# Patient Record
Sex: Male | Born: 1960
Health system: Southern US, Community
[De-identification: ages and names within clinical notes are randomized; demographics above are authoritative.]

## PROBLEM LIST (undated history)

## (undated) ENCOUNTER — Ambulatory Visit (HOSPITAL_COMMUNITY): Admission: EM | Source: Home / Self Care

## (undated) DIAGNOSIS — E119 Type 2 diabetes mellitus without complications: Secondary | ICD-10-CM

## (undated) DIAGNOSIS — E785 Hyperlipidemia, unspecified: Secondary | ICD-10-CM

## (undated) DIAGNOSIS — F17200 Nicotine dependence, unspecified, uncomplicated: Secondary | ICD-10-CM

## (undated) DIAGNOSIS — M549 Dorsalgia, unspecified: Secondary | ICD-10-CM

## (undated) DIAGNOSIS — R413 Other amnesia: Secondary | ICD-10-CM

## (undated) DIAGNOSIS — I10 Essential (primary) hypertension: Secondary | ICD-10-CM

## (undated) DIAGNOSIS — I779 Disorder of arteries and arterioles, unspecified: Secondary | ICD-10-CM

## (undated) HISTORY — DX: Nicotine dependence, unspecified, uncomplicated: F17.200

## (undated) HISTORY — DX: Dorsalgia, unspecified: M54.9

## (undated) HISTORY — DX: Disorder of arteries and arterioles, unspecified: I77.9

## (undated) HISTORY — DX: Other amnesia: R41.3

## (undated) HISTORY — DX: Hyperlipidemia, unspecified: E78.5

---

## 2001-02-19 ENCOUNTER — Emergency Department (HOSPITAL_COMMUNITY): Admission: EM | Admit: 2001-02-19 | Discharge: 2001-02-19 | Payer: Self-pay | Admitting: Emergency Medicine

## 2013-06-21 DIAGNOSIS — I779 Disorder of arteries and arterioles, unspecified: Secondary | ICD-10-CM

## 2013-06-21 HISTORY — DX: Disorder of arteries and arterioles, unspecified: I77.9

## 2015-09-07 ENCOUNTER — Encounter (HOSPITAL_COMMUNITY): Payer: Self-pay | Admitting: Emergency Medicine

## 2015-09-07 ENCOUNTER — Ambulatory Visit (HOSPITAL_COMMUNITY)
Admission: EM | Admit: 2015-09-07 | Discharge: 2015-09-07 | Disposition: A | Discharge status: Home / Self Care | Payer: PRIVATE HEALTH INSURANCE | Attending: Family Medicine | Admitting: Family Medicine

## 2015-09-07 DIAGNOSIS — S39012A Strain of muscle, fascia and tendon of lower back, initial encounter: Secondary | ICD-10-CM | POA: Diagnosis not present

## 2015-09-07 MED ORDER — METAXALONE 800 MG PO TABS: 800.0000 mg | ORAL_TABLET | Freq: Three times a day (TID) | ORAL | Status: DC

## 2015-09-07 NOTE — ED Notes (Signed)
Patient complaining of left mid to lower back pain.  No known injury.  Patient describes a lot of lifting at work.  Denies urinary symptoms.  No pcp

## 2015-09-07 NOTE — ED Provider Notes (Signed)
CSN: [id number]     Arrival date & time 09/07/15  1133 History   First MD Initiated Contact with Patient 09/07/15 1302     Chief Complaint  Patient presents with  . Back Pain   (Consider location/radiation/quality/duration/timing/severity/associated sxs/prior Treatment) Patient is a 55 y.o. male presenting with back pain. The history is provided by the patient and the spouse.  Back Pain Location:  Lumbar spine Quality:  Shooting Radiates to:  Does not radiate Pain severity:  Moderate Onset quality:  Gradual Duration:  1 month Progression:  Waxing and waning Chronicity:  Recurrent Context: lifting heavy objects   Relieved by:  None tried Worsened by:  Nothing tried Ineffective treatments:  None tried Associated symptoms: no abdominal pain, no dysuria, no fever, no numbness, no paresthesias, no pelvic pain, no perianal numbness and no tingling     History reviewed. No pertinent past medical history. History reviewed. No pertinent past surgical history. No family history on file. Social History  Substance Use Topics  . Smoking status: Current Every Day Smoker  . Smokeless tobacco: None  . Alcohol Use: No    Review of Systems  Constitutional: Negative for fever.  Gastrointestinal: Negative.  Negative for abdominal pain.  Genitourinary: Negative.  Negative for dysuria and pelvic pain.  Musculoskeletal: Positive for myalgias and back pain. Negative for joint swelling and gait problem.  Skin: Negative.   Neurological: Negative for tingling, numbness and paresthesias.  All other systems reviewed and are negative.   Allergies  Aspirin  Home Medications   Prior to Admission medications   Medication Sig Start Date End Date Taking? Authorizing Provider  OVER THE COUNTER MEDICATION Some beverage named "stackers, for energy"   Yes Historical Provider, MD  metaxalone (SKELAXIN) 800 MG tablet Take 1 tablet (800 mg total) by mouth 3 (three) times daily. For back pain 09/07/15    Linna HoffJames D Annalaura Sauseda, MD   Meds Ordered and Administered this Visit  Medications - No data to display  BP 147/82 mmHg  Pulse 67  Temp(Src) 98.6 F (37 C) (Oral)  Resp 16  SpO2 99% No data found.   Physical Exam  Constitutional: He is oriented to person, place, and time. He appears well-developed and well-nourished. No distress.  Abdominal: Soft. Bowel sounds are normal. He exhibits no mass. There is no tenderness.  Musculoskeletal: He exhibits tenderness.       Lumbar back: He exhibits tenderness, pain and spasm. He exhibits no bony tenderness, no swelling, no deformity and normal pulse.  Neurological: He is alert and oriented to person, place, and time.  Skin: Skin is warm and dry.  Nursing note and vitals reviewed.   ED Course  Procedures (including critical care time)  Labs Review Labs Reviewed - No data to display  Imaging Review No results found.   Visual Acuity Review  Right Eye Distance:   Left Eye Distance:   Bilateral Distance:    Right Eye Near:   Left Eye Near:    Bilateral Near:         MDM   1. Low back strain, initial encounter        Linna HoffJames D Jaslene Marsteller, MD 09/07/15 1319

## 2015-09-07 NOTE — Discharge Instructions (Signed)
Heat to back and medicine as needed, see orthopedist if problem continues.

## 2016-06-21 HISTORY — PX: COLONOSCOPY: SHX174

## 2016-06-21 HISTORY — PX: NO PAST SURGERIES: SHX2092

## 2016-06-24 ENCOUNTER — Ambulatory Visit (INDEPENDENT_AMBULATORY_CARE_PROVIDER_SITE_OTHER): Payer: PRIVATE HEALTH INSURANCE | Admitting: Medical

## 2016-06-24 ENCOUNTER — Ambulatory Visit: Payer: Self-pay | Admitting: Medical

## 2016-06-24 ENCOUNTER — Encounter: Payer: Self-pay | Admitting: Medical

## 2016-06-24 VITALS — BP 130/86 | HR 64 | Ht 69.0 in | Wt 155.8 lb

## 2016-06-24 DIAGNOSIS — R634 Abnormal weight loss: Secondary | ICD-10-CM | POA: Diagnosis not present

## 2016-06-24 DIAGNOSIS — M79672 Pain in left foot: Secondary | ICD-10-CM | POA: Diagnosis not present

## 2016-06-24 DIAGNOSIS — Z125 Encounter for screening for malignant neoplasm of prostate: Secondary | ICD-10-CM | POA: Insufficient documentation

## 2016-06-24 DIAGNOSIS — I8390 Asymptomatic varicose veins of unspecified lower extremity: Secondary | ICD-10-CM | POA: Diagnosis not present

## 2016-06-24 DIAGNOSIS — M79605 Pain in left leg: Secondary | ICD-10-CM | POA: Diagnosis not present

## 2016-06-24 DIAGNOSIS — R0989 Other specified symptoms and signs involving the circulatory and respiratory systems: Secondary | ICD-10-CM

## 2016-06-24 DIAGNOSIS — F17200 Nicotine dependence, unspecified, uncomplicated: Secondary | ICD-10-CM

## 2016-06-24 LAB — CBC WITH DIFFERENTIAL/PLATELET
Basophils Absolute: 0 cells/uL (ref 0–200)
Basophils Relative: 0 %
EOS ABS: 80 {cells}/uL (ref 15–500)
Eosinophils Relative: 1 %
HEMATOCRIT: 40.6 % (ref 38.5–50.0)
Hemoglobin: 14.3 g/dL (ref 13.2–17.1)
LYMPHS PCT: 17 %
Lymphs Abs: 1360 cells/uL (ref 850–3900)
MCH: 29.5 pg (ref 27.0–33.0)
MCHC: 35.2 g/dL (ref 32.0–36.0)
MCV: 83.7 fL (ref 80.0–100.0)
MONO ABS: 480 {cells}/uL (ref 200–950)
MPV: 9.6 fL (ref 7.5–12.5)
Monocytes Relative: 6 %
NEUTROS PCT: 76 %
Neutro Abs: 6080 cells/uL (ref 1500–7800)
Platelets: 217 10*3/uL (ref 140–400)
RBC: 4.85 MIL/uL (ref 4.20–5.80)
RDW: 14.1 % (ref 11.0–15.0)
WBC: 8 10*3/uL (ref 4.0–10.5)

## 2016-06-24 NOTE — Patient Instructions (Signed)
Encounter Diagnoses  Name Primary?  Marland Kitchen. Foot pain, left Yes  . Left leg pain   . Abnormal weight loss   . Heavy smoker   . Varicose vein of leg   . Decreased pedal pulses   . Screening for prostate cancer     Foot and leg pain  I suspect your blood flow to the left leg is reduced causing pain  However the pain could be nerve damage related or related to varicose veins/phlebitis  I do recommend you continue routine walking for exercise  I STRONGLY RECOMMEND YOU QUIT SMOKING!!!  Unintentional weight loss  When someone is losing weight for no good reason, we get a little worried  Thus I want to send you for a chest xray and do some labs to help screen for cause of weight loss  We will call with lab results, and we will schedule the blood pressure study in the legs

## 2016-06-24 NOTE — Progress Notes (Signed)
Subjective:     Patient ID: Benjamin [name], male   DOB: 1960-06-04, 56 y.o.   MRN: [id number]  HPI  Chief Complaint  Patient presents with  . leg pain    leg pain at night  , x3 months   Here as a new patient.  Hasn't been to a doctor in about 10 years.    Here mainly for pains in left leg.   When he lies down gets strain sensation in leg.  When he lies down at night, has to jump up out of bed.    Right leg doesn't give him problems, but he does have injury where he dropped something on the right foot years ago.  When walking does ok, but when still like at bedtime, gets pains throughout the night.  Sometimes burning/hot sensation.  Mostly ache/pain in calf and foot.   Sometimes the veins can be red.  He smokes 2ppd for at least the last 5 years.   No injury or trauma to left leg.  Uses OTC Doan? Pain medication for the pain.  Sometimes left foot feels numb.  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  Works in [employer].  Denies alcohol use and no hx/o alcohol abuse.   Lives with his sister who has MS.   She does not smoke.    He also notes unexplained 15 lb weight loss in last 4 months.  No blood in stool or urine, no dyspnea, no hemoptysis,no fever.  Eats 2-3 meals daily.   Past Medical History:  Diagnosis Date  . Back pain   . Heavy smoker     Past Surgical History:  Procedure Laterality Date  . COLONOSCOPY  06/2016   never  . NO PAST SURGERIES  06/2016    Social History   Social History  . Marital status: Single    Spouse name: N/A  . Number of children: N/A  . Years of education: N/A   Occupational History  . Not on file.   Social History Main Topics  . Smoking status: Current Every Day Smoker    Packs/day: 2.00  . Smokeless tobacco: Never Used  . Alcohol use No  . Drug use: No  . Sexual activity: Not on file   Other Topics Concern  . Not on file   Social History Narrative  . No narrative on file    Family History  Problem Relation Age of Onset  .  Arthritis Mother   . COPD Father   . Stroke Father   . Multiple sclerosis Sister      Current Outpatient Prescriptions:  .  metaxalone (SKELAXIN) 800 MG tablet, Take 1 tablet (800 mg total) by mouth 3 (three) times daily. For back pain (Patient not taking: Reported on 06/24/2016), Disp: 30 tablet, Rfl: 0  Allergies  Allergen Reactions  . Aspirin Nausea And Vomiting    Review of Systems     Objective:   Physical Exam BP 130/86   Pulse 64   Ht 5\' 9"  (1.753 m)   Wt 155 lb 12.8 oz (70.7 kg)   SpO2 97%   BMI 23.01 kg/m     General appearance: alert, no distress, WD/WN, lean AA male HEENT: normocephalic, sclerae anicteric, PERRLA, EOMi, nares patent, no discharge or erythema, pharynx normal Oral cavity: MMM Neck: supple, no lymphadenopathy, no thyromegaly, no masses, no bruits Heart: RRR, normal S1, S2, no murmurs Lungs: decreased breath sounds, no wheezes, rhonchi, or rales Abdomen: +bs, soft, non  tender, non distended, no masses, no hepatomegaly, no splenomegaly, no bruits Back: non tender Musculoskeletal: mild tenders of left calve and left dorsal foot, tender left toes throughout, no swelling, no specific deformity, otherwise legs and arms non tender, no swelling, no obvious deformity Extremities: no edema, no cyanosis, no clubbing, but decreased left cap refill and unable to get pulse ox readings of left toes, moderate varicose veins bilat LE Pulses: 2+ symmetric, upper extremities, barely 1+ pedal pulses, good femoral pulses Neurological: alert, oriented x 3, CN2-12 intact, strength normal upper extremities and lower extremities, sensation normal throughout, DTRs 2+ throughout, no cerebellar signs, gait normal Psychiatric: normal affect, behavior normal, pleasant      Assessment:     Encounter Diagnoses  Name Primary?  Marland Kitchen Foot pain, left Yes  . Left leg pain   . Abnormal weight loss   . Heavy smoker   . Varicose vein of leg   . Decreased pedal pulses   . Screening  for prostate cancer        Plan:     Discussed his symptoms and concerns.   I am worried about the unintentional weight loss in a heavy smoker and with decrease pulses left lower leg.    Foot and leg pain  I suspect your blood flow to the left leg is reduced causing pain  However the pain could be nerve damage related or related to varicose veins/phlebitis  I do recommend you continue routine walking for exercise  Order: Labs and ABIs  I STRONGLY RECOMMEND YOU QUIT SMOKING!!!  Unintentional weight loss  When someone is losing weight for no good reason, we get a little worried  Thus I want to send you for a chest xray and do some labs to help screen for cause of weight loss  We will pursue labs today  PSA screen today  We will call with lab results, and we will schedule the blood pressure study in the legs   Benjamin [name] was seen today for leg pain.  Diagnoses and all orders for this visit:  Foot pain, left -     Comprehensive metabolic panel -     CBC with Differential/Platelet -     Lipid panel -     VAS [name] ABI WITH/WO TBI; Future -     Pulse oximetry (single); Future  Left leg pain -     Comprehensive metabolic panel -     CBC with Differential/Platelet -     Lipid panel -     VAS [name] ABI WITH/WO TBI; Future -     Pulse oximetry (single); Future  Abnormal weight loss -     DG Chest 2 View; Future -     Comprehensive metabolic panel -     CBC with Differential/Platelet -     PSA  Heavy smoker -     DG Chest 2 View; Future -     Comprehensive metabolic panel -     CBC with Differential/Platelet -     Lipid panel -     VAS [name] ABI WITH/WO TBI; Future -     Pulse oximetry (single); Future  Varicose vein of leg -     Comprehensive metabolic panel -     CBC with Differential/Platelet -     VAS [name] ABI WITH/WO TBI; Future -     VAS [name] LOWER EXTREMITY ARTERIAL DUPLEX; Future  Decreased pedal pulses -     Comprehensive metabolic panel -  CBC with  Differential/Platelet -     Lipid panel -     VAS US ABI WITH/WO TBI; Future  Screening for prostate cancer -     Comprehensive metabolic panel -     CBC with Differential/Platelet -     PSA  Spent > 45 minutes face to face with patient in discussion of symptoms, evaluation, plan and recommendations.

## 2016-06-25 LAB — PSA: PSA: 0.3 ng/mL (ref <=4.0)

## 2016-06-25 LAB — COMPREHENSIVE METABOLIC PANEL
ALT: 12 U/L (ref 9–46)
AST: 22 U/L (ref 10–35)
Albumin: 4.2 g/dL (ref 3.6–5.1)
Alkaline Phosphatase: 89 U/L (ref 40–115)
BUN: 15 mg/dL (ref 7–25)
CHLORIDE: 106 mmol/L (ref 98–110)
CO2: 25 mmol/L (ref 20–31)
CREATININE: 1.1 mg/dL (ref 0.70–1.33)
Calcium: 9 mg/dL (ref 8.6–10.3)
Glucose, Bld: 81 mg/dL (ref 65–99)
Potassium: 3.9 mmol/L (ref 3.5–5.3)
SODIUM: 142 mmol/L (ref 135–146)
TOTAL PROTEIN: 6.6 g/dL (ref 6.1–8.1)
Total Bilirubin: 0.4 mg/dL (ref 0.2–1.2)

## 2016-06-25 LAB — LIPID PANEL
Cholesterol: 192 mg/dL (ref <200)
HDL: 59 mg/dL (ref >40)
LDL Cholesterol: 107 mg/dL — ABNORMAL HIGH (ref <100)
Total CHOL/HDL Ratio: 3.3 Ratio (ref <5.0)
Triglycerides: 128 mg/dL (ref <150)
VLDL: 26 mg/dL (ref <30)

## 2016-06-27 ENCOUNTER — Other Ambulatory Visit: Payer: Self-pay

## 2016-06-27 ENCOUNTER — Ambulatory Visit
Admission: RE | Admit: 2016-06-27 | Discharge: 2016-06-27 | Disposition: A | Discharge status: Home / Self Care | Payer: PRIVATE HEALTH INSURANCE | Source: Ambulatory Visit | Attending: Medical | Admitting: Medical

## 2016-06-27 DIAGNOSIS — R634 Abnormal weight loss: Secondary | ICD-10-CM

## 2016-06-27 DIAGNOSIS — F17200 Nicotine dependence, unspecified, uncomplicated: Secondary | ICD-10-CM

## 2016-06-28 ENCOUNTER — Ambulatory Visit (HOSPITAL_COMMUNITY)
Admission: RE | Admit: 2016-06-28 | Discharge: 2016-06-28 | Disposition: A | Discharge status: Home / Self Care | Payer: PRIVATE HEALTH INSURANCE | Source: Ambulatory Visit | Attending: Medical | Admitting: Medical

## 2016-06-28 DIAGNOSIS — I771 Stricture of artery: Secondary | ICD-10-CM | POA: Insufficient documentation

## 2016-06-28 DIAGNOSIS — I8390 Asymptomatic varicose veins of unspecified lower extremity: Secondary | ICD-10-CM

## 2016-06-28 DIAGNOSIS — R0989 Other specified symptoms and signs involving the circulatory and respiratory systems: Secondary | ICD-10-CM

## 2016-06-28 DIAGNOSIS — F17200 Nicotine dependence, unspecified, uncomplicated: Secondary | ICD-10-CM | POA: Diagnosis not present

## 2016-06-28 DIAGNOSIS — M79605 Pain in left leg: Secondary | ICD-10-CM | POA: Diagnosis not present

## 2016-06-28 DIAGNOSIS — M79672 Pain in left foot: Secondary | ICD-10-CM | POA: Diagnosis present

## 2016-06-28 NOTE — Progress Notes (Signed)
VASCULAR LAB PRELIMINARY  ARTERIAL  ABI completed:    RIGHT    LEFT    PRESSURE WAVEFORM  PRESSURE WAVEFORM  BRACHIAL 181 Tri BRACHIAL 155 Tri  DP 84 Mono DP  Not audible  PT 121 Mono PT  Not audible  GREAT TOE 135 NA GREAT TOE  NA    RIGHT LEFT  ABI .67    The right extremity ABI indicates moderate peripheral arterial disease with monophasic waveforms.  The left ABI could not be obtained.  Benjamin FischerCharlotte C Brindley [name], RVT 06/28/2016, 3:45 PM

## 2016-06-28 NOTE — Progress Notes (Addendum)
*  PRELIMINARY RESULTS* Vascular Ultrasound Lower Extremity Arterial Duplex has been completed.  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 Collateral flow noted and monophasic waveforms seen in PTA, Pero and ATA at ankle.  The left common and superficial femoral, popliteal arteries appear chronically occluded. Acute thrombosis vs soft plaque seen in the left external iliac artery.  Unable to obtain arterial signal in the left distal calf arteries and foot. Incidental finding: right sided bakers cyst.  Preliminary report called to Jalin Tysinger, PA. Spoke withCrosby Oyster Dr. Leanna BattlesMalone @ 15:45. Patient here waiting for further instructions.  Chauncey FischerCharlotte C Saahir Prude 06/28/2016, 3:50 PM

## 2016-06-29 ENCOUNTER — Telehealth: Payer: Self-pay | Admitting: Medical

## 2016-06-29 ENCOUNTER — Encounter: Payer: Self-pay | Admitting: Surgery

## 2016-06-29 ENCOUNTER — Telehealth: Payer: Self-pay | Admitting: Surgery

## 2016-06-29 NOTE — Telephone Encounter (Signed)
-----   Message from Sharee PimpleMarilyn K McChesney, RN sent at 06/29/2016 10:01 AM EDT ----- Regarding: RE: Monday Appt with Brabham It's per him so I would put it on his schedule, with his initials all over it. Thanks  ----- Message ----- From: Salvadore OxfordGraves, Barbara E Sent: 06/29/2016   9:56 AM To: Sharee PimpleMarilyn K McChesney, RN Subject: RE: Monday Appt with Brabham                   There is no one to move from [facility] on Monday, PA or NP to see or overbook [facility]?    ----- Message ----- From: Sharee PimpleMcChesney, Marilyn K, RN Sent: 06/28/2016  10:51 PM To: [facility] Subject: Monday Appt with Myra GianottiBrabham                         ----- Message ----- From: Nada LibmanBrabham, Vance W, MD Sent: 06/28/2016   4:34 PM To: [facility] Charge Pool  Please schedule this patient to see me on this Monday for a subacute ischemic leg.  No studies prior.  We will need to contact the patient for details of visit.  Referring MD is Dr. Susann GivensLaLonde.  Thanks

## 2016-06-29 NOTE — Telephone Encounter (Signed)
Spoke to pt on home #, he may have to work Monday afternoon and may call back to res, gave appt address and info for 07/04/16

## 2016-06-29 NOTE — Telephone Encounter (Signed)
Made pt an appt to come to see you on Friday

## 2016-06-29 NOTE — Telephone Encounter (Signed)
Dr. Susann GivensLalonde got call report on his US yesterday with significant vascular disease.  Vascular surgery is contacting him for appt urgently.  In the meantime, get him in for appt with me this week if not already scheduled ( I didn't see telephone message about this).   We need to discuss his weight loss, other concerns, and recheck the leg to make sure not much worse in the short term

## 2016-06-30 LAB — VAS US ABI WITH/WO TBI
LANTTIBPRDYS: 10 cm/s
LEFT PERO DIST DIA: 17 cm/s
LEFT PERO DIST SYS: 25 cm/s
LEFT PERO MID DIA: 15 cm/s
LPEROMIDSYS: 21 cm/s
Left ant tibial sys PSV: 16 cm/s
Left peroneal sys PSV: 19 cm/s
Left peroneal sys min: 15 m/s
RATIBMIDDIA: 4 cm/s
RIGHT ANT DIST TIBAL DIA PSV: 5 cm/s
RIGHT ANT DIST TIBAL SYS PSV: 20 cm/s
RIGHT ANT MID TIBIAL SYS PSV: 16 cm/s
RIGHT ANT TIBIAL EDV: -5 cm/s
RIGHT PERO MID DIA: 9 cm/s
RIGHT PERO MID SYS: 19 cm/s
RPERPSV: 14 cm/s
RPTIBPSV: 37 cm/s
RTIBMIDDIA: 7 cm/s
RTIBMIDSYS: 21 cm/s
RTPOSTTIBDIA: 9 cm/s
Right ant tibeal sys PSV: -18 cm/s
Right peroneal sys min: 6 m/s

## 2016-07-01 ENCOUNTER — Institutional Professional Consult (permissible substitution): Payer: PRIVATE HEALTH INSURANCE | Admitting: Medical

## 2016-07-01 ENCOUNTER — Telehealth: Payer: Self-pay

## 2016-07-01 NOTE — Telephone Encounter (Signed)
Call, as we really needed to have him come in due to weight loss and POOR circulation in legs to discuss next steps

## 2016-07-01 NOTE — Telephone Encounter (Signed)
Called pt. His voice mail is full.

## 2016-07-01 NOTE — Telephone Encounter (Signed)

## 2016-07-04 ENCOUNTER — Encounter: Payer: Self-pay | Admitting: Medical

## 2016-07-04 ENCOUNTER — Other Ambulatory Visit: Payer: Self-pay | Admitting: *Deleted

## 2016-07-04 ENCOUNTER — Ambulatory Visit (INDEPENDENT_AMBULATORY_CARE_PROVIDER_SITE_OTHER): Payer: PRIVATE HEALTH INSURANCE | Admitting: Surgery

## 2016-07-04 ENCOUNTER — Encounter: Payer: Self-pay | Admitting: *Deleted

## 2016-07-04 ENCOUNTER — Encounter: Payer: Self-pay | Admitting: Surgery

## 2016-07-04 ENCOUNTER — Telehealth: Payer: Self-pay

## 2016-07-04 ENCOUNTER — Ambulatory Visit (INDEPENDENT_AMBULATORY_CARE_PROVIDER_SITE_OTHER): Payer: PRIVATE HEALTH INSURANCE | Admitting: Medical

## 2016-07-04 VITALS — BP 140/92 | HR 64 | Temp 98.4°F | Resp 16 | Ht 69.0 in | Wt 156.0 lb

## 2016-07-04 VITALS — BP 124/78 | HR 65 | Wt 156.2 lb

## 2016-07-04 DIAGNOSIS — F17200 Nicotine dependence, unspecified, uncomplicated: Secondary | ICD-10-CM

## 2016-07-04 DIAGNOSIS — R0989 Other specified symptoms and signs involving the circulatory and respiratory systems: Secondary | ICD-10-CM

## 2016-07-04 DIAGNOSIS — I8390 Asymptomatic varicose veins of unspecified lower extremity: Secondary | ICD-10-CM

## 2016-07-04 DIAGNOSIS — R938 Abnormal findings on diagnostic imaging of other specified body structures: Secondary | ICD-10-CM | POA: Diagnosis not present

## 2016-07-04 DIAGNOSIS — R9389 Abnormal findings on diagnostic imaging of other specified body structures: Secondary | ICD-10-CM

## 2016-07-04 DIAGNOSIS — R634 Abnormal weight loss: Secondary | ICD-10-CM

## 2016-07-04 DIAGNOSIS — M79672 Pain in left foot: Secondary | ICD-10-CM | POA: Diagnosis not present

## 2016-07-04 DIAGNOSIS — I70213 Atherosclerosis of native arteries of extremities with intermittent claudication, bilateral legs: Secondary | ICD-10-CM | POA: Diagnosis not present

## 2016-07-04 DIAGNOSIS — I739 Peripheral vascular disease, unspecified: Secondary | ICD-10-CM | POA: Diagnosis not present

## 2016-07-04 MED ORDER — PRAVASTATIN SODIUM 20 MG PO TABS: 20.0000 mg | ORAL_TABLET | Freq: Every day | ORAL | 1 refills | Status: DC

## 2016-07-04 MED ORDER — BUPROPION HCL ER (SR) 150 MG PO TB12: 150.0000 mg | ORAL_TABLET | Freq: Every day | ORAL | 1 refills | Status: DC

## 2016-07-04 MED ORDER — NICOTINE 21 MG/24HR TD PT24: 21.0000 mg | MEDICATED_PATCH | Freq: Every day | TRANSDERMAL | 0 refills | Status: DC

## 2016-07-04 NOTE — Telephone Encounter (Signed)
Pt had an appt. With [facility].

## 2016-07-04 NOTE — Progress Notes (Signed)
[facility]  Patient name: Benjamin [name] MRN: [id number] DOB: 1960-10-20 Sex: male   REFERRING PROVIDER:    Dr. Susann Givens   REASON FOR CONSULT:    claudication  HISTORY OF PRESENT ILLNESS:   CLEARANCE CHENAULT is a 56 y.o. male, who is Referred today for leg pain.  He underwent a ultrasound last week that showed severe lower history vascular disease.  The patient states he has been having trouble with his leg pain for approximately one month.  He states that he can walk approximately one block before he gets pain and cramping in his legs.  He also states that he wakes up at night with pain in his left leg.  This is improved by hanging the leg over the bed and by getting up and walking.  He has to take a sleeping pill at night to help him fall asleep because of the pain.  He does not have any open wounds.  The patient has a long-term history of smoking.  He takes a statin for hypercholesterolemia.  PAST MEDICAL HISTORY    Past Medical History:  Diagnosis Date  . Back pain   . Heavy smoker      FAMILY HISTORY   Family History  Problem Relation Age of Onset  . Arthritis Mother   . COPD Father   . Stroke Father   . Multiple sclerosis Sister     SOCIAL HISTORY:   Social History   Social History  . Marital status: Single    Spouse name: N/A  . Number of children: N/A  . Years of education: N/A   Occupational History  . Not on file.   Social History Main Topics  . Smoking status: Current Every Day Smoker    Packs/day: 1.50  . Smokeless tobacco: Never Used  . Alcohol use No  . Drug use: No  . Sexual activity: Not on file   Other Topics Concern  . Not on file   Social History Narrative  . No narrative on file    ALLERGIES:    Allergies  Allergen Reactions  . Aspirin Nausea And Vomiting    CURRENT MEDICATIONS:    Current Outpatient Prescriptions  Medication Sig Dispense Refill  . buPROPion  (WELLBUTRIN SR) 150 MG 12 hr tablet Take 1 tablet (150 mg total) by mouth daily. 30 tablet 1  . nicotine (NICODERM CQ - DOSED IN MG/24 HOURS) 21 mg/24hr patch Place 1 patch (21 mg total) onto the skin daily. 28 patch 0  . pravastatin (PRAVACHOL) 20 MG tablet Take 1 tablet (20 mg total) by mouth daily. 90 tablet 1   No current facility-administered medications for this visit.     REVIEW OF SYSTEMS:   [X]  denotes positive finding, [ ]  denotes negative finding Cardiac  Comments:  Chest pain or chest pressure:    Shortness of breath upon exertion:    Short of breath when lying flat:    Irregular heart rhythm:        Vascular    Pain in calf, thigh, or hip brought on by ambulation: x   Pain in feet at night that wakes you up from your sleep:  x   Blood clot in your veins: x   Leg swelling:         Pulmonary    Oxygen at home:    Productive cough:     Wheezing:         Neurologic  Sudden weakness in arms or legs:     Sudden numbness in arms or legs:     Sudden onset of difficulty speaking or slurred speech:    Temporary loss of vision in one eye:     Problems with dizziness:         Gastrointestinal    Blood in stool:      Vomited blood:         Genitourinary    Burning when urinating:     Blood in urine:        Psychiatric    Major depression:         Hematologic    Bleeding problems:    Problems with blood clotting too easily:        Skin    Rashes or ulcers:        Constitutional    Fever or chills:     PHYSICAL EXAM:   Vitals:   07/04/16 1527  BP: (!) 140/92  Pulse: 64  Resp: 16  Temp: 98.4 F (36.9 C)  TempSrc: Oral  SpO2: 99%  Weight: 156 lb (70.8 kg)  Height: 5\' 9"  (1.753 m)    GENERAL: The patient is a well-nourished male, in no acute distress. The vital signs are documented above. CARDIAC: There is a regular rate and rhythm.  VASCULAR: Nonpalpable pedal pulses PULMONARY: Nonlabored respirations MUSCULOSKELETAL: There are no major  deformities or cyanosis. NEUROLOGIC: No focal weakness or paresthesias are detected. SKIN: There are no ulcers or rashes noted. PSYCHIATRIC: The patient has a normal affect.  STUDIES:   I have reviewed his Doppler studies with the following findings: 1. 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 Collateral flow noted and monophasic    waveforms seen in PTA, Pero and ATA at ankle. The left common    and superficial femoral, popliteal arteries appear chronically    occluded. Acute thrombosis vs soft plaque seen in the left    external iliac artery. Unable to obtain arterial signal in the    left distal calf arteries and foot.  ASSESSMENT and PLAN   The patient has borderline rest pain in his left leg.  I have discussed proceeding with angiography to better define his anatomy and see what his treatment options are.  I will plan on cannulation of the right groin and intervention on the left leg if feasible.  This is been scheduled for Tuesday, May 22.  I stressed the importance of smoking cessation.  We discussed the risks and benefits of the procedure.  I answered all of his questions today.   Durene CalWells Sigismund Cross, MD [facility] Tel [phone] Pager [phone]

## 2016-07-04 NOTE — Progress Notes (Signed)
Subjective: Chief Complaint  Patient presents with  . Follow-up    follow up results    Here for f/u.  I saw him recently as a new patient for pain in foot and leg (left) as well as recent unexplained weight loss.   He is here for f/u on Chest xray, labs and ABIs that were abnormal.  He has smoked up to 2ppd prior, long hx/o tobacco use.   He only recently had some weight loss in the last few months.   He otherwise hasn't had routine healthy care in years.  No hx/o colonoscopy or other cancer screens.  He has no other c/o or other new concerns today. No other aggravating or relieving factors. No other complaint.  Past Medical History:  Diagnosis Date  . Back pain   . Heavy smoker    No current outpatient prescriptions on file prior to visit.   No current facility-administered medications on file prior to visit.    ROS as in subjective   Objective: BP 124/78   Pulse 65   Wt 156 lb 3.2 oz (70.9 kg)   SpO2 95%   BMI 23.07 kg/m   Wt Readings from Last 3 Encounters:  07/04/16 156 lb (70.8 kg)  07/04/16 156 lb 3.2 oz (70.9 kg)  06/24/16 155 lb 12.8 oz (70.7 kg)    General appearance: alert, no distress, WD/WN,  Neck: supple, no lymphadenopathy, no thyromegaly, no masses Heart: RRR, normal S1, S2, no murmurs Lungs: CTA bilaterally, no wheezes, rhonchi, or rales Abdomen: +bs, soft, non tender, non distended, no masses, no hepatomegaly, no splenomegaly Pulses: 2+ symmetric, upper and lower extremities, normal cap refill Back: non tender Musculoskeletal: mild tenders of left calve and left dorsal foot, tender left toes throughout, no swelling, no specific deformity, otherwise legs and arms non tender, no swelling, no obvious deformity Extremities: no edema, no cyanosis, no clubbing, but decreased left cap refill and unable to get pulse ox readings of left toes, moderate varicose veins bilat LE Pulses: 2+ symmetric, upper extremities, barely 1+ pedal pulses, good femoral  pulses Neurological: alert, oriented x 3, CN2-12 intact, strength normal upper extremities and lower extremities, sensation normal throughout, DTRs 2+ throughout, no cerebellar signs, gait normal    Assessment: Encounter Diagnoses  Name Primary?  . Peripheral vascular disease (HCC) Yes  . Foot pain, left   . Decreased pedal pulses   . Abnormal weight loss   . Varicose vein of leg   . Heavy smoker   . Abnormal chest x-ray      Plan: Discussed his concerns, symptoms, recent tests.   Discussed diagnosis of PVD as this is likely the cause of his foot and leg pain.  His recent ABIs were certainly abnormal .  He was suppose to see vascular surgeon this morning but got his appt confused.  We were able to get him worked in this afternoon for that appt.  He will need other intervention.  Dr. Susann Givens, supervising physician actually spoke to Dr. Myra Gianotti last week about his case when the results were called due to the findings.   Strongly advised smoking cessation, beginning statin and healthier diet, walking for exercise.   discussed the abnormal chest xray suggestive of COPD.  Possible complications discussed.  We will defer PFT for now since he is asymptomatic and the leg is the priority at this time.  Atherosclerosis - discussed diagnosis, findings on xray, and his significant tobacco use.  Strongly advised he stop tobacco and begin  statin.  He is agreeable to beginning medications for smoking cessation. Advised smoking cessation counseling.   Weight loss - reviewed recent labs including CBC, PSA, CXR.    Advised that we will focus on the leg for the moment with vascular surgery. However, if he loses any more weight in the next 2 weeks, then we would move forward with CT Chest/Abdomen/Pelvis to rule out tumor or other causes.  He is also in need of colonoscopy, but again, defer til after vascular consult unless further weight loss.   Benjamin [name] was seen today for follow-up.  Diagnoses and all  orders for this visit:  Peripheral vascular disease (HCC)  Foot pain, left  Decreased pedal pulses  Abnormal weight loss  Varicose vein of leg  Heavy smoker  Abnormal chest x-ray  Other orders -     Discontinue: pravastatin (PRAVACHOL) 20 MG tablet; Take 1 tablet (20 mg total) by mouth daily. -     pravastatin (PRAVACHOL) 20 MG tablet; Take 1 tablet (20 mg total) by mouth daily. -     nicotine (NICODERM CQ - DOSED IN MG/24 HOURS) 21 mg/24hr patch; Place 1 patch (21 mg total) onto the skin daily. -     buPROPion (WELLBUTRIN SR) 150 MG 12 hr tablet; Take 1 tablet (150 mg total) by mouth daily.

## 2016-07-04 NOTE — Telephone Encounter (Signed)
Pt called  with confusion about an appt today @ 1pm. I do not see any appointments schedules. Please call pt back at [phone]. Lynford Humphrey/Thanks, RLB

## 2016-07-04 NOTE — Patient Instructions (Addendum)
Encounter Diagnoses  Name Primary?  . Peripheral vascular disease (HCC) Yes  . Foot pain, left   . Decreased pedal pulses   . Abnormal weight loss   . Varicose vein of leg   . Heavy smoker   . Abnormal chest x-ray    Recommendations  Get rescheduled ASAP with the vein doctor /vascular surgery to come up with a plan to improve your blood flow in your legs  STOP SMOKING!!!   Begin medication to help stop smoking  I strongly recommmend you use counseling to help quit smoking.   There are quit smokign classes at [facility] or call 1-800-QUIT-NOW hotline to help quit smoking  Eat a healthy low fat diet  BEGIN a medication at bedtime called Pravachol to lower cholesterol     Atherosclerosis Atherosclerosis is narrowing and hardening of the blood vessels (arteries). Arteries are tubes that carry blood that contains oxygen from the heart to all parts of the body. Arteries can become narrow or clogged with a buildup of fat, cholesterol, calcium, or other substances (plaque). Plaque decreases the amount of blood that can flow through the artery. Atherosclerosis can affect any artery in the body, including:  Heart arteries (coronary artery disease), which may cause heart attack.  Brain arteries, which may cause stroke.  Leg, arm, and pelvis arteries (peripheral artery disease), which may cause pain and numbness.  Kidney arteries, which may cause kidney (renal) failure. Treatment may slow the disease and prevent further damage to the heart, brain, peripheral arteries, and kidneys. What are the causes? Atherosclerosis develops when plaque forms in an artery. This damages the inside wall of the artery. Over time, the plaque grows and hardens. It may break through the artery wall. This causes a blood clot to form over the break, which narrows the artery more. The clot may also break loose and travel to other arteries, causing more damage. What increases the risk? This condition is more  likely to develop in people who:  Are middle age or older.  Have a family history of atherosclerosis.  Have high cholesterol.  Have high blood fats (triglycerides).  Have diabetes.  Are overweight.  Smoke tobacco.  Do not exercise enough.  Have a substance in the blood that indicates increased levels of inflammation in the body (C-reactive protein, or CRP).  Have sleep apnea.  Are stressed.  Drink too much alcohol. What are the signs or symptoms? This condition may not cause any symptoms. If you do have symptoms, they are caused by damage to an area of your body that is not getting enough blood. The following symptoms are possible:  Coronary artery disease may cause chest pain and shortness of breath.  Decreased blood supply to your brain may cause a stroke. Signs and symptoms of stroke may include sudden:  Weakness on one side of the body.  Confusion.  Changes in vision.  Inability to speak or understand speech.  Loss of balance, coordination, or ability to walk.  Severe headache.  Loss of consciousness.  Peripheral artery disease may cause pain and numbness, often in the legs and hips.  Renal failure may cause fatigue, nausea, swelling, and itchy skin. How is this diagnosed? This condition is diagnosed based on your medical history and a physical exam. During the exam, your health care provider will check your pulses and listen for a "whooshing" sound over your arteries (bruit). You may have tests, such as:  Blood tests to check your levels of cholesterol, triglycerides, and CRP.  Electrocardiogram (ECG) to check for heart damage.  Chest X-ray to see if your heart is enlarged, which is a sign of heart failure.  Stress test to see how your heart reacts to exercise.  Echocardiogram to get images of the inside of your heart.  Ankle-Brachial index to compare blood pressure in your arms to blood pressure in your ankles.  Ultrasound of your peripheral  arteries to check blood flow.  CT scan to check for damage to your heart or brain.  X-rays of blood vessels after dye has been injected (angiogram) to check blood flow. How is this treated? Treatment starts with lifestyle changes, which may include:  Changing your diet.  Losing weight.  Reducing stress.  Exercising and being more physically active.  Not smoking. You also may need medicine to:  Lower triglycerides and cholesterol.  Lower and control blood pressure.  Prevent blood clots.  Lower inflammation in your body.  Lower and control your blood sugar. Sometimes, surgery is needed to remove plaque, widen your artery, or create a new path for your blood (bypass). Surgical treatment may include:  Removing plaque from an artery (endarterectomy).  Opening a narrowed heart artery (angioplasty).  Heart or peripheral artery bypass graft surgery. Follow these instructions at home: Lifestyle    Eat a heart-healthy diet. Talk to your health care provider or a diet specialist (dietitian) if you need help. A heart-healthy diet includes:  Limiting unhealthy fats and increasing healthy fats. Some examples of healthy fats are olive oil and canola oil.  Eating plant-based foods, such as fruits, vegetables, nuts, legumes, and whole grains.  Follow an exercise program as told by your health care provider.  Maintain a healthy weight. Lose weight if directed by your health care provider.  Rest when you are tired.  Learn to manage your stress.  Do not use any tobacco products, such as cigarettes, chewing tobacco, and e-cigarettes. If you need help quitting, ask your health care provider.  Limit alcohol intake to no more than 1 drink a day for nonpregnant women and 2 drinks a day for men. One drink equals 12 oz of beer, 5 oz of wine, or 1 oz of hard liquor.  Do not abuse drugs. General instructions   Take over-the-counter and prescription medicines only as told by your  health care provider.  Manage other health conditions as told by your health care provider.  Keep all follow-up visits as told by your health care provider. This is important. Contact a health care provider if:  You have chest pain or discomfort. This includes squeezing chest pain that may feel like indigestion (angina).  You have shortness of breath.  You have an irregular heartbeat.  You have unexplained fatigue.  You have unexplained pain or numbness in an arm, leg, or hip.  You have nausea, swelling of your hands or feet, and itchy skin. Get help right away if:  You have symptoms of a heart attack, such as:  Chest pain.  Shortness of breath.  Pain in your neck, jaw, arms, back, or stomach.  Cold sweat.  Nausea.  Light-headedness.  You have symptoms of a stroke, such as sudden:  Weakness on one side of your body.  Confusion.  Changes in vision.  Inability to speak or understand speech.  Loss of balance, coordination, or ability to walk.  Severe headache.  Loss of consciousness. These symptoms may represent a serious problem that is an emergency. Do not wait to see if the symptoms will go  away. Get medical help right away. Call your local emergency services (911 in the [place] Do not drive yourself to the hospital. This information is not intended to replace advice given to you by your health care provider. Make sure you discuss any questions you have with your health care provider. Document Released: 04/30/2003 Document Revised: 07/16/2015 Document Reviewed: 12/29/2014 Elsevier Interactive Patient Education  2017 [employer]     Intermittent Claudication Intermittent claudication is pain in your leg that occurs when you walk or exercise and goes away when you rest. The pain can occur in one or both legs. What are the causes? Intermittent claudication is caused by the buildup of plaque within the major arteries in the body (atherosclerosis). The plaque,  which makes arteries stiff and narrow, prevents enough blood from reaching your leg muscles. The pain occurs when you walk or exercise because your muscles need more blood when you are moving and exercising. What increases the risk? Risk factors include:  A family history of atherosclerosis.  A personal history of stroke or heart disease.  Older age.  Being inactive or overweight.  Smoking cigarettes.  Having another health condition such as:  Diabetes.  High blood pressure.  High cholesterol. What are the signs or symptoms? Your hip or leg may:  Ache.  Cramp.  Feel tight.  Feel weak.  Feel heavy. Over time, you may feel pain in your calf, thigh, or hip. How is this diagnosed? Your health care provider may diagnose intermittent claudication based on your symptoms and medical history. Your health care provider may also do tests to learn more about your condition. These may include:  Blood tests.  An ultrasound.  Imaging tests such as angiography, magnetic resonance angiography (MRA), and computed tomography angiography (CTA). How is this treated? You may be treated for problems such as:  High blood pressure.  High cholesterol.  Diabetes. Other treatments may include:  Lifestyle changes such as:  Starting an exercise program.  Losing weight.  Quitting smoking.  Medicines to help restore blood flow through your legs.  Blood vessel surgery (angioplasty) to restore blood flow if your intermittent claudication is caused by severe peripheral artery disease. Follow these instructions at home:  Manage any other health conditions you have.  Eat a diet low in saturated fats and calories to maintain a healthy weight.  Quit smoking, if you smoke.  Take medicines only as directed by your health care provider.  If your health care provider recommended an exercise program for you, follow it as directed. Your exercise program may involve:  Walking three or  more times a week.  Walking until you have certain symptoms of intermittent claudication.  Resting until symptoms go away.  Gradually increasing walking time to about 50 minutes a day. Contact a health care provider if: Your condition is not getting better or is getting worse. Get help right away if:  You have chest pain.  You have difficulty breathing.  You develop arm weakness.  You have trouble speaking.  Your face begins to droop. This information is not intended to replace advice given to you by your health care provider. Make sure you discuss any questions you have with your health care provider. Document Released: 12/11/2003 Document Revised: 07/16/2015 Document Reviewed: 05/16/2013 Elsevier Interactive Patient Education  2017 [employer]vier Inc.     Chronic Obstructive Pulmonary Disease Chronic obstructive pulmonary disease (COPD) is a long-term (chronic) lung problem. When you have COPD, it is hard for air to get in  and out of your lungs. The way your lungs work will never return to normal. Usually the condition gets worse over time. There are things you can do to keep yourself as healthy as possible. Your doctor may treat your condition with:  Medicines.  Quitting smoking, if you smoke.  Rehabilitation. This may involve a team of specialists.  Oxygen.  Exercise and changes to your diet.  Lung surgery.  Comfort measures (palliative care). Follow these instructions at home: Medicines   Take over-the-counter and prescription medicines only as told by your doctor.  Talk to your doctor before taking any cough or allergy medicines. You may need to avoid medicines that cause your lungs to be dry. Lifestyle   If you smoke, stop. Smoking makes the problem worse. If you need help quitting, ask your doctor.  Avoid being around things that make your breathing worse. This may include smoke, chemicals, and fumes.  Stay active, but remember to also rest.  Learn and use  tips on how to relax.  Make sure you get enough sleep. Most adults need at least 7 hours a night.  Eat healthy foods. Eat smaller meals more often. Rest before meals. Controlled breathing   Learn and use tips on how to control your breathing as told by your doctor. Try:  Breathing in (inhaling) through your nose for 1 second. Then, pucker your lips and breath out (exhale) through your lips for 2 seconds.  Putting one hand on your belly (abdomen). Breathe in slowly through your nose for 1 second. Your hand on your belly should move out. Pucker your lips and breathe out slowly through your lips. Your hand on your belly should move in as you breathe out. Controlled coughing   Learn and use controlled coughing to clear mucus from your lungs. The steps are: 1. Lean your head a little forward. 2. Breathe in deeply. 3. Try to hold your breath for 3 seconds. 4. Keep your mouth slightly open while coughing 2 times. 5. Spit any mucus out into a tissue. 6. Rest and do the steps again 1 or 2 times as needed. General instructions   Make sure you get all the shots (vaccines) that your doctor recommends. Ask your doctor about a flu shot and a pneumonia shot.  Use oxygen therapy and therapy to help improve your lungs (pulmonary rehabilitation) if told by your doctor. If you need home oxygen therapy, ask your doctor if you should buy a tool to measure your oxygen level (oximeter).  Make a COPD action plan with your doctor. This helps you know what to do if you feel worse than usual.  Manage any other conditions you have as told by your doctor.  Avoid going outside when it is very hot, cold, or humid.  Avoid people who have a sickness you can catch (contagious).  Keep all follow-up visits as told by your doctor. This is important. Contact a doctor if:  You cough up more mucus than usual.  There is a change in the color or thickness of the mucus.  It is harder to breathe than usual.  Your  breathing is faster than usual.  You have trouble sleeping.  You need to use your medicines more often than usual.  You have trouble doing your normal activities such as getting dressed or walking around the house. Get help right away if:  You have shortness of breath while resting.  You have shortness of breath that stops you from:  Being able to  talk.  Doing normal activities.  Your chest hurts for longer than 5 minutes.  Your skin color is more blue than usual.  Your pulse oximeter shows that you have low oxygen for longer than 5 minutes.  You have a fever.  You feel too tired to breathe normally. Summary  Chronic obstructive pulmonary disease (COPD) is a long-term lung problem.  The way your lungs work will never return to normal. Usually the condition gets worse over time. There are things you can do to keep yourself as healthy as possible.  Take over-the-counter and prescription medicines only as told by your doctor.  If you smoke, stop. Smoking makes the problem worse. This information is not intended to replace advice given to you by your health care provider. Make sure you discuss any questions you have with your health care provider. Document Released: 07/27/2007 Document Revised: 07/16/2015 Document Reviewed: 10/04/2012 Elsevier Interactive Patient Education  2017 [employer].

## 2016-07-05 ENCOUNTER — Telehealth: Payer: Self-pay

## 2016-07-05 ENCOUNTER — Other Ambulatory Visit: Payer: Self-pay | Admitting: Medical

## 2016-07-05 MED ORDER — NICOTINE 21 MG/24HR TD PT24: 21.0000 mg | MEDICATED_PATCH | Freq: Every day | TRANSDERMAL | 0 refills | Status: DC

## 2016-07-05 NOTE — Telephone Encounter (Signed)
Pt called said that he can't afford the nicoderm the sent and wanted to know if he can have something cheaper.

## 2016-07-05 NOTE — Telephone Encounter (Signed)
I called the pt the pt said it was the nicoderm

## 2016-07-05 NOTE — Telephone Encounter (Signed)
Pt called states that the nicotine patches are too expensive and would like an alternative called in. Trixie Rude/RLB

## 2016-07-05 NOTE — Telephone Encounter (Signed)
Was it the Nicoderm or the Wellbutrin that was expensive?  I thought it was the Wellbutrin tablets?    Have him ask pharmacist if generic Nicotine patch (instead of nicorette brand)  or Bupropropin BID (Wellbutrin) tablet is cheaper than what I sent (Well butrin XR)?

## 2016-07-07 ENCOUNTER — Telehealth: Payer: Self-pay

## 2016-07-07 ENCOUNTER — Encounter: Payer: Self-pay | Admitting: Medical

## 2016-07-07 NOTE — Telephone Encounter (Signed)
Called and spoke with his sister to let her know that shane was out of the office the rest of the week, the note was put up able his medicine being to expensive.

## 2016-07-07 NOTE — Telephone Encounter (Signed)
[name] Bellino- pt sister called to discuss pt's medications. Please call her back at [phone]. Trixie Rude/rlb

## 2016-07-12 ENCOUNTER — Ambulatory Visit (HOSPITAL_COMMUNITY)
Admission: RE | Admit: 2016-07-12 | Discharge: 2016-07-12 | Disposition: A | Discharge status: Home / Self Care | Payer: PRIVATE HEALTH INSURANCE | Source: Ambulatory Visit | Attending: Surgery | Admitting: Surgery

## 2016-07-12 ENCOUNTER — Other Ambulatory Visit: Payer: Self-pay | Admitting: *Deleted

## 2016-07-12 ENCOUNTER — Encounter (HOSPITAL_COMMUNITY): Payer: Self-pay | Admitting: Surgery

## 2016-07-12 ENCOUNTER — Encounter (HOSPITAL_COMMUNITY)
Admission: RE | Disposition: A | Discharge status: Home / Self Care | Payer: Self-pay | Source: Ambulatory Visit | Attending: Surgery

## 2016-07-12 DIAGNOSIS — I739 Peripheral vascular disease, unspecified: Secondary | ICD-10-CM

## 2016-07-12 DIAGNOSIS — F17210 Nicotine dependence, cigarettes, uncomplicated: Secondary | ICD-10-CM | POA: Diagnosis not present

## 2016-07-12 DIAGNOSIS — E7800 Pure hypercholesterolemia, unspecified: Secondary | ICD-10-CM | POA: Diagnosis not present

## 2016-07-12 DIAGNOSIS — I70213 Atherosclerosis of native arteries of extremities with intermittent claudication, bilateral legs: Secondary | ICD-10-CM | POA: Insufficient documentation

## 2016-07-12 DIAGNOSIS — Z01810 Encounter for preprocedural cardiovascular examination: Secondary | ICD-10-CM

## 2016-07-12 HISTORY — PX: ABDOMINAL AORTOGRAM W/LOWER EXTREMITY: CATH118223

## 2016-07-12 LAB — POCT I-STAT, CHEM 8
BUN: 15 mg/dL (ref 6–20)
CHLORIDE: 106 mmol/L (ref 101–111)
Calcium, Ion: 1.18 mmol/L (ref 1.15–1.40)
Creatinine, Ser: 0.9 mg/dL (ref 0.61–1.24)
GLUCOSE: 93 mg/dL (ref 65–99)
HCT: 38 % — ABNORMAL LOW (ref 39.0–52.0)
Hemoglobin: 12.9 g/dL — ABNORMAL LOW (ref 13.0–17.0)
POTASSIUM: 3.7 mmol/L (ref 3.5–5.1)
SODIUM: 141 mmol/L (ref 135–145)
TCO2: 26 mmol/L (ref 0–100)

## 2016-07-12 SURGERY — ABDOMINAL AORTOGRAM W/LOWER EXTREMITY
Anesthesia: LOCAL

## 2016-07-12 MED ORDER — SODIUM CHLORIDE 0.9 % IV SOLN: 500.0000 mL | Freq: Once | INTRAVENOUS | Status: DC | PRN

## 2016-07-12 MED ORDER — SODIUM CHLORIDE 0.9 % IV SOLN
INTRAVENOUS | Status: DC
  Administered 2016-07-12: 07:00:00 via INTRAVENOUS

## 2016-07-12 MED ORDER — OXYCODONE HCL 5 MG PO TABS: 5.0000 mg | ORAL_TABLET | ORAL | Status: DC | PRN

## 2016-07-12 MED ORDER — DOCUSATE SODIUM 100 MG PO CAPS: 100.0000 mg | ORAL_CAPSULE | Freq: Every day | ORAL | Status: DC

## 2016-07-12 MED ORDER — HEPARIN (PORCINE) IN NACL 2-0.9 UNIT/ML-% IJ SOLN
INTRAMUSCULAR | Status: AC
  Filled 2016-07-12: qty 1000

## 2016-07-12 MED ORDER — SODIUM CHLORIDE 0.9 % IV SOLN: 1.0000 mL/kg/h | INTRAVENOUS | Status: DC

## 2016-07-12 MED ORDER — PHENOL 1.4 % MT LIQD: 1.0000 | OROMUCOSAL | Status: DC | PRN

## 2016-07-12 MED ORDER — LABETALOL HCL 5 MG/ML IV SOLN: 10.0000 mg | INTRAVENOUS | Status: DC | PRN

## 2016-07-12 MED ORDER — ACETAMINOPHEN 325 MG RE SUPP: 325.0000 mg | RECTAL | Status: DC | PRN

## 2016-07-12 MED ORDER — MORPHINE SULFATE (PF) 10 MG/ML IV SOLN: 2.0000 mg | INTRAVENOUS | Status: DC | PRN

## 2016-07-12 MED ORDER — ONDANSETRON HCL 4 MG/2ML IJ SOLN: 4.0000 mg | Freq: Four times a day (QID) | INTRAMUSCULAR | Status: DC | PRN

## 2016-07-12 MED ORDER — LIDOCAINE HCL 1 % IJ SOLN
INTRAMUSCULAR | Status: AC
  Filled 2016-07-12: qty 20

## 2016-07-12 MED ORDER — FENTANYL CITRATE (PF) 100 MCG/2ML IJ SOLN
INTRAMUSCULAR | Status: AC
  Filled 2016-07-12: qty 2

## 2016-07-12 MED ORDER — MIDAZOLAM HCL 2 MG/2ML IJ SOLN
INTRAMUSCULAR | Status: DC | PRN
  Administered 2016-07-12: 1 mg via INTRAVENOUS

## 2016-07-12 MED ORDER — HYDRALAZINE HCL 20 MG/ML IJ SOLN: 5.0000 mg | INTRAMUSCULAR | Status: DC | PRN

## 2016-07-12 MED ORDER — METOPROLOL TARTRATE 5 MG/5ML IV SOLN: 2.0000 mg | INTRAVENOUS | Status: DC | PRN

## 2016-07-12 MED ORDER — IODIXANOL 320 MG/ML IV SOLN
INTRAVENOUS | Status: DC | PRN
  Administered 2016-07-12: 117 mL via INTRA_ARTERIAL

## 2016-07-12 MED ORDER — GUAIFENESIN-DM 100-10 MG/5ML PO SYRP: 15.0000 mL | ORAL_SOLUTION | ORAL | Status: DC | PRN

## 2016-07-12 MED ORDER — LIDOCAINE HCL (PF) 1 % IJ SOLN
INTRAMUSCULAR | Status: DC | PRN
  Administered 2016-07-12: 18 mL

## 2016-07-12 MED ORDER — ACETAMINOPHEN 325 MG PO TABS: 325.0000 mg | ORAL_TABLET | ORAL | Status: DC | PRN

## 2016-07-12 MED ORDER — ALUM & MAG HYDROXIDE-SIMETH 200-200-20 MG/5ML PO SUSP: 15.0000 mL | ORAL | Status: DC | PRN

## 2016-07-12 MED ORDER — HEPARIN (PORCINE) IN NACL 2-0.9 UNIT/ML-% IJ SOLN
INTRAMUSCULAR | Status: AC | PRN
  Administered 2016-07-12: 1000 mL

## 2016-07-12 MED ORDER — FENTANYL CITRATE (PF) 100 MCG/2ML IJ SOLN
INTRAMUSCULAR | Status: DC | PRN
  Administered 2016-07-12: 25 ug via INTRAVENOUS

## 2016-07-12 MED ORDER — MIDAZOLAM HCL 2 MG/2ML IJ SOLN
INTRAMUSCULAR | Status: AC
  Filled 2016-07-12: qty 2

## 2016-07-12 SURGICAL SUPPLY — 10 items
CATH OMNI FLUSH 5F 65CM (CATHETERS) ×2 IMPLANT
COVER PRB 48X5XTLSCP FOLD TPE (BAG) ×1 IMPLANT
COVER PROBE 5X48 (BAG) ×1
KIT MICROINTRODUCER STIFF 5F (SHEATH) ×2 IMPLANT
KIT PV (KITS) ×2 IMPLANT
SHEATH PINNACLE 5F 10CM (SHEATH) ×2 IMPLANT
SYR MEDRAD MARK V 150ML (SYRINGE) ×2 IMPLANT
TRANSDUCER W/STOPCOCK (MISCELLANEOUS) ×2 IMPLANT
TRAY PV CATH (CUSTOM PROCEDURE TRAY) ×2 IMPLANT
WIRE BENTSON .035X145CM (WIRE) ×2 IMPLANT

## 2016-07-12 NOTE — Op Note (Signed)
    Patient name: Benjamin [name] MRN: [id number] DOB: 06/09/1960 Sex: male  07/12/2016 Pre-operative Diagnosis: Bilateral claudication, left greater than right Post-operative diagnosis:  Same Surgeon:  Durene CalBrabham, [place] Procedure Performed:  1.  Ultrasound-guided access, right femoral artery  2.  Abdominal aortogram  3.  Bilateral lower extremity runoff  4.  Conscious sedation (17minutes)    Indications:  The patient recently developed left leg claudication and had a ultrasound that showed severe vascular disease.  He comes in today for further evaluation.  Procedure:  The patient was identified in the holding area and taken to room 8.  The patient was then placed supine on the table and prepped and draped in the usual sterile fashion.  A time out was called.  Conscious sedation was administered with IV fentanyl and Versed and a continuous physician and nurse monitoring.  Heart rate, blood pressure, and oxygen saturations were continuously monitored.  Ultrasound was used to evaluate the right common femoral artery.  It was patent .  A digital ultrasound image was acquired.  A micropuncture needle was used to access the right common femoral artery under ultrasound guidance.  An 018 wire was advanced without resistance and a micropuncture sheath was placed.  The 018 wire was removed and a benson wire was placed.  The micropuncture sheath was exchanged for a 5 french sheath.  An omniflush catheter was advanced over the wire to the level of L-1.  An abdominal angiogram was obtained.  The catheter was pulled down to the aortic bifurcation and bilateral lower extremity runoff was performed  Findings:   Aortogram:  The renal arteries are patent without significant stenosis.  The infrarenal abdominal aorta is patent without significant stenosis.  The right common and external iliac artery are patent throughout their course.  The left common iliac is patent.  The left external iliac artery is patent down to just  above the femoral head where it occludes.  Right Lower Extremity:  The right common femoral artery is patent.  The profunda femoral artery is widely patent.  The right superficial femoral artery is occluded at its origin.  There is reconstitution of the above-knee popliteal artery.  Tibial vessel evaluation is somewhat limited due to patient movement.  The posterior tibial artery does appear to be opened.  Left Lower Extremity:  The left common femoral artery is occluded.  There is reconstitution of the fundus femoral artery at its origin.  The superficial femoral artery is occluded.  There is reconstitution of the above-knee popliteal artery.  Limited evaluation of the tibial vessels.  Intervention:  None  Impression:  #1  the patient will be brought back for discussions regarding surgical revascularization.  #2  consideration for femoral endarterectomy on the left with or without femoral-popliteal bypass    V. Durene CalWells Kassady Laboy, M.D. [facility] [place] Office: [phone] Pager:  [phone]

## 2016-07-12 NOTE — H&P (View-Only) (Signed)
[facility]  Patient name: Benjamin [name] MRN: [id number] DOB: 1960-10-20 Sex: male   REFERRING PROVIDER:    Dr. Susann Givens   REASON FOR CONSULT:    claudication  HISTORY OF PRESENT ILLNESS:   Benjamin [name] is a 56 y.o. male, who is Referred today for leg pain.  He underwent a ultrasound last week that showed severe lower history vascular disease.  The patient states he has been having trouble with his leg pain for approximately one month.  He states that he can walk approximately one block before he gets pain and cramping in his legs.  He also states that he wakes up at night with pain in his left leg.  This is improved by hanging the leg over the bed and by getting up and walking.  He has to take a sleeping pill at night to help him fall asleep because of the pain.  He does not have any open wounds.  The patient has a long-term history of smoking.  He takes a statin for hypercholesterolemia.  PAST MEDICAL HISTORY    Past Medical History:  Diagnosis Date  . Back pain   . Heavy smoker      FAMILY HISTORY   Family History  Problem Relation Age of Onset  . Arthritis Mother   . COPD Father   . Stroke Father   . Multiple sclerosis Sister     SOCIAL HISTORY:   Social History   Social History  . Marital status: Single    Spouse name: N/A  . Number of children: N/A  . Years of education: N/A   Occupational History  . Not on file.   Social History Main Topics  . Smoking status: Current Every Day Smoker    Packs/day: 1.50  . Smokeless tobacco: Never Used  . Alcohol use No  . Drug use: No  . Sexual activity: Not on file   Other Topics Concern  . Not on file   Social History Narrative  . No narrative on file    ALLERGIES:    Allergies  Allergen Reactions  . Aspirin Nausea And Vomiting    CURRENT MEDICATIONS:    Current Outpatient Prescriptions  Medication Sig Dispense Refill  . buPROPion  (WELLBUTRIN SR) 150 MG 12 hr tablet Take 1 tablet (150 mg total) by mouth daily. 30 tablet 1  . nicotine (NICODERM CQ - DOSED IN MG/24 HOURS) 21 mg/24hr patch Place 1 patch (21 mg total) onto the skin daily. 28 patch 0  . pravastatin (PRAVACHOL) 20 MG tablet Take 1 tablet (20 mg total) by mouth daily. 90 tablet 1   No current facility-administered medications for this visit.     REVIEW OF SYSTEMS:   [X]  denotes positive finding, [ ]  denotes negative finding Cardiac  Comments:  Chest pain or chest pressure:    Shortness of breath upon exertion:    Short of breath when lying flat:    Irregular heart rhythm:        Vascular    Pain in calf, thigh, or hip brought on by ambulation: x   Pain in feet at night that wakes you up from your sleep:  x   Blood clot in your veins: x   Leg swelling:         Pulmonary    Oxygen at home:    Productive cough:     Wheezing:         Neurologic  Sudden weakness in arms or legs:     Sudden numbness in arms or legs:     Sudden onset of difficulty speaking or slurred speech:    Temporary loss of vision in one eye:     Problems with dizziness:         Gastrointestinal    Blood in stool:      Vomited blood:         Genitourinary    Burning when urinating:     Blood in urine:        Psychiatric    Major depression:         Hematologic    Bleeding problems:    Problems with blood clotting too easily:        Skin    Rashes or ulcers:        Constitutional    Fever or chills:     PHYSICAL EXAM:   Vitals:   07/04/16 1527  BP: (!) 140/92  Pulse: 64  Resp: 16  Temp: 98.4 F (36.9 C)  TempSrc: Oral  SpO2: 99%  Weight: 156 lb (70.8 kg)  Height: 5\' 9"  (1.753 m)    GENERAL: The patient is a well-nourished male, in no acute distress. The vital signs are documented above. CARDIAC: There is a regular rate and rhythm.  VASCULAR: Nonpalpable pedal pulses PULMONARY: Nonlabored respirations MUSCULOSKELETAL: There are no major  deformities or cyanosis. NEUROLOGIC: No focal weakness or paresthesias are detected. SKIN: There are no ulcers or rashes noted. PSYCHIATRIC: The patient has a normal affect.  STUDIES:   I have reviewed his Doppler studies with the following findings: 1. 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 Collateral flow noted and monophasic    waveforms seen in PTA, Pero and ATA at ankle. The left common    and superficial femoral, popliteal arteries appear chronically    occluded. Acute thrombosis vs soft plaque seen in the left    external iliac artery. Unable to obtain arterial signal in the    left distal calf arteries and foot.  ASSESSMENT and PLAN   The patient has borderline rest pain in his left leg.  I have discussed proceeding with angiography to better define his anatomy and see what his treatment options are.  I will plan on cannulation of the right groin and intervention on the left leg if feasible.  This is been scheduled for Tuesday, May 22.  I stressed the importance of smoking cessation.  We discussed the risks and benefits of the procedure.  I answered all of his questions today.   Durene CalWells Brabham, MD [facility] Tel [phone] Pager [phone]

## 2016-07-12 NOTE — Discharge Instructions (Signed)
Angiogram, Care After °This sheet gives you information about how to care for yourself after your procedure. Your health care provider may also give you more specific instructions. If you have problems or questions, contact your health care provider. °What can I expect after the procedure? °After the procedure, it is common to have bruising and tenderness at the catheter insertion area. °Follow these instructions at home: °Insertion site care  °· Follow instructions from your health care provider about how to take care of your insertion site. Make sure you: °¨ Wash your hands with soap and water before you change your bandage (dressing). If soap and water are not available, use hand sanitizer. °¨ Change your dressing as told by your health care provider. °¨ Leave stitches (sutures), skin glue, or adhesive strips in place. These skin closures may need to stay in place for 2 weeks or longer. If adhesive strip edges start to loosen and curl up, you may trim the loose edges. Do not remove adhesive strips completely unless your health care provider tells you to do that. °· Do not take baths, swim, or use a hot tub until your health care provider approves. °· You may shower 24-48 hours after the procedure or as told by your health care provider. °¨ Gently wash the site with plain soap and water. °¨ Pat the area dry with a clean towel. °¨ Do not rub the site. This may cause bleeding. °· Do not apply powder or lotion to the site. Keep the site clean and dry. °· Check your insertion site every day for signs of infection. Check for: °¨ Redness, swelling, or pain. °¨ Fluid or blood. °¨ Warmth. °¨ Pus or a bad smell. °Activity  °· Rest as told by your health care provider, usually for 1-2 days. °· Do not lift anything that is heavier than 10 lbs. (4.5 kg) or as told by your health care provider. °· Do not drive for 24 hours if you were given a medicine to help you relax (sedative). °· Do not drive or use heavy machinery while  taking prescription pain medicine. °General instructions  °· Return to your normal activities as told by your health care provider, usually in about a week. Ask your health care provider what activities are safe for you. °· If the catheter site starts bleeding, lie flat and put pressure on the site. If the bleeding does not stop, get help right away. This is a medical emergency. °· Drink enough fluid to keep your urine clear or pale yellow. This helps flush the contrast dye from your body. °· Take over-the-counter and prescription medicines only as told by your health care provider. °· Keep all follow-up visits as told by your health care provider. This is important. °Contact a health care provider if: °· You have a fever or chills. °· You have redness, swelling, or pain around your insertion site. °· You have fluid or blood coming from your insertion site. °· The insertion site feels warm to the touch. °· You have pus or a bad smell coming from your insertion site. °· You have bruising around the insertion site. °· You notice blood collecting in the tissue around the catheter site (hematoma). The hematoma may be painful to the touch. °Get help right away if: °· You have severe pain at the catheter insertion area. °· The catheter insertion area swells very fast. °· The catheter insertion area is bleeding, and the bleeding does not stop when you hold steady pressure on   the area. °· The area near or just beyond the catheter insertion site becomes pale, cool, tingly, or numb. °These symptoms may represent a serious problem that is an emergency. Do not wait to see if the symptoms will go away. Get medical help right away. Call your local emergency services (911 in the [place] Do not drive yourself to the hospital. °Summary °· After the procedure, it is common to have bruising and tenderness at the catheter insertion area. °· After the procedure, it is important to rest and drink plenty of fluids. °· Do not take baths,  swim, or use a hot tub until your health care provider says it is okay to do so. You may shower 24-48 hours after the procedure or as told by your health care provider. °· If the catheter site starts bleeding, lie flat and put pressure on the site. If the bleeding does not stop, get help right away. This is a medical emergency. °This information is not intended to replace advice given to you by your health care provider. Make sure you discuss any questions you have with your health care provider. °Document Released: 08/26/2004 Document Revised: 01/13/2016 Document Reviewed: 01/13/2016 °Elsevier Interactive Patient Education © 2017 [employer] Inc[employer] ° °

## 2016-07-12 NOTE — Progress Notes (Addendum)
Site area: RFA Site Prior to Removal:  Level 0 Pressure Applied For:27 min Manual:  yes  Patient Status During Pull:  stable Post Pull Site:  Level 0 Post Pull Instructions Given: yes  Post Pull Pulses Present: palpable rt PT Dressing Applied:  tegaderm Bedrest begins @ 0850 till 1250 Comments:

## 2016-07-12 NOTE — Interval H&P Note (Signed)
History and Physical Interval Note:  07/12/2016 7:18 AM  Benjamin [name]  has presented today for surgery, with the diagnosis of pvd/claudication  The various methods of treatment have been discussed with the patient and family. After consideration of risks, benefits and other options for treatment, the patient has consented to  Procedure(s): Abdominal Aortogram w/Lower Extremity (N/A) as a surgical intervention .  The patient's history has been reviewed, patient examined, no change in status, stable for surgery.  I have reviewed the patient's chart and labs.  Questions were answered to the patient's satisfaction.     Durene CalBrabham, [place]

## 2016-07-13 ENCOUNTER — Telehealth: Payer: Self-pay | Admitting: Medical

## 2016-07-13 ENCOUNTER — Encounter (HOSPITAL_COMMUNITY): Payer: PRIVATE HEALTH INSURANCE

## 2016-07-13 NOTE — Telephone Encounter (Signed)
The Wellbutrin tablet is to also reduce cravings for tobacco.  This is an oral medication Lets give this some time to work.  He could also use OTC nicotine lozenge or sucker or gum if the nicotine patch is too expensive.   Lets try this for now

## 2016-07-13 NOTE — Telephone Encounter (Signed)
Called l/m on his sister voicemail, because his voicemail is full

## 2016-07-14 ENCOUNTER — Other Ambulatory Visit: Payer: Self-pay | Admitting: Medical

## 2016-07-14 ENCOUNTER — Other Ambulatory Visit: Payer: Self-pay

## 2016-07-14 MED ORDER — BUPROPION HCL ER (SR) 150 MG PO TB12: 150.0000 mg | ORAL_TABLET | Freq: Every day | ORAL | 1 refills | Status: DC

## 2016-07-14 MED ORDER — PRAVASTATIN SODIUM 20 MG PO TABS: 20.0000 mg | ORAL_TABLET | Freq: Every day | ORAL | 1 refills | Status: DC

## 2016-07-14 MED ORDER — LOVASTATIN 20 MG PO TABS: 20.0000 mg | ORAL_TABLET | Freq: Every day | ORAL | 1 refills | Status: DC

## 2016-07-14 MED ORDER — BUPROPION HCL 100 MG PO TABS: 100.0000 mg | ORAL_TABLET | Freq: Two times a day (BID) | ORAL | 2 refills | Status: DC

## 2016-07-14 NOTE — Telephone Encounter (Signed)
Called spoke his sister about this, pt is also bring forms to be fill out  Work.

## 2016-07-14 NOTE — Telephone Encounter (Signed)
I sent lovastatin since its cheap on [facility] list.  And I sent Wellbutrin BID/bupropion that may also be cheaper. Let me know

## 2016-07-14 NOTE — Telephone Encounter (Signed)
Called [facility] the pravastatin is 16.01 for 30 day supply and the Wellbutrin sr  Is 25.83 , the lovastatin  Is the only one on the four dollar list. Did you want to change to lovastatin to help the pt with cost. He is on a fixed income .

## 2016-07-15 ENCOUNTER — Telehealth: Payer: Self-pay | Admitting: Medical

## 2016-07-15 NOTE — Telephone Encounter (Signed)
Pt came in and dropped off FMLA forms. Sending back to be completed. Please call pt at [phone] or [phone] when ready.

## 2016-07-19 ENCOUNTER — Telehealth: Payer: Self-pay | Admitting: Medical

## 2016-07-19 ENCOUNTER — Encounter: Payer: Self-pay | Admitting: Surgery

## 2016-07-19 ENCOUNTER — Telehealth: Payer: Self-pay | Admitting: Surgery

## 2016-07-19 NOTE — Telephone Encounter (Signed)
Sched cardiac clearance 07/25/16 at 8:40 with Dr. Herbie BaltimoreHarding. Sched vein mapping 07/22/16 at 1:00 and MD 08/03/16 at 11:30. Spoke to pt to confirm appts.

## 2016-07-19 NOTE — Telephone Encounter (Signed)
Pt p/u  Forms

## 2016-07-19 NOTE — Telephone Encounter (Signed)
Regarding the FMLA forms I received, what dates has he missed?  What dates is he going to be out due to surgery?  If this FMLA is more directed towards time off for vascular surgery, then the vascular surgery office will need to decide the amount of time he may need to be out.  Let me know.

## 2016-07-19 NOTE — Telephone Encounter (Signed)
-----   Message from Sharee PimpleMarilyn K McChesney, RN sent at 07/12/2016 10:25 AM EDT ----- Regarding: cardio clearance and vein mapping   ----- Message ----- From: Nada LibmanBrabham, Vance W, MD Sent: 07/12/2016   8:24 AM To: [facility] Charge Pool  07/12/2016:  Surgeon:  Durene CalBrabham, [place] Procedure Performed:  1.  Ultrasound-guided access, right femoral artery  2.  Abdominal aortogram  3.  Bilateral lower extremity runoff  4.  Conscious sedation (17minutes)  Schedule patient to see me back in the office within the next 2 weeks with vein mapping.  He also needs to be referred to cardiology for surgical clearance for femoral popliteal bypass graft.

## 2016-07-19 NOTE — Telephone Encounter (Signed)
Patient called re: FMLA forms that he dropped off previously , he states he needs another MD to fill Them out, I will leave up front for pt to pick up Forms have not been completed by this office

## 2016-07-22 ENCOUNTER — Ambulatory Visit (HOSPITAL_COMMUNITY)
Admission: RE | Admit: 2016-07-22 | Discharge: 2016-07-22 | Disposition: A | Discharge status: Home / Self Care | Payer: PRIVATE HEALTH INSURANCE | Source: Ambulatory Visit | Attending: Surgery | Admitting: Surgery

## 2016-07-22 DIAGNOSIS — Z01810 Encounter for preprocedural cardiovascular examination: Secondary | ICD-10-CM | POA: Insufficient documentation

## 2016-07-22 DIAGNOSIS — I739 Peripheral vascular disease, unspecified: Secondary | ICD-10-CM | POA: Diagnosis not present

## 2016-07-25 ENCOUNTER — Ambulatory Visit (INDEPENDENT_AMBULATORY_CARE_PROVIDER_SITE_OTHER): Payer: PRIVATE HEALTH INSURANCE | Admitting: Cardiology

## 2016-07-25 ENCOUNTER — Encounter: Payer: Self-pay | Admitting: Cardiology

## 2016-07-25 VITALS — BP 142/84 | HR 63 | Ht 68.0 in | Wt 156.2 lb

## 2016-07-25 DIAGNOSIS — I739 Peripheral vascular disease, unspecified: Secondary | ICD-10-CM | POA: Diagnosis not present

## 2016-07-25 DIAGNOSIS — E785 Hyperlipidemia, unspecified: Secondary | ICD-10-CM | POA: Diagnosis not present

## 2016-07-25 DIAGNOSIS — F17200 Nicotine dependence, unspecified, uncomplicated: Secondary | ICD-10-CM | POA: Diagnosis not present

## 2016-07-25 DIAGNOSIS — R9431 Abnormal electrocardiogram [ECG] [EKG]: Secondary | ICD-10-CM | POA: Diagnosis not present

## 2016-07-25 DIAGNOSIS — Z01810 Encounter for preprocedural cardiovascular examination: Secondary | ICD-10-CM | POA: Diagnosis not present

## 2016-07-25 DIAGNOSIS — I10 Essential (primary) hypertension: Secondary | ICD-10-CM | POA: Diagnosis not present

## 2016-07-25 DIAGNOSIS — R0602 Shortness of breath: Secondary | ICD-10-CM

## 2016-07-25 NOTE — Patient Instructions (Addendum)
SCHEDULE [address] Your physician has requested that you have an echocardiogram. Echocardiography is a painless test that uses sound waves to create images of your heart. It provides your doctor with information about the size and shape of your heart and how well your heart's chambers and valves are working. This procedure takes approximately one hour. There are no restrictions for this procedure. IF RESULT IS NORMAL, OKAY TO HAVE CARDIAC CLEARANCE FOR SURGERY WITH Dr Myra GianottiBrabham If abnormal follow up prior to surgery.   No medication changes    Your physician recommends that you schedule a follow-up appointment in 6 weeks with Dr Herbie BaltimoreHarding  After surgery with Dr Myra GianottiBrabham.

## 2016-07-25 NOTE — Assessment & Plan Note (Signed)
High blood pressure today on exam. He may very well have hypertension. We will need to assess further to determine if he would require and hypertensive agents. For now will just simply monitor.

## 2016-07-25 NOTE — Assessment & Plan Note (Signed)
He has shortness of breath with significant exertion. With peripheral vascular disease being a risk: For coronary disease, we need to exclude cardiomyopathy. Also given his relatively ~abnormal EKG, and echocardiogram will help determine if there is true wall motion abnormalities.  Pending echocardiogram, would only consider ischemic evaluation if abnormal. In the future we will need to assess the extent of dyspnea once his claudication is been treated.

## 2016-07-25 NOTE — Assessment & Plan Note (Signed)
Currently on lovastatin for on certain duration. LDL currently 107. This will need to follow closely given his PVD.

## 2016-07-25 NOTE — Assessment & Plan Note (Signed)
Significant bilateral femoral artery disease with planned left femoral endarterectomy and possible femoropopliteal bypass. Despite having right leg disease as well, he is not noticing claudication on the right leg.  He is on lovastatin now, but lipids are not quite at goal. Hypertensive on exam today, not on any medications. Needs to quit smoking.

## 2016-07-25 NOTE — Progress Notes (Addendum)
PCP: Jac Canavan, PA-C  Clinic Note: No chief complaint on file.   HPI: Benjamin [name] is a 56 y.o. male who is being seen today for preoperative cardiovascular evaluation of  at the request of Tysinger, Keithan Dileonardo, PA-C & Dr. Myra Gianotti from [facility].[name] was referred at the request of Dr. Myra Gianotti from vascular surgery for preoperative evaluation for possible left femoral endarterectomy and possible femoropopliteal bypass. He has a history of hyperlipidemia and is a heavy smoker. No family history of CAD.  Recent Hospitalizations: femoral angiogram 07/12/2016  Studies Personally Reviewed - (if available, images/films reviewed: From Epic Chart or Care Everywhere)  Abdominal aortogram with lower extremity runoff 07/12/2016: Patent renal arteries. No significant disease in the infrarenal abdominal aorta. Relatively normal iliac arteries. Right SFA is occluded at its origin with reconstruction above the knee in the popliteal artery. PT appears to be open (cannot assess others). Left common femoral artery occluded. Reconstitution of the profundus femoral arteries noted at the origin. Superficial femoral artery is occluded - reconstitutes above the knee popliteal artery.  Interval History: Benjamin [name] presents today really not sure why he is here. He has not had any cardiac symptoms because besides some exertional dyspnea because of his smoking. He walks almost every day to work at least 15 minutes back-and-forth without any major cardiac symptoms besides worsening left leg claudication over the last few months. 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 He denies any significant chest tightness or pressure with rest or exertion. He really only notes exertion when he is trying to walk fast or does strenuous work. He denies any right-sided claudication symptoms. He also denies any heart failure symptoms of PND, orthopnea or edema.   No palpitations, lightheadedness, dizziness,  weakness or syncope/near syncope. No TIA/amaurosis fugax symptoms.  ROS: A comprehensive was performed. Review of Systems  Constitutional: Positive for weight loss (He says he lost weight because he was taking some energy pills. When he was doing that he felt his heart rate go up. Now he is stop the pills, he is not having palpitations anymore, but has not gained weight back.). Negative for malaise/fatigue.  HENT: Negative for nosebleeds.   Respiratory: Positive for cough (Daily) and shortness of breath (With exertion). Negative for wheezing.   Cardiovascular: Positive for claudication (Per history of present illness).  Gastrointestinal: Negative for blood in stool and melena.  Genitourinary: Negative for hematuria.  Musculoskeletal: Negative for joint pain and myalgias.  Skin: Negative.   Neurological: Negative for dizziness, focal weakness and loss of consciousness.  Endo/Heme/Allergies: Negative for environmental allergies.  Psychiatric/Behavioral: Negative for depression and memory loss. The patient is not nervous/anxious and does not have insomnia.   All other systems reviewed and are negative.  I have reviewed and (if needed) personally updated the patient's problem list, medications, allergies, past medical and surgical history, social and family history.   Past Medical History:  Diagnosis Date  . Back pain   . Heavy smoker   . Hyperlipidemia LDL goal <70   . 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   Bilateral femoral artery disease    Past Surgical History:  Procedure Laterality Date  . ABDOMINAL AORTOGRAM W/LOWER EXTREMITY N/A 07/12/2016   Procedure: Abdominal Aortogram w/Lower Extremity;  Surgeon: Nada Libman, MD;  Location: [facility];  Service: Cardiovascular;  Laterality: N/A;  Bilateral extermity: Patent Renal As. No sig Dz in infrarenal Abd Aorta. Normal Bilat Iliac arteries.  R SFA is 100% @ origin - recon in AK-Pop A. R PT A patent. L CFA  occluded. L PFA recon @ origin, L SFA occluded w/ recon in AK Pop A.  . COLONOSCOPY  06/2016   never  . NO PAST SURGERIES  06/2016    Current Meds  Medication Sig  . acetaminophen (TYLENOL) 500 MG tablet Take 500 mg by mouth every 6 (six) hours as needed for mild pain or headache.  Marland Kitchen buPROPion (WELLBUTRIN) 100 MG tablet Take 1 tablet (100 mg total) by mouth 2 (two) times daily.  . diphenhydramine-acetaminophen (TYLENOL PM) 25-500 MG TABS tablet Take 1 tablet by mouth at bedtime as needed (sleep).  . lovastatin (MEVACOR) 20 MG tablet Take 1 tablet (20 mg total) by mouth daily.    Allergies  Allergen Reactions  . Aspirin Nausea And Vomiting  . Garlic Other (See Comments)    sneezing    Social History   Social History  . Marital status: Single    Spouse name: N/A  . Number of children: N/A  . Years of education: 87   Social History Main Topics  . Smoking status: Current Every Day Smoker    Packs/day: 1.50  . Smokeless tobacco: Never Used     Comment: At least 20 years  . Alcohol use No  . Drug use: No  . Sexual activity: Not Currently   Other Topics Concern  . 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   He lives with his sister and her son.   Walks every day to work at least 15+ minutes.    family history includes Arthritis in his mother; COPD in his father; Multiple sclerosis in his sister; Stroke in his father.  Wt Readings from Last 3 Encounters:  07/25/16 156 lb 3.2 oz (70.9 kg)  07/12/16 156 lb (70.8 kg)  07/04/16 156 lb (70.8 kg)    PHYSICAL EXAM BP (!) 142/84   Pulse 63   Ht 5\' 8"  (1.727 m)   Wt 156 lb 3.2 oz (70.9 kg)   BMI 23.75 kg/m  General appearance: alert, cooperative, appears stated age, no distress.  Well-nourished, well-groomed. He smells of cigarettes profoundly. HEENT: [place]/AT, EOMI, MMM, anicteric sclera; very poor dentition Neck: no adenopathy, no carotid bruit and no JVD Lungs: clear to  auscultation bilaterally, normal percussion bilaterally and non-labored  - occasional expiratory wheeze. Heart: regular rate and rhythm, S1 &S2 normal, no murmur, click, rub or gallop; nondisplaced PMI Abdomen: soft, non-tender; bowel sounds normal; no masses,  no organomegaly; no HJR Extremities: extremities normal, atraumatic, no cyanosis, or edema . Mild clubbing of fingers Pulses: 2+ and symmetric radial pulses with diminished pedal pulses left worse than right. Skin: mobility and turgor normal, no evidence of bleeding or bruising, no lesions noted, temperature normal, texture normal and No rashes or lesions or ulcers.  Neurologic: Mental status: Alert & oriented x 3, thought content appropriate; non-focal exam.  Pleasant mood & affect. Cranial nerves: normal (II-XII grossly intact)    Adult ECG Report  Rate: 63 ;  Rhythm: normal sinus rhythm and indeterminate; normal axis, intervals and durations. Cannot exclude septal infarct, age undetermined. Also T-wave inversions cannot exclude inferior ischemia.  Narrative Interpretation: Borderline EKG, likely normal variant. Stable.   Other studies Reviewed: Additional studies/ records that were reviewed today include:  Recent Labs:   Lab Results  Component Value Date   CHOL 192 06/24/2016   HDL  59 06/24/2016   LDLCALC 107 (H) 06/24/2016   TRIG 128 06/24/2016   CHOLHDL 3.3 06/24/2016   Lab Results  Component Value Date   CREATININE 0.90 07/12/2016   BUN 15 07/12/2016   NA 141 07/12/2016   K 3.7 07/12/2016   CL 106 07/12/2016   CO2 25 06/24/2016   ASSESSMENT / PLAN: Problem List Items Addressed This Visit    Abnormal EKG    Not convinced that this is truly indicative of ischemia or prior infarction. He is not having any active anginal symptoms. Has not had any recollection of anginal symptoms. Plan: Check 2-D echocardiogram to assess LV systolic and diastolic function.      Relevant Orders   EKG 12-Lead   ECHOCARDIOGRAM  COMPLETE   Dyslipidemia, goal LDL below 70 (Chronic)    Currently on lovastatin for on certain duration. LDL currently 107. This will need to follow closely given his PVD.      Heavy smoker    Clearly this puts him at cardiac risk. Continue to be echocardiogram dyspnea, however I think is probably more related to smoking then CHF with no other symptoms of CHF.  Smoking cessation instruction/counseling given:  counseled patient on the dangers of tobacco use, advised patient to stop smoking, and reviewed strategies to maximize success      Relevant Orders   EKG 12-Lead   High blood pressure    High blood pressure today on exam. He may very well have hypertension. We will need to assess further to determine if he would require and hypertensive agents. For now will just simply monitor.      Peripheral vascular disease (HCC) (Chronic)    Significant bilateral femoral artery disease with planned left femoral endarterectomy and possible femoropopliteal bypass. Despite having right leg disease as well, he is not noticing claudication on the right leg.  He is on lovastatin now, but lipids are not quite at goal. Hypertensive on exam today, not on any medications. Needs to quit smoking.       Preoperative cardiovascular examination - Primary    PREOPERATIVE CARDIAC RISK ASSESSMENT   Revised Cardiac Risk Index:  High Risk Surgery: no; inguinal vascular  Defined as Intraperitoneal, intrathoracic or suprainguinal vascular  Active CAD: no; no active angina  CHF: Uncertain, but unlikely. No PND, orthopnea or edema. Assessing 2-D echocardiogram to evaluate EF  Cerebrovascular Disease: no; no history of stroke or TIA  Diabetes: no; On Insulin: no  CKD (Cr >~ 2): no; creatinine 0.9  Total: At current 0 Estimated Risk of Adverse Outcome: LOW  Estimated Risk of MI, PE, VF/VT (Cardiac Arrest), Complete Heart Block: <1 %   ACC/AHA Guidelines for "Clearance":  Step 1 - Need for Emergency  Surgery: No: Not acute  If Yes - go straight to OR with perioperative surveillance  Step 2 - Active Cardiac Conditions (Unstable Angina, Decompensated HF, Significant  Arrhytmias - Complete HB, Mobitz II, Symptomatic VT or SVT, Severe Aortic Stenosis - mean gradient > 40 mmHg, Valve area < 1.0 cm2):   No: No active angina  If Yes - Evaluate & Treat per ACC/AHA Guidelines  Step 3 -  Low Risk Surgery: No: Intermediate  If Yes --> proceed to OR  If No --> Step 4  Step 4 - Functional Capacity >= 4 METS without symptoms: Yes  If Yes --> proceed to OR  If No --> Step 5  Step 5 --  Clinical Risk Factors (CRF)  --> based on step 4  with no active cardiac symptoms, I would not proceed with an ischemic evaluation as it would not change my management at this time.  3 or more: No: At most one pending echo  If Yes -- assess Surgical Risk, --   (High Risk Non-cardiac), Intraabdominal or thoracic vascular surgery consider testing if it will change management.  Intermediate Risk: Proceed to OR with HR control, or consider testing if it will change management  1-2 or more CRFs: No: May be one pending echo results  If Yes -- assess Surgical Risk, --   (High Risk Non-cardiac), Intraabdominal or thoracic vascular surgery --> Proceed to OR, or consider testing if it will change management.  Intermediate Risk: Proceed to OR with HR control, or consider testing if it will change management  No CRFs: Yes  If Yes --> Proceed to OR  We will check a 2-D echocardiogram simply to get a better assessment of his EF given his abnormal EKG, as this is normal, I see no reason to proceed with any Of Further Evaluation until His Vascular Surgery Is Complete.  I do think in the future, I would be reasonable to consider ischemic evaluation with potentially a CT angiogram. - This would not however change her management at present and therefore I would not want to do so until his peripheral ask her surgery is  complete.       Relevant Orders   EKG 12-Lead   ECHOCARDIOGRAM COMPLETE   Shortness of breath    He has shortness of breath with significant exertion. With peripheral vascular disease being a risk: For coronary disease, we need to exclude cardiomyopathy. Also given his relatively ~abnormal EKG, and echocardiogram will help determine if there is true wall motion abnormalities.  Pending echocardiogram, would only consider ischemic evaluation if abnormal. In the future we will need to assess the extent of dyspnea once his claudication is been treated.      Relevant Orders   ECHOCARDIOGRAM COMPLETE      Current medicines are reviewed at length with the patient today. (+/- concerns) None The following changes have been made: None  Patient Instructions  [address]LE [address] Your physician has requested that you have an echocardiogram. Echocardiography is a painless test that uses sound waves to create images of your heart. It provides your doctor with information about the size and shape of your heart and how well your heart's chambers and valves are working. This procedure takes approximately one hour. There are no restrictions for this procedure. IF RESULT IS NORMAL, OKAY TO HAVE CARDIAC CLEARANCE FOR SURGERY WITH Dr Myra GianottiBrabham If abnormal follow up prior to surgery.   No medication changes    Your physician recommends that you schedule a follow-up appointment in 6 weeks with Dr Herbie BaltimoreHarding  After surgery with Dr Myra GianottiBrabham.   Studies Ordered:   Orders Placed This Encounter  Procedures  . EKG 12-Lead  . ECHOCARDIOGRAM COMPLETE      Bryan Lemmaavid Harding, M.D., M.S. Interventional Cardiologist   Pager # [phone] Phone # [phone] [place], [place] [id number]

## 2016-07-25 NOTE — Assessment & Plan Note (Signed)
Clearly this puts him at cardiac risk. Continue to be echocardiogram dyspnea, however I think is probably more related to smoking then CHF with no other symptoms of CHF.  Smoking cessation instruction/counseling given:  counseled patient on the dangers of tobacco use, advised patient to stop smoking, and reviewed strategies to maximize success

## 2016-07-25 NOTE — Assessment & Plan Note (Signed)
PREOPERATIVE CARDIAC RISK ASSESSMENT   Revised Cardiac Risk Index:  High Risk Surgery: no; inguinal vascular  Defined as Intraperitoneal, intrathoracic or suprainguinal vascular  Active CAD: no; no active angina  CHF: Uncertain, but unlikely. No PND, orthopnea or edema. Assessing 2-D echocardiogram to evaluate EF  Cerebrovascular Disease: no; no history of stroke or TIA  Diabetes: no; On Insulin: no  CKD (Cr >~ 2): no; creatinine 0.9  Total: At current 0 Estimated Risk of Adverse Outcome: LOW  Estimated Risk of MI, PE, VF/VT (Cardiac Arrest), Complete Heart Block: <1 %   ACC/AHA Guidelines for "Clearance":  Step 1 - Need for Emergency Surgery: No: Not acute  If Yes - go straight to OR with perioperative surveillance  Step 2 - Active Cardiac Conditions (Unstable Angina, Decompensated HF, Significant  Arrhytmias - Complete HB, Mobitz II, Symptomatic VT or SVT, Severe Aortic Stenosis - mean gradient > 40 mmHg, Valve area < 1.0 cm2):   No: No active angina  If Yes - Evaluate & Treat per ACC/AHA Guidelines  Step 3 -  Low Risk Surgery: No: Intermediate  If Yes --> proceed to OR  If No --> Step 4  Step 4 - Functional Capacity >= 4 METS without symptoms: Yes  If Yes --> proceed to OR  If No --> Step 5  Step 5 --  Clinical Risk Factors (CRF)  --> based on step 4 with no active cardiac symptoms, I would not proceed with an ischemic evaluation as it would not change my management at this time.  3 or more: No: At most one pending echo  If Yes -- assess Surgical Risk, --   (High Risk Non-cardiac), Intraabdominal or thoracic vascular surgery consider testing if it will change management.  Intermediate Risk: Proceed to OR with HR control, or consider testing if it will change management  1-2 or more CRFs: No: May be one pending echo results  If Yes -- assess Surgical Risk, --   (High Risk Non-cardiac), Intraabdominal or thoracic vascular surgery --> Proceed to OR, or  consider testing if it will change management.  Intermediate Risk: Proceed to OR with HR control, or consider testing if it will change management  No CRFs: Yes  If Yes --> Proceed to OR  We will check a 2-D echocardiogram simply to get a better assessment of his EF given his abnormal EKG, as this is normal, I see no reason to proceed with any Of Further Evaluation until His Vascular Surgery Is Complete.  I do think in the future, I would be reasonable to consider ischemic evaluation with potentially a CT angiogram. - This would not however change her management at present and therefore I would not want to do so until his peripheral ask her surgery is complete.

## 2016-07-25 NOTE — Assessment & Plan Note (Signed)
Not convinced that this is truly indicative of ischemia or prior infarction. He is not having any active anginal symptoms. Has not had any recollection of anginal symptoms. Plan: Check 2-D echocardiogram to assess LV systolic and diastolic function.

## 2016-07-27 ENCOUNTER — Ambulatory Visit (HOSPITAL_COMMUNITY)
Admission: RE | Admit: 2016-07-27 | Discharge: 2016-07-27 | Disposition: A | Discharge status: Home / Self Care | Payer: PRIVATE HEALTH INSURANCE | Source: Ambulatory Visit | Attending: Cardiology | Admitting: Cardiology

## 2016-07-27 DIAGNOSIS — R9431 Abnormal electrocardiogram [ECG] [EKG]: Secondary | ICD-10-CM | POA: Insufficient documentation

## 2016-07-27 DIAGNOSIS — Z01810 Encounter for preprocedural cardiovascular examination: Secondary | ICD-10-CM | POA: Insufficient documentation

## 2016-07-27 DIAGNOSIS — R0602 Shortness of breath: Secondary | ICD-10-CM | POA: Insufficient documentation

## 2016-07-28 ENCOUNTER — Encounter: Payer: Self-pay | Admitting: Surgery

## 2016-08-03 ENCOUNTER — Ambulatory Visit (INDEPENDENT_AMBULATORY_CARE_PROVIDER_SITE_OTHER): Payer: PRIVATE HEALTH INSURANCE | Admitting: Surgery

## 2016-08-03 ENCOUNTER — Encounter: Payer: Self-pay | Admitting: Surgery

## 2016-08-03 VITALS — BP 166/96 | HR 79 | Temp 98.4°F | Resp 20 | Ht 68.0 in | Wt 162.6 lb

## 2016-08-03 DIAGNOSIS — I70213 Atherosclerosis of native arteries of extremities with intermittent claudication, bilateral legs: Secondary | ICD-10-CM

## 2016-08-03 MED ORDER — CILOSTAZOL 100 MG PO TABS: 100.0000 mg | ORAL_TABLET | Freq: Two times a day (BID) | ORAL | 11 refills | Status: DC

## 2016-08-03 NOTE — Progress Notes (Signed)
Vascular and Vein Specialist of [facility]  Patient name: Benjamin [name] MRN: [id number] DOB: November 17, 1960 Sex: male   REASON FOR VISIT:    Follow up PAD  HISOTRY OF PRESENT ILLNESS:    Benjamin [name] is a 56 y.o. male who I met in May 2018 to evaluate his leg pain.  He has been having trouble with his leg pain for approximately one month.  The left leg is the most severe.  At that time, he can walk approximately one block before he developed cramping.  He was waking up at night with pain in his left leg that was improved by hanging the leg over the bed.  He was taken for angiography.  He was found to have occlusion of the left superficial femoral and popliteal artery with reconstitution of the posterior tibial artery.  There was diffuse disease throughout the right superficial femoral and popliteal artery with occlusion of the distal popliteal artery and reconstitution of the posterior tibial artery.  I sent him for cardiac clearance.  He was deemed to be low risk.    He is back today to discuss options.  He states that his leg pain is actually a little bit better.  He has been trying to walk further.  He continues to smoke.  He does not have any open wounds.  He is taking a statin and baby aspirin every day   PAST MEDICAL HISTORY:   Past Medical History:  Diagnosis Date  . Back pain   . Heavy smoker   . Hyperlipidemia LDL goal <70   . Peripheral arterial occlusive disease ([place]) 06/2013   Bilateral femoral artery disease     FAMILY HISTORY:   Family History  Problem Relation Age of Onset  . Arthritis Mother   . COPD Father   . Stroke Father   . Multiple sclerosis Sister     SOCIAL HISTORY:   Social History  Substance Use Topics  . Smoking status: Current Every Day Smoker    Packs/day: 1.50  . Smokeless tobacco: Never Used     Comment: At least 20 years  . Alcohol use No     ALLERGIES:   Allergies  Allergen Reactions  . Aspirin  Nausea And Vomiting  . Garlic Other (See Comments)    sneezing     CURRENT MEDICATIONS:   Current Outpatient Prescriptions  Medication Sig Dispense Refill  . acetaminophen (TYLENOL) 500 MG tablet Take 500 mg by mouth every 6 (six) hours as needed for mild pain or headache.    Marland Kitchen buPROPion (WELLBUTRIN) 100 MG tablet Take 1 tablet (100 mg total) by mouth 2 (two) times daily. 60 tablet 2  . diphenhydramine-acetaminophen (TYLENOL PM) 25-500 MG TABS tablet Take 1 tablet by mouth at bedtime as needed (sleep).    . lovastatin (MEVACOR) 20 MG tablet Take 1 tablet (20 mg total) by mouth daily. 90 tablet 1   No current facility-administered medications for this visit.     REVIEW OF SYSTEMS:   [X]  denotes positive finding, [ ]  denotes negative finding Cardiac  Comments:  Chest pain or chest pressure:    Shortness of breath upon exertion:    Short of breath when lying flat:    Irregular heart rhythm:        Vascular    Pain in calf, thigh, or hip brought on by ambulation: xx   Pain in feet at night that wakes you up from your sleep:  xx   Blood clot  in your veins:    Leg swelling:         Pulmonary    Oxygen at home:    Productive cough:     Wheezing:         Neurologic    Sudden weakness in arms or legs:     Sudden numbness in arms or legs:     Sudden onset of difficulty speaking or slurred speech:    Temporary loss of vision in one eye:     Problems with dizziness:         Gastrointestinal    Blood in stool:     Vomited blood:         Genitourinary    Burning when urinating:     Blood in urine:        Psychiatric    Major depression:         Hematologic    Bleeding problems:    Problems with blood clotting too easily:        Skin    Rashes or ulcers:        Constitutional    Fever or chills:      PHYSICAL EXAM:   Vitals:   08/03/16 1118  BP: (!) 156/101  Pulse: 79  Resp: 20  Temp: 98.4 F (36.9 C)  TempSrc: Oral  SpO2: 99%  Weight: 162 lb 9.6 oz  (73.8 kg)  Height: 5' 8"  (1.727 m)    GENERAL: The patient is a well-nourished male, in no acute distress. The vital signs are documented above. CARDIAC: There is a regular rate and rhythm.  VASCULAR: Nonpalpable pedal pulses PULMONARY: Non-labored respirations MUSCULOSKELETAL: There are no major deformities or cyanosis. NEUROLOGIC: No focal weakness or paresthesias are detected. SKIN: There are no ulcers or rashes noted. PSYCHIATRIC: The patient has a normal affect.  STUDIES:   I have reviewed his vein mapping.  He has Saphenous vein bilaterally  MEDICAL ISSUES:   Severe peripheral vascular disease, left greater than right:   I discussed with the patient, that he will need a tibial bypass on the left.  Before putting him through a distal bypass, I would like to optimize his medical management.  We detailed an exercise program and how it should be performed.  I would like for him to do this 3-5 times a week.  In addition I stressed the importance of smoking cessation.  I'm giving him a prescription for cilostazol.  We will try to maximize medical therapy prior to proceeding with bypass.  I had the patient scheduled to see back in the office in 3 months.  He will contact me sooner should he develop worsening pain or a nonhealing wound.    Annamarie Major, MD Vascular and Vein Specialists of [facility] [phone] Pager [phone]

## 2016-08-08 ENCOUNTER — Telehealth: Payer: Self-pay | Admitting: Cardiology

## 2016-08-08 ENCOUNTER — Encounter: Payer: Self-pay | Admitting: *Deleted

## 2016-08-08 NOTE — Telephone Encounter (Signed)
New message  Pt all requesting to speak with RN. Pt states he needs a letter to return back to work. Please call back to discuss

## 2016-08-08 NOTE — Telephone Encounter (Signed)
I would not be the one to do his return to work note. It would be to be his PCP or his Vascular surgeon  [facility]

## 2016-08-09 NOTE — Telephone Encounter (Signed)
Nada LibmanW Vance Brabham, MD (Vascular Surgery),

## 2016-08-09 NOTE — Telephone Encounter (Signed)
Pt notified he will call them

## 2016-09-05 ENCOUNTER — Ambulatory Visit (INDEPENDENT_AMBULATORY_CARE_PROVIDER_SITE_OTHER): Payer: PRIVATE HEALTH INSURANCE | Admitting: Cardiology

## 2016-09-05 ENCOUNTER — Encounter: Payer: Self-pay | Admitting: Cardiology

## 2016-09-05 VITALS — BP 134/94 | HR 68 | Ht 68.0 in | Wt 162.6 lb

## 2016-09-05 DIAGNOSIS — R931 Abnormal findings on diagnostic imaging of heart and coronary circulation: Secondary | ICD-10-CM | POA: Insufficient documentation

## 2016-09-05 DIAGNOSIS — R0602 Shortness of breath: Secondary | ICD-10-CM | POA: Diagnosis not present

## 2016-09-05 DIAGNOSIS — I739 Peripheral vascular disease, unspecified: Secondary | ICD-10-CM

## 2016-09-05 DIAGNOSIS — E785 Hyperlipidemia, unspecified: Secondary | ICD-10-CM | POA: Diagnosis not present

## 2016-09-05 DIAGNOSIS — F17200 Nicotine dependence, unspecified, uncomplicated: Secondary | ICD-10-CM

## 2016-09-05 DIAGNOSIS — I10 Essential (primary) hypertension: Secondary | ICD-10-CM | POA: Diagnosis not present

## 2016-09-05 NOTE — Patient Instructions (Addendum)
SCHEDULE AT [address] Your physician has requested that you have en exercise stress myoview. For further information please visit [email or web address]. Please follow instruction sheet, as given. ( PATIENT WANTED TO WAIT UNTIL SEPT 2018 TO HAVE TEST COMPLETED AND THEN SEE THE DOCTOR)  NO CHANGE WITH MEDICATIONS     Your physician recommends that you schedule a follow-up appointment in 1 MONTH WITH DR HARDING.

## 2016-09-05 NOTE — Progress Notes (Signed)
PCP: Jac Canavan, PA-C  Clinic Note: Chief Complaint  Patient presents with  . Follow-up    Shortness of breath. PAD. Abnormal echo.    HPI: Benjamin [name] is a 56 y.o. male with a PMH below who presents today for follow-up evaluation after initially being evaluated for perioperative artery vascular risk for vascular surgery. Benjamin [name] is a 56 y.o. male was initially seen on 07/25/2016 for the preoperative evaluation for possible PD surgery at the request of Marck, Mcclenny. He noted some mild exertional dyspnea, but was more limited by claudication than anything else. He stated he walked about every day for about 15+ minutes echo fourth to work and really was only med made about left leg claudication over the last few minutes. No dyspnea or chest tightness. I felt that he did not necessarily an ischemic evaluation for DVT procedure, but did order an echocardiogram that was performed and showed reduced ejection fraction.  Recent Hospitalizations: None - Follow-up with Dr. Myra Gianotti from vascular surgery. He noted the claudication was improved on medicines. He was started on a statin and baby aspirin. The plan was for a trial of medical management before invasive procedures.  Studies Personally Reviewed - (if available, images/films reviewed: From Epic Chart or Care Everywhere)  2-D echo 07/28/2016: EF 40-50%. Mildly reduced function. Diffuse hypokinesis. Otherwise normal.  Interval History: Benjamin [name] returns today noticing that he has less significant claudication symptoms since being started on Pletal by Dr. Myra Gianotti. He says that he is able to walk about a mile to mile and half before he really starts noticing the claudication now. He says he does a little bit short of breath, but is more limited as far as what makes him stop walking by his leg pain. He's not had any chest tightness or pressure - just notes that he will get short of breath but is able to walk through it more than  the claudication.Marland Kitchen He denies any PND, orthopnea or edema. No rapid irregular heartbeats or palpitations. He says some of his baseline shortness of breath is probably related to his long-term smoking. He is try to cut back is down to about half pack a day but still smoking.  He denies any syncope/near-syncope or TIA/amaurosis fugax.   ROS: A comprehensive was performed. Review of Systems  Constitutional: Negative for malaise/fatigue.  HENT: Negative for congestion and nosebleeds.   Respiratory: Positive for cough (Chronic daily cough. Not very productive). Negative for shortness of breath and wheezing.   Cardiovascular: Positive for claudication (Per history of present illness).  Gastrointestinal: Negative for blood in stool and melena.  Genitourinary: Negative for hematuria.  Musculoskeletal: Negative for falls, joint pain and myalgias.  Skin: Negative.   Neurological: Negative for dizziness and focal weakness.  Psychiatric/Behavioral: Negative for depression and memory loss. The patient is not nervous/anxious and does not have insomnia.   All other systems reviewed and are negative.  I have reviewed and (if needed) personally updated the patient's problem list, medications, allergies, past medical and surgical history, social and family history.   Past Medical History:  Diagnosis Date  . Back pain   . Heavy smoker   . Hyperlipidemia LDL goal <70   . 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   Bilateral femoral artery disease    Past Surgical History:  Procedure Laterality Date  . ABDOMINAL AORTOGRAM W/LOWER EXTREMITY N/A 07/12/2016   Procedure: Abdominal Aortogram w/Lower Extremity;  Surgeon: Nada Libman, MD;  Location:  [facility];  Service: Cardiovascular;  Laterality: N/A;  Bilateral extermity: Patent Renal As. No sig Dz in infrarenal Abd Aorta. Normal Bilat Iliac arteries. R SFA is 100% @ origin - recon in AK-Pop A. R PT A patent. L CFA occluded. L PFA recon  @ origin, L SFA occluded w/ recon in AK Pop A.  . COLONOSCOPY  06/2016   never  . NO PAST SURGERIES  06/2016    Current Meds  Medication Sig  . acetaminophen (TYLENOL) 500 MG tablet Take 500 mg by mouth every 6 (six) hours as needed for mild pain or headache.  Marland Kitchen buPROPion (WELLBUTRIN) 100 MG tablet Take 1 tablet (100 mg total) by mouth 2 (two) times daily.  . cilostazol (PLETAL) 100 MG tablet Take 1 tablet (100 mg total) by mouth 2 (two) times daily before a meal.  . diphenhydramine-acetaminophen (TYLENOL PM) 25-500 MG TABS tablet Take 1 tablet by mouth at bedtime as needed (sleep).  . lovastatin (MEVACOR) 20 MG tablet Take 1 tablet (20 mg total) by mouth daily.    Allergies  Allergen Reactions  . Aspirin Nausea And Vomiting  . Garlic Other (See Comments)    sneezing    Social History   Social History  . Marital status: Single    Spouse name: N/A  . Number of children: N/A  . Years of education: 43   Social History Main Topics  . Smoking status: Current Every Day Smoker    Packs/day: 1.50  . Smokeless tobacco: Never Used     Comment: At least 20 years  . Alcohol use No  . Drug use: No  . Sexual activity: Not Currently   Other Topics Concern  . 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   He lives with his sister and her son.   Walks every day to work at least 15+ minutes.    family history includes Arthritis in his mother; COPD in his father; Multiple sclerosis in his sister; Stroke in his father.  Wt Readings from Last 3 Encounters:  09/05/16 162 lb 9.6 oz (73.8 kg)  08/03/16 162 lb 9.6 oz (73.8 kg)  07/25/16 156 lb 3.2 oz (70.9 kg)    PHYSICAL EXAM BP (!) 134/94   Pulse 68   Ht 5\' 8"  (1.727 m)   Wt 162 lb 9.6 oz (73.8 kg)   SpO2 95%   BMI 24.72 kg/m  General appearance: alert, cooperative, appears stated age, no distress. He does smell of cigarettes. But otherwise appears well-nourished and well-groomed. HEENT:  [place]/AT, EOMI, MMM, anicteric sclera Neck: no adenopathy, no carotid bruit and no JVD Lungs: clear to auscultation bilaterally, normal percussion bilaterally and non-labored with diffuse late expiratory wheezing. Heart: regular rate and rhythm, S1 &S2 normal, no murmur, click, rub or gallop; nondisplaced PMI Abdomen: soft, non-tender; bowel sounds normal; no masses,  no organomegaly; no HJR Extremities: extremities normal, atraumatic, no cyanosis, or edema . Mild clubbing of the nails. Pulses: 2+ and symmetric radial pulses. Diminished bilateral pedal pulses with left most notably worse than right.  Neurologic: Mental status: Alert & oriented x 3, thought content appropriate; non-focal exam.  Pleasant mood & affect.    Adult ECG Report  None   Other studies Reviewed: Additional studies/ records that were reviewed today include:  Recent Labs:   Lab Results  Component Value Date   CHOL 192 06/24/2016   HDL 59 06/24/2016   LDLCALC 107 (  H) 06/24/2016   TRIG 128 06/24/2016   CHOLHDL 3.3 06/24/2016   Lab Results  Component Value Date   CREATININE 0.90 07/12/2016   BUN 15 07/12/2016   NA 141 07/12/2016   K 3.7 07/12/2016   CL 106 07/12/2016   CO2 25 06/24/2016     ASSESSMENT / PLAN: Problem List Items Addressed This Visit    Abnormal echocardiogram (Chronic)    Reduced ejection fraction with no clear-cut etiology in a patient with significant PAD and claudication. He is a smoker with borderline pressures and dyslipidemia. High likelihood that he would have coronary disease. Potential options for evaluation would be a coronary CTA versus nuclear stress test. For now we will proceed with Myoview stress test.      Relevant Orders   Myocardial Perfusion Imaging   Dyslipidemia, goal LDL below 70 (Chronic)    Started on lovastatin by his PCP. He has not had follow-up labs since starting that and the baseline LDL was 107. Low threshold for titrating up therapy. Mevacor is a relatively  low potency statin.      High blood pressure    Borderline pressures again today with somewhat elevated diastolic pressures. Will need to follow-up closely with low threshold to treat.      Needs smoking cessation education    I did speak to him briefly about importance of smoking cessation. He says he is chronically cut back and has reduced from 1 pack per day down to one half pack per day. I discussed potential options for quitting. He says that he will "look into them ". He has interest in quitting, but is not yet ready.      Peripheral vascular disease ([facility]) - Primary (Chronic)    Significant femoral artery disease. Apparently the plan is for medical management at this present time. He was started on lovastatin by his PCP. He is also on aspirin although it is not listed. Symptoms relatively controlled with Pletal at this present time.  He needs to quit smoking.      Relevant Orders   Myocardial Perfusion Imaging   Shortness of breath on exertion (Chronic)    He does have some exertional dyspnea, more limited however by claudication. I am a bit concerned with the reduced EF on echo. Given the extent of PAD and reduced ejection fraction, I think we need to exclude ischemic CAD.  Plan: Myoview stress test      Relevant Orders   Myocardial Perfusion Imaging      Current medicines are reviewed at length with the patient today. (+/- concerns) n/a The following changes have been made: n/a  Patient Instructions  SCHEDULE AT [address] Your physician has requested that you have en exercise stress myoview. For further information please visit [email or web address]. Please follow instruction sheet, as given. ( PATIENT WANTED TO WAIT UNTIL SEPT 2018 TO HAVE TEST COMPLETED AND THEN SEE THE DOCTOR)  NO CHANGE WITH MEDICATIONS     Your physician recommends that you schedule a follow-up appointment in 1 MONTH WITH DR Emari Hreha.    Studies Ordered:   Orders  Placed This Encounter  Procedures  . Myocardial Perfusion Imaging      Bryan Lemmaavid Perl Kerney, M.D., M.S. Interventional Cardiologist   Pager # [phone] Phone # [phone] [place] [id number]

## 2016-09-06 ENCOUNTER — Encounter: Payer: Self-pay | Admitting: Cardiology

## 2016-09-06 DIAGNOSIS — F17200 Nicotine dependence, unspecified, uncomplicated: Secondary | ICD-10-CM | POA: Insufficient documentation

## 2016-09-06 NOTE — Assessment & Plan Note (Signed)
Borderline pressures again today with somewhat elevated diastolic pressures. Will need to follow-up closely with low threshold to treat.

## 2016-09-06 NOTE — Assessment & Plan Note (Signed)
Started on lovastatin by his PCP. He has not had follow-up labs since starting that and the baseline LDL was 107. Low threshold for titrating up therapy. Mevacor is a relatively low potency statin.

## 2016-09-06 NOTE — Assessment & Plan Note (Signed)
I did speak to him briefly about importance of smoking cessation. He says he is chronically cut back and has reduced from 1 pack per day down to one half pack per day. I discussed potential options for quitting. He says that he will "look into them ". He has interest in quitting, but is not yet ready.

## 2016-09-06 NOTE — Assessment & Plan Note (Signed)
Reduced ejection fraction with no clear-cut etiology in a patient with significant PAD and claudication. He is a smoker with borderline pressures and dyslipidemia. High likelihood that he would have coronary disease. Potential options for evaluation would be a coronary CTA versus nuclear stress test. For now we will proceed with Myoview stress test.

## 2016-09-06 NOTE — Assessment & Plan Note (Signed)
Significant femoral artery disease. Apparently the plan is for medical management at this present time. He was started on lovastatin by his PCP. He is also on aspirin although it is not listed. Symptoms relatively controlled with Pletal at this present time.  He needs to quit smoking.

## 2016-09-06 NOTE — Assessment & Plan Note (Signed)
He does have some exertional dyspnea, more limited however by claudication. I am a bit concerned with the reduced EF on echo. Given the extent of PAD and reduced ejection fraction, I think we need to exclude ischemic CAD.  Plan: Myoview stress test

## 2016-09-07 ENCOUNTER — Telehealth: Payer: Self-pay | Admitting: Medical

## 2016-09-07 NOTE — Telephone Encounter (Signed)
Going to go to work and look at his work schedule and call me back to schedule his appt

## 2016-09-07 NOTE — Telephone Encounter (Signed)
Get him in for f/u med check, fasting

## 2016-10-17 ENCOUNTER — Ambulatory Visit: Payer: PRIVATE HEALTH INSURANCE | Admitting: Cardiology

## 2016-10-25 ENCOUNTER — Telehealth (HOSPITAL_COMMUNITY): Payer: Self-pay

## 2016-10-25 NOTE — Telephone Encounter (Signed)
Encounter complete. 

## 2016-10-26 ENCOUNTER — Telehealth (HOSPITAL_COMMUNITY): Payer: Self-pay

## 2016-10-26 NOTE — Telephone Encounter (Signed)
Encounter complete. 

## 2016-10-27 ENCOUNTER — Inpatient Hospital Stay (HOSPITAL_COMMUNITY): Admission: RE | Admit: 2016-10-27 | Payer: PRIVATE HEALTH INSURANCE | Source: Ambulatory Visit

## 2016-11-02 ENCOUNTER — Telehealth (HOSPITAL_COMMUNITY): Payer: Self-pay

## 2016-11-02 NOTE — Telephone Encounter (Signed)
Encounter complete. 

## 2016-11-03 ENCOUNTER — Telehealth (HOSPITAL_COMMUNITY): Payer: Self-pay

## 2016-11-03 NOTE — Telephone Encounter (Signed)
Encounter complete. 

## 2016-11-04 ENCOUNTER — Ambulatory Visit (HOSPITAL_COMMUNITY)
Admission: RE | Admit: 2016-11-04 | Payer: PRIVATE HEALTH INSURANCE | Source: Ambulatory Visit | Attending: Cardiology | Admitting: Cardiology

## 2016-11-07 ENCOUNTER — Ambulatory Visit: Payer: PRIVATE HEALTH INSURANCE | Admitting: Surgery

## 2016-11-09 ENCOUNTER — Ambulatory Visit: Payer: PRIVATE HEALTH INSURANCE | Admitting: Cardiology

## 2016-11-22 ENCOUNTER — Telehealth (HOSPITAL_COMMUNITY): Payer: Self-pay | Admitting: Cardiology

## 2016-11-24 NOTE — Telephone Encounter (Signed)
User: Trina Ao A Date/time: 11/23/16 2:14 PM  Comment: Phone went straight to VM and was unable to lmsg to move his appt time for nuc appt on 10/12.   Context:  Outcome: Not Available  Phone number: [phone] Phone Type: Home Phone  Comm. type: Telephone Call type: Outgoing  Contact: [name] Relation to patient: Self    User: Trina Ao A Date/time: 11/22/16 12:46 PM  Comment: Tried to call patient to move his appt time on 10/12 up to an earlier time and was unable to leave a message due to VM being full   Context:  Outcome: No Answer/Busy  Phone number: [phone] Phone Type: Home Phone  Comm. type: Telephone Call type: Outgoing

## 2016-11-30 ENCOUNTER — Telehealth (HOSPITAL_COMMUNITY): Payer: Self-pay

## 2016-11-30 NOTE — Telephone Encounter (Signed)
Encounter complete. 

## 2016-12-02 ENCOUNTER — Encounter (HOSPITAL_COMMUNITY): Payer: Self-pay

## 2016-12-02 ENCOUNTER — Ambulatory Visit (HOSPITAL_COMMUNITY)
Admission: RE | Admit: 2016-12-02 | Discharge: 2016-12-02 | Disposition: A | Discharge status: Home / Self Care | Payer: PRIVATE HEALTH INSURANCE | Source: Ambulatory Visit | Attending: Cardiology | Admitting: Cardiology

## 2016-12-02 DIAGNOSIS — R931 Abnormal findings on diagnostic imaging of heart and coronary circulation: Secondary | ICD-10-CM

## 2016-12-02 DIAGNOSIS — R0602 Shortness of breath: Secondary | ICD-10-CM

## 2016-12-02 DIAGNOSIS — I739 Peripheral vascular disease, unspecified: Secondary | ICD-10-CM

## 2017-01-11 ENCOUNTER — Telehealth (HOSPITAL_COMMUNITY): Payer: Self-pay

## 2017-01-11 ENCOUNTER — Ambulatory Visit (HOSPITAL_COMMUNITY): Admission: RE | Admit: 2017-01-11 | Payer: PRIVATE HEALTH INSURANCE | Source: Ambulatory Visit

## 2017-01-11 NOTE — Telephone Encounter (Signed)
Close encounter 

## 2017-01-18 ENCOUNTER — Telehealth (HOSPITAL_COMMUNITY): Payer: Self-pay | Admitting: Cardiology

## 2017-01-18 NOTE — Telephone Encounter (Signed)
12/06/16 Called pt and was unable to lmsg due to VM being full..RG  01/18/17 Called pt and was not able to lmsg due to VM being full.[name].RG  Patient cxed appt on 10/12(he had coffee) Patient no-showed 11/21  User: [name], [name] Date/time: 01/18/2017 9:56 AM  Comment: Called pt and was not able to lmsg due to VM being full..RG  Context: [place] Schedule Orders/Appt Requests Outcome: No Answer/Busy  Phone number: [phone] Phone Type: Home Phone  Comm. type: Telephone Call type: Outgoing  Contact: [name], [name] Relation to patient: Self  Letter:      User: [name], [name] Date/time: 12/06/2016 1:57 PM  Comment: Called pt and was unable to lmsg due to VM being full..RG  Context: [place] Schedule Orders/Appt Requests Outcome: No Answer/Busy  Phone number: [phone] Phone Type: Home Phone  Comm. type: Telephone Call type: Outgoing  Contact: [name], [name] Relation to patient: Self  Letter:

## 2017-01-20 ENCOUNTER — Ambulatory Visit (INDEPENDENT_AMBULATORY_CARE_PROVIDER_SITE_OTHER): Payer: PRIVATE HEALTH INSURANCE | Admitting: Medical

## 2017-01-20 ENCOUNTER — Encounter: Payer: Self-pay | Admitting: Medical

## 2017-01-20 VITALS — BP 130/82 | Wt 161.4 lb

## 2017-01-20 DIAGNOSIS — R0989 Other specified symptoms and signs involving the circulatory and respiratory systems: Secondary | ICD-10-CM

## 2017-01-20 DIAGNOSIS — Z23 Encounter for immunization: Secondary | ICD-10-CM | POA: Insufficient documentation

## 2017-01-20 DIAGNOSIS — E785 Hyperlipidemia, unspecified: Secondary | ICD-10-CM | POA: Diagnosis not present

## 2017-01-20 DIAGNOSIS — F17200 Nicotine dependence, unspecified, uncomplicated: Secondary | ICD-10-CM

## 2017-01-20 DIAGNOSIS — I739 Peripheral vascular disease, unspecified: Secondary | ICD-10-CM | POA: Diagnosis not present

## 2017-01-20 DIAGNOSIS — M79672 Pain in left foot: Secondary | ICD-10-CM | POA: Diagnosis not present

## 2017-01-20 DIAGNOSIS — I8390 Asymptomatic varicose veins of unspecified lower extremity: Secondary | ICD-10-CM | POA: Diagnosis not present

## 2017-01-20 LAB — COMPREHENSIVE METABOLIC PANEL
AG RATIO: 1.9 (calc) (ref 1.0–2.5)
ALKALINE PHOSPHATASE (APISO): 89 U/L (ref 40–115)
ALT: 11 U/L (ref 9–46)
AST: 17 U/L (ref 10–35)
Albumin: 4.2 g/dL (ref 3.6–5.1)
BILIRUBIN TOTAL: 0.6 mg/dL (ref 0.2–1.2)
BUN: 12 mg/dL (ref 7–25)
CALCIUM: 9.2 mg/dL (ref 8.6–10.3)
CO2: 28 mmol/L (ref 20–32)
Chloride: 107 mmol/L (ref 98–110)
Creat: 1.02 mg/dL (ref 0.70–1.33)
Globulin: 2.2 g/dL (calc) (ref 1.9–3.7)
Glucose, Bld: 88 mg/dL (ref 65–99)
Potassium: 4 mmol/L (ref 3.5–5.3)
SODIUM: 141 mmol/L (ref 135–146)
TOTAL PROTEIN: 6.4 g/dL (ref 6.1–8.1)

## 2017-01-20 LAB — LIPID PANEL
CHOLESTEROL: 180 mg/dL (ref <200)
HDL: 56 mg/dL (ref >40)
LDL Cholesterol (Calc): 104 mg/dL (calc) — ABNORMAL HIGH
Non-HDL Cholesterol (Calc): 124 mg/dL (calc) (ref <130)
TRIGLYCERIDES: 103 mg/dL (ref <150)
Total CHOL/HDL Ratio: 3.2 (calc) (ref <5.0)

## 2017-01-20 LAB — URIC ACID: URIC ACID, SERUM: 5.5 mg/dL (ref 4.0–8.0)

## 2017-01-20 MED ORDER — ROSUVASTATIN CALCIUM 20 MG PO TABS: 20.0000 mg | ORAL_TABLET | Freq: Every day | ORAL | 3 refills | Status: DC

## 2017-01-20 MED ORDER — GABAPENTIN 100 MG PO CAPS: ORAL_CAPSULE | ORAL | 2 refills | Status: DC

## 2017-01-20 NOTE — Patient Instructions (Addendum)
Recommendations:  I want to change  your cholesterol medication to Crestor as it is stronger than what you are taking.  If this is too expensive let me know  Begin gabapentin medication to help with the foot pain.  Start out 1 capsule at bedtime for 2 weeks then go to 2 capsules at bedtime daily  Make sure you are trying to get exercise regularly to help improve blood flow.  This could be a daily walking program or using a stationary bicycle.  We updated labs today, and we will call back with results  You are due back to see both the cardiologist and vascular doctor at this time  Please make a follow-up appointment with Dr. Estanislado SpireBrabham's office, and call Dr. Elissa HeftyHarding's office to get on the schedule for a stress test  I am concerned that you may have blockages in the heart as well as your leg   continue the Pletal medication   please find a way to stop smoking completely!  In the event you may have heel spurs, you can use over-the-counter heel gel cups that you put in your shoe

## 2017-01-20 NOTE — Progress Notes (Signed)
Subjective: Chief Complaint  Patient presents with  . Leg Pain    leg and foot pain    Here today for leg and foot pain.  He notes bottom of left foot has been hurting, thinks it a corn or callus.  Every time he walks or on feet has some pain.  Has to sit down when the leg and foot feels sore.  No recent injury, fall or trauma.    He has a history significant for tobacco use, hyperlipidemia, peripheral arterial disease.  He saw Dr. Myra GianottiBrabham in June 2018 for severe peripheral vascular disease worse on the left and the right lower extremity.  At that time he was advised that he would need tibial bypass on the left.  He was started on cilostazol.  It was recommended to maximize medical management as well.  He was supposed to return in 3 months.  He had also had a consult visit with Dr. Herbie BaltimoreHarding, cardiology in July 2018, was supposed to have a Myoview stress test but has either no showed or canceled several appointments there.  Still smoking but has cut down a bit.  Still taking Mevacor and Pletal.   He is non fasting today.    Past Medical History:  Diagnosis Date  . Back pain   . Heavy smoker   . Hyperlipidemia LDL goal <70   . 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   Bilateral femoral artery disease   Current Outpatient Medications on File Prior to Visit  Medication Sig Dispense Refill  . acetaminophen (TYLENOL) 500 MG tablet Take 500 mg by mouth every 6 (six) hours as needed for mild pain or headache.    Marland Kitchen. buPROPion (WELLBUTRIN) 100 MG tablet Take 1 tablet (100 mg total) by mouth 2 (two) times daily. 60 tablet 2  . cilostazol (PLETAL) 100 MG tablet Take 1 tablet (100 mg total) by mouth 2 (two) times daily before a meal. 60 tablet 11   No current facility-administered medications on file prior to visit.    ROS as in subjective   Objective: BP 130/82   Wt 161 lb 6.4 oz (73.2 kg)   BMI 24.54 kg/m   Wt Readings from Last 3 Encounters:  01/20/17 161 lb 6.4 oz (73.2 kg)   09/05/16 162 lb 9.6 oz (73.8 kg)  08/03/16 162 lb 9.6 oz (73.8 kg)   BP Readings from Last 3 Encounters:  01/20/17 130/82  09/05/16 (!) 134/94  08/03/16 (!) 166/96   General: Well-developed well-nourished no acute distress Heart: RRR, normal S1, S2, no murmurs Lungs clear Left leg with barely palpable pulses, but no cold extremity no obvious sores or foot lesions Left foot and leg is non tender, there is moderate varicosities of the left lower leg.  No obvious deformity. Right foot and leg non tender, 1+ pulses.  Legs nontneder otherwise, no swelling    Assessment: Encounter Diagnoses  Name Primary?  . Peripheral vascular disease (HCC) Yes  . Need for influenza vaccination   . Foot pain, left   . Varicose veins of lower extremity, unspecified laterality, unspecified whether complicated   . Dyslipidemia, goal LDL below 70   . Decreased pedal pulses   . Heavy smoker   . Intermittent claudication (HCC)     Plan: We discussed his symptoms and concerns.  I reviewed cardiology and vascular surgery notes from the summertime.  He is due back to see both specialist.  He is also due for stress test of the heart.  We  discussed the importance of following up for these evaluations.  We discussed the recommendations below  Patient Instructions  Recommendations:  I want to change  your cholesterol medication to Crestor as it is stronger than what you are taking.  If this is too expensive let me know  Begin gabapentin medication to help with the foot pain.  Start out 1 capsule at bedtime for 2 weeks then go to 2 capsules at bedtime daily  Make sure you are trying to get exercise regularly to help improve blood flow.  This could be a daily walking program or using a stationary bicycle.  We updated labs today, and we will call back with results  You are due back to see both the cardiologist and vascular doctor at this time  Please make a follow-up appointment with Dr. Estanislado SpireBrabham's  office, and call Dr. Elissa HeftyHarding's office to get on the schedule for a stress test  I am concerned that you may have blockages in the heart as well as your leg   continue the Pletal medication   please find a way to stop smoking completely!    Counseled on the influenza virus vaccine.  Vaccine information sheet given.  Influenza vaccine given after consent obtained.  Benjamin [name] was seen today for leg pain.  Diagnoses and all orders for this visit:  Peripheral vascular disease (HCC) -     Lipid panel -     Comprehensive metabolic panel  Need for influenza vaccination -     Flu Vaccine QUAD 6+ mos PF IM (Fluarix Quad PF)  Foot pain, left -     Uric acid  Varicose veins of lower extremity, unspecified laterality, unspecified whether complicated  Dyslipidemia, goal LDL below 70 -     Lipid panel  Decreased pedal pulses  Heavy smoker  Intermittent claudication (HCC)  Other orders -     rosuvastatin (CRESTOR) 20 MG tablet; Take 1 tablet (20 mg total) by mouth daily. -     gabapentin (NEURONTIN) 100 MG capsule; Begin 1 capsule QHS, then go to 2 capsules QHS after 2 weeks

## 2017-01-24 ENCOUNTER — Other Ambulatory Visit: Payer: Self-pay | Admitting: Medical

## 2017-01-24 MED ORDER — BUPROPION HCL 100 MG PO TABS: 100.0000 mg | ORAL_TABLET | Freq: Two times a day (BID) | ORAL | 2 refills | Status: DC

## 2017-04-27 ENCOUNTER — Emergency Department (HOSPITAL_COMMUNITY)
Admission: EM | Admit: 2017-04-27 | Discharge: 2017-04-28 | Disposition: A | Discharge status: Home / Self Care | Payer: PRIVATE HEALTH INSURANCE | Attending: Emergency Medicine | Admitting: Emergency Medicine

## 2017-04-27 ENCOUNTER — Encounter (HOSPITAL_COMMUNITY): Payer: Self-pay | Admitting: Emergency Medicine

## 2017-04-27 ENCOUNTER — Other Ambulatory Visit: Payer: Self-pay

## 2017-04-27 DIAGNOSIS — R079 Chest pain, unspecified: Secondary | ICD-10-CM | POA: Insufficient documentation

## 2017-04-27 DIAGNOSIS — F17201 Nicotine dependence, unspecified, in remission: Secondary | ICD-10-CM | POA: Insufficient documentation

## 2017-04-27 DIAGNOSIS — R1013 Epigastric pain: Secondary | ICD-10-CM | POA: Insufficient documentation

## 2017-04-27 DIAGNOSIS — I10 Essential (primary) hypertension: Secondary | ICD-10-CM | POA: Insufficient documentation

## 2017-04-27 DIAGNOSIS — Z79899 Other long term (current) drug therapy: Secondary | ICD-10-CM | POA: Insufficient documentation

## 2017-04-27 LAB — BASIC METABOLIC PANEL
ANION GAP: 12 (ref 5–15)
BUN: 13 mg/dL (ref 6–20)
CHLORIDE: 105 mmol/L (ref 101–111)
CO2: 22 mmol/L (ref 22–32)
Calcium: 9.5 mg/dL (ref 8.9–10.3)
Creatinine, Ser: 1.05 mg/dL (ref 0.61–1.24)
Glucose, Bld: 127 mg/dL — ABNORMAL HIGH (ref 65–99)
POTASSIUM: 3.2 mmol/L — AB (ref 3.5–5.1)
SODIUM: 139 mmol/L (ref 135–145)

## 2017-04-27 LAB — CBC
HEMATOCRIT: 42 % (ref 39.0–52.0)
HEMOGLOBIN: 14.9 g/dL (ref 13.0–17.0)
MCH: 29.3 pg (ref 26.0–34.0)
MCHC: 35.5 g/dL (ref 30.0–36.0)
MCV: 82.7 fL (ref 78.0–100.0)
Platelets: 246 10*3/uL (ref 150–400)
RBC: 5.08 MIL/uL (ref 4.22–5.81)
RDW: 13.5 % (ref 11.5–15.5)
WBC: 11.8 10*3/uL — AB (ref 4.0–10.5)

## 2017-04-27 LAB — I-STAT TROPONIN, ED: Troponin i, poc: 0.01 ng/mL (ref 0.00–0.08)

## 2017-04-27 NOTE — ED Provider Notes (Signed)
[name] EM[facility]GENCY [facility] Provider Note   CSN: [id number] Arrival date & time: 04/27/17  2135     History   Chief Complaint Chief Complaint  Patient presents with  . Chest Pain    HPI Benjamin [name] is a 57 y.o. male.  Patient is a 57 year old male with past medical history of peripheral vascular disease, hypertension, tobacco use.  He presents today for evaluation of chest discomfort.  This started after he ate a meal along with stacker 2 energy pills.  He stated he developed stomach upset and tightness in his upper abdomen/chest.  This lasted for several minutes, then resolved after he vomited.  He is now symptom-free and feels better.  He denies any diarrhea, fevers, or shortness of breath.  Patient tells me his symptoms began right around 8:00 this evening.   The history is provided by the patient.  Chest Pain   This is a new problem. The problem occurs constantly. The problem has been resolved. The pain is present in the epigastric region. The pain is moderate. The quality of the pain is described as pressure-like. The pain does not radiate. Associated symptoms include nausea and vomiting. Pertinent negatives include no diaphoresis and no shortness of breath.    Past Medical History:  Diagnosis Date  . Back pain   . Heavy smoker   . Hyperlipidemia LDL goal <70   . 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   Bilateral femoral artery disease    Patient Active Problem List   Diagnosis Date Noted  . Intermittent claudication (HCC) 01/20/2017  . Need for influenza vaccination 01/20/2017  . Needs smoking cessation education 09/06/2016  . Abnormal echocardiogram 09/05/2016  . Abnormal EKG 07/25/2016  . Preoperative cardiovascular examination 07/25/2016  . Shortness of breath on exertion 07/25/2016  . Dyslipidemia, goal LDL below 70 07/25/2016  . High blood pressure 07/25/2016  . Abnormal chest x-ray 07/04/2016  . Peripheral vascular  disease (HCC) 07/04/2016  . Decreased pedal pulses 06/24/2016  . Varicose vein of leg 06/24/2016  . Heavy smoker 06/24/2016  . Abnormal weight loss 06/24/2016  . Foot pain, left 06/24/2016  . Screening for prostate cancer 06/24/2016    Past Surgical History:  Procedure Laterality Date  . ABDOMINAL AORTOGRAM W/LOWER EXTREMITY N/A 07/12/2016   Procedure: Abdominal Aortogram w/Lower Extremity;  Surgeon: Nada LibmanBrabham, Vance W, MD;  Location: [facility] INVASIVE CV LAB;  Service: Cardiovascular;  Laterality: N/A;  Bilateral extermity: Patent Renal As. No sig Dz in infrarenal Abd Aorta. Normal Bilat Iliac arteries. R SFA is 100% @ origin - recon in [place]-Pop A. R PT A patent. L CFA occluded. L PFA recon @ origin, L SFA occluded w/ recon in [place] Pop A.  . COLONOSCOPY  06/2016   never  . NO PAST SURGERIES  06/2016       Home Medications    Prior to Admission medications   Medication Sig Start Date End Date Taking? Authorizing Provider  acetaminophen (TYLENOL) 500 MG tablet Take 500 mg by mouth every 6 (six) hours as needed for mild pain or headache.    [provider]  buPROPion (WELLBUTRIN) 100 MG tablet Take 1 tablet (100 mg total) by mouth 2 (two) times daily. 01/24/17 01/24/18  Tysinger, Kermit Baloavid S, PA-C  cilostazol (PLETAL) 100 MG tablet Take 1 tablet (100 mg total) by mouth 2 (two) times daily before a meal. 08/03/16   Nada LibmanBrabham, Vance W, MD  gabapentin (NEURONTIN) 100 MG capsule Begin 1 capsule QHS, then  go to 2 capsules QHS after 2 weeks 01/20/17   Tysinger, Kermit Balo, PA-C  rosuvastatin (CRESTOR) 20 MG tablet Take 1 tablet (20 mg total) by mouth daily. 01/20/17   Tysinger, Kermit Balo, PA-C    Family History Family History  Problem Relation Age of Onset  . Arthritis Mother   . COPD Father   . Stroke Father   . Multiple sclerosis Sister     Social History Social History   Tobacco Use  . Smoking status: Current Every Day Smoker    Packs/day: 1.50  . Smokeless tobacco: Never Used  . Tobacco  comment: At least 20 years  Substance Use Topics  . Alcohol use: No  . Drug use: No     Allergies   Aspirin and Garlic   Review of Systems Review of Systems  Constitutional: Negative for diaphoresis.  Respiratory: Negative for shortness of breath.   Cardiovascular: Positive for chest pain.  Gastrointestinal: Positive for nausea and vomiting.  All other systems reviewed and are negative.    Physical Exam Updated Vital Signs Wt 72.6 kg (160 lb)   BMI 24.33 kg/m   Physical Exam  Constitutional: He is oriented to person, place, and time. He appears well-developed and well-nourished. No distress.  HENT:  Head: Normocephalic and atraumatic.  Mouth/Throat: Oropharynx is clear and moist.  Neck: Normal range of motion. Neck supple.  Cardiovascular: Normal rate and regular rhythm. Exam reveals no friction rub.  No murmur heard. Pulmonary/Chest: Effort normal and breath sounds normal. No respiratory distress. He has no wheezes. He has no rales.  Abdominal: Soft. Bowel sounds are normal. He exhibits no distension. There is no tenderness.  Musculoskeletal: Normal range of motion. He exhibits no edema.  Neurological: He is alert and oriented to person, place, and time. Coordination normal.  Skin: Skin is warm and dry. He is not diaphoretic.  Nursing note and vitals reviewed.    ED Treatments / Results  Labs (all labs ordered are listed, but only abnormal results are displayed) Labs Reviewed  BASIC METABOLIC PANEL - Abnormal; Notable for the following components:      Result Value   Potassium 3.2 (*)    Glucose, Bld 127 (*)    All other components within normal limits  CBC - Abnormal; Notable for the following components:   WBC 11.8 (*)    All other components within normal limits  I-STAT TROPONIN, ED    EKG  EKG Interpretation  Date/Time:  Thursday April 27 2017 22:02:36 EST Ventricular Rate:  77 PR Interval:  126 QRS Duration: 90 QT Interval:  406 QTC  Calculation: 459 R Axis:   9 Text Interpretation:  Normal sinus rhythm with sinus arrhythmia Septal infarct , age undetermined Abnormal ECG No acute changes Confirmed by Derwood Kaplan [phone]) on 04/27/2017 10:08:03 PM       Radiology No results found.  Procedures Procedures (including critical care time)  Medications Ordered in ED Medications - No data to display   Initial Impression / Assessment and Plan / ED Course  I have reviewed the triage vital signs and the nursing notes.  Pertinent labs & imaging results that were available during my care of the patient were reviewed by me and considered in my medical decision making (see chart for details).  Patient presenting with complaints of chest discomfort.  This started after eating and taking energy supplements.  He felt very nauseated, then vomited and instantly felt better.  I strongly believe his symptoms are GI  in nature.  There is nothing to suggest a cardiac etiology.  His EKG is unchanged and troponin x2 is negative.  He will be discharged, to return as needed for any problems.  Final Clinical Impressions(s) / ED Diagnoses   Final diagnoses:  None    ED Discharge Orders    None       Geoffery Lyons, MD 04/28/17 [phone]

## 2017-04-27 NOTE — ED Triage Notes (Addendum)
EMS called for sudden onset chest pain with dizziness. States 15 min later he vomited x 1, states he has felt fine since. Pain / for EMS, 324 ASA, EMS placed IV and pain 0/10 after IV placement. NSR on EKG. 18 LAC, AxO x4.

## 2017-04-27 NOTE — ED Notes (Signed)
Denies chest pain, states he took 2 Stacker pills and ate dinner, had chest pain, (acid reflux) then vomited and felt better. Pain 0/10 at arrival

## 2017-04-27 NOTE — ED Notes (Signed)
ED Provider at bedside. 

## 2017-04-28 ENCOUNTER — Emergency Department (HOSPITAL_COMMUNITY): Payer: PRIVATE HEALTH INSURANCE

## 2017-04-28 LAB — I-STAT TROPONIN, ED: Troponin i, poc: 0 ng/mL (ref 0.00–0.08)

## 2017-04-28 NOTE — Discharge Instructions (Signed)
Return to the emergency department if you experience any new or concerning symptoms. °

## 2017-04-28 NOTE — ED Notes (Signed)
PT states understanding of care given, follow up care, and medication prescribed. PT ambulated from ED to car with a steady gait. 

## 2017-04-28 NOTE — ED Notes (Signed)
Patient transported to X-ray 

## 2017-05-05 ENCOUNTER — Inpatient Hospital Stay: Payer: PRIVATE HEALTH INSURANCE | Admitting: Medical

## 2018-03-08 ENCOUNTER — Telehealth: Payer: Self-pay | Admitting: Medical

## 2018-03-08 NOTE — Telephone Encounter (Signed)
Dismissal letter in guarantor snapshot  °

## 2018-04-21 ENCOUNTER — Encounter (HOSPITAL_COMMUNITY): Payer: Self-pay | Admitting: Emergency Medicine

## 2018-04-21 ENCOUNTER — Ambulatory Visit (HOSPITAL_COMMUNITY)
Admission: EM | Admit: 2018-04-21 | Discharge: 2018-04-21 | Disposition: A | Discharge status: Home / Self Care | Payer: Managed Care, Other (non HMO) | Attending: Family Medicine | Admitting: Family Medicine

## 2018-04-21 DIAGNOSIS — R030 Elevated blood-pressure reading, without diagnosis of hypertension: Secondary | ICD-10-CM | POA: Diagnosis not present

## 2018-04-21 LAB — BASIC METABOLIC PANEL
Anion gap: 7 (ref 5–15)
BUN: 11 mg/dL (ref 6–20)
CHLORIDE: 108 mmol/L (ref 98–111)
CO2: 25 mmol/L (ref 22–32)
CREATININE: 1.01 mg/dL (ref 0.61–1.24)
Calcium: 9.1 mg/dL (ref 8.9–10.3)
GFR calc Af Amer: >60 mL/min (ref >60)
GFR calc non Af Amer: >60 mL/min (ref >60)
Glucose, Bld: 81 mg/dL (ref 70–99)
Potassium: 4 mmol/L (ref 3.5–5.1)
Sodium: 140 mmol/L (ref 135–145)

## 2018-04-21 MED ORDER — AMLODIPINE BESYLATE 5 MG PO TABS: 5.0000 mg | ORAL_TABLET | Freq: Every day | ORAL | 0 refills | Status: DC

## 2018-04-21 NOTE — ED Provider Notes (Signed)
[name]   [id number] 04/21/18 Arrival Time: 1015  CC: High blood pressure  SUBJECTIVE:  Benjamin [name] is a 58 y.o. male hx significant for HLD, tobacco 1 PPD x 20 years, and PAD, who presents with elevated blood pressure. Denies hx of HTN.  Has not been on medication for HTN in the past.  States he checked blood pressure yesterday and it was 200 over something, and again and it was 140/106.  184/94 in office today.  Does not have a PCP.  Reports hx of mild HA, and an episode of dizziness yesterday, that has since resolved. Also mentions intermittent constipation.  Denies vision changes, lightheadedness, chest pain, shortness of breath, numbness or tingling in extremities, abdominal pain, changes in bladder habits.    ROS: As per HPI.  Past Medical History:  Diagnosis Date  . Back pain   . Heavy smoker   . Hyperlipidemia LDL goal <70   . 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   Bilateral femoral artery disease   Past Surgical History:  Procedure Laterality Date  . ABDOMINAL AORTOGRAM W/LOWER EXTREMITY N/A 07/12/2016   Procedure: Abdominal Aortogram w/Lower Extremity;  Surgeon: Nada Libman, MD;  Location: [facility] INVASIVE CV LAB;  Service: Cardiovascular;  Laterality: N/A;  Bilateral extermity: Patent Renal As. No sig Dz in infrarenal Abd Aorta. Normal Bilat Iliac arteries. R SFA is 100% @ origin - recon in [place]-Pop A. R PT A patent. L CFA occluded. L PFA recon @ origin, L SFA occluded w/ recon in [place] Pop A.  . COLONOSCOPY  06/2016   never  . NO PAST SURGERIES  06/2016   Allergies  Allergen Reactions  . Aspirin Nausea And Vomiting  . Garlic Other (See Comments)    sneezing   No current facility-administered medications on file prior to encounter.    Current Outpatient Medications on File Prior to Encounter  Medication Sig Dispense Refill  . acetaminophen (TYLENOL) 500 MG tablet Take 500 mg by mouth every 6 (six) hours as needed for mild pain or headache.     . cilostazol (PLETAL) 100 MG tablet Take 1 tablet (100 mg total) by mouth 2 (two) times daily before a meal. (Patient not taking: Reported on 04/21/2018) 60 tablet 11   Social History   Socioeconomic History  . Marital status: Single    Spouse name: Not on file  . Number of children: Not on file  . Years of education: 45  . Highest education level: Not on file  Occupational History  . Not on file  Social Needs  . Financial resource strain: Not on file  . Food insecurity:    Worry: Not on file    Inability: Not on file  . Transportation needs:    Medical: Not on file    Non-medical: Not on file  Tobacco Use  . Smoking status: Current Every Day Smoker    Packs/day: 1.50  . Smokeless tobacco: Never Used  . Tobacco comment: At least 20 years  Substance and Sexual Activity  . Alcohol use: No  . Drug use: No  . Sexual activity: Not Currently  Lifestyle  . Physical activity:    Days per week: Not on file    Minutes per session: Not on file  . Stress: Not on file  Relationships  . Social connections:    Talks on phone: Not on file    Gets together: Not on file    Attends religious service: Not on file  Active member of club or organization: Not on file    Attends meetings of clubs or organizations: Not on file    Relationship status: Not on file  . Intimate partner violence:    Fear of current or ex partner: Not on file    Emotionally abused: Not on file    Physically abused: Not on file    Forced sexual activity: Not on file  Other Topics Concern  . 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   He lives with his sister and her son.   Walks every day to work at least 15+ minutes.   Family History  Problem Relation Age of Onset  . Arthritis Mother   . COPD Father   . Stroke Father   . Multiple sclerosis Sister     OBJECTIVE:  Vitals:   04/21/18 1045  BP: (!) 184/94  Pulse: 73  Resp: 18  Temp: 98.2 F (36.8 C)    SpO2: 100%    General appearance: alert; no distress Eyes: PERRLA; EOMI HENT: normocephalic; atraumatic; edentulous  Neck: supple with FROM Lungs: distant breath sounds, CTAB Heart: regular rate and rhythm.  Radial pulses 2+ symmetrical bilaterally Extremities: no edema; symmetrical with no gross deformities Skin: warm and dry Neurologic: ambulates without difficulty Psychological: alert and cooperative; normal mood and affect  ASSESSMENT & PLAN:  1. Elevated blood pressure reading     Meds ordered this encounter  Medications  . amLODipine (NORVASC) 5 MG tablet    Sig: Take 1 tablet (5 mg total) by mouth daily.    Dispense:  30 tablet    Refill:  0    Order Specific Question:   Supervising Provider    Answer:   Eustace Moore [[id number]]    Please continue to monitor blood pressure at home and keep a log Eat a well balanced diet of fruits, vegetables and lean meats.  Avoid foods high in fat and salt.   Drink water.  At least half your body weight in ounces Exercise for at least 30 minutes daily Try to reduce the amount of cigarettes you are smoking Blood test ordered.  We will follow up with you regarding abnormal results.   Please return next Wednesday for a nurse's visit to have blood pressure checked.   Norvasc 5 mg prescribed.  Take as directed Follow up with Benjamin [name] to establish care and for further management and evaluation of elevated blood pressure.   Return or go to the ED if you have any new or worsening symptoms such as vision changes, fatigue, dizziness, chest pain, shortness of breath, nausea, swelling in your hands or feet, urinary symptoms, etc...  Reviewed expectations re: course of current medical issues. Questions answered. Outlined signs and symptoms indicating need for more acute intervention. Patient verbalized understanding. After Visit Summary given.   Benjamin Harding, PA-C 04/21/18 1200

## 2018-04-21 NOTE — Discharge Instructions (Signed)
Please continue to monitor blood pressure at home and keep a log Eat a well balanced diet of fruits, vegetables and lean meats.  Avoid foods high in fat and salt.   Drink water.  At least half your body weight in ounces Exercise for at least 30 minutes daily Try to reduce the amount of cigarettes you are smoking Blood test ordered.  We will follow up with you regarding abnormal results.   Please return next Wednesday for a nurse's visit to have blood pressure checked.   Norvasc 5 mg prescribed.  Take as directed Follow up with Benjamin Courts FNP to establish care and for further management and evaluation of elevated blood pressure.   Return or go to the ED if you have any new or worsening symptoms such as vision changes, fatigue, dizziness, chest pain, shortness of breath, nausea, swelling in your hands or feet, urinary symptoms, etc..Marland Kitchen

## 2018-04-21 NOTE — ED Triage Notes (Signed)
Pt states "I felt a little dizzy this mornign so I checked my blood pressure and it was 140/106" denies symptoms at this time.

## 2018-04-23 ENCOUNTER — Telehealth (HOSPITAL_COMMUNITY): Payer: Self-pay | Admitting: Emergency Medicine

## 2018-04-23 NOTE — Telephone Encounter (Signed)
Attempted to reach patient. No answer at this time. All numbers are wrong numbers.

## 2019-02-06 ENCOUNTER — Ambulatory Visit (HOSPITAL_COMMUNITY)
Admission: EM | Admit: 2019-02-06 | Discharge: 2019-02-06 | Disposition: A | Discharge status: Home / Self Care | Payer: Managed Care, Other (non HMO) | Attending: Family Medicine | Admitting: Family Medicine

## 2019-02-06 NOTE — ED Notes (Signed)
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

## 2019-05-15 ENCOUNTER — Other Ambulatory Visit: Payer: Self-pay

## 2019-05-15 ENCOUNTER — Encounter: Payer: Self-pay | Admitting: Nurse Practitioner

## 2019-05-15 ENCOUNTER — Ambulatory Visit: Payer: Managed Care, Other (non HMO) | Admitting: Nurse Practitioner

## 2019-05-15 VITALS — BP 130/86 | HR 79 | Temp 97.7°F | Ht 68.0 in | Wt 152.6 lb

## 2019-05-15 DIAGNOSIS — E785 Hyperlipidemia, unspecified: Secondary | ICD-10-CM

## 2019-05-15 DIAGNOSIS — Z1159 Encounter for screening for other viral diseases: Secondary | ICD-10-CM

## 2019-05-15 DIAGNOSIS — R634 Abnormal weight loss: Secondary | ICD-10-CM

## 2019-05-15 DIAGNOSIS — M79605 Pain in left leg: Secondary | ICD-10-CM | POA: Diagnosis not present

## 2019-05-15 DIAGNOSIS — I739 Peripheral vascular disease, unspecified: Secondary | ICD-10-CM

## 2019-05-15 MED ORDER — GABAPENTIN 100 MG PO CAPS: 100.0000 mg | ORAL_CAPSULE | Freq: Three times a day (TID) | ORAL | 2 refills | Status: DC

## 2019-05-15 NOTE — Progress Notes (Signed)
This visit occurred during the SARS-CoV-2 public health emergency.  Safety protocols were in place, including screening questions prior to the visit, additional usage of staff PPE, and extensive cleaning of exam room while observing appropriate contact time as indicated for disinfecting solutions.  Subjective:     Patient ID: Benjamin [name] , male    DOB: [birth year] , 59 y.o.   MRN: [id number]   Chief Complaint  Patient presents with  . Establish Care    (L) foot pain and leg pain     HPI  Here to establish care - Jomarie Longs his sister. He has not had a PCP in quite some time. He has not seen a vascular provider for at least 2 years.     Wound to left great toe and unintentional weight loss.  Takes care of his sister with MS.  Brother -  Single, no children. Works at [employer].   covid vaccine at [facility] due for second.   2 cigarettes day smoker.  He has seen Dr. Darnell Level - was on Platel.  PMH - hypertension.  Smoker - 1.5 PPD for 40 plus years.  Occasionally will have shortness of breath on exertion.    Wt Readings from Last 3 Encounters: 05/15/19 : 152 lb 9.6 oz (69.2 kg) 04/27/17 : 160 lb (72.6 kg) 01/20/17 : 161 lb 6.4 oz (73.2 kg)  Was weighing 180 lbs this time last year.  He has not been trying to lose weight.  He had been taking stackers since last year.  Also having fatigue.    No drug history.      Past Medical History:  Diagnosis Date  . Back pain   . Heavy smoker   . Hyperlipidemia LDL goal <70   . Peripheral arterial occlusive disease ([place]) 06/2013   Bilateral femoral artery disease     Family History  Problem Relation Age of Onset  . Arthritis Mother   . COPD Father   . Stroke Father   . Multiple sclerosis Sister      Current Outpatient Medications:  .  acetaminophen (TYLENOL) 500 MG tablet, Take 500 mg by mouth every 6 (six) hours as needed for mild pain or headache., Disp: , Rfl:  .  amLODipine (NORVASC) 5 MG tablet, Take 1  tablet (5 mg total) by mouth daily. (Patient not taking: Reported on 05/15/2019), Disp: 30 tablet, Rfl: 0 .  cilostazol (PLETAL) 100 MG tablet, Take 1 tablet (100 mg total) by mouth 2 (two) times daily before a meal. (Patient not taking: Reported on 04/21/2018), Disp: 60 tablet, Rfl: 11   Allergies  Allergen Reactions  . Aspirin Nausea And Vomiting  . Garlic Other (See Comments)    sneezing     Review of Systems  Constitutional: Negative.   Respiratory: Negative.   Cardiovascular: Negative.   Musculoskeletal:       Lower leg pain  Neurological: Negative for dizziness and headaches.  Psychiatric/Behavioral: Negative.      Today's Vitals   05/15/19 0939  BP: 130/86  Pulse: 79  Temp: 97.7 F (36.5 C)  TempSrc: Oral  SpO2: 99%  Weight: 152 lb 9.6 oz (69.2 kg)  Height: _0  (1.727 m)   Body mass index is 23.2 kg/m.   Objective:  Physical Exam Constitutional:      General: He is not in acute distress. Cardiovascular:     Rate and Rhythm: Normal rate and regular rhythm.     Pulses: Normal pulses.  Heart sounds: Normal heart sounds. No murmur.  Musculoskeletal:        General: Tenderness (lower extremities, he has minimal hair and shiny skin to his lower extremities and his dorsalis pedis pulses are diminished) present. No swelling.     Right lower leg: Edema (trace) present.     Left lower leg: Edema (trace) present.  Skin:    General: Skin is warm and dry.     Capillary Refill: Capillary refill takes less than 2 seconds.  Neurological:     General: No focal deficit present.     Mental Status: He is alert and oriented to person, place, and time.  Psychiatric:        Mood and Affect: Mood normal.        Behavior: Behavior normal.        Thought Content: Thought content normal.        Judgment: Judgment normal.         Assessment And Plan:     1. Peripheral vascular disease ([place])  Will restart him on gabapentin for the pain - gabapentin (NEURONTIN) 100 MG  capsule; Take 1 capsule (100 mg total) by mouth 3 (three) times daily.  Dispense: 90 capsule; Refill: 2 - VITAMIN D 25 Hydroxy (Vit-D Deficiency, Fractures)  2. Intermittent claudication ([facility])  Likely related to his PAD  EKG done with HR 63, left axis anterior fascicular block - EKG 12-Lead  3. Encounter for hepatitis C screening test for low risk patient  Will check for Hepatitis C screening due to being born between the years 63-1966 - Hepatitis C antibody  4. Abnormal weight loss  Will refer to GI for further evaluation he has not had a colonoscopy in quite some time and he reports weight 180 lbs this time last year - Ambulatory referral to Gastroenterology - TSH - Hemoglobin A1c - HIV antibody (with reflex) - VITAMIN D 25 Hydroxy (Vit-D Deficiency, Fractures)  5. Dyslipidemia, goal LDL below 70  Chronic, will check lipid panel - Lipid panel - CMP14+EGFR  6. Left leg pain  Likely related to his PAD - CBC   [name], FNP    THE PATIENT IS ENCOURAGED TO PRACTICE SOCIAL DISTANCING DUE TO THE COVID-19 PANDEMIC.

## 2019-05-16 LAB — CMP14+EGFR
ALT: 14 IU/L (ref 0–44)
AST: 21 IU/L (ref 0–40)
Albumin/Globulin Ratio: 1.7 (ref 1.2–2.2)
Albumin: 4.1 g/dL (ref 3.8–4.9)
Alkaline Phosphatase: 114 IU/L (ref 39–117)
BUN/Creatinine Ratio: 9 — ABNORMAL LOW (ref 10–24)
BUN: 10 mg/dL (ref 8–27)
Bilirubin Total: 0.3 mg/dL (ref 0.0–1.2)
CO2: 25 mmol/L (ref 20–29)
Calcium: 9.1 mg/dL (ref 8.6–10.2)
Chloride: 104 mmol/L (ref 96–106)
Creatinine, Ser: 1.08 mg/dL (ref 0.76–1.27)
GFR calc Af Amer: 86 mL/min/{1.73_m2} (ref >59)
GFR calc non Af Amer: 74 mL/min/{1.73_m2} (ref >59)
Globulin, Total: 2.4 g/dL (ref 1.5–4.5)
Glucose: 85 mg/dL (ref 65–99)
Potassium: 4 mmol/L (ref 3.5–5.2)
Sodium: 140 mmol/L (ref 134–144)
Total Protein: 6.5 g/dL (ref 6.0–8.5)

## 2019-05-16 LAB — CBC
Hematocrit: 41.4 % (ref 37.5–51.0)
Hemoglobin: 14.9 g/dL (ref 13.0–17.7)
MCH: 30.8 pg (ref 26.6–33.0)
MCHC: 36 g/dL — ABNORMAL HIGH (ref 31.5–35.7)
MCV: 86 fL (ref 79–97)
Platelets: 262 10*3/uL (ref 150–450)
RBC: 4.84 x10E6/uL (ref 4.14–5.80)
RDW: 13.9 % (ref 11.6–15.4)
WBC: 6.2 10*3/uL (ref 3.4–10.8)

## 2019-05-16 LAB — TSH: TSH: 1.13 u[IU]/mL (ref 0.450–4.500)

## 2019-05-16 LAB — LIPID PANEL
Chol/HDL Ratio: 3.3 ratio (ref 0.0–5.0)
Cholesterol, Total: 203 mg/dL — ABNORMAL HIGH (ref 100–199)
HDL: 61 mg/dL (ref >39)
LDL Chol Calc (NIH): 123 mg/dL — ABNORMAL HIGH (ref 0–99)
Triglycerides: 108 mg/dL (ref 0–149)
VLDL Cholesterol Cal: 19 mg/dL (ref 5–40)

## 2019-05-16 LAB — HIV ANTIBODY (ROUTINE TESTING W REFLEX): HIV Screen 4th Generation wRfx: NONREACTIVE

## 2019-05-16 LAB — HEPATITIS C ANTIBODY: Hep C Virus Ab: <0.1 s/co ratio (ref 0.0–0.9)

## 2019-05-16 LAB — HEMOGLOBIN A1C
Est. average glucose Bld gHb Est-mCnc: 114 mg/dL
Hgb A1c MFr Bld: 5.6 % (ref 4.8–5.6)

## 2019-05-16 LAB — VITAMIN D 25 HYDROXY (VIT D DEFICIENCY, FRACTURES): Vit D, 25-Hydroxy: 10.9 ng/mL — ABNORMAL LOW (ref 30.0–100.0)

## 2019-05-28 MED ORDER — VITAMIN D (ERGOCALCIFEROL) 1.25 MG (50000 UNIT) PO CAPS: 50000.0000 [IU] | ORAL_CAPSULE | ORAL | 1 refills | Status: DC

## 2019-06-26 ENCOUNTER — Ambulatory Visit: Payer: Managed Care, Other (non HMO) | Admitting: Nurse Practitioner

## 2019-07-01 ENCOUNTER — Other Ambulatory Visit: Payer: Self-pay

## 2019-07-01 ENCOUNTER — Ambulatory Visit: Payer: Managed Care, Other (non HMO) | Admitting: Nurse Practitioner

## 2019-07-01 ENCOUNTER — Encounter: Payer: Self-pay | Admitting: Nurse Practitioner

## 2019-07-01 VITALS — BP 122/80 | HR 92 | Temp 98.7°F | Ht 67.0 in | Wt 153.2 lb

## 2019-07-01 DIAGNOSIS — Z720 Tobacco use: Secondary | ICD-10-CM | POA: Diagnosis not present

## 2019-07-01 DIAGNOSIS — L84 Corns and callosities: Secondary | ICD-10-CM | POA: Diagnosis not present

## 2019-07-01 DIAGNOSIS — I739 Peripheral vascular disease, unspecified: Secondary | ICD-10-CM | POA: Diagnosis not present

## 2019-07-01 MED ORDER — GABAPENTIN 100 MG PO CAPS: 100.0000 mg | ORAL_CAPSULE | Freq: Three times a day (TID) | ORAL | 2 refills | Status: DC

## 2019-07-01 MED ORDER — MAGNESIUM 200 MG PO TABS: ORAL_TABLET | ORAL | 2 refills | Status: DC

## 2019-07-01 MED ORDER — CHANTIX STARTING MONTH PAK 0.5 MG X 11 & 1 MG X 42 PO TABS: ORAL_TABLET | ORAL | 0 refills | Status: DC

## 2019-07-01 NOTE — Progress Notes (Signed)
This visit occurred during the SARS-CoV-2 public health emergency.  Safety protocols were in place, including screening questions prior to the visit, additional usage of staff PPE, and extensive cleaning of exam room while observing appropriate contact time as indicated for disinfecting solutions.  Subjective:     Patient ID: Benjamin [name] , male    DOB: Feb 16, 1961 , 59 y.o.   MRN: [id number]   Chief Complaint  Patient presents with  . Foot Swelling    Left Foot    HPI  His foot pain has improved but is worse at night. Especially after working standing for long hours.  He is taking gabapentin which has helped.  Reports having a callous to his left foot. Smokes cigarettes since age of 71 of 1 PPD.      Past Medical History:  Diagnosis Date  . Back pain   . Heavy smoker   . Hyperlipidemia LDL goal <70   . 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   Bilateral femoral artery disease     Family History  Problem Relation Age of Onset  . Arthritis Mother   . COPD Father   . Stroke Father   . Multiple sclerosis Sister      Current Outpatient Medications:  .  acetaminophen (TYLENOL) 500 MG tablet, Take 500 mg by mouth every 6 (six) hours as needed for mild pain or headache., Disp: , Rfl:  .  amLODipine (NORVASC) 5 MG tablet, Take 1 tablet (5 mg total) by mouth daily., Disp: 30 tablet, Rfl: 0 .  cilostazol (PLETAL) 100 MG tablet, Take 1 tablet (100 mg total) by mouth 2 (two) times daily before a meal., Disp: 60 tablet, Rfl: 11 .  gabapentin (NEURONTIN) 100 MG capsule, Take 1 capsule (100 mg total) by mouth 3 (three) times daily., Disp: 90 capsule, Rfl: 2 .  Vitamin D, Ergocalciferol, (DRISDOL) 1.25 MG (50000 UNIT) CAPS capsule, Take 1 capsule (50,000 Units total) by mouth 2 (two) times a week., Disp: 24 capsule, Rfl: 1   Allergies  Allergen Reactions  . Aspirin Nausea And Vomiting  . Garlic Other (See Comments)    sneezing     Review of Systems  Constitutional:  Negative.   Respiratory: Negative.   Cardiovascular: Negative.  Negative for chest pain, palpitations and leg swelling.  Neurological: Negative for dizziness and headaches.  Psychiatric/Behavioral: Negative.      Today's Vitals   07/01/19 0907  BP: 122/80  Pulse: 92  Temp: 98.7 F (37.1 C)  Weight: 153 lb 3.2 oz (69.5 kg)  Height: 5\' 7"  (1.702 m)   Body mass index is 23.99 kg/m.   Objective:  Physical Exam Vitals reviewed.  Constitutional:      General: He is not in acute distress.    Appearance: Normal appearance.  Cardiovascular:     Rate and Rhythm: Normal rate and regular rhythm.     Pulses: Normal pulses.     Heart sounds: Normal heart sounds. No murmur.  Skin:    Capillary Refill: Capillary refill takes less than 2 seconds.     Comments: Left foot with callous formation to the heel  Neurological:     General: No focal deficit present.     Mental Status: He is alert and oriented to person, place, and time.     Cranial Nerves: No cranial nerve deficit.  Psychiatric:        Mood and Affect: Mood normal.        Behavior: Behavior normal.  Thought Content: Thought content normal.        Judgment: Judgment normal.         Assessment And Plan:  1. Peripheral vascular disease ([place])  Has improved since his last visit but still having some pain at night after working.    Will refer him to Dr. Trula Slade who it appears he has seen in the past.  - Ambulatory referral to Vascular Surgery - Magnesium 200 MG TABS; Take 1 tablet by mouth daily with evening meal  Dispense: 30 tablet; Refill: 2 - gabapentin (NEURONTIN) 100 MG capsule; Take 1 capsule (100 mg total) by mouth 3 (three) times daily.  Dispense: 90 capsule; Refill: 2  2. Callus of foot  Left foot with thickened skin to the bottom of foot near toes.   Will refer to podiatrist and he has thickened toenails with yellow discoloration to toenails. - Ambulatory referral to Podiatry  3. Tobacco abuse  40 +  year history of smoking one PPD  Will start chantix as his smoking can be affecting his peripheral vascular disease.  - varenicline (CHANTIX STARTING MONTH PAK) 0.5 MG X 11 & 1 MG X 42 tablet; Take one 0.5 mg tablet by mouth once daily for 3 days, then increase to one 0.5 mg tablet twice daily for 4 days, then increase to one 1 mg tablet twice daily.  Dispense: 53 tablet; Refill: 0     [name], FNP    THE PATIENT IS ENCOURAGED TO PRACTICE SOCIAL DISTANCING DUE TO THE COVID-19 PANDEMIC.

## 2019-07-04 ENCOUNTER — Ambulatory Visit (INDEPENDENT_AMBULATORY_CARE_PROVIDER_SITE_OTHER): Payer: Managed Care, Other (non HMO) | Admitting: Podiatry

## 2019-07-04 ENCOUNTER — Other Ambulatory Visit: Payer: Self-pay

## 2019-07-04 ENCOUNTER — Encounter: Payer: Self-pay | Admitting: Podiatry

## 2019-07-04 VITALS — BP 174/104 | HR 82 | Temp 98.4°F

## 2019-07-04 DIAGNOSIS — M79674 Pain in right toe(s): Secondary | ICD-10-CM | POA: Diagnosis not present

## 2019-07-04 DIAGNOSIS — M722 Plantar fascial fibromatosis: Secondary | ICD-10-CM | POA: Diagnosis not present

## 2019-07-04 DIAGNOSIS — L84 Corns and callosities: Secondary | ICD-10-CM

## 2019-07-04 DIAGNOSIS — M79675 Pain in left toe(s): Secondary | ICD-10-CM

## 2019-07-04 DIAGNOSIS — B351 Tinea unguium: Secondary | ICD-10-CM

## 2019-07-04 DIAGNOSIS — I739 Peripheral vascular disease, unspecified: Secondary | ICD-10-CM

## 2019-07-04 DIAGNOSIS — M2012 Hallux valgus (acquired), left foot: Secondary | ICD-10-CM

## 2019-07-04 DIAGNOSIS — M2011 Hallux valgus (acquired), right foot: Secondary | ICD-10-CM

## 2019-07-04 NOTE — Patient Instructions (Addendum)
[facility] [employer] [facility]  Plantar Fasciitis (Heel Spur Syndrome) with Rehab The plantar fascia is a fibrous, ligament-like, soft-tissue structure that spans the bottom of the foot. Plantar fasciitis is a condition that causes pain in the foot due to inflammation of the tissue. SYMPTOMS   Pain and tenderness on the underneath side of the foot.  Pain that worsens with standing or walking. CAUSES  Plantar fasciitis is caused by irritation and injury to the plantar fascia on the underneath side of the foot. Common mechanisms of injury include:  Direct trauma to bottom of the foot.  Damage to a small nerve that runs under the foot where the main fascia attaches to the heel bone.  Stress placed on the plantar fascia due to bone spurs. RISK INCREASES WITH:   Activities that place stress on the plantar fascia (running, jumping, pivoting, or cutting).  Poor strength and flexibility.  Improperly fitted shoes.  Tight calf muscles.  Flat feet.  Failure to warm-up properly before activity.  Obesity. PREVENTION  Warm up and stretch properly before activity.  Allow for adequate recovery between workouts.  Maintain physical fitness:  Strength, flexibility, and endurance.  Cardiovascular fitness.  Maintain a health body weight.  Avoid stress on the plantar fascia.  Wear properly fitted shoes, including arch supports for individuals who have flat feet.  PROGNOSIS  If treated properly, then the symptoms of plantar fasciitis usually resolve without surgery. However, occasionally surgery is necessary.  RELATED COMPLICATIONS   Recurrent symptoms that may result in a chronic condition.  Problems of the lower back that are caused by compensating for the injury, such as limping.  Pain or weakness of the foot during push-off following surgery.  Chronic inflammation, scarring, and partial or complete fascia tear, occurring more often from repeated  injections.  TREATMENT  Treatment initially involves the use of ice and medication to help reduce pain and inflammation. The use of strengthening and stretching exercises may help reduce pain with activity, especially stretches of the Achilles tendon. These exercises may be performed at home or with a therapist. Your caregiver may recommend that you use heel cups of arch supports to help reduce stress on the plantar fascia. Occasionally, corticosteroid injections are given to reduce inflammation. If symptoms persist for greater than 6 months despite non-surgical (conservative), then surgery may be recommended.   MEDICATION   If pain medication is necessary, then nonsteroidal anti-inflammatory medications, such as aspirin and ibuprofen, or other minor pain relievers, such as acetaminophen, are often recommended.  Do not take pain medication within 7 days before surgery.  Prescription pain relievers may be given if deemed necessary by your caregiver. Use only as directed and only as much as you need.  Corticosteroid injections may be given by your caregiver. These injections should be reserved for the most serious cases, because they may only be given a certain number of times.  HEAT AND COLD  Cold treatment (icing) relieves pain and reduces inflammation. Cold treatment should be applied for 10 to 15 minutes every 2 to 3 hours for inflammation and pain and immediately after any activity that aggravates your symptoms. Use ice packs or massage the area with a piece of ice (ice massage).  Heat treatment may be used prior to performing the stretching and strengthening activities prescribed by your caregiver, physical therapist, or athletic trainer. Use a heat pack or soak the injury in warm water.  SEEK IMMEDIATE MEDICAL CARE IF:  Treatment seems to  offer no benefit, or the condition worsens.  Any medications produce adverse side effects.  EXERCISES- RANGE OF MOTION (ROM) AND STRETCHING  EXERCISES - Plantar Fasciitis (Heel Spur Syndrome) These exercises may help you when beginning to rehabilitate your injury. Your symptoms may resolve with or without further involvement from your physician, physical therapist or athletic trainer. While completing these exercises, remember:   Restoring tissue flexibility helps normal motion to return to the joints. This allows healthier, less painful movement and activity.  An effective stretch should be held for at least 30 seconds.  A stretch should never be painful. You should only feel a gentle lengthening or release in the stretched tissue.  RANGE OF MOTION - Toe Extension, Flexion  Sit with your right / left leg crossed over your opposite knee.  Grasp your toes and gently pull them back toward the top of your foot. You should feel a stretch on the bottom of your toes and/or foot.  Hold this stretch for 10 seconds.  Now, gently pull your toes toward the bottom of your foot. You should feel a stretch on the top of your toes and or foot.  Hold this stretch for 10 seconds. Repeat  times. Complete this stretch 3 times per day.   RANGE OF MOTION - Ankle Dorsiflexion, Active Assisted  Remove shoes and sit on a chair that is preferably not on a carpeted surface.  Place right / left foot under knee. Extend your opposite leg for support.  Keeping your heel down, slide your right / left foot back toward the chair until you feel a stretch at your ankle or calf. If you do not feel a stretch, slide your bottom forward to the edge of the chair, while still keeping your heel down.  Hold this stretch for 10 seconds. Repeat 3 times. Complete this stretch 2 times per day.   STRETCH  Gastroc, Standing  Place hands on wall.  Extend right / left leg, keeping the front knee somewhat bent.  Slightly point your toes inward on your back foot.  Keeping your right / left heel on the floor and your knee straight, shift your weight toward the wall,  not allowing your back to arch.  You should feel a gentle stretch in the right / left calf. Hold this position for 10 seconds. Repeat 3 times. Complete this stretch 2 times per day.  STRETCH  Soleus, Standing  Place hands on wall.  Extend right / left leg, keeping the other knee somewhat bent.  Slightly point your toes inward on your back foot.  Keep your right / left heel on the floor, bend your back knee, and slightly shift your weight over the back leg so that you feel a gentle stretch deep in your back calf.  Hold this position for 10 seconds. Repeat 3 times. Complete this stretch 2 times per day.  STRETCH  Gastrocsoleus, Standing  Note: This exercise can place a lot of stress on your foot and ankle. Please complete this exercise only if specifically instructed by your caregiver.   Place the ball of your right / left foot on a step, keeping your other foot firmly on the same step.  Hold on to the wall or a rail for balance.  Slowly lift your other foot, allowing your body weight to press your heel down over the edge of the step.  You should feel a stretch in your right / left calf.  Hold this position for 10 seconds.  Repeat  this exercise with a slight bend in your right / left knee. Repeat 3 times. Complete this stretch 2 times per day.   STRENGTHENING EXERCISES - Plantar Fasciitis (Heel Spur Syndrome)  These exercises may help you when beginning to rehabilitate your injury. They may resolve your symptoms with or without further involvement from your physician, physical therapist or athletic trainer. While completing these exercises, remember:   Muscles can gain both the endurance and the strength needed for everyday activities through controlled exercises.  Complete these exercises as instructed by your physician, physical therapist or athletic trainer. Progress the resistance and repetitions only as guided.  STRENGTH - Towel Curls  Sit in a chair positioned on a  non-carpeted surface.  Place your foot on a towel, keeping your heel on the floor.  Pull the towel toward your heel by only curling your toes. Keep your heel on the floor. Repeat 3 times. Complete this exercise 2 times per day.  STRENGTH - Ankle Inversion  Secure one end of a rubber exercise band/tubing to a fixed object (table, pole). Loop the other end around your foot just before your toes.  Place your fists between your knees. This will focus your strengthening at your ankle.  Slowly, pull your big toe up and in, making sure the band/tubing is positioned to resist the entire motion.  Hold this position for 10 seconds.  Have your muscles resist the band/tubing as it slowly pulls your foot back to the starting position. Repeat 3 times. Complete this exercises 2 times per day.  Document Released: 02/07/2005 Document Revised: 05/02/2011 Document Reviewed: 05/22/2008 [facility] Patient Information 2014 [place], [place].  Peripheral Vascular Disease Peripheral vascular disease (PVD) is a disease of the blood vessels. A simple term for PVD is poor circulation. In most cases, PVD narrows the blood vessels that carry blood from your heart to the rest of your body. This can result in a decreased supply of blood to your arms, legs, and internal organs, like your stomach or kidneys. However, it most often affects a person's lower legs and feet. There are two types of PVD.  Organic PVD. This is the more common type. It is caused by damage to the structure of blood vessels.  Functional PVD. This is caused by conditions that make blood vessels contract and tighten (spasm). Without treatment, PVD tends to get worse over time. PVD can also lead to acute limb ischemia. This is when an arm or leg suddenly has trouble getting enough blood. This is a medical emergency. What are the causes?  Each type of PVD has many different causes. The most common cause of PVD is buildup of a fatty material (plaque)  inside your arteries (atherosclerosis). Small amounts of plaque can break off from the walls of the blood vessels and become lodged in a smaller artery. This blocks blood flow and can cause acute limb ischemia. Other common causes of PVD include:  Blood clots that form inside of blood vessels.  Injuries to blood vessels.  Diseases that cause inflammation of blood vessels or cause blood vessel spasms.  Health behaviors and health history that increase your risk of developing PVD. What increases the risk? You are more likely to develop this condition if:  You have a family history of PVD.  You have certain medical conditions, including: ? High cholesterol. ? Diabetes. ? High blood pressure (hypertension). ? Coronary heart disease. ? Past problems with blood clots. ? Past injury, such as burns or a broken bone. These  may have damaged blood vessels in your limbs. ? Buerger disease. This is caused by inflamed blood vessels in your hands and feet. ? Some forms of arthritis. ? Rare birth defects that affect the arteries in your legs. ? Kidney disease.  You use tobacco or smoke.  You do not get enough exercise.  You are obese.  You are age 60 or older. What are the signs or symptoms? This condition may cause different symptoms. Your symptoms depend on what part of your body is not getting enough blood. Some common signs and symptoms include:  Cramps in your lower legs. This may be a symptom of poor leg circulation (claudication).  Pain and weakness in your legs. This happens while you are physically active but goes away when you rest (intermittent claudication).  Leg pain when at rest.  Leg numbness, tingling, or weakness.  Coldness in a leg or foot, especially when compared with the other leg.  Skin or hair changes. These can include: ? Hair loss. ? Shiny skin. ? Pale or bluish skin. ? Thick toenails.  Inability to get or maintain an erection (erectile  dysfunction).  Fatigue. People with PVD are more likely to develop ulcers and sores on their toes, feet, or legs. These may take longer than normal to heal. How is this diagnosed? This condition is diagnosed based on:  Your signs and symptoms.  A physical exam and your medical history.  Other tests to find out what is causing your PVD and to determine its severity. Tests may include: ? Blood pressure recordings from your arms and legs and measurements of the strength of your pulses (pulse volume recordings). ? Imaging studies using sound waves to take pictures of the blood flow through your blood vessels (Doppler ultrasound). ? Injecting a dye into your blood vessels before having imaging studies using:  X-rays (angiogram or arteriogram).  Computer-generated X-rays (CT angiogram).  A powerful electromagnetic field and a computer (magnetic resonance angiogram or MRA). How is this treated? Treatment for PVD depends on the cause of your condition and how severe your symptoms are. It also depends on your age. Underlying causes need to be treated and controlled. These include long-term (chronic) conditions, such as diabetes, high cholesterol, and high blood pressure. Treatment includes:  Lifestyle changes, such as: ? Quitting smoking. ? Exercising regularly. ? Following a low-fat, low-cholesterol diet.  Taking medicines, such as: ? Blood thinners to prevent blood clots. ? Medicines to improve blood flow. ? Medicines to improve your blood cholesterol levels.  Surgical procedures, such as: ? A procedure that uses an inflated balloon to open a blocked artery and improve blood flow (angioplasty). ? A procedure to put in a wire mesh tube to keep a blocked artery open (stent implant). ? Surgery to reroute blood flow around a blocked artery (peripheral bypass surgery). ? Surgery to remove dead tissue from an infected wound on the affected limb. ? Amputation. This is surgical removal of the  affected limb. It may be necessary in cases of acute limb ischemia where there has been no improvement through medical or surgical treatments. Follow these instructions at home: Lifestyle  Do not use any products that contain nicotine or tobacco, such as cigarettes and e-cigarettes. If you need help quitting, ask your health care provider.  Lose weight if you are overweight, and maintain a healthy weight as discussed by your health care provider.  Eat a diet that is low in fat and cholesterol. If you need help, ask your  health care provider.  Exercise regularly. Ask your health care provider to suggest some good activities for you. General instructions  Take over-the-counter and prescription medicines only as told by your health care provider.  Take good care of your feet: ? Wear comfortable shoes that fit well. ? Check your feet often for any cuts or sores.  Keep all follow-up visits as told by your health care provider. This is important. Contact a health care provider if:  You have cramps in your legs while walking.  You have leg pain when you are at rest.  You have coldness in a leg or foot.  Your skin changes.  You have erectile dysfunction.  You have cuts or sores on your feet that are not healing. Get help right away if:  Your arm or leg turns cold, numb, and blue.  Your arms or legs become red, warm, swollen, painful, or numb.  You have chest pain or trouble breathing.  You suddenly have weakness in your face, arm, or leg.  You become very confused or lose the ability to speak.  You suddenly have a very bad headache or lose your vision. Summary  Peripheral vascular disease (PVD) is a disease of the blood vessels.  In most cases, PVD narrows the blood vessels that carry blood from your heart to the rest of your body.  PVD may cause different symptoms. Your symptoms depend on what part of your body is not getting enough blood.  Treatment for PVD depends on  the cause of your condition and how severe your symptoms are. This information is not intended to replace advice given to you by your health care provider. Make sure you discuss any questions you have with your health care provider. Document Revised: 01/20/2017 Document Reviewed: 03/17/2016 Elsevier Patient Education  2020 [employer]  Corns and Calluses Corns are small areas of thickened skin that occur on the top, sides, or tip of a toe. They contain a cone-shaped core with a point that can press on a nerve below. This causes pain.  Calluses are areas of thickened skin that can occur anywhere on the body, including the hands, fingers, palms, soles of the feet, and heels. Calluses are usually larger than corns. What are the causes? Corns and calluses are caused by rubbing (friction) or pressure, such as from shoes that are too tight or do not fit properly. What increases the risk? Corns are more likely to develop in people who have misshapen toes (toe deformities), such as hammer toes. Calluses can occur with friction to any area of the skin. They are more likely to develop in people who:  Work with their hands.  Wear shoes that fit poorly, are too tight, or are high-heeled.  Have toe deformities. What are the signs or symptoms? Symptoms of a corn or callus include:  A hard growth on the skin.  Pain or tenderness under the skin.  Redness and swelling.  Increased discomfort while wearing tight-fitting shoes, if your feet are affected. If a corn or callus becomes infected, symptoms may include:  Redness and swelling that gets worse.  Pain.  Fluid, blood, or pus draining from the corn or callus. How is this diagnosed? Corns and calluses may be diagnosed based on your symptoms, your medical history, and a physical exam. How is this treated? Treatment for corns and calluses may include:  Removing the cause of the friction or pressure. This may involve: ? Changing your  shoes. ? Wearing shoe inserts (orthotics)  or other protective layers in your shoes, such as a corn pad. ? Wearing gloves.  Applying medicine to the skin (topical medicine) to help soften skin in the hardened, thickened areas.  Removing layers of dead skin with a file to reduce the size of the corn or callus.  Removing the corn or callus with a scalpel or laser.  Taking antibiotic medicines, if your corn or callus is infected.  Having surgery, if a toe deformity is the cause. Follow these instructions at home:   Take over-the-counter and prescription medicines only as told by your health care provider.  If you were prescribed an antibiotic, take it as told by your health care provider. Do not stop taking it even if your condition starts to improve.  Wear shoes that fit well. Avoid wearing high-heeled shoes and shoes that are too tight or too loose.  Wear any padding, protective layers, gloves, or orthotics as told by your health care provider.  Soak your hands or feet and then use a file or pumice stone to soften your corn or callus. Do this as told by your health care provider.  Check your corn or callus every day for symptoms of infection. Contact a health care provider if you:  Notice that your symptoms do not improve with treatment.  Have redness or swelling that gets worse.  Notice that your corn or callus becomes painful.  Have fluid, blood, or pus coming from your corn or callus.  Have new symptoms. Summary  Corns are small areas of thickened skin that occur on the top, sides, or tip of a toe.  Calluses are areas of thickened skin that can occur anywhere on the body, including the hands, fingers, palms, and soles of the feet. Calluses are usually larger than corns.  Corns and calluses are caused by rubbing (friction) or pressure, such as from shoes that are too tight or do not fit properly.  Treatment may include wearing any padding, protective layers, gloves, or  orthotics as told by your health care provider. This information is not intended to replace advice given to you by your health care provider. Make sure you discuss any questions you have with your health care provider. Document Revised: 05/30/2018 Document Reviewed: 12/21/2016 Elsevier Patient Education  2020 [employer].

## 2019-07-12 NOTE — Progress Notes (Signed)
Subjective: Benjamin [name] presents today referred by Arnette Felts, FNP for complaint of painful callus(es) left foot. Aggravating factors include weightbearing with and without shoe gear. Pain is relieved with periodic professional debridement.   He states he has left foot pain, described as cramping in let calf after working on his feet after a period of 4 hours. Denis any symptoms of rest pain.   He works at [employer] for the past 15 years, mostly assisting with dining services.   Past Medical History:  Diagnosis Date  . Back pain   . Heavy smoker   . Hyperlipidemia LDL goal <70   . 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   Bilateral femoral artery disease     Patient Active Problem List   Diagnosis Date Noted  . Callus of foot 07/01/2019  . Intermittent claudication (HCC) 01/20/2017  . Need for influenza vaccination 01/20/2017  . Needs smoking cessation education 09/06/2016  . Abnormal echocardiogram 09/05/2016  . Abnormal EKG 07/25/2016  . Preoperative cardiovascular examination 07/25/2016  . Shortness of breath on exertion 07/25/2016  . Dyslipidemia, goal LDL below 70 07/25/2016  . High blood pressure 07/25/2016  . Abnormal chest x-ray 07/04/2016  . Peripheral vascular disease (HCC) 07/04/2016  . Decreased pedal pulses 06/24/2016  . Varicose vein of leg 06/24/2016  . Heavy smoker 06/24/2016  . Abnormal weight loss 06/24/2016  . Foot pain, left 06/24/2016  . Screening for prostate cancer 06/24/2016     Past Surgical History:  Procedure Laterality Date  . ABDOMINAL AORTOGRAM W/LOWER EXTREMITY N/A 07/12/2016   Procedure: Abdominal Aortogram w/Lower Extremity;  Surgeon: Nada Libman, MD;  [facility]: [facility];  Service: Cardiovascular;  Laterality: N/A;  Bilateral extermity: Patent Renal As. No sig Dz in infrarenal Abd Aorta. Normal Bilat Iliac arteries. R SFA is 100% @ origin - recon in [place]-Pop A. R PT A patent. L CFA occluded. L PFA  recon @ origin, L SFA occluded w/ recon in [place] Pop A.  . COLONOSCOPY  06/2016   never  . NO PAST SURGERIES  06/2016     Current Outpatient Medications on File Prior to Visit  Medication Sig Dispense Refill  . acetaminophen (TYLENOL) 500 MG tablet Take 500 mg by mouth every 6 (six) hours as needed for mild pain or headache.    Marland Kitchen amLODipine (NORVASC) 5 MG tablet Take 1 tablet (5 mg total) by mouth daily. 30 tablet 0  . cilostazol (PLETAL) 100 MG tablet Take 1 tablet (100 mg total) by mouth 2 (two) times daily before a meal. 60 tablet 11  . gabapentin (NEURONTIN) 100 MG capsule Take 1 capsule (100 mg total) by mouth 3 (three) times daily. 90 capsule 2  . Magnesium 200 MG TABS Take 1 tablet by mouth daily with evening meal 30 tablet 2  . varenicline (CHANTIX STARTING MONTH PAK) 0.5 MG X 11 & 1 MG X 42 tablet Take one 0.5 mg tablet by mouth once daily for 3 days, then increase to one 0.5 mg tablet twice daily for 4 days, then increase to one 1 mg tablet twice daily. 53 tablet 0  . Vitamin D, Ergocalciferol, (DRISDOL) 1.25 MG (50000 UNIT) CAPS capsule Take 1 capsule (50,000 Units total) by mouth 2 (two) times a week. 24 capsule 1   No current facility-administered medications on file prior to visit.     Allergies  Allergen Reactions  . Aspirin Nausea And Vomiting  . Garlic Other (See Comments)  sneezing     Social History   Occupational History  . Not on file  Tobacco Use  . Smoking status: Current Every Day Smoker    Packs/day: 1.50  . Smokeless tobacco: Never Used  . Tobacco comment: At least 20 years  Substance and Sexual Activity  . Alcohol use: No  . Drug use: No  . Sexual activity: Not Currently     Family History  Problem Relation Age of Onset  . Arthritis Mother   . COPD Father   . Stroke Father   . Multiple sclerosis Sister      Immunization History  Administered Date(s) Administered  . Influenza,inj,Quad PF,6+ Mos 01/20/2017     Objective: Vitals:    07/04/19 0825  BP: (!) 174/104  Pulse: 82  Temp: 98.4 F (36.9 C)    Vascular Examination:  Capillary refill time to digits immediate b/l. Palpable PT pulses b/l. DP pulse nonpalpable left foot. DP pulse palpable right foot. Pedal hair sparse b/l. Skin temperature gradient within normal limits b/l.  Dermatological Examination: Pedal skin with normal turgor, texture and tone bilaterally. No open wounds bilaterally. No interdigital macerations bilaterally. Toenails 1-5 b/l elongated, dystrophic, thickened, crumbly with subungual debris and tenderness to dorsal palpation. Hyperkeratotic lesion(s) L hallux, L 5th toe and plantar aspect left heel.  No erythema, no edema, no drainage, no flocculence.  Musculoskeletal: Normal muscle strength 5/5 to all lower extremity muscle groups bilaterally. No pain crepitus or joint limitation noted with ROM b/l. Hallux valgus with bunion deformity noted b/l. Pes planus deformity noted b/l.  Pain noted on palpation medial tubercle left heel.  Neurological: Protective sensation intact 5/5 intact bilaterally with 10g monofilament b/l. Vibratory sensation intact b/l. Proprioception intact bilaterally.  Assessment: 1. Pain due to onychomycosis of toenails of both feet   2. Callus of foot   3. Plantar fasciitis, left   4. Hallux valgus, acquired, bilateral   5. Intermittent claudication ([place])   6. Peripheral vascular disease ([place])    Plan: -Examined patient. -Toenails 1-5 b/l were debrided in length and girth with sterile nail nippers and dremel without iatrogenic bleeding.  -Calluses pared left plantar heel, left hallux and left 5th digit utilizing sterile scalpel blade without incident. -Patient to continue soft, supportive shoe gear daily. -Patient to report any pedal injuries to medical professional immediately. -Discussed plantar fasciitis left foot. Discussed treatment options. He declines injection. Instructions dispensed for stretching exercises.  Discussed need for orthotics for foot pain, Patient would like to try OTC product first. Recommended Spenco Arch Supports he can purchase at [place] or [place]. -Patient/POA to call should there be question/concern in the interim.  Return in about 10 weeks (around 09/12/2019) for nail and callus trim.

## 2019-08-16 ENCOUNTER — Other Ambulatory Visit: Payer: Self-pay | Admitting: Nurse Practitioner

## 2019-08-16 DIAGNOSIS — I739 Peripheral vascular disease, unspecified: Secondary | ICD-10-CM

## 2019-08-19 ENCOUNTER — Telehealth: Payer: Self-pay | Admitting: Podiatry

## 2019-08-19 NOTE — Telephone Encounter (Signed)
Unable to leave a message on the voicemail the box was full.

## 2019-08-19 NOTE — Telephone Encounter (Signed)
Home phone rang for over 1 minutes without answering service, unable to leave a message.

## 2019-08-19 NOTE — Telephone Encounter (Signed)
Pt called stating he is having callus pain and wanted to know if there was anything over the counter he could use to help. Please give patient a call

## 2019-08-20 NOTE — Telephone Encounter (Signed)
Unable to leave a message the voicemail box is full. 

## 2019-08-31 ENCOUNTER — Other Ambulatory Visit: Payer: Self-pay

## 2019-08-31 ENCOUNTER — Ambulatory Visit (INDEPENDENT_AMBULATORY_CARE_PROVIDER_SITE_OTHER): Payer: Managed Care, Other (non HMO)

## 2019-08-31 ENCOUNTER — Ambulatory Visit (HOSPITAL_COMMUNITY)
Admission: EM | Admit: 2019-08-31 | Discharge: 2019-08-31 | Disposition: A | Discharge status: Home / Self Care | Payer: Managed Care, Other (non HMO) | Attending: Family Medicine | Admitting: Family Medicine

## 2019-08-31 DIAGNOSIS — I739 Peripheral vascular disease, unspecified: Secondary | ICD-10-CM | POA: Diagnosis not present

## 2019-08-31 DIAGNOSIS — M79672 Pain in left foot: Secondary | ICD-10-CM | POA: Diagnosis not present

## 2019-08-31 DIAGNOSIS — M7989 Other specified soft tissue disorders: Secondary | ICD-10-CM | POA: Diagnosis not present

## 2019-08-31 MED ORDER — ACETAMINOPHEN-CODEINE #3 300-30 MG PO TABS: 1.0000 | ORAL_TABLET | Freq: Four times a day (QID) | ORAL | 0 refills | Status: DC | PRN

## 2019-08-31 NOTE — ED Provider Notes (Signed)
[facility]   [id number] 08/31/19 Arrival Time: 1110  IE:PPIRJ PAIN  SUBJECTIVE: History from: patient. Benjamin [name] is a 59 y.o. male complains of left foot pain that has been occurring on and off for the last few months.  Reports he is seeing podiatry.  Reports that it is now worse at night and has been keeping him up for the past 2 days.  He has taken Tylenol with little relief.  Also reports that he has a wound on his great toe that is also been there for months that he seen podiatry for.  Has history of hypertension, hyperlipidemia, peripheral vascular disease.  Symptoms are made worse with activity.  Denies similar symptoms in the past.  Denies fever, chills, erythema, ecchymosis, effusion, weakness, saddle paresthesias, loss of bowel or bladder function.      ROS: As per HPI.  All other pertinent ROS negative.     Past Medical History:  Diagnosis Date  . Back pain   . Heavy smoker   . Hyperlipidemia LDL goal <70   . 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   Bilateral femoral artery disease   Past Surgical History:  Procedure Laterality Date  . ABDOMINAL AORTOGRAM W/LOWER EXTREMITY N/A 07/12/2016   Procedure: Abdominal Aortogram w/Lower Extremity;  Surgeon: Nada Libman, MD;  Location: [facility];  Service: Cardiovascular;  Laterality: N/A;  Bilateral extermity: Patent Renal As. No sig Dz in infrarenal Abd Aorta. Normal Bilat Iliac arteries. R SFA is 100% @ origin - recon in [place] A. R PT A patent. L CFA occluded. L PFA recon @ origin, L SFA occluded w/ recon in [place] Pop A.  . COLONOSCOPY  06/2016   never  . NO PAST SURGERIES  06/2016   Allergies  Allergen Reactions  . Aspirin Nausea And Vomiting  . Garlic Other (See Comments)    sneezing   No current facility-administered medications on file prior to encounter.   Current Outpatient Medications on File Prior to Encounter  Medication Sig Dispense Refill  . acetaminophen (TYLENOL) 500 MG  tablet Take 500 mg by mouth every 6 (six) hours as needed for mild pain or headache.    Marland Kitchen amLODipine (NORVASC) 5 MG tablet Take 1 tablet (5 mg total) by mouth daily. 30 tablet 0  . cilostazol (PLETAL) 100 MG tablet Take 1 tablet (100 mg total) by mouth 2 (two) times daily before a meal. 60 tablet 11  . gabapentin (NEURONTIN) 100 MG capsule TAKE 1 CAPSULE(100 MG) BY MOUTH THREE TIMES DAILY 90 capsule 2  . varenicline (CHANTIX STARTING MONTH PAK) 0.5 MG X 11 & 1 MG X 42 tablet Take one 0.5 mg tablet by mouth once daily for 3 days, then increase to one 0.5 mg tablet twice daily for 4 days, then increase to one 1 mg tablet twice daily. 53 tablet 0  . Vitamin D, Ergocalciferol, (DRISDOL) 1.25 MG (50000 UNIT) CAPS capsule Take 1 capsule (50,000 Units total) by mouth 2 (two) times a week. 24 capsule 1  . Magnesium 200 MG TABS Take 1 tablet by mouth daily with evening meal 30 tablet 2   Social History   Socioeconomic History  . Marital status: Single    Spouse name: Not on file  . Number of children: Not on file  . Years of education: 69  . Highest education level: Not on file  Occupational History  . Not on file  Tobacco Use  . Smoking status: Current Every Day Smoker  Packs/day: 1.50  . Smokeless tobacco: Never Used  . Tobacco comment: At least 20 years  Vaping Use  . Vaping Use: Never used  Substance and Sexual Activity  . Alcohol use: No  . Drug use: No  . Sexual activity: Not Currently  Other Topics Concern  . 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   He lives with his sister and her son.   Walks every day to work at least 15+ minutes.   Social Determinants of Health   Financial Resource Strain:   . Difficulty of Paying Living Expenses:   Food Insecurity:   . Worried About Programme researcher, broadcasting/film/video in the Last Year:   . Barista in the Last Year:   Transportation Needs:   . Freight forwarder (Medical):   Marland Kitchen Lack of  Transportation (Non-Medical):   Physical Activity:   . Days of Exercise per Week:   . Minutes of Exercise per Session:   Stress:   . Feeling of Stress :   Social Connections:   . Frequency of Communication with Friends and Family:   . Frequency of Social Gatherings with Friends and Family:   . Attends Religious Services:   . Active Member of Clubs or Organizations:   . Attends Banker Meetings:   Marland Kitchen Marital Status:   Intimate Partner Violence:   . Fear of Current or Ex-Partner:   . Emotionally Abused:   Marland Kitchen Physically Abused:   . Sexually Abused:    Family History  Problem Relation Age of Onset  . Arthritis Mother   . COPD Father   . Stroke Father   . Multiple sclerosis Sister     OBJECTIVE:  Vitals:   08/31/19 1236  BP: (!) 171/98  Pulse: 77  Resp: 18  Temp: 97.8 F (36.6 C)  TempSrc: Oral  SpO2: 100%    General appearance: ALERT; in no acute distress.  Head: NCAT Lungs: Normal respiratory effort CV:  pulses 2+ bilaterally. Cap refill < 2 seconds Musculoskeletal:  Inspection: Skin warm, dry, clear and intact without obvious erythema, effusion, or ecchymosis.  Left foot swollen, pencil eraser size wound to right great toe, appears open but is not draining Palpation: Nontender to palpation ROM: FROM active and passive Skin: warm and dry Neurologic: Ambulates without difficulty; Sensation intact about the upper/ lower extremities Psychological: alert and cooperative; normal mood and affect  DIAGNOSTIC STUDIES:  DG Foot Complete Left  Result Date: 08/31/2019 CLINICAL DATA:  Pain with soft tissue swelling. Reported soft tissue wound EXAM: LEFT FOOT - COMPLETE 3+ VIEW COMPARISON:  None. FINDINGS: Frontal, oblique, and lateral views were obtained. Bones appear osteoporotic. No fracture or dislocation. Joint spaces appear normal. There is a small inferior calcaneal spur. There is no erosive change or bony destruction evident. No soft tissue air or radiopaque  foreign body. IMPRESSION: Bones osteoporotic. No fracture or dislocation. No erosive change or bony destruction. No appreciable arthropathy. There is an inferior calcaneal spur. Electronically Signed   By: Bretta Bang III M.D.   On: 08/31/2019 13:27     ASSESSMENT & PLAN:  1. Foot pain, left      Meds ordered this encounter  Medications  . acetaminophen-codeine (TYLENOL #3) 300-30 MG tablet    Sig: Take 1 tablet by mouth every 6 (six) hours as needed for moderate pain or severe pain.    Dispense:  10 tablet  Refill:  0    Order Specific Question:   Supervising Provider    Answer:   Merrilee Jansky [[id number]]   X-ray negative  continue conservative management of rest, ice, and gentle stretches Take Tylenol 3 as needed for pain relief Follow up with PCP if symptoms persist Follow-up with podiatry as scheduled Return or go to the ER if you have any new or worsening symptoms (fever, chills, chest pain, abdominal pain, changes in bowel or bladder habits, pain radiating into lower legs)   [place] Controlled Substances Registry consulted for this patient. I feel the risk/benefit ratio today is favorable for proceeding with this prescription for a controlled substance. Medication sedation precautions given.  Reviewed expectations re: course of current medical issues. Questions answered. Outlined signs and symptoms indicating need for more acute intervention. Patient verbalized understanding. After Visit Summary given.       Moshe Cipro, NP 08/31/19 1422

## 2019-08-31 NOTE — Discharge Instructions (Addendum)
I have sent in pain medication for you to take every 6 hours as needed for pain  Your bone does not appear to be infected on xray  Follow up with podiatry as scheduled

## 2019-08-31 NOTE — ED Triage Notes (Signed)
Pt c/o left foot pain at base of great toe. Pt states he has had a wound in area for approx 3 months with drainage. Pt states his Rx of gabapentin helped with pain/wound slightly. Approx 2 cm diameter wound noted at base of left great toe with white drainage.  Denies fever, chills, n/v/d or h/o DM.  Last dose tylenol approx 0930

## 2019-09-13 ENCOUNTER — Ambulatory Visit: Payer: Managed Care, Other (non HMO) | Admitting: Podiatry

## 2019-09-17 ENCOUNTER — Encounter: Payer: Self-pay | Admitting: Podiatry

## 2019-09-17 ENCOUNTER — Other Ambulatory Visit: Payer: Self-pay

## 2019-09-17 ENCOUNTER — Ambulatory Visit (INDEPENDENT_AMBULATORY_CARE_PROVIDER_SITE_OTHER): Payer: Managed Care, Other (non HMO)

## 2019-09-17 ENCOUNTER — Ambulatory Visit (INDEPENDENT_AMBULATORY_CARE_PROVIDER_SITE_OTHER): Payer: Managed Care, Other (non HMO) | Admitting: Podiatry

## 2019-09-17 DIAGNOSIS — L97522 Non-pressure chronic ulcer of other part of left foot with fat layer exposed: Secondary | ICD-10-CM

## 2019-09-17 DIAGNOSIS — M79675 Pain in left toe(s): Secondary | ICD-10-CM | POA: Diagnosis not present

## 2019-09-17 DIAGNOSIS — B351 Tinea unguium: Secondary | ICD-10-CM

## 2019-09-17 DIAGNOSIS — M79674 Pain in right toe(s): Secondary | ICD-10-CM | POA: Diagnosis not present

## 2019-09-17 DIAGNOSIS — F17200 Nicotine dependence, unspecified, uncomplicated: Secondary | ICD-10-CM | POA: Diagnosis not present

## 2019-09-17 DIAGNOSIS — I739 Peripheral vascular disease, unspecified: Secondary | ICD-10-CM

## 2019-09-17 MED ORDER — SANTYL 250 UNIT/GM EX OINT: 1.0000 "application " | TOPICAL_OINTMENT | Freq: Every day | CUTANEOUS | 0 refills | Status: DC

## 2019-09-17 NOTE — Patient Instructions (Signed)
DRESSING CHANGES L hallux:   A. IF DISPENSED, WEAR SURGICAL SHOE/BOOT AT ALL TIMES.  B. IF PRESCRIBED ORAL ANTIBIOTICS, TAKE ALL MEDICATION AS PRESCRIBED UNTIL ALL ARE GONE.  C. IF DOCTOR HAS DESIGNATED NONWEIGHTBEARING STATUS, PLEASE ADHERE TO INSTRUCTIONS.   1. KEEP LEFT FOOT DRY AT ALL TIMES!!!!  2. CLEANSE ULCER WITH SALINE.  3. DAB DRY WITH GAUZE SPONGE.  4. APPLY A LIGHT AMOUNT OF SANTYL TO BASE OF ULCER.  5. APPLY OUTER DRESSING AS INSTRUCTED.  6. WEAR SURGICAL SHOE/BOOT DAILY AT ALL TIMES. IF SUPPLIED, WEAR HEEL PROTECTORS AT ALL TIMES WHEN IN BED.  7. DO NOT WALK BAREFOOT!!!  8.  IF YOU EXPERIENCE ANY FEVER, CHILLS, NIGHTSWEATS, NAUSEA OR VOMITING, ELEVATED OR LOW BLOOD SUGARS, REPORT TO EMERGENCY ROOM.  9. IF YOU EXPERIENCE INCREASED REDNESS, PAIN, SWELLING, DISCOLORATION, ODOR, PUS, DRAINAGE OR WARMTH OF YOUR FOOT, REPORT TO EMERGENCY ROOM.

## 2019-09-17 NOTE — Progress Notes (Signed)
°  Subjective:  Patient ID: Benjamin [name], male    DOB: Dec 12, 1960,  MRN: [id number]  Chief Complaint  Patient presents with   Nail Problem    thick painful toenails, 9 week follow up   Callouses    painful callus lesions   Plantar Fasciitis    history of left foot pain    59 y.o. male presents with the above complaint. History confirmed with patient.  He is referred to me by Dr. Eloy End who saw him today in clinic and referred him to me for further care of a new ulceration on the left foot.  He states that he has a number of corns and calluses and started using a Dr. Margart Sickles callus and corn remover pad and cream.  When he took the pad off he noticed an ulceration of the great toe.  This is very painful for him.  He has a history of peripheral vascular disease, he follows with Dr. Coral Else, MD.  He often has pain in the legs while walking as well as at night.  He states that his pain is much worse in his legs and thighs when he raises his legs and elevates them  Objective:  Physical Exam: blanching with elevation noted, dependent rubor present, DP absent left, normal sensory exam, PT reduced left and ulceration at dorsal medial first MTPJ, the left lower extremity has slight mottling in the toes and is cool to touch on the toes, this gradient improved to warm at the midfoot. Left Foot: Full-thickness ulceration with fibrotic base approximately 4 mm x 4 mm x 3 mm       Assessment:   1. Skin ulcer of left great toe with fat layer exposed (HCC)   2. Peripheral vascular disease (HCC)   3. Intermittent claudication (HCC)   4. Heavy smoker      Plan:  Patient was evaluated and treated and all questions answered.  -Recommend current local wound care for the ulceration.  Santyl prescribed, and instructions given on how to apply daily. -He has significant PAD, symptoms of rest pain and intermittent claudication.  Arterial ultrasound and ABIs with TBI's will be ordered -Referral be  placed to his previous vascular doctor, Dr. Coral Else, MD at [facility] -Return in 1 week to recheck wound  Sharl Ma, DPM 09/17/2019

## 2019-09-19 ENCOUNTER — Telehealth: Payer: Self-pay | Admitting: *Deleted

## 2019-09-19 DIAGNOSIS — I739 Peripheral vascular disease, unspecified: Secondary | ICD-10-CM

## 2019-09-19 DIAGNOSIS — L97522 Non-pressure chronic ulcer of other part of left foot with fat layer exposed: Secondary | ICD-10-CM

## 2019-09-19 NOTE — Telephone Encounter (Signed)
-----   Message from Edwin Cap, DPM sent at 09/17/2019  2:13 PM EDT ----- Regarding: Vascular testing and referral Hi Val,  Can you order left lower arterial US and multi-level ABIs with waveforms and toe pressures? Dx is PAD and left foot ulcer. He is known to Dr Coral Else at [facility] if you can send a referral, he has not seen him in some time.  Sharl Ma, DPM 09/17/2019

## 2019-09-19 NOTE — Telephone Encounter (Signed)
Faxed required form, clinical LOV and demographics with orders to [facility]

## 2019-09-23 ENCOUNTER — Other Ambulatory Visit: Payer: Self-pay

## 2019-09-23 DIAGNOSIS — I739 Peripheral vascular disease, unspecified: Secondary | ICD-10-CM

## 2019-09-24 ENCOUNTER — Other Ambulatory Visit: Payer: Self-pay

## 2019-09-24 DIAGNOSIS — I739 Peripheral vascular disease, unspecified: Secondary | ICD-10-CM

## 2019-09-26 ENCOUNTER — Ambulatory Visit: Payer: Managed Care, Other (non HMO) | Admitting: Podiatry

## 2019-09-26 ENCOUNTER — Other Ambulatory Visit: Payer: Self-pay

## 2019-09-26 ENCOUNTER — Encounter (HOSPITAL_COMMUNITY): Payer: Managed Care, Other (non HMO)

## 2019-09-26 ENCOUNTER — Ambulatory Visit (INDEPENDENT_AMBULATORY_CARE_PROVIDER_SITE_OTHER): Payer: Managed Care, Other (non HMO) | Admitting: Physician Assistant

## 2019-09-26 ENCOUNTER — Ambulatory Visit (HOSPITAL_COMMUNITY): Admission: RE | Admit: 2019-09-26 | Payer: Managed Care, Other (non HMO) | Source: Ambulatory Visit

## 2019-09-26 ENCOUNTER — Ambulatory Visit (HOSPITAL_COMMUNITY)
Admission: RE | Admit: 2019-09-26 | Discharge: 2019-09-26 | Disposition: A | Discharge status: Home / Self Care | Payer: Managed Care, Other (non HMO) | Source: Ambulatory Visit | Attending: Surgery | Admitting: Surgery

## 2019-09-26 ENCOUNTER — Ambulatory Visit: Payer: Managed Care, Other (non HMO)

## 2019-09-26 ENCOUNTER — Ambulatory Visit (INDEPENDENT_AMBULATORY_CARE_PROVIDER_SITE_OTHER)
Admission: RE | Admit: 2019-09-26 | Discharge: 2019-09-26 | Disposition: A | Discharge status: Home / Self Care | Payer: Managed Care, Other (non HMO) | Source: Ambulatory Visit | Attending: Surgery | Admitting: Surgery

## 2019-09-26 VITALS — BP 164/105 | HR 68 | Temp 98.1°F | Resp 20 | Ht 67.0 in | Wt 145.6 lb

## 2019-09-26 DIAGNOSIS — I739 Peripheral vascular disease, unspecified: Secondary | ICD-10-CM

## 2019-09-26 DIAGNOSIS — I70229 Atherosclerosis of native arteries of extremities with rest pain, unspecified extremity: Secondary | ICD-10-CM

## 2019-09-26 DIAGNOSIS — I998 Other disorder of circulatory system: Secondary | ICD-10-CM

## 2019-09-26 MED ORDER — CILOSTAZOL 100 MG PO TABS: 100.0000 mg | ORAL_TABLET | Freq: Two times a day (BID) | ORAL | 1 refills | Status: DC

## 2019-09-26 MED ORDER — ATORVASTATIN CALCIUM 20 MG PO TABS: 20.0000 mg | ORAL_TABLET | Freq: Every day | ORAL | 3 refills | Status: DC

## 2019-09-26 NOTE — Progress Notes (Signed)
HISTORY AND PHYSICAL     CC:  follow up. Requesting Provider:  Arnette FeltsMoore, Janece, FNP  HPI: This is a 59 y.o. male who is here today for follow up for PAD.  He has not been seen here since 2018 by Dr. Myra GianottiBrabham.    Pt was last seen June 2018 and at that time, he was having left leg pain.  He had undergone angiography and was found to have occlusion of the left superficial femoral and popliteal artery with reconstitution of the posterior tibial artery.  There was diffuse disease throughout the right superficial femoral and popliteal artery with occlusion of the distal popliteal artery and reconstitution of the posterior tibial artery. He was sent for cardiac clearance and deemed to be low risk.  Dr. Myra GianottiBrabham felt he may need a tibial bypass but wanted him to do a walking program and started him on Cilatazol.  He had continued to smoke.  He was taking a statin/asa.He was to follow up in 3 months.   The pt returns today for worsening pain in his left leg and a non healing wound.  He states the wound has been present for about 6 months.  He states it is not getting any worse or better. He states that he has pain and throbbing in his left foot at night and this improves with walking around.  He states that he cannot wiggle his toes on the left foot.  He states that he can walk a city block, but would have to stop and rest.  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    He states that he is not taking his statin or asa.  He states that he is allergic to aspirin.    Pt states he has continued to lose weight.  He states his normal weight is around 160-170.  He weighs 145lbs today.  He denies any blood in his stool.  He denies any hemoptysis.  He was supposed to go for colonoscopy to be evaluated for this earlier this year, but states he was too busy.    The pt is not on a statin for cholesterol management.    The pt is not on an aspirin.    Other AC:  none The pt is on CCB for hypertension.  The pt  does not have diabetes. Tobacco hx:  current   Past Medical History:  Diagnosis Date  . Back pain   . Heavy smoker   . Hyperlipidemia LDL goal <70   . 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   Bilateral femoral artery disease    Past Surgical History:  Procedure Laterality Date  . ABDOMINAL AORTOGRAM W/LOWER EXTREMITY N/A 07/12/2016   Procedure: Abdominal Aortogram w/Lower Extremity;  Surgeon: Nada LibmanBrabham, Vance W, MD;  Location: [facility];  Service: Cardiovascular;  Laterality: N/A;  Bilateral extermity: Patent Renal As. No sig Dz in infrarenal Abd Aorta. Normal Bilat Iliac arteries. R SFA is 100% @ origin - recon in AK-Pop A. R PT A patent. L CFA occluded. L PFA recon @ origin, L SFA occluded w/ recon in AK Pop A.  . COLONOSCOPY  06/2016   never  . NO PAST SURGERIES  06/2016    Allergies  Allergen Reactions  . Aspirin Nausea And Vomiting  . Garlic Other (See Comments)    sneezing    Current Outpatient Medications  Medication Sig Dispense Refill  . acetaminophen (TYLENOL) 500 MG tablet Take 500 mg by mouth every 6 (  six) hours as needed for mild pain or headache.    Marland Kitchen acetaminophen-codeine (TYLENOL #3) 300-30 MG tablet Take 1 tablet by mouth every 6 (six) hours as needed for moderate pain or severe pain. 10 tablet 0  . amLODipine (NORVASC) 5 MG tablet Take 1 tablet (5 mg total) by mouth daily. 30 tablet 0  . cilostazol (PLETAL) 100 MG tablet Take 1 tablet (100 mg total) by mouth 2 (two) times daily before a meal. 60 tablet 11  . collagenase (SANTYL) ointment Apply 1 application topically daily. Apply ointment the thickness of a nickel to the wound daily with a saline moistened gauze wrap and then dry gauze and gauze wrap 15 g 0  . gabapentin (NEURONTIN) 100 MG capsule TAKE 1 CAPSULE(100 MG) BY MOUTH THREE TIMES DAILY 90 capsule 2  . Magnesium 200 MG TABS Take 1 tablet by mouth daily with evening meal 30 tablet 2  . varenicline (CHANTIX STARTING MONTH PAK) 0.5 MG  X 11 & 1 MG X 42 tablet Take one 0.5 mg tablet by mouth once daily for 3 days, then increase to one 0.5 mg tablet twice daily for 4 days, then increase to one 1 mg tablet twice daily. 53 tablet 0  . Vitamin D, Ergocalciferol, (DRISDOL) 1.25 MG (50000 UNIT) CAPS capsule Take 1 capsule (50,000 Units total) by mouth 2 (two) times a week. 24 capsule 1   No current facility-administered medications for this visit.    Family History  Problem Relation Age of Onset  . Arthritis Mother   . COPD Father   . Stroke Father   . Multiple sclerosis Sister     Social History   Socioeconomic History  . Marital status: Single    Spouse name: Not on file  . Number of children: Not on file  . Years of education: 30  . Highest education level: Not on file  Occupational History  . Not on file  Tobacco Use  . Smoking status: Current Every Day Smoker    Packs/day: 1.50  . Smokeless tobacco: Never Used  . Tobacco comment: At least 20 years  Vaping Use  . Vaping Use: Never used  Substance and Sexual Activity  . Alcohol use: No  . Drug use: No  . Sexual activity: Not Currently  Other Topics Concern  . 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   He lives with his sister and her son.   Walks every day to work at least 15+ minutes.   Social Determinants of Health   Financial Resource Strain:   . Difficulty of Paying Living Expenses:   Food Insecurity:   . Worried About Programme researcher, broadcasting/film/video in the Last Year:   . Barista in the Last Year:   Transportation Needs:   . Freight forwarder (Medical):   Marland Kitchen Lack of Transportation (Non-Medical):   Physical Activity:   . Days of Exercise per Week:   . Minutes of Exercise per Session:   Stress:   . Feeling of Stress :   Social Connections:   . Frequency of Communication with Friends and Family:   . Frequency of Social Gatherings with Friends and Family:   . Attends Religious Services:   .  Active Member of Clubs or Organizations:   . Attends Banker Meetings:   Marland Kitchen Marital Status:   Intimate Partner Violence:   . Fear of Current or Ex-Partner:   .  Emotionally Abused:   . Physically Abused:   . Sexually Abused:      REVIEW OF SYSTEMS:   [X] denotes positive finding, [ ] denotes negative finding Cardiac  Comments:  Chest pain or chest pressure:    Shortness of breath upon exertion:    Short of breath when lying flat:    Irregular heart rhythm:        Vascular    Pain in calf, thigh, or hip brought on by ambulation: x   Pain in feet at night that wakes you up from your sleep:  x   Blood clot in your veins:    Leg swelling:  x       Pulmonary    Oxygen at home:    Productive cough:     Wheezing:         Neurologic    Sudden weakness in arms or legs:     Sudden numbness in arms or legs:     Sudden onset of difficulty speaking or slurred speech:    Temporary loss of vision in one eye:     Problems with dizziness:         Gastrointestinal    Blood in stool:     Vomited blood:         Genitourinary    Burning when urinating:     Blood in urine:        Psychiatric    Major depression:         Hematologic    Bleeding problems:    Problems with blood clotting too easily:        Skin    Rashes or ulcers:        Constitutional    Fever or chills:      PHYSICAL EXAMINATION:  Today's Vitals   09/26/19 0911  BP: (!) 164/105  Pulse: 68  Resp: 20  Temp: 98.1 F (36.7 C)  TempSrc: Temporal  SpO2: 95%  Weight: 145 lb 9.6 oz (66 kg)  Height: 5' 7" (1.702 m)  PainSc: 8   PainLoc: Leg   Body mass index is 22.8 kg/m.   General:  WDWN in NAD; vital signs documented above Gait: Not observed HENT: WNL, normocephalic Pulmonary: normal non-labored breathing , without wheezing Cardiac: regular HR, without  Murmur; without carotid bruits Abdomen: soft, NT, no masses Skin: without rashes Vascular Exam/Pulses:  Right Left  Radial 2+  (normal) 2+ (normal)  Ulnar Unable to palpate Unable to palpate  Femoral 2+ (normal) Unable to palpate  Popliteal Unable to palpate Unable to palpate  DP Unable to palpate Unable to palpate  PT Unable to palpate Unable to palpate   Extremities:     Pt unable to wiggle toes on the left. Significantly decreased sensory on the left.    Motor/sensory in tact on the right.  Musculoskeletal: no muscle wasting or atrophy  Neurologic: A&O X 3;  No focal weakness or paresthesias are detected Psychiatric:  The pt has Normal affect.   Invasive Vascular Imaging:   ABI/TBI 09/26/2019: Right:  0.55/0.41 Left:  0.18/0.00  BLE arterial duplex 09/26/2019: +----------+--------+-----+--------+----------+--------+  RIGHT   PSV cm/sRatioStenosisWaveform Comments  +----------+--------+-----+--------+----------+--------+  CIA Prox 51          monophasic      +----------+--------+-----+--------+----------+--------+  CIA Distal429          monophasic      +----------+--------+-----+--------+----------+--------+  CFA Distal41          monophasic      +----------+--------+-----+--------+----------+--------+    DFA    68          monophasic      +----------+--------+-----+--------+----------+--------+  SFA Prox 0       occludedabsent        +----------+--------+-----+--------+----------+--------+  SFA Mid  0       occluded           +----------+--------+-----+--------+----------+--------+  SFA Distal0       occluded           +----------+--------+-----+--------+----------+--------+  POP Prox 17          monophasic      +----------+--------+-----+--------+----------+--------+  POP Distal20          monophasic      +----------+--------+-----+--------+----------+--------+  ATA Distal0       occluded            +----------+--------+-----+--------+----------+--------+  PTA Distal17          monophasic      +----------+--------+-----+--------+----------+--------+   A focal velocity elevation of 429 cm/s was obtained at Common iliac artery  with a VR of 8.4. Findings are characteristic of 75-99% stenosis. A 2nd  focal velocity elevation was visualized, measuring 0 cm/s at Superficial  femoral artery. Findings are  characteristic of occluded.      +----------+--------+-----+--------+----------+--------+  LEFT   PSV cm/sRatioStenosisWaveform Comments  +----------+--------+-----+--------+----------+--------+  CIA Prox 0       occludedabsent        +----------+--------+-----+--------+----------+--------+  CIA Mid  0       occluded           +----------+--------+-----+--------+----------+--------+  CIA Distal0       occluded           +----------+--------+-----+--------+----------+--------+  CFA Prox 0       occluded           +----------+--------+-----+--------+----------+--------+  CFA Mid  0       occluded           +----------+--------+-----+--------+----------+--------+  CFA Distal0       occluded           +----------+--------+-----+--------+----------+--------+  DFA    21          monophasic      +----------+--------+-----+--------+----------+--------+  SFA Prox 0       occludedabsent        +----------+--------+-----+--------+----------+--------+  SFA Mid         occluded           +----------+--------+-----+--------+----------+--------+  SFA Distal       occluded           +----------+--------+-----+--------+----------+--------+  POP Prox 23          continuous       +----------+--------+-----+--------+----------+--------+  POP Distal10          continuous      +----------+--------+-----+--------+----------+--------+  ATA Distal0       occluded           +----------+--------+-----+--------+----------+--------+  PTA Distal0       occluded           +----------+--------+-----+--------+----------+--------+     Summary:  Right: 75-99% stenosis noted in the iliac segment. Total occlusion noted  in the superficial femoral artery. Total occlusion noted in the dorsal  pedis artery.   Left: Total occlusion noted in the iliac segment. Total occlusion noted in  the common femoral artery. Total occlusion noted in the superficial  femoral artery. Total occlusion noted in the posterior tibial artery.  Total occlusion noted in the dorsal pedis  artery.    Aortogram 07/12/2016 Findings:             Aortogram:  The renal arteries are patent without significant stenosis.  The infrarenal abdominal aorta is patent without significant stenosis.  The right common and external iliac artery are patent throughout their course.  The left common iliac is patent.  The left external iliac artery is patent down to just above the femoral head where it occludes.            Right Lower Extremity:  The right common femoral artery is patent.  The profunda femoral artery is widely patent.  The right superficial femoral artery is occluded at its origin.  There is reconstitution of the above-knee popliteal artery.  Tibial vessel evaluation is somewhat limited due to patient movement.  The posterior tibial artery does appear to be opened.            Left Lower Extremity:  The left common femoral artery is occluded.  There is reconstitution of the fundus femoral artery at its origin.  The superficial femoral artery is occluded.  There is reconstitution of the above-knee popliteal artery.  Limited evaluation of the tibial  vessels.  BLE vein mapping done 07/22/2016 and results in CV results.   ASSESSMENT/PLAN:: 59 y.o. male here for follow up for non healing wound left foot and rest pain left lower extremity.  -Pt ABI today 0.18 on the left with toe pressure of 0.  His left leg is occluded from the CIA distally.  Pt with critical limb ischemia.  Discussed with pt that he will need arteriogram and we will schedule this for next Tuesday with Dr. Myra Gianotti.  Pt has absent left femoral pulse. -Discussed with pt that he is at risk for limb loss.  Discussed with him to protect his feet to prevent further wounds.   -continue current wound care.   -discussed the need for him to quit smoking.  -he has had significant weight loss over the past year.  He denies any melena, hematochezia or hemoptysis.  He did not follow up for colonoscopy that PCP recommended.  Will schedule CXR when he comes in for his arteriogram.  -rx sent to pharmacy for Lipitor 20mg  daily #30 three refills.    , [facility] [phone]  Clinic MD:   [phone]

## 2019-09-26 NOTE — H&P (View-Only) (Signed)
HISTORY AND PHYSICAL     CC:  follow up. Requesting Provider:  Arnette FeltsMoore, Janece, FNP  HPI: This is a 59 y.o. male who is here today for follow up for PAD.  He has not been seen here since 2018 by Dr. Myra GianottiBrabham.    Pt was last seen June 2018 and at that time, he was having left leg pain.  He had undergone angiography and was found to have occlusion of the left superficial femoral and popliteal artery with reconstitution of the posterior tibial artery.  There was diffuse disease throughout the right superficial femoral and popliteal artery with occlusion of the distal popliteal artery and reconstitution of the posterior tibial artery. He was sent for cardiac clearance and deemed to be low risk.  Dr. Myra GianottiBrabham felt he may need a tibial bypass but wanted him to do a walking program and started him on Cilatazol.  He had continued to smoke.  He was taking a statin/asa.He was to follow up in 3 months.   The pt returns today for worsening pain in his left leg and a non healing wound.  He states the wound has been present for about 6 months.  He states it is not getting any worse or better. He states that he has pain and throbbing in his left foot at night and this improves with walking around.  He states that he cannot wiggle his toes on the left foot.  He states that he can walk a city block, but would have to stop and rest.  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    He states that he is not taking his statin or asa.  He states that he is allergic to aspirin.    Pt states he has continued to lose weight.  He states his normal weight is around 160-170.  He weighs 145lbs today.  He denies any blood in his stool.  He denies any hemoptysis.  He was supposed to go for colonoscopy to be evaluated for this earlier this year, but states he was too busy.    The pt is not on a statin for cholesterol management.    The pt is not on an aspirin.    Other AC:  none The pt is on CCB for hypertension.  The pt  does not have diabetes. Tobacco hx:  current   Past Medical History:  Diagnosis Date  . Back pain   . Heavy smoker   . Hyperlipidemia LDL goal <70   . 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   Bilateral femoral artery disease    Past Surgical History:  Procedure Laterality Date  . ABDOMINAL AORTOGRAM W/LOWER EXTREMITY N/A 07/12/2016   Procedure: Abdominal Aortogram w/Lower Extremity;  Surgeon: Nada LibmanBrabham, Vance W, MD;  Location: [facility];  Service: Cardiovascular;  Laterality: N/A;  Bilateral extermity: Patent Renal As. No sig Dz in infrarenal Abd Aorta. Normal Bilat Iliac arteries. R SFA is 100% @ origin - recon in AK-Pop A. R PT A patent. L CFA occluded. L PFA recon @ origin, L SFA occluded w/ recon in AK Pop A.  . COLONOSCOPY  06/2016   never  . NO PAST SURGERIES  06/2016    Allergies  Allergen Reactions  . Aspirin Nausea And Vomiting  . Garlic Other (See Comments)    sneezing    Current Outpatient Medications  Medication Sig Dispense Refill  . acetaminophen (TYLENOL) 500 MG tablet Take 500 mg by mouth every 6 (  six) hours as needed for mild pain or headache.    Marland Kitchen acetaminophen-codeine (TYLENOL #3) 300-30 MG tablet Take 1 tablet by mouth every 6 (six) hours as needed for moderate pain or severe pain. 10 tablet 0  . amLODipine (NORVASC) 5 MG tablet Take 1 tablet (5 mg total) by mouth daily. 30 tablet 0  . cilostazol (PLETAL) 100 MG tablet Take 1 tablet (100 mg total) by mouth 2 (two) times daily before a meal. 60 tablet 11  . collagenase (SANTYL) ointment Apply 1 application topically daily. Apply ointment the thickness of a nickel to the wound daily with a saline moistened gauze wrap and then dry gauze and gauze wrap 15 g 0  . gabapentin (NEURONTIN) 100 MG capsule TAKE 1 CAPSULE(100 MG) BY MOUTH THREE TIMES DAILY 90 capsule 2  . Magnesium 200 MG TABS Take 1 tablet by mouth daily with evening meal 30 tablet 2  . varenicline (CHANTIX STARTING MONTH PAK) 0.5 MG  X 11 & 1 MG X 42 tablet Take one 0.5 mg tablet by mouth once daily for 3 days, then increase to one 0.5 mg tablet twice daily for 4 days, then increase to one 1 mg tablet twice daily. 53 tablet 0  . Vitamin D, Ergocalciferol, (DRISDOL) 1.25 MG (50000 UNIT) CAPS capsule Take 1 capsule (50,000 Units total) by mouth 2 (two) times a week. 24 capsule 1   No current facility-administered medications for this visit.    Family History  Problem Relation Age of Onset  . Arthritis Mother   . COPD Father   . Stroke Father   . Multiple sclerosis Sister     Social History   Socioeconomic History  . Marital status: Single    Spouse name: Not on file  . Number of children: Not on file  . Years of education: 30  . Highest education level: Not on file  Occupational History  . Not on file  Tobacco Use  . Smoking status: Current Every Day Smoker    Packs/day: 1.50  . Smokeless tobacco: Never Used  . Tobacco comment: At least 20 years  Vaping Use  . Vaping Use: Never used  Substance and Sexual Activity  . Alcohol use: No  . Drug use: No  . Sexual activity: Not Currently  Other Topics Concern  . 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   He lives with his sister and her son.   Walks every day to work at least 15+ minutes.   Social Determinants of Health   Financial Resource Strain:   . Difficulty of Paying Living Expenses:   Food Insecurity:   . Worried About Programme researcher, broadcasting/film/video in the Last Year:   . Barista in the Last Year:   Transportation Needs:   . Freight forwarder (Medical):   Marland Kitchen Lack of Transportation (Non-Medical):   Physical Activity:   . Days of Exercise per Week:   . Minutes of Exercise per Session:   Stress:   . Feeling of Stress :   Social Connections:   . Frequency of Communication with Friends and Family:   . Frequency of Social Gatherings with Friends and Family:   . Attends Religious Services:   .  Active Member of Clubs or Organizations:   . Attends Banker Meetings:   Marland Kitchen Marital Status:   Intimate Partner Violence:   . Fear of Current or Ex-Partner:   .  Emotionally Abused:   Marland Kitchen Physically Abused:   . Sexually Abused:      REVIEW OF SYSTEMS:    denotes positive finding,  denotes negative finding Cardiac  Comments:  Chest pain or chest pressure:    Shortness of breath upon exertion:    Short of breath when lying flat:    Irregular heart rhythm:        Vascular    Pain in calf, thigh, or hip brought on by ambulation: x   Pain in feet at night that wakes you up from your sleep:  x   Blood clot in your veins:    Leg swelling:  x       Pulmonary    Oxygen at home:    Productive cough:     Wheezing:         Neurologic    Sudden weakness in arms or legs:     Sudden numbness in arms or legs:     Sudden onset of difficulty speaking or slurred speech:    Temporary loss of vision in one eye:     Problems with dizziness:         Gastrointestinal    Blood in stool:     Vomited blood:         Genitourinary    Burning when urinating:     Blood in urine:        Psychiatric    Major depression:         Hematologic    Bleeding problems:    Problems with blood clotting too easily:        Skin    Rashes or ulcers:        Constitutional    Fever or chills:      PHYSICAL EXAMINATION:  Today's Vitals   09/26/19 0911  BP: (!) 164/105  Pulse: 68  Resp: 20  Temp: 98.1 F (36.7 C)  TempSrc: Temporal  SpO2: 95%  Weight: 145 lb 9.6 oz (66 kg)  Height:  (1.702 m)  PainSc: 8   PainLoc: Leg   Body mass index is 22.8 kg/m.   General:  WDWN in NAD; vital signs documented above Gait: Not observed HENT: WNL, normocephalic Pulmonary: normal non-labored breathing , without wheezing Cardiac: regular HR, without  Murmur; without carotid bruits Abdomen: soft, NT, no masses Skin: without rashes Vascular Exam/Pulses:  Right Left  Radial 2+  (normal) 2+ (normal)  Ulnar Unable to palpate Unable to palpate  Femoral 2+ (normal) Unable to palpate  Popliteal Unable to palpate Unable to palpate  DP Unable to palpate Unable to palpate  PT Unable to palpate Unable to palpate   Extremities:     Pt unable to wiggle toes on the left. Significantly decreased sensory on the left.    Motor/sensory in tact on the right.  Musculoskeletal: no muscle wasting or atrophy  Neurologic: A&O X 3;  No focal weakness or paresthesias are detected Psychiatric:  The pt has Normal affect.   Invasive Vascular Imaging:   ABI/TBI 09/26/2019: Right:  0.55/0.41 Left:  0.18/0.00  BLE arterial duplex 09/26/2019: +----------+--------+-----+--------+----------+--------+  RIGHT   PSV cm/sRatioStenosisWaveform Comments  +----------+--------+-----+--------+----------+--------+  CIA Prox 51          monophasic      +----------+--------+-----+--------+----------+--------+  CIA Distal429          monophasic      +----------+--------+-----+--------+----------+--------+  CFA Distal41          monophasic      +----------+--------+-----+--------+----------+--------+  DFA    68          monophasic      +----------+--------+-----+--------+----------+--------+  SFA Prox 0       occludedabsent        +----------+--------+-----+--------+----------+--------+  SFA Mid  0       occluded           +----------+--------+-----+--------+----------+--------+  SFA Distal0       occluded           +----------+--------+-----+--------+----------+--------+  POP Prox 17          monophasic      +----------+--------+-----+--------+----------+--------+  POP Distal20          monophasic      +----------+--------+-----+--------+----------+--------+  ATA Distal0       occluded            +----------+--------+-----+--------+----------+--------+  PTA Distal17          monophasic      +----------+--------+-----+--------+----------+--------+   A focal velocity elevation of 429 cm/s was obtained at Common iliac artery  with a VR of 8.4. Findings are characteristic of 75-99% stenosis. A 2nd  focal velocity elevation was visualized, measuring 0 cm/s at Superficial  femoral artery. Findings are  characteristic of occluded.      +----------+--------+-----+--------+----------+--------+  LEFT   PSV cm/sRatioStenosisWaveform Comments  +----------+--------+-----+--------+----------+--------+  CIA Prox 0       occludedabsent        +----------+--------+-----+--------+----------+--------+  CIA Mid  0       occluded           +----------+--------+-----+--------+----------+--------+  CIA Distal0       occluded           +----------+--------+-----+--------+----------+--------+  CFA Prox 0       occluded           +----------+--------+-----+--------+----------+--------+  CFA Mid  0       occluded           +----------+--------+-----+--------+----------+--------+  CFA Distal0       occluded           +----------+--------+-----+--------+----------+--------+  DFA    21          monophasic      +----------+--------+-----+--------+----------+--------+  SFA Prox 0       occludedabsent        +----------+--------+-----+--------+----------+--------+  SFA Mid         occluded           +----------+--------+-----+--------+----------+--------+  SFA Distal       occluded           +----------+--------+-----+--------+----------+--------+  POP Prox 23          continuous       +----------+--------+-----+--------+----------+--------+  POP Distal10          continuous      +----------+--------+-----+--------+----------+--------+  ATA Distal0       occluded           +----------+--------+-----+--------+----------+--------+  PTA Distal0       occluded           +----------+--------+-----+--------+----------+--------+     Summary:  Right: 75-99% stenosis noted in the iliac segment. Total occlusion noted  in the superficial femoral artery. Total occlusion noted in the dorsal  pedis artery.   Left: Total occlusion noted in the iliac segment. Total occlusion noted in  the common femoral artery. Total occlusion noted in the superficial  femoral artery. Total occlusion noted in the posterior tibial artery.  Total occlusion noted in the dorsal pedis  artery.    Aortogram 07/12/2016 Findings:             Aortogram:  The renal arteries are patent without significant stenosis.  The infrarenal abdominal aorta is patent without significant stenosis.  The right common and external iliac artery are patent throughout their course.  The left common iliac is patent.  The left external iliac artery is patent down to just above the femoral head where it occludes.            Right Lower Extremity:  The right common femoral artery is patent.  The profunda femoral artery is widely patent.  The right superficial femoral artery is occluded at its origin.  There is reconstitution of the above-knee popliteal artery.  Tibial vessel evaluation is somewhat limited due to patient movement.  The posterior tibial artery does appear to be opened.            Left Lower Extremity:  The left common femoral artery is occluded.  There is reconstitution of the fundus femoral artery at its origin.  The superficial femoral artery is occluded.  There is reconstitution of the above-knee popliteal artery.  Limited evaluation of the tibial  vessels.  BLE vein mapping done 07/22/2016 and results in CV results.   ASSESSMENT/PLAN:: 59 y.o. male here for follow up for non healing wound left foot and rest pain left lower extremity.  -Pt ABI today 0.18 on the left with toe pressure of 0.  His left leg is occluded from the CIA distally.  Pt with critical limb ischemia.  Discussed with pt that he will need arteriogram and we will schedule this for next Tuesday with Dr. Myra Gianotti.  Pt has absent left femoral pulse. -Discussed with pt that he is at risk for limb loss.  Discussed with him to protect his feet to prevent further wounds.   -continue current wound care.   -discussed the need for him to quit smoking.  -he has had significant weight loss over the past year.  He denies any melena, hematochezia or hemoptysis.  He did not follow up for colonoscopy that PCP recommended.  Will schedule CXR when he comes in for his arteriogram.  -rx sent to pharmacy for Lipitor 20mg  daily #30 three refills.    , [facility] [phone]  Clinic MD:   [phone]

## 2019-09-27 ENCOUNTER — Telehealth: Payer: Self-pay

## 2019-09-27 NOTE — Telephone Encounter (Signed)
Pt called to cancel his AGM on 8/10 and wants to r/s for Sept. I have tried to call him back to r/s this but not getting an answer; left VM for him to call us to r/s.

## 2019-09-27 NOTE — Telephone Encounter (Signed)
Attempted to leave VM for pt to return our call to r/s procedure but his VM is full.

## 2019-09-30 ENCOUNTER — Other Ambulatory Visit (HOSPITAL_COMMUNITY): Payer: Managed Care, Other (non HMO)

## 2019-10-01 ENCOUNTER — Encounter (HOSPITAL_COMMUNITY): Admission: RE | Payer: Self-pay | Source: Home / Self Care

## 2019-10-01 ENCOUNTER — Ambulatory Visit (HOSPITAL_COMMUNITY): Admission: RE | Admit: 2019-10-01 | Payer: Managed Care, Other (non HMO) | Source: Home / Self Care | Admitting: Surgery

## 2019-10-01 ENCOUNTER — Other Ambulatory Visit: Payer: Self-pay

## 2019-10-01 ENCOUNTER — Ambulatory Visit: Payer: Managed Care, Other (non HMO) | Admitting: Nurse Practitioner

## 2019-10-01 ENCOUNTER — Encounter: Payer: Self-pay | Admitting: Nurse Practitioner

## 2019-10-01 VITALS — BP 160/98 | HR 83 | Temp 97.4°F | Ht 67.0 in | Wt 146.0 lb

## 2019-10-01 DIAGNOSIS — E559 Vitamin D deficiency, unspecified: Secondary | ICD-10-CM | POA: Diagnosis not present

## 2019-10-01 DIAGNOSIS — E785 Hyperlipidemia, unspecified: Secondary | ICD-10-CM | POA: Diagnosis not present

## 2019-10-01 DIAGNOSIS — I739 Peripheral vascular disease, unspecified: Secondary | ICD-10-CM | POA: Diagnosis not present

## 2019-10-01 DIAGNOSIS — I10 Essential (primary) hypertension: Secondary | ICD-10-CM

## 2019-10-01 DIAGNOSIS — Z720 Tobacco use: Secondary | ICD-10-CM

## 2019-10-01 SURGERY — ABDOMINAL AORTOGRAM W/LOWER EXTREMITY
Anesthesia: LOCAL | Laterality: Bilateral

## 2019-10-01 MED ORDER — VARENICLINE TARTRATE 1 MG PO TABS: 1.0000 mg | ORAL_TABLET | Freq: Two times a day (BID) | ORAL | 2 refills | Status: DC

## 2019-10-01 NOTE — Progress Notes (Signed)
[name] as a scribe for [name], FNP.,have documented all relevant documentation on the behalf of [name], FNP,as directed by  [name], FNP while in the presence of [name], [place].  This visit occurred during the SARS-CoV-2 public health emergency.  Safety protocols were in place, including screening questions prior to the visit, additional usage of staff PPE, and extensive cleaning of exam room while observing appropriate contact time as indicated for disinfecting solutions.  Subjective:     Patient ID: Benjamin [name] , male    DOB: May 04, 1960 , 59 y.o.   MRN: [id number]   Chief Complaint  Patient presents with  . Leg Pain    patient has had no improvement with his leg pain since last visit   . Foot Pain    patient stated he has a cut on his foot and it has been bothering him    HPI  His foot pain has improved but is worse at night. Especially after working standing for long hours.  He is taking gabapentin which has helped.  Reports having a callous to his left foot. Smokes cigarettes since age of 63 of 1 PPD.    He has been to vascular - he is to have a stent in his leg he was scheduled for August 10. He called and rescheduled for September.  He continues to have an open area to the top of his right great toe.   Leg Pain  The incident occurred more than 1 week ago. There was no injury mechanism. Pain location: left dorsal foot at toe. The patient is experiencing no pain. Pertinent negatives include no inability to bear weight. Treatments tried: pain cream.     Past Medical History:  Diagnosis Date  . Back pain   . Heavy smoker   . Hyperlipidemia LDL goal <70   . Peripheral arterial occlusive disease ([place]) 06/2013   Bilateral femoral artery disease     Family History  Problem Relation Age of Onset  . Arthritis Mother   . COPD Father   . Stroke Father   . Multiple sclerosis Sister      Current Outpatient Medications:  .  acetaminophen  (TYLENOL) 500 MG tablet, Take 500 mg by mouth every 6 (six) hours as needed for mild pain or headache., Disp: , Rfl:  .  acetaminophen-codeine (TYLENOL #3) 300-30 MG tablet, Take 1 tablet by mouth every 6 (six) hours as needed for moderate pain or severe pain., Disp: 10 tablet, Rfl: 0 .  amLODipine (NORVASC) 5 MG tablet, Take 1 tablet (5 mg total) by mouth daily., Disp: 30 tablet, Rfl: 0 .  atorvastatin (LIPITOR) 20 MG tablet, Take 1 tablet (20 mg total) by mouth daily., Disp: 30 tablet, Rfl: 3 .  cilostazol (PLETAL) 100 MG tablet, Take 1 tablet (100 mg total) by mouth 2 (two) times daily before a meal., Disp: 60 tablet, Rfl: 1 .  collagenase (SANTYL) ointment, Apply 1 application topically daily. Apply ointment the thickness of a nickel to the wound daily with a saline moistened gauze wrap and then dry gauze and gauze wrap, Disp: 15 g, Rfl: 0 .  gabapentin (NEURONTIN) 100 MG capsule, TAKE 1 CAPSULE(100 MG) BY MOUTH THREE TIMES DAILY (Patient taking differently: Take 100 mg by mouth daily as needed (pain). ), Disp: 90 capsule, Rfl: 2 .  Glucosamine 500 MG CAPS, Take 500 mg by mouth daily., Disp: , Rfl:  .  Magnesium 200 MG TABS, Take 1 tablet by mouth  daily with evening meal, Disp: 30 tablet, Rfl: 2 .  Multiple Vitamins-Minerals (DAILY-VITE WEIGHT CONTROL PO), Take 2 tablets by mouth daily., Disp: , Rfl:  .  Multiple Vitamins-Minerals (MULTIVITAMIN WITH MINERALS) tablet, Take 1 tablet by mouth 2 (two) times a week., Disp: , Rfl:  .  Vitamin D, Ergocalciferol, (DRISDOL) 1.25 MG (50000 UNIT) CAPS capsule, Take 1 capsule (50,000 Units total) by mouth 2 (two) times a week., Disp: 24 capsule, Rfl: 1 .  varenicline (CHANTIX CONTINUING MONTH PAK) 1 MG tablet, Take 1 tablet (1 mg total) by mouth 2 (two) times daily., Disp: 60 tablet, Rfl: 2   Allergies  Allergen Reactions  . Aspirin Nausea And Vomiting  . Garlic Other (See Comments)    sneezing     Review of Systems  Constitutional: Negative.  Negative  for fatigue.  HENT: Negative.   Respiratory: Negative.   Cardiovascular: Negative.  Negative for chest pain, palpitations and leg swelling.  Skin:       Open area to top of right great toe  Psychiatric/Behavioral: Negative.      Today's Vitals   10/01/19 0937  BP: (!) 160/98  Pulse: 83  Temp: (!) 97.4 F (36.3 C)  TempSrc: Oral  Weight: 146 lb (66.2 kg)  Height: 5' 7"  (1.702 m)  PainSc: 8   PainLoc: Foot   Body mass index is 22.87 kg/m.   Objective:  Physical Exam Vitals reviewed.  Constitutional:      Appearance: Normal appearance.  Cardiovascular:     Rate and Rhythm: Normal rate and regular rhythm.     Pulses: Normal pulses.     Heart sounds: Normal heart sounds. No murmur heard.   Pulmonary:     Effort: Pulmonary effort is normal. No respiratory distress.     Breath sounds: Normal breath sounds.  Skin:    Comments: Right great toe open vascular ulcer with hyperpigmented color surrounding.  Neurological:     General: No focal deficit present.     Mental Status: He is alert and oriented to person, place, and time.     Cranial Nerves: No cranial nerve deficit.  Psychiatric:        Mood and Affect: Mood normal.        Behavior: Behavior normal.     Comments: He repeats himself multiple times and I am not sure he truly understands what is going on with his health         Assessment And Plan:     1. Peripheral vascular disease ([place])  He has an open vascular ulcer to dorsal surface of great toe  Applied non stick dressing  I have advised him to contact vascular and get his appt scheduled ASAP, explained the disease process of his PVD and the risk he has to lose his toe/foot. Also attempted to call his sister and I was unable to leave a message - [id number]  2. Tobacco abuse  Encouraged to quit smoking this will help with his PVD - varenicline (CHANTIX CONTINUING MONTH PAK) 1 MG tablet; Take 1 tablet (1 mg total) by mouth 2 (two) times daily.  Dispense: 60  tablet; Refill: 2  3. Dyslipidemia, goal LDL below 70  Chronic, tolerating statin well - Lipid panel - CMP14+EGFR  4. Vitamin D deficiency  Will check vitamin D level and supplement as needed.     Also encouraged to spend 15 minutes in the sun daily.  - VITAMIN D 25 Hydroxy (Vit-D Deficiency, Fractures)  5. Essential hypertension  Elevated today   Advised to make sure he is taking his medication regularly and he is to quit smoking.       Patient was given opportunity to ask questions. Patient verbalized understanding of the plan and was able to repeat key elements of the plan. All questions were answered to their satisfaction.  [name], FNP   I, [name], FNP, have reviewed all documentation for this visit. The documentation on 10/02/19 for the exam, diagnosis, procedures, and orders are all accurate and complete.  THE PATIENT IS ENCOURAGED TO PRACTICE SOCIAL DISTANCING DUE TO THE COVID-19 PANDEMIC.

## 2019-10-01 NOTE — Patient Instructions (Signed)
Call to [facility] to reschedule your procedure ASAP.

## 2019-10-02 LAB — CMP14+EGFR
ALT: 19 IU/L (ref 0–44)
AST: 26 IU/L (ref 0–40)
Albumin/Globulin Ratio: 2.2 (ref 1.2–2.2)
Albumin: 4.3 g/dL (ref 3.8–4.9)
Alkaline Phosphatase: 113 IU/L (ref 48–121)
BUN/Creatinine Ratio: 13 (ref 10–24)
BUN: 11 mg/dL (ref 8–27)
Bilirubin Total: 0.3 mg/dL (ref 0.0–1.2)
CO2: 22 mmol/L (ref 20–29)
Calcium: 9.1 mg/dL (ref 8.6–10.2)
Chloride: 103 mmol/L (ref 96–106)
Creatinine, Ser: 0.82 mg/dL (ref 0.76–1.27)
GFR calc Af Amer: 111 mL/min/1.73
GFR calc non Af Amer: 96 mL/min/1.73
Globulin, Total: 2 g/dL (ref 1.5–4.5)
Glucose: 102 mg/dL — ABNORMAL HIGH (ref 65–99)
Potassium: 4.1 mmol/L (ref 3.5–5.2)
Sodium: 140 mmol/L (ref 134–144)
Total Protein: 6.3 g/dL (ref 6.0–8.5)

## 2019-10-02 LAB — LIPID PANEL
Chol/HDL Ratio: 2.5 ratio (ref 0.0–5.0)
Cholesterol, Total: 131 mg/dL (ref 100–199)
HDL: 52 mg/dL (ref >39)
LDL Chol Calc (NIH): 61 mg/dL (ref 0–99)
Triglycerides: 96 mg/dL (ref 0–149)
VLDL Cholesterol Cal: 18 mg/dL (ref 5–40)

## 2019-10-02 LAB — VITAMIN D 25 HYDROXY (VIT D DEFICIENCY, FRACTURES): Vit D, 25-Hydroxy: 24.8 ng/mL — ABNORMAL LOW (ref 30.0–100.0)

## 2019-10-04 ENCOUNTER — Telehealth: Payer: Self-pay

## 2019-10-04 NOTE — Telephone Encounter (Signed)
Patient called & left VM requesting details about his upcoming Surgery date. Message given to [facility] to contact patient regarding surgery info.

## 2019-10-07 ENCOUNTER — Telehealth: Payer: Self-pay | Admitting: Podiatry

## 2019-10-07 NOTE — Telephone Encounter (Signed)
Called pt back and he stated he already had gotten the information he needed and gotten everything straightened out.

## 2019-10-07 NOTE — Telephone Encounter (Signed)
I was scheduled for an appointment and I wanted to get some details about what it's all about. I wanted details on what I'm supposed to do.

## 2019-10-11 ENCOUNTER — Encounter (HOSPITAL_COMMUNITY): Payer: Managed Care, Other (non HMO)

## 2019-10-14 ENCOUNTER — Ambulatory Visit (HOSPITAL_COMMUNITY)
Admission: EM | Admit: 2019-10-14 | Discharge: 2019-10-14 | Disposition: A | Discharge status: Home / Self Care | Payer: Managed Care, Other (non HMO) | Attending: Family Medicine | Admitting: Family Medicine

## 2019-10-14 ENCOUNTER — Encounter (HOSPITAL_COMMUNITY): Payer: Self-pay

## 2019-10-14 ENCOUNTER — Telehealth: Payer: Self-pay | Admitting: Podiatry

## 2019-10-14 ENCOUNTER — Other Ambulatory Visit (HOSPITAL_COMMUNITY)
Admission: RE | Admit: 2019-10-14 | Discharge: 2019-10-14 | Disposition: A | Discharge status: Home / Self Care | Payer: Managed Care, Other (non HMO) | Source: Ambulatory Visit | Attending: Surgery | Admitting: Surgery

## 2019-10-14 ENCOUNTER — Other Ambulatory Visit: Payer: Self-pay

## 2019-10-14 DIAGNOSIS — I771 Stricture of artery: Secondary | ICD-10-CM

## 2019-10-14 DIAGNOSIS — Z01812 Encounter for preprocedural laboratory examination: Secondary | ICD-10-CM | POA: Insufficient documentation

## 2019-10-14 DIAGNOSIS — M79604 Pain in right leg: Secondary | ICD-10-CM

## 2019-10-14 DIAGNOSIS — Z20822 Contact with and (suspected) exposure to covid-19: Secondary | ICD-10-CM | POA: Insufficient documentation

## 2019-10-14 DIAGNOSIS — L98499 Non-pressure chronic ulcer of skin of other sites with unspecified severity: Secondary | ICD-10-CM

## 2019-10-14 LAB — SARS CORONAVIRUS 2 (TAT 6-24 HRS): SARS Coronavirus 2: NEGATIVE

## 2019-10-14 MED ORDER — HYDROCODONE-ACETAMINOPHEN 5-325 MG PO TABS: 1.0000 | ORAL_TABLET | Freq: Four times a day (QID) | ORAL | 0 refills | Status: DC | PRN

## 2019-10-14 NOTE — ED Notes (Signed)
I applied dry dressing to wound on pt's left foot.

## 2019-10-14 NOTE — Telephone Encounter (Signed)
PT would like a refill of Santyl.

## 2019-10-14 NOTE — ED Triage Notes (Signed)
Pt states he is scheduled for vascular surgery in the morning, states he needs pain medication to take in the morning before surgery.

## 2019-10-14 NOTE — ED Provider Notes (Signed)
[facility]    CSN: [id number] Arrival date & time: 10/14/19  1551      History   Chief Complaint Chief Complaint  Patient presents with  . Foot Pain    HPI Benjamin [name] is a 59 y.o. male.   HPI   Pleasant 56-year-old male who is being worked up for vascular insufficiency given a nonhealing ulcer on his right foot.  Right foot is swollen.  He has had recurring infections, swelling, and terrible pain in his foot.  He is here today requesting pain medication.  He is scheduled to have a stent put in tomorrow in order to alleviate his problem and to help his wound heal, but he states that he did not sleep last night is afraid he will not make it through the night tonight unless he has pain medication.  He has been a heavy smoker.  He has hyperlipidemia, hypertension and known peripheral arterial occlusive disease in the past.  Past Medical History:  Diagnosis Date  . Back pain   . Heavy smoker   . Hyperlipidemia LDL goal <70   . 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   Bilateral femoral artery disease    Patient Active Problem List   Diagnosis Date Noted  . Callus of foot 07/01/2019  . Intermittent claudication (HCC) 01/20/2017  . Need for influenza vaccination 01/20/2017  . Needs smoking cessation education 09/06/2016  . Abnormal echocardiogram 09/05/2016  . Abnormal EKG 07/25/2016  . Preoperative cardiovascular examination 07/25/2016  . Shortness of breath on exertion 07/25/2016  . Dyslipidemia, goal LDL below 70 07/25/2016  . High blood pressure 07/25/2016  . Abnormal chest x-ray 07/04/2016  . Peripheral vascular disease (HCC) 07/04/2016  . Decreased pedal pulses 06/24/2016  . Varicose vein of leg 06/24/2016  . Heavy smoker 06/24/2016  . Abnormal weight loss 06/24/2016  . Foot pain, left 06/24/2016  . Screening for prostate cancer 06/24/2016    Past Surgical History:  Procedure Laterality Date  . ABDOMINAL AORTOGRAM W/LOWER EXTREMITY N/A  07/12/2016   Procedure: Abdominal Aortogram w/Lower Extremity;  Surgeon: Nada Libman, MD;  Location: [facility];  Service: Cardiovascular;  Laterality: N/A;  Bilateral extermity: Patent Renal As. No sig Dz in infrarenal Abd Aorta. Normal Bilat Iliac arteries. R SFA is 100% @ origin - recon in AK-Pop A. R PT A patent. L CFA occluded. L PFA recon @ origin, L SFA occluded w/ recon in AK Pop A.  . COLONOSCOPY  06/2016   never  . NO PAST SURGERIES  06/2016       Home Medications    Prior to Admission medications   Medication Sig Start Date End Date Taking? Authorizing Provider  acetaminophen (TYLENOL) 500 MG tablet Take 500 mg by mouth every 6 (six) hours as needed for mild pain or headache.    [provider]  amLODipine (NORVASC) 5 MG tablet Take 1 tablet (5 mg total) by mouth daily. 04/21/18   Wurst, [place], PA-C  atorvastatin (LIPITOR) 20 MG tablet Take 1 tablet (20 mg total) by mouth daily. 09/26/19   Rhyne, [place], PA-C  cilostazol (PLETAL) 100 MG tablet Take 1 tablet (100 mg total) by mouth 2 (two) times daily before a meal. 09/26/19   Rhyne, Samantha J, PA-C  collagenase (SANTYL) ointment Apply 1 application topically daily. Apply ointment the thickness of a nickel to the wound daily with a saline moistened gauze wrap and then dry gauze and gauze wrap 09/17/19  McDonald, Adam R, DPM  gabapentin (NEURONTIN) 100 MG capsule TAKE 1 CAPSULE(100 MG) BY MOUTH THREE TIMES DAILY Patient taking differently: Take 100 mg by mouth daily as needed (pain).  08/16/19   Arnette Felts, FNP  Glucosamine 500 MG CAPS Take 500 mg by mouth daily.    [provider]  HYDROcodone-acetaminophen (NORCO/VICODIN) 5-325 MG tablet Take 1-2 tablets by mouth every 6 (six) hours as needed. 10/14/19   Eustace Moore, MD  Magnesium 200 MG TABS Take 1 tablet by mouth daily with evening meal 07/01/19   Arnette Felts, FNP  Multiple Vitamins-Minerals (DAILY-VITE WEIGHT CONTROL PO) Take 2 tablets  by mouth daily.    [provider]  Multiple Vitamins-Minerals (MULTIVITAMIN WITH MINERALS) tablet Take 1 tablet by mouth 2 (two) times a week.    [provider]  varenicline (CHANTIX CONTINUING MONTH PAK) 1 MG tablet Take 1 tablet (1 mg total) by mouth 2 (two) times daily. 10/01/19   Arnette Felts, FNP  Vitamin D, Ergocalciferol, (DRISDOL) 1.25 MG (50000 UNIT) CAPS capsule Take 1 capsule (50,000 Units total) by mouth 2 (two) times a week. 05/30/19   Arnette Felts, FNP    Family History Family History  Problem Relation Age of Onset  . Arthritis Mother   . COPD Father   . Stroke Father   . Multiple sclerosis Sister     Social History Social History   Tobacco Use  . Smoking status: Current Every Day Smoker    Packs/day: 1.50  . Smokeless tobacco: Never Used  . Tobacco comment: At least 20 years  Vaping Use  . Vaping Use: Never used  Substance Use Topics  . Alcohol use: No  . Drug use: No     Allergies   Aspirin and Garlic   Review of Systems Review of Systems See HPI  Physical Exam Triage Vital Signs ED Triage Vitals  Enc Vitals Group     BP 10/14/19 1654 (!) 128/101     Pulse Rate 10/14/19 1654 81     Resp 10/14/19 1654 16     Temp 10/14/19 1654 98.2 F (36.8 C)     Temp src --      SpO2 10/14/19 1654 99 %     Weight --      Height --      Head Circumference --      Peak Flow --      Pain Score 10/14/19 1655 9     Pain Loc --      Pain Edu? --      Excl. in GC? --    No data found.  Updated Vital Signs BP (!) 128/101   Pulse 81   Temp 98.2 F (36.8 C)   Resp 16   SpO2 99%      Physical Exam Constitutional:      General: He is not in acute distress.    Appearance: Normal appearance. He is well-developed and normal weight.  HENT:     Head: Normocephalic and atraumatic.  Eyes:     Conjunctiva/sclera: Conjunctivae normal.     Pupils: Pupils are equal, round, and reactive to light.  Cardiovascular:     Rate and Rhythm: Normal  rate.  Pulmonary:     Effort: Pulmonary effort is normal. No respiratory distress.  Abdominal:     General: There is no distension.     Palpations: Abdomen is soft.  Musculoskeletal:        General: Normal range of motion.  Cervical back: Normal range of motion.  Skin:    General: Skin is warm and dry.     Comments: Circular ulcer on the dorsum of the right foot over the first MTP.  Soft tissue swelling from the ankle down to the toes.  Minimal redness.  Tender to touch.  Neurological:     Mental Status: He is alert.     Gait: Gait abnormal.     Comments: Mild antalgic gait      UC Treatments / Results  Labs (all labs ordered are listed, but only abnormal results are displayed) Labs Reviewed - No data to display  EKG   Radiology No results found.  Procedures Procedures (including critical care time)  Medications Ordered in UC Medications - No data to display  Initial Impression / Assessment and Plan / UC Course  I have reviewed the triage vital signs and the nursing notes.  Pertinent labs & imaging results that were available during my care of the patient were reviewed by me and considered in my medical decision making (see chart for details).     A limited number of pain pills were given to the patient.  His [employer] inquiry revealed no prior narcotic use. Final Clinical Impressions(s) / UC Diagnoses   Final diagnoses:  Pain of right upper extremity  Arterial insufficiency with ischemic ulcer [facility])     Discharge Instructions     Leave dressing on until tomorrow Go home and elevate your leg Take pain medicine as needed for severe pain Do not take pain medicine and drive   ED Prescriptions    Medication Sig Dispense Auth. Provider   HYDROcodone-acetaminophen (NORCO/VICODIN) 5-325 MG tablet Take 1-2 tablets by mouth every 6 (six) hours as needed. 10 tablet Eustace Moore, MD     I have reviewed the PDMP during this encounter.    Eustace Moore, MD 10/14/19 [phone]

## 2019-10-14 NOTE — Discharge Instructions (Signed)
Leave dressing on until tomorrow Go home and elevate your leg Take pain medicine as needed for severe pain Do not take pain medicine and drive

## 2019-10-14 NOTE — Telephone Encounter (Signed)
He is seeing Dr. Lilian Kapur.

## 2019-10-15 ENCOUNTER — Ambulatory Visit (HOSPITAL_COMMUNITY)
Admission: RE | Admit: 2019-10-15 | Discharge: 2019-10-15 | Disposition: A | Discharge status: Home / Self Care | Payer: Managed Care, Other (non HMO) | Attending: Surgery | Admitting: Surgery

## 2019-10-15 ENCOUNTER — Other Ambulatory Visit (HOSPITAL_COMMUNITY): Payer: Managed Care, Other (non HMO)

## 2019-10-15 ENCOUNTER — Ambulatory Visit: Payer: Managed Care, Other (non HMO)

## 2019-10-15 ENCOUNTER — Encounter (HOSPITAL_COMMUNITY)
Admission: RE | Disposition: A | Discharge status: Home / Self Care | Payer: Self-pay | Source: Home / Self Care | Attending: Surgery

## 2019-10-15 DIAGNOSIS — E785 Hyperlipidemia, unspecified: Secondary | ICD-10-CM | POA: Diagnosis not present

## 2019-10-15 DIAGNOSIS — L97529 Non-pressure chronic ulcer of other part of left foot with unspecified severity: Secondary | ICD-10-CM | POA: Insufficient documentation

## 2019-10-15 DIAGNOSIS — F17210 Nicotine dependence, cigarettes, uncomplicated: Secondary | ICD-10-CM | POA: Insufficient documentation

## 2019-10-15 DIAGNOSIS — Z79899 Other long term (current) drug therapy: Secondary | ICD-10-CM | POA: Insufficient documentation

## 2019-10-15 DIAGNOSIS — I70245 Atherosclerosis of native arteries of left leg with ulceration of other part of foot: Secondary | ICD-10-CM | POA: Diagnosis present

## 2019-10-15 DIAGNOSIS — Z886 Allergy status to analgesic agent status: Secondary | ICD-10-CM | POA: Insufficient documentation

## 2019-10-15 HISTORY — PX: ABDOMINAL AORTOGRAM W/LOWER EXTREMITY: CATH118223

## 2019-10-15 HISTORY — PX: PERIPHERAL VASCULAR INTERVENTION: CATH118257

## 2019-10-15 LAB — POCT I-STAT, CHEM 8
BUN: 16 mg/dL (ref 6–20)
Calcium, Ion: 1.34 mmol/L (ref 1.15–1.40)
Chloride: 103 mmol/L (ref 98–111)
Creatinine, Ser: 0.9 mg/dL (ref 0.61–1.24)
Glucose, Bld: 96 mg/dL (ref 70–99)
HCT: 41 % (ref 39.0–52.0)
Hemoglobin: 13.9 g/dL (ref 13.0–17.0)
Potassium: 4.2 mmol/L (ref 3.5–5.1)
Sodium: 140 mmol/L (ref 135–145)
TCO2: 25 mmol/L (ref 22–32)

## 2019-10-15 LAB — POCT ACTIVATED CLOTTING TIME
Activated Clotting Time: 219 seconds
Activated Clotting Time: 180 seconds

## 2019-10-15 SURGERY — ABDOMINAL AORTOGRAM W/LOWER EXTREMITY
Anesthesia: LOCAL

## 2019-10-15 MED ORDER — HYDRALAZINE HCL 20 MG/ML IJ SOLN
INTRAMUSCULAR | Status: DC | PRN
  Administered 2019-10-15 (×2): 10 mg via INTRAVENOUS

## 2019-10-15 MED ORDER — SODIUM CHLORIDE 0.9% FLUSH: 3.0000 mL | Freq: Two times a day (BID) | INTRAVENOUS | Status: DC

## 2019-10-15 MED ORDER — HEPARIN SODIUM (PORCINE) 1000 UNIT/ML IJ SOLN
INTRAMUSCULAR | Status: DC | PRN
  Administered 2019-10-15: 7000 [IU] via INTRAVENOUS

## 2019-10-15 MED ORDER — ONDANSETRON HCL 4 MG/2ML IJ SOLN: 4.0000 mg | Freq: Four times a day (QID) | INTRAMUSCULAR | Status: DC | PRN

## 2019-10-15 MED ORDER — LABETALOL HCL 5 MG/ML IV SOLN: 10.0000 mg | INTRAVENOUS | Status: DC | PRN

## 2019-10-15 MED ORDER — ACETAMINOPHEN 325 MG PO TABS: 650.0000 mg | ORAL_TABLET | ORAL | Status: DC | PRN

## 2019-10-15 MED ORDER — SODIUM CHLORIDE 0.9 % IV SOLN: INTRAVENOUS | Status: DC

## 2019-10-15 MED ORDER — HYDRALAZINE HCL 20 MG/ML IJ SOLN
INTRAMUSCULAR | Status: AC
  Filled 2019-10-15: qty 1

## 2019-10-15 MED ORDER — HEPARIN SODIUM (PORCINE) 1000 UNIT/ML IJ SOLN
INTRAMUSCULAR | Status: AC
  Filled 2019-10-15: qty 1

## 2019-10-15 MED ORDER — IODIXANOL 320 MG/ML IV SOLN
INTRAVENOUS | Status: DC | PRN
  Administered 2019-10-15: 180 mL

## 2019-10-15 MED ORDER — LIDOCAINE HCL (PF) 1 % IJ SOLN
INTRAMUSCULAR | Status: AC
  Filled 2019-10-15: qty 30

## 2019-10-15 MED ORDER — FENTANYL CITRATE (PF) 100 MCG/2ML IJ SOLN
INTRAMUSCULAR | Status: AC
  Filled 2019-10-15: qty 2

## 2019-10-15 MED ORDER — HEPARIN (PORCINE) IN NACL 1000-0.9 UT/500ML-% IV SOLN
INTRAVENOUS | Status: DC | PRN
  Administered 2019-10-15 (×2): 500 mL

## 2019-10-15 MED ORDER — MIDAZOLAM HCL 2 MG/2ML IJ SOLN
INTRAMUSCULAR | Status: AC
  Filled 2019-10-15: qty 2

## 2019-10-15 MED ORDER — MIDAZOLAM HCL 2 MG/2ML IJ SOLN
INTRAMUSCULAR | Status: DC | PRN
  Administered 2019-10-15 (×2): 1 mg via INTRAVENOUS

## 2019-10-15 MED ORDER — LIDOCAINE HCL (PF) 1 % IJ SOLN
INTRAMUSCULAR | Status: DC | PRN
  Administered 2019-10-15: 18 mL via INTRADERMAL

## 2019-10-15 MED ORDER — OXYCODONE HCL 5 MG PO TABS
5.0000 mg | ORAL_TABLET | ORAL | Status: DC | PRN
  Administered 2019-10-15: 5 mg via ORAL
  Filled 2019-10-15: qty 1

## 2019-10-15 MED ORDER — SODIUM CHLORIDE 0.9 % WEIGHT BASED INFUSION: 1.0000 mL/kg/h | INTRAVENOUS | Status: DC

## 2019-10-15 MED ORDER — HYDRALAZINE HCL 20 MG/ML IJ SOLN: 5.0000 mg | INTRAMUSCULAR | Status: DC | PRN

## 2019-10-15 MED ORDER — HEPARIN (PORCINE) IN NACL 1000-0.9 UT/500ML-% IV SOLN
INTRAVENOUS | Status: AC
  Filled 2019-10-15: qty 1000

## 2019-10-15 MED ORDER — MORPHINE SULFATE (PF) 2 MG/ML IV SOLN: 2.0000 mg | INTRAVENOUS | Status: DC | PRN

## 2019-10-15 MED ORDER — SODIUM CHLORIDE 0.9 % IV SOLN: 250.0000 mL | INTRAVENOUS | Status: DC | PRN

## 2019-10-15 MED ORDER — SODIUM CHLORIDE 0.9% FLUSH: 3.0000 mL | INTRAVENOUS | Status: DC | PRN

## 2019-10-15 MED ORDER — FENTANYL CITRATE (PF) 100 MCG/2ML IJ SOLN
INTRAMUSCULAR | Status: DC | PRN
Start: 2019-10-15 — End: 2019-10-15
  Administered 2019-10-15: 25 ug via INTRAVENOUS
  Administered 2019-10-15: 50 ug via INTRAVENOUS

## 2019-10-15 SURGICAL SUPPLY — 20 items
BALLN MUSTANG 9X80X75 (BALLOONS) ×3
BALLOON MUSTANG 9X80X75 (BALLOONS) ×2 IMPLANT
CATH ANGIO 5F BER2 65CM (CATHETERS) ×3 IMPLANT
CATH OMNI FLUSH 5F 65CM (CATHETERS) ×3 IMPLANT
DEVICE CONTINUOUS FLUSH (MISCELLANEOUS) ×3 IMPLANT
KIT ENCORE 26 ADVANTAGE (KITS) ×3 IMPLANT
KIT MICROPUNCTURE NIT STIFF (SHEATH) ×3 IMPLANT
KIT PV (KITS) ×3 IMPLANT
SHEATH BRITE TIP 6FR 35CM (SHEATH) ×3 IMPLANT
SHEATH PINNACLE 5F 10CM (SHEATH) ×3 IMPLANT
SHEATH PINNACLE 6F 10CM (SHEATH) ×3 IMPLANT
SHEATH PROBE COVER 6X72 (BAG) ×3 IMPLANT
STENT EPIC 10X100X75 (Permanent Stent) ×3 IMPLANT
SYR CONTROL 10ML ANGIOGRAPHIC (SYRINGE) ×3 IMPLANT
SYR MEDRAD MARK V 150ML (SYRINGE) ×3 IMPLANT
TRANSDUCER W/STOPCOCK (MISCELLANEOUS) ×3 IMPLANT
TRAY PV CATH (CUSTOM PROCEDURE TRAY) ×3 IMPLANT
WIRE BENTSON .035X145CM (WIRE) ×6 IMPLANT
WIRE HITORQ VERSACORE ST 145CM (WIRE) ×3 IMPLANT
WIRE VERSACORE LOC 115CM (WIRE) ×3 IMPLANT

## 2019-10-15 NOTE — Discharge Instructions (Signed)
 DRINK PLENTY OF FLUIDS OVER THE NEXT 2-3 DAYS.Femoral Site Care This sheet gives you information about how to care for yourself after your procedure. Your health care provider may also give you more specific instructions. If you have problems or questions, contact your health care provider. What can I expect after the procedure? After the procedure, it is common to have:  Bruising that usually fades within 1-2 weeks.  Tenderness at the site. Follow these instructions at home: Wound care  Follow instructions from your health care provider about how to take care of your insertion site. Make sure you: ? Wash your hands with soap and water before you change your bandage (dressing). If soap and water are not available, use hand sanitizer. ? Change your dressing as told by your health care provider. ? Leave stitches (sutures), skin glue, or adhesive strips in place. These skin closures may need to stay in place for 2 weeks or longer. If adhesive strip edges start to loosen and curl up, you may trim the loose edges. Do not remove adhesive strips completely unless your health care provider tells you to do that.  Do not take baths, swim, or use a hot tub until your health care provider approves.  You may shower 24-48 hours after the procedure or as told by your health care provider. ? Gently wash the site with plain soap and water. ? Pat the area dry with a clean towel. ? Do not rub the site. This may cause bleeding.  Do not apply powder or lotion to the site. Keep the site clean and dry.  Check your femoral site every day for signs of infection. Check for: ? Redness, swelling, or pain. ? Fluid or blood. ? Warmth. ? Pus or a bad smell. Activity  For the first 2-3 days after your procedure, or as long as directed: ? Avoid climbing stairs as much as possible. ? Do not squat.  Do not lift anything that is heavier than 10 lb (4.5 kg), or the limit that you are told, until your health care  provider says that it is safe.  Rest as directed. ? Avoid sitting for a long time without moving. Get up to take short walks every 1-2 hours.  Do not drive for 24 hours if you were given a medicine to help you relax (sedative). General instructions  Take over-the-counter and prescription medicines only as told by your health care provider.  Keep all follow-up visits as told by your health care provider. This is important. Contact a health care provider if you have:  A fever or chills.  You have redness, swelling, or pain around your insertion site. Get help right away if:  The catheter insertion area swells very fast.  You pass out.  You suddenly start to sweat or your skin gets clammy.  The catheter insertion area is bleeding, and the bleeding does not stop when you hold steady pressure on the area.  The area near or just beyond the catheter insertion site becomes pale, cool, tingly, or numb. These symptoms may represent a serious problem that is an emergency. Do not wait to see if the symptoms will go away. Get medical help right away. Call your local emergency services (911 in the [place] Do not drive yourself to the hospital. Summary  After the procedure, it is common to have bruising that usually fades within 1-2 weeks.  Check your femoral site every day for signs of infection.  Do not lift anything that   is heavier than 10 lb (4.5 kg), or the limit that you are told, until your health care provider says that it is safe. This information is not intended to replace advice given to you by your health care provider. Make sure you discuss any questions you have with your health care provider. Document Revised: 02/20/2017 Document Reviewed: 02/20/2017 Elsevier Patient Education  2020 Elsevier Inc.  

## 2019-10-15 NOTE — Op Note (Signed)
Patient name: Benjamin [name] MRN: [id number] DOB: 1960-09-04 Sex: male  10/15/2019 Pre-operative Diagnosis: Left toe ulcer Post-operative diagnosis:  Same Surgeon:  [name] Procedure Performed:  1. Ultrasound-guided access, right femoral artery  2. Abdominal aortogram  3. Bilateral lower extremity runoff  4. Stent, right common iliac artery  5. Stent, left common iliac artery  6. Conscious sedation, 76 minutes    Indications: The patient has a nonhealing wound to his left great toe. He comes in today for angiographic evaluation  Procedure:  The patient was identified in the holding area and taken to room 8.  The patient was then placed supine on the table and prepped and draped in the usual sterile fashion.  A time out was called.  Conscious sedation was administered with the use of IV fentanyl and Versed under continuous physician and nurse monitoring.  Heart rate, blood pressure, and oxygen saturation were continuously monitored.  Total sedation time was .  Ultrasound was used to evaluate the right common femoral artery.  It was patent .  A digital ultrasound image was acquired.  A micropuncture needle was used to access the right common femoral artery under ultrasound guidance.  An 018 wire was advanced without resistance and a micropuncture sheath was placed.  The 018 wire was removed and a benson wire was placed.  The micropuncture sheath was exchanged for a 5 french sheath.  An omniflush catheter was advanced over the wire to the level of L-1.  An abdominal angiogram was obtained. Next the cath was pulled out of the aortic bifurcation and bilateral off was performed. Findings:   Aortogram: No significant renal artery stenosis is identified. The left common and external iliac artery are occluded. The right common iliac artery is heavily calcified but patent. At its distal extent at the level of the hypogastric artery there is a greater than 80% stenosis. Calcification and  narrowing persist into the external iliac artery which does become relatively of normal caliber at the femoral head:  Right Lower Extremity: The right common femoral and profundofemoral artery are widely patent. The superficial femoral artery is occluded. There is reconstitution of the above-knee popliteal artery at the level of the patella. The below-knee popliteal artery is patent throughout its course. There is single-vessel runoff via the peroneal artery. There is reconstitution of the posterior tibial artery that does appear to cross the ankle.  Left Lower Extremity: The left distal common femoral artery reconstitutes from proximal collaterals. The superficial femoral artery is occluded at its origin. There is reconstitution of the below-knee popliteal artery with single-vessel runoff via the peroneal artery.  Intervention: After the above images were acquired the decision made to proceed with intervention. A 6 French Brite tip sheath was inserted into the aorta under fluoroscopic visualization and the patient was fully heparinized. I selected a 10 x 100 epic self-expanding stent and deployed this in the proximal common iliac artery extending into the external iliac artery. This was postdilated with a 9 mm balloon. Follow-up imaging showed resolution of the stenosis.  Impression:  #1 greater than 80% right iliac stenosis treated using a 10 x 100 epic stent within the common and external iliac artery with no residual stenosis  #2 occlusion of the left common external iliac artery with reconstitution of the distal common femoral artery. The superficial femoral artery is occluded and reconstitutes at the joint space with single-vessel runoff via the peroneal artery  #3 will be evaluated for retrograde left iliac stenting  versus a right to left femoral-femoral bypass graft with a left femoral to below-knee popliteal artery bypass graft    V. [name], M.D., [facility]  [place] Office: [phone] Pager:  [phone]

## 2019-10-15 NOTE — Interval H&P Note (Signed)
History and Physical Interval Note:  10/15/2019 10:27 AM  Benjamin [name]  has presented today for surgery, with the diagnosis of ischemia.  The various methods of treatment have been discussed with the patient and family. After consideration of risks, benefits and other options for treatment, the patient has consented to  Procedure(s): ABDOMINAL AORTOGRAM W/LOWER EXTREMITY (N/A) as a surgical intervention.  The patient's history has been reviewed, patient examined, no change in status, stable for surgery.  I have reviewed the patient's chart and labs.  Questions were answered to the patient's satisfaction.     [name]

## 2019-10-15 NOTE — Progress Notes (Signed)
Order for sheath removal verified per post procedural orders. Procedure explained to patient and right femoral artery access site assessed: level 0, palpable dorsalis pedis and posterior tibial pulses. 6 French Sheath removed and manual pressure applied for 20 minutes. Pre, peri, & post procedural vitals: HR 70, RR 12 O2 Sat 98%, BP 164/88, Pain level 0. Distal pulses remained intact after sheath removal. Access site level 0 and dressed with 4X4 gauze and tegaderm. RN confirmed condition of site. Post procedural instructions discussed with return demonstration from patient.

## 2019-10-16 ENCOUNTER — Encounter (HOSPITAL_COMMUNITY): Payer: Self-pay | Admitting: Surgery

## 2019-10-17 ENCOUNTER — Encounter (HOSPITAL_COMMUNITY): Payer: Self-pay | Admitting: Surgery

## 2019-10-21 ENCOUNTER — Ambulatory Visit (INDEPENDENT_AMBULATORY_CARE_PROVIDER_SITE_OTHER): Payer: Managed Care, Other (non HMO) | Admitting: Physician Assistant

## 2019-10-21 ENCOUNTER — Encounter: Payer: Self-pay | Admitting: Surgery

## 2019-10-21 ENCOUNTER — Encounter: Payer: Self-pay | Admitting: Physician Assistant

## 2019-10-21 ENCOUNTER — Other Ambulatory Visit: Payer: Self-pay

## 2019-10-21 VITALS — BP 155/100 | HR 91 | Temp 98.6°F | Resp 20 | Ht 67.0 in | Wt 145.3 lb

## 2019-10-21 DIAGNOSIS — I739 Peripheral vascular disease, unspecified: Secondary | ICD-10-CM

## 2019-10-21 DIAGNOSIS — I998 Other disorder of circulatory system: Secondary | ICD-10-CM

## 2019-10-21 DIAGNOSIS — I70229 Atherosclerosis of native arteries of extremities with rest pain, unspecified extremity: Secondary | ICD-10-CM

## 2019-10-21 NOTE — H&P (View-Only) (Signed)
CC:  F/u to plan for next surgical intervention  HPI:  This is a 59 y.o. male left LE pain started in June of 2018.  He was given  a walking program and started him on Cilatazol, a Statin and ASA daily  He stopped the medication and now has an allergy to ASA.  He continues to smoke daily.    He returned with increased left LE pain with symptoms of claudication less than a city block with base of GT non healing ulcer.              He is s/p angiogram with B common iliac stent placements on 10/15/19 by Dr. Myra Gianotti.  He is planning right to left fem-fem bypass and left fem-tibial bypass in the near future.         Pt returns today with complaints of left LE swelling and rest pain.  He has to seat up at night to relieve the pain in a dependent position.      Allergies  Allergen Reactions  . Aspirin Nausea And Vomiting  . Garlic Other (See Comments)    sneezing    Current Outpatient Medications  Medication Sig Dispense Refill  . acetaminophen (TYLENOL) 500 MG tablet Take 500 mg by mouth every 6 (six) hours as needed for mild pain or headache.    Marland Kitchen amLODipine (NORVASC) 5 MG tablet Take 1 tablet (5 mg total) by mouth daily. 30 tablet 0  . atorvastatin (LIPITOR) 20 MG tablet Take 1 tablet (20 mg total) by mouth daily. 30 tablet 3  . cilostazol (PLETAL) 100 MG tablet Take 1 tablet (100 mg total) by mouth 2 (two) times daily before a meal. 60 tablet 1  . collagenase (SANTYL) ointment Apply 1 application topically daily. Apply ointment the thickness of a nickel to the wound daily with a saline moistened gauze wrap and then dry gauze and gauze wrap 15 g 0  . gabapentin (NEURONTIN) 100 MG capsule TAKE 1 CAPSULE(100 MG) BY MOUTH THREE TIMES DAILY (Patient taking differently: Take 100 mg by mouth daily as needed (pain). ) 90 capsule 2  . Glucosamine 500 MG CAPS Take 500 mg by mouth daily.    Marland Kitchen HYDROcodone-acetaminophen (NORCO/VICODIN) 5-325 MG tablet Take 1-2 tablets by mouth every 6 (six) hours  as needed. 10 tablet 0  . Magnesium 200 MG TABS Take 1 tablet by mouth daily with evening meal 30 tablet 2  . Multiple Vitamins-Minerals (DAILY-VITE WEIGHT CONTROL PO) Take 2 tablets by mouth daily.    . Multiple Vitamins-Minerals (MULTIVITAMIN WITH MINERALS) tablet Take 1 tablet by mouth 2 (two) times a week.    . varenicline (CHANTIX CONTINUING MONTH PAK) 1 MG tablet Take 1 tablet (1 mg total) by mouth 2 (two) times daily. 60 tablet 2  . Vitamin D, Ergocalciferol, (DRISDOL) 1.25 MG (50000 UNIT) CAPS capsule Take 1 capsule (50,000 Units total) by mouth 2 (two) times a week. 24 capsule 1   No current facility-administered medications for this visit.     ROS:  See HPI  Physical Exam:    Incision:  Right groin soft Extremities:   Peroneal doppler signal left LE, moderate edema in the lower leg and foot.  Yellow eschar over base of GT wound.   Non healing left GT wound Lungs: non labored breathing Abdomen:  Soft NTTP Heart RRR  Assessment/Plan:   Symptomatic PAD with occluded left iliac and left SFA  This is a 59 y.o. male who is  s/p: right common iliac stent to improve arterial flow.  He will be scheduled for right to left fem-fem bypass and left fem -to popliteal bypass with GSV harvest verse PTFE by Dr. Brabham.       Witney Huie Maureen Avaree Gilberti PA-C [facility] [phone]  Clinic MD:  Brabham 

## 2019-10-21 NOTE — Progress Notes (Signed)
CC:  F/u to plan for next surgical intervention  HPI:  This is a 59 y.o. male left LE pain started in June of 2018.  He was given  a walking program and started him on Cilatazol, a Statin and ASA daily  He stopped the medication and now has an allergy to ASA.  He continues to smoke daily.    He returned with increased left LE pain with symptoms of claudication less than a city block with base of GT non healing ulcer.              He is s/p angiogram with B common iliac stent placements on 10/15/19 by Dr. Myra Gianotti.  He is planning right to left fem-fem bypass and left fem-tibial bypass in the near future.         Pt returns today with complaints of left LE swelling and rest pain.  He has to seat up at night to relieve the pain in a dependent position.      Allergies  Allergen Reactions  . Aspirin Nausea And Vomiting  . Garlic Other (See Comments)    sneezing    Current Outpatient Medications  Medication Sig Dispense Refill  . acetaminophen (TYLENOL) 500 MG tablet Take 500 mg by mouth every 6 (six) hours as needed for mild pain or headache.    Marland Kitchen amLODipine (NORVASC) 5 MG tablet Take 1 tablet (5 mg total) by mouth daily. 30 tablet 0  . atorvastatin (LIPITOR) 20 MG tablet Take 1 tablet (20 mg total) by mouth daily. 30 tablet 3  . cilostazol (PLETAL) 100 MG tablet Take 1 tablet (100 mg total) by mouth 2 (two) times daily before a meal. 60 tablet 1  . collagenase (SANTYL) ointment Apply 1 application topically daily. Apply ointment the thickness of a nickel to the wound daily with a saline moistened gauze wrap and then dry gauze and gauze wrap 15 g 0  . gabapentin (NEURONTIN) 100 MG capsule TAKE 1 CAPSULE(100 MG) BY MOUTH THREE TIMES DAILY (Patient taking differently: Take 100 mg by mouth daily as needed (pain). ) 90 capsule 2  . Glucosamine 500 MG CAPS Take 500 mg by mouth daily.    Marland Kitchen HYDROcodone-acetaminophen (NORCO/VICODIN) 5-325 MG tablet Take 1-2 tablets by mouth every 6 (six) hours  as needed. 10 tablet 0  . Magnesium 200 MG TABS Take 1 tablet by mouth daily with evening meal 30 tablet 2  . Multiple Vitamins-Minerals (DAILY-VITE WEIGHT CONTROL PO) Take 2 tablets by mouth daily.    . Multiple Vitamins-Minerals (MULTIVITAMIN WITH MINERALS) tablet Take 1 tablet by mouth 2 (two) times a week.    . varenicline (CHANTIX CONTINUING MONTH PAK) 1 MG tablet Take 1 tablet (1 mg total) by mouth 2 (two) times daily. 60 tablet 2  . Vitamin D, Ergocalciferol, (DRISDOL) 1.25 MG (50000 UNIT) CAPS capsule Take 1 capsule (50,000 Units total) by mouth 2 (two) times a week. 24 capsule 1   No current facility-administered medications for this visit.     ROS:  See HPI  Physical Exam:    Incision:  Right groin soft Extremities:   Peroneal doppler signal left LE, moderate edema in the lower leg and foot.  Yellow eschar over base of GT wound.   Non healing left GT wound Lungs: non labored breathing Abdomen:  Soft NTTP Heart RRR  Assessment/Plan:   Symptomatic PAD with occluded left iliac and left SFA  This is a 59 y.o. male who is  s/p: right common iliac stent to improve arterial flow.  He will be scheduled for right to left fem-fem bypass and left fem -to popliteal bypass with GSV harvest verse PTFE by Dr. Myra Gianotti.       Mosetta Pigeon PA-C [facility] [phone]  Clinic MD:  Myra Gianotti

## 2019-10-22 NOTE — Progress Notes (Signed)
[id number] Ginette Otto,  - [address] AT [facility] OF [address] [place] [address] Phone: [phone] Fax: [phone]      Your procedure is scheduled on Friday 10/25/2019.  Report to [facility] Main Entrance "A" at 05:30 A.M., and check in at the Admitting office.  Call this number if you have problems the morning of surgery:  [phone]  Call [phone] if you have any questions prior to your surgery date Monday-Friday 8am-4pm    Remember:  Do not eat after midnight the night before your surgery     Take these medicines the morning of surgery with A SIP OF WATER: Acetaminophen (Tylenol) - if needed Amlodipine (Norvasc) Atorvastatin (Lipitor) Gabapentin (Neurontin) - if needed Hydrocodone-acetaminophen (Norco/vicodin) - if needed varenicline (Chantix)  As of today, STOP taking any Aspirin (unless otherwise instructed by your surgeon) Aleve, Naproxen, Ibuprofen, Motrin, Advil, Goody's, BC's, all herbal medications, fish oil, and all vitamins.  Follow your surgeon's instructions on when to stop Cilostazol (Pletal).  If no instructions were given by your surgeon then you will need to call the office to get those instructions.                         Do not wear jewelry            Do not wear lotions, powders, colognes, or deodorant.            Men may shave face and neck.            Do not bring valuables to the hospital.            [facility] is not responsible for any belongings or valuables.  Do NOT Smoke (Tobacco/Vaping) or drink Alcohol 24 hours prior to your procedure  If you use a CPAP at night, you may bring all equipment for your overnight stay.   Contacts, glasses, dentures or bridgework may not be worn into surgery.      For patients admitted to the hospital, discharge time will be determined by your treatment team.   Patients discharged the day of surgery will not be allowed to drive  home, and someone needs to stay with them for 24 hours.    Special instructions:   [facility]- Preparing For Surgery  Before surgery, you can play an important role. Because skin is not sterile, your skin needs to be as free of germs as possible. You can reduce the number of germs on your skin by washing with CHG (chlorahexidine gluconate) Soap before surgery.  CHG is an antiseptic cleaner which kills germs and bonds with the skin to continue killing germs even after washing.    Oral Hygiene is also important to reduce your risk of infection.  Remember - BRUSH YOUR TEETH THE MORNING OF SURGERY WITH YOUR REGULAR TOOTHPASTE  Please do not use if you have an allergy to CHG or antibacterial soaps. If your skin becomes reddened/irritated stop using the CHG.  Do not shave (including legs and underarms) for at least 48 hours prior to first CHG shower. It is OK to shave your face.  Please follow these instructions carefully.   1. Shower the NIGHT BEFORE SURGERY and the MORNING OF SURGERY with CHG Soap.   2. If you chose to wash your hair, wash your hair first as usual with your normal shampoo.  3. After you shampoo, rinse your hair  and body thoroughly to remove the shampoo.  4. Use CHG as you would any other liquid soap. You can apply CHG directly to the skin and wash gently with a scrungie or a clean washcloth.   5. Apply the CHG Soap to your body ONLY FROM THE NECK DOWN.  Do not use on open wounds or open sores. Avoid contact with your eyes, ears, mouth and genitals (private parts). Wash Face and genitals (private parts)  with your normal soap.   6. Wash thoroughly, paying special attention to the area where your surgery will be performed.  7. Thoroughly rinse your body with warm water from the neck down.  8. DO NOT shower/wash with your normal soap after using and rinsing off the CHG Soap.  9. Pat yourself dry with a CLEAN TOWEL.  10. Wear CLEAN PAJAMAS to bed the night before  surgery  11. Place CLEAN SHEETS on your bed the night of your first shower and DO NOT SLEEP WITH PETS.   Day of Surgery: Shower with CHG soap as directed Wear Clean/Comfortable clothing the morning of surgery Do not apply any deodorants/lotions.   Remember to brush your teeth WITH YOUR REGULAR TOOTHPASTE.   Please read over the following fact sheets that you were given.

## 2019-10-23 ENCOUNTER — Encounter (HOSPITAL_COMMUNITY): Payer: Self-pay | Admitting: Vascular Surgery

## 2019-10-23 ENCOUNTER — Other Ambulatory Visit: Payer: Self-pay

## 2019-10-23 ENCOUNTER — Encounter (HOSPITAL_COMMUNITY)
Admission: RE | Admit: 2019-10-23 | Discharge: 2019-10-23 | Disposition: A | Discharge status: Home / Self Care | Payer: Managed Care, Other (non HMO) | Source: Ambulatory Visit | Attending: Surgery | Admitting: Surgery

## 2019-10-23 ENCOUNTER — Encounter: Payer: Self-pay | Admitting: Nurse Practitioner

## 2019-10-23 ENCOUNTER — Encounter (HOSPITAL_COMMUNITY): Payer: Self-pay

## 2019-10-23 ENCOUNTER — Ambulatory Visit (INDEPENDENT_AMBULATORY_CARE_PROVIDER_SITE_OTHER): Payer: Managed Care, Other (non HMO) | Admitting: Nurse Practitioner

## 2019-10-23 VITALS — BP 142/98 | HR 94 | Temp 98.2°F | Ht 67.0 in | Wt 146.0 lb

## 2019-10-23 DIAGNOSIS — I10 Essential (primary) hypertension: Secondary | ICD-10-CM

## 2019-10-23 DIAGNOSIS — Z01818 Encounter for other preprocedural examination: Secondary | ICD-10-CM | POA: Diagnosis not present

## 2019-10-23 HISTORY — DX: Essential (primary) hypertension: I10

## 2019-10-23 LAB — URINALYSIS, ROUTINE W REFLEX MICROSCOPIC
Bilirubin Urine: NEGATIVE
Glucose, UA: NEGATIVE mg/dL
Hgb urine dipstick: NEGATIVE
Ketones, ur: NEGATIVE mg/dL
Leukocytes,Ua: NEGATIVE
Nitrite: NEGATIVE
Protein, ur: NEGATIVE mg/dL
Specific Gravity, Urine: 1.019 (ref 1.005–1.030)
pH: 5 (ref 5.0–8.0)

## 2019-10-23 LAB — COMPREHENSIVE METABOLIC PANEL WITH GFR
ALT: 15 U/L (ref 0–44)
AST: 19 U/L (ref 15–41)
Albumin: 3.5 g/dL (ref 3.5–5.0)
Alkaline Phosphatase: 91 U/L (ref 38–126)
Anion gap: 8 (ref 5–15)
BUN: 13 mg/dL (ref 6–20)
CO2: 24 mmol/L (ref 22–32)
Calcium: 9.1 mg/dL (ref 8.9–10.3)
Chloride: 102 mmol/L (ref 98–111)
Creatinine, Ser: 0.8 mg/dL (ref 0.61–1.24)
GFR calc Af Amer: >60 mL/min
GFR calc non Af Amer: >60 mL/min
Glucose, Bld: 101 mg/dL — ABNORMAL HIGH (ref 70–99)
Potassium: 3.9 mmol/L (ref 3.5–5.1)
Sodium: 134 mmol/L — ABNORMAL LOW (ref 135–145)
Total Bilirubin: 0.4 mg/dL (ref 0.3–1.2)
Total Protein: 6.6 g/dL (ref 6.5–8.1)

## 2019-10-23 LAB — TYPE AND SCREEN
ABO/RH(D): O POS
Antibody Screen: NEGATIVE

## 2019-10-23 LAB — CBC
HCT: 40.2 % (ref 39.0–52.0)
Hemoglobin: 13.6 g/dL (ref 13.0–17.0)
MCH: 29.2 pg (ref 26.0–34.0)
MCHC: 33.8 g/dL (ref 30.0–36.0)
MCV: 86.3 fL (ref 80.0–100.0)
Platelets: 371 10*3/uL (ref 150–400)
RBC: 4.66 MIL/uL (ref 4.22–5.81)
RDW: 14.6 % (ref 11.5–15.5)
WBC: 7.8 10*3/uL (ref 4.0–10.5)
nRBC: 0 % (ref 0.0–0.2)

## 2019-10-23 LAB — SURGICAL PCR SCREEN
MRSA, PCR: NEGATIVE
Staphylococcus aureus: NEGATIVE

## 2019-10-23 LAB — PROTIME-INR
INR: 1 (ref 0.8–1.2)
Prothrombin Time: 12.8 seconds (ref 11.4–15.2)

## 2019-10-23 LAB — APTT: aPTT: 32 seconds (ref 24–36)

## 2019-10-23 LAB — GLUCOSE, CAPILLARY: Glucose-Capillary: 102 mg/dL — ABNORMAL HIGH (ref 70–99)

## 2019-10-23 MED ORDER — AMLODIPINE BESYLATE 5 MG PO TABS: 5.0000 mg | ORAL_TABLET | Freq: Every day | ORAL | 0 refills | Status: DC

## 2019-10-23 NOTE — Progress Notes (Signed)
I,Yamilka Roman [employer] as a Neurosurgeon for [employer], FNP.,have documented all relevant documentation on the behalf of Arnette Felts, FNP,as directed by  Arnette Felts, FNP while in the presence of Arnette Felts, FNP.  This visit occurred during the SARS-CoV-2 public health emergency.  Safety protocols were in place, including screening questions prior to the visit, additional usage of staff PPE, and extensive cleaning of exam room while observing appropriate contact time as indicated for disinfecting solutions.  Subjective:     Patient ID: Benjamin [name] , male    DOB: 1960-09-26 , 59 y.o.   MRN: [id number]   Chief Complaint  Patient presents with  . Hypertension    HPI  His blood pressure is elevated today and his procedure for his PAD is on hold.  He had not been taking his amlodipine  Hypertension This is a chronic problem. The current episode started more than 1 year ago. The problem is uncontrolled. Pertinent negatives include no chest pain or palpitations. Risk factors for coronary artery disease include sedentary lifestyle and smoking/tobacco exposure. Past treatments include nothing. Compliance problems: not taking his medication.  There is no history of angina. There is no history of chronic renal disease.     Past Medical History:  Diagnosis Date  . Back pain   . Heavy smoker   . Hyperlipidemia LDL goal <70   . Hypertension   . 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   Bilateral femoral artery disease     Family History  Problem Relation Age of Onset  . Arthritis Mother   . COPD Father   . Stroke Father   . Multiple sclerosis Sister      Current Outpatient Medications:  .  acetaminophen (TYLENOL) 500 MG tablet, Take 500 mg by mouth every 6 (six) hours as needed for mild pain or headache., Disp: , Rfl:  .  Cholecalciferol (VITAMIN D) 50 MCG (2000 UT) tablet, Take 2,000 Units by mouth daily., Disp: , Rfl:  .  cilostazol (PLETAL) 100 MG tablet, Take 1  tablet (100 mg total) by mouth 2 (two) times daily before a meal., Disp: 60 tablet, Rfl: 1 .  collagenase (SANTYL) ointment, Apply 1 application topically daily. Apply ointment the thickness of a nickel to the wound daily with a saline moistened gauze wrap and then dry gauze and gauze wrap, Disp: 15 g, Rfl: 0 .  gabapentin (NEURONTIN) 100 MG capsule, TAKE 1 CAPSULE(100 MG) BY MOUTH THREE TIMES DAILY (Patient taking differently: Take 100 mg by mouth in the morning, at noon, and at bedtime. ), Disp: 90 capsule, Rfl: 2 .  Glucosamine 500 MG CAPS, Take 500 mg by mouth daily., Disp: , Rfl:  .  Magnesium 200 MG TABS, Take 1 tablet by mouth daily with evening meal (Patient taking differently: Take 200 mg by mouth every evening. Take 1 tablet by mouth daily with evening meal), Disp: 30 tablet, Rfl: 2 .  varenicline (CHANTIX CONTINUING MONTH PAK) 1 MG tablet, Take 1 tablet (1 mg total) by mouth 2 (two) times daily., Disp: 60 tablet, Rfl: 2 .  amLODipine (NORVASC) 5 MG tablet, Take 1 tablet (5 mg total) by mouth daily., Disp: 30 tablet, Rfl: 0 .  APPLE CIDER VINEGAR PO, Take 1 tablet by mouth 2 (two) times a week., Disp: , Rfl:  .  atorvastatin (LIPITOR) 80 MG tablet, Take 1 tablet (80 mg total) by mouth daily., Disp: 30 tablet, Rfl: 2 .  clopidogrel (PLAVIX) 75 MG tablet, Take 1 tablet (  75 mg total) by mouth daily., Disp: 30 tablet, Rfl: 11 .  HYDROcodone-acetaminophen (NORCO/VICODIN) 5-325 MG tablet, Take 1 tablet by mouth every 4 (four) hours as needed for moderate pain., Disp: 20 tablet, Rfl: 0 .  Multiple Vitamin (MULTIVITAMIN WITH MINERALS) TABS tablet, Take 1 tablet by mouth daily., Disp: , Rfl:  .  OVER THE COUNTER MEDICATION, Take 2 capsules by mouth daily as needed (pain). Stacker 2 Fat Burner, Disp: , Rfl:    Allergies  Allergen Reactions  . Aspirin Nausea And Vomiting  . Garlic Other (See Comments)    sneezing     Review of Systems  Constitutional: Negative.  Negative for fatigue.   Respiratory: Negative.   Cardiovascular: Negative.  Negative for chest pain, palpitations and leg swelling.  Psychiatric/Behavioral: Negative.      Today's Vitals   10/23/19 1548  BP: (!) 142/98  Pulse: 94  Temp: 98.2 F (36.8 C)  TempSrc: Oral  Weight: 146 lb (66.2 kg)  Height: 5\' 7"  (1.702 m)  PainSc: 0-No pain   Body mass index is 22.87 kg/m.   Objective:  Physical Exam Constitutional:      General: He is not in acute distress.    Appearance: Normal appearance.  Cardiovascular:     Rate and Rhythm: Normal rate and regular rhythm.     Pulses: Normal pulses.     Heart sounds: Normal heart sounds. No murmur heard.   Pulmonary:     Effort: Pulmonary effort is normal. No respiratory distress.     Breath sounds: Normal breath sounds.  Skin:    Capillary Refill: Capillary refill takes less than 2 seconds.  Neurological:     General: No focal deficit present.     Mental Status: He is alert.     Cranial Nerves: No cranial nerve deficit.  Psychiatric:        Mood and Affect: Mood normal.        Behavior: Behavior normal.        Thought Content: Thought content normal.        Judgment: Judgment normal.         Assessment And Plan:     1. Uncontrolled hypertension  He has not been taking his amlodipine, I have refilled his medication and he will return in 1 week for a nurse visit.   He is encouraged to quit smoking.   - amLODipine (NORVASC) 5 MG tablet; Take 1 tablet (5 mg total) by mouth daily.  Dispense: 30 tablet; Refill: 0   He says he has had the covid vaccination but does not have the dates.   Patient was given opportunity to ask questions. Patient verbalized understanding of the plan and was able to repeat key elements of the plan. All questions were answered to their satisfaction.    , FNP, have reviewed all documentation for this visit. The documentation on 11/17/19 for the exam, diagnosis, procedures, and orders are all accurate and  complete.  THE PATIENT IS ENCOURAGED TO PRACTICE SOCIAL DISTANCING DUE TO THE COVID-19 PANDEMIC.

## 2019-10-23 NOTE — Patient Instructions (Addendum)

## 2019-10-23 NOTE — Progress Notes (Signed)
Anesthesia PAT Evaluation:  Case: [id number] Date/Time: 10/25/19 0715   Procedures:      RIGHT TO LEFT FEMORAL-FEMORAL ARTERY BYPASS GRAFT (Bilateral )     LEFT FEMORAL-POPLITEAL ARTERY BYPASS GRAFT (Left )   Anesthesia type: General   Pre-op diagnosis: SUPERFICIAL FEMORAL ARTERY OCCLUSION   [facility]: [facility] OR ROOM 11 / [facility]   Surgeons: Nada Libman, MD       DISCUSSION: Patient is a 59 year old male scheduled for the above procedure.  History includes smoking (1 1/2 PPD), HLD, PAD with left foot ulcer (s/p right CIA stent, left CIA stent 10/15/19), HTN.  Prescribed amlodipine 5 mg 04/21/18 during ED visit with SBP > 180. Patient never had a refill). He was advised to contact PCP for follow-up.  BP Readings from Last 3 Encounters:  10/23/19 (!) 155/100  10/21/19 (!) 155/100  10/15/19 (!) 162/92    He saw cardiologist Dr. Herbie Baltimore in 2018. Echo then showed LVEF 45-50% with diffuse hypokinesis. Known significant PAD and smoking, so Myoview stress test ordered 09/06/16, but never done, at least partially due to patient having transportation issues. Patient denied SOB and chest pain, palpitations, syncope. He does have LLE edema since he has had ulcerations. He does say he gets occasional dizziness--story somewhat difficult to put together, but he describes times where it is related to not eating much, after taking "sleeping pill", or sometimes when his BP is elevated. He had an episode while at PAT that was brief and was not associated with other symptoms such as syncope, chest pain, SOB, visual changes, or headaches. CBG was 102. BP 159/106. He was given water to drink. No residual symptoms by the time I evaluated him a few minutes later. He lives with his sister and relies on her, his brother, or public transportation. He has LLE claudication at < 1 block and has to rest to get to the bus stop 2 blocks away from his home. Up until a few weeks ago, he was still working which involved doing things like  taking out the trash, washing dishes, cleaning up without chest pain or SOB (just walking limited by claudication). He was able to weed eat his sister's yard which took about 15 minutes.    Posting says to hold Pletal starting 10/21/19, but he says he was told to continue, so last dose 10/22/19. Confirmed with Dr. Myra Gianotti that Pletal should be held for surgery.    Mr. Kussman without CV symptoms, but activity limited by severe LLE claudication with foot wound. He had an abnormal echo in 2018 and ordered stress test was never done. Discussed with anesthesiologist Arta Bruce, MD. Preoperative cardiology evaluation recommended. Per Dr. Myra Gianotti, patient is at high risk for limb loss so cardiology evaluation would need to be arranged ASAP. Their office was able to schedule visit for 10/25/19. Patient is also being evaluated by Arnette Felts, FNP for HTN.    VS: BP (!) 155/100   Pulse 75   Temp 36.9 C (Oral)   Resp 18   Ht 5\' 7"  (1.702 m)   Wt 66.2 kg   SpO2 99%   BMI 22.87 kg/m  159/106 on recheck. Heart RRR, no murmur noted. Lungs clear. No carotid bruits noted. + LLE edema with leg dressing.   PROVIDERS: , FNP is PCP  Arnette Felts, MD is cardiologist. Last visit 09/06/16.    LABS: Labs reviewed: Acceptable for surgery. (all labs ordered are listed, but only abnormal results are displayed)  Labs Reviewed  COMPREHENSIVE METABOLIC PANEL - Abnormal; Notable for the following components:      Result Value   Sodium 134 (*)    Glucose, Bld 101 (*)    All other components within normal limits  GLUCOSE, CAPILLARY - Abnormal; Notable for the following components:   Glucose-Capillary 102 (*)    All other components within normal limits  SURGICAL PCR SCREEN  APTT  CBC  PROTIME-INR  URINALYSIS, ROUTINE W REFLEX MICROSCOPIC  TYPE AND SCREEN     EKG: 05/15/19: SR, LAFB, Non-specific T wave abnormality.    CV: Echo 07/27/16: Study Conclusions  - Left ventricle: The cavity size  was mildly dilated. Wall    thickness was normal. Systolic function was mildly reduced. The    estimated ejection fraction was in the range of 45% to 50%.    Diffuse hypokinesis. Left ventricular diastolic function    parameters were normal.  - Atrial septum: No defect or patent foramen ovale was identified.    Past Medical History:  Diagnosis Date   Back pain    Heavy smoker    Hyperlipidemia LDL goal <70    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   Bilateral femoral artery disease    Past Surgical History:  Procedure Laterality Date   ABDOMINAL AORTOGRAM W/LOWER EXTREMITY N/A 07/12/2016   Procedure: Abdominal Aortogram w/Lower Extremity;  Surgeon: Nada Libman, MD;  Location: [facility] INVASIVE [facility] LAB;  Service: Cardiovascular;  Laterality: N/A;  Bilateral extermity: Patent Renal As. No sig Dz in infrarenal Abd Aorta. Normal Bilat Iliac arteries. R SFA is 100% @ origin - recon in AK-Pop A. R PT A patent. L CFA occluded. L PFA recon @ origin, L SFA occluded w/ recon in AK Pop A.   ABDOMINAL AORTOGRAM W/LOWER EXTREMITY N/A 10/15/2019   Procedure: ABDOMINAL AORTOGRAM W/LOWER EXTREMITY;  Surgeon: Nada Libman, MD;  Location: [facility];  Service: Cardiovascular;  Laterality: N/A;   COLONOSCOPY  06/2016   never   NO PAST SURGERIES  06/2016   PERIPHERAL VASCULAR INTERVENTION  10/15/2019   Procedure: PERIPHERAL VASCULAR INTERVENTION;  Surgeon: Nada Libman, MD;  Location: [facility];  Service: Cardiovascular;;  Rt Iliac    MEDICATIONS:  acetaminophen (TYLENOL) 500 MG tablet   amLODipine (NORVASC) 5 MG tablet   atorvastatin (LIPITOR) 20 MG tablet   Cholecalciferol (VITAMIN D) 50 MCG (2000 UT) tablet   cilostazol (PLETAL) 100 MG tablet   collagenase (SANTYL) ointment   gabapentin (NEURONTIN) 100 MG capsule   Glucosamine 500 MG CAPS   HYDROcodone-acetaminophen (NORCO/VICODIN) 5-325 MG tablet   Magnesium 200 MG TABS   Multiple Vitamins-Minerals  (DAILY-VITE WEIGHT CONTROL PO)   Multiple Vitamins-Minerals (MULTIVITAMIN WITH MINERALS) tablet   varenicline (CHANTIX CONTINUING MONTH PAK) 1 MG tablet   Vitamin D, Ergocalciferol, (DRISDOL) 1.25 MG (50000 UNIT) CAPS capsule   No current facility-administered medications for this encounter.     Shonna Chock, PA-C Surgical Short Stay/Anesthesiology [facility] Phone [phone] [facility] Phone [phone] 10/23/2019 5:30 PM

## 2019-10-23 NOTE — Progress Notes (Signed)
PCP - Arnette Felts, FNP Cardiologist - patient denies, saw Dr. Herbie Baltimore in 2018  PPM/ICD - n/a Device Orders -  Rep Notified -   Chest x-ray - n/a EKG - 05/15/2019 Stress Test - patient denies ECHO - 07/27/2016 Cardiac Cath - patient denies  Sleep Study - patient denies CPAP -   Fasting Blood Sugar - n/a Checks Blood Sugar _____ times a day  Blood Thinner Instructions: patient stated he was instructed to continue his pletal, posting states to stop on 10/21/19 Aspirin Instructions: patient no longer takes aspirin, added to allergy list as an intolerance  ERAS Protcol - n/a PRE-SURGERY Ensure or G2-   COVID TEST- scheduled for 10/24/19   Anesthesia review: yes, history of echo and elevated BP at PAT appointment  Patient denies shortness of breath, fever, cough and chest pain at PAT appointment.   All instructions explained to the patient, with a verbal understanding of the material. Patient agrees to go over the instructions while at home for a better understanding. Patient also instructed to self quarantine after being tested for COVID-19. The opportunity to ask questions was provided.

## 2019-10-24 ENCOUNTER — Inpatient Hospital Stay (HOSPITAL_COMMUNITY): Admission: RE | Admit: 2019-10-24 | Payer: Managed Care, Other (non HMO) | Source: Ambulatory Visit

## 2019-10-25 ENCOUNTER — Ambulatory Visit: Payer: Managed Care, Other (non HMO) | Admitting: Cardiology

## 2019-10-25 ENCOUNTER — Inpatient Hospital Stay (HOSPITAL_COMMUNITY): Admission: RE | Admit: 2019-10-25 | Payer: Managed Care, Other (non HMO) | Source: Home / Self Care | Admitting: Surgery

## 2019-10-25 ENCOUNTER — Encounter (HOSPITAL_COMMUNITY): Admission: RE | Payer: Self-pay | Source: Home / Self Care

## 2019-10-25 SURGERY — CREATION, BYPASS, ARTERIAL, FEMORAL TO FEMORAL, USING GRAFT
Anesthesia: General | Laterality: Left

## 2019-10-28 ENCOUNTER — Encounter: Payer: Self-pay | Admitting: Cardiovascular Disease

## 2019-10-28 NOTE — Progress Notes (Signed)
Cardiology Office Note:    Date:  10/29/2019   ID:  [name], DOB 1960-06-26, MRN [id number]  PCP:  Arnette Felts, FNP  [facility] Cardiologist:  [facility] Electrophysiologist:  None   Referring MD: Arnette Felts, FNP   Chief Complaint  Patient presents with  . PAD    Sept. 7, 20212    Benjamin [name] is a 59 y.o. male with a hx of PAD .  We were asked to see him by Dr. Myra Gianotti for pre-op clearance prior to left leg fem - fem bypass   He is an established patient of Dr. Herbie Baltimore  Hx of smoking Dr. Herbie Baltimore saw him in 2018 for pre-op eval Lexiscan myoview was ordered but was never completed.  No hx of CP .  No regular exercise Still smokes   Quite a bit of foot pain  Has a non healing ulcer on the top of his left foot     Past Medical History:  Diagnosis Date  . Back pain   . Heavy smoker   . Hyperlipidemia LDL goal <70   . Hypertension   . 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   Bilateral femoral artery disease    Past Surgical History:  Procedure Laterality Date  . ABDOMINAL AORTOGRAM W/LOWER EXTREMITY N/A 07/12/2016   Procedure: Abdominal Aortogram w/Lower Extremity;  Surgeon: Nada Libman, MD;  [facility];  Service: Cardiovascular;  Laterality: N/A;  Bilateral extermity: Patent Renal As. No sig Dz in infrarenal Abd Aorta. Normal Bilat Iliac arteries. R SFA is 100% @ origin - recon in AK-Pop A. R PT A patent. L CFA occluded. L PFA recon @ origin, L SFA occluded w/ recon in AK Pop A.  . ABDOMINAL AORTOGRAM W/LOWER EXTREMITY N/A 10/15/2019   Procedure: ABDOMINAL AORTOGRAM W/LOWER EXTREMITY;  Surgeon: Nada Libman, MD;  [facility];  Service: Cardiovascular;  Laterality: N/A;  . COLONOSCOPY  06/2016   never  . NO PAST SURGERIES  06/2016  . PERIPHERAL VASCULAR INTERVENTION  10/15/2019   Procedure: PERIPHERAL VASCULAR INTERVENTION;  Surgeon: Nada Libman, MD;  [facility];   Service: Cardiovascular;;  Rt Iliac    Current Medications: Current Meds  Medication Sig  . acetaminophen (TYLENOL) 500 MG tablet Take 500 mg by mouth every 6 (six) hours as needed for mild pain or headache.  Marland Kitchen amLODipine (NORVASC) 5 MG tablet Take 1 tablet (5 mg total) by mouth daily.  Marland Kitchen atorvastatin (LIPITOR) 20 MG tablet Take 1 tablet (20 mg total) by mouth daily.  . Cholecalciferol (VITAMIN D) 50 MCG (2000 UT) tablet Take 2,000 Units by mouth daily.  . cilostazol (PLETAL) 100 MG tablet Take 1 tablet (100 mg total) by mouth 2 (two) times daily before a meal.  . collagenase (SANTYL) ointment Apply 1 application topically daily. Apply ointment the thickness of a nickel to the wound daily with a saline moistened gauze wrap and then dry gauze and gauze wrap  . gabapentin (NEURONTIN) 100 MG capsule TAKE 1 CAPSULE(100 MG) BY MOUTH THREE TIMES DAILY  . Glucosamine 500 MG CAPS Take 500 mg by mouth daily.  Marland Kitchen HYDROcodone-acetaminophen (NORCO/VICODIN) 5-325 MG tablet Take 1-2 tablets by mouth every 6 (six) hours as needed.  . Magnesium 200 MG TABS Take 1 tablet by mouth daily with evening meal  . Multiple Vitamins-Minerals (DAILY-VITE WEIGHT CONTROL PO) Take 1 tablet by mouth daily.   . Multiple Vitamins-Minerals (MULTIVITAMIN WITH  MINERALS) tablet Take 1 tablet by mouth 2 (two) times a week. Apple cider Vinegar  . varenicline (CHANTIX CONTINUING MONTH PAK) 1 MG tablet Take 1 tablet (1 mg total) by mouth 2 (two) times daily.     Allergies:   Aspirin and Garlic   Social History   Socioeconomic History  . Marital status: Single    Spouse name: Not on file  . Number of children: Not on file  . Years of education: 55  . Highest education level: Not on file  Occupational History  . Not on file  Tobacco Use  . Smoking status: Current Every Day Smoker    Packs/day: 1.50  . Smokeless tobacco: Never Used  . Tobacco comment: At least 20 years  Vaping Use  . Vaping Use: Never used  Substance and  Sexual Activity  . Alcohol use: No  . Drug use: No  . Sexual activity: Not Currently  Other Topics Concern  . 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   He lives with his sister and her son.   Walks every day to work at least 15+ minutes.   Social Determinants of Health   Financial Resource Strain:   . Difficulty of Paying Living Expenses: Not on file  Food Insecurity:   . Worried About Programme researcher, broadcasting/film/video in the Last Year: Not on file  . Ran Out of Food in the Last Year: Not on file  Transportation Needs:   . Lack of Transportation (Medical): Not on file  . Lack of Transportation (Non-Medical): Not on file  Physical Activity:   . Days of Exercise per Week: Not on file  . Minutes of Exercise per Session: Not on file  Stress:   . Feeling of Stress : Not on file  Social Connections:   . Frequency of Communication with Friends and Family: Not on file  . Frequency of Social Gatherings with Friends and Family: Not on file  . Attends Religious Services: Not on file  . Active Member of Clubs or Organizations: Not on file  . Attends Banker Meetings: Not on file  . Marital Status: Not on file     Family History: The patient's family history includes Arthritis in his mother; COPD in his father; Multiple sclerosis in his sister; Stroke in his father.  ROS:   Please see the history of present illness.     All other systems reviewed and are negative.  EKGs/Labs/Other Studies Reviewed:    The following studies were reviewed today:   EKG:    Recent Labs: 05/15/2019: TSH 1.130 10/23/2019: ALT 15; BUN 13; Creatinine, Ser 0.80; Hemoglobin 13.6; Platelets 371; Potassium 3.9; Sodium 134  Recent Lipid Panel    Component Value Date/Time   CHOL 131 10/01/2019 1041   TRIG 96 10/01/2019 1041   HDL 52 10/01/2019 1041   CHOLHDL 2.5 10/01/2019 1041   CHOLHDL 3.2 01/20/2017 0908   VLDL 26 06/24/2016 1024   LDLCALC 61 10/01/2019  1041   LDLCALC 104 (H) 01/20/2017 0908    Physical Exam:    VS:  BP 134/86   Pulse (!) 108   Ht 5\' 7"  (1.702 m)   Wt 147 lb 12.8 oz (67 kg)   SpO2 96%   BMI 23.15 kg/m     Wt Readings from Last 3 Encounters:  10/29/19 147 lb 12.8 oz (67 kg)  10/29/19 147 lb (66.7 kg)  10/23/19 146  lb (66.2 kg)     GEN:  Well nourished, well developed in no acute distress HEENT: Normal NECK: No JVD; No carotid bruits LYMPHATICS: No lymphadenopathy CARDIAC:  RR   RESPIRATORY:  Clear to auscultation without rales, wheezing or rhonchi  ABDOMEN: Soft, non-tender, non-distended MUSCULOSKELETAL:   1-2 + pitting edema  SKIN: Warm and dry NEUROLOGIC:  Alert and oriented x 3 PSYCHIATRIC:  Normal affect   ASSESSMENT:    1. Peripheral vascular disease (HCC)    PLAN:    In order of problems listed above:  1. PAD :  Needs leg surgery  Left  Fem - fem bypass.  Needs to stop smoking  We discussed the possibility that he might need an amputation if he does not quit smoking   2.  Leg edema :     keeps his legs down all the time .  Even at night Advised him to keep his his legs elevated.  Gave him information about the lounge doctor  He needs to elevated his legs at night when he sleeps.   He currently keeps his feet down all day and night .     Medication Adjustments/Labs and Tests Ordered: Current medicines are reviewed at length with the patient today.  Concerns regarding medicines are outlined above.  No orders of the defined types were placed in this encounter.  No orders of the defined types were placed in this encounter.   Patient Instructions    For your  leg edema you  should do  the following 1. Leg elevation - I recommend the Lounge Dr. Leg rest.  See below for details  2. Salt restriction  -  Use potassium chloride instead of regular salt as a salt substitute. 3. Walk regularly 4. Compression hose - [facility] 5. Weight loss    Available on [email or web address] Or   Go to [email or web address]         Signed, Kristeen Miss, MD  10/29/2019 2:28 PM    [facility]

## 2019-10-29 ENCOUNTER — Encounter: Payer: Self-pay | Admitting: Cardiovascular Disease

## 2019-10-29 ENCOUNTER — Ambulatory Visit: Payer: Managed Care, Other (non HMO)

## 2019-10-29 ENCOUNTER — Encounter: Payer: Self-pay | Admitting: Nurse Practitioner

## 2019-10-29 ENCOUNTER — Other Ambulatory Visit: Payer: Self-pay

## 2019-10-29 ENCOUNTER — Ambulatory Visit (INDEPENDENT_AMBULATORY_CARE_PROVIDER_SITE_OTHER): Payer: Managed Care, Other (non HMO) | Admitting: Cardiovascular Disease

## 2019-10-29 VITALS — BP 134/86 | HR 108 | Ht 67.0 in | Wt 147.8 lb

## 2019-10-29 VITALS — BP 142/86 | HR 106 | Temp 97.9°F | Ht 67.0 in | Wt 147.0 lb

## 2019-10-29 DIAGNOSIS — I10 Essential (primary) hypertension: Secondary | ICD-10-CM

## 2019-10-29 DIAGNOSIS — Z01810 Encounter for preprocedural cardiovascular examination: Secondary | ICD-10-CM

## 2019-10-29 DIAGNOSIS — E785 Hyperlipidemia, unspecified: Secondary | ICD-10-CM

## 2019-10-29 DIAGNOSIS — I739 Peripheral vascular disease, unspecified: Secondary | ICD-10-CM

## 2019-10-29 DIAGNOSIS — F17200 Nicotine dependence, unspecified, uncomplicated: Secondary | ICD-10-CM

## 2019-10-29 NOTE — Progress Notes (Signed)
Pt presents today for b/p check he is currently taking amlodipine 5mg   Per JM Great, have him to continue with that dose make sure he has a follow up in the next 6 weeks

## 2019-10-29 NOTE — Patient Instructions (Addendum)
Medication Instructions:  Your physician recommends that you continue on your current medications as directed. Please refer to the Current Medication list given to you today.  *If you need a refill on your cardiac medications before your next appointment, please call your pharmacy*   Lab Work: None Ordered If you have labs (blood work) drawn today and your tests are completely normal, you will receive your results only by: Marland Kitchen MyChart Message (if you have MyChart) OR . A paper copy in the mail If you have any lab test that is abnormal or we need to change your treatment, we will call you to review the results.   Testing/Procedures: Your physician has requested that you have a lexiscan myoview. For further information please visit [email or web address] Please follow instruction sheet, as given.  Due to recent COVID-19 restrictions implemented by our local and state authorities and in an effort to keep both patients and staff as safe as possible, our hospital system requires COVID-19 testing prior to certain scheduled hospital procedures.  Please go to [address]. [place], [place] [address] on _______ at _________  .  This is a drive up testing site.  You will not need to exit your vehicle.  You will not be billed at the time of testing but may receive a bill later depending on your insurance. You must agree to self-quarantine from the time of your testing until the procedure date on _____________.  This should included staying home with ONLY the people you live with.  Avoid take-out, grocery store shopping or leaving the house for any non-emergent reason.  Failure to have your COVID-19 test done on the date and time you have been scheduled will result in cancellation of your procedure.  Please call our office at [phone] if you have any questions.    Follow-Up: At [facility], you and your health needs are our priority.  As part of our continuing mission to provide you with exceptional  heart care, we have created designated Provider Care Teams.  These Care Teams include your primary Cardiologist (physician) and Advanced Practice Providers (APPs -  Physician Assistants and Nurse Practitioners) who all work together to provide you with the care you need, when you need it.  We recommend signing up for the patient portal called "MyChart".  Sign up information is provided on this After Visit Summary.  MyChart is used to connect with patients for Virtual Visits (Telemedicine).  Patients are able to view lab/test results, encounter notes, upcoming appointments, etc.  Non-urgent messages can be sent to your provider as well.   To learn more about what you can do with MyChart, go to [email or web address].    Your next appointment:   6 month(s)  The format for your next appointment:   In Person  Provider:   You may see Kristeen Miss, MD or one of the following Advanced Practice Providers on your designated Care Team:    Tereso Newcomer, PA-C  [place], [place]    Other Instructions  For your  leg edema you  should do  the following 1. Leg elevation - I recommend the Lounge Dr. Leg rest.  See below for details  2. Salt restriction  -  Use potassium chloride instead of regular salt as a salt substitute. 3. Walk regularly 4. Compression hose - [facility] 5. Weight loss    Available on [email or web address] Or  Go to [email or web address]

## 2019-10-31 ENCOUNTER — Telehealth (HOSPITAL_COMMUNITY): Payer: Self-pay | Admitting: *Deleted

## 2019-10-31 NOTE — Telephone Encounter (Signed)
Patient given detailed instructions per Myocardial Perfusion Study Information Sheet for the test on 11/06/19. Patient notified to arrive 15 minutes early and that it is imperative to arrive on time for appointment to keep from having the test rescheduled. ° If you need to cancel or reschedule your appointment, please call the office within 24 hours of your appointment. . Patient verbalized understanding. Benjamin [name] Jacqueline ° ° ° °

## 2019-11-06 ENCOUNTER — Other Ambulatory Visit: Payer: Self-pay

## 2019-11-06 ENCOUNTER — Ambulatory Visit (HOSPITAL_COMMUNITY): Payer: Managed Care, Other (non HMO) | Attending: Internal Medicine

## 2019-11-06 DIAGNOSIS — Z01810 Encounter for preprocedural cardiovascular examination: Secondary | ICD-10-CM | POA: Diagnosis not present

## 2019-11-06 DIAGNOSIS — I739 Peripheral vascular disease, unspecified: Secondary | ICD-10-CM | POA: Insufficient documentation

## 2019-11-06 LAB — MYOCARDIAL PERFUSION IMAGING
LV dias vol: 114 mL (ref 62–150)
LV sys vol: 62 mL
Peak HR: 100 {beats}/min
Rest HR: 72 {beats}/min
SDS: 1
SRS: 0
SSS: 1
TID: 1.03

## 2019-11-06 MED ORDER — REGADENOSON 0.4 MG/5ML IV SOLN
0.4000 mg | Freq: Once | INTRAVENOUS | Status: AC
  Administered 2019-11-06: 0.4 mg via INTRAVENOUS

## 2019-11-06 MED ORDER — TECHNETIUM TC 99M TETROFOSMIN IV KIT
30.6000 | PACK | Freq: Once | INTRAVENOUS | Status: AC | PRN
  Administered 2019-11-06: 30.6 via INTRAVENOUS
  Filled 2019-11-06: qty 31

## 2019-11-06 MED ORDER — TECHNETIUM TC 99M TETROFOSMIN IV KIT
10.1000 | PACK | Freq: Once | INTRAVENOUS | Status: AC | PRN
  Administered 2019-11-06: 10.1 via INTRAVENOUS
  Filled 2019-11-06: qty 11

## 2019-11-07 NOTE — Progress Notes (Addendum)
Anesthesia Chart Review:  Case: [id number] Date/Time: 11/13/19 1151   Procedures:      BYPASS GRAFT FEMORAL-FEMORAL ARTERY RIGHT TO LEFT (N/A )     BYPASS GRAFT FEMORAL-POPLITEAL ARTERY LEFT (Left )   Anesthesia type: General   Pre-op diagnosis: pvd   [facility] / [facility]   Surgeons: Nada Libman, MD      DISCUSSION: Patient is a 59 year old male scheduled for the above procedure. Surgery was initially scheduled for 10/25/19, but delayed until he could have a preoperative cardiology evaluation due to abnormal echo in 2018 (EF 45-50%, diffuse hypokinesis). Since then his amlodipine was resumed by his PCP, and he was seen by Laqueta Carina, MD and had a non-ischemic stress test, EF 45-50%.     History includes smoking (1 1/2 PPD), HTN, HLD, PAD with left foot ulcer (s/p right CIA stent, left CIA stent 10/15/19), back pain.  He was to hold Pletal for surgery, however at PAT last dose reported was on 11/10/19. VVS notified and patient instructed to continue to hold until after surgery. Presurgical COVID-19 tet is scheduled for 11/12/19. Anesthesia team to evaluate on the day of surgery.    VS: BP (!) 162/98   Pulse 79   Temp (!) 36.4 C (Oral)   Resp 18   Ht 5\' 8"  (1.727 m)   Wt 67.3 kg   SpO2 100%   BMI 22.56 kg/m   BP Readings from Last 3 Encounters:  10/29/19 134/86  10/29/19 (!) 142/86  10/23/19 (!) 159/106    PROVIDERS: 12/23/19, FNP is PCP  Arnette Felts, MD is cardiologist. Previously saw Kristeen Miss, MD in 2018.    LABS: Preoperative labs reviewed.  (all labs ordered are listed, but only abnormal results are displayed)  Labs Reviewed  CBC - Abnormal; Notable for the following components:      Result Value   RBC 4.08 (*)    Hemoglobin 12.4 (*)    HCT 35.7 (*)    All other components within normal limits  COMPREHENSIVE METABOLIC PANEL - Abnormal; Notable for the following components:   Calcium 8.7 (*)    Total Protein 6.3 (*)    Albumin 3.2 (*)     All other components within normal limits  APTT  URINALYSIS, ROUTINE W REFLEX MICROSCOPIC  TYPE AND SCREEN     EKG: 05/15/19: SR, LAFB, Non-specific T wave abnormality.    CV: Nuclear stress test 11/06/19:  Nuclear stress EF: 45%. Visually appears 50%.  There was no ST segment deviation noted during stress.  This is a low risk study.  Mildly reduced EF which is known from prior echocardiogram.  No ischemia or infarction on perfusion images.   Echo 07/27/16: Study Conclusions  - Left ventricle: The cavity size was mildly dilated. Wall  thickness was normal. Systolic function was mildly reduced. The  estimated ejection fraction was in the range of 45% to 50%.  Diffuse hypokinesis. Left ventricular diastolic function  parameters were normal.  - Atrial septum: No defect or patent foramen ovale was identified.    Past Medical History:  Diagnosis Date  . Back pain   . Heavy smoker   . Hyperlipidemia LDL goal <70   . Hypertension   . 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   Bilateral femoral artery disease    Past Surgical History:  Procedure Laterality Date  . ABDOMINAL AORTOGRAM W/LOWER EXTREMITY N/A 07/12/2016   Procedure: Abdominal Aortogram w/Lower Extremity;  Surgeon:  Nada Libman, MD;  [facility];  Service: Cardiovascular;  Laterality: N/A;  Bilateral extermity: Patent Renal As. No sig Dz in infrarenal Abd Aorta. Normal Bilat Iliac arteries. R SFA is 100% @ origin - recon in AK-Pop A. R PT A patent. L CFA occluded. L PFA recon @ origin, L SFA occluded w/ recon in AK Pop A.  . ABDOMINAL AORTOGRAM W/LOWER EXTREMITY N/A 10/15/2019   Procedure: ABDOMINAL AORTOGRAM W/LOWER EXTREMITY;  Surgeon: Nada Libman, MD;  [facility];  Service: Cardiovascular;  Laterality: N/A;  . COLONOSCOPY  06/2016   never  . NO PAST SURGERIES  06/2016  . PERIPHERAL VASCULAR INTERVENTION  10/15/2019   Procedure: PERIPHERAL VASCULAR  INTERVENTION;  Surgeon: Nada Libman, MD;  [facility];  Service: Cardiovascular;;  Rt Iliac    MEDICATIONS: . acetaminophen (TYLENOL) 500 MG tablet  . amLODipine (NORVASC) 5 MG tablet  . APPLE CIDER VINEGAR PO  . atorvastatin (LIPITOR) 20 MG tablet  . Cholecalciferol (VITAMIN D) 50 MCG (2000 UT) tablet  . cilostazol (PLETAL) 100 MG tablet  . collagenase (SANTYL) ointment  . gabapentin (NEURONTIN) 100 MG capsule  . Glucosamine 500 MG CAPS  . HYDROcodone-acetaminophen (NORCO/VICODIN) 5-325 MG tablet  . Magnesium 200 MG TABS  . Multiple Vitamin (MULTIVITAMIN WITH MINERALS) TABS tablet  . OVER THE COUNTER MEDICATION  . varenicline (CHANTIX CONTINUING MONTH PAK) 1 MG tablet   No current facility-administered medications for this encounter.    Shonna Chock, PA-C Surgical Short Stay/Anesthesiology [facility] Phone [phone] [facility] Phone [phone] 11/12/2019 9:29 AM

## 2019-11-08 ENCOUNTER — Other Ambulatory Visit: Payer: Self-pay | Admitting: *Deleted

## 2019-11-08 NOTE — Pre-Procedure Instructions (Signed)
Benjamin [name]  11/08/2019      [id number] [place],  - [address] AT [facility] OF [address] [place] [address] Phone: [phone] Fax: [phone]    Your procedure is scheduled on Sept. 22  Report to [facility] Entrance A at 10:00 A.M.  Call this number if you have problems the morning of surgery:  [phone]   Remember:  Do not eat or drink after midnight.    Take these medicines the morning of surgery with A SIP OF WATER :              Tylenol if needed              Amlodipine (norvasc)              Atorvastatin (lipitor)              Gabapentin (neurontin)              hydrocodine if needed              chantix               7 days prior to surgery STOP taking any Aspirin (unless otherwise instructed by your surgeon), Aleve, Naproxen, Ibuprofen, Motrin, Advil, Goody's, BC's, all herbal medications, fish oil, and all vitamins.               Follow your surgeon's instructions on when to stop pletal.  If no instructions were given by your surgeon then you will need to call the office to get those instructions.      Do not wear jewelry  Do not wear lotions, powders, or perfumes, or deodorant.  Do not shave 48 hours prior to surgery.  Men may shave face and neck.  Do not bring valuables to the hospital.  [facility] is not responsible for any belongings or valuables.  Contacts, dentures or bridgework may not be worn into surgery.  Leave your suitcase in the car.  After surgery it may be brought to your room.  For patients admitted to the hospital, discharge time will be determined by your treatment team.  Patients discharged the day of surgery will not be allowed to drive home.    Special instructions:  [place]- Preparing For Surgery  Before surgery, you can play an important role. Because skin is not sterile, your skin needs to be as free of germs as possible. You can reduce the number of  germs on your skin by washing with CHG (chlorahexidine gluconate) Soap before surgery.  CHG is an antiseptic cleaner which kills germs and bonds with the skin to continue killing germs even after washing.    Oral Hygiene is also important to reduce your risk of infection.  Remember - BRUSH YOUR TEETH THE MORNING OF SURGERY WITH YOUR REGULAR TOOTHPASTE  Please do not use if you have an allergy to CHG or antibacterial soaps. If your skin becomes reddened/irritated stop using the CHG.  Do not shave (including legs and underarms) for at least 48 hours prior to first CHG shower. It is OK to shave your face.  Please follow these instructions carefully.   1. Shower the NIGHT BEFORE SURGERY and the MORNING OF SURGERY with CHG.   2. If you chose to wash your hair, wash your hair first as usual with your normal shampoo.  3. After you shampoo, rinse your hair and body  thoroughly to remove the shampoo.  4. Use CHG as you would any other liquid soap. You can apply CHG directly to the skin and wash gently with a scrungie or a clean washcloth.   5. Apply the CHG Soap to your body ONLY FROM THE NECK DOWN.  Do not use on open wounds or open sores. Avoid contact with your eyes, ears, mouth and genitals (private parts). Wash Face and genitals (private parts)  with your normal soap.  6. Wash thoroughly, paying special attention to the area where your surgery will be performed.  7. Thoroughly rinse your body with warm water from the neck down.  8. DO NOT shower/wash with your normal soap after using and rinsing off the CHG Soap.  9. Pat yourself dry with a CLEAN TOWEL.  10. Wear CLEAN PAJAMAS to bed the night before surgery, wear comfortable clothes the morning of surgery  11. Place CLEAN SHEETS on your bed the night of your first shower and DO NOT SLEEP WITH PETS.    Day of Surgery:  Do not apply any deodorants/lotions.  Please wear clean clothes to the hospital/surgery center.   Remember to brush  your teeth WITH YOUR REGULAR TOOTHPASTE.    Please read over the following fact sheets that you were given.

## 2019-11-11 ENCOUNTER — Other Ambulatory Visit: Payer: Self-pay

## 2019-11-11 ENCOUNTER — Telehealth: Payer: Self-pay

## 2019-11-11 ENCOUNTER — Encounter (HOSPITAL_COMMUNITY): Payer: Self-pay

## 2019-11-11 ENCOUNTER — Other Ambulatory Visit: Payer: Self-pay | Admitting: *Deleted

## 2019-11-11 ENCOUNTER — Encounter (HOSPITAL_COMMUNITY)
Admission: RE | Admit: 2019-11-11 | Discharge: 2019-11-11 | Disposition: A | Discharge status: Home / Self Care | Payer: Managed Care, Other (non HMO) | Source: Ambulatory Visit | Attending: Surgery | Admitting: Surgery

## 2019-11-11 DIAGNOSIS — F17210 Nicotine dependence, cigarettes, uncomplicated: Secondary | ICD-10-CM | POA: Insufficient documentation

## 2019-11-11 DIAGNOSIS — Z01812 Encounter for preprocedural laboratory examination: Secondary | ICD-10-CM | POA: Insufficient documentation

## 2019-11-11 DIAGNOSIS — I739 Peripheral vascular disease, unspecified: Secondary | ICD-10-CM | POA: Insufficient documentation

## 2019-11-11 DIAGNOSIS — E785 Hyperlipidemia, unspecified: Secondary | ICD-10-CM | POA: Insufficient documentation

## 2019-11-11 DIAGNOSIS — Z7901 Long term (current) use of anticoagulants: Secondary | ICD-10-CM | POA: Insufficient documentation

## 2019-11-11 DIAGNOSIS — M549 Dorsalgia, unspecified: Secondary | ICD-10-CM | POA: Insufficient documentation

## 2019-11-11 DIAGNOSIS — I10 Essential (primary) hypertension: Secondary | ICD-10-CM | POA: Insufficient documentation

## 2019-11-11 DIAGNOSIS — Z79899 Other long term (current) drug therapy: Secondary | ICD-10-CM | POA: Insufficient documentation

## 2019-11-11 LAB — COMPREHENSIVE METABOLIC PANEL
ALT: 18 U/L (ref 0–44)
AST: 21 U/L (ref 15–41)
Albumin: 3.2 g/dL — ABNORMAL LOW (ref 3.5–5.0)
Alkaline Phosphatase: 96 U/L (ref 38–126)
Anion gap: 11 (ref 5–15)
BUN: 11 mg/dL (ref 6–20)
CO2: 24 mmol/L (ref 22–32)
Calcium: 8.7 mg/dL — ABNORMAL LOW (ref 8.9–10.3)
Chloride: 104 mmol/L (ref 98–111)
Creatinine, Ser: 0.71 mg/dL (ref 0.61–1.24)
GFR calc Af Amer: >60 mL/min (ref >60)
GFR calc non Af Amer: >60 mL/min (ref >60)
Glucose, Bld: 97 mg/dL (ref 70–99)
Potassium: 3.6 mmol/L (ref 3.5–5.1)
Sodium: 139 mmol/L (ref 135–145)
Total Bilirubin: 0.5 mg/dL (ref 0.3–1.2)
Total Protein: 6.3 g/dL — ABNORMAL LOW (ref 6.5–8.1)

## 2019-11-11 LAB — URINALYSIS, ROUTINE W REFLEX MICROSCOPIC
Bilirubin Urine: NEGATIVE
Glucose, UA: NEGATIVE mg/dL
Hgb urine dipstick: NEGATIVE
Ketones, ur: NEGATIVE mg/dL
Leukocytes,Ua: NEGATIVE
Nitrite: NEGATIVE
Protein, ur: NEGATIVE mg/dL
Specific Gravity, Urine: 1.018 (ref 1.005–1.030)
pH: 5 (ref 5.0–8.0)

## 2019-11-11 LAB — CBC
HCT: 35.7 % — ABNORMAL LOW (ref 39.0–52.0)
Hemoglobin: 12.4 g/dL — ABNORMAL LOW (ref 13.0–17.0)
MCH: 30.4 pg (ref 26.0–34.0)
MCHC: 34.7 g/dL (ref 30.0–36.0)
MCV: 87.5 fL (ref 80.0–100.0)
Platelets: 364 10*3/uL (ref 150–400)
RBC: 4.08 MIL/uL — ABNORMAL LOW (ref 4.22–5.81)
RDW: 14.4 % (ref 11.5–15.5)
WBC: 6.5 10*3/uL (ref 4.0–10.5)
nRBC: 0 % (ref 0.0–0.2)

## 2019-11-11 LAB — APTT: aPTT: 35 seconds (ref 24–36)

## 2019-11-11 NOTE — Telephone Encounter (Signed)
Dr. Myra Gianotti made aware pt did not stop his Pletal 5 days prior to upcoming procedure on 11/13/19. Per Lynden Ang in preadmission, pt's last dose was on 9/19. Pt was instructed not to take anymore Pletal prior to surgery. Pt verbalized understanding in the background.

## 2019-11-11 NOTE — Progress Notes (Addendum)
PCP - Jamey Reas FNP Cardiologist - Nasher  PPM/ICD - na   Chest x-ray - na EKG - 3/34/21 Stress Test - 9/21 ECHO - 6/18 Cardiac Cath - na  Sleep Study - na CPAP -   Fasting Blood Sugar - na   Blood Thinner Instructions: pt. Stated he was not instructed on when to stop pletal.last dose 11/10/19, called Dr. Estanislado Spire office, notified Kia. Stated pt. Should not take any more. Pt. Verbalized  Understanding. Aspirin Instructions:na  Pt. Instructed to arrive at 0930 DOS. Due to time change  COVID TEST- 11/12/19   Anesthesia review: ekg  Patient denies shortness of breath, fever, cough and chest pain at PAT appointment   All instructions explained to the patient, with a verbal understanding of the material. Patient agrees to go over the instructions while at home for a better understanding. Patient also instructed to self quarantine after being tested for COVID-19. The opportunity to ask questions was provided.

## 2019-11-12 ENCOUNTER — Other Ambulatory Visit (HOSPITAL_COMMUNITY)
Admission: RE | Admit: 2019-11-12 | Discharge: 2019-11-12 | Disposition: A | Discharge status: Home / Self Care | Payer: Managed Care, Other (non HMO) | Source: Ambulatory Visit | Attending: Surgery | Admitting: Surgery

## 2019-11-12 DIAGNOSIS — Z20822 Contact with and (suspected) exposure to covid-19: Secondary | ICD-10-CM | POA: Insufficient documentation

## 2019-11-12 DIAGNOSIS — Z01812 Encounter for preprocedural laboratory examination: Secondary | ICD-10-CM | POA: Insufficient documentation

## 2019-11-12 LAB — SARS CORONAVIRUS 2 (TAT 6-24 HRS): SARS Coronavirus 2: NEGATIVE

## 2019-11-12 NOTE — Anesthesia Preprocedure Evaluation (Addendum)
Anesthesia Evaluation  Patient identified by MRN, date of birth, ID band Patient awake    Reviewed: Allergy & Precautions, H&P , NPO status , Patient's Chart, lab work & pertinent test results  Airway Mallampati: II  TM Distance: >3 FB Neck ROM: Full    Dental no notable dental hx. (+) Edentulous Upper, Edentulous Lower, Dental Advisory Given   Pulmonary Current SmokerPatient did not abstain from smoking.,    Pulmonary exam normal breath sounds clear to auscultation       Cardiovascular hypertension, Pt. on medications + Peripheral Vascular Disease   Rhythm:Regular Rate:Normal     Neuro/Psych negative neurological ROS  negative psych ROS   GI/Hepatic negative GI ROS, Neg liver ROS,   Endo/Other  negative endocrine ROS  Renal/GU negative Renal ROS  negative genitourinary   Musculoskeletal   Abdominal   Peds  Hematology negative hematology ROS (+)   Anesthesia Other Findings   Reproductive/Obstetrics negative OB ROS                            Anesthesia Physical Anesthesia Plan  ASA: III  Anesthesia Plan: General   Post-op Pain Management:    Induction: Intravenous  PONV Risk Score and Plan: 2 and Ondansetron, Dexamethasone and Midazolam  Airway Management Planned: Oral ETT  Additional Equipment:   Intra-op Plan:   Post-operative Plan: Extubation in OR  Informed Consent: I have reviewed the patients History and Physical, chart, labs and discussed the procedure including the risks, benefits and alternatives for the proposed anesthesia with the patient or authorized representative who has indicated his/her understanding and acceptance.     Dental advisory given  Plan Discussed with: CRNA  Anesthesia Plan Comments: (PAT note written 11/12/2019 by Shonna Chock, PA-C. )       Anesthesia Quick Evaluation

## 2019-11-13 ENCOUNTER — Inpatient Hospital Stay (HOSPITAL_COMMUNITY): Payer: Managed Care, Other (non HMO) | Admitting: Vascular Surgery

## 2019-11-13 ENCOUNTER — Other Ambulatory Visit: Payer: Self-pay

## 2019-11-13 ENCOUNTER — Encounter (HOSPITAL_COMMUNITY)
Admission: RE | Disposition: A | Discharge status: Home / Self Care | Payer: Self-pay | Source: Home / Self Care | Attending: Surgery

## 2019-11-13 ENCOUNTER — Inpatient Hospital Stay (HOSPITAL_COMMUNITY)
Admission: RE | Admit: 2019-11-13 | Discharge: 2019-11-17 | DRG: 253 | Disposition: A | Discharge status: Home / Self Care | Payer: Managed Care, Other (non HMO) | Attending: Surgery | Admitting: Surgery

## 2019-11-13 ENCOUNTER — Encounter: Payer: Self-pay | Admitting: Nurse Practitioner

## 2019-11-13 ENCOUNTER — Inpatient Hospital Stay (HOSPITAL_COMMUNITY): Payer: Managed Care, Other (non HMO) | Admitting: Certified Registered"

## 2019-11-13 ENCOUNTER — Encounter (HOSPITAL_COMMUNITY): Payer: Self-pay | Admitting: Surgery

## 2019-11-13 DIAGNOSIS — T82858A Stenosis of vascular prosthetic devices, implants and grafts, initial encounter: Secondary | ICD-10-CM | POA: Diagnosis not present

## 2019-11-13 DIAGNOSIS — L0390 Cellulitis, unspecified: Secondary | ICD-10-CM | POA: Diagnosis not present

## 2019-11-13 DIAGNOSIS — Z7902 Long term (current) use of antithrombotics/antiplatelets: Secondary | ICD-10-CM | POA: Diagnosis not present

## 2019-11-13 DIAGNOSIS — Z91018 Allergy to other foods: Secondary | ICD-10-CM | POA: Diagnosis not present

## 2019-11-13 DIAGNOSIS — I739 Peripheral vascular disease, unspecified: Secondary | ICD-10-CM | POA: Diagnosis present

## 2019-11-13 DIAGNOSIS — Y838 Other surgical procedures as the cause of abnormal reaction of the patient, or of later complication, without mention of misadventure at the time of the procedure: Secondary | ICD-10-CM | POA: Diagnosis not present

## 2019-11-13 DIAGNOSIS — L97429 Non-pressure chronic ulcer of left heel and midfoot with unspecified severity: Secondary | ICD-10-CM | POA: Diagnosis present

## 2019-11-13 DIAGNOSIS — Z20822 Contact with and (suspected) exposure to covid-19: Secondary | ICD-10-CM | POA: Diagnosis present

## 2019-11-13 DIAGNOSIS — I9789 Other postprocedural complications and disorders of the circulatory system, not elsewhere classified: Secondary | ICD-10-CM | POA: Diagnosis not present

## 2019-11-13 DIAGNOSIS — I70212 Atherosclerosis of native arteries of extremities with intermittent claudication, left leg: Secondary | ICD-10-CM | POA: Diagnosis present

## 2019-11-13 DIAGNOSIS — Z886 Allergy status to analgesic agent status: Secondary | ICD-10-CM | POA: Diagnosis not present

## 2019-11-13 DIAGNOSIS — I70244 Atherosclerosis of native arteries of left leg with ulceration of heel and midfoot: Principal | ICD-10-CM | POA: Diagnosis present

## 2019-11-13 DIAGNOSIS — Z79899 Other long term (current) drug therapy: Secondary | ICD-10-CM | POA: Diagnosis not present

## 2019-11-13 DIAGNOSIS — I70245 Atherosclerosis of native arteries of left leg with ulceration of other part of foot: Secondary | ICD-10-CM

## 2019-11-13 DIAGNOSIS — T82898A Other specified complication of vascular prosthetic devices, implants and grafts, initial encounter: Secondary | ICD-10-CM | POA: Diagnosis present

## 2019-11-13 HISTORY — PX: FEMORAL-POPLITEAL BYPASS GRAFT: SHX937

## 2019-11-13 HISTORY — PX: FEMORAL-FEMORAL BYPASS GRAFT: SHX936

## 2019-11-13 LAB — PROTIME-INR
INR: 1.1 (ref 0.8–1.2)
Prothrombin Time: 13.3 seconds (ref 11.4–15.2)

## 2019-11-13 LAB — PREPARE RBC (CROSSMATCH)

## 2019-11-13 SURGERY — CREATION, BYPASS, ARTERIAL, FEMORAL TO FEMORAL, USING GRAFT
Anesthesia: General | Site: Leg Upper

## 2019-11-13 MED ORDER — CEFAZOLIN SODIUM-DEXTROSE 2-4 GM/100ML-% IV SOLN
2.0000 g | Freq: Three times a day (TID) | INTRAVENOUS | Status: AC
  Administered 2019-11-14 (×2): 2 g via INTRAVENOUS
  Filled 2019-11-13 (×2): qty 100

## 2019-11-13 MED ORDER — HEPARIN SODIUM (PORCINE) 1000 UNIT/ML IJ SOLN
INTRAMUSCULAR | Status: DC | PRN
  Administered 2019-11-13: 6500 [IU] via INTRAVENOUS
  Administered 2019-11-13: 3000 [IU] via INTRAVENOUS
  Administered 2019-11-13: 2000 [IU] via INTRAVENOUS

## 2019-11-13 MED ORDER — ATORVASTATIN CALCIUM 10 MG PO TABS
20.0000 mg | ORAL_TABLET | Freq: Every day | ORAL | Status: DC
  Administered 2019-11-14 – 2019-11-15 (×2): 20 mg via ORAL
  Filled 2019-11-13 (×2): qty 2

## 2019-11-13 MED ORDER — PHENYLEPHRINE HCL (PRESSORS) 10 MG/ML IV SOLN
INTRAVENOUS | Status: DC | PRN
  Administered 2019-11-13 (×2): 120 ug via INTRAVENOUS
  Administered 2019-11-13: 100 ug via INTRAVENOUS
  Administered 2019-11-13: 150 ug via INTRAVENOUS
  Administered 2019-11-13: 80 ug via INTRAVENOUS
  Administered 2019-11-13 (×2): 120 ug via INTRAVENOUS
  Administered 2019-11-13: 40 ug via INTRAVENOUS
  Administered 2019-11-13: 80 ug via INTRAVENOUS
  Administered 2019-11-13: 120 ug via INTRAVENOUS

## 2019-11-13 MED ORDER — HYDRALAZINE HCL 20 MG/ML IJ SOLN: 5.0000 mg | INTRAMUSCULAR | Status: DC | PRN

## 2019-11-13 MED ORDER — SODIUM CHLORIDE 0.9 % IV SOLN
INTRAVENOUS | Status: DC | PRN
  Administered 2019-11-13: 15:00:00 500 mL

## 2019-11-13 MED ORDER — LACTATED RINGERS IV SOLN: INTRAVENOUS | Status: DC | PRN

## 2019-11-13 MED ORDER — POTASSIUM CHLORIDE CRYS ER 20 MEQ PO TBCR: 20.0000 meq | EXTENDED_RELEASE_TABLET | Freq: Every day | ORAL | Status: DC | PRN

## 2019-11-13 MED ORDER — DOCUSATE SODIUM 100 MG PO CAPS
100.0000 mg | ORAL_CAPSULE | Freq: Every day | ORAL | Status: DC
  Administered 2019-11-15 – 2019-11-17 (×3): 100 mg via ORAL
  Filled 2019-11-13 (×3): qty 1

## 2019-11-13 MED ORDER — ONDANSETRON HCL 4 MG/2ML IJ SOLN: 4.0000 mg | Freq: Four times a day (QID) | INTRAMUSCULAR | Status: DC | PRN

## 2019-11-13 MED ORDER — LABETALOL HCL 5 MG/ML IV SOLN: 10.0000 mg | INTRAVENOUS | Status: DC | PRN

## 2019-11-13 MED ORDER — HYDROCODONE-ACETAMINOPHEN 5-325 MG PO TABS
1.0000 | ORAL_TABLET | Freq: Four times a day (QID) | ORAL | Status: DC | PRN
  Administered 2019-11-14 – 2019-11-17 (×5): 2 via ORAL
  Filled 2019-11-13 (×5): qty 2

## 2019-11-13 MED ORDER — FENTANYL CITRATE (PF) 250 MCG/5ML IJ SOLN
INTRAMUSCULAR | Status: AC
  Filled 2019-11-13: qty 5

## 2019-11-13 MED ORDER — METOPROLOL TARTRATE 5 MG/5ML IV SOLN: 2.0000 mg | INTRAVENOUS | Status: DC | PRN

## 2019-11-13 MED ORDER — SODIUM CHLORIDE 0.9 % IV SOLN: INTRAVENOUS | Status: DC

## 2019-11-13 MED ORDER — ORAL CARE MOUTH RINSE: 15.0000 mL | Freq: Once | OROMUCOSAL | Status: AC

## 2019-11-13 MED ORDER — ADULT MULTIVITAMIN W/MINERALS CH
1.0000 | ORAL_TABLET | Freq: Every day | ORAL | Status: DC
  Administered 2019-11-15 – 2019-11-17 (×3): 1 via ORAL
  Filled 2019-11-13 (×3): qty 1

## 2019-11-13 MED ORDER — MAGNESIUM SULFATE 2 GM/50ML IV SOLN: 2.0000 g | Freq: Every day | INTRAVENOUS | Status: DC | PRN

## 2019-11-13 MED ORDER — FENTANYL CITRATE (PF) 250 MCG/5ML IJ SOLN
INTRAMUSCULAR | Status: DC | PRN
Start: 2019-11-13 — End: 2019-11-13
  Administered 2019-11-13 (×3): 50 ug via INTRAVENOUS
  Administered 2019-11-13: 100 ug via INTRAVENOUS
  Administered 2019-11-13 (×3): 50 ug via INTRAVENOUS
  Administered 2019-11-13: 100 ug via INTRAVENOUS
  Administered 2019-11-13: 50 ug via INTRAVENOUS

## 2019-11-13 MED ORDER — MIDAZOLAM HCL 5 MG/5ML IJ SOLN
INTRAMUSCULAR | Status: DC | PRN
  Administered 2019-11-13: 2 mg via INTRAVENOUS

## 2019-11-13 MED ORDER — VARENICLINE TARTRATE 1 MG PO TABS
1.0000 mg | ORAL_TABLET | Freq: Two times a day (BID) | ORAL | Status: DC
  Administered 2019-11-14 – 2019-11-17 (×6): 1 mg via ORAL
  Filled 2019-11-13 (×9): qty 1

## 2019-11-13 MED ORDER — SODIUM CHLORIDE 0.9 % IV SOLN: INTRAVENOUS | Status: DC | PRN

## 2019-11-13 MED ORDER — POLYETHYLENE GLYCOL 3350 17 G PO PACK: 17.0000 g | PACK | Freq: Every day | ORAL | Status: DC | PRN

## 2019-11-13 MED ORDER — MIDAZOLAM HCL 2 MG/2ML IJ SOLN
INTRAMUSCULAR | Status: AC
  Filled 2019-11-13: qty 2

## 2019-11-13 MED ORDER — CHLORHEXIDINE GLUCONATE 0.12 % MT SOLN: 15.0000 mL | Freq: Once | OROMUCOSAL | Status: AC

## 2019-11-13 MED ORDER — COLLAGENASE 250 UNIT/GM EX OINT
1.0000 "application " | TOPICAL_OINTMENT | Freq: Every day | CUTANEOUS | Status: DC
  Administered 2019-11-15 – 2019-11-17 (×3): 1 via TOPICAL
  Filled 2019-11-13: qty 30

## 2019-11-13 MED ORDER — ONDANSETRON HCL 4 MG/2ML IJ SOLN
INTRAMUSCULAR | Status: DC | PRN
  Administered 2019-11-13: 4 mg via INTRAVENOUS

## 2019-11-13 MED ORDER — GLUCOSAMINE 500 MG PO CAPS: 500.0000 mg | ORAL_CAPSULE | Freq: Every day | ORAL | Status: DC

## 2019-11-13 MED ORDER — ACETAMINOPHEN 500 MG PO TABS: 1000.0000 mg | ORAL_TABLET | Freq: Once | ORAL | Status: DC

## 2019-11-13 MED ORDER — MORPHINE SULFATE (PF) 2 MG/ML IV SOLN: 2.0000 mg | INTRAVENOUS | Status: DC | PRN

## 2019-11-13 MED ORDER — AMLODIPINE BESYLATE 5 MG PO TABS
5.0000 mg | ORAL_TABLET | Freq: Every day | ORAL | Status: DC
  Administered 2019-11-14 – 2019-11-17 (×4): 5 mg via ORAL
  Filled 2019-11-13 (×4): qty 1

## 2019-11-13 MED ORDER — PHENYLEPHRINE HCL-NACL 10-0.9 MG/250ML-% IV SOLN
INTRAVENOUS | Status: DC | PRN
  Administered 2019-11-13: 30 ug/min via INTRAVENOUS

## 2019-11-13 MED ORDER — SODIUM CHLORIDE 0.9% IV SOLUTION: Freq: Once | INTRAVENOUS | Status: DC

## 2019-11-13 MED ORDER — CHLORHEXIDINE GLUCONATE 0.12 % MT SOLN
OROMUCOSAL | Status: AC
  Administered 2019-11-13: 15 mL via OROMUCOSAL
  Filled 2019-11-13: qty 15

## 2019-11-13 MED ORDER — VASOPRESSIN 20 UNIT/ML IV SOLN
INTRAVENOUS | Status: DC | PRN
  Administered 2019-11-13 (×6): 1 [IU] via INTRAVENOUS

## 2019-11-13 MED ORDER — HYDROMORPHONE HCL 1 MG/ML IJ SOLN
INTRAMUSCULAR | Status: AC
  Administered 2019-11-13: 0.5 mg via INTRAVENOUS
  Filled 2019-11-13: qty 1

## 2019-11-13 MED ORDER — PROPOFOL 10 MG/ML IV BOLUS
INTRAVENOUS | Status: AC
  Filled 2019-11-13: qty 20

## 2019-11-13 MED ORDER — ACETAMINOPHEN 325 MG PO TABS
325.0000 mg | ORAL_TABLET | ORAL | Status: DC | PRN
  Administered 2019-11-14 – 2019-11-16 (×9): 650 mg via ORAL
  Filled 2019-11-13 (×9): qty 2

## 2019-11-13 MED ORDER — SUGAMMADEX SODIUM 200 MG/2ML IV SOLN
INTRAVENOUS | Status: DC | PRN
  Administered 2019-11-13: 200 mg via INTRAVENOUS

## 2019-11-13 MED ORDER — ALBUMIN HUMAN 5 % IV SOLN: INTRAVENOUS | Status: DC | PRN

## 2019-11-13 MED ORDER — LIDOCAINE 2% (20 MG/ML) 5 ML SYRINGE
INTRAMUSCULAR | Status: DC | PRN
  Administered 2019-11-13: 60 mg via INTRAVENOUS

## 2019-11-13 MED ORDER — PHENOL 1.4 % MT LIQD: 1.0000 | OROMUCOSAL | Status: DC | PRN

## 2019-11-13 MED ORDER — 0.9 % SODIUM CHLORIDE (POUR BTL) OPTIME
TOPICAL | Status: DC | PRN
  Administered 2019-11-13: 2000 mL

## 2019-11-13 MED ORDER — ACETAMINOPHEN 650 MG RE SUPP: 325.0000 mg | RECTAL | Status: DC | PRN

## 2019-11-13 MED ORDER — CEFAZOLIN SODIUM-DEXTROSE 2-4 GM/100ML-% IV SOLN
2.0000 g | INTRAVENOUS | Status: AC
  Administered 2019-11-13 (×2): 2 g via INTRAVENOUS
  Filled 2019-11-13: qty 100

## 2019-11-13 MED ORDER — HYDROMORPHONE HCL 1 MG/ML IJ SOLN
0.2500 mg | INTRAMUSCULAR | Status: DC | PRN
  Administered 2019-11-13: 0.5 mg via INTRAVENOUS

## 2019-11-13 MED ORDER — VASOPRESSIN 20 UNIT/ML IV SOLN
INTRAVENOUS | Status: AC
  Filled 2019-11-13: qty 1

## 2019-11-13 MED ORDER — PAPAVERINE HCL 30 MG/ML IJ SOLN
INTRAMUSCULAR | Status: AC
  Filled 2019-11-13: qty 2

## 2019-11-13 MED ORDER — PROTAMINE SULFATE 10 MG/ML IV SOLN
INTRAVENOUS | Status: DC | PRN
  Administered 2019-11-13 (×4): 10 mg via INTRAVENOUS

## 2019-11-13 MED ORDER — PANTOPRAZOLE SODIUM 40 MG PO TBEC
40.0000 mg | DELAYED_RELEASE_TABLET | Freq: Every day | ORAL | Status: DC
  Administered 2019-11-15 – 2019-11-17 (×3): 40 mg via ORAL
  Filled 2019-11-13 (×3): qty 1

## 2019-11-13 MED ORDER — EPINEPHRINE 1 MG/10ML IJ SOSY
PREFILLED_SYRINGE | INTRAMUSCULAR | Status: DC | PRN
  Administered 2019-11-13: .1 mg via INTRAVENOUS

## 2019-11-13 MED ORDER — LACTATED RINGERS IV SOLN: INTRAVENOUS | Status: DC

## 2019-11-13 MED ORDER — GABAPENTIN 100 MG PO CAPS
100.0000 mg | ORAL_CAPSULE | Freq: Three times a day (TID) | ORAL | Status: DC
  Administered 2019-11-14 – 2019-11-17 (×11): 100 mg via ORAL
  Filled 2019-11-13 (×11): qty 1

## 2019-11-13 MED ORDER — CHLORHEXIDINE GLUCONATE CLOTH 2 % EX PADS: 6.0000 | MEDICATED_PAD | Freq: Once | CUTANEOUS | Status: DC

## 2019-11-13 MED ORDER — FENTANYL CITRATE (PF) 250 MCG/5ML IJ SOLN
INTRAMUSCULAR | Status: AC
Start: 2019-11-13 — End: ?
  Filled 2019-11-13: qty 5

## 2019-11-13 MED ORDER — ALUM & MAG HYDROXIDE-SIMETH 200-200-20 MG/5ML PO SUSP: 15.0000 mL | ORAL | Status: DC | PRN

## 2019-11-13 MED ORDER — ROCURONIUM BROMIDE 10 MG/ML (PF) SYRINGE
PREFILLED_SYRINGE | INTRAVENOUS | Status: DC | PRN
  Administered 2019-11-13: 20 mg via INTRAVENOUS
  Administered 2019-11-13 (×2): 80 mg via INTRAVENOUS
  Administered 2019-11-13: 20 mg via INTRAVENOUS
  Administered 2019-11-13: 30 mg via INTRAVENOUS

## 2019-11-13 MED ORDER — BISACODYL 10 MG RE SUPP: 10.0000 mg | Freq: Every day | RECTAL | Status: DC | PRN

## 2019-11-13 MED ORDER — HEPARIN SODIUM (PORCINE) 5000 UNIT/ML IJ SOLN
5000.0000 [IU] | Freq: Three times a day (TID) | INTRAMUSCULAR | Status: DC
  Administered 2019-11-14 – 2019-11-17 (×8): 5000 [IU] via SUBCUTANEOUS
  Filled 2019-11-13 (×9): qty 1

## 2019-11-13 MED ORDER — PROPOFOL 10 MG/ML IV BOLUS
INTRAVENOUS | Status: DC | PRN
  Administered 2019-11-13: 110 mg via INTRAVENOUS
  Administered 2019-11-13: 20 mg via INTRAVENOUS
  Administered 2019-11-13: 30 mg via INTRAVENOUS

## 2019-11-13 MED ORDER — SODIUM CHLORIDE 0.9 % IV SOLN
INTRAVENOUS | Status: AC
  Filled 2019-11-13: qty 1.2

## 2019-11-13 MED ORDER — SODIUM CHLORIDE 0.9 % IV SOLN: 500.0000 mL | Freq: Once | INTRAVENOUS | Status: DC | PRN

## 2019-11-13 MED ORDER — GUAIFENESIN-DM 100-10 MG/5ML PO SYRP: 15.0000 mL | ORAL_SOLUTION | ORAL | Status: DC | PRN

## 2019-11-13 SURGICAL SUPPLY — 65 items
BANDAGE ESMARK 6X9 LF (GAUZE/BANDAGES/DRESSINGS) ×2 IMPLANT
BNDG ESMARK 6X9 LF (GAUZE/BANDAGES/DRESSINGS) ×4
BNDG GAUZE ELAST 4 BULKY (GAUZE/BANDAGES/DRESSINGS) ×4 IMPLANT
CANISTER SUCT 3000ML PPV (MISCELLANEOUS) ×4 IMPLANT
CANNULA VESSEL 3MM 2 BLNT TIP (CANNULA) ×4 IMPLANT
CLIP VESOCCLUDE MED 24/CT (CLIP) ×4 IMPLANT
CLIP VESOCCLUDE SM WIDE 24/CT (CLIP) ×4 IMPLANT
COVER WAND RF STERILE (DRAPES) IMPLANT
CUFF TOURN SGL QUICK 24 (TOURNIQUET CUFF)
CUFF TOURN SGL QUICK 34 (TOURNIQUET CUFF) ×4
CUFF TOURN SGL QUICK 42 (TOURNIQUET CUFF) IMPLANT
CUFF TRNQT CYL 24X4X16.5-23 (TOURNIQUET CUFF) IMPLANT
CUFF TRNQT CYL 34X4.125X (TOURNIQUET CUFF) ×2 IMPLANT
DERMABOND ADVANCED (GAUZE/BANDAGES/DRESSINGS) ×12
DERMABOND ADVANCED .7 DNX12 (GAUZE/BANDAGES/DRESSINGS) ×12 IMPLANT
DRAIN CHANNEL 15F RND FF W/TCR (WOUND CARE) IMPLANT
DRAPE HALF SHEET 40X57 (DRAPES) IMPLANT
DRAPE X-RAY CASS 24X20 (DRAPES) IMPLANT
DRSG EMULSION OIL 3X3 NADH (GAUZE/BANDAGES/DRESSINGS) ×4 IMPLANT
ELECT REM PT RETURN 9FT ADLT (ELECTROSURGICAL) ×4
ELECTRODE REM PT RTRN 9FT ADLT (ELECTROSURGICAL) ×2 IMPLANT
EVACUATOR SILICONE 100CC (DRAIN) IMPLANT
GAUZE SPONGE 4X4 12PLY STRL (GAUZE/BANDAGES/DRESSINGS) ×4 IMPLANT
GLOVE BIOGEL PI IND STRL 7.5 (GLOVE) ×2 IMPLANT
GLOVE BIOGEL PI INDICATOR 7.5 (GLOVE) ×2
GLOVE SURG SS PI 7.5 STRL IVOR (GLOVE) ×4 IMPLANT
GOWN STRL REUS W/ TWL LRG LVL3 (GOWN DISPOSABLE) ×8 IMPLANT
GOWN STRL REUS W/ TWL XL LVL3 (GOWN DISPOSABLE) ×2 IMPLANT
GOWN STRL REUS W/TWL LRG LVL3 (GOWN DISPOSABLE) ×16
GOWN STRL REUS W/TWL XL LVL3 (GOWN DISPOSABLE) ×4
GRAFT HEMASHIELD 8MM (Vascular Products) ×4 IMPLANT
GRAFT VASC STRG 30X8KNIT (Vascular Products) ×2 IMPLANT
HEMOSTAT SNOW SURGICEL 2X4 (HEMOSTASIS) IMPLANT
INSERT FOGARTY SM (MISCELLANEOUS) IMPLANT
KIT BASIN OR (CUSTOM PROCEDURE TRAY) ×4 IMPLANT
KIT TURNOVER KIT B (KITS) ×4 IMPLANT
MARKER GRAFT CORONARY BYPASS (MISCELLANEOUS) IMPLANT
NS IRRIG 1000ML POUR BTL (IV SOLUTION) ×8 IMPLANT
PACK PERIPHERAL VASCULAR (CUSTOM PROCEDURE TRAY) ×4 IMPLANT
PAD ARMBOARD 7.5X6 YLW CONV (MISCELLANEOUS) ×8 IMPLANT
PENCIL BUTTON HOLSTER BLD 10FT (ELECTRODE) ×4 IMPLANT
SET COLLECT BLD 21X3/4 12 (NEEDLE) IMPLANT
SPONGE LAP 18X18 RF (DISPOSABLE) ×4 IMPLANT
SPONGE LAP 18X18 X RAY DECT (DISPOSABLE) ×12 IMPLANT
STOPCOCK 4 WAY LG BORE MALE ST (IV SETS) IMPLANT
SUT ETHILON 3 0 PS 1 (SUTURE) IMPLANT
SUT GORETEX 6.0 TT13 (SUTURE) IMPLANT
SUT GORETEX 6.0 TT9 (SUTURE) IMPLANT
SUT PROLENE 5 0 C 1 24 (SUTURE) ×20 IMPLANT
SUT PROLENE 6 0 BV (SUTURE) ×24 IMPLANT
SUT PROLENE 7 0 BV 1 (SUTURE) IMPLANT
SUT SILK 2 0 SH (SUTURE) ×8 IMPLANT
SUT SILK 3 0 (SUTURE) ×12
SUT SILK 3-0 18XBRD TIE 12 (SUTURE) ×6 IMPLANT
SUT VIC AB 2-0 CT1 27 (SUTURE) ×12
SUT VIC AB 2-0 CT1 TAPERPNT 27 (SUTURE) ×6 IMPLANT
SUT VIC AB 3-0 SH 27 (SUTURE) ×20
SUT VIC AB 3-0 SH 27X BRD (SUTURE) ×10 IMPLANT
SUT VICRYL 4-0 PS2 18IN ABS (SUTURE) ×8 IMPLANT
TAPE UMBILICAL COTTON 1/8X30 (MISCELLANEOUS) ×4 IMPLANT
TOWEL GREEN STERILE (TOWEL DISPOSABLE) ×4 IMPLANT
TRAY FOLEY MTR SLVR 16FR STAT (SET/KITS/TRAYS/PACK) ×4 IMPLANT
TUBING EXTENTION W/L.L. (IV SETS) IMPLANT
UNDERPAD 30X36 HEAVY ABSORB (UNDERPADS AND DIAPERS) ×4 IMPLANT
WATER STERILE IRR 1000ML POUR (IV SOLUTION) ×4 IMPLANT

## 2019-11-13 NOTE — Op Note (Signed)
Patient name: GIANI BETZOLD MRN: [id number] DOB: 1960-08-20 Sex: male  11/13/2019 Pre-operative Diagnosis: Left leg ulcer Post-operative diagnosis:  Same Surgeon:  [name]  Co-surgeon: Tawanna Cooler Early Assistants: Lelon Mast Ryne Procedure:   #1: Right to left femoral-femoral bypass graft with 8 mm dacryon   #2: Left femoral to below-knee popliteal artery bypass graft with ipsilateral nonreversed translocated saphenous vein Anesthesia: General Blood Loss: 1 L Specimens: None  Findings: Dr. Arbie Cookey performed the femoral-femoral bypass graft.  The anastomosis in the right groin was to the common femoral artery.  In the left groin, the superficial femoral artery was transected and the graft was sewn to the distal common femoral artery down onto the profundofemoral artery.  The patient had a uniform caliber vein measuring 4 mm.  The proximal anastomosis was to the femoral-femoral graft just proximal to the anastomosis to the profundofemoral artery.  The distal anastomosis was to the below-knee popliteal artery which was relatively disease-free.  He had a peroneal and posterior tibial Doppler signal that were graft dependent at the end of the procedure.  The patient experienced blood loss at the end of the procedure when we encountered bleeding within the thigh.  I initially thought that this was from a tunneling issue however it was found that a tie had fallen off of the bypass graft.  I did have to connect to the vein harvest incisions for adequate exposure and repair.  Indications: The patient has a nonhealing left toe wound.  He recently underwent angiography which showed an occluded left iliac system as well as the left superficial femoral and common femoral artery.  He had a stenosis within the right iliac system which was stented.  He comes in today for surgical repair.  Procedure:  The patient was identified in the holding area and taken to [facility] OR ROOM 12  The patient was then placed supine on  the table. general anesthesia was administered.  The patient was prepped and draped in the usual sterile fashion.  A time out was called and antibiotics were administered.  A PA was necessary to expedite the procedure and facilitate the technical aspects of the procedure.  Longitudinal incisions were made in both groins.  The common femoral profundofemoral and superficial femoral arteries were individually isolated bilaterally.  The right groin had an excellent pulse and soft plaque.  The profundofemoral artery on the left was a healthy disease-free artery.  I then proceeded with harvesting of the left saphenous vein through skip incisions down the left leg.  This continued through a below-knee incision.  Through this below-knee incision, I also exposed the below-knee popliteal artery.  I did not divide the anterior tibial vein.  Once adequate exposure was obtained the saphenous vein was removed, ligating it proximally and distally with silk ties.  The vein was prepared on the back table.  It distended nicely and was uniform in caliber measuring about 4 mm.  I then created a tunnel between the below-knee incision and the left groin.  I also created a tunnel between the 2 groin incisions, anterior to the rectus sheath.  The patient was then fully heparinized.  Dr. Arbie Cookey performed the femoral-femoral portion of the procedure.  The right groin anastomosis was performed first.  This was to the distal common femoral artery using 6-0 Prolene suture.  In the left groin, the distal common femoral artery was occluded up to the origin of the profunda.  The superficial femoral artery was also occluded and so  we elected to transect the superficial femoral artery and so the femoral-femoral graft down onto the distal common femoral artery and profundofemoral artery.  There was an excellent pulse within the graft once the clamps were released.  Next, the femoral-femoral graft was occluded as well as the profundofemoral artery.   A graftotomy was made in the hood of the femoral-femoral anastomosis in the left groin.  Tenotomy scissors were used to remove portion of the graft to make sure that there was a good lumen.  The vein was then beveled to fit the size of the graftotomy in a running anastomosis was created with 6-0 Prolene.  Clamps were then released.  A valvulotome was used to lyse the valves within the vein graft.  2 passes were made.  There was excellent flow within the graft at this point.  The vein graft was then brought through the previously created tunnel making sure to maintain proper orientation.  A Weber was placed in the upper thigh followed by tourniquet.  The leg was exsanguinated with an Esmarch.  The tourniquet was taken to 300 mm of pressure.  A 11 blade was then used to make an arteriotomy in the below-knee popliteal artery which was extended longitudinally with Potts scissors.  The leg was then straightened and the vein graft was cut the appropriate length and spatulated to fit the size the arteriotomy.  A running anastomosis was created with 6-0 Prolene.  Prior to completion, the tourniquet was let down.  The appropriate flushing maneuvers were performed and the anastomosis was completed.  The patient had a triphasic signal distal to the graft and graft dependent signals in the peroneal and posterior tibial artery.  The patient's heparin was then reversed with 50 mg of protamine.    At this point we had significant bleeding in the groin.  I occluded the vein graft, and there was still significant bleeding.  Therefore I felt that this was a tunneling issue with the vein.  I explored the tunnel by opening further one of the mid thigh vein harvest incisions.  Once I visualized the tunnel, there was still significant bleeding that I could not visualized and so I elected to connect and open to the vein harvest incisions so that I could better see the tunnel.  Once this was done I realized that a tie had fallen off of  the vein graft.  This was repaired with a 6-0 Prolene.  Once this was done bleeding had resolved.  The patient did receive 3 units of blood.  He temporarily dropped his blood pressure into the 60s however this quickly rebounded.  I inspected all the wounds and they were hemostatic.  There was no further bleeding.  The wounds were then irrigated.  Both groin incisions were closed by reapproximating the fascia over top of the bypass graft.  The subcutaneous tissue was then closed with 3-0 Vicryl and the skin was closed with 3-0 Vicryl.  The vein harvest incisions were closed by reapproximating the fascia with 2-0 Vicryl and the skin with 3-0 Vicryl.  Similarly the below-knee incision was closed by reapproximating the fascia with 2-0 Vicryl the subcutaneous tissue with 3-0 Vicryl, and the skin with 4-0 Vicryl.  We reevaluated the signals in the foot and they were unchanged.  Dermabond was applied to the incisions.  The patient was then successfully extubated and taken the recovery room in stable condition.   Disposition: To PACU stable.   Juleen China, M.D., FACS [facility] Office: [phone] Pager:  [phone]

## 2019-11-13 NOTE — Interval H&P Note (Signed)
History and Physical Interval Note:  11/13/2019 12:28 PM  Benjamin [name]  has presented today for surgery, with the diagnosis of pvd.  The various methods of treatment have been discussed with the patient and family. After consideration of risks, benefits and other options for treatment, the patient has consented to  Procedure(s): BYPASS GRAFT FEMORAL-FEMORAL ARTERY RIGHT TO LEFT (N/A) BYPASS GRAFT FEMORAL-POPLITEAL ARTERY LEFT (Left) as a surgical intervention.  The patient's history has been reviewed, patient examined, no change in status, stable for surgery.  I have reviewed the patient's chart and labs.  Questions were answered to the patient's satisfaction.     [name]  Patient has cardiac clearance.  Plan for fem fem BPG and left fem pop for limb slavage

## 2019-11-13 NOTE — Transfer of Care (Signed)
Immediate Anesthesia Transfer of Care Note  Patient: Benjamin [name]  Procedure(s) Performed: BYPASS GRAFT FEMORAL-FEMORAL ARTERY RIGHT TO LEFT USING HEMASHIELD GOLD GRAFT 9mm x 30cm (N/A Groin) LEFT FEMORAL BELOW KNEE-POPLITEAL ARTERY USING NON-REVERSED GREATER SAPHENOUS VEIN (Left Leg Upper)  Patient Location: PACU  Anesthesia Type:General  Level of Consciousness: awake, alert  and oriented  Airway & Oxygen Therapy: Patient Spontanous Breathing and Patient connected to face mask oxygen  Post-op Assessment: Report given to RN and Post -op Vital signs reviewed and stable  Post vital signs: Reviewed and stable  Last Vitals:  Vitals Value Taken Time  BP 134/89 11/13/19 1819  Temp    Pulse 92 11/13/19 1833  Resp 12 11/13/19 1833  SpO2 95 % 11/13/19 1833  Vitals shown include unvalidated device data.  Last Pain:  Vitals:   11/13/19 0918  TempSrc:   PainSc: 6       Patients Stated Pain Goal: 6 (11/13/19 7116)  Complications: No complications documented.

## 2019-11-13 NOTE — Anesthesia Procedure Notes (Signed)
Arterial Line Insertion Start/End9/22/2021 11:45 AM, 11/13/2019 12:00 PM Performed by: Lanell Matar, CRNA, CRNA  Preanesthetic checklist: patient identified, IV checked, site marked, risks and benefits discussed, surgical consent, monitors and equipment checked, pre-op evaluation, timeout performed and anesthesia consent Lidocaine 1% used for infiltration Left, radial was placed Catheter size: 20 G Hand hygiene performed  and maximum sterile barriers used   Attempts: 1 Procedure performed without using ultrasound guided technique. Following insertion, dressing applied and Biopatch. Post procedure assessment: normal  Patient tolerated the procedure well with no immediate complications.

## 2019-11-13 NOTE — Anesthesia Procedure Notes (Signed)
Procedure Name: Intubation Date/Time: 11/13/2019 12:57 PM Performed by: Glynda Jaeger, CRNA Pre-anesthesia Checklist: Patient identified, Patient being monitored, Timeout performed, Emergency Drugs available and Suction available Patient Re-evaluated:Patient Re-evaluated prior to induction Oxygen Delivery Method: Circle System Utilized Preoxygenation: Pre-oxygenation with 100% oxygen Induction Type: IV induction Ventilation: Mask ventilation without difficulty Laryngoscope Size: Mac and 4 Grade View: Grade I Tube type: Oral Tube size: 7.5 mm Number of attempts: 1 Airway Equipment and Method: Stylet Placement Confirmation: ETT inserted through vocal cords under direct vision,  positive ETCO2 and breath sounds checked- equal and bilateral Secured at: 21 cm Tube secured with: Tape Dental Injury: Teeth and Oropharynx as per pre-operative assessment

## 2019-11-14 ENCOUNTER — Encounter (HOSPITAL_COMMUNITY): Payer: Self-pay | Admitting: Surgery

## 2019-11-14 ENCOUNTER — Inpatient Hospital Stay (HOSPITAL_COMMUNITY): Payer: Managed Care, Other (non HMO)

## 2019-11-14 ENCOUNTER — Inpatient Hospital Stay (HOSPITAL_COMMUNITY): Payer: Managed Care, Other (non HMO) | Admitting: Anesthesiology

## 2019-11-14 ENCOUNTER — Encounter (HOSPITAL_COMMUNITY)
Admission: RE | Disposition: A | Discharge status: Home / Self Care | Payer: Self-pay | Source: Home / Self Care | Attending: Surgery

## 2019-11-14 DIAGNOSIS — I9789 Other postprocedural complications and disorders of the circulatory system, not elsewhere classified: Secondary | ICD-10-CM

## 2019-11-14 DIAGNOSIS — L0390 Cellulitis, unspecified: Secondary | ICD-10-CM

## 2019-11-14 DIAGNOSIS — I739 Peripheral vascular disease, unspecified: Secondary | ICD-10-CM

## 2019-11-14 HISTORY — PX: LOWER EXTREMITY ANGIOGRAM: SHX5508

## 2019-11-14 HISTORY — PX: ENDARTERECTOMY FEMORAL: SHX5804

## 2019-11-14 LAB — POCT I-STAT 7, (LYTES, BLD GAS, ICA,H+H)
Acid-base deficit: 3 mmol/L — ABNORMAL HIGH (ref 0.0–2.0)
Acid-base deficit: 3 mmol/L — ABNORMAL HIGH (ref 0.0–2.0)
Bicarbonate: 25 mmol/L (ref 20.0–28.0)
Bicarbonate: 25 mmol/L (ref 20.0–28.0)
Calcium, Ion: 1.1 mmol/L — ABNORMAL LOW (ref 1.15–1.40)
Calcium, Ion: 1.1 mmol/L — ABNORMAL LOW (ref 1.15–1.40)
HCT: 25 % — ABNORMAL LOW (ref 39.0–52.0)
HCT: 26 % — ABNORMAL LOW (ref 39.0–52.0)
Hemoglobin: 8.5 g/dL — ABNORMAL LOW (ref 13.0–17.0)
Hemoglobin: 8.8 g/dL — ABNORMAL LOW (ref 13.0–17.0)
O2 Saturation: 100 %
O2 Saturation: 100 %
Potassium: 3.4 mmol/L — ABNORMAL LOW (ref 3.5–5.1)
Potassium: 3.6 mmol/L (ref 3.5–5.1)
Sodium: 140 mmol/L (ref 135–145)
Sodium: 140 mmol/L (ref 135–145)
TCO2: 27 mmol/L (ref 22–32)
TCO2: 27 mmol/L (ref 22–32)
pCO2 arterial: 62.8 mmHg — ABNORMAL HIGH (ref 32.0–48.0)
pCO2 arterial: 58.7 mmHg — ABNORMAL HIGH (ref 32.0–48.0)
pH, Arterial: 7.208 — ABNORMAL LOW (ref 7.350–7.450)
pH, Arterial: 7.237 — ABNORMAL LOW (ref 7.350–7.450)
pO2, Arterial: 413 mmHg — ABNORMAL HIGH (ref 83.0–108.0)
pO2, Arterial: 443 mmHg — ABNORMAL HIGH (ref 83.0–108.0)

## 2019-11-14 LAB — CBC
HCT: 26.4 % — ABNORMAL LOW (ref 39.0–52.0)
Hemoglobin: 9.1 g/dL — ABNORMAL LOW (ref 13.0–17.0)
MCH: 29.4 pg (ref 26.0–34.0)
MCHC: 34.5 g/dL (ref 30.0–36.0)
MCV: 85.4 fL (ref 80.0–100.0)
Platelets: 190 10*3/uL (ref 150–400)
RBC: 3.09 MIL/uL — ABNORMAL LOW (ref 4.22–5.81)
RDW: 14.6 % (ref 11.5–15.5)
WBC: 9.9 10*3/uL (ref 4.0–10.5)
nRBC: 0 % (ref 0.0–0.2)

## 2019-11-14 LAB — BASIC METABOLIC PANEL
Anion gap: 10 (ref 5–15)
BUN: 9 mg/dL (ref 6–20)
CO2: 22 mmol/L (ref 22–32)
Calcium: 7.6 mg/dL — ABNORMAL LOW (ref 8.9–10.3)
Chloride: 106 mmol/L (ref 98–111)
Creatinine, Ser: 0.72 mg/dL (ref 0.61–1.24)
GFR calc Af Amer: >60 mL/min (ref >60)
GFR calc non Af Amer: >60 mL/min (ref >60)
Glucose, Bld: 131 mg/dL — ABNORMAL HIGH (ref 70–99)
Potassium: 4.1 mmol/L (ref 3.5–5.1)
Sodium: 138 mmol/L (ref 135–145)

## 2019-11-14 LAB — PREPARE FRESH FROZEN PLASMA: Unit division: 0

## 2019-11-14 LAB — BPAM FFP
Blood Product Expiration Date: 202109252359
Blood Product Expiration Date: 202109252359
ISSUE DATE / TIME: 202109221717
ISSUE DATE / TIME: 202109221717
Unit Type and Rh: 5100
Unit Type and Rh: 5100

## 2019-11-14 LAB — LIPID PANEL
Cholesterol: 97 mg/dL (ref 0–200)
HDL: 35 mg/dL — ABNORMAL LOW (ref >40)
LDL Cholesterol: 47 mg/dL (ref 0–99)
Total CHOL/HDL Ratio: 2.8 RATIO
Triglycerides: 76 mg/dL (ref <150)
VLDL: 15 mg/dL (ref 0–40)

## 2019-11-14 LAB — POCT ACTIVATED CLOTTING TIME
Activated Clotting Time: 230 seconds
Activated Clotting Time: 191 seconds
Activated Clotting Time: 197 seconds

## 2019-11-14 SURGERY — ANGIOGRAM, LOWER EXTREMITY
Anesthesia: General | Site: Leg Lower | Laterality: Left

## 2019-11-14 MED ORDER — LABETALOL HCL 5 MG/ML IV SOLN
INTRAVENOUS | Status: AC
  Filled 2019-11-14: qty 4

## 2019-11-14 MED ORDER — SODIUM CHLORIDE 0.9 % IV SOLN
INTRAVENOUS | Status: DC | PRN
  Administered 2019-11-14: 12:00:00 500 mL

## 2019-11-14 MED ORDER — FENTANYL CITRATE (PF) 250 MCG/5ML IJ SOLN
INTRAMUSCULAR | Status: AC
  Filled 2019-11-14: qty 5

## 2019-11-14 MED ORDER — EPHEDRINE 5 MG/ML INJ
INTRAVENOUS | Status: AC
  Filled 2019-11-14: qty 10

## 2019-11-14 MED ORDER — PROTAMINE SULFATE 10 MG/ML IV SOLN
INTRAVENOUS | Status: AC
  Filled 2019-11-14: qty 25

## 2019-11-14 MED ORDER — ALBUMIN HUMAN 5 % IV SOLN: INTRAVENOUS | Status: DC | PRN

## 2019-11-14 MED ORDER — HEPARIN SODIUM (PORCINE) 1000 UNIT/ML IJ SOLN
INTRAMUSCULAR | Status: DC | PRN
  Administered 2019-11-14: 7000 [IU] via INTRAVENOUS

## 2019-11-14 MED ORDER — ONDANSETRON HCL 4 MG/2ML IJ SOLN
INTRAMUSCULAR | Status: AC
  Filled 2019-11-14: qty 4

## 2019-11-14 MED ORDER — PHENYLEPHRINE 40 MCG/ML (10ML) SYRINGE FOR IV PUSH (FOR BLOOD PRESSURE SUPPORT)
PREFILLED_SYRINGE | INTRAVENOUS | Status: AC
  Filled 2019-11-14: qty 10

## 2019-11-14 MED ORDER — 0.9 % SODIUM CHLORIDE (POUR BTL) OPTIME
TOPICAL | Status: DC | PRN
  Administered 2019-11-14: 2000 mL

## 2019-11-14 MED ORDER — LIDOCAINE 2% (20 MG/ML) 5 ML SYRINGE
INTRAMUSCULAR | Status: DC | PRN
  Administered 2019-11-14: 100 mg via INTRAVENOUS

## 2019-11-14 MED ORDER — CHLORHEXIDINE GLUCONATE 0.12 % MT SOLN
OROMUCOSAL | Status: AC
  Administered 2019-11-14: 15 mL via OROMUCOSAL
  Filled 2019-11-14: qty 15

## 2019-11-14 MED ORDER — CEFAZOLIN SODIUM-DEXTROSE 2-3 GM-%(50ML) IV SOLR
INTRAVENOUS | Status: DC | PRN
  Administered 2019-11-14: 2 g via INTRAVENOUS

## 2019-11-14 MED ORDER — SUGAMMADEX SODIUM 200 MG/2ML IV SOLN
INTRAVENOUS | Status: DC | PRN
  Administered 2019-11-14: 100 mg via INTRAVENOUS

## 2019-11-14 MED ORDER — ROCURONIUM BROMIDE 10 MG/ML (PF) SYRINGE
PREFILLED_SYRINGE | INTRAVENOUS | Status: AC
  Filled 2019-11-14: qty 10

## 2019-11-14 MED ORDER — ROCURONIUM BROMIDE 10 MG/ML (PF) SYRINGE
PREFILLED_SYRINGE | INTRAVENOUS | Status: DC | PRN
  Administered 2019-11-14: 80 mg via INTRAVENOUS

## 2019-11-14 MED ORDER — MIDAZOLAM HCL 2 MG/2ML IJ SOLN
INTRAMUSCULAR | Status: AC
  Filled 2019-11-14: qty 2

## 2019-11-14 MED ORDER — HEMOSTATIC AGENTS (NO CHARGE) OPTIME
TOPICAL | Status: DC | PRN
  Administered 2019-11-14: 1 via TOPICAL

## 2019-11-14 MED ORDER — PROPOFOL 1000 MG/100ML IV EMUL
INTRAVENOUS | Status: AC
  Filled 2019-11-14: qty 100

## 2019-11-14 MED ORDER — ONDANSETRON HCL 4 MG/2ML IJ SOLN
INTRAMUSCULAR | Status: DC | PRN
  Administered 2019-11-14: 4 mg via INTRAVENOUS

## 2019-11-14 MED ORDER — PHENYLEPHRINE 40 MCG/ML (10ML) SYRINGE FOR IV PUSH (FOR BLOOD PRESSURE SUPPORT)
PREFILLED_SYRINGE | INTRAVENOUS | Status: DC | PRN
  Administered 2019-11-14 (×2): 120 ug via INTRAVENOUS

## 2019-11-14 MED ORDER — SODIUM CHLORIDE 0.9 % IV SOLN
INTRAVENOUS | Status: AC
  Filled 2019-11-14: qty 1.2

## 2019-11-14 MED ORDER — LABETALOL HCL 5 MG/ML IV SOLN
INTRAVENOUS | Status: DC | PRN
  Administered 2019-11-14: 5 mg via INTRAVENOUS

## 2019-11-14 MED ORDER — PROMETHAZINE HCL 25 MG/ML IJ SOLN: 6.2500 mg | INTRAMUSCULAR | Status: DC | PRN

## 2019-11-14 MED ORDER — MEPERIDINE HCL 25 MG/ML IJ SOLN: 6.2500 mg | INTRAMUSCULAR | Status: DC | PRN

## 2019-11-14 MED ORDER — CHLORHEXIDINE GLUCONATE 0.12 % MT SOLN: 15.0000 mL | Freq: Once | OROMUCOSAL | Status: DC

## 2019-11-14 MED ORDER — FENTANYL CITRATE (PF) 250 MCG/5ML IJ SOLN
INTRAMUSCULAR | Status: DC | PRN
  Administered 2019-11-14: 25 ug via INTRAVENOUS
  Administered 2019-11-14 (×2): 50 ug via INTRAVENOUS
  Administered 2019-11-14: 125 ug via INTRAVENOUS

## 2019-11-14 MED ORDER — IODIXANOL 320 MG/ML IV SOLN
INTRAVENOUS | Status: DC | PRN
  Administered 2019-11-14: 10 mL

## 2019-11-14 MED ORDER — LIDOCAINE 2% (20 MG/ML) 5 ML SYRINGE
INTRAMUSCULAR | Status: AC
  Filled 2019-11-14: qty 5

## 2019-11-14 MED ORDER — MIDAZOLAM HCL 2 MG/2ML IJ SOLN
INTRAMUSCULAR | Status: DC | PRN
  Administered 2019-11-14 (×2): 1 mg via INTRAVENOUS

## 2019-11-14 MED ORDER — CEFAZOLIN SODIUM 1 G IJ SOLR
INTRAMUSCULAR | Status: AC
  Filled 2019-11-14: qty 20

## 2019-11-14 MED ORDER — ORAL CARE MOUTH RINSE: 15.0000 mL | Freq: Once | OROMUCOSAL | Status: DC

## 2019-11-14 MED ORDER — DEXAMETHASONE SODIUM PHOSPHATE 10 MG/ML IJ SOLN
INTRAMUSCULAR | Status: AC
  Filled 2019-11-14: qty 1

## 2019-11-14 MED ORDER — DEXAMETHASONE SODIUM PHOSPHATE 10 MG/ML IJ SOLN
INTRAMUSCULAR | Status: DC | PRN
  Administered 2019-11-14: 10 mg via INTRAVENOUS

## 2019-11-14 MED ORDER — HYDROMORPHONE HCL 1 MG/ML IJ SOLN: 0.2500 mg | INTRAMUSCULAR | Status: DC | PRN

## 2019-11-14 MED ORDER — HEPARIN SODIUM (PORCINE) 1000 UNIT/ML IJ SOLN
INTRAMUSCULAR | Status: AC
  Filled 2019-11-14: qty 1

## 2019-11-14 MED ORDER — SUCCINYLCHOLINE CHLORIDE 200 MG/10ML IV SOSY
PREFILLED_SYRINGE | INTRAVENOUS | Status: AC
  Filled 2019-11-14: qty 10

## 2019-11-14 MED ORDER — PROTAMINE SULFATE 10 MG/ML IV SOLN
INTRAVENOUS | Status: DC | PRN
  Administered 2019-11-14: 50 mg via INTRAVENOUS

## 2019-11-14 MED ORDER — LACTATED RINGERS IV SOLN: INTRAVENOUS | Status: DC

## 2019-11-14 MED ORDER — PHENYLEPHRINE HCL-NACL 10-0.9 MG/250ML-% IV SOLN
INTRAVENOUS | Status: DC | PRN
  Administered 2019-11-14: 30 ug/min via INTRAVENOUS

## 2019-11-14 MED ORDER — PROPOFOL 10 MG/ML IV BOLUS
INTRAVENOUS | Status: DC | PRN
  Administered 2019-11-14: 100 mg via INTRAVENOUS

## 2019-11-14 SURGICAL SUPPLY — 84 items
BAG BANDED W/RUBBER/TAPE 36X54 (MISCELLANEOUS) ×8 IMPLANT
BAG SNAP BAND KOVER 36X36 (MISCELLANEOUS) ×4 IMPLANT
BALLN STERLING RX 4X40X80 (BALLOONS) ×4
BALLN STERLING RX 5X40X80 (BALLOONS) ×4
BALLOON STERLING RX 4X40X80 (BALLOONS) ×2 IMPLANT
BALLOON STERLING RX 5X40X80 (BALLOONS) ×2 IMPLANT
BLADE SURG 11 STRL SS (BLADE) ×4 IMPLANT
CANISTER SUCT 3000ML PPV (MISCELLANEOUS) ×4 IMPLANT
CATH ANGIO 5F BER2 65CM (CATHETERS) IMPLANT
CATH OMNI FLUSH .035X70CM (CATHETERS) IMPLANT
CHLORAPREP W/TINT 26 (MISCELLANEOUS) IMPLANT
CLIP VESOCCLUDE MED 24/CT (CLIP) ×8 IMPLANT
CLIP VESOCCLUDE SM WIDE 24/CT (CLIP) ×8 IMPLANT
COVER DOME SNAP 22 D (MISCELLANEOUS) ×4 IMPLANT
COVER PROBE W GEL 5X96 (DRAPES) ×4 IMPLANT
COVER SURGICAL LIGHT HANDLE (MISCELLANEOUS) ×4 IMPLANT
COVER WAND RF STERILE (DRAPES) ×4 IMPLANT
DERMABOND ADVANCED (GAUZE/BANDAGES/DRESSINGS) ×2
DERMABOND ADVANCED .7 DNX12 (GAUZE/BANDAGES/DRESSINGS) ×2 IMPLANT
DEVICE INFLATION ENCORE 26 (MISCELLANEOUS) ×4 IMPLANT
DEVICE TORQUE H2O (MISCELLANEOUS) IMPLANT
DRAIN CHANNEL 15F RND FF W/TCR (WOUND CARE) IMPLANT
DRAPE FEMORAL ANGIO 80X135IN (DRAPES) ×4 IMPLANT
DRAPE HALF SHEET 40X57 (DRAPES) ×4 IMPLANT
DRAPE X-RAY CASS 24X20 (DRAPES) IMPLANT
DRSG TEGADERM 4X4.75 (GAUZE/BANDAGES/DRESSINGS) ×4 IMPLANT
ELECT REM PT RETURN 9FT ADLT (ELECTROSURGICAL) ×4
ELECTRODE REM PT RTRN 9FT ADLT (ELECTROSURGICAL) ×2 IMPLANT
EVACUATOR SILICONE 100CC (DRAIN) IMPLANT
GAUZE 4X4 16PLY RFD (DISPOSABLE) ×4 IMPLANT
GLOVE BIOGEL PI IND STRL 6.5 (GLOVE) ×2 IMPLANT
GLOVE BIOGEL PI IND STRL 7.5 (GLOVE) ×2 IMPLANT
GLOVE BIOGEL PI IND STRL 9 (GLOVE) ×2 IMPLANT
GLOVE BIOGEL PI INDICATOR 6.5 (GLOVE) ×2
GLOVE BIOGEL PI INDICATOR 7.5 (GLOVE) ×2
GLOVE BIOGEL PI INDICATOR 9 (GLOVE) ×2
GLOVE SURG SS PI 7.5 STRL IVOR (GLOVE) ×4 IMPLANT
GOWN STRL REUS W/ TWL LRG LVL3 (GOWN DISPOSABLE) ×6 IMPLANT
GOWN STRL REUS W/ TWL XL LVL3 (GOWN DISPOSABLE) ×2 IMPLANT
GOWN STRL REUS W/TWL LRG LVL3 (GOWN DISPOSABLE) ×12
GOWN STRL REUS W/TWL XL LVL3 (GOWN DISPOSABLE) ×4
GUIDEWIRE ANGLED .035X150CM (WIRE) IMPLANT
HEMOSTAT SNOW SURGICEL 2X4 (HEMOSTASIS) IMPLANT
KIT BASIN OR (CUSTOM PROCEDURE TRAY) ×4 IMPLANT
KIT TURNOVER KIT B (KITS) ×4 IMPLANT
NEEDLE PERC 18GX7CM (NEEDLE) ×8 IMPLANT
NS IRRIG 1000ML POUR BTL (IV SOLUTION) ×8 IMPLANT
PACK PERIPHERAL VASCULAR (CUSTOM PROCEDURE TRAY) ×4 IMPLANT
PACK SURGICAL SETUP 50X90 (CUSTOM PROCEDURE TRAY) ×4 IMPLANT
PAD ARMBOARD 7.5X6 YLW CONV (MISCELLANEOUS) ×8 IMPLANT
PROTECTION STATION PRESSURIZED (MISCELLANEOUS) ×4
SET COLLECT BLD 21X3/4 12 (NEEDLE) IMPLANT
SET COLLECT BLD 21X3/4 12 PB (MISCELLANEOUS) ×4 IMPLANT
SET MICROPUNCTURE 5F STIFF (MISCELLANEOUS) ×4 IMPLANT
SET WALTER ACTIVATION W/DRAPE (SET/KITS/TRAYS/PACK) IMPLANT
SHEATH AVANTI 11CM 5FR (SHEATH) IMPLANT
SHEATH PINNACLE 5F 10CM (SHEATH) ×4 IMPLANT
SPONGE INTESTINAL PEANUT (DISPOSABLE) ×4 IMPLANT
STATION PROTECTION PRESSURIZED (MISCELLANEOUS) ×2 IMPLANT
STOPCOCK 4 WAY LG BORE MALE ST (IV SETS) ×4 IMPLANT
STOPCOCK MORSE 400PSI 3WAY (MISCELLANEOUS) ×4 IMPLANT
SURGIFLO W/THROMBIN 8M KIT (HEMOSTASIS) ×4 IMPLANT
SUT ETHILON 3 0 PS 1 (SUTURE) IMPLANT
SUT PROLENE 5 0 C 1 24 (SUTURE) ×4 IMPLANT
SUT PROLENE 6 0 BV (SUTURE) ×12 IMPLANT
SUT SILK 3 0 (SUTURE) ×4
SUT SILK 3-0 18XBRD TIE 12 (SUTURE) ×2 IMPLANT
SUT VIC AB 2-0 CT1 27 (SUTURE) ×4
SUT VIC AB 2-0 CT1 TAPERPNT 27 (SUTURE) ×2 IMPLANT
SUT VIC AB 3-0 SH 27 (SUTURE) ×4
SUT VIC AB 3-0 SH 27X BRD (SUTURE) ×2 IMPLANT
SUT VIC AB 3-0 X1 27 (SUTURE) ×4 IMPLANT
SYR 10ML LL (SYRINGE) ×16 IMPLANT
SYR 20ML LL LF (SYRINGE) ×8 IMPLANT
SYR 30ML LL (SYRINGE) ×4 IMPLANT
SYR MEDRAD MARK V 150ML (SYRINGE) IMPLANT
TAPE STRIPS DRAPE STRL (GAUZE/BANDAGES/DRESSINGS) ×4 IMPLANT
TOWEL GREEN STERILE (TOWEL DISPOSABLE) ×8 IMPLANT
TUBING EXTENTION W/L.L. (IV SETS) ×4 IMPLANT
TUBING HIGH PRESSURE 120CM (CONNECTOR) ×4 IMPLANT
UNDERPAD 30X36 HEAVY ABSORB (UNDERPADS AND DIAPERS) ×4 IMPLANT
WATER STERILE IRR 1000ML POUR (IV SOLUTION) ×4 IMPLANT
WIRE BENTSON .035X145CM (WIRE) ×4 IMPLANT
WIRE G V18X300CM (WIRE) ×4 IMPLANT

## 2019-11-14 NOTE — Progress Notes (Signed)
VASCULAR LAB    Left lower extremity duplex of arterial bypass graft has been performed.  See CV proc for preliminary results.  Called Dr. Randie Heinz with results.   Benjamin [name], RVT 11/14/2019, 9:12 AM

## 2019-11-14 NOTE — Anesthesia Postprocedure Evaluation (Signed)
Anesthesia Post Note  Patient: [name]  Procedure(s) Performed: LEFT LOWER EXTREMITY ANGIOGRAM, BYPASS GRAFT ANGIOPLASTY (Left Groin) Left groin exploration, Redo left femoral artery exposure (Left Leg Lower)     Patient location during evaluation: PACU Anesthesia Type: General Level of consciousness: awake and alert Pain management: pain level controlled Vital Signs Assessment: post-procedure vital signs reviewed and stable Respiratory status: spontaneous breathing, nonlabored ventilation, respiratory function stable and patient connected to nasal cannula oxygen Cardiovascular status: blood pressure returned to baseline and stable Postop Assessment: no apparent nausea or vomiting Anesthetic complications: no   No complications documented.  Last Vitals:  Vitals:   11/14/19 1557 11/14/19 1605  BP: 140/78   Pulse:    Resp: 13   Temp:    SpO2: 94% 96%    Last Pain:  Vitals:   11/14/19 1532  TempSrc: Oral  PainSc:                  Benjamin [name]

## 2019-11-14 NOTE — Discharge Instructions (Signed)
 [facility]  Discharge instructions  Lower Extremity Bypass Surgery  Please refer to the following instruction for your post-procedure care. Your surgeon or physician assistant will discuss any changes with you.  Activity  You are encouraged to walk as much as you can. You can slowly return to normal activities during the month after your surgery. Avoid strenuous activity and heavy lifting until your doctor tells you it's OK. Avoid activities such as vacuuming or swinging a golf club. Do not drive until your doctor give the OK and you are no longer taking prescription pain medications. It is also normal to have difficulty with sleep habits, eating and bowel movement after surgery. These will go away with time.  Bathing/Showering  Shower daily after you go home. Do not soak in a bathtub, hot tub, or swim until the incision heals completely.  Incision Care  Clean your incision with mild soap and water. Shower every day. Pat the area dry with a clean towel. You do not need a bandage unless otherwise instructed. Do not apply any ointments or creams to your incision. If you have open wounds you will be instructed how to care for them or a visiting nurse may be arranged for you. If you have staples or sutures along your incision they will be removed at your post-op appointment. You may have skin glue on your incision. Do not peel it off. It will come off on its own in about one week.  Wash the groin wound with soap and water daily and pat dry. (No tub bath-only shower)  Then put a dry gauze or washcloth in the groin to keep this area dry to help prevent wound infection.  Do this daily and as needed.  Do not use Vaseline or neosporin on your incisions.  Only use soap and water on your incisions and then protect and keep dry.  Diet  Resume your normal diet. There are no special food restrictions following this procedure. A low fat/ low cholesterol diet is  recommended for all patients with vascular disease. In order to heal from your surgery, it is CRITICAL to get adequate nutrition. Your body requires vitamins, minerals, and protein. Vegetables are the best source of vitamins and minerals. Vegetables also provide the perfect balance of protein. Processed food has little nutritional value, so try to avoid this.  Medications  Resume taking all your medications unless your doctor or physician assistant tells you not to. If your incision is causing pain, you may take over-the-counter pain relievers such as acetaminophen (Tylenol). If you were prescribed a stronger pain medication, please aware these medication can cause nausea and constipation. Prevent nausea by taking the medication with a snack or meal. Avoid constipation by drinking plenty of fluids and eating foods with high amount of fiber, such as fruits, vegetables, and grains. Take Colace 100 mg (an over-the-counter stool softener) twice a day as needed for constipation.  Do not take Tylenol if you are taking prescription pain medications.  Follow Up  Our office will schedule a follow up appointment 2-3 weeks following discharge.  Please call us immediately for any of the following conditions  Severe or worsening pain in your legs or feet while at rest or while walking Increase pain, redness, warmth, or drainage (pus) from your incision site(s) Fever of 101 degree or higher The swelling in your leg with the bypass suddenly worsens and becomes more painful than when you were in the hospital If you have   been instructed to feel your graft pulse then you should do so every day. If you can no longer feel this pulse, call the office immediately. Not all patients are given this instruction.  Leg swelling is common after leg bypass surgery.  The swelling should improve over a few months following surgery. To improve the swelling, you may elevate your legs above the level of your heart while you are  sitting or resting. Your surgeon or physician assistant may ask you to apply an ACE wrap or wear compression (TED) stockings to help to reduce swelling.  Reduce your risk of vascular disease  Stop smoking. If you would like help call [facility] at 1-800-QUIT-NOW (1-800-784-8669) or [facility] at 336-586-4000.  Manage your cholesterol Maintain a desired weight Control your diabetes weight Control your diabetes Keep your blood pressure down  If you have any questions, please call the office at [phone]  

## 2019-11-14 NOTE — Progress Notes (Addendum)
  Progress Note    11/14/2019 7:48 AM 1 Day Post-Op  Subjective:  L foot throbbing with change in motor function in the past 30 minutes   Vitals:   11/14/19 0647 11/14/19 0739  BP: (!) 164/91 (!) 159/103  Pulse: 86 91  Resp: 16 17  Temp: 97.6 F (36.4 C) 98.5 F (36.9 C)  SpO2: 100% 100%   Physical Exam: Lungs:  Non labored Incisions:  Groin incisions c/d/i; proximal most vein harvest incision with sanguinous ooz Extremities:  2 + femoral pulses; palpable fem fem bypass pulse; brisk R PT doppler signal; venous like peroneal doppler signal L foot Neurologic: A&O  CBC    Component Value Date/Time   WBC 9.9 11/14/2019 0508   RBC 3.09 (L) 11/14/2019 0508   HGB 9.1 (L) 11/14/2019 0508   HGB 14.9 05/15/2019 1117   HCT 26.4 (L) 11/14/2019 0508   HCT 41.4 05/15/2019 1117   PLT 190 11/14/2019 0508   PLT 262 05/15/2019 1117   MCV 85.4 11/14/2019 0508   MCV 86 05/15/2019 1117   MCH 29.4 11/14/2019 0508   MCHC 34.5 11/14/2019 0508   RDW 14.6 11/14/2019 0508   RDW 13.9 05/15/2019 1117   LYMPHSABS 1,360 06/24/2016 1024   MONOABS 480 06/24/2016 1024   EOSABS 80 06/24/2016 1024   BASOSABS 0 06/24/2016 1024    BMET    Component Value Date/Time   NA 138 11/14/2019 0508   NA 140 10/01/2019 1041   K 4.1 11/14/2019 0508   CL 106 11/14/2019 0508   CO2 22 11/14/2019 0508   GLUCOSE 131 (H) 11/14/2019 0508   BUN 9 11/14/2019 0508   BUN 11 10/01/2019 1041   CREATININE 0.72 11/14/2019 0508   CREATININE 1.02 01/20/2017 0908   CALCIUM 7.6 (L) 11/14/2019 0508   GFRNONAA >60 11/14/2019 0508   GFRAA >60 11/14/2019 0508    INR    Component Value Date/Time   INR 1.1 11/13/2019 0848     Intake/Output Summary (Last 24 hours) at 11/14/2019 0748 Last data filed at 11/14/2019 0645 Gross per 24 hour  Intake 5977.33 ml  Output 4625 ml  Net 1352.33 ml     Assessment/Plan:  59 y.o. male is s/p R to L fem fem and L fem pop bypass with vein 1 Day Post-Op   Change in L foot pain  and motor function this morning; soft venous like PT by doppler Fem fem bypass patent with palpable pulse Check L LE arterial duplex for bypass patency Keep NPO this morning   Emilie Rutter, PA-C [facility] [phone] 11/14/2019 7:48 AM  I agree with the above.  I have seen and evaluated the patient.  He recently had a ultrasound of his bypass graft which shows a 70% stenosis in the mid graft.  I think this needs to be explored in the operating room.  We will plan on doing that this morning.  [name]

## 2019-11-14 NOTE — Anesthesia Preprocedure Evaluation (Signed)
Anesthesia Evaluation    Reviewed: Allergy & Precautions, H&P , Patient's Chart, lab work & pertinent test results, Unable to perform ROS - Chart review only  Airway Mallampati: II  TM Distance: >3 FB Neck ROM: Full    Dental  (+) Edentulous Upper, Edentulous Lower, Dental Advisory Given   Pulmonary Current SmokerPatient did not abstain from smoking.,    Pulmonary exam normal breath sounds clear to auscultation       Cardiovascular hypertension, Pt. on medications + Peripheral Vascular Disease   Rhythm:Regular Rate:Normal     Neuro/Psych negative neurological ROS  negative psych ROS   GI/Hepatic negative GI ROS, Neg liver ROS,   Endo/Other  negative endocrine ROS  Renal/GU negative Renal ROS  negative genitourinary   Musculoskeletal   Abdominal   Peds  Hematology negative hematology ROS (+)   Anesthesia Other Findings   Reproductive/Obstetrics negative OB ROS                             Anesthesia Physical  Anesthesia Plan  ASA: III  Anesthesia Plan: General   Post-op Pain Management:    Induction: Intravenous  PONV Risk Score and Plan: 2 and Ondansetron, Dexamethasone and Midazolam  Airway Management Planned: Oral ETT  Additional Equipment:   Intra-op Plan:   Post-operative Plan: Extubation in OR  Informed Consent:   Plan Discussed with:   Anesthesia Plan Comments: (PAT note written 11/12/2019 by Shonna Chock, PA-C. )        Anesthesia Quick Evaluation

## 2019-11-14 NOTE — Transfer of Care (Signed)
Immediate Anesthesia Transfer of Care Note  Patient: Benjamin [name]  Procedure(s) Performed: LEFT LOWER EXTREMITY ANGIOGRAM, BYPASS GRAFT ANGIOPLASTY (Left Groin) Left groin exploration, Redo left femoral artery exposure (Left Leg Lower)  Patient Location: PACU  Anesthesia Type:General  Level of Consciousness: drowsy and patient cooperative  Airway & Oxygen Therapy: Patient Spontanous Breathing  Post-op Assessment: Report given to RN, Post -op Vital signs reviewed and stable and Patient moving all extremities X 4  Post vital signs: Reviewed and stable  Last Vitals:  Vitals Value Taken Time  BP 138/92 11/14/19 1411  Temp    Pulse 98 11/14/19 1412  Resp 11 11/14/19 1412  SpO2 94 % 11/14/19 1412  Vitals shown include unvalidated device data.  Last Pain:  Vitals:   11/14/19 1123  TempSrc:   PainSc: 7       Patients Stated Pain Goal: 3 (11/14/19 1123)  Complications: No complications documented.

## 2019-11-14 NOTE — Progress Notes (Signed)
A-line removed at this time. Catheter intact. Pt tolerated well. Foley removed at this time. Pt tolerated well. Pt due to void by 1130. Will continue to monitor.  Willa Frater, RN

## 2019-11-14 NOTE — Op Note (Signed)
    Patient name: Benjamin [name] MRN: [id number] DOB: April 28, 1960 Sex: male  11/14/2019 Pre-operative Diagnosis: Bypass graft stenosis Post-operative diagnosis:  Same Surgeon:  [name] Assistants: None Procedure:   #1: Reopening of left femoral incision and redo exposure of left femoral artery   #2: Left lower extremity angiogram   #3: Balloon angioplasty of left femoral-popliteal bypass graft Anesthesia: General Blood Loss: Minimal Specimens: None  Findings: A mid bypass graft stenosis was identified which correlated with the high-grade velocities seen on ultrasound.  Balloon angioplasty was performed using a 5 mm balloon.  The bypass graft was widely patent with single-vessel runoff via the peroneal artery  Indications: This is a 59 year old gentleman who yesterday underwent right to left femoral-femoral bypass graft followed by a left femoral-popliteal bypass graft.  He had diminished Doppler signals this morning and so an ultrasound was performed which showed a focal area of high-grade stenosis.  I felt the best course of action was to evaluate this in the operating room with angiography and possible intervention.  Procedure:  The patient was identified in the holding area and taken to [facility] OR ROOM 16  The patient was then placed supine on the table. general anesthesia was administered.  The patient was prepped and draped in the usual sterile fashion.  A time out was called and antibiotics were administered.  The patient's previous left femoral incision was opened with Metzenbaum scissors.  Sutures were removed and the bypass graft as well as the femoral-femoral crossover graft were exposed.  A butterfly needle was inserted into the hood of the bypass graft and left leg runoff was performed.  This showed that the bypass graft was widely patent.  In the midportion there was an area of luminal irregularity which potentially represented a valve.  The distal anastomosis was widely patent and there  was single-vessel runoff.  Because the area of concern showed high velocities on ultrasound, I felt that intervention was warranted.  I then inserted a 5 French sheath into the hood of the bypass graft.  The patient was fully heparinized.  I used a V 18 to cross the lesion.  I did have some difficulty passing the wire through the lesion but was ultimately successful.  I then performed balloon angioplasty using a 4 mm balloon.  There still appeared to be some residual narrowing so this was upsized to a 5 x 40 Sterling balloon.  Balloon angioplasty was performed up to rated pressure.  Subsequent imaging showed improvement of the narrowing.  The sheath was then removed and the venotomy was closed with a 6-0 Prolene.  The heparin was then reversed with 50 mg of protamine.  The groin was then copiously irrigated.  Hemostasis was achieved.  The fascia was then reapproximated with running 2-0 Vicryl.  The subcutaneous tissue was then closed with 3-0 Vicryl followed by subcuticular closure and Dermabond.  There were no immediate complications.   Disposition: To PACU stable.   Juleen China, M.D., [facility] [place] Office: [phone] Pager:  [phone]

## 2019-11-14 NOTE — Progress Notes (Signed)
PT Cancellation Note  Patient Details Name: Benjamin [name] MRN: [id number] DOB: 02-11-1961   Cancelled Treatment:    Reason Eval/Treat Not Completed: Other (comment) (per RN pt to leave for procedure imminently and asked to hold until after)   Lexine Jaspers B Krishika Bugge 11/14/2019, 8:10 AM  Merryl Hacker, PT Acute Rehabilitation Services Pager: [phone] Office: [phone]

## 2019-11-14 NOTE — Progress Notes (Signed)
Pt arrived to 4E-21 from PACU. CHG bath completed, new gown applied. Tele applied and CCMD notified. Pulses faint with doppler. VS stable. Will continue to monitor.  Willa Frater, RN

## 2019-11-14 NOTE — Anesthesia Procedure Notes (Signed)
Procedure Name: Intubation Date/Time: 11/14/2019 12:45 PM Performed by: Darletta Moll, CRNA Pre-anesthesia Checklist: Patient identified, Emergency Drugs available, Suction available and Patient being monitored Patient Re-evaluated:Patient Re-evaluated prior to induction Oxygen Delivery Method: Circle system utilized Preoxygenation: Pre-oxygenation with 100% oxygen Induction Type: IV induction Ventilation: Mask ventilation without difficulty and Oral airway inserted - appropriate to patient size Laryngoscope Size: Mac and 4 Grade View: Grade I Tube type: Oral Tube size: 7.5 mm Number of attempts: 1 Airway Equipment and Method: Stylet and Oral airway Placement Confirmation: ETT inserted through vocal cords under direct vision,  positive ETCO2 and breath sounds checked- equal and bilateral Secured at: 23 cm Tube secured with: Tape Dental Injury: Teeth and Oropharynx as per pre-operative assessment

## 2019-11-14 NOTE — Anesthesia Postprocedure Evaluation (Signed)
Anesthesia Post Note  Patient: Benjamin [name]  Procedure(s) Performed: BYPASS GRAFT FEMORAL-FEMORAL ARTERY RIGHT TO LEFT USING HEMASHIELD GOLD GRAFT 69mm x 30cm (N/A Groin) LEFT FEMORAL BELOW KNEE-POPLITEAL ARTERY USING NON-REVERSED GREATER SAPHENOUS VEIN (Left Leg Upper)     Patient location during evaluation: Other Anesthesia Type: General Level of consciousness: awake and alert Pain management: pain level controlled Vital Signs Assessment: post-procedure vital signs reviewed and stable Respiratory status: spontaneous breathing, nonlabored ventilation and respiratory function stable Cardiovascular status: blood pressure returned to baseline and stable Postop Assessment: no apparent nausea or vomiting Anesthetic complications: no   No complications documented.  Last Vitals:  Vitals:   11/14/19 0739 11/14/19 1055  BP: (!) 159/103 140/90  Pulse: 91 97  Resp: 17 19  Temp: 36.9 C 37.1 C  SpO2: 100% 96%    Last Pain:  Vitals:   11/14/19 1055  TempSrc: Oral  PainSc:                  Khush Pasion,W. EDMOND

## 2019-11-14 NOTE — Progress Notes (Signed)
Pt arrived from unit from PACU. VSS.Will continue to monitor. Pt. Oriented to unit  Bilateral lower pulses doppler    Everlean Cherry, RN

## 2019-11-14 NOTE — Progress Notes (Signed)
OT Cancellation Note  Patient Details Name: ATANACIO MELNYK MRN: [id number] DOB: 1960-05-01   Cancelled Treatment:    Reason Eval/Treat Not Completed: Patient at procedure or test/ unavailable. per RN pt to leave for procedure imminently and asked to hold until after. OT will continue to follow for evaluation  Emelda Fear 11/14/2019, 8:16 AM   Nyoka Cowden OTR/L Acute Rehabilitation Services Pager: [phone] Office: [phone]

## 2019-11-14 NOTE — Anesthesia Procedure Notes (Signed)
Arterial Line Insertion Start/End9/23/2021 11:30 AM Performed by: Alease Medina, CRNA  Patient location: Pre-op. Preanesthetic checklist: patient identified, IV checked, site marked, risks and benefits discussed, surgical consent, monitors and equipment checked, pre-op evaluation, timeout performed and anesthesia consent Lidocaine 1% used for infiltration Left, radial was placed Catheter size: 20 Fr Hand hygiene performed  and maximum sterile barriers used   Attempts: 1 Procedure performed without using ultrasound guided technique. Following insertion, dressing applied and Biopatch. Post procedure assessment: normal and unchanged  Patient tolerated the procedure well with no immediate complications.

## 2019-11-15 ENCOUNTER — Inpatient Hospital Stay (HOSPITAL_COMMUNITY): Payer: Managed Care, Other (non HMO)

## 2019-11-15 ENCOUNTER — Encounter (HOSPITAL_COMMUNITY): Payer: Managed Care, Other (non HMO)

## 2019-11-15 ENCOUNTER — Encounter (HOSPITAL_COMMUNITY): Payer: Self-pay | Admitting: Surgery

## 2019-11-15 DIAGNOSIS — I739 Peripheral vascular disease, unspecified: Secondary | ICD-10-CM

## 2019-11-15 DIAGNOSIS — L0390 Cellulitis, unspecified: Secondary | ICD-10-CM

## 2019-11-15 LAB — BPAM RBC
Blood Product Expiration Date: 202110212359
Blood Product Expiration Date: 202110242359
Blood Product Expiration Date: 202110252359
Blood Product Expiration Date: 202110252359
Blood Product Expiration Date: 202110252359
Blood Product Expiration Date: 202110252359
ISSUE DATE / TIME: 202109221508
ISSUE DATE / TIME: 202109221654
ISSUE DATE / TIME: 202109221654
ISSUE DATE / TIME: 202109221710
ISSUE DATE / TIME: 202109221710
Unit Type and Rh: 5100
Unit Type and Rh: 5100
Unit Type and Rh: 5100
Unit Type and Rh: 5100
Unit Type and Rh: 5100
Unit Type and Rh: 5100

## 2019-11-15 LAB — TYPE AND SCREEN
ABO/RH(D): O POS
Antibody Screen: NEGATIVE
Unit division: 0
Unit division: 0
Unit division: 0
Unit division: 0
Unit division: 0
Unit division: 0

## 2019-11-15 MED ORDER — ATORVASTATIN CALCIUM 80 MG PO TABS
80.0000 mg | ORAL_TABLET | Freq: Every day | ORAL | Status: DC
  Administered 2019-11-16 – 2019-11-17 (×2): 80 mg via ORAL
  Filled 2019-11-15 (×2): qty 1

## 2019-11-15 MED ORDER — CLOPIDOGREL BISULFATE 75 MG PO TABS
75.0000 mg | ORAL_TABLET | Freq: Every day | ORAL | Status: DC
  Administered 2019-11-15 – 2019-11-17 (×3): 75 mg via ORAL
  Filled 2019-11-15 (×3): qty 1

## 2019-11-15 NOTE — Progress Notes (Addendum)
Progress Note    11/15/2019 7:32 AM 1 Day Post-Op  Subjective: Pain controlled.  Still with complaints of "stiffness" of left lower leg.  No chest pain, shortness of breath or nausea.  Currently with condom catheter in place   Vitals:   11/14/19 2355 11/15/19 0351  BP: 132/84 137/85  Pulse: 80 72  Resp: 16 13  Temp: 98.3 F (36.8 C) 98.5 F (36.9 C)  SpO2: 100% 98%    Physical Exam: Cardiac: Heart rate and rhythm are regular Lungs: Clear to auscultation bilaterally Incisions: Lateral groin and left lower leg incisions are all well approximated without bleeding or hematoma. Extremities: Palpable pulses in femorofemoral graft.  Decreased motor function left foot. Left foot ulcer dry and stable. Sensation intact.  He has brisk peroneal, anterior tibial and posterior tibial Doppler signals. Abdomen: Soft, nondistended  CBC    Component Value Date/Time   WBC 9.9 11/14/2019 0508   RBC 3.09 (L) 11/14/2019 0508   HGB 9.1 (L) 11/14/2019 0508   HGB 14.9 05/15/2019 1117   HCT 26.4 (L) 11/14/2019 0508   HCT 41.4 05/15/2019 1117   PLT 190 11/14/2019 0508   PLT 262 05/15/2019 1117   MCV 85.4 11/14/2019 0508   MCV 86 05/15/2019 1117   MCH 29.4 11/14/2019 0508   MCHC 34.5 11/14/2019 0508   RDW 14.6 11/14/2019 0508   RDW 13.9 05/15/2019 1117   LYMPHSABS 1,360 06/24/2016 1024   MONOABS 480 06/24/2016 1024   EOSABS 80 06/24/2016 1024   BASOSABS 0 06/24/2016 1024    BMET    Component Value Date/Time   NA 138 11/14/2019 0508   NA 140 10/01/2019 1041   K 4.1 11/14/2019 0508   CL 106 11/14/2019 0508   CO2 22 11/14/2019 0508   GLUCOSE 131 (H) 11/14/2019 0508   BUN 9 11/14/2019 0508   BUN 11 10/01/2019 1041   CREATININE 0.72 11/14/2019 0508   CREATININE 1.02 01/20/2017 0908   CALCIUM 7.6 (L) 11/14/2019 0508   GFRNONAA >60 11/14/2019 0508   GFRAA >60 11/14/2019 0508     Intake/Output Summary (Last 24 hours) at 11/15/2019 0732 Last data filed at 11/15/2019 0425 Gross per  24 hour  Intake 1927.72 ml  Output 2520 ml  Net -592.28 ml    HOSPITAL MEDICATIONS Scheduled Meds: . sodium chloride   Intravenous Once  . amLODipine  5 mg Oral Daily  . atorvastatin  20 mg Oral Daily  . collagenase  1 application Topical Daily  . docusate sodium  100 mg Oral Daily  . gabapentin  100 mg Oral TID  . heparin  5,000 Units Subcutaneous Q8H  . multivitamin with minerals  1 tablet Oral Daily  . pantoprazole  40 mg Oral Daily  . varenicline  1 mg Oral BID   Continuous Infusions: . sodium chloride    . sodium chloride 75 mL/hr at 11/14/19 2143  . magnesium sulfate bolus IVPB     PRN Meds:.sodium chloride, acetaminophen **OR** acetaminophen, alum & mag hydroxide-simeth, bisacodyl, guaiFENesin-dextromethorphan, hydrALAZINE, HYDROcodone-acetaminophen, labetalol, magnesium sulfate bolus IVPB, metoprolol tartrate, morphine injection, ondansetron, phenol, polyethylene glycol, potassium chloride  Assessment: POD 2 right to left femorofemoral bypass and left femoral to below-knee popliteal artery bypass with vein.  Return to the OR proxy 24 hours later secondary to change in left foot motor function.  No significant intra-op findings. Hgb is stable.  Plan:  -Out of bed today and work with PT/OT. -DVT prophylaxis: Heparin subcu   SANDRA SETZER, PA-C [facility] [phone] 11/15/2019  7:32 AM   I agree with the above.  I have seen and evaluated patient.  He is postoperative day #2 from a right to left femoral-femoral bypass graft and a left femoral to below-knee popliteal bypass graft with vein.  He is postoperative day #1 from balloon angioplasty of his vein graft.  He has a brisk peroneal and posterior tibial Doppler signal today.  He has an allergy to aspirin.  He is on a statin.  Continue with therapy.  Hopefully home over the weekend.  [name]

## 2019-11-15 NOTE — Progress Notes (Signed)
Occupational Therapy Evaluation  Patient with functional deficits listed below impacting safety and independence with self care. Patient lives at home with brother and sister who are retired, independent at baseline. Currently patient requires mod A for LB dressing, min G assist for toilet transfer and supervision for g/h standing at sink. Patient initially min A with functional ambulation due to unsteadiness, with rolling walker patient improve to min G. Patient has darco shoe from home "they told me to wear it" and required assist to don to L foot. Recommend continued acute OT services for ADL and transfer training, improved balance and activity tolerance in order to facilitate D/C to venue listed below.    11/15/19 1224  OT Visit Information  Last OT Received On 11/15/19  Assistance Needed +1  History of Present Illness 59 yo admitted for LE claudication s/p left fem pop BPG and Right to left fem-fem BPG on 9/22. 9/23 reexploration of left groin. PMhx: PVD, dyslipidemia  Precautions  Precautions Fall  Precaution Comments pt has darco shoe for L LE   Restrictions  Weight Bearing Restrictions No  Home Living  Family/patient expects to be discharged to: Private residence  Living Arrangements Other relatives  Available Help at Discharge Family;Available 24 hours/day  Type of Home House  Home Access Stairs to enter  Entrance Stairs-Number of Steps 8  Entrance Stairs-Rails Right;Left;Can reach both  Home Layout One level  Bathroom Shower/Tub Tub/shower unit  Tour manager None  Prior Function  Level of Independence Independent  Communication  Communication No difficulties  Pain Assessment  Pain Assessment 0-10  Pain Score 4  Pain Location left foot   Pain Descriptors / Indicators Sore  Pain Intervention(s) Monitored during session  Cognition  Arousal/Alertness Awake/alert  Behavior During Therapy WFL for tasks assessed/performed  Overall Cognitive  Status Within Functional Limits for tasks assessed  Upper Extremity Assessment  Upper Extremity Assessment Overall WFL for tasks assessed  Lower Extremity Assessment  Lower Extremity Assessment Defer to PT evaluation  Cervical / Trunk Assessment  Cervical / Trunk Assessment Normal  ADL  Overall ADL's  Needs assistance/impaired  Grooming Wash/dry hands;Supervision/safety;Standing  Upper Body Bathing Set up;Sitting  Lower Body Bathing Minimal assistance;Sit to/from stand  Upper Body Dressing  Set up;Sitting  Lower Body Dressing Moderate assistance;Sitting/lateral leans  Lower Body Dressing Details (indicate cue type and reason) patient able to don shoe to R foot, require assist to don darco shoe and hospital sock to L foot  Toilet Transfer Min guard;Cueing for safety;Ambulation;Regular Toilet;Grab bars  Toilet Transfer Details (indicate cue type and reason) patient ambulate with min A to bathroom without AD, min G assist with rolling walker from bathroom to recliner, min cues for safety  Toileting- Clothing Manipulation and Hygiene Supervision/safety;Sitting/lateral lean;Sit to/from stand  Functional mobility during ADLs Min guard;Rolling walker;Cueing for safety  General ADL Comments patient requiring increased assistance for self care tasks due to pain, decreased activity tolerance, balance, safety awareness  Bed Mobility  Overal bed mobility Needs Assistance  Bed Mobility Supine to Sit  Supine to sit Supervision;HOB elevated  General bed mobility comments for safety  Transfers  Overall transfer level Needs assistance  Equipment used Rolling walker (2 wheeled);None  Transfers Sit to/from Stand  Sit to [employer] guard  General transfer comment min guard for safety to stand from edge of bed without AD, min guard for toilet transfer for safety and cue to utilize grab bar for eccentric control onto toilet  Balance  Overall balance assessment Needs assistance  Sitting-balance support Feet  supported  Sitting balance-Leahy Scale Good  Standing balance support No upper extremity supported  Standing balance-Leahy Scale Fair  Standing balance comment static stand without UE support, increased safety with walker for ambulation  General Comments  General comments (skin integrity, edema, etc.) patient maintain above 90% on room air throughout session, RN made aware  OT - End of Session  Equipment Utilized During Treatment Rolling walker  Activity Tolerance Patient tolerated treatment well  Patient left in chair;with call bell/phone within reach;with chair alarm set  Nurse Communication Mobility status;Other (comment) (O2 on room air)  OT Assessment  OT Recommendation/Assessment Patient needs continued OT Services  OT Visit Diagnosis Unsteadiness on feet (R26.81);Pain  Pain - Right/Left Left  Pain - part of body Leg;Ankle and joints of foot  OT Problem List Decreased activity tolerance;Impaired balance (sitting and/or standing);Decreased safety awareness;Decreased knowledge of use of DME or AE;Pain  OT Plan  OT Frequency (ACUTE ONLY) Min 2X/week  OT Treatment/Interventions (ACUTE ONLY) Self-care/ADL training;DME and/or AE instruction;Therapeutic activities;Patient/family education;Balance training  AM-PAC OT "6 Clicks" Daily Activity Outcome Measure (Version 2)  Help from another person eating meals? 4  Help from another person taking care of personal grooming? 3  Help from another person toileting, which includes using toliet, bedpan, or urinal? 3  Help from another person bathing (including washing, rinsing, drying)? 3  Help from another person to put on and taking off regular upper body clothing? 3  Help from another person to put on and taking off regular lower body clothing? 2  6 Click Score 18  OT Recommendation  Follow Up Recommendations Home health OT;Supervision - Intermittent  OT Equipment Other (comment) (rolling walker)  Individuals Consulted  Consulted and Agree  with Results and Recommendations Patient  Acute Rehab OT Goals  Patient Stated Goal use the bathroom  OT Goal Formulation With patient  Time For Goal Achievement 11/29/19  Potential to Achieve Goals Good  OT Time Calculation  OT Start Time (ACUTE ONLY) 1006  OT Stop Time (ACUTE ONLY) 1045  OT Time Calculation (min) 39 min  OT General Charges  $OT Visit 1 Visit  OT Evaluation  $OT Eval Low Complexity 1 Low  OT Treatments  $Self Care/Home Management  23-37 mins  Written Expression  Dominant Hand Right   Marlyce Huge OT OT pager: [phone]

## 2019-11-15 NOTE — Progress Notes (Signed)
PT Cancellation Note  Patient Details Name: Benjamin [name] MRN: [id number] DOB: 10-Sep-1960   Cancelled Treatment:    Reason Eval/Treat Not Completed: Other (comment) (pt eating breakfast, will return)   Estel Scholze B Broghan Pannone 11/15/2019, 8:44 AM   Merryl Hacker, PT Acute Rehabilitation Services Pager: [phone] Office: [phone]

## 2019-11-15 NOTE — Evaluation (Signed)
Physical Therapy Evaluation Patient Details Name: Benjamin [name] MRN: [id number] DOB: 01-16-1961 Today's Date: 11/15/2019   History of Present Illness  59 yo admitted for LE claudication s/p left fem pop BPG and Right to left fem-fem BPG on 9/22. 9/23 reexploration of left groin. PMhx: PVD, dyslipidemia  Clinical Impression  Pt sitting in chair on arrival and eager to walk. Pt reports limited pain in LLE with decreased dorsiflexion. Pt educated for RW use, transfers, gait and HEP. Pt with decreased activity tolerance and mobility who will benefit from acute therapy to maximize function and independence.      Follow Up Recommendations No PT follow up    Equipment Recommendations  Rolling walker with 5" wheels    Recommendations for Other Services       Precautions / Restrictions Precautions Precautions: Fall Precaution Comments: pt has darco shoe for L LE  Required Braces or Orthoses: Other Brace Other Brace: Darco LLE Restrictions Weight Bearing Restrictions: No      Mobility  Bed Mobility Overal bed mobility: Needs Assistance Bed Mobility: Supine to Sit     Supine to sit: Supervision;HOB elevated     General bed mobility comments: pt in chair on arrival  Transfers Overall transfer level: Needs assistance Equipment used: Rolling walker (2 wheeled);None Transfers: Sit to/from Stand Sit to Stand: Supervision         General transfer comment: cues for hand placement  Ambulation/Gait Ambulation/Gait assistance: Supervision Gait Distance (Feet): 400 Feet Assistive device: Rolling walker (2 wheeled) Gait Pattern/deviations: Step-to pattern;Decreased stride length;Trunk flexed   Gait velocity interpretation: >2.62 ft/sec, indicative of community ambulatory General Gait Details: cues for posture, sequence and safety with pt with limited step through pattern  Stairs Stairs: Yes Stairs assistance: Supervision Stair Management: Two rails;Alternating  pattern;Forwards Number of Stairs: 5 General stair comments: pt able to ascend stairs safely with assist of railings  Wheelchair Mobility    Modified Rankin (Stroke Patients Only)       Balance Overall balance assessment: Mild deficits observed, not formally tested Sitting-balance support: Feet supported Sitting balance-Leahy Scale: Good     Standing balance support: No upper extremity supported Standing balance-Leahy Scale: Fair Standing balance comment: static stand without UE support, increased safety with walker for ambulation                             Pertinent Vitals/Pain Pain Assessment: 0-10 Pain Score: 3  Pain Location: LLE Pain Descriptors / Indicators: Aching;Sore Pain Intervention(s): Limited activity within patient's tolerance;Monitored during session;Patient requesting pain meds-RN notified;Repositioned    Home Living Family/patient expects to be discharged to:: Private residence Living Arrangements: Other relatives Available Help at Discharge: Family;Available 24 hours/day Type of Home: House Home Access: Stairs to enter Entrance Stairs-Rails: Right;Left;Can reach both Entrance Stairs-Number of Steps: 8 Home Layout: One level Home Equipment: None      Prior Function Level of Independence: Independent               Hand Dominance   Dominant Hand: Right    Extremity/Trunk Assessment   Upper Extremity Assessment Upper Extremity Assessment: Overall WFL for tasks assessed    Lower Extremity Assessment Lower Extremity Assessment: LLE deficits/detail LLE Deficits / Details: decreased dorsiflexion 2-/5 with PROM WFL    Cervical / Trunk Assessment Cervical / Trunk Assessment: Normal  Communication   Communication: No difficulties  Cognition Arousal/Alertness: Awake/alert Behavior During Therapy: WFL for tasks assessed/performed Overall Cognitive Status:  Within Functional Limits for tasks assessed                                         General Comments General comments (skin integrity, edema, etc.): patient maintain above 90% on room air throughout session, RN made aware    Exercises General Exercises - Lower Extremity Long Arc Quad: AROM;Both;Seated;15 reps Hip Flexion/Marching: AROM;Both;Seated;15 reps Toe Raises: AROM;Both;Seated;15 reps   Assessment/Plan    PT Assessment Patient needs continued PT services  PT Problem List Decreased range of motion;Decreased strength;Decreased mobility;Decreased activity tolerance;Decreased knowledge of use of DME       PT Treatment Interventions Gait training;Functional mobility training;Therapeutic activities;Patient/family education;Therapeutic exercise;DME instruction    PT Goals (Current goals can be found in the Care Plan section)  Acute Rehab PT Goals Patient Stated Goal: return to yard work PT Goal Formulation: With patient Time For Goal Achievement: 11/22/19 Potential to Achieve Goals: Good    Frequency Min 3X/week   Barriers to discharge        Co-evaluation               AM-PAC PT "6 Clicks" Mobility  Outcome Measure Help needed turning from your back to your side while in a flat bed without using bedrails?: None Help needed moving from lying on your back to sitting on the side of a flat bed without using bedrails?: None Help needed moving to and from a bed to a chair (including a wheelchair)?: None Help needed standing up from a chair using your arms (e.g., wheelchair or bedside chair)?: None Help needed to walk in hospital room?: A Little Help needed climbing 3-5 steps with a railing? : None 6 Click Score: 23    End of Session Equipment Utilized During Treatment: Gait belt Activity Tolerance: Patient tolerated treatment well Patient left: in chair;with call bell/phone within reach;with chair alarm set Nurse Communication: Mobility status PT Visit Diagnosis: Other abnormalities of gait and mobility (R26.89)     Time: 9675-9163 PT Time Calculation (min) (ACUTE ONLY): 24 min   Charges:   PT Evaluation $PT Eval Moderate Complexity: 1 Mod          Parks Czajkowski P, PT Acute Rehabilitation Services Pager: [phone] Office: [phone]   Enedina Finner Ayush Boulet 11/15/2019, 1:53 PM

## 2019-11-15 NOTE — Progress Notes (Signed)
PHARMACIST LIPID MONITORING   AVIGDOR DOLLAR is a 59 y.o. male admitted on 11/13/2019 with PVD.  Pharmacy has been consulted to optimize lipid-lowering therapy with the indication of secondary prevention for clinical ASCVD.  Recent Labs:  Lipid Panel (last 6 months):   Lab Results  Component Value Date   CHOL 97 11/14/2019   TRIG 76 11/14/2019   HDL 35 (L) 11/14/2019   CHOLHDL 2.8 11/14/2019   VLDL 15 11/14/2019   LDLCALC 47 11/14/2019    Hepatic function panel (last 6 months):   Lab Results  Component Value Date   AST 21 11/11/2019   ALT 18 11/11/2019   ALKPHOS 96 11/11/2019   BILITOT 0.5 11/11/2019    SCr (since admission):   Serum creatinine: 0.72 mg/dL 50/56/97 9480 Estimated creatinine clearance: 91.8 mL/min  Current lipid-lowering therapy: atorvastatin 20mg /d Previous lipid-lowering therapies (if applicable): none Documented or reported allergies or intolerances to lipid-lowering therapies (if applicable): none  Assessment:  Patient agrees with changes to lipid-lowering therapy  Recommendation per protocol:  Increase intensity or dose of current statin.  Follow-up with:  Primary care provider - , FNP  Follow-up labs after discharge:    Liver function panel and lipid panel in 8-12 weeks then annually  Plan: Change atorvastatin to 80mg  po daily  10-12, PharmD Clinical Pharmacist **Pharmacist phone directory can now be found on [email or web address] ([email or web address]).  Listed under [facility].

## 2019-11-15 NOTE — Progress Notes (Signed)
ABI w/ TBI study completed.   Please see CV Proc for preliminary results.   Erasmo Vertz, RDMS  

## 2019-11-16 NOTE — TOC Initial Note (Signed)
Transition of Care [facility]) - Initial/Assessment Note    Patient Details  Name: Benjamin [name] MRN: [id number] Date of Birth: 27-Mar-1960  Transition of Care [facility]) CM/SW Contact:    Norina Buzzard, RN Phone Number: 11/16/2019, 10:29 AM  Clinical Narrative: 59 yo with LE claudication s/p left fem pop BPG and right to left fem-fem BPG on 9/22. Reexploration of left groin on 9/23. PT is recommending a RW. Met with pt. He plans to return home with the support of his brother and sister who live with him. He reports that he is able to drive. He has insurance coverage for his medications. Discussed PT recommendations. Contacted [employer] for DME referral. Referral accepted by [place].   Expected Discharge Plan: Home/Self Care Barriers to Discharge: No Barriers Identified   Patient Goals and CMS Choice Patient states their goals for this hospitalization and ongoing recovery are:: to get better      Expected Discharge Plan and Services Expected Discharge Plan: Home/Self Care   Discharge Planning Services: CM Consult Post Acute Care Choice: Durable Medical Equipment Living arrangements for the past 2 months: Single Family Home                 DME Arranged: Walker rolling DME Agency: [employer] Date DME Agency Contacted: 11/16/19 Time DME Agency Contacted: [id number] Representative spoke with at DME Agency: Kristeen Miss            Prior Living Arrangements/Services Living arrangements for the past 2 months: [place] Lives with:: Siblings Patient language and need for interpreter reviewed:: Yes Do you feel safe going back to the place where you live?: Yes               Activities of Daily Living      Permission Sought/Granted Permission sought to share information with : Case Manager                Emotional Assessment Appearance:: Well-Groomed, Developmentally appropriate Attitude/Demeanor/Rapport: Engaged, Gracious Affect (typically observed): Accepting,  Appropriate, Calm, Pleasant Orientation: : Oriented to Self, Oriented to Place, Oriented to  Time, Oriented to Situation      Admission diagnosis:  Femoral-popliteal bypass graft occlusion, left (HCC) [F00.712R] PAD (peripheral artery disease) ([place]) [I73.9] Patient Active Problem List   Diagnosis Date Noted  . Femoral-popliteal bypass graft occlusion, left ([place]) 11/13/2019  . PAD (peripheral artery disease) ([place]) 11/13/2019  . Callus of foot 07/01/2019  . Intermittent claudication ([place]) 01/20/2017  . Need for influenza vaccination 01/20/2017  . Needs smoking cessation education 09/06/2016  . Abnormal echocardiogram 09/05/2016  . Abnormal EKG 07/25/2016  . Preoperative cardiovascular examination 07/25/2016  . Shortness of breath on exertion 07/25/2016  . Dyslipidemia, goal LDL below 70 07/25/2016  . Uncontrolled hypertension 07/25/2016  . Abnormal chest x-ray 07/04/2016  . Peripheral vascular disease ([place]) 07/04/2016  . Decreased pedal pulses 06/24/2016  . Varicose vein of leg 06/24/2016  . Heavy smoker 06/24/2016  . Abnormal weight loss 06/24/2016  . Foot pain, left 06/24/2016  . Screening for prostate cancer 06/24/2016   PCP:  [name], [place] Pharmacy:   [facility] [place], [place] [address] [place] [address] Phone: [phone] Fax: [phone]     Social Determinants of Health (SDOH) Interventions    Readmission Risk Interventions No flowsheet data found.

## 2019-11-16 NOTE — Progress Notes (Signed)
Occupational Therapy Progress Note  Patient supervision level this session with functional transfers and ambulation using rolling walker. Patient tolerate approx 265ft ambulation at supervision level. Educate patient in ankle pumps and elevating LEs due to L LE swelling "my L ankle is sore and swollen I noticed." Patient demo able to reach L foot this session to adjust darco shoe and set up with donning R shoe. OT will continue to follow.    11/16/19 1100  OT Visit Information  Last OT Received On 11/16/19  Assistance Needed +1  History of Present Illness 59 yo admitted for LE claudication s/p left fem pop BPG and Right to left fem-fem BPG on 9/22. 9/23 reexploration of left groin. PMhx: PVD, dyslipidemia  Precautions  Precautions Fall  Required Braces or Orthoses Other Brace  Other Brace Darco LLE  Pain Assessment  Pain Assessment Faces  Faces Pain Scale 2  Pain Location LLE  Pain Descriptors / Indicators Aching;Sore  Pain Intervention(s) Monitored during session  Cognition  Arousal/Alertness Awake/alert  Behavior During Therapy WFL for tasks assessed/performed  Overall Cognitive Status Within Functional Limits for tasks assessed  ADL  Overall ADL's  Needs assistance/impaired  Lower Body Dressing Set up;Sit to/from stand  Lower Body Dressing Details (indicate cue type and reason) patient demo ability to reach L darco shoe today, donned R shoe prior to ambulation  Toilet Transfer Supervision/safety;Ambulation;RW  Toilet Transfer Details (indicate cue type and reason) simulated with functional ambulation and transfer to recliner, supervision for safety  Functional mobility during ADLs Supervision/safety;Rolling walker  Bed Mobility  General bed mobility comments in recliner  Balance  Overall balance assessment Needs assistance  Sitting-balance support Feet supported  Sitting balance-Leahy Scale Good  Standing balance support No upper extremity supported  Standing balance-Leahy  Scale Fair  Standing balance comment static stand without UE support, increased safety with walker for ambulation  Restrictions  Weight Bearing Restrictions No  Transfers  Overall transfer level Needs assistance  Equipment used Rolling walker (2 wheeled)  Transfers Sit to/from Stand  Sit to Stand Supervision  General transfer comment cues for hand placement  General Comments  General comments (skin integrity, edema, etc.) patient on room air upon arrival, reports mild shortness of breath wearing mask during ambulation, O2 reading 98-100% on room air upon return to room  Exercises  Exercises Other exercises  Other Exercises  Other Exercises educate patient on ankle pumps and elevating LEs due to swelling in L LE/ankle  OT - End of Session  Equipment Utilized During Treatment Rolling walker  Activity Tolerance Patient tolerated treatment well  Patient left in chair;with call bell/phone within reach;with chair alarm set  Nurse Communication Mobility status  OT Assessment/Plan  OT Plan Discharge plan needs to be updated  OT Visit Diagnosis Unsteadiness on feet (R26.81);Pain  Pain - Right/Left Left  Pain - part of body Leg;Ankle and joints of foot  OT Frequency (ACUTE ONLY) Min 2X/week  Follow Up Recommendations No OT follow up  OT Equipment Other (comment) (rolling walker)  AM-PAC OT "6 Clicks" Daily Activity Outcome Measure (Version 2)  Help from another person eating meals? 4  Help from another person taking care of personal grooming? 3  Help from another person toileting, which includes using toliet, bedpan, or urinal? 3  Help from another person bathing (including washing, rinsing, drying)? 3  Help from another person to put on and taking off regular upper body clothing? 3  Help from another person to put on and taking off regular  lower body clothing? 3  6 Click Score 19  OT Goal Progression  Progress towards OT goals Progressing toward goals  Acute Rehab OT Goals  Patient  Stated Goal return to yard work  OT Goal Formulation With patient  Time For Goal Achievement 11/29/19  Potential to Achieve Goals Good  ADL Goals  Pt Will Perform Grooming with modified independence;standing  Pt Will Perform Lower Body Bathing with modified independence;sit to/from stand;sitting/lateral leans;with adaptive equipment  Pt Will Perform Lower Body Dressing with modified independence;sit to/from stand;sitting/lateral leans;with adaptive equipment  Pt Will Transfer to Toilet with modified independence;ambulating;regular height toilet  Pt Will Perform Toileting - Clothing Manipulation and hygiene with modified independence;sitting/lateral leans;sit to/from stand  OT Time Calculation  OT Start Time (ACUTE ONLY) 1006  OT Stop Time (ACUTE ONLY) 1029  OT Time Calculation (min) 23 min  OT General Charges  $OT Visit 1 Visit  OT Treatments  $Self Care/Home Management  23-37 mins   Marlyce Huge OT OT pager: [phone]

## 2019-11-16 NOTE — Progress Notes (Addendum)
   VASCULAR SURGERY ASSESSMENT & PLAN:   PAD: POD 3 right to left femorofemoral bypass and left femoral to below-knee popliteal artery bypass with vein secondary to nonhealing left foot ulcer.  Return to the OR approximately 24 hours later secondary to change in left foot motor function. (Duplex suggestive of mid graft stenosis >> Balloon angioplasty of vein graft performed.)  Decreased motor function of left foot; graft is patent with brisk Doppler signals.  Ulceration of the dorsum of the left foot is dry and stable.   The patient had a previously placed right common iliac artery stent..  Continue Plavix, aspirin and statin.  Continue physical therapy, occupational therapy and work on mobility.  We will follow up with physical therapist recommendations of any need for outpatient therapy.  SUBJECTIVE:   Minimal incisional pain. No rest pain. Tolerating diet. No CP or SOB  PHYSICAL EXAM:   Vitals:   11/15/19 2033 11/16/19 0020 11/16/19 0050 11/16/19 0435  BP: 139/90 (!) 129/94  (!) 147/92  Pulse: 100 87  89  Resp: 19 20 15 16   Temp: 98.9 F (37.2 C) 98.6 F (37 C)  98.6 F (37 C)  TempSrc: Oral Oral  Oral  SpO2: 97% 95% 91% 97%  Weight:      Height:       General appearance: The patient is awake this morning, alert and in no apparent distress Cardiac: Heart rate and rhythm are regular Lungs: Clear to auscultation bilaterally Incisions: Groin incisions and left lower extremity incisions are well approximated without bleeding, hematoma or drainage. Lower extremities: Both feet are warm and well-perfused.  Positive Doppler signals of left dorsalis pedis and peroneal.  These are monophasic.  He is able to weakly dorsiflex and plantar flex.  His sensation is intact.  Palpable pulse in femorofemoral bypass graft.  Left dorsal foot ulcer is dry without drainage.  LABS:   No new labs  PROBLEM LIST:    Active Problems:   Femoral-popliteal bypass graft occlusion, left (HCC)   PAD  (peripheral artery disease) (HCC)   CURRENT MEDS:   . sodium chloride   Intravenous Once  . amLODipine  5 mg Oral Daily  . atorvastatin  80 mg Oral Daily  . clopidogrel  75 mg Oral Daily  . collagenase  1 application Topical Daily  . docusate sodium  100 mg Oral Daily  . gabapentin  100 mg Oral TID  . heparin  5,000 Units Subcutaneous Q8H  . multivitamin with minerals  1 tablet Oral Daily  . pantoprazole  40 mg Oral Daily  . varenicline  1 mg Oral BID    , PA-C Office: [phone] 11/16/2019   I have interviewed the patient and examined the patient. I agree with the findings by the PA. Good Doppler signals left foot. We will see how he does with physical therapy today.  11/18/2019, MD [phone]

## 2019-11-17 ENCOUNTER — Encounter: Payer: Self-pay | Admitting: Nurse Practitioner

## 2019-11-17 MED ORDER — ATORVASTATIN CALCIUM 80 MG PO TABS
80.0000 mg | ORAL_TABLET | Freq: Every day | ORAL | 2 refills | Status: DC
Start: 2019-11-17 — End: 2019-12-12

## 2019-11-17 MED ORDER — HYDROCODONE-ACETAMINOPHEN 5-325 MG PO TABS
1.0000 | ORAL_TABLET | ORAL | 0 refills | Status: DC | PRN
Start: 2019-11-17 — End: 2019-12-12

## 2019-11-17 MED ORDER — CLOPIDOGREL BISULFATE 75 MG PO TABS: 75.0000 mg | ORAL_TABLET | Freq: Every day | ORAL | 11 refills | Status: DC

## 2019-11-17 NOTE — Progress Notes (Signed)
Conformed with TOC, Donn Pierini, RN that patient's Plavix would be covered by [employer], since patient expressed financial concerns about medication cost and coverage.  Case manager note from 11/16/19 also confirms coverage of Plavix.  Provided patient with Good Rx card and confirmed that his niece would be able to cover the cost of $18.00 if Plavix is not covered by [employer].  Provided patient education on the importance of not missing a dose of Plavix at discharge while reviewing AVS with patient.

## 2019-11-17 NOTE — Progress Notes (Addendum)
   VASCULAR SURGERY ASSESSMENT & PLAN:   PAD: POD 4 right to left femorofemoral bypass and left femoral to below-knee popliteal artery bypass with vein secondary to nonhealing left foot ulcer. Return to the OR approximately 24 hours later secondary to change in left foot motor function. (Duplex suggestive of mid graft stenosis >>Balloon angioplasty of vein graft performed.)  Decreased motor function of left foot; graft is patent with brisk Doppler signals.  Ulceration of the dorsum of the left foot is dry and stable. VSS.  PT/OT recs: home with RW  The patient had a previously placed right common iliac artery stent..  Continue Plavix, aspirin and statin.  Dispo: Discharge home today. Continue off-loading with Darco shoe, elevating LLE and local wound care.  SUBJECTIVE:   No complaints.  He is out of bed with nurse tech and has Darco shoe on left foot.  PHYSICAL EXAM:   Vitals:   11/16/19 2014 11/16/19 2359 11/17/19 0420 11/17/19 0423  BP: (!) 147/95 128/86 (!) 129/93   Pulse: (!) 107 99  98  Resp: 18 20 16 14   Temp: 98.9 F (37.2 C) 98.7 F (37.1 C) 98.9 F (37.2 C)   TempSrc: Oral Oral Oral   SpO2: 97% 100% 97%   Weight:      Height:       PE: General appearance: The patient is awake this morning, alert and in no apparent distress Cardiac: Heart rate and rhythm are regular Lungs: Clear to auscultation bilaterally Incisions: Groin incisions and left lower extremity incisions are well approximated without bleeding, hematoma or drainage. Lower extremities: Both feet are warm and well-perfused.  Positive Doppler signals of left dorsalis pedis and peroneal.  These are monophasic.  He is able to weakly dorsiflex and plantar flex.  His sensation is intact.  Palpable pulse in femorofemoral bypass graft.  Left dorsal foot ulcer is dry without drainage.     LABS:   No new labs   PROBLEM LIST:    Active Problems:   Femoral-popliteal bypass graft occlusion, left (HCC)   PAD  (peripheral artery disease) (HCC)   CURRENT MEDS:   . sodium chloride   Intravenous Once  . amLODipine  5 mg Oral Daily  . atorvastatin  80 mg Oral Daily  . clopidogrel  75 mg Oral Daily  . collagenase  1 application Topical Daily  . docusate sodium  100 mg Oral Daily  . gabapentin  100 mg Oral TID  . heparin  5,000 Units Subcutaneous Q8H  . multivitamin with minerals  1 tablet Oral Daily  . pantoprazole  40 mg Oral Daily  . varenicline  1 mg Oral BID    , PA-C Office: [phone] 11/17/2019   I have interviewed the patient and examined the patient. I agree with the findings by the PA. Agree with plans for D/C today.   11/19/2019, MD [phone]

## 2019-11-17 NOTE — Plan of Care (Signed)

## 2019-11-17 NOTE — Discharge Summary (Signed)
Bypass Discharge Summary Patient ID: Benjamin [name] [id number] 59 y.o. [birth year]  Admit date: 11/13/2019  Discharge date and time: No discharge date for patient encounter.   Admitting Physician: Nada Libman, MD   Discharge Physician: Cari Caraway, MD  Admission Diagnoses: Femoral-popliteal bypass graft occlusion, left (HCC) [X48.016P] PAD (peripheral artery disease) (HCC) [I73.9]  Discharge Diagnoses: Femoral-popliteal bypass graft occlusion, left (HCC) [V37.482L] PAD (peripheral artery disease) (HCC) [I73.9]  Admission Condition: good  Discharged Condition: good  Indication for Admission: Nonhealing left foot ulcer  Hospital Course: The patient is a 59 year old male with previous history of peripheral artery disease, limiting claudication and rest pain.  Prior to admission, he had undergone bilateral common iliac artery stenting.  Plavix was initiated and this was held prior to surgery.  He underwent preoperative cardiac evaluation.  On the day of admission he was taken to the operating room.  He underwent right to left femoral to femoral bypass graft with 8 mm Dacron graft.  Left femoral to below-knee popliteal artery bypass graft with nonreversed saphenous vein also performed.  He tolerated procedure well.  He received 3 units of packed red blood cells intraoperatively. On postoperative day 1, his vital signs were stable and he was afebrile.  He was complaining of decreased mobility of his left foot and duplex ultrasound was obtained of the left lower extremity.  There appeared to be a stenosis in the vein graft and he was counseled regarding need for return to the operating room for exploration of this area.  His left femoral incision was reopened and a left leg angiogram performed.  Balloon angioplasty was performed of the left femoral to popliteal bypass. Postoperative day 2, he had good Doppler signals in his left foot and his incisions were healing without signs of infection.   He maintained a palpable pulse in his femoral-femoral bypass graft.  He had weak dorsiflexion and plantarflexion mobility of the left foot.  Physical therapy and Occupational Therapy evaluations were carried out.  The patient was able to ambulate with rolling walker.  He had appropriate rise in hemoglobin after transfusion On postoperative day 3, he maintained his Doppler pulses, palpable pulse and femorofemoral graft and mobility increase.  His vital signs are stable and all incisions were healing without signs of infection or drainage.  He was ready for discharge home with instructions to wear his Darco shoe and to elevate his left lower extremity as often as possible.  He will continue Santyl ointment to left foot ulcer.  I encouraged him to call our office for any changes in this ulcerative area including drainage or erythema.  Consults: None  Treatments: surgery: Left to right femoral-femoral bypass graft, left femoral to below-knee popliteal artery bypass with vein, reopening left groin incision and balloon angioplasty of left femoropopliteal vein graft.   Disposition: Discharge disposition: 01-Home or Self Care       - For [facility] Registry use ---  Post-op:  Wound infection: No  Graft infection: No  Transfusion: Yes  If yes, 3 units given New Arrhythmia: No Patency judged by: [x ] Dopper only, [ x] Palpable graft pulse, [ ]  Palpable distal pulse, [x ] ABI inc. > 0.15, [ ]  Duplex Discharge ABI: R 0.69, L 0.70 Discharge TBI: R 0.32, L 0.0 D/C Ambulatory Status: Ambulatory with Assistance  Complications: MI: [ ]  No, [ ]  Troponin only, [ ]  EKG or Clinical CHF: No Resp failure: [ ]  none, [ ]  Pneumonia, [ ]  Ventilator Chg in renal  function: [ ]  none, [ ]  Inc. Cr > 0.5, [ ]  Temp. Dialysis, [ ]  Permanent dialysis Stroke: [ ]  None, [ ]  Minor, [ ]  Major Return to OR: Yes  Reason for return to OR: [ ]  Bleeding, [ ]  Infection, [ ]  Thrombosis, [ ]  Revision + stenosis  Discharge  medications: Statin use:  Yes ASA use:  No  for medical reason allergic Plavix use:  Yes Beta blocker use: No  for medical reason not indicated Coumadin use: No  for medical reason not indicated    Patient Instructions:  Allergies as of 11/17/2019      Reactions   Aspirin Nausea And Vomiting   Garlic Other (See Comments)   sneezing      Medication List    TAKE these medications   acetaminophen 500 MG tablet Commonly known as: TYLENOL Take 500 mg by mouth every 6 (six) hours as needed for mild pain or headache.   amLODipine 5 MG tablet Commonly known as: NORVASC Take 1 tablet (5 mg total) by mouth daily.   APPLE CIDER VINEGAR PO Take 1 tablet by mouth 2 (two) times a week.   atorvastatin 80 MG tablet Commonly known as: LIPITOR Take 1 tablet (80 mg total) by mouth daily. What changed:   medication strength  how much to take   cilostazol 100 MG tablet Commonly known as: PLETAL Take 1 tablet (100 mg total) by mouth 2 (two) times daily before a meal.   clopidogrel 75 MG tablet Commonly known as: Plavix Take 1 tablet (75 mg total) by mouth daily.   gabapentin 100 MG capsule Commonly known as: NEURONTIN TAKE 1 CAPSULE(100 MG) BY MOUTH THREE TIMES DAILY What changed: See the new instructions.   Glucosamine 500 MG Caps Take 500 mg by mouth daily.   HYDROcodone-acetaminophen 5-325 MG tablet Commonly known as: NORCO/VICODIN Take 1 tablet by mouth every 4 (four) hours as needed for moderate pain. What changed:   how much to take  when to take this  reasons to take this   Magnesium 200 MG Tabs Take 1 tablet by mouth daily with evening meal What changed:   how much to take  how to take this  when to take this   multivitamin with minerals Tabs tablet Take 1 tablet by mouth daily.   OVER THE COUNTER MEDICATION Take 2 capsules by mouth daily as needed (pain). Stacker 2 Fat Burner   Santyl ointment Generic drug: collagenase Apply 1 application  topically daily. Apply ointment the thickness of a nickel to the wound daily with a saline moistened gauze wrap and then dry gauze and gauze wrap   varenicline 1 MG tablet Commonly known as: Chantix Continuing Month Pak Take 1 tablet (1 mg total) by mouth 2 (two) times daily.   Vitamin D 50 MCG (2000 UT) tablet Take 2,000 Units by mouth daily.            Durable Medical Equipment  (From admission, onward)         Start     Ordered   11/16/19 1004  For home use only DME Walker rolling  Once       Question Answer Comment  Walker: With 5 Inch Wheels   Patient needs a walker to treat with the following condition S/P aorto-bifemoral bypass surgery      11/16/19 1012         Activity: no lifting, driving, or strenuous exercise for until follow-up Diet: cardiac diet Wound Care: Continue  Santyl applied to wound daily.  Follow-up with Dr. Myra Gianotti in 3 weeks.  Signed: Lynnell Jude Lucia Harm 11/17/2019 8:16 AM

## 2019-11-17 NOTE — Progress Notes (Signed)
Patient discharged. Transported patient by wheelchair to [employer]. PIV's removed, telemetry discontinued, AVS given with education provided.

## 2019-11-19 ENCOUNTER — Other Ambulatory Visit: Payer: Self-pay | Admitting: Nurse Practitioner

## 2019-11-19 ENCOUNTER — Other Ambulatory Visit: Payer: Self-pay

## 2019-11-19 ENCOUNTER — Other Ambulatory Visit: Payer: Self-pay | Admitting: Physician Assistant

## 2019-11-19 DIAGNOSIS — I70229 Atherosclerosis of native arteries of extremities with rest pain, unspecified extremity: Secondary | ICD-10-CM

## 2019-11-19 DIAGNOSIS — I10 Essential (primary) hypertension: Secondary | ICD-10-CM

## 2019-11-19 MED ORDER — CILOSTAZOL 100 MG PO TABS: 100.0000 mg | ORAL_TABLET | Freq: Two times a day (BID) | ORAL | 6 refills | Status: DC

## 2019-11-25 ENCOUNTER — Ambulatory Visit (INDEPENDENT_AMBULATORY_CARE_PROVIDER_SITE_OTHER): Payer: Managed Care, Other (non HMO) | Admitting: Nurse Practitioner

## 2019-11-25 ENCOUNTER — Encounter: Payer: Self-pay | Admitting: Nurse Practitioner

## 2019-11-25 ENCOUNTER — Other Ambulatory Visit: Payer: Self-pay

## 2019-11-25 VITALS — BP 120/76 | HR 74 | Temp 98.4°F | Ht 66.6 in | Wt 143.6 lb

## 2019-11-25 DIAGNOSIS — Z720 Tobacco use: Secondary | ICD-10-CM | POA: Diagnosis not present

## 2019-11-25 DIAGNOSIS — I739 Peripheral vascular disease, unspecified: Secondary | ICD-10-CM | POA: Diagnosis not present

## 2019-11-25 DIAGNOSIS — I10 Essential (primary) hypertension: Secondary | ICD-10-CM

## 2019-11-25 MED ORDER — GABAPENTIN 100 MG PO CAPS: ORAL_CAPSULE | ORAL | 2 refills | Status: DC

## 2019-11-25 MED ORDER — BUPROPION HCL ER (XL) 150 MG PO TB24: 150.0000 mg | ORAL_TABLET | ORAL | 2 refills | Status: DC

## 2019-11-25 NOTE — Patient Instructions (Signed)
Bupropion sustained-release tablets (smoking cessation) What is this medicine? BUPROPION (byoo PROE pee on) is used to help people quit smoking. This medicine may be used for other purposes; ask your health care provider or pharmacist if you have questions. COMMON BRAND NAME(S): Buproban, Zyban What should I tell my health care provider before I take this medicine? They need to know if you have any of these conditions:  an eating disorder, such as anorexia or bulimia  bipolar disorder or psychosis  diabetes or high blood sugar, treated with medication  glaucoma  head injury or brain tumor  heart disease, previous heart attack, or irregular heart beat  high blood pressure  kidney or liver disease  seizures  suicidal thoughts or a previous suicide attempt  Tourette's syndrome  weight loss  an unusual or allergic reaction to bupropion, other medicines, foods, dyes, or preservatives  breast-feeding  pregnant or trying to become pregnant How should I use this medicine? Take this medicine by mouth with a glass of water. Follow the directions on the prescription label. You can take it with or without food. If it upsets your stomach, take it with food. Do not cut, crush or chew this medicine. Take your medicine at regular intervals. If you take this medicine more than once a day, take your second dose at least 8 hours after you take your first dose. To limit difficulty in sleeping, avoid taking this medicine at bedtime. Do not take your medicine more often than directed. Do not stop taking this medicine suddenly except upon the advice of your doctor. Stopping this medicine too quickly may cause serious side effects. A special MedGuide will be given to you by the pharmacist with each prescription and refill. Be sure to read this information carefully each time. Talk to your pediatrician regarding the use of this medicine in children. Special care may be needed. Overdosage: If you  think you have taken too much of this medicine contact a poison control center or emergency room at once. NOTE: This medicine is only for you. Do not share this medicine with others. What if I miss a dose? If you miss a dose, skip the missed dose and take your next tablet at the regular time. There should be at least 8 hours between doses. Do not take double or extra doses. What may interact with this medicine? Do not take this medicine with any of the following medications:  linezolid  MAOIs like Azilect, Carbex, Eldepryl, Marplan, Nardil, and Parnate  methylene blue (injected into a vein)  other medicines that contain bupropion like Wellbutrin This medicine may also interact with the following medications:  alcohol  certain medicines for anxiety or sleep  certain medicines for blood pressure like metoprolol, propranolol  certain medicines for depression or psychotic disturbances  certain medicines for HIV or AIDS like efavirenz, lopinavir, nelfinavir, ritonavir  certain medicines for irregular heart beat like propafenone, flecainide  certain medicines for Parkinson's disease like amantadine, levodopa  certain medicines for seizures like carbamazepine, phenytoin, phenobarbital  cimetidine  clopidogrel  cyclophosphamide  digoxin  furazolidone  isoniazid  nicotine  orphenadrine  procarbazine  steroid medicines like prednisone or cortisone  stimulant medicines for attention disorders, weight loss, or to stay awake  tamoxifen  theophylline  thiotepa  ticlopidine  tramadol  warfarin This list may not describe all possible interactions. Give your health care provider a list of all the medicines, herbs, non-prescription drugs, or dietary supplements you use. Also tell them if you smoke,   drink alcohol, or use illegal drugs. Some items may interact with your medicine. What should I watch for while using this medicine? Visit your doctor or healthcare provider  for regular checks on your progress. This medicine should be used together with a patient support program. It is important to participate in a behavioral program, counseling, or other support program that is recommended by your healthcare provider. This medicine may cause serious skin reactions. They can happen weeks to months after starting the medicine. Contact your healthcare provider right away if you notice fevers or flu-like symptoms with a rash. The rash may be red or purple and then turn into blisters or peeling of the skin. Or, you might notice a red rash with swelling of the face, lips or lymph nodes in your neck or under your arms. Patients and their families should watch out for new or worsening thoughts of suicide or depression. Also watch out for sudden changes in feelings such as feeling anxious, agitated, panicky, irritable, hostile, aggressive, impulsive, severely restless, overly excited and hyperactive, or not being able to sleep. If this happens, especially at the beginning of treatment or after a change in dose, call your healthcare provider. Avoid alcoholic drinks while taking this medicine. Drinking excessive alcoholic beverages, using sleeping or anxiety medicines, or quickly stopping the use of these agents while taking this medicine may increase your risk for a seizure. Do not drive or use heavy machinery until you know how this medicine affects you. This medicine can impair your ability to perform these tasks. Do not take this medicine close to bedtime. It may prevent you from sleeping. Your mouth may get dry. Chewing sugarless gum or sucking hard candy, and drinking plenty of water may help. Contact your doctor if the problem does not go away or is severe. Do not use nicotine patches or chewing gum without the advice of your doctor or healthcare provider while taking this medicine. You may need to have your blood pressure taken regularly if your doctor recommends that you use both  nicotine and this medicine together. What side effects may I notice from receiving this medicine? Side effects that you should report to your doctor or health care professional as soon as possible:  allergic reactions like skin rash, itching or hives, swelling of the face, lips, or tongue  breathing problems  changes in vision  confusion  elevated mood, decreased need for sleep, racing thoughts, impulsive behavior  fast or irregular heartbeat  hallucinations, loss of contact with reality  increased blood pressure  rash, fever, and swollen lymph nodes  redness, blistering, peeling, or loosening of the skin, including inside the mouth  seizures  suicidal thoughts or other mood changes  unusually weak or tired  vomiting Side effects that usually do not require medical attention (report to your doctor or health care professional if they continue or are bothersome):  constipation  headache  loss of appetite  nausea  tremors  weight loss This list may not describe all possible side effects. Call your doctor for medical advice about side effects. You may report side effects to FDA at 1-800-FDA-1088. Where should I keep my medicine? Keep out of the reach of children. Store at room temperature between 20 and 25 degrees C (68 and 77 degrees F). Protect from light. Keep container tightly closed. Throw away any unused medicine after the expiration date. NOTE: This sheet is a summary. It may not cover all possible information. If you have questions about this medicine,   talk to your doctor, pharmacist, or health care provider.  2020 Elsevier/Gold Standard (2018-05-03 13:59:09)  

## 2019-11-25 NOTE — Progress Notes (Signed)
This visit occurred during the SARS-CoV-2 public health emergency.  Safety protocols were in place, including screening questions prior to the visit, additional usage of staff PPE, and extensive cleaning of exam room while observing appropriate contact time as indicated for disinfecting solutions.  Subjective:     Patient ID: Benjamin [name] , male    DOB: 03/31/1960 , 59 y.o.   MRN: [id number]   Chief Complaint  Patient presents with  . Hypertension    HPI  Patient is here for a blood pressure check after starting amlodipine He has also had a stent placed to his left leg, he is scheduled to follow up with Dr. Myra Gianotti on October 11th, he continues to have intermittent pain. Only taking gabapentin once a day.  Hypertension This is a chronic problem. The current episode started more than 1 year ago. The problem is uncontrolled. Pertinent negatives include no chest pain, headaches or palpitations. Risk factors for coronary artery disease include sedentary lifestyle and smoking/tobacco exposure. Past treatments include nothing. Compliance problems: not taking his medication.  There is no history of angina. There is no history of chronic renal disease.     Past Medical History:  Diagnosis Date  . Back pain   . Heavy smoker   . Hyperlipidemia LDL goal <70   . Hypertension   . 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   Bilateral femoral artery disease     Family History  Problem Relation Age of Onset  . Arthritis Mother   . COPD Father   . Stroke Father   . Multiple sclerosis Sister      Current Outpatient Medications:  .  acetaminophen (TYLENOL) 500 MG tablet, Take 500 mg by mouth every 6 (six) hours as needed for mild pain or headache., Disp: , Rfl:  .  amLODipine (NORVASC) 5 MG tablet, TAKE 1 TABLET(5 MG) BY MOUTH DAILY, Disp: 30 tablet, Rfl: 0 .  APPLE CIDER VINEGAR PO, Take 1 tablet by mouth 2 (two) times a week., Disp: , Rfl:  .  atorvastatin (LIPITOR) 80 MG tablet,  Take 1 tablet (80 mg total) by mouth daily., Disp: 30 tablet, Rfl: 2 .  Cholecalciferol (VITAMIN D) 50 MCG (2000 UT) tablet, Take 2,000 Units by mouth daily., Disp: , Rfl:  .  cilostazol (PLETAL) 100 MG tablet, Take 1 tablet (100 mg total) by mouth 2 (two) times daily before a meal., Disp: 60 tablet, Rfl: 6 .  clopidogrel (PLAVIX) 75 MG tablet, Take 1 tablet (75 mg total) by mouth daily., Disp: 30 tablet, Rfl: 11 .  collagenase (SANTYL) ointment, Apply 1 application topically daily. Apply ointment the thickness of a nickel to the wound daily with a saline moistened gauze wrap and then dry gauze and gauze wrap, Disp: 15 g, Rfl: 0 .  gabapentin (NEURONTIN) 100 MG capsule, Take 1 capsule morning, noon and take 2 capsules at bedtime, Disp: 90 capsule, Rfl: 2 .  Glucosamine 500 MG CAPS, Take 500 mg by mouth daily., Disp: , Rfl:  .  HYDROcodone-acetaminophen (NORCO/VICODIN) 5-325 MG tablet, Take 1 tablet by mouth every 4 (four) hours as needed for moderate pain., Disp: 20 tablet, Rfl: 0 .  Magnesium 200 MG TABS, Take 1 tablet by mouth daily with evening meal (Patient taking differently: Take 200 mg by mouth every evening. Take 1 tablet by mouth daily with evening meal), Disp: 30 tablet, Rfl: 2 .  Multiple Vitamin (MULTIVITAMIN WITH MINERALS) TABS tablet, Take 1 tablet by mouth daily., Disp: , Rfl:  .  buPROPion (WELLBUTRIN XL) 150 MG 24 hr tablet, Take 1 tablet (150 mg total) by mouth every morning., Disp: 30 tablet, Rfl: 2   Allergies  Allergen Reactions  . Aspirin Nausea And Vomiting  . Garlic Other (See Comments)    sneezing     Review of Systems  Constitutional: Negative.  Negative for fatigue.  Respiratory: Negative.  Negative for cough.   Cardiovascular: Negative for chest pain, palpitations and leg swelling.  Neurological: Negative for dizziness and headaches.  Psychiatric/Behavioral: Negative.     Today's Vitals   11/25/19 1009  BP: 120/76  Pulse: 74  Temp: 98.4 F (36.9 C)   TempSrc: Oral  Weight: 143 lb 9.6 oz (65.1 kg)  Height: 5' 6.6" (1.692 m)  PainSc: 0-No pain   Body mass index is 22.76 kg/m.   Objective:  Physical Exam Vitals reviewed.  Constitutional:      General: He is not in acute distress.    Appearance: Normal appearance.  Cardiovascular:     Pulses: Normal pulses.     Heart sounds: Normal heart sounds. No murmur heard.   Pulmonary:     Effort: Pulmonary effort is normal. No respiratory distress.     Breath sounds: Normal breath sounds.  Musculoskeletal:     Comments: He has a walking shoe on from his surgery for his stent placement  Skin:    Capillary Refill: Capillary refill takes less than 2 seconds.  Neurological:     General: No focal deficit present.     Mental Status: He is alert and oriented to person, place, and time.     Cranial Nerves: No cranial nerve deficit.  Psychiatric:        Mood and Affect: Mood normal.        Behavior: Behavior normal.        Thought Content: Thought content normal.        Judgment: Judgment normal.         Assessment And Plan:     1. Uncontrolled hypertension  Blood pressure is much better controlled  Continue with amlodipine  - buPROPion (WELLBUTRIN XL) 150 MG 24 hr tablet; Take 1 tablet (150 mg total) by mouth every morning.  Dispense: 30 tablet; Refill: 2  2. Peripheral vascular disease (HCC)   Continues to have pain up his left leg  Continue with follow up with Dr. Myra Gianotti - gabapentin (NEURONTIN) 100 MG capsule; Take 1 capsule morning, noon and take 2 capsules at bedtime  Dispense: 90 capsule; Refill: 2 - buPROPion (WELLBUTRIN XL) 150 MG 24 hr tablet; Take 1 tablet (150 mg total) by mouth every morning.  Dispense: 30 tablet; Refill: 2  3. Tobacco abuse  chantix has been recalled will try him on bupropion and return in 6 weeks.  Advised if has any thoughts of harm or different thoughts to call to office for direction - buPROPion (WELLBUTRIN XL) 150 MG 24 hr tablet; Take 1  tablet (150 mg total) by mouth every morning.  Dispense: 30 tablet; Refill: 2   He reports he has had his covid vaccine and will call to provide the date   Patient was given opportunity to ask questions. Patient verbalized understanding of the plan and was able to repeat key elements of the plan. All questions were answered to their satisfaction.   Jeanell Sparrow, FNP, have reviewed all documentation for this visit. The documentation on 11/25/19 for the exam, diagnosis, procedures, and orders are all accurate and complete.  THE PATIENT IS  ENCOURAGED TO PRACTICE SOCIAL DISTANCING DUE TO THE COVID-19 PANDEMIC.   

## 2019-11-26 ENCOUNTER — Other Ambulatory Visit: Payer: Self-pay | Admitting: Vascular Surgery

## 2019-11-26 MED ORDER — HYDROCODONE-ACETAMINOPHEN 5-325 MG PO TABS: 1.0000 | ORAL_TABLET | Freq: Four times a day (QID) | ORAL | 0 refills | Status: AC | PRN

## 2019-12-02 ENCOUNTER — Encounter: Payer: Self-pay | Admitting: Surgery

## 2019-12-02 ENCOUNTER — Other Ambulatory Visit: Payer: Self-pay

## 2019-12-02 ENCOUNTER — Ambulatory Visit (INDEPENDENT_AMBULATORY_CARE_PROVIDER_SITE_OTHER): Payer: Self-pay | Admitting: Surgery

## 2019-12-02 VITALS — BP 118/82 | HR 101 | Temp 98.1°F | Resp 20 | Ht 66.0 in | Wt 143.0 lb

## 2019-12-02 DIAGNOSIS — I7025 Atherosclerosis of native arteries of other extremities with ulceration: Secondary | ICD-10-CM

## 2019-12-02 NOTE — Progress Notes (Signed)
Patient name: Benjamin [name] MRN: [id number] DOB: Oct 06, 1960 Sex: male  REASON FOR VISIT:     post op  HISTORY OF PRESENT ILLNESS:   Benjamin [name] is a 59 y.o. male with severe PAD and left toe ulcer.  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  He underwent stenting of a right common and external iliac stenosis.  Then on 11/13/2019 he had a right to left femoral-femoral bypass graft followed by left femoral to below-knee popliteal artery bypass graft with saphenous vein.  He was taken back to the operating room on 923 due to a bypass graft stenosis which was treated with balloon angioplasty.  CURRENT MEDICATIONS:    Current Outpatient Medications  Medication Sig Dispense Refill  . acetaminophen (TYLENOL) 500 MG tablet Take 500 mg by mouth every 6 (six) hours as needed for mild pain or headache.    Marland Kitchen amLODipine (NORVASC) 5 MG tablet TAKE 1 TABLET(5 MG) BY MOUTH DAILY 30 tablet 0  . APPLE CIDER VINEGAR PO Take 1 tablet by mouth 2 (two) times a week.    Marland Kitchen atorvastatin (LIPITOR) 80 MG tablet Take 1 tablet (80 mg total) by mouth daily. 30 tablet 2  . buPROPion (WELLBUTRIN XL) 150 MG 24 hr tablet Take 1 tablet (150 mg total) by mouth every morning. 30 tablet 2  . Cholecalciferol (VITAMIN D) 50 MCG (2000 UT) tablet Take 2,000 Units by mouth daily.    . cilostazol (PLETAL) 100 MG tablet Take 1 tablet (100 mg total) by mouth 2 (two) times daily before a meal. 60 tablet 6  . clopidogrel (PLAVIX) 75 MG tablet Take 1 tablet (75 mg total) by mouth daily. 30 tablet 11  . collagenase (SANTYL) ointment Apply 1 application topically daily. Apply ointment the thickness of a nickel to the wound daily with a saline moistened gauze wrap and then dry gauze and gauze wrap 15 g 0  . gabapentin (NEURONTIN) 100 MG capsule Take 1 capsule morning, noon and take 2 capsules at bedtime 90 capsule 2  . Glucosamine 500 MG CAPS Take 500 mg by mouth  daily.    Marland Kitchen HYDROcodone-acetaminophen (NORCO) 5-325 MG tablet Take 1-2 tablets by mouth every 6 (six) hours as needed for up to 7 days for moderate pain. 30 tablet 0  . HYDROcodone-acetaminophen (NORCO/VICODIN) 5-325 MG tablet Take 1 tablet by mouth every 4 (four) hours as needed for moderate pain. 20 tablet 0  . Magnesium 200 MG TABS Take 1 tablet by mouth daily with evening meal (Patient taking differently: Take 200 mg by mouth every evening. Take 1 tablet by mouth daily with evening meal) 30 tablet 2  . Multiple Vitamin (MULTIVITAMIN WITH MINERALS) TABS tablet Take 1 tablet by mouth daily.     No current facility-administered medications for this visit.    REVIEW OF SYSTEMS:   [X]  denotes positive finding, [ ]  denotes negative finding Cardiac  Comments:  Chest pain or chest pressure:    Shortness of breath upon exertion:    Short of breath when lying flat:    Irregular heart rhythm:    Constitutional    Fever or chills:      PHYSICAL EXAM:   Vitals:   12/02/19 0817  BP: 118/82  Pulse: (!) 101  Resp: 20  Temp: 98.1 F (36.7 C)  SpO2: 93%  Weight: 143 lb (64.9 kg)  Height: 5\' 6"  (1.676 m)    GENERAL: The patient is a well-nourished male,  in no acute distress. The vital signs are documented above. CARDIOVASCULAR: There is a regular rate and rhythm. PULMONARY: Non-labored respirations Brisk DP and PT Doppler signals 1-2+ edema to the left leg All incisions are healing nicely  STUDIES:      MEDICAL ISSUES:   Patient doing very well status post revascularization.  He still has a left great toe wound.  I am sending him to the wound center for further treatment.  He will follow-up with me in 3 months with vascular lab studies.  Charlena Cross, MD, FACS [facility] [phone] Pager [phone]

## 2019-12-03 ENCOUNTER — Other Ambulatory Visit: Payer: Self-pay | Admitting: *Deleted

## 2019-12-03 DIAGNOSIS — I7025 Atherosclerosis of native arteries of other extremities with ulceration: Secondary | ICD-10-CM

## 2019-12-03 DIAGNOSIS — I739 Peripheral vascular disease, unspecified: Secondary | ICD-10-CM

## 2019-12-05 ENCOUNTER — Telehealth: Payer: Self-pay

## 2019-12-05 NOTE — Telephone Encounter (Signed)
I have returned pt's call regarding his wound vac referall-he did not answer.

## 2019-12-09 ENCOUNTER — Telehealth: Payer: Self-pay

## 2019-12-09 NOTE — Telephone Encounter (Signed)
Patient left VM to ask for an unspecified RX. Tried to return call, no answer - VM full.

## 2019-12-10 ENCOUNTER — Telehealth: Payer: Self-pay

## 2019-12-10 NOTE — Telephone Encounter (Signed)
Refills on pain med denied per PA until patient has f/u appt in 2 days. Attempted to call. VM box full.

## 2019-12-11 NOTE — Telephone Encounter (Signed)
Opened in error

## 2019-12-12 ENCOUNTER — Ambulatory Visit (INDEPENDENT_AMBULATORY_CARE_PROVIDER_SITE_OTHER): Payer: Self-pay | Admitting: Physician Assistant

## 2019-12-12 ENCOUNTER — Other Ambulatory Visit: Payer: Self-pay

## 2019-12-12 VITALS — BP 129/89 | HR 100 | Temp 98.4°F | Resp 20 | Ht 66.0 in | Wt 146.0 lb

## 2019-12-12 DIAGNOSIS — I739 Peripheral vascular disease, unspecified: Secondary | ICD-10-CM

## 2019-12-12 MED ORDER — TRAMADOL HCL 50 MG PO TABS: 50.0000 mg | ORAL_TABLET | Freq: Four times a day (QID) | ORAL | 0 refills | Status: DC | PRN

## 2019-12-12 NOTE — Progress Notes (Signed)
POST OPERATIVE OFFICE NOTE    CC:  F/u for surgery  HPI:  Benjamin [name] is a 59 y.o. male with severe PAD and left toe ulcer.  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  He underwent stenting of a right common and external iliac stenosis.  Then on 11/13/2019 he had a right to left femoral-femoral bypass graft followed by left femoral to below-knee popliteal artery bypass graft with saphenous vein.  He was taken back to the operating room on 923 due to a bypass graft stenosis which was treated with balloon angioplasty.  Pt returns today for follow up secondary to pain issues.  He has an appointment with wound care in Nov. For a GT ulcer.  He wants to ask for more hydrocodone.  He states he has pain, but not sure when and where the pain comes from.. At time it's worse in the am or the pm.  He states he uses elevation for the edema, but that causes pain at times as well.    Allergies  Allergen Reactions  . Aspirin Nausea And Vomiting  . Garlic Other (See Comments)    sneezing    Current Outpatient Medications  Medication Sig Dispense Refill  . acetaminophen (TYLENOL) 500 MG tablet Take 500 mg by mouth every 6 (six) hours as needed for mild pain or headache.    Marland Kitchen amLODipine (NORVASC) 5 MG tablet TAKE 1 TABLET(5 MG) BY MOUTH DAILY 30 tablet 0  . APPLE CIDER VINEGAR PO Take 1 tablet by mouth 2 (two) times a week.    Marland Kitchen atorvastatin (LIPITOR) 20 MG tablet Take 20 mg by mouth daily.    Marland Kitchen buPROPion (WELLBUTRIN XL) 150 MG 24 hr tablet Take 1 tablet (150 mg total) by mouth every morning. 30 tablet 2  . Cholecalciferol (VITAMIN D) 50 MCG (2000 UT) tablet Take 2,000 Units by mouth daily.    . cilostazol (PLETAL) 100 MG tablet Take 1 tablet (100 mg total) by mouth 2 (two) times daily before a meal. 60 tablet 6  . clopidogrel (PLAVIX) 75 MG tablet Take 1 tablet (75 mg total) by mouth daily. 30 tablet 11  . collagenase (SANTYL) ointment Apply 1 application  topically daily. Apply ointment the thickness of a nickel to the wound daily with a saline moistened gauze wrap and then dry gauze and gauze wrap 15 g 0  . gabapentin (NEURONTIN) 100 MG capsule Take 1 capsule morning, noon and take 2 capsules at bedtime 90 capsule 2  . Glucosamine 500 MG CAPS Take 500 mg by mouth daily.    . Magnesium 200 MG TABS Take 1 tablet by mouth daily with evening meal (Patient taking differently: Take 200 mg by mouth every evening. Take 1 tablet by mouth daily with evening meal) 30 tablet 2  . Multiple Vitamin (MULTIVITAMIN WITH MINERALS) TABS tablet Take 1 tablet by mouth daily.     No current facility-administered medications for this visit.     ROS:  See HPI  Physical Exam:    Incision:  well healing  Extremities:  Doppler signal DP>Peroneal intact.  Left GT dorsal wound without erythema.  Clear fluid drainage.   Moderate edema in the left LE.     Assessment/Plan:  This is a 59 y.o. male who is s/p: Procedure:   #1: Reopening of left femoral incision and redo exposure of left femoral artery                         #  2: Left lower extremity angiogram                         #3: Balloon angioplasty of left femoral-popliteal bypass graft  His bypass is patent with doppler signals intact.  I encouraged him to stretch out and elevate his left LE when at rest.  Dry dressing to the GT when up and mobile.  He wears a post op shoe for protection.    He has a f/u with DR. Brabham  On 03/17/2019.  I did give him 10 tramadol and asked him to wean down to Tylenol.  If he continues to have pain or other issues he will call.  Mosetta Pigeon PA-C [facility] [phone]  Clinic MD:  Darrick Penna

## 2019-12-17 ENCOUNTER — Telehealth: Payer: Self-pay

## 2019-12-17 NOTE — Telephone Encounter (Signed)
Patient left VM about a note to go back to work. Tried to call back, no answer and VM full.

## 2019-12-19 ENCOUNTER — Telehealth: Payer: Self-pay | Admitting: Licensed Clinical Social Worker

## 2019-12-19 NOTE — Telephone Encounter (Signed)
Attempted to reach Mr. Cowen at the request of Vascular team regarding paperwork to be completed. Called preferred number at [phone]; no answer and voicemail full at this time.   CSW will reattempt as able.   [name], MSW, LCSW [facility] Clinical Social Work

## 2019-12-19 NOTE — Telephone Encounter (Signed)
CSW received a call back from pt [phone]). Pt states he is employed full time at [employer]. He has been cleared to return to work, had inquired about if he was supposed to be compensated while in the hospital by his employer and had questions about short term disability. At this time pt does not have any specific paperwork to be completed.   CSW encouraged pt to pass these questions to the HR department of [facility] and then get back in touch with CSW team if there was any additional questions or paperwork we could help him complete.  [name], MSW, LCSW [facility] Clinical Social Work

## 2019-12-22 ENCOUNTER — Other Ambulatory Visit: Payer: Self-pay | Admitting: Nurse Practitioner

## 2019-12-22 DIAGNOSIS — I10 Essential (primary) hypertension: Secondary | ICD-10-CM

## 2019-12-23 ENCOUNTER — Encounter: Payer: Self-pay | Admitting: *Deleted

## 2019-12-24 ENCOUNTER — Ambulatory Visit: Payer: Managed Care, Other (non HMO) | Admitting: Podiatry

## 2020-01-03 ENCOUNTER — Encounter (HOSPITAL_BASED_OUTPATIENT_CLINIC_OR_DEPARTMENT_OTHER): Payer: Managed Care, Other (non HMO) | Attending: Internal Medicine | Admitting: Internal Medicine

## 2020-01-03 ENCOUNTER — Other Ambulatory Visit: Payer: Self-pay

## 2020-01-03 DIAGNOSIS — F17210 Nicotine dependence, cigarettes, uncomplicated: Secondary | ICD-10-CM | POA: Insufficient documentation

## 2020-01-03 DIAGNOSIS — Z886 Allergy status to analgesic agent status: Secondary | ICD-10-CM | POA: Diagnosis not present

## 2020-01-03 DIAGNOSIS — I70245 Atherosclerosis of native arteries of left leg with ulceration of other part of foot: Secondary | ICD-10-CM | POA: Diagnosis not present

## 2020-01-03 DIAGNOSIS — L97522 Non-pressure chronic ulcer of other part of left foot with fat layer exposed: Secondary | ICD-10-CM | POA: Diagnosis present

## 2020-01-03 NOTE — Progress Notes (Signed)
ZAHIR, EISENHOUR ([id number]) Visit Report for 01/03/2020 Abuse/Suicide Risk Screen Details Patient Name: Date of Service: [place], [place] V ID L. 01/03/2020 9:00 A M Medical Record Number: [id number] Patient Account Number: [id number] Date of Birth/Sex: Treating RN: [birth year] (59 y.o. Judie Petit) Yevonne Pax Primary Care Nicklous Aburto: Arnette Felts Other Clinician: Referring Avi Kerschner: Treating Annalyce Lanpher/Extender: Pauline Good in Treatment: 0 Abuse/Suicide Risk Screen Items Answer ABUSE RISK SCREEN: Has anyone close to you tried to hurt or harm you recentlyo No Do you feel uncomfortable with anyone in your familyo No Has anyone forced you do things that you didnt want to doo No Electronic Signature(s) Signed: 01/03/2020 5:42:44 PM By: Yevonne Pax RN Entered By: Yevonne Pax on 01/03/2020 09:41:29 -------------------------------------------------------------------------------- Activities of Daily Living Details Patient Name: Date of Service: CO [place], [place] V ID L. 01/03/2020 9:00 A M Medical Record Number: [id number] Patient Account Number: [id number] Date of Birth/Sex: Treating RN: 23-Jul-1960 (59 y.o. Judie Petit) Yevonne Pax Primary Care Ayse Mccartin: Arnette Felts Other Clinician: Referring Edy Mcbane: Treating Kierrah Kilbride/Extender: Pauline Good in Treatment: 0 Activities of Daily Living Items Answer Activities of Daily Living (Please select one for each item) Drive Automobile Completely Able T Medications ake Completely Able Use T elephone Completely Able Care for Appearance Completely Able Use T oilet Completely Able Bath / Shower Completely Able Dress Self Completely Able Feed Self Completely Able Walk Completely Able Get In / Out Bed Completely Able Housework Completely Able Prepare Meals Completely Able Handle Money Completely Able Shop for Self Completely Able Electronic Signature(s) Signed: 01/03/2020 5:42:44 PM By: Yevonne Pax RN Entered By: Yevonne Pax on 01/03/2020 09:41:52 -------------------------------------------------------------------------------- Education Screening Details Patient Name: Date of Service: [place], [place] V [place] L. 01/03/2020 9:00 A M Medical Record Number: [id number] Patient Account Number: [id number] Date of Birth/Sex: Treating RN: 05/19/1960 (59 y.o. Melonie [place] Primary Care Jahson Emanuele: Arnette Felts Other Clinician: Referring Megan Presti: Treating Sidi Dzikowski/Extender: Pauline Good in Treatment: 0 Primary Learner Assessed: Patient Learning Preferences/Education Level/Primary Language Learning Preference: Explanation Highest Education Level: High School Preferred Language: English Cognitive Barrier Language Barrier: No Translator Needed: No Memory Deficit: No Emotional Barrier: No Cultural/Religious Beliefs Affecting Medical Care: No Physical Barrier Impaired Vision: No Impaired Hearing: No Decreased Hand dexterity: No Knowledge/Comprehension Knowledge Level: Medium Comprehension Level: Medium Ability to understand written instructions: Medium Ability to understand verbal instructions: Medium Motivation Anxiety Level: Anxious Cooperation: Cooperative Education Importance: Acknowledges Need Interest in Health Problems: Asks Questions Perception: Coherent Willingness to Engage in Self-Management High Activities: Readiness to Engage in Self-Management High Activities: Electronic Signature(s) Signed: 01/03/2020 5:42:44 PM By: Yevonne Pax RN Entered By: Yevonne Pax on 01/03/2020 09:42:40 -------------------------------------------------------------------------------- Fall Risk Assessment Details Patient Name: Date of Service: [place], [place] V [place] L. 01/03/2020 9:00 A M Medical Record Number: [id number] Patient Account Number: [id number] Date of Birth/Sex: Treating RN: 1960/10/07 (59 y.o. Judie Petit) Yevonne Pax Primary Care Phiona Ramnauth: Arnette Felts Other Clinician: Referring  Keasha Malkiewicz: Treating Chrstopher Malenfant/Extender: Pauline Good in Treatment: 0 Fall Risk Assessment Items Have you had 2 or more falls in the last 12 monthso 0 No Have you had any fall that resulted in injury in the last 12 monthso 0 No FALLS RISK SCREEN History of falling - immediate or within 3 months 0 No Secondary diagnosis (Do you have 2 or more medical diagnoseso) 0 No Ambulatory aid None/bed rest/wheelchair/nurse 0 No Crutches/cane/walker 0 No Furniture 0 No Intravenous therapy Access/Saline/Heparin Lock 0 No Gait/Transferring  Normal/ bed rest/ wheelchair 0 No Weak (short steps with or without shuffle, stooped but able to lift head while walking, may seek 0 No support from furniture) Impaired (short steps with shuffle, may have difficulty arising from chair, head down, impaired 0 No balance) Mental Status Oriented to own ability 0 No Electronic Signature(s) Signed: 01/03/2020 5:42:44 PM By: Yevonne Pax RN Entered By: Yevonne Pax on 01/03/2020 09:42:47 -------------------------------------------------------------------------------- Foot Assessment Details Patient Name: Date of Service: [place], [place] V ID L. 01/03/2020 9:00 A M Medical Record Number: [id number] Patient Account Number: [id number] Date of Birth/Sex: Treating RN: [birth year] (59 y.o. Judie Petit) Yevonne Pax Primary Care Donye Dauenhauer: Arnette Felts Other Clinician: Referring Ura Yingling: Treating Likisha Alles/Extender: Anise Salvo Weeks in Treatment: 0 Foot Assessment Items Site Locations + = Sensation present, - = Sensation absent, C = Callus, U = Ulcer R = Redness, W = Warmth, M = Maceration, PU = Pre-ulcerative lesion F = Fissure, S = Swelling, D = Dryness Assessment Right: Left: Other Deformity: No No Prior Foot Ulcer: No No Prior Amputation: No No Charcot Joint: No No Ambulatory Status: Ambulatory Without Help Gait: Steady Electronic Signature(s) Signed: 01/03/2020 5:42:44 PM By:  Yevonne Pax RN Entered By: Yevonne Pax on 01/03/2020 09:49:06 -------------------------------------------------------------------------------- Nutrition Risk Screening Details Patient Name: Date of Service: [place], [place] V ID L. 01/03/2020 9:00 A M Medical Record Number: [id number] Patient Account Number: [id number] Date of Birth/Sex: Treating RN: 1960-09-28 (59 y.o. Judie Petit) Yevonne Pax Primary Care Colin Norment: Arnette Felts Other Clinician: Referring Alys Dulak: Treating Earl Zellmer/Extender: Anise Salvo Weeks in Treatment: 0 Height (in): 66 Weight (lbs): 151 Body Mass Index (BMI): 24.4 Nutrition Risk Screening Items Score Screening NUTRITION RISK SCREEN: I have an illness or condition that made me change the kind and/or amount of food I eat 0 No I eat fewer than two meals per day 0 No I eat few fruits and vegetables, or milk products 0 No I have three or more drinks of beer, liquor or wine almost every day 0 No I have tooth or mouth problems that make it hard for me to eat 0 No I don't always have enough money to buy the food I need 0 No I eat alone most of the time 0 No I take three or more different prescribed or over-the-counter drugs a day 1 Yes Without wanting to, I have lost or gained 10 pounds in the last six months 0 No I am not always physically able to shop, cook and/or feed myself 0 No Nutrition Protocols Good Risk Protocol 0 No interventions needed Moderate Risk Protocol High Risk Proctocol Risk Level: Good Risk Score: 1 Electronic Signature(s) Signed: 01/03/2020 5:42:44 PM By: Yevonne Pax RN Entered By: Yevonne Pax on 01/03/2020 09:43:06

## 2020-01-06 ENCOUNTER — Encounter: Payer: Managed Care, Other (non HMO) | Admitting: Nurse Practitioner

## 2020-01-06 NOTE — Progress Notes (Signed)
MARKIES, MOWATT ([id number]) Visit Report for 01/03/2020 Allergy List Details Patient Name: Date of Service: CO [place], [place] V ID L. 01/03/2020 9:00 A M Medical Record Number: [id number] Patient Account Number: [id number] Date of Birth/Sex: Treating RN: September 06, 1960 (59 y.o. Judie Petit) Yevonne Pax Primary Care Verdis Bassette: Arnette Felts Other Clinician: Referring Lunetta Marina: Treating Shemeka Wardle/Extender: Neill Loft, Lolita Cram Weeks in Treatment: 0 Allergies Active Allergies aspirin garlic Allergy Notes Electronic Signature(s) Signed: 01/03/2020 5:42:44 PM By: Yevonne Pax RN Entered By: Yevonne Pax on 01/03/2020 09:37:09 -------------------------------------------------------------------------------- Arrival Information Details Patient Name: Date of Service: [place], [place] V ID L. 01/03/2020 9:00 A M Medical Record Number: [id number] Patient Account Number: [id number] Date of Birth/Sex: Treating RN: 10/25/1960 (59 y.o. Melonie [place] Primary Care Chelisa Hennen: Arnette Felts Other Clinician: Referring Keaden Gunnoe: Treating Jerick Khachatryan/Extender: Pauline Good in Treatment: 0 Visit Information Patient Arrived: Ambulatory Arrival Time: 09:35 Accompanied By: self Transfer Assistance: None Patient Identification Verified: Yes Secondary Verification Process Completed: Yes Patient Requires Transmission-Based Precautions: No Patient Has Alerts: Yes Patient Alerts: Patient on Blood Thinner Electronic Signature(s) Signed: 01/03/2020 5:42:44 PM By: Yevonne Pax RN Entered By: Yevonne Pax on 01/03/2020 09:36:14 -------------------------------------------------------------------------------- Clinic Level of Care Assessment Details Patient Name: Date of Service: [place], [place] V ID L. 01/03/2020 9:00 A M Medical Record Number: [id number] Patient Account Number: [id number] Date of Birth/Sex: Treating RN: Sep 30, 1960 (59 y.o. Damaris Schooner Primary Care Marlita Keil: Arnette Felts Other  Clinician: Referring Lyan Holck: Treating Jule Schlabach/Extender: Pauline Good in Treatment: 0 Clinic Level of Care Assessment Items TOOL 2 Quantity Score []  - 0 Use when only an EandM is performed on the INITIAL visit ASSESSMENTS - Nursing Assessment / Reassessment X- 1 20 General Physical Exam (combine w/ comprehensive assessment (listed just below) when performed on new pt. evals) X- 1 25 Comprehensive Assessment (HX, ROS, Risk Assessments, Wounds Hx, etc.) ASSESSMENTS - Wound and Skin A ssessment / Reassessment X - Simple Wound Assessment / Reassessment - one wound 1 5 []  - 0 Complex Wound Assessment / Reassessment - multiple wounds []  - 0 Dermatologic / Skin Assessment (not related to wound area) ASSESSMENTS - Ostomy and/or Continence Assessment and Care []  - 0 Incontinence Assessment and Management []  - 0 Ostomy Care Assessment and Management (repouching, etc.) PROCESS - Coordination of Care X - Simple Patient / Family Education for ongoing care 1 15 []  - 0 Complex (extensive) Patient / Family Education for ongoing care X- 1 10 Staff obtains , Records, T Results / Process Orders est []  - 0 Staff telephones HHA, Nursing Homes / Clarify orders / etc []  - 0 Routine Transfer to another Facility (non-emergent condition) []  - 0 Routine Hospital Admission (non-emergent condition) X- 1 15 New Admissions / / Ordering NPWT Apligraf, etc. , []  - 0 Emergency Hospital Admission (emergent condition) X- 1 10 Simple Discharge Coordination []  - 0 Complex (extensive) Discharge Coordination PROCESS - Special Needs []  - 0 Pediatric / Minor Patient Management []  - 0 Isolation Patient Management []  - 0 Hearing / Language / Visual special needs []  - 0 Assessment of Community assistance (transportation, D/C planning, etc.) []  - 0 Additional assistance / Altered mentation []  - 0 Support Surface(s) Assessment (bed, cushion, seat,  etc.) INTERVENTIONS - Wound Cleansing / Measurement X- 1 5 Wound Imaging (photographs - any number of wounds) []  - 0 Wound Tracing (instead of photographs) X- 1 5 Simple Wound Measurement - one wound []  - 0 Complex Wound Measurement -  multiple wounds X- 1 5 Simple Wound Cleansing - one wound []  - 0 Complex Wound Cleansing - multiple wounds INTERVENTIONS - Wound Dressings X - Small Wound Dressing one or multiple wounds 1 10 []  - 0 Medium Wound Dressing one or multiple wounds []  - 0 Large Wound Dressing one or multiple wounds []  - 0 Application of Medications - injection INTERVENTIONS - Miscellaneous []  - 0 External ear exam []  - 0 Specimen Collection (cultures, biopsies, blood, body fluids, etc.) []  - 0 Specimen(s) / Culture(s) sent or taken to Lab for analysis []  - 0 Patient Transfer (multiple staff / Nurse, adultHoyer Lift / Similar devices) []  - 0 Simple Staple / Suture removal (25 or less) []  - 0 Complex Staple / Suture removal (26 or more) []  - 0 Hypo / Hyperglycemic Management (close monitor of Blood Glucose) []  - 0 Ankle / Brachial Index (ABI) - do not check if billed separately Has the patient been seen at the hospital within the last three years: Yes Total Score: 125 Level Of Care: New/Established - Level 4 Electronic Signature(s) Signed: 01/03/2020 5:58:22 PM By: Zenaida DeedBoehlein, Linda RN, BSN Entered By: Zenaida DeedBoehlein, Linda on 01/03/2020 10:24:00 -------------------------------------------------------------------------------- Encounter Discharge Information Details Patient Name: Date of Service: [place]. 01/03/2020 9:00 A M Medical Record Number: [id number] Patient Account Number: [id number] Date of Birth/Sex: Treating RN: 08/06/1960 (59 y.o. Tammy SoursM) Deaton, Bobbi Primary Care Davaun Quintela: Arnette FeltsMoore, Janece Other Clinician: Referring Quynn Vilchis: Treating Donae Kueker/Extender: Pauline Goodobson, Michael Moore, Janece Weeks in Treatment: 0 Encounter Discharge Information Items Discharge  Condition: Stable Ambulatory Status: Ambulatory Discharge Destination: Home Transportation: Private Auto Accompanied By: self Schedule Follow-up Appointment: Yes Clinical Summary of Care: Electronic Signature(s) Signed: 01/03/2020 5:09:42 PM By: Shawn Stalleaton, Bobbi Entered By: Shawn Stalleaton, Bobbi on 01/03/2020 12:47:50 -------------------------------------------------------------------------------- Lower Extremity Assessment Details Patient Name: Date of Service: [place]. 01/03/2020 9:00 A M Medical Record Number: [id number] Patient Account Number: [id number] Date of Birth/Sex: Treating RN: 09/27/1960 (59 y.o. Melonie FloridaM) Epps, Carrie Primary Care Takeira Yanes: Arnette FeltsMoore, Janece Other Clinician: Referring Madicyn Mesina: Treating Foster Sonnier/Extender: Anise Salvoobson, Michael Moore, Janece Weeks in Treatment: 0 Edema Assessment Assessed: Kyra Searles[Left: No] [Right: No] E[Left: dema] [Right: :] Calf Left: Right: Point of Measurement: 42 cm From Medial Instep 38.5 cm Ankle Left: Right: Point of Measurement: 8 cm From Medial Instep 24 cm Vascular Assessment Pulses: Dorsalis Pedis Palpable: [Left:Yes] Electronic Signature(s) Signed: 01/03/2020 5:42:44 PM By: Yevonne PaxEpps, Carrie RN Entered By: Yevonne PaxEpps, Carrie on 01/03/2020 09:49:43 -------------------------------------------------------------------------------- Multi Wound Chart Details Patient Name: Date of Service: [place], [place] V [place] L. 01/03/2020 9:00 A M Medical Record Number: [id number] Patient Account Number: [id number] Date of Birth/Sex: Treating RN: 02/16/1961 (59 y.o. Damaris SchoonerM) Boehlein, Linda Primary Care Jefferey Lippmann: Arnette FeltsMoore, Janece Other Clinician: Referring Swathi Dauphin: Treating Porfiria Heinrich/Extender: Pauline Goodobson, Michael Moore, Janece Weeks in Treatment: 0 Vital Signs Height(in): 66 Pulse(bpm): 92 Weight(lbs): 151 Blood Pressure(mmHg): 137/83 Body Mass Index(BMI): 24 Temperature(F): 99.2 Respiratory Rate(breaths/min): 18 Photos: [1:No Photos Left Foot] [N/A:N/A N/A] Wound  Location: [1:Gradually Appeared] [N/A:N/A] Wounding Event: [1:Arterial Insufficiency Ulcer] [N/A:N/A] Primary Etiology: [1:Hypertension, Peripheral Arterial] [N/A:N/A] Comorbid History: [1:Disease 10/23/2019] [N/A:N/A] Date Acquired: [1:0] [N/A:N/A] Weeks of Treatment: [1:Open] [N/A:N/A] Wound Status: [1:0.7x0.7x0.1] [N/A:N/A] Measurements L x W x D (cm) [1:0.385] [N/A:N/A] A (cm) : rea [1:0.038] [N/A:N/A] Volume (cm) : [1:Full Thickness With Exposed Support] [N/A:N/A] Classification: [1:Structures Medium] [N/A:N/A] Exudate Amount: [1:Serosanguineous] [N/A:N/A] Exudate Type: [1:red, brown] [N/A:N/A] Exudate Color: [1:Large (67-100%)] [N/A:N/A] Granulation Amount: [1:Red] [N/A:N/A] Granulation Quality: [1:None Present (0%)] [N/A:N/A] Necrotic Amount: [1:Fascia:  No] [N/A:N/A] Exposed Structures: [1:Fat Layer (Subcutaneous Tissue): No Tendon: No Muscle: No Joint: No Bone: No None] [N/A:N/A] Treatment Notes Electronic Signature(s) Signed: 01/03/2020 5:58:22 PM By: Zenaida Deed RN, BSN Signed: 01/06/2020 9:07:52 AM By: Baltazar Najjar MD Entered By: Baltazar Najjar on 01/03/2020 10:38:01 -------------------------------------------------------------------------------- Multi-Disciplinary Care Plan Details Patient Name: Date of Service: [place] [address] [place], [place] V ID L. 01/03/2020 9:00 A M Medical Record Number: [id number] Patient Account Number: [id number] Date of Birth/Sex: Treating RN: 09-11-1960 (59 y.o. Damaris Schooner Primary Care Rylie Knierim: Arnette Felts Other Clinician: Referring Lerline Valdivia: Treating Chibueze Beasley/Extender: Pauline Good in Treatment: 0 Active Inactive Tissue Oxygenation Nursing Diagnoses: Actual ineffective tissue perfusion; peripheral (select once diagnosis is confirmed) Knowledge deficit related to disease process and management Goals: Patient/caregiver will verbalize understanding of disease process and disease management Date Initiated:  01/03/2020 Target Resolution Date: 01/31/2020 Goal Status: Active Interventions: Assess patient understanding of disease process and management upon diagnosis and as needed Assess peripheral arterial status upon admission and as needed Provide education on tissue oxygenation and ischemia Treatment Activities: Non-invasive vascular studies : 01/03/2020 Notes: Wound/Skin Impairment Nursing Diagnoses: Impaired tissue integrity Knowledge deficit related to ulceration/compromised skin integrity Goals: Patient/caregiver will verbalize understanding of skin care regimen Date Initiated: 01/03/2020 Target Resolution Date: 01/31/2020 Goal Status: Active Ulcer/skin breakdown will have a volume reduction of 30% by week 4 Date Initiated: 01/03/2020 Target Resolution Date: 01/31/2020 Goal Status: Active Interventions: Assess patient/caregiver ability to obtain necessary supplies Assess patient/caregiver ability to perform ulcer/skin care regimen upon admission and as needed Assess ulceration(s) every visit Treatment Activities: Skin care regimen initiated : 01/03/2020 Topical wound management initiated : 01/03/2020 Notes: Electronic Signature(s) Signed: 01/03/2020 5:58:22 PM By: Zenaida Deed RN, BSN Entered By: Zenaida Deed on 01/03/2020 10:22:31 -------------------------------------------------------------------------------- Pain Assessment Details Patient Name: Date of Service: [place], [place] V [place] L. 01/03/2020 9:00 A M Medical Record Number: [id number] Patient Account Number: [id number] Date of Birth/Sex: Treating RN: [birth year] (59 y.o. Melonie [place] Primary Care Satya Buttram: Arnette Felts Other Clinician: Referring Casidee Jann: Treating Pierra Skora/Extender: Anise Salvo Weeks in Treatment: 0 Active Problems Location of Pain Severity and Description of Pain Patient Has Paino No Site Locations Pain Management and Medication Current Pain Management: Electronic  Signature(s) Signed: 01/03/2020 5:42:44 PM By: Yevonne Pax RN Entered By: Yevonne Pax on 01/03/2020 09:51:14 -------------------------------------------------------------------------------- Patient/Caregiver Education Details Patient Name: Date of Service: [place], [place] V ID L. 11/12/2021andnbsp9:00 A M Medical Record Number: [id number] Patient Account Number: [id number] Date of Birth/Gender: Treating RN: 03-May-1960 (59 y.o. Damaris Schooner Primary Care Physician: Arnette Felts Other Clinician: Referring Physician: Treating Physician/Extender: Pauline Good in Treatment: 0 Education Assessment Education Provided To: Patient Education Topics Provided Tissue Oxygenation: Methods: Explain/Verbal Responses: Reinforcements needed, State content correctly [facility]: o Handouts: [facility] o Methods: Explain/Verbal, Printed Responses: Reinforcements needed, State content correctly Wound/Skin Impairment: Handouts: Caring for Your Ulcer, Skin Care Do's and Dont's, Smoking and Wound Healing Methods: Explain/Verbal, Printed Responses: Reinforcements needed, State content correctly Electronic Signature(s) Signed: 01/03/2020 5:58:22 PM By: Zenaida Deed RN, BSN Entered By: Zenaida Deed on 01/03/2020 10:23:12 -------------------------------------------------------------------------------- Wound Assessment Details Patient Name: Date of Service: [place], [place] V ID L. 01/03/2020 9:00 A M Medical Record Number: [id number] Patient Account Number: [id number] Date of Birth/Sex: Treating RN: 1960/03/14 (59 y.o. Melonie [place] Primary Care Jahzaria Vary: Arnette Felts Other Clinician: Referring Takira Sherrin: Treating Okey Zelek/Extender: Neill Loft, Freida Busman  in Treatment: 0 Wound Status Wound Number: 1 Primary Etiology: Arterial Insufficiency Ulcer Wound Location: Left Foot Wound Status: Open Wounding Event:  Gradually Appeared Comorbid History: Hypertension, Peripheral Arterial Disease Date Acquired: 10/23/2019 Weeks Of Treatment: 0 Clustered Wound: No Wound Measurements Length: (cm) 0.7 Width: (cm) 0.7 Depth: (cm) 0.1 Area: (cm) 0.385 Volume: (cm) 0.038 % Reduction in Area: % Reduction in Volume: Epithelialization: None Tunneling: No Undermining: No Wound Description Classification: Full Thickness With Exposed Support Structures Exudate Amount: Medium Exudate Type: Serosanguineous Exudate Color: red, brown Foul Odor After Cleansing: No Slough/Fibrino No Wound Bed Granulation Amount: Large (67-100%) Exposed Structure Granulation Quality: Red Fascia Exposed: No Necrotic Amount: None Present (0%) Fat Layer (Subcutaneous Tissue) Exposed: No Tendon Exposed: No Muscle Exposed: No Joint Exposed: No Bone Exposed: No Treatment Notes Wound #1 (Left Foot) 1. Cleanse With Wound Cleanser 2. Periwound Care Skin Prep 3. Primary Dressing Applied Collegen AG Hydrogel or K-Y Jelly 4. Secondary Dressing Foam Border Dressing 5. Secured With Advice worker) Signed: 01/03/2020 5:42:44 PM By: Yevonne Pax RN Entered By: Yevonne Pax on 01/03/2020 09:51:05 -------------------------------------------------------------------------------- Vitals Details Patient Name: Date of Service: [place], [place] V ID L. 01/03/2020 9:00 A M Medical Record Number: [id number] Patient Account Number: [id number] Date of Birth/Sex: Treating RN: 09-Mar-1960 (59 y.o. Judie Petit) Yevonne Pax Primary Care Vearl Allbaugh: Arnette Felts Other Clinician: Referring Jazariah Teall: Treating Karna Abed/Extender: Pauline Good in Treatment: 0 Vital Signs Time Taken: 09:36 Temperature (F): 99.2 Height (in): 66 Pulse (bpm): 92 Source: Stated Respiratory Rate (breaths/min): 18 Weight (lbs): 151 Blood Pressure (mmHg): 137/83 Source: Stated Reference Range: 80 - 120 mg / dl Body  Mass Index (BMI): 24.4 Electronic Signature(s) Signed: 01/03/2020 5:42:44 PM By: Yevonne Pax RN Entered By: Yevonne Pax on 01/03/2020 09:36:45

## 2020-01-06 NOTE — Progress Notes (Signed)
Benjamin [name], Benjamin [name] ([id number]) Visit Report for 01/03/2020 Chief Complaint Document Details Patient Name: Date of Service: Benjamin Haynes, Benjamin V ID L. 01/03/2020 9:00 A M Medical Record Number: [id number] Patient Account Number: [id number] Date of Birth/Sex: Treating RN: 1960-04-16 (59 y.o. Damaris Schooner Primary Care Provider: Arnette Felts Other Clinician: Referring Provider: Treating Provider/Extender: Pauline Good in Treatment: 0 Information Obtained from: Patient Chief Complaint 01/03/2020; patient is here for review of a wound on the left dorsal foot Electronic Signature(s) Signed: 01/06/2020 9:07:52 AM By: Baltazar Najjar MD Entered By: Baltazar Najjar on 01/03/2020 10:39:14 -------------------------------------------------------------------------------- Debridement Details Patient Name: Date of Service: Benjamin [name], Benjamin V ID L. 01/03/2020 9:00 A M Medical Record Number: [id number] Patient Account Number: [id number] Date of Birth/Sex: Treating RN: [birth year] (59 y.o. Damaris Schooner Primary Care Provider: Arnette Felts Other Clinician: Referring Provider: Treating Provider/Extender: Pauline Good in Treatment: 0 Debridement Performed for Assessment: Wound #1 Left Foot Performed By: Clinician Zenaida Deed, RN Debridement Type: Chemical/Enzymatic/Mechanical Agent Used: anasept and gauze Severity of Tissue Pre Debridement: Fat layer exposed Level of Consciousness (Pre-procedure): Awake and Alert Pre-procedure Verification/Time Out Yes - 09:15 Taken: Bleeding: None Response to Treatment: Procedure was tolerated well Level of Consciousness (Post- Awake and Alert procedure): Post Debridement Measurements of Total Wound Length: (cm) 0.7 Width: (cm) 0.7 Depth: (cm) 0.1 Volume: (cm) 0.038 Character of Wound/Ulcer Post Debridement: Improved Severity of Tissue Post Debridement: Fat layer exposed Post Procedure Diagnosis Same as  Pre-procedure Electronic Signature(s) Signed: 01/03/2020 5:58:22 PM By: Zenaida Deed RN, BSN Signed: 01/06/2020 9:07:52 AM By: Baltazar Najjar MD Entered By: Zenaida Deed on 01/03/2020 13:59:04 -------------------------------------------------------------------------------- HPI Details Patient Name: Date of Service: Benjamin [name], Benjamin V ID L. 01/03/2020 9:00 A M Medical Record Number: [id number] Patient Account Number: [id number] Date of Birth/Sex: Treating RN: 25-Apr-1960 (59 y.o. Damaris Schooner Primary Care Provider: Arnette Felts Other Clinician: Referring Provider: Treating Provider/Extender: Pauline Good in Treatment: 0 History of Present Illness HPI Description: 01/03/2020 This is a 59 year old man with a past medical history of peripheral artery disease. He had a past history of undergoing bilateral common iliac artery stenting. He is not a diabetic but he is a half a pack per day smoker. He developed severe claudication and in September on 11/13/2019 he underwent a left to right femoral artery bypass graft, a left femoral to below-knee popliteal bypass with vein graft by Dr. Myra Gianotti. He required a rePete procedure on 9/27 with a left groin angioplasty of the left femoropopliteal vein graft. He has been followed in vein and vascular since then. He saw Dr. Myra Gianotti on 12/02/2019 noted a left great toe wound using Santyl. He arrives in clinic today with a small wound over the dorsal left foot just proximal to the left first metatarsal head. This had a black eschar on it which was removed by her intake nurse when washing his foot he has a nice clean wound underneath. The patient had postoperative arterial tests on 9/24 this showed an ABI of 0.69 on the right with a TBI in the right of 0.32 and monophasic waveforms. On the left his ABI was 0.7 monophasic but with a great toe pressure of 0. The patient weeks works at [employer]. He does have insurance. He states to  our nurses that he is not really taking the majority of his medications including Plavix because of cost issues. Electronic Signature(s) Signed: 01/06/2020 9:07:52 AM By: Baltazar Najjar MD  Entered By: Baltazar Najjar on 01/03/2020 10:43:39 -------------------------------------------------------------------------------- Physical Exam Details Patient Name: Date of Service: Benjamin [name], Benjamin V ID L. 01/03/2020 9:00 A M Medical Record Number: [id number] Patient Account Number: [id number] Date of Birth/Sex: Treating RN: [birth year] (59 y.o. Damaris Schooner Primary Care Provider: Arnette Felts Other Clinician: Referring Provider: Treating Provider/Extender: Pauline Good in Treatment: 0 Constitutional Sitting or standing Blood Pressure is within target range for patient.. Pulse regular and within target range for patient.Marland Kitchen Respirations regular, non-labored and within target range.. Temperature is normal and within the target range for the patient.Marland Kitchen Appears in no distress. Respiratory work of breathing is normal. Cardiovascular Pulses are reduced in the left foot but palpable. His foot is warm. Notes Wound exam; the patient has a very clean looking wound on the dorsal foot just proximal to the first metatarsal head. This already has rims of epithelialization granulation looks healthy no evidence of infection is seen Electronic Signature(s) Signed: 01/06/2020 9:07:52 AM By: Baltazar Najjar MD Entered By: Baltazar Najjar on 01/03/2020 10:44:52 -------------------------------------------------------------------------------- Physician Orders Details Patient Name: Date of Service: Benjamin [name], Benjamin V ID L. 01/03/2020 9:00 A M Medical Record Number: [id number] Patient Account Number: [id number] Date of Birth/Sex: Treating RN: 22-Feb-1961 (59 y.o. Damaris Schooner Primary Care Provider: Arnette Felts Other Clinician: Referring Provider: Treating Provider/Extender: Pauline Good in Treatment: 0 Verbal / Phone Orders: No Diagnosis Coding Follow-up Appointments Return appointment in 3 weeks. Dressing Change Frequency Wound #1 Left Foot Change Dressing every other day. Wound Cleansing May shower and wash wound with soap and water. Primary Wound Dressing Wound #1 Left Foot Silver Collagen - moisten with hydrogel or KY gel Secondary Dressing Wound #1 Left Foot Foam Border Electronic Signature(s) Signed: 01/03/2020 5:58:22 PM By: Zenaida Deed RN, BSN Signed: 01/06/2020 9:07:52 AM By: Baltazar Najjar MD Entered By: Zenaida Deed on 01/03/2020 10:25:16 -------------------------------------------------------------------------------- Problem List Details Patient Name: Date of Service: Benjamin [name], Benjamin V ID L. 01/03/2020 9:00 A M Medical Record Number: [id number] Patient Account Number: [id number] Date of Birth/Sex: Treating RN: 08-02-1960 (59 y.o. Damaris Schooner Primary Care Provider: Arnette Felts Other Clinician: Referring Provider: Treating Provider/Extender: Pauline Good in Treatment: 0 Active Problems ICD-10 Encounter Code Description Active Date MDM Diagnosis I70.245 Atherosclerosis of native arteries of left leg with ulceration of other part of 01/03/2020 No Yes foot L97.522 Non-pressure chronic ulcer of other part of left foot with fat layer exposed 01/03/2020 No Yes Inactive Problems Resolved Problems Electronic Signature(s) Signed: 01/06/2020 9:07:52 AM By: Baltazar Najjar MD Entered By: Baltazar Najjar on 01/03/2020 10:37:47 -------------------------------------------------------------------------------- Progress Note Details Patient Name: Date of Service: Benjamin [name], Benjamin V ID L. 01/03/2020 9:00 A M Medical Record Number: [id number] Patient Account Number: [id number] Date of Birth/Sex: Treating RN: March 04, 1960 (59 y.o. Damaris Schooner Primary Care Provider: Arnette Felts Other  Clinician: Referring Provider: Treating Provider/Extender: Pauline Good in Treatment: 0 Subjective Chief Complaint Information obtained from Patient 01/03/2020; patient is here for review of a wound on the left dorsal foot History of Present Illness (HPI) 01/03/2020 This is a 59 year old man with a past medical history of peripheral artery disease. He had a past history of undergoing bilateral common iliac artery stenting. He is not a diabetic but he is a half a pack per day smoker. He developed severe claudication and in September on 11/13/2019 he underwent a left to right femoral artery bypass graft, a left femoral  to below-knee popliteal bypass with vein graft by Dr. Myra Gianotti. He required a rePete procedure on 9/27 with a left groin angioplasty of the left femoropopliteal vein graft. He has been followed in vein and vascular since then. He saw Dr. Myra Gianotti on 12/02/2019 noted a left great toe wound using Santyl. He arrives in clinic today with a small wound over the dorsal left foot just proximal to the left first metatarsal head. This had a black eschar on it which was removed by her intake nurse when washing his foot he has a nice clean wound underneath. The patient had postoperative arterial tests on 9/24 this showed an ABI of 0.69 on the right with a TBI in the right of 0.32 and monophasic waveforms. On the left his ABI was 0.7 monophasic but with a great toe pressure of 0. The patient weeks works at [employer]. He does have insurance. He states to our nurses that he is not really taking the majority of his medications including Plavix because of cost issues. Patient History Information obtained from Patient. Allergies aspirin, garlic Family History No family history of Cancer, Diabetes, Heart Disease, Hereditary Spherocytosis, Hypertension, Kidney Disease, Lung Disease, Seizures, Stroke, Thyroid Problems, Tuberculosis. Social History Current every day smoker,  Marital Status - Single, Alcohol Use - Never, Drug Use - No History, Caffeine Use - Daily. Medical History Eyes Denies history of Cataracts, Glaucoma, Optic Neuritis Ear/Nose/Mouth/Throat Denies history of Chronic sinus problems/congestion, Middle ear problems Hematologic/Lymphatic Denies history of Anemia, Hemophilia, Human Immunodeficiency Virus, Lymphedema, Sickle Cell Disease Respiratory Denies history of Aspiration, Asthma, Chronic Obstructive Pulmonary Disease (COPD), Pneumothorax, Sleep Apnea, Tuberculosis Cardiovascular Patient has history of Hypertension, Peripheral Arterial Disease Denies history of Angina, Arrhythmia, Congestive Heart Failure, Coronary Artery Disease, Deep Vein Thrombosis, Hypotension, Myocardial Infarction, Peripheral Venous Disease, Phlebitis, Vasculitis Gastrointestinal Denies history of Cirrhosis , Colitis, Crohnoos, Hepatitis A, Hepatitis B, Hepatitis C Endocrine Denies history of Type I Diabetes, Type II Diabetes Genitourinary Denies history of End Stage Renal Disease Immunological Denies history of Lupus Erythematosus, Raynaudoos, Scleroderma Integumentary (Skin) Denies history of History of Burn Musculoskeletal Denies history of Gout, Rheumatoid Arthritis, Osteoarthritis, Osteomyelitis Neurologic Denies history of Dementia, Neuropathy, Quadriplegia, Paraplegia, Seizure Disorder Oncologic Denies history of Received Chemotherapy, Received Radiation Psychiatric Denies history of Anorexia/bulimia, Confinement Anxiety Review of Systems (ROS) Constitutional Symptoms (General Health) Denies complaints or symptoms of Fatigue, Fever, Chills, Marked Weight Change. Eyes Denies complaints or symptoms of Dry Eyes, Vision Changes, Glasses / Contacts. Ear/Nose/Mouth/Throat Denies complaints or symptoms of Chronic sinus problems or rhinitis. Respiratory Denies complaints or symptoms of Chronic or frequent coughs, Shortness of  Breath. Cardiovascular Denies complaints or symptoms of Chest pain. Gastrointestinal Denies complaints or symptoms of Frequent diarrhea, Nausea, Vomiting. Endocrine Denies complaints or symptoms of Heat/cold intolerance. Genitourinary Denies complaints or symptoms of Frequent urination. Integumentary (Skin) Complains or has symptoms of Wounds. Musculoskeletal Denies complaints or symptoms of Muscle Pain, Muscle Weakness. Neurologic Denies complaints or symptoms of Numbness/parasthesias. Psychiatric Denies complaints or symptoms of Claustrophobia, Suicidal. Objective Constitutional Sitting or standing Blood Pressure is within target range for patient.. Pulse regular and within target range for patient.Marland Kitchen Respirations regular, non-labored and within target range.. Temperature is normal and within the target range for the patient.Marland Kitchen Appears in no distress. Vitals Time Taken: 9:36 AM, Height: 66 in, Source: Stated, Weight: 151 lbs, Source: Stated, BMI: 24.4, Temperature: 99.2 F, Pulse: 92 bpm, Respiratory Rate: 18 breaths/min, Blood Pressure: 137/83 mmHg. Respiratory work of breathing is normal. Cardiovascular Pulses are reduced  in the left foot but palpable. His foot is warm. General Notes: Wound exam; the patient has a very clean looking wound on the dorsal foot just proximal to the first metatarsal head. This already has rims of epithelialization granulation looks healthy no evidence of infection is seen Integumentary (Hair, Skin) Wound #1 status is Open. Original cause of wound was Gradually Appeared. The wound is located on the Left Foot. The wound measures 0.7cm length x 0.7cm width x 0.1cm depth; 0.385cm^2 area and 0.038cm^3 volume. There is no tunneling or undermining noted. There is a medium amount of serosanguineous drainage noted. There is large (67-100%) red granulation within the wound bed. There is no necrotic tissue within the wound bed. 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 Dressing Change Frequency: Wound #1 Left Foot: Change Dressing every other day. Wound Cleansing: May shower and wash wound with soap and water. Primary Wound Dressing: Wound #1 Left Foot: Silver Collagen - moisten with hydrogel or KY gel Secondary Dressing: Wound #1 Left Foot: Foam Border 1. I change his primary dressing to silver collagen there did not seem to be any good reason for Santyl at this moment. We went over this with him. The moistening agent can be K-Y jelly which is inexpensive. 2. I talked to him about the affordability of his medications he is both on both Plavix and Pletal. I told him I thought these were important especially the Plavix. 3. I did talk to him about cigarette smoking he expressed understanding. I spent 30 minutes on this patient's past medical history, face-to-face evaluation, review of his angiograms, noninvasive arterial studies face-to-face evaluation and preparation of this record Electronic Signature(s) Signed: 01/06/2020 9:07:52 AM By: Baltazar Najjar MD Entered By: Baltazar Najjar on 01/03/2020 10:46:44 -------------------------------------------------------------------------------- HxROS Details Patient Name: Date of Service: Benjamin [name], Benjamin V ID L. 01/03/2020 9:00 A M Medical Record Number: [id number] Patient Account Number: [id number] Date of Birth/Sex: Treating RN: February 28, 1960 (59 y.o. Benjamin [name] Primary Care Provider: Arnette Felts Other Clinician: Referring Provider: Treating Provider/Extender: Pauline Good in Treatment: 0 Information Obtained From Patient Constitutional Symptoms (General Health) Complaints and Symptoms: Negative for: Fatigue; Fever; Chills; Marked Weight  Change Eyes Complaints and Symptoms: Negative for: Dry Eyes; Vision Changes; Glasses / Contacts Medical History: Negative for: Cataracts; Glaucoma; Optic Neuritis Ear/Nose/Mouth/Throat Complaints and Symptoms: Negative for: Chronic sinus problems or rhinitis Medical History: Negative for: Chronic sinus problems/congestion; Middle ear problems Respiratory Complaints and Symptoms: Negative for: Chronic or frequent coughs; Shortness of Breath Medical History: Negative for: Aspiration; Asthma; Chronic Obstructive Pulmonary Disease (COPD); Pneumothorax; Sleep Apnea; Tuberculosis Cardiovascular Complaints and Symptoms: Negative for: Chest pain Medical History: Positive for: Hypertension; Peripheral Arterial Disease Negative for: Angina; Arrhythmia; Congestive Heart Failure; Coronary Artery Disease; Deep Vein Thrombosis; Hypotension; Myocardial Infarction; Peripheral Venous Disease; Phlebitis; Vasculitis Gastrointestinal Complaints and Symptoms: Negative for: Frequent diarrhea; Nausea; Vomiting Medical History: Negative for: Cirrhosis ; Colitis; Crohns; Hepatitis A; Hepatitis B; Hepatitis C Endocrine Complaints and Symptoms: Negative for: Heat/cold intolerance Medical History: Negative for: Type I Diabetes; Type II Diabetes Genitourinary Complaints and Symptoms: Negative for: Frequent urination Medical History: Negative for: End Stage Renal Disease Integumentary (Skin) Complaints and Symptoms: Positive for: Wounds Medical History: Negative for: History of Burn Musculoskeletal Complaints and Symptoms: Negative for: Muscle Pain; Muscle Weakness  Medical History: Negative for: Gout; Rheumatoid Arthritis; Osteoarthritis; Osteomyelitis Neurologic Complaints and Symptoms: Negative for: Numbness/parasthesias Medical History: Negative for: Dementia; Neuropathy; Quadriplegia; Paraplegia; Seizure Disorder Psychiatric Complaints and Symptoms: Negative for: Claustrophobia;  Suicidal Medical History: Negative for: Anorexia/bulimia; Confinement Anxiety Hematologic/Lymphatic Medical History: Negative for: Anemia; Hemophilia; Human Immunodeficiency Virus; Lymphedema; Sickle Cell Disease Immunological Medical History: Negative for: Lupus Erythematosus; Raynauds; Scleroderma Oncologic Medical History: Negative for: Received Chemotherapy; Received Radiation Immunizations Pneumococcal Vaccine: Received Pneumococcal Vaccination: No Implantable Devices None Family and Social History Cancer: No; Diabetes: No; Heart Disease: No; Hereditary Spherocytosis: No; Hypertension: No; Kidney Disease: No; Lung Disease: No; Seizures: No; Stroke: No; Thyroid Problems: No; Tuberculosis: No; Current every day smoker; Marital Status - Single; Alcohol Use: Never; Drug Use: No History; Caffeine Use: Daily; Financial Concerns: No; Food, Clothing or Shelter Needs: No; Support System Lacking: No; Transportation Concerns: No Electronic Signature(s) Signed: 01/03/2020 5:42:44 PM By: Yevonne PaxEpps, Carrie RN Signed: 01/06/2020 9:07:52 AM By: Baltazar Najjarobson, Evelyn Moch MD Entered By: Yevonne PaxEpps, Carrie on 01/03/2020 09:41:18 -------------------------------------------------------------------------------- SuperBill Details Patient Name: Date of Service: Benjamin Benjamin [place], [place] V ID L. 01/03/2020 Medical Record Number: [id number] Patient Account Number: [id number] Date of Birth/Sex: Treating RN: 12/08/1960 (59 y.o. 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 Facility Procedures CPT4 Code: [id number] Description: 99214 - WOUND CARE VISIT-LEV 4 EST PT Modifier: Quantity:  1 Physician Procedures : CPT4 Code Description Modifier [id number] WC PHYS LEVEL 3 NEW PT ICD-10 Diagnosis Description I70.245 Atherosclerosis of native arteries of left leg with ulceration of other part of foot L97.522 Non-pressure chronic ulcer of other part of left foot with  fat layer exposed Quantity: 1 Electronic Signature(s) Signed: 01/03/2020 5:58:22 PM By: Zenaida DeedBoehlein, Linda RN, BSN Signed: 01/06/2020 9:07:52 AM By: Baltazar Najjarobson, Kayce Chismar MD Entered By: Zenaida DeedBoehlein, Linda on 01/03/2020 11:51:41

## 2020-01-23 ENCOUNTER — Encounter: Payer: Managed Care, Other (non HMO) | Admitting: Nurse Practitioner

## 2020-01-24 ENCOUNTER — Encounter (HOSPITAL_BASED_OUTPATIENT_CLINIC_OR_DEPARTMENT_OTHER): Payer: Managed Care, Other (non HMO) | Attending: Internal Medicine | Admitting: Internal Medicine

## 2020-01-27 ENCOUNTER — Telehealth: Payer: Self-pay

## 2020-01-27 NOTE — Telephone Encounter (Signed)
Telephone call received from pt regarding disability and left leg problems. Attempted to reach pt back to discuss, but no answer.

## 2020-01-31 ENCOUNTER — Other Ambulatory Visit: Payer: Self-pay

## 2020-01-31 ENCOUNTER — Encounter (HOSPITAL_BASED_OUTPATIENT_CLINIC_OR_DEPARTMENT_OTHER): Payer: Managed Care, Other (non HMO) | Admitting: Internal Medicine

## 2020-01-31 ENCOUNTER — Encounter (HOSPITAL_BASED_OUTPATIENT_CLINIC_OR_DEPARTMENT_OTHER): Payer: Managed Care, Other (non HMO) | Attending: Internal Medicine | Admitting: Internal Medicine

## 2020-01-31 DIAGNOSIS — F17210 Nicotine dependence, cigarettes, uncomplicated: Secondary | ICD-10-CM | POA: Insufficient documentation

## 2020-01-31 DIAGNOSIS — I70245 Atherosclerosis of native arteries of left leg with ulceration of other part of foot: Secondary | ICD-10-CM | POA: Insufficient documentation

## 2020-01-31 DIAGNOSIS — L97522 Non-pressure chronic ulcer of other part of left foot with fat layer exposed: Secondary | ICD-10-CM | POA: Diagnosis present

## 2020-02-03 ENCOUNTER — Other Ambulatory Visit: Payer: Self-pay

## 2020-02-03 DIAGNOSIS — Z720 Tobacco use: Secondary | ICD-10-CM

## 2020-02-03 DIAGNOSIS — I739 Peripheral vascular disease, unspecified: Secondary | ICD-10-CM

## 2020-02-03 DIAGNOSIS — I10 Essential (primary) hypertension: Secondary | ICD-10-CM

## 2020-02-03 MED ORDER — GABAPENTIN 100 MG PO CAPS: ORAL_CAPSULE | ORAL | 2 refills | Status: DC

## 2020-02-03 MED ORDER — BUPROPION HCL ER (XL) 150 MG PO TB24: 150.0000 mg | ORAL_TABLET | ORAL | 2 refills | Status: DC

## 2020-02-03 MED ORDER — AMLODIPINE BESYLATE 5 MG PO TABS: ORAL_TABLET | ORAL | 0 refills | Status: DC

## 2020-02-06 NOTE — Progress Notes (Signed)
Benjamin [name], Benjamin [name] ([id number]) Visit Report for 01/31/2020 Arrival Information Details Patient Name: Date of Service: CO [place], [place] V ID L. 01/31/2020 10:30 A M Medical Record Number: [id number] Patient Account Number: [id number] Date of Birth/Sex: Treating [name]: [birth year] (59 y.o. Benjamin [name]) Yevonne Pax Primary Care Benjamin [name]: Benjamin [name] Other Clinician: Referring Monserat Prestigiacomo: Treating Ayzia Day/Extender: Pauline Good in Treatment: 4 Visit Information History Since Last Visit All ordered tests and consults were completed: No Patient Arrived: Ambulatory Added or deleted any medications: No Arrival Time: 10:46 Any new allergies or adverse reactions: No Accompanied By: self Had a fall or experienced change in No Transfer Assistance: None activities of daily living that may affect Patient Identification Verified: Yes risk of falls: Secondary Verification Process Completed: Yes Signs or symptoms of abuse/neglect since last visito No Patient Requires Transmission-Based Precautions: No Hospitalized since last visit: No Patient Has Alerts: Yes Implantable device outside of the clinic excluding No Patient Alerts: Patient on Blood Thinner cellular tissue based products placed in the center since last visit: Has Dressing in Place as Prescribed: Yes Pain Present Now: No Electronic Signature(s) Signed: 01/31/2020 2:11:24 PM By: Yevonne Pax [name] Entered By: Yevonne Pax on 01/31/2020 10:47:32 -------------------------------------------------------------------------------- Clinic Level of Care Assessment Details Patient Name: Date of Service: CO [place], [place] V ID L. 01/31/2020 10:30 A M Medical Record Number: [id number] Patient Account Number: [id number] Date of Birth/Sex: Treating [name]: [birth year] (59 y.o. Benjamin [name] Primary Care Brynden Thune: Benjamin [name] Other Clinician: Referring Averyana Pillars: Treating Palak Tercero/Extender: Pauline Good in Treatment: 4 Clinic  Level of Care Assessment Items TOOL 4 Quantity Score X- 1 0 Use when only an EandM is performed on FOLLOW-UP visit ASSESSMENTS - Nursing Assessment / Reassessment X- 1 10 Reassessment of Co-morbidities (includes updates in patient status) X- 1 5 Reassessment of Adherence to Treatment Plan ASSESSMENTS - Wound and Skin A ssessment / Reassessment X - Simple Wound Assessment / Reassessment - one wound 1 5 []  - 0 Complex Wound Assessment / Reassessment - multiple wounds []  - 0 Dermatologic / Skin Assessment (not related to wound area) ASSESSMENTS - Focused Assessment []  - 0 Circumferential Edema Measurements - multi extremities []  - 0 Nutritional Assessment / Counseling / Intervention X- 1 5 Lower Extremity Assessment (monofilament, tuning fork, pulses) []  - 0 Peripheral Arterial Disease Assessment (using hand held doppler) ASSESSMENTS - Ostomy and/or Continence Assessment and Care []  - 0 Incontinence Assessment and Management []  - 0 Ostomy Care Assessment and Management (repouching, etc.) PROCESS - Coordination of Care X - Simple Patient / Family Education for ongoing care 1 15 []  - 0 Complex (extensive) Patient / Family Education for ongoing care X- 1 10 Staff obtains , Records, T Results / Process Orders est []  - 0 Staff telephones HHA, Nursing Homes / Clarify orders / etc []  - 0 Routine Transfer to another Facility (non-emergent condition) []  - 0 Routine Hospital Admission (non-emergent condition) []  - 0 New Admissions / / Ordering NPWT Apligraf, etc. , []  - 0 Emergency Hospital Admission (emergent condition) X- 1 10 Simple Discharge Coordination []  - 0 Complex (extensive) Discharge Coordination PROCESS - Special Needs []  - 0 Pediatric / Minor Patient Management []  - 0 Isolation Patient Management []  - 0 Hearing / Language / Visual special needs []  - 0 Assessment of Community assistance (transportation, D/C planning, etc.) []   - 0 Additional assistance / Altered mentation []  - 0 Support Surface(s) Assessment (bed, cushion, seat, etc.) INTERVENTIONS - Wound Cleansing /  Measurement X - Simple Wound Cleansing - one wound 1 5 []  - 0 Complex Wound Cleansing - multiple wounds X- 1 5 Wound Imaging (photographs - any number of wounds) []  - 0 Wound Tracing (instead of photographs) X- 1 5 Simple Wound Measurement - one wound []  - 0 Complex Wound Measurement - multiple wounds INTERVENTIONS - Wound Dressings []  - 0 Small Wound Dressing one or multiple wounds []  - 0 Medium Wound Dressing one or multiple wounds []  - 0 Large Wound Dressing one or multiple wounds []  - 0 Application of Medications - topical []  - 0 Application of Medications - injection INTERVENTIONS - Miscellaneous []  - 0 External ear exam []  - 0 Specimen Collection (cultures, biopsies, blood, body fluids, etc.) []  - 0 Specimen(s) / Culture(s) sent or taken to Lab for analysis []  - 0 Patient Transfer (multiple staff / Nurse, adultHoyer Lift / Similar devices) []  - 0 Simple Staple / Suture removal (25 or less) []  - 0 Complex Staple / Suture removal (26 or more) []  - 0 Hypo / Hyperglycemic Management (close monitor of Blood Glucose) []  - 0 Ankle / Brachial Index (ABI) - do not check if billed separately X- 1 5 Vital Signs Has the patient been seen at the hospital within the last three years: Yes Total Score: 80 Level Of Care: New/Established - Level 3 Electronic Signature(s) Signed: 02/04/2020 5:24:10 PM By: Benjamin [name], Benjamin [name], Benjamin [name] Entered By: Benjamin [name], Benjamin on 01/31/2020 12:44:58 -------------------------------------------------------------------------------- Encounter Discharge Information Details Patient Name: [place] of [place] 01/31/2020 10:30 A M Medical Record Number: [id number] Patient Account Number: [id number] Date of Birth/Sex: Treating [name]: 05/10/1960 (59 y.o. Benjamin [name]) [name], Benjamin Primary Care Coren Crownover: Benjamin FeltsMoore, Benjamin Other  Clinician: Referring Gavan Nordby: Treating Qiana Landgrebe/Extender: Pauline Goodobson, Benjamin [name], Benjamin [name] in Treatment: 4 Encounter Discharge Information Items Discharge Condition: Stable Ambulatory Status: Ambulatory Discharge Destination: Home Transportation: Private Auto Accompanied By: self Schedule Follow-up Appointment: Yes Clinical Summary of Care: Electronic Signature(s) Signed: 01/31/2020 2:12:11 PM By: Shawn Stalleaton, Benjamin Entered By: Shawn Stalleaton, Benjamin on 01/31/2020 12:22:38 -------------------------------------------------------------------------------- Lower Extremity Assessment Details Patient Name: [place] 01/31/2020 10:30 A M Medical Record Number: [id number] Patient Account Number: [id number] Date of Birth/Sex: Treating [name]: 08/24/1960 (59 y.o. Benjamin [place]) [name], Benjamin Primary Care Brylan Dec: Benjamin FeltsMoore, Benjamin Other Clinician: Referring Zariel Capano: Treating Cashae Weich/Extender: Anise Salvoobson, Benjamin [name], Benjamin [name] in Treatment: 4 Edema Assessment Assessed: Benjamin [name][Left: No] [Right: No] [Left: Edema] [Right: :] Calf Left: Right: Point of Measurement: 42 cm From Medial Instep 38.5 cm Ankle Left: Right: Point of Measurement: 8 cm From Medial Instep 24 cm Electronic Signature(s) Signed: 01/31/2020 2:11:24 PM By: Yevonne PaxEpps, Carrie [name] Entered By: Yevonne PaxEpps, Benjamin on 01/31/2020 10:50:26 -------------------------------------------------------------------------------- Multi Wound Chart Details Patient Name: Date of Service: [place]. 01/31/2020 10:30 A M Medical Record Number: [id number] Patient Account Number: [id number] Date of Birth/Sex: Treating [name]: 04/22/1960 (59 y.o. Benjamin [name]) [name], Benjamin Primary Care Alejo Beamer: Benjamin FeltsMoore, Benjamin Other Clinician: Referring Suvan Stcyr: Treating Sareen Randon/Extender: Pauline Goodobson, Benjamin [name], Benjamin [name] in Treatment: 4 Vital Signs Height(in): 66 Pulse(bpm): 87 Weight(lbs): 151 Blood Pressure(mmHg): 148/88 Body Mass Index(BMI): 24 Temperature(F):  98.4 Respiratory Rate(breaths/min): 18 Photos: [1:No Photos Left Foot] [N/A:N/A N/A] Wound Location: [1:Gradually Appeared] [N/A:N/A] Wounding Event: [1:Arterial Insufficiency Ulcer] [N/A:N/A] Primary Etiology: [1:Hypertension, Peripheral Arterial] [N/A:N/A] Comorbid History: [1:Disease 10/23/2019] [N/A:N/A] Date Acquired: [1:4] [N/A:N/A] [name] of Treatment: [1:Healed - Epithelialized] [N/A:N/A] Wound Status: [1:0x0x0] [N/A:N/A] Measurements L x W x D (cm) [1:0] [N/A:N/A] A (cm) :  rea [1:0] [N/A:N/A] Volume (cm) : [1:100.00%] [N/A:N/A] % Reduction in Area: [1:100.00%] [N/A:N/A] % Reduction in Volume: [1:Full Thickness With Exposed Support] [N/A:N/A] Classification: [1:Structures None Present] [N/A:N/A] Exudate Amount: [1:None Present (0%)] [N/A:N/A] Granulation Amount: [1:None Present (0%)] [N/A:N/A] Necrotic Amount: [1:Fascia: No] [N/A:N/A] Exposed Structures: [1:Fat Layer (Subcutaneous Tissue): No Tendon: No Muscle: No Joint: No Bone: No Large (67-100%)] [N/A:N/A] Treatment Notes Electronic Signature(s) Signed: 01/31/2020 5:15:15 PM By: Zenaida Deed [name], Benjamin [name] Signed: 02/06/2020 8:09:40 AM By: Baltazar Najjar MD Entered By: Baltazar Najjar on 01/31/2020 12:56:43 -------------------------------------------------------------------------------- Multi-Disciplinary Care Plan Details Patient Name: Date of Service: CO [address] [place], [place] V ID L. 01/31/2020 10:30 A M Medical Record Number: [id number] Patient Account Number: [id number] Date of Birth/Sex: Treating [name]: Dec 31, 1960 (59 y.o. Benjamin [name] Primary Care Jeremyah Jelley: Other Clinician: Arnette [name] Referring Yacob Wilkerson: Treating Makaila Windle/Extender: Pauline Good in Treatment: 4 Active Inactive Electronic Signature(s) Signed: 02/04/2020 5:24:10 PM By: Benjamin Abts [name], Benjamin [name] Entered By: Benjamin Abts on 01/31/2020 11:06:17 -------------------------------------------------------------------------------- Pain  Assessment Details Patient Name: Date of Service: [place] Benjamin [name], [place] V ID L. 01/31/2020 10:30 A M Medical Record Number: [id number] Patient Account Number: [id number] Date of Birth/Sex: Treating [name]: [birth year] (59 y.o. Benjamin [name] Primary Care Alesandra Smart: Benjamin [name] Other Clinician: Referring Shalaya Swailes: Treating Janeli Lewison/Extender: Pauline Good in Treatment: 4 Active Problems Location of Pain Severity and Description of Pain Patient Has Paino No Site Locations Pain Management and Medication Current Pain Management: Electronic Signature(s) Signed: 01/31/2020 2:11:24 PM By: Yevonne Pax [name] Entered By: Yevonne Pax on 01/31/2020 10:48:01 -------------------------------------------------------------------------------- Patient/Caregiver Education Details Patient Name: Date of Service: [place] Benjamin [name], [place] V ID L. 12/10/2021andnbsp10:30 A M Medical Record Number: [id number] Patient Account Number: [id number] Date of Birth/Gender: Treating [name]: [birth year] (59 y.o. Benjamin [name] Primary Care Physician: Benjamin [name] Other Clinician: Referring Physician: Treating Physician/Extender: Pauline Good in Treatment: 4 Education Assessment Education Provided To: Patient Education Topics Provided Smoking and Wound Healing: Methods: Explain/Verbal Responses: State content correctly Wound/Skin Impairment: Methods: Explain/Verbal Responses: State content correctly Electronic Signature(s) Signed: 02/04/2020 5:24:10 PM By: Benjamin Abts [name], Benjamin [name] Entered By: Benjamin Abts on 01/31/2020 11:06:38 -------------------------------------------------------------------------------- Wound Assessment Details Patient Name: Date of Service: [place] Benjamin [name], [place] L. 01/31/2020 10:30 A M Medical Record Number: [id number] Patient Account Number: [id number] Date of Birth/Sex: Treating [name]: 09/27/1960 (59 y.o. Benjamin [name]) Yevonne Pax Primary Care Sakari Raisanen: Benjamin [name] Other  Clinician: Referring Damiya Sandefur: Treating Uno Esau/Extender: Anise Salvo [name] in Treatment: 4 Wound Status Wound Number: 1 Primary Etiology: Arterial Insufficiency Ulcer Wound Location: Left Foot Wound Status: Healed - Epithelialized Wounding Event: Gradually Appeared Comorbid History: Hypertension, Peripheral Arterial Disease Date Acquired: 10/23/2019 [name] Of Treatment: 4 Clustered Wound: No Wound Measurements Length: (cm) Width: (cm) Depth: (cm) Area: (cm) Volume: (cm) 0 % Reduction in Area: 100% 0 % Reduction in Volume: 100% 0 Epithelialization: Large (67-100%) 0 Tunneling: No 0 Undermining: No Wound Description Classification: Full Thickness With Exposed Support Structures Exudate Amount: None Present Foul Odor After Cleansing: No Slough/Fibrino No Wound Bed Granulation Amount: None Present (0%) Exposed Structure Necrotic Amount: None Present (0%) Fascia Exposed: No Fat Layer (Subcutaneous Tissue) Exposed: No Tendon Exposed: No Muscle Exposed: No Joint Exposed: No Bone Exposed: No Electronic Signature(s) Signed: 01/31/2020 2:11:24 PM By: Yevonne Pax [name] Signed: 02/04/2020 5:24:10 PM By: Benjamin Abts [name], Benjamin [name] Entered By: Benjamin Abts on 01/31/2020 12:43:28 -------------------------------------------------------------------------------- Vitals Details Patient Name: Date of Service: [place] L.  01/31/2020 10:30 A M Medical Record Number: [id number] Patient Account Number: [id number] Date of Birth/Sex: Treating [name]: [birth year] (59 y.o. Benjamin [name]) Yevonne Pax Primary Care Mellonie Guess: Benjamin [name] Other Clinician: Referring Kwynn Schlotter: Treating Mardi Cannady/Extender: Pauline Good in Treatment: 4 Vital Signs Time Taken: 10:47 Temperature (F): 98.4 Height (in): 66 Pulse (bpm): 87 Weight (lbs): 151 Respiratory Rate (breaths/min): 18 Body Mass Index (BMI): 24.4 Blood Pressure (mmHg): 148/88 Reference Range: 80 - 120 mg  / dl Electronic Signature(s) Signed: 01/31/2020 2:11:24 PM By: Yevonne Pax [name] Entered By: Yevonne Pax on 01/31/2020 10:47:53

## 2020-02-06 NOTE — Progress Notes (Signed)
JACOBY, ZANNI ([id number]) Visit Report for 01/31/2020 HPI Details Patient Name: Date of Service: [place], [place] V ID L. 01/31/2020 10:30 A M Medical Record Number: [id number] Patient Account Number: [id number] Date of Birth/Sex: Treating RN: 23-Apr-1960 (59 y.o. Damaris Schooner Primary Care Provider: Arnette Felts Other Clinician: Referring Provider: Treating Provider/Extender: Pauline Good in Treatment: 4 History of Present Illness HPI Description: 01/03/2020 This is a 59 year old man with a past medical history of peripheral artery disease. He had a past history of undergoing bilateral common iliac artery stenting. He is not a diabetic but he is a half a pack per day smoker. He developed severe claudication and in September on 11/13/2019 he underwent a left to right femoral artery bypass graft, a left femoral to below-knee popliteal bypass with vein graft by Dr. Myra Gianotti. He required a rePete procedure on 9/27 with a left groin angioplasty of the left femoropopliteal vein graft. He has been followed in vein and vascular since then. He saw Dr. Myra Gianotti on 12/02/2019 noted a left great toe wound using Santyl. He arrives in clinic today with a small wound over the dorsal left foot just proximal to the left first metatarsal head. This had a black eschar on it which was removed by her intake nurse when washing his foot he has a nice clean wound underneath. The patient had postoperative arterial tests on 9/24 this showed an ABI of 0.69 on the right with a TBI in the right of 0.32 and monophasic waveforms. On the left his ABI was 0.7 monophasic but with a great toe pressure of 0. The patient weeks works at [employer]. He does have insurance. He states to our nurses that he is not really taking the majority of his medications including Plavix because of cost issues. 12/10; I admitted this patient to the clinic almost a month ago. The area was on the left dorsal foot. This is  totally closed today. He has been dressing this himself. He works at [employer]. He had already had a commando revascularization by Dr. Myra Gianotti him in September of this year Electronic Signature(s) Signed: 02/06/2020 8:09:40 AM By: Baltazar Najjar MD Entered By: Baltazar Najjar on 01/31/2020 12:59:14 -------------------------------------------------------------------------------- Physical Exam Details Patient Name: Date of Service: CO Iz[place], [place] V ID L. 01/31/2020 10:30 A M Medical Record Number: [id number] Patient Account Number: [id number] Date of Birth/Sex: Treating RN: 15-Mar-1960 (59 y.o. Damaris Schooner Primary Care Provider: Arnette Felts Other Clinician: Referring Provider: Treating Provider/Extender: Pauline Good in Treatment: 4 Constitutional Patient is hypertensive.. Pulse regular and within target range for patient.Marland Kitchen Respirations regular, non-labored and within target range.. Temperature is normal and within the target range for the patient.Marland Kitchen Appears in no distress. Cardiovascular Pulses are palpable but reduced. Notes Wound exam; the patient has totally healed the area on the dorsal foot just proximal to the first metatarsal head. Electronic Signature(s) Signed: 02/06/2020 8:09:40 AM By: Baltazar Najjar MD Entered By: Baltazar Najjar on 01/31/2020 12:58:17 -------------------------------------------------------------------------------- Physician Orders Details Patient Name: Date of Service: CO Iz[place], [place] V ID L. 01/31/2020 10:30 A M Medical Record Number: [id number] Patient Account Number: [id number] Date of Birth/Sex: Treating RN: Nov 13, 1960 (59 y.o. 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 01/31/2020 10:30 A M Medical Record Number: [id number] Patient Account Number: [id number] Date of Birth/Sex: Treating RN: June 04, 1960 (59 y.o. Elizebeth Koller Primary Care Provider: Arnette Felts Other Clinician: Referring Provider: Treating Provider/Extender: Pauline Good in Treatment: 4 Active Problems ICD-10 Encounter Code Description Active Date MDM Diagnosis I70.245 Atherosclerosis of native arteries of left leg with ulceration of other part of 01/03/2020 No Yes foot L97.522 Non-pressure chronic ulcer of other part of left foot with fat layer exposed 01/03/2020 No Yes Inactive Problems Resolved Problems Electronic Signature(s) Signed: 02/06/2020 8:09:40 AM By: Baltazar Najjar MD Entered By: Baltazar Najjar on 01/31/2020 12:56:36 -------------------------------------------------------------------------------- Progress Note Details Patient Name: Date of Service: CO [place], [place] V ID L. 01/31/2020 10:30 A M Medical Record Number: [id number] Patient Account Number: [id number] Date of Birth/Sex: Treating RN: 01-Jan-1961 (59 y.o. Damaris Schooner Primary Care Provider: Arnette Felts Other Clinician: Referring Provider: Treating Provider/Extender: Pauline Good in Treatment: 4 Subjective History of Present Illness (HPI) 01/03/2020 This is a  59 year old man with a past medical history of peripheral artery disease. He had a past history of undergoing bilateral common iliac artery stenting. He is not a diabetic but he is a half a pack per day smoker. He developed severe claudication and in September on 11/13/2019 he underwent a left to right femoral artery bypass graft, a left femoral to below-knee popliteal bypass with vein graft by Dr. Myra Gianotti. He required a rePete procedure on 9/27 with a left groin angioplasty of the left femoropopliteal vein graft. He has been followed in vein and vascular since then. He saw Dr. Myra Gianotti on 12/02/2019 noted a left great toe wound using Santyl. He arrives in clinic today with a small wound over the dorsal left foot just proximal to the left first metatarsal head. This had a black eschar on it which was removed by her intake nurse when washing his foot he has a nice clean wound underneath. The patient had postoperative arterial tests on 9/24 this showed an ABI of 0.69 on the right with a TBI in the right of 0.32 and monophasic waveforms. On the left his ABI was 0.7 monophasic but with a great toe pressure of 0. The patient weeks works at [employer]. He does have insurance. He states to our nurses that he is not really taking the majority of his medications including Plavix because of cost issues. 12/10; I admitted this patient to the clinic almost a month ago. The area was on the left dorsal foot. This is totally closed today. He has been dressing this himself. He works at [employer]. He had already had a commando revascularization by Dr. Myra Gianotti him in September of this year Objective Constitutional Patient is hypertensive.. Pulse regular and within target range for patient.Marland Kitchen Respirations regular, non-labored and within target range.. Temperature is normal and within the target range for the patient.Marland Kitchen Appears in no distress. Vitals Time Taken: 10:47 AM, Height: 66 in, Weight: 151 lbs, BMI: 24.4,  Temperature: 98.4 F, Pulse: 87 bpm, Respiratory Rate: 18 breaths/min, Blood Pressure:  148/88 mmHg. Cardiovascular Pulses are palpable but reduced. General Notes: Wound exam; the patient has totally healed the area on the dorsal foot just proximal to the first metatarsal head. Integumentary (Hair, Skin) Wound #1 status is Healed - Epithelialized. Original cause of wound was Gradually Appeared. The wound is located on the Left Foot. The wound measures 0cm length x 0cm width x 0cm depth; 0cm^2 area and 0cm^3 volume. There is no tunneling or undermining noted. There is a none present amount of drainage noted. There is no granulation within the wound bed. There is no necrotic tissue within the wound bed. 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 The patient can be discharged from the wound care center 2. Notable that he has soft but palpable pulses at the dorsalis pedis and posterior tibia truly remarkable 3. The patient is still smoking and again I talked to him about this Electronic Signature(s) Signed: 02/06/2020 8:09:40 AM By: Baltazar Najjar MD Entered By: Baltazar Najjar on 01/31/2020 12:59:51 -------------------------------------------------------------------------------- SuperBill Details Patient Name: Date of Service: [place] Iz[place], [place] V ID L. 01/31/2020 Medical Record Number: [id number] Patient Account Number: [id number] Date of Birth/Sex: Treating RN: 11-Sep-1960 (59 y.o. 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

## 2020-03-02 ENCOUNTER — Ambulatory Visit: Payer: Managed Care, Other (non HMO) | Admitting: Podiatry

## 2020-03-16 ENCOUNTER — Ambulatory Visit: Payer: Managed Care, Other (non HMO) | Admitting: Surgery

## 2020-03-16 ENCOUNTER — Encounter (HOSPITAL_COMMUNITY): Payer: Managed Care, Other (non HMO)

## 2020-04-21 ENCOUNTER — Other Ambulatory Visit: Payer: Self-pay

## 2020-04-21 ENCOUNTER — Ambulatory Visit (INDEPENDENT_AMBULATORY_CARE_PROVIDER_SITE_OTHER): Payer: Self-pay | Admitting: Nurse Practitioner

## 2020-04-21 ENCOUNTER — Encounter: Payer: Self-pay | Admitting: Nurse Practitioner

## 2020-04-21 VITALS — BP 132/80 | HR 91 | Temp 98.2°F | Ht 66.8 in | Wt 147.6 lb

## 2020-04-21 DIAGNOSIS — I10 Essential (primary) hypertension: Secondary | ICD-10-CM

## 2020-04-21 DIAGNOSIS — M79672 Pain in left foot: Secondary | ICD-10-CM

## 2020-04-21 DIAGNOSIS — I739 Peripheral vascular disease, unspecified: Secondary | ICD-10-CM

## 2020-04-21 DIAGNOSIS — Z1211 Encounter for screening for malignant neoplasm of colon: Secondary | ICD-10-CM

## 2020-04-21 DIAGNOSIS — Z720 Tobacco use: Secondary | ICD-10-CM

## 2020-04-21 MED ORDER — BUPROPION HCL ER (XL) 150 MG PO TB24: 150.0000 mg | ORAL_TABLET | ORAL | 1 refills | Status: DC

## 2020-04-21 MED ORDER — PREGABALIN 50 MG PO CAPS: 50.0000 mg | ORAL_CAPSULE | Freq: Two times a day (BID) | ORAL | 2 refills | Status: DC

## 2020-04-21 NOTE — Progress Notes (Signed)
I,Yamilka Roman [employer] as a Neurosurgeon for [employer], FNP.,have documented all relevant documentation on the behalf of Arnette Felts, FNP,as directed by  Arnette Felts, FNP while in the presence of Arnette Felts, FNP. This visit occurred during the SARS-CoV-2 public health emergency.  Safety protocols were in place, including screening questions prior to the visit, additional usage of staff PPE, and extensive cleaning of exam room while observing appropriate contact time as indicated for disinfecting solutions.  Subjective:     Patient ID: Benjamin [name] , male    DOB: 1961/02/15 , 60 y.o.   MRN: [id number]   Chief Complaint  Patient presents with  . Foot Pain    Patient stated he is having some pain in his left foot on Sunday. He stated he went to see his vein doctor back in [place].     HPI  Patient presents today for foot pain.   Foot Pain This is a new problem. The current episode started in the past 7 days (sunday night). The problem occurs intermittently. Pertinent negatives include no congestion. Nothing (when he lays down he feels a throbbing pain to his foot with numbness) aggravates the symptoms.     Past Medical History:  Diagnosis Date  . Back pain   . Heavy smoker   . Hyperlipidemia LDL goal <70   . Hypertension   . 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   Bilateral femoral artery disease     Family History  Problem Relation Age of Onset  . Arthritis Mother   . COPD Father   . Stroke Father   . Multiple sclerosis Sister      Current Outpatient Medications:  .  acetaminophen (TYLENOL) 500 MG tablet, Take 500 mg by mouth every 6 (six) hours as needed for mild pain or headache., Disp: , Rfl:  .  amLODipine (NORVASC) 5 MG tablet, TAKE 1 TABLET(5 MG) BY MOUTH DAILY, Disp: 90 tablet, Rfl: 0 .  APPLE CIDER VINEGAR PO, Take 1 tablet by mouth 2 (two) times a week., Disp: , Rfl:  .  atorvastatin (LIPITOR) 20 MG tablet, Take 20 mg by mouth daily., Disp: , Rfl:   .  cilostazol (PLETAL) 100 MG tablet, Take 1 tablet (100 mg total) by mouth 2 (two) times daily before a meal., Disp: 60 tablet, Rfl: 6 .  collagenase (SANTYL) ointment, Apply 1 application topically daily. Apply ointment the thickness of a nickel to the wound daily with a saline moistened gauze wrap and then dry gauze and gauze wrap, Disp: 15 g, Rfl: 0 .  Glucosamine 500 MG CAPS, Take 500 mg by mouth daily., Disp: , Rfl:  .  Magnesium 200 MG TABS, Take 1 tablet by mouth daily with evening meal (Patient taking differently: Take 200 mg by mouth every evening. Take 1 tablet by mouth daily with evening meal), Disp: 30 tablet, Rfl: 2 .  Multiple Vitamin (MULTIVITAMIN WITH MINERALS) TABS tablet, Take 1 tablet by mouth daily., Disp: , Rfl:  .  pregabalin (LYRICA) 50 MG capsule, Take 1 capsule (50 mg total) by mouth 2 (two) times daily., Disp: 60 capsule, Rfl: 2 .  traMADol (ULTRAM) 50 MG tablet, Take 1 tablet (50 mg total) by mouth every 6 (six) hours as needed., Disp: 10 tablet, Rfl: 0 .  buPROPion (WELLBUTRIN XL) 150 MG 24 hr tablet, Take 1 tablet (150 mg total) by mouth every morning., Disp: 90 tablet, Rfl: 1 .  Cholecalciferol (VITAMIN D) 50 MCG (2000 UT) tablet, Take 2,000 Units  by mouth daily. (Patient not taking: Reported on 04/21/2020), Disp: , Rfl:  .  clopidogrel (PLAVIX) 75 MG tablet, Take 1 tablet (75 mg total) by mouth daily. (Patient not taking: Reported on 04/21/2020), Disp: 30 tablet, Rfl: 11   Allergies  Allergen Reactions  . Aspirin Nausea And Vomiting  . Garlic Other (See Comments)    sneezing     Review of Systems  Constitutional: Negative.   HENT: Negative.  Negative for congestion.   Eyes: Negative.   Respiratory: Negative.   Cardiovascular: Negative.   Gastrointestinal: Negative.   Endocrine: Negative.   Genitourinary: Negative.   Skin: Negative.   Neurological: Negative.   Hematological: Negative.   Psychiatric/Behavioral: Negative.      Today's Vitals   04/21/20  1432  BP: 132/80  Pulse: 91  Temp: 98.2 F (36.8 C)  TempSrc: Oral  Weight: 147 lb 9.6 oz (67 kg)  Height: 5' 6.8" (1.697 m)  PainSc: 3   PainLoc: Foot   Body mass index is 23.26 kg/m.   Objective:  Physical Exam Constitutional:      General: He is in acute distress.     Appearance: Normal appearance. He is normal weight.  Cardiovascular:     Rate and Rhythm: Normal rate and regular rhythm.     Pulses: Normal pulses.     Heart sounds: Normal heart sounds.  Pulmonary:     Effort: Pulmonary effort is normal.  Abdominal:     General: Abdomen is flat. Bowel sounds are normal.     Palpations: Abdomen is soft.  Musculoskeletal:        General: Normal range of motion.     Cervical back: Normal range of motion and neck supple.  Skin:    General: Skin is warm and dry.     Capillary Refill: Capillary refill takes less than 2 seconds.  Neurological:     General: No focal deficit present.     Mental Status: He is alert and oriented to person, place, and time.  Psychiatric:        Mood and Affect: Mood normal.        Behavior: Behavior normal.        Thought Content: Thought content normal.        Judgment: Judgment normal.         Assessment And Plan:     1. Left foot pain  Will try him on lyrica the gabapentin has not been as effective.  He is to stop taking the gabapentin - pregabalin (LYRICA) 50 MG capsule; Take 1 capsule (50 mg total) by mouth 2 (two) times daily.  Dispense: 60 capsule; Refill: 2  2. Peripheral vascular disease (HCC)  Continue with follow up with vein and vascular, he reports he has an appt later this month - buPROPion (WELLBUTRIN XL) 150 MG 24 hr tablet; Take 1 tablet (150 mg total) by mouth every morning.  Dispense: 90 tablet; Refill: 1  3. Primary hypertension . B/P is fairly controlled.  . The importance of regular exercise and dietary modification was stressed to the patient.  - buPROPion (WELLBUTRIN XL) 150 MG 24 hr tablet; Take 1 tablet  (150 mg total) by mouth every morning.  Dispense: 90 tablet; Refill: 1  4. Tobacco abuse  He is to continue with buproprione to help with decreasing smoking  I have explained to him smoking may make his foot pain worse.  - buPROPion (WELLBUTRIN XL) 150 MG 24 hr tablet; Take 1 tablet (150 mg  total) by mouth every morning.  Dispense: 90 tablet; Refill: 1 - CT CHEST LUNG CA SCREEN LOW DOSE W/O CM; Future  5. Encounter for screening colonoscopy  According to USPTF Colorectal cancer Screening guidelines. Colonoscopy is recommended every 10 years, starting at age 62 years pending insurance coverage  Will refer to GI for colon cancer screening. - Ambulatory referral to Gastroenterology     Patient was given opportunity to ask questions. Patient verbalized understanding of the plan and was able to repeat key elements of the plan. All questions were answered to their satisfaction.  Arnette Felts, FNP   I, Arnette Felts, FNP, have reviewed all documentation for this visit. The documentation on 04/22/20 for the exam, diagnosis, procedures, and orders are all accurate and complete.   THE PATIENT IS ENCOURAGED TO PRACTICE SOCIAL DISTANCING DUE TO THE COVID-19 PANDEMIC.

## 2020-04-26 ENCOUNTER — Encounter: Payer: Self-pay | Admitting: Cardiovascular Disease

## 2020-04-26 NOTE — Progress Notes (Signed)
This encounter was created in error - please disregard.

## 2020-04-27 ENCOUNTER — Telehealth: Payer: Self-pay

## 2020-04-27 ENCOUNTER — Encounter: Payer: Self-pay | Admitting: Cardiovascular Disease

## 2020-04-27 NOTE — Telephone Encounter (Signed)
Spoke to pt's sister to give her pt's studies and office visit dates/times. Pt is currently without insurance and has stopped taking Plavix. Sister is going to call pharmacy to let them know he is without insurance and will call us back if she has further issues/concerns.

## 2020-04-27 NOTE — Telephone Encounter (Signed)
Pt called to schedule an appt. He is scheduled for this month. Returned his call; he has a Geophysicist/field seismologist that is full.

## 2020-05-01 ENCOUNTER — Ambulatory Visit: Payer: Managed Care, Other (non HMO) | Admitting: Podiatry

## 2020-05-01 ENCOUNTER — Other Ambulatory Visit: Payer: Self-pay

## 2020-05-01 DIAGNOSIS — I70229 Atherosclerosis of native arteries of extremities with rest pain, unspecified extremity: Secondary | ICD-10-CM

## 2020-05-01 DIAGNOSIS — I739 Peripheral vascular disease, unspecified: Secondary | ICD-10-CM

## 2020-05-04 ENCOUNTER — Other Ambulatory Visit: Payer: Self-pay | Admitting: *Deleted

## 2020-05-04 ENCOUNTER — Ambulatory Visit (INDEPENDENT_AMBULATORY_CARE_PROVIDER_SITE_OTHER): Payer: Self-pay | Admitting: Surgery

## 2020-05-04 ENCOUNTER — Ambulatory Visit (INDEPENDENT_AMBULATORY_CARE_PROVIDER_SITE_OTHER)
Admission: RE | Admit: 2020-05-04 | Discharge: 2020-05-04 | Disposition: A | Discharge status: Home / Self Care | Payer: Self-pay | Source: Ambulatory Visit | Attending: Surgery | Admitting: Surgery

## 2020-05-04 ENCOUNTER — Encounter: Payer: Self-pay | Admitting: Surgery

## 2020-05-04 ENCOUNTER — Ambulatory Visit (HOSPITAL_COMMUNITY)
Admission: RE | Admit: 2020-05-04 | Discharge: 2020-05-04 | Disposition: A | Discharge status: Home / Self Care | Payer: Self-pay | Source: Ambulatory Visit | Attending: Surgery | Admitting: Surgery

## 2020-05-04 ENCOUNTER — Other Ambulatory Visit: Payer: Self-pay

## 2020-05-04 VITALS — BP 160/108 | HR 99 | Temp 98.6°F | Wt 146.4 lb

## 2020-05-04 DIAGNOSIS — I739 Peripheral vascular disease, unspecified: Secondary | ICD-10-CM

## 2020-05-04 DIAGNOSIS — I7025 Atherosclerosis of native arteries of other extremities with ulceration: Secondary | ICD-10-CM

## 2020-05-04 DIAGNOSIS — I70229 Atherosclerosis of native arteries of extremities with rest pain, unspecified extremity: Secondary | ICD-10-CM | POA: Insufficient documentation

## 2020-05-04 NOTE — Progress Notes (Signed)
 [facility] [place]  Patient name: Benjamin [name] MRN: [id number] DOB: 12/20/1960 Sex: male   REASON FOR VISIT:    follow up  HISOTRY OF PRESENT ILLNESS:   Benjamin [name] is a 60 y.o. male with severe PAD, who I met in 2018.  He was having 1 block claudication.  He also was waking up at night with pain in his left leg which was improved by hanging it over the bed. 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  There is diffuse disease throughout the right superficial femoral and popliteal artery with occlusion of the distal popliteal artery and reconstitution of the posterior tibial artery.  He ultimately decided to not pursue surgical revascularization at that time.  They returned in 2021 with a left toe ulcer.  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  He underwent stenting of a right common and external iliac stenosis.  Then on 11/13/2019 he had a right to left femoral-femoral bypass graft followed by left femoral to below-knee popliteal artery bypass graft with saphenous vein.  He was taken back to the operating room on 923 due to a bypass graft stenosis which was treated with balloon angioplasty.  The patient returns today for surveillance ultrasound imaging which showed that the femoral-femoral graft and the femoral-popliteal graft are occluded.  He is having symptoms of severe claudication/rest pain and he has a new wound on the lateral aspect of his foot.  The original ulcer on the top of his great toe has healed.    The patient has a long-term history of smoking.  He takes a statin for hypercholesterolemia.  He is allergic to aspirin.  He states that he has not been taking his Plavix or cilostazol.  PAST MEDICAL HISTORY:   Past Medical History:  Diagnosis Date  . Back pain   .  Heavy smoker   . Hyperlipidemia LDL goal <70   . Hypertension   . 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   Bilateral femoral artery disease     FAMILY HISTORY:   Family History  Problem Relation Age of Onset  . Arthritis Mother   . COPD Father   . Stroke Father   . Multiple sclerosis Sister     SOCIAL HISTORY:   Social History   Tobacco Use  . Smoking status: Current Every Day Smoker    Packs/day: 1.50    Years: 29.00    Pack years: 43.50    Types: Cigarettes  . Smokeless tobacco: Never Used  Substance Use Topics  . Alcohol use: No     ALLERGIES:   Allergies  Allergen Reactions  . Aspirin Nausea And Vomiting  . Garlic Other (See Comments)    sneezing     CURRENT MEDICATIONS:   Current Outpatient Medications  Medication Sig Dispense Refill  . acetaminophen (TYLENOL) 500 MG tablet Take 500 mg by mouth every 6 (six) hours as needed for mild pain or headache.    . amLODipine (NORVASC) 5 MG tablet TAKE 1 TABLET(5 MG) BY MOUTH DAILY 90 tablet 0  . APPLE CIDER VINEGAR PO Take 1 tablet by mouth 2 (two) times a week.    . atorvastatin (LIPITOR) 20 MG tablet Take 20 mg by mouth daily.    . buPROPion (WELLBUTRIN XL) 150 MG 24 hr tablet Take 1 tablet (150 mg total) by mouth every morning.   90 tablet 1  . Cholecalciferol (VITAMIN D) 50 MCG (2000 UT) tablet Take 2,000 Units by mouth daily.    . cilostazol (PLETAL) 100 MG tablet Take 1 tablet (100 mg total) by mouth 2 (two) times daily before a meal. 60 tablet 6  . clopidogrel (PLAVIX) 75 MG tablet Take 1 tablet (75 mg total) by mouth daily. 30 tablet 11  . collagenase (SANTYL) ointment Apply 1 application topically daily. Apply ointment the thickness of a nickel to the wound daily with a saline moistened gauze wrap and then dry gauze and gauze wrap 15 g 0  . Glucosamine 500 MG CAPS Take 500 mg by mouth daily.    . Magnesium 200 MG TABS Take 1 tablet by mouth daily with evening meal (Patient taking differently:  Take 200 mg by mouth every evening. Take 1 tablet by mouth daily with evening meal) 30 tablet 2  . Multiple Vitamin (MULTIVITAMIN WITH MINERALS) TABS tablet Take 1 tablet by mouth daily.    . pregabalin (LYRICA) 50 MG capsule Take 1 capsule (50 mg total) by mouth 2 (two) times daily. 60 capsule 2  . traMADol (ULTRAM) 50 MG tablet Take 1 tablet (50 mg total) by mouth every 6 (six) hours as needed. 10 tablet 0   No current facility-administered medications for this visit.    REVIEW OF SYSTEMS:   [X] denotes positive finding, [ ] denotes negative finding Cardiac  Comments:  Chest pain or chest pressure:    Shortness of breath upon exertion:    Short of breath when lying flat:    Irregular heart rhythm:        Vascular    Pain in calf, thigh, or hip brought on by ambulation: x   Pain in feet at night that wakes you up from your sleep:  x   Blood clot in your veins:    Leg swelling:         Pulmonary    Oxygen at home:    Productive cough:     Wheezing:         Neurologic    Sudden weakness in arms or legs:     Sudden numbness in arms or legs:     Sudden onset of difficulty speaking or slurred speech:    Temporary loss of vision in one eye:     Problems with dizziness:         Gastrointestinal    Blood in stool:     Vomited blood:         Genitourinary    Burning when urinating:     Blood in urine:        Psychiatric    Major depression:         Hematologic    Bleeding problems:    Problems with blood clotting too easily:        Skin    Rashes or ulcers: x       Constitutional    Fever or chills:      PHYSICAL EXAM:   Vitals:   05/04/20 1512  BP: (!) 160/108  Pulse: 99  Temp: 98.6 F (37 C)  TempSrc: Skin  SpO2: 96%  Weight: 146 lb 6.4 oz (66.4 kg)    GENERAL: The patient is a well-nourished male, in no acute distress. The vital signs are documented above. CARDIAC: There is a regular rate and rhythm.  VASCULAR: Femoral pulses not palpable on the  left.  He does not have a pedal   pulse on the left. PULMONARY: Non-labored respirations ABDOMEN: Soft and non-tender MUSCULOSKELETAL: There are no major deformities or cyanosis. NEUROLOGIC: No focal weakness or paresthesias are detected. SKIN: The original ulcer on the dorsum of his left great toe has healed.  He has a new lateral left foot wound measuring about 3 mm. PSYCHIATRIC: The patient has a normal affect.  STUDIES:   I have reviewed his ultrasound from today which shows an occluded femoral-femoral bypass graft and an occluded femoral-popliteal bypass graft. Right ABI 0.79.  Left ABI 0  MEDICAL ISSUES:   Left leg ulcer:  The patient's previous attempt at revascularization has occluded and he has a new ulcer on his left foot.  This is again a limb threatening situation which will require revascularization.  We discussed proceeding with an aortobifemoral bypass graft and attempt at thrombectomy of his femoral-popliteal bypass, or a redo bypass.  Alternatively, he potentially could have a femoral endarterectomy and retrograde recanalization of his iliac artery.  I am going to get a CT scan to better define his anatomy.  He is scheduled for surgery this Friday.  I discussed the details of the procedure as well as the risks and benefits.  All questions were answered.    Wells Ellagrace Yoshida, IV, MD, FACS [facility] [place] Tel [phone] Pager [phone] 

## 2020-05-04 NOTE — H&P (View-Only) (Signed)
Vascular and Vein Specialist of [facility]  Patient name: HELIODORO [name] MRN: [id number] DOB: 04/29/1960 Sex: male   REASON FOR VISIT:    follow up  HISOTRY OF PRESENT ILLNESS:   Benjamin [name] is a 60 y.o. male with severe PAD, who I met in 2018.  He was having 1 block claudication.  He also was waking up at night with pain in his left leg which was improved by hanging it over the bed. 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  There is diffuse disease throughout the right superficial femoral and popliteal artery with occlusion of the distal popliteal artery and reconstitution of the posterior tibial artery.  He ultimately decided to not pursue surgical revascularization at that time.  They returned in 2021 with a left toe ulcer.  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  He underwent stenting of a right common and external iliac stenosis.  Then on 11/13/2019 he had a right to left femoral-femoral bypass graft followed by left femoral to below-knee popliteal artery bypass graft with saphenous vein.  He was taken back to the operating room on 923 due to a bypass graft stenosis which was treated with balloon angioplasty.  The patient returns today for surveillance ultrasound imaging which showed that the femoral-femoral graft and the femoral-popliteal graft are occluded.  He is having symptoms of severe claudication/rest pain and he has a new wound on the lateral aspect of his foot.  The original ulcer on the top of his great toe has healed.    The patient has a long-term history of smoking.  He takes a statin for hypercholesterolemia.  He is allergic to aspirin.  He states that he has not been taking his Plavix or cilostazol.  PAST MEDICAL HISTORY:   Past Medical History:  Diagnosis Date  . Back pain   .  Heavy smoker   . Hyperlipidemia LDL goal <70   . Hypertension   . Peripheral arterial occlusive disease ([place]) 06/2013   Bilateral femoral artery disease     FAMILY HISTORY:   Family History  Problem Relation Age of Onset  . Arthritis Mother   . COPD Father   . Stroke Father   . Multiple sclerosis Sister     SOCIAL HISTORY:   Social History   Tobacco Use  . Smoking status: Current Every Day Smoker    Packs/day: 1.50    Years: 29.00    Pack years: 43.50    Types: Cigarettes  . Smokeless tobacco: Never Used  Substance Use Topics  . Alcohol use: No     ALLERGIES:   Allergies  Allergen Reactions  . Aspirin Nausea And Vomiting  . Garlic Other (See Comments)    sneezing     CURRENT MEDICATIONS:   Current Outpatient Medications  Medication Sig Dispense Refill  . acetaminophen (TYLENOL) 500 MG tablet Take 500 mg by mouth every 6 (six) hours as needed for mild pain or headache.    Marland Kitchen amLODipine (NORVASC) 5 MG tablet TAKE 1 TABLET(5 MG) BY MOUTH DAILY 90 tablet 0  . APPLE CIDER VINEGAR PO Take 1 tablet by mouth 2 (two) times a week.    Marland Kitchen atorvastatin (LIPITOR) 20 MG tablet Take 20 mg by mouth daily.    Marland Kitchen buPROPion (WELLBUTRIN XL) 150 MG 24 hr tablet Take 1 tablet (150 mg total) by mouth every morning.  90 tablet 1  . Cholecalciferol (VITAMIN D) 50 MCG (2000 UT) tablet Take 2,000 Units by mouth daily.    . cilostazol (PLETAL) 100 MG tablet Take 1 tablet (100 mg total) by mouth 2 (two) times daily before a meal. 60 tablet 6  . clopidogrel (PLAVIX) 75 MG tablet Take 1 tablet (75 mg total) by mouth daily. 30 tablet 11  . collagenase (SANTYL) ointment Apply 1 application topically daily. Apply ointment the thickness of a nickel to the wound daily with a saline moistened gauze wrap and then dry gauze and gauze wrap 15 g 0  . Glucosamine 500 MG CAPS Take 500 mg by mouth daily.    . Magnesium 200 MG TABS Take 1 tablet by mouth daily with evening meal (Patient taking differently:  Take 200 mg by mouth every evening. Take 1 tablet by mouth daily with evening meal) 30 tablet 2  . Multiple Vitamin (MULTIVITAMIN WITH MINERALS) TABS tablet Take 1 tablet by mouth daily.    . pregabalin (LYRICA) 50 MG capsule Take 1 capsule (50 mg total) by mouth 2 (two) times daily. 60 capsule 2  . traMADol (ULTRAM) 50 MG tablet Take 1 tablet (50 mg total) by mouth every 6 (six) hours as needed. 10 tablet 0   No current facility-administered medications for this visit.    REVIEW OF SYSTEMS:   [X] denotes positive finding, [ ] denotes negative finding Cardiac  Comments:  Chest pain or chest pressure:    Shortness of breath upon exertion:    Short of breath when lying flat:    Irregular heart rhythm:        Vascular    Pain in calf, thigh, or hip brought on by ambulation: x   Pain in feet at night that wakes you up from your sleep:  x   Blood clot in your veins:    Leg swelling:         Pulmonary    Oxygen at home:    Productive cough:     Wheezing:         Neurologic    Sudden weakness in arms or legs:     Sudden numbness in arms or legs:     Sudden onset of difficulty speaking or slurred speech:    Temporary loss of vision in one eye:     Problems with dizziness:         Gastrointestinal    Blood in stool:     Vomited blood:         Genitourinary    Burning when urinating:     Blood in urine:        Psychiatric    Major depression:         Hematologic    Bleeding problems:    Problems with blood clotting too easily:        Skin    Rashes or ulcers: x       Constitutional    Fever or chills:      PHYSICAL EXAM:   Vitals:   05/04/20 1512  BP: (!) 160/108  Pulse: 99  Temp: 98.6 F (37 C)  TempSrc: Skin  SpO2: 96%  Weight: 146 lb 6.4 oz (66.4 kg)    GENERAL: The patient is a well-nourished male, in no acute distress. The vital signs are documented above. CARDIAC: There is a regular rate and rhythm.  VASCULAR: Femoral pulses not palpable on the  left.  He does not have a pedal  pulse on the left. PULMONARY: Non-labored respirations ABDOMEN: Soft and non-tender MUSCULOSKELETAL: There are no major deformities or cyanosis. NEUROLOGIC: No focal weakness or paresthesias are detected. SKIN: The original ulcer on the dorsum of his left great toe has healed.  He has a new lateral left foot wound measuring about 3 mm. PSYCHIATRIC: The patient has a normal affect.  STUDIES:   I have reviewed his ultrasound from today which shows an occluded femoral-femoral bypass graft and an occluded femoral-popliteal bypass graft. Right ABI 0.79.  Left ABI 0  MEDICAL ISSUES:   Left leg ulcer:  The patient's previous attempt at revascularization has occluded and he has a new ulcer on his left foot.  This is again a limb threatening situation which will require revascularization.  We discussed proceeding with an aortobifemoral bypass graft and attempt at thrombectomy of his femoral-popliteal bypass, or a redo bypass.  Alternatively, he potentially could have a femoral endarterectomy and retrograde recanalization of his iliac artery.  I am going to get a CT scan to better define his anatomy.  He is scheduled for surgery this Friday.  I discussed the details of the procedure as well as the risks and benefits.  All questions were answered.    Leia Alf, MD, FACS [facility] [phone] Pager [phone]

## 2020-05-05 ENCOUNTER — Telehealth: Payer: Self-pay

## 2020-05-05 ENCOUNTER — Other Ambulatory Visit: Payer: Self-pay

## 2020-05-05 DIAGNOSIS — I739 Peripheral vascular disease, unspecified: Secondary | ICD-10-CM

## 2020-05-05 DIAGNOSIS — I7025 Atherosclerosis of native arteries of other extremities with ulceration: Secondary | ICD-10-CM

## 2020-05-05 NOTE — Telephone Encounter (Signed)
Pt called and left a message saying he no longer has insurance with his job and is concerned because he still needs surgery.  I called back and spoke to his sister [name].  I gave her the phone number for [facility] for further direction.  [name] verbalized understanding and will relay the message to the patient.   Ernst Spell., LPN

## 2020-05-06 ENCOUNTER — Ambulatory Visit
Admission: RE | Admit: 2020-05-06 | Discharge: 2020-05-06 | Disposition: A | Discharge status: Home / Self Care | Payer: Self-pay | Source: Ambulatory Visit | Attending: Surgery | Admitting: Surgery

## 2020-05-06 DIAGNOSIS — I739 Peripheral vascular disease, unspecified: Secondary | ICD-10-CM

## 2020-05-06 DIAGNOSIS — I7025 Atherosclerosis of native arteries of other extremities with ulceration: Secondary | ICD-10-CM

## 2020-05-06 MED ORDER — IOPAMIDOL (ISOVUE-370) INJECTION 76%
125.0000 mL | Freq: Once | INTRAVENOUS | Status: AC | PRN
  Administered 2020-05-06: 125 mL via INTRAVENOUS

## 2020-05-07 ENCOUNTER — Other Ambulatory Visit (HOSPITAL_COMMUNITY)
Admission: RE | Admit: 2020-05-07 | Discharge: 2020-05-07 | Disposition: A | Discharge status: Home / Self Care | Payer: Self-pay | Source: Ambulatory Visit | Attending: Surgery | Admitting: Surgery

## 2020-05-07 ENCOUNTER — Encounter (HOSPITAL_COMMUNITY): Payer: Self-pay | Admitting: Surgery

## 2020-05-07 ENCOUNTER — Other Ambulatory Visit (HOSPITAL_COMMUNITY): Payer: Self-pay

## 2020-05-07 DIAGNOSIS — Z20822 Contact with and (suspected) exposure to covid-19: Secondary | ICD-10-CM | POA: Insufficient documentation

## 2020-05-07 DIAGNOSIS — Z01812 Encounter for preprocedural laboratory examination: Secondary | ICD-10-CM | POA: Insufficient documentation

## 2020-05-07 LAB — SARS CORONAVIRUS 2 (TAT 6-24 HRS): SARS Coronavirus 2: NEGATIVE

## 2020-05-07 NOTE — Anesthesia Preprocedure Evaluation (Addendum)
Anesthesia Evaluation  Patient identified by MRN, date of birth, ID band Patient awake    Reviewed: Allergy & Precautions, H&P , NPO status , Patient's Chart, lab work & pertinent test results, Unable to perform ROS - Chart review only  History of Anesthesia Complications Negative for: history of anesthetic complications  Airway Mallampati: I  TM Distance: >3 FB Neck ROM: Full    Dental  (+) Edentulous Upper, Edentulous Lower, Dental Advisory Given   Pulmonary Current Smoker,    Pulmonary exam normal breath sounds clear to auscultation       Cardiovascular hypertension, Pt. on medications + Peripheral Vascular Disease   Rhythm:Regular Rate:Normal  9/21 Study Highlights    Nuclear stress EF: 45%. Visually appears 50%.  There was no ST segment deviation noted during stress.  This is a low risk study.   Mildly reduced EF which is known from prior echocardiogram.  No ischemia or infarction on perfusion images.    Neuro/Psych negative neurological ROS  negative psych ROS   GI/Hepatic negative GI ROS, Neg liver ROS,   Endo/Other  negative endocrine ROS  Renal/GU negative Renal ROS  negative genitourinary   Musculoskeletal   Abdominal   Peds  Hematology negative hematology ROS (+)   Anesthesia Other Findings   Reproductive/Obstetrics negative OB ROS                            Anesthesia Physical  Anesthesia Plan  ASA: III  Anesthesia Plan: General   Post-op Pain Management:    Induction: Intravenous  PONV Risk Score and Plan: 3 and Ondansetron, Dexamethasone and Midazolam  Airway Management Planned: Oral ETT  Additional Equipment: Arterial line and CVP  Intra-op Plan:   Post-operative Plan: Extubation in OR  Informed Consent: I have reviewed the patients History and Physical, chart, labs and discussed the procedure including the risks, benefits and alternatives for the  proposed anesthesia with the patient or authorized representative who has indicated his/her understanding and acceptance.     Dental advisory given  Plan Discussed with: Anesthesiologist and CRNA  Anesthesia Plan Comments: ( )       Anesthesia Quick Evaluation

## 2020-05-07 NOTE — Progress Notes (Signed)
EKG:05/15/19 CXR: 04/28/17 ECHO: 07/27/16 Stress Test: 11/06/19 Cardiac Cath: denies  Fasting Blood Sugar- na Checks Blood Sugar__na_ times a day  OSA/CPAP:  No  ASA/Blood Thinners:  No  Covid test 05/08/19 pending   Anesthesia Review: No  Patient denies shortness of breath, fever, cough, and chest pain at PAT appointment.  Patient verbalized understanding of instructions provided today at the PAT appointment.  Patient asked to review instructions at home and day of surgery.

## 2020-05-08 ENCOUNTER — Inpatient Hospital Stay (HOSPITAL_COMMUNITY)
Admission: RE | Admit: 2020-05-08 | Discharge: 2020-05-16 | DRG: 269 | Disposition: A | Discharge status: Home Health | Payer: Medicaid Other | Attending: Surgery | Admitting: Surgery

## 2020-05-08 ENCOUNTER — Inpatient Hospital Stay (HOSPITAL_COMMUNITY): Payer: Medicaid Other | Admitting: Certified Registered Nurse Anesthetist

## 2020-05-08 ENCOUNTER — Inpatient Hospital Stay (HOSPITAL_COMMUNITY): Payer: Medicaid Other

## 2020-05-08 ENCOUNTER — Encounter (HOSPITAL_COMMUNITY)
Admission: RE | Disposition: A | Discharge status: Home Health | Payer: Self-pay | Source: Home / Self Care | Attending: Surgery

## 2020-05-08 ENCOUNTER — Other Ambulatory Visit: Payer: Self-pay

## 2020-05-08 ENCOUNTER — Encounter (HOSPITAL_COMMUNITY): Payer: Self-pay | Admitting: Surgery

## 2020-05-08 DIAGNOSIS — Z23 Encounter for immunization: Secondary | ICD-10-CM

## 2020-05-08 DIAGNOSIS — I7025 Atherosclerosis of native arteries of other extremities with ulceration: Principal | ICD-10-CM | POA: Diagnosis present

## 2020-05-08 DIAGNOSIS — Z9889 Other specified postprocedural states: Secondary | ICD-10-CM

## 2020-05-08 DIAGNOSIS — I739 Peripheral vascular disease, unspecified: Secondary | ICD-10-CM

## 2020-05-08 DIAGNOSIS — I70245 Atherosclerosis of native arteries of left leg with ulceration of other part of foot: Secondary | ICD-10-CM

## 2020-05-08 DIAGNOSIS — E7800 Pure hypercholesterolemia, unspecified: Secondary | ICD-10-CM | POA: Diagnosis present

## 2020-05-08 DIAGNOSIS — T82858A Stenosis of vascular prosthetic devices, implants and grafts, initial encounter: Secondary | ICD-10-CM | POA: Diagnosis present

## 2020-05-08 DIAGNOSIS — F17210 Nicotine dependence, cigarettes, uncomplicated: Secondary | ICD-10-CM | POA: Diagnosis present

## 2020-05-08 DIAGNOSIS — Z720 Tobacco use: Secondary | ICD-10-CM

## 2020-05-08 DIAGNOSIS — L98499 Non-pressure chronic ulcer of skin of other sites with unspecified severity: Secondary | ICD-10-CM | POA: Diagnosis present

## 2020-05-08 DIAGNOSIS — I10 Essential (primary) hypertension: Secondary | ICD-10-CM | POA: Diagnosis present

## 2020-05-08 DIAGNOSIS — E785 Hyperlipidemia, unspecified: Secondary | ICD-10-CM | POA: Diagnosis present

## 2020-05-08 DIAGNOSIS — Y828 Other medical devices associated with adverse incidents: Secondary | ICD-10-CM | POA: Diagnosis present

## 2020-05-08 DIAGNOSIS — I700 Atherosclerosis of aorta: Secondary | ICD-10-CM

## 2020-05-08 HISTORY — PX: FEMORAL-POPLITEAL BYPASS GRAFT: SHX937

## 2020-05-08 HISTORY — PX: AORTA - BILATERAL FEMORAL ARTERY BYPASS GRAFT: SHX1175

## 2020-05-08 LAB — COMPREHENSIVE METABOLIC PANEL
ALT: 21 U/L (ref 0–44)
AST: 24 U/L (ref 15–41)
Albumin: 3.9 g/dL (ref 3.5–5.0)
Alkaline Phosphatase: 115 U/L (ref 38–126)
Anion gap: 9 (ref 5–15)
BUN: 16 mg/dL (ref 8–23)
CO2: 25 mmol/L (ref 22–32)
Calcium: 9.2 mg/dL (ref 8.9–10.3)
Chloride: 102 mmol/L (ref 98–111)
Creatinine, Ser: 1.02 mg/dL (ref 0.61–1.24)
GFR, Estimated: >60 mL/min (ref >60)
Glucose, Bld: 101 mg/dL — ABNORMAL HIGH (ref 70–99)
Potassium: 3.2 mmol/L — ABNORMAL LOW (ref 3.5–5.1)
Sodium: 136 mmol/L (ref 135–145)
Total Bilirubin: 0.7 mg/dL (ref 0.3–1.2)
Total Protein: 7.1 g/dL (ref 6.5–8.1)

## 2020-05-08 LAB — POCT I-STAT 7, (LYTES, BLD GAS, ICA,H+H)
Acid-base deficit: 1 mmol/L (ref 0.0–2.0)
Acid-base deficit: 4 mmol/L — ABNORMAL HIGH (ref 0.0–2.0)
Bicarbonate: 26.5 mmol/L (ref 20.0–28.0)
Bicarbonate: 22.6 mmol/L (ref 20.0–28.0)
Calcium, Ion: 1.29 mmol/L (ref 1.15–1.40)
Calcium, Ion: 1.22 mmol/L (ref 1.15–1.40)
HCT: 36 % — ABNORMAL LOW (ref 39.0–52.0)
HCT: 32 % — ABNORMAL LOW (ref 39.0–52.0)
Hemoglobin: 12.2 g/dL — ABNORMAL LOW (ref 13.0–17.0)
Hemoglobin: 10.9 g/dL — ABNORMAL LOW (ref 13.0–17.0)
O2 Saturation: 100 %
O2 Saturation: 96 %
Patient temperature: 35.4
Patient temperature: 35.3
Potassium: 3.7 mmol/L (ref 3.5–5.1)
Potassium: 4.7 mmol/L (ref 3.5–5.1)
Sodium: 137 mmol/L (ref 135–145)
Sodium: 138 mmol/L (ref 135–145)
TCO2: 28 mmol/L (ref 22–32)
TCO2: 24 mmol/L (ref 22–32)
pCO2 arterial: 54.3 mmHg — ABNORMAL HIGH (ref 32.0–48.0)
pCO2 arterial: 43.3 mmHg (ref 32.0–48.0)
pH, Arterial: 7.288 — ABNORMAL LOW (ref 7.350–7.450)
pH, Arterial: 7.317 — ABNORMAL LOW (ref 7.350–7.450)
pO2, Arterial: 289 mmHg — ABNORMAL HIGH (ref 83.0–108.0)
pO2, Arterial: 79 mmHg — ABNORMAL LOW (ref 83.0–108.0)

## 2020-05-08 LAB — URINALYSIS, ROUTINE W REFLEX MICROSCOPIC
Bilirubin Urine: NEGATIVE
Glucose, UA: NEGATIVE mg/dL
Hgb urine dipstick: NEGATIVE
Ketones, ur: NEGATIVE mg/dL
Leukocytes,Ua: NEGATIVE
Nitrite: NEGATIVE
Protein, ur: NEGATIVE mg/dL
Specific Gravity, Urine: 1.026 (ref 1.005–1.030)
pH: 5 (ref 5.0–8.0)

## 2020-05-08 LAB — CBC
HCT: 41.4 % (ref 39.0–52.0)
HCT: 34.4 % — ABNORMAL LOW (ref 39.0–52.0)
Hemoglobin: 13.9 g/dL (ref 13.0–17.0)
Hemoglobin: 11.6 g/dL — ABNORMAL LOW (ref 13.0–17.0)
MCH: 26 pg (ref 26.0–34.0)
MCH: 26.3 pg (ref 26.0–34.0)
MCHC: 33.6 g/dL (ref 30.0–36.0)
MCHC: 33.7 g/dL (ref 30.0–36.0)
MCV: 77.4 fL — ABNORMAL LOW (ref 80.0–100.0)
MCV: 78 fL — ABNORMAL LOW (ref 80.0–100.0)
Platelets: 422 10*3/uL — ABNORMAL HIGH (ref 150–400)
Platelets: 344 10*3/uL (ref 150–400)
RBC: 5.35 MIL/uL (ref 4.22–5.81)
RBC: 4.41 MIL/uL (ref 4.22–5.81)
RDW: 18.3 % — ABNORMAL HIGH (ref 11.5–15.5)
RDW: 18.2 % — ABNORMAL HIGH (ref 11.5–15.5)
WBC: 7.5 10*3/uL (ref 4.0–10.5)
WBC: 15.7 10*3/uL — ABNORMAL HIGH (ref 4.0–10.5)
nRBC: 0 % (ref 0.0–0.2)
nRBC: 0 % (ref 0.0–0.2)

## 2020-05-08 LAB — MAGNESIUM: Magnesium: 1.7 mg/dL (ref 1.7–2.4)

## 2020-05-08 LAB — POCT ACTIVATED CLOTTING TIME
Activated Clotting Time: 255 seconds
Activated Clotting Time: 202 seconds
Activated Clotting Time: 208 seconds

## 2020-05-08 LAB — BASIC METABOLIC PANEL
Anion gap: 8 (ref 5–15)
BUN: 17 mg/dL (ref 8–23)
CO2: 21 mmol/L — ABNORMAL LOW (ref 22–32)
Calcium: 8.5 mg/dL — ABNORMAL LOW (ref 8.9–10.3)
Chloride: 107 mmol/L (ref 98–111)
Creatinine, Ser: 0.9 mg/dL (ref 0.61–1.24)
GFR, Estimated: >60 mL/min (ref >60)
Glucose, Bld: 163 mg/dL — ABNORMAL HIGH (ref 70–99)
Potassium: 4.4 mmol/L (ref 3.5–5.1)
Sodium: 136 mmol/L (ref 135–145)

## 2020-05-08 LAB — SURGICAL PCR SCREEN
MRSA, PCR: NEGATIVE
Staphylococcus aureus: NEGATIVE

## 2020-05-08 LAB — PROTIME-INR
INR: 1.1 (ref 0.8–1.2)
INR: 1.2 (ref 0.8–1.2)
Prothrombin Time: 13.3 seconds (ref 11.4–15.2)
Prothrombin Time: 14.9 seconds (ref 11.4–15.2)

## 2020-05-08 LAB — APTT
aPTT: 33 seconds (ref 24–36)
aPTT: 29 seconds (ref 24–36)

## 2020-05-08 LAB — PREPARE RBC (CROSSMATCH)

## 2020-05-08 SURGERY — BYPASS GRAFT FEMORAL-POPLITEAL ARTERY
Anesthesia: General | Site: Leg Lower

## 2020-05-08 MED ORDER — SODIUM CHLORIDE 0.9 % IV SOLN
INTRAVENOUS | Status: DC | PRN
  Administered 2020-05-08: 500 mL

## 2020-05-08 MED ORDER — PANTOPRAZOLE SODIUM 40 MG PO PACK: 40.0000 mg | PACK | Freq: Every day | ORAL | Status: DC

## 2020-05-08 MED ORDER — CLEVIDIPINE BUTYRATE 0.5 MG/ML IV EMUL
1.0000 mg/h | INTRAVENOUS | Status: DC
  Filled 2020-05-08: qty 50

## 2020-05-08 MED ORDER — ROCURONIUM BROMIDE 10 MG/ML (PF) SYRINGE
PREFILLED_SYRINGE | INTRAVENOUS | Status: AC
  Filled 2020-05-08: qty 10

## 2020-05-08 MED ORDER — ALUM & MAG HYDROXIDE-SIMETH 200-200-20 MG/5ML PO SUSP: 15.0000 mL | ORAL | Status: DC | PRN

## 2020-05-08 MED ORDER — ONDANSETRON HCL 4 MG/2ML IJ SOLN
INTRAMUSCULAR | Status: DC | PRN
  Administered 2020-05-08: 4 mg via INTRAVENOUS

## 2020-05-08 MED ORDER — PROPOFOL 10 MG/ML IV BOLUS
INTRAVENOUS | Status: AC
  Filled 2020-05-08: qty 20

## 2020-05-08 MED ORDER — FENTANYL CITRATE (PF) 250 MCG/5ML IJ SOLN
INTRAMUSCULAR | Status: AC
  Filled 2020-05-08: qty 5

## 2020-05-08 MED ORDER — ALBUMIN HUMAN 5 % IV SOLN: INTRAVENOUS | Status: DC | PRN

## 2020-05-08 MED ORDER — ORAL CARE MOUTH RINSE: 15.0000 mL | Freq: Once | OROMUCOSAL | Status: AC

## 2020-05-08 MED ORDER — DOCUSATE SODIUM 100 MG PO CAPS: 100.0000 mg | ORAL_CAPSULE | Freq: Every day | ORAL | Status: DC

## 2020-05-08 MED ORDER — HYDROMORPHONE HCL 1 MG/ML IJ SOLN
INTRAMUSCULAR | Status: AC
  Filled 2020-05-08: qty 1

## 2020-05-08 MED ORDER — PANTOPRAZOLE SODIUM 40 MG PO TBEC: 40.0000 mg | DELAYED_RELEASE_TABLET | Freq: Every day | ORAL | Status: DC

## 2020-05-08 MED ORDER — SODIUM CHLORIDE 0.9 % IV SOLN: INTRAVENOUS | Status: DC

## 2020-05-08 MED ORDER — PANTOPRAZOLE SODIUM 40 MG IV SOLR
40.0000 mg | INTRAVENOUS | Status: DC
  Administered 2020-05-08 – 2020-05-12 (×5): 40 mg via INTRAVENOUS
  Filled 2020-05-08 (×5): qty 40

## 2020-05-08 MED ORDER — METOPROLOL TARTRATE 5 MG/5ML IV SOLN: 2.0000 mg | INTRAVENOUS | Status: DC | PRN

## 2020-05-08 MED ORDER — LACTATED RINGERS IV SOLN: INTRAVENOUS | Status: DC | PRN

## 2020-05-08 MED ORDER — CEFAZOLIN SODIUM-DEXTROSE 2-4 GM/100ML-% IV SOLN
2.0000 g | Freq: Three times a day (TID) | INTRAVENOUS | Status: AC
  Administered 2020-05-08 – 2020-05-09 (×2): 2 g via INTRAVENOUS
  Filled 2020-05-08 (×2): qty 100

## 2020-05-08 MED ORDER — SODIUM CHLORIDE 0.9% IV SOLUTION: Freq: Once | INTRAVENOUS | Status: DC

## 2020-05-08 MED ORDER — HEMOSTATIC AGENTS (NO CHARGE) OPTIME
TOPICAL | Status: DC | PRN
  Administered 2020-05-08 (×2): 1 via TOPICAL

## 2020-05-08 MED ORDER — DEXAMETHASONE SODIUM PHOSPHATE 10 MG/ML IJ SOLN
INTRAMUSCULAR | Status: DC | PRN
  Administered 2020-05-08: 10 mg via INTRAVENOUS

## 2020-05-08 MED ORDER — AMISULPRIDE (ANTIEMETIC) 5 MG/2ML IV SOLN: 10.0000 mg | Freq: Once | INTRAVENOUS | Status: DC | PRN

## 2020-05-08 MED ORDER — ACETAMINOPHEN 325 MG PO TABS
325.0000 mg | ORAL_TABLET | ORAL | Status: DC | PRN
  Administered 2020-05-10 (×2): 650 mg
  Filled 2020-05-08 (×3): qty 2

## 2020-05-08 MED ORDER — PHENYLEPHRINE 40 MCG/ML (10ML) SYRINGE FOR IV PUSH (FOR BLOOD PRESSURE SUPPORT)
PREFILLED_SYRINGE | INTRAVENOUS | Status: DC | PRN
  Administered 2020-05-08: 20 ug via INTRAVENOUS
  Administered 2020-05-08: 80 ug via INTRAVENOUS
  Administered 2020-05-08 (×2): 20 ug via INTRAVENOUS
  Administered 2020-05-08: 40 ug via INTRAVENOUS
  Administered 2020-05-08: 20 ug via INTRAVENOUS
  Administered 2020-05-08 (×3): 80 ug via INTRAVENOUS
  Administered 2020-05-08: 40 ug via INTRAVENOUS
  Administered 2020-05-08: 20 ug via INTRAVENOUS
  Administered 2020-05-08 (×2): 40 ug via INTRAVENOUS

## 2020-05-08 MED ORDER — CHLORHEXIDINE GLUCONATE 0.12 % MT SOLN
OROMUCOSAL | Status: AC
  Administered 2020-05-08: 15 mL via OROMUCOSAL
  Filled 2020-05-08: qty 15

## 2020-05-08 MED ORDER — ONDANSETRON HCL 4 MG/2ML IJ SOLN: 4.0000 mg | Freq: Four times a day (QID) | INTRAMUSCULAR | Status: DC | PRN

## 2020-05-08 MED ORDER — PROTAMINE SULFATE 10 MG/ML IV SOLN
INTRAVENOUS | Status: DC | PRN
  Administered 2020-05-08 (×4): 10 mg via INTRAVENOUS

## 2020-05-08 MED ORDER — OXYCODONE-ACETAMINOPHEN 5-325 MG PO TABS
1.0000 | ORAL_TABLET | ORAL | Status: DC | PRN
  Administered 2020-05-11 – 2020-05-13 (×7): 2
  Administered 2020-05-13: 1
  Filled 2020-05-08: qty 1
  Filled 2020-05-08 (×8): qty 2

## 2020-05-08 MED ORDER — SODIUM CHLORIDE 0.9 % IV SOLN
INTRAVENOUS | Status: AC
  Filled 2020-05-08: qty 1.2

## 2020-05-08 MED ORDER — PHENOL 1.4 % MT LIQD
1.0000 | OROMUCOSAL | Status: DC | PRN
  Administered 2020-05-09 – 2020-05-10 (×4): 1 via OROMUCOSAL
  Filled 2020-05-08: qty 177

## 2020-05-08 MED ORDER — ONDANSETRON HCL 4 MG/2ML IJ SOLN
INTRAMUSCULAR | Status: AC
  Filled 2020-05-08: qty 2

## 2020-05-08 MED ORDER — LIDOCAINE 2% (20 MG/ML) 5 ML SYRINGE
INTRAMUSCULAR | Status: AC
  Filled 2020-05-08: qty 5

## 2020-05-08 MED ORDER — LACTATED RINGERS IV SOLN: INTRAVENOUS | Status: DC

## 2020-05-08 MED ORDER — PHENYLEPHRINE 40 MCG/ML (10ML) SYRINGE FOR IV PUSH (FOR BLOOD PRESSURE SUPPORT)
PREFILLED_SYRINGE | INTRAVENOUS | Status: AC
  Filled 2020-05-08: qty 10

## 2020-05-08 MED ORDER — ACETAMINOPHEN 500 MG PO TABS
1000.0000 mg | ORAL_TABLET | Freq: Once | ORAL | Status: AC
  Administered 2020-05-08: 1000 mg via ORAL
  Filled 2020-05-08: qty 2

## 2020-05-08 MED ORDER — ACETAMINOPHEN 325 MG PO TABS
325.0000 mg | ORAL_TABLET | ORAL | Status: DC | PRN
Start: 2020-05-08 — End: 2020-05-08

## 2020-05-08 MED ORDER — 0.9 % SODIUM CHLORIDE (POUR BTL) OPTIME
TOPICAL | Status: DC | PRN
  Administered 2020-05-08: 3000 mL

## 2020-05-08 MED ORDER — BISACODYL 5 MG PO TBEC
5.0000 mg | DELAYED_RELEASE_TABLET | Freq: Every day | ORAL | Status: DC | PRN
  Administered 2020-05-12 – 2020-05-14 (×2): 5 mg via ORAL
  Filled 2020-05-08 (×2): qty 1

## 2020-05-08 MED ORDER — MAGNESIUM SULFATE 2 GM/50ML IV SOLN: 2.0000 g | Freq: Every day | INTRAVENOUS | Status: DC | PRN

## 2020-05-08 MED ORDER — CHLORHEXIDINE GLUCONATE CLOTH 2 % EX PADS
6.0000 | MEDICATED_PAD | Freq: Every day | CUTANEOUS | Status: DC
  Administered 2020-05-09 – 2020-05-11 (×3): 6 via TOPICAL

## 2020-05-08 MED ORDER — PHENYLEPHRINE HCL-NACL 10-0.9 MG/250ML-% IV SOLN
INTRAVENOUS | Status: DC | PRN
  Administered 2020-05-08: 25 ug/min via INTRAVENOUS

## 2020-05-08 MED ORDER — HEPARIN SODIUM (PORCINE) 1000 UNIT/ML IJ SOLN
INTRAMUSCULAR | Status: DC | PRN
  Administered 2020-05-08 (×2): 3000 [IU] via INTRAVENOUS
  Administered 2020-05-08: 7000 [IU] via INTRAVENOUS

## 2020-05-08 MED ORDER — FENTANYL CITRATE (PF) 250 MCG/5ML IJ SOLN
INTRAMUSCULAR | Status: DC | PRN
  Administered 2020-05-08: 50 ug via INTRAVENOUS
  Administered 2020-05-08: 25 ug via INTRAVENOUS
  Administered 2020-05-08 (×3): 50 ug via INTRAVENOUS
  Administered 2020-05-08: 25 ug via INTRAVENOUS
  Administered 2020-05-08 (×4): 50 ug via INTRAVENOUS
  Administered 2020-05-08: 25 ug via INTRAVENOUS
  Administered 2020-05-08: 50 ug via INTRAVENOUS

## 2020-05-08 MED ORDER — ACETAMINOPHEN 325 MG RE SUPP
325.0000 mg | RECTAL | Status: DC | PRN
Start: 2020-05-08 — End: 2020-05-08

## 2020-05-08 MED ORDER — HEPARIN SODIUM (PORCINE) 1000 UNIT/ML IJ SOLN
INTRAMUSCULAR | Status: AC
  Filled 2020-05-08: qty 1

## 2020-05-08 MED ORDER — HYDROMORPHONE HCL 1 MG/ML IJ SOLN
0.2500 mg | INTRAMUSCULAR | Status: DC | PRN
  Administered 2020-05-08: 0.5 mg via INTRAVENOUS

## 2020-05-08 MED ORDER — ROCURONIUM BROMIDE 10 MG/ML (PF) SYRINGE
PREFILLED_SYRINGE | INTRAVENOUS | Status: DC | PRN
  Administered 2020-05-08: 10 mg via INTRAVENOUS
  Administered 2020-05-08: 100 mg via INTRAVENOUS
  Administered 2020-05-08: 30 mg via INTRAVENOUS
  Administered 2020-05-08: 50 mg via INTRAVENOUS

## 2020-05-08 MED ORDER — HEPARIN SODIUM (PORCINE) 5000 UNIT/ML IJ SOLN
5000.0000 [IU] | Freq: Three times a day (TID) | INTRAMUSCULAR | Status: DC
  Administered 2020-05-08 – 2020-05-16 (×24): 5000 [IU] via SUBCUTANEOUS
  Filled 2020-05-08 (×24): qty 1

## 2020-05-08 MED ORDER — DEXAMETHASONE SODIUM PHOSPHATE 10 MG/ML IJ SOLN
INTRAMUSCULAR | Status: AC
  Filled 2020-05-08: qty 1

## 2020-05-08 MED ORDER — DOCUSATE SODIUM 50 MG/5ML PO LIQD
100.0000 mg | Freq: Every day | ORAL | Status: DC
  Administered 2020-05-09 – 2020-05-13 (×4): 100 mg
  Filled 2020-05-08 (×4): qty 10

## 2020-05-08 MED ORDER — POTASSIUM CHLORIDE CRYS ER 20 MEQ PO TBCR: 20.0000 meq | EXTENDED_RELEASE_TABLET | Freq: Every day | ORAL | Status: DC | PRN

## 2020-05-08 MED ORDER — SUGAMMADEX SODIUM 200 MG/2ML IV SOLN
INTRAVENOUS | Status: DC | PRN
  Administered 2020-05-08: 200 mg via INTRAVENOUS

## 2020-05-08 MED ORDER — CHLORHEXIDINE GLUCONATE CLOTH 2 % EX PADS: 6.0000 | MEDICATED_PAD | Freq: Once | CUTANEOUS | Status: DC

## 2020-05-08 MED ORDER — SENNOSIDES-DOCUSATE SODIUM 8.6-50 MG PO TABS: 1.0000 | ORAL_TABLET | Freq: Every evening | ORAL | Status: DC | PRN

## 2020-05-08 MED ORDER — OXYCODONE-ACETAMINOPHEN 5-325 MG PO TABS: 1.0000 | ORAL_TABLET | ORAL | Status: DC | PRN

## 2020-05-08 MED ORDER — MIDAZOLAM HCL 2 MG/2ML IJ SOLN
INTRAMUSCULAR | Status: AC
  Filled 2020-05-08: qty 2

## 2020-05-08 MED ORDER — KCL IN DEXTROSE-NACL 20-5-0.45 MEQ/L-%-% IV SOLN
INTRAVENOUS | Status: DC
  Filled 2020-05-08 (×12): qty 1000

## 2020-05-08 MED ORDER — HYDRALAZINE HCL 20 MG/ML IJ SOLN: 5.0000 mg | INTRAMUSCULAR | Status: DC | PRN

## 2020-05-08 MED ORDER — HYDROMORPHONE HCL 1 MG/ML IJ SOLN
0.5000 mg | INTRAMUSCULAR | Status: DC | PRN
  Administered 2020-05-08 – 2020-05-09 (×3): 0.5 mg via INTRAVENOUS
  Administered 2020-05-09: 1 mg via INTRAVENOUS
  Administered 2020-05-09: 0.5 mg via INTRAVENOUS
  Administered 2020-05-09 – 2020-05-11 (×10): 1 mg via INTRAVENOUS
  Administered 2020-05-12: 0.5 mg via INTRAVENOUS
  Filled 2020-05-08 (×16): qty 1

## 2020-05-08 MED ORDER — MIDAZOLAM HCL 5 MG/5ML IJ SOLN
INTRAMUSCULAR | Status: DC | PRN
  Administered 2020-05-08 (×2): 1 mg via INTRAVENOUS

## 2020-05-08 MED ORDER — ALUM & MAG HYDROXIDE-SIMETH 200-200-20 MG/5ML PO SUSP
15.0000 mL | ORAL | Status: DC | PRN
  Administered 2020-05-12: 30 mL
  Filled 2020-05-08: qty 30

## 2020-05-08 MED ORDER — LABETALOL HCL 5 MG/ML IV SOLN
10.0000 mg | INTRAVENOUS | Status: DC | PRN
  Filled 2020-05-08: qty 4

## 2020-05-08 MED ORDER — POTASSIUM CHLORIDE 20 MEQ PO PACK: 20.0000 meq | PACK | Freq: Every day | ORAL | Status: DC | PRN

## 2020-05-08 MED ORDER — DEXMEDETOMIDINE (PRECEDEX) IN NS 20 MCG/5ML (4 MCG/ML) IV SYRINGE
PREFILLED_SYRINGE | INTRAVENOUS | Status: DC | PRN
  Administered 2020-05-08: 8 ug via INTRAVENOUS

## 2020-05-08 MED ORDER — PROPOFOL 10 MG/ML IV BOLUS
INTRAVENOUS | Status: DC | PRN
  Administered 2020-05-08 (×2): 20 mg via INTRAVENOUS
  Administered 2020-05-08: 30 mg via INTRAVENOUS
  Administered 2020-05-08: 20 mg via INTRAVENOUS
  Administered 2020-05-08: 140 mg via INTRAVENOUS

## 2020-05-08 MED ORDER — FLEET ENEMA 7-19 GM/118ML RE ENEM
1.0000 | ENEMA | Freq: Once | RECTAL | Status: DC | PRN
  Filled 2020-05-08: qty 1

## 2020-05-08 MED ORDER — CHLORHEXIDINE GLUCONATE 0.12 % MT SOLN: 15.0000 mL | Freq: Once | OROMUCOSAL | Status: AC

## 2020-05-08 MED ORDER — LIDOCAINE 2% (20 MG/ML) 5 ML SYRINGE
INTRAMUSCULAR | Status: DC | PRN
  Administered 2020-05-08: 100 mg via INTRAVENOUS

## 2020-05-08 MED ORDER — CEFAZOLIN SODIUM-DEXTROSE 2-4 GM/100ML-% IV SOLN
2.0000 g | INTRAVENOUS | Status: AC
  Administered 2020-05-08 (×2): 2 g via INTRAVENOUS
  Filled 2020-05-08: qty 100

## 2020-05-08 MED ORDER — SODIUM CHLORIDE 0.9 % IV SOLN: 500.0000 mL | Freq: Once | INTRAVENOUS | Status: DC | PRN

## 2020-05-08 MED ORDER — ACETAMINOPHEN 325 MG RE SUPP
325.0000 mg | RECTAL | Status: DC | PRN
  Administered 2020-05-10 (×2): 650 mg via RECTAL
  Filled 2020-05-08 (×6): qty 2

## 2020-05-08 SURGICAL SUPPLY — 92 items
BANDAGE ESMARK 6X9 LF (GAUZE/BANDAGES/DRESSINGS) IMPLANT
BLADE 11 SAFETY STRL DISP (BLADE) ×3 IMPLANT
BNDG ESMARK 6X9 LF (GAUZE/BANDAGES/DRESSINGS)
CANISTER SUCT 3000ML PPV (MISCELLANEOUS) ×3 IMPLANT
CANNULA VESSEL 3MM 2 BLNT TIP (CANNULA) ×6 IMPLANT
CATH EMB 3FR 40CM (CATHETERS) ×3 IMPLANT
CATH EMB 4FR 80CM (CATHETERS) ×3 IMPLANT
CLIP VESOCCLUDE MED 24/CT (CLIP) ×6 IMPLANT
CLIP VESOCCLUDE SM WIDE 24/CT (CLIP) ×3 IMPLANT
CNTNR URN SCR LID CUP LEK RST (MISCELLANEOUS) ×2 IMPLANT
CONT SPEC 4OZ STRL OR WHT (MISCELLANEOUS) ×3
COVER MAYO STAND STRL (DRAPES) ×3 IMPLANT
COVER WAND RF STERILE (DRAPES) IMPLANT
CUFF TOURN SGL QUICK 24 (TOURNIQUET CUFF) ×3
CUFF TOURN SGL QUICK 34 (TOURNIQUET CUFF)
CUFF TOURN SGL QUICK 42 (TOURNIQUET CUFF) IMPLANT
CUFF TRNQT CYL 24X4X16.5-23 (TOURNIQUET CUFF) ×2 IMPLANT
CUFF TRNQT CYL 34X4.125X (TOURNIQUET CUFF) IMPLANT
DERMABOND ADVANCED (GAUZE/BANDAGES/DRESSINGS) ×4
DERMABOND ADVANCED .7 DNX12 (GAUZE/BANDAGES/DRESSINGS) ×8 IMPLANT
DRAIN CHANNEL 15F RND FF W/TCR (WOUND CARE) IMPLANT
DRAPE HALF SHEET 40X57 (DRAPES) ×3 IMPLANT
DRAPE X-RAY CASS 24X20 (DRAPES) IMPLANT
ELECT BLADE 4.0 EZ CLEAN MEGAD (MISCELLANEOUS) ×3
ELECT BLADE 6.5 EXT (BLADE) ×3 IMPLANT
ELECT CAUTERY BLADE 6.4 (BLADE) IMPLANT
ELECT REM PT RETURN 9FT ADLT (ELECTROSURGICAL) ×3
ELECTRODE BLDE 4.0 EZ CLN MEGD (MISCELLANEOUS) ×2 IMPLANT
ELECTRODE REM PT RTRN 9FT ADLT (ELECTROSURGICAL) ×2 IMPLANT
EVACUATOR SILICONE 100CC (DRAIN) IMPLANT
FELT TEFLON 1X6 (MISCELLANEOUS) ×3 IMPLANT
GLOVE BIOGEL PI IND STRL 7.5 (GLOVE) ×4 IMPLANT
GLOVE BIOGEL PI INDICATOR 7.5 (GLOVE) ×2
GLOVE SURG SS PI 7.5 STRL IVOR (GLOVE) ×6 IMPLANT
GLOVE SURG UNDER POLY LF SZ6.5 (GLOVE) ×3 IMPLANT
GOWN STRL REUS W/ TWL LRG LVL3 (GOWN DISPOSABLE) ×8 IMPLANT
GOWN STRL REUS W/ TWL XL LVL3 (GOWN DISPOSABLE) ×2 IMPLANT
GOWN STRL REUS W/TWL LRG LVL3 (GOWN DISPOSABLE) ×12
GOWN STRL REUS W/TWL XL LVL3 (GOWN DISPOSABLE) ×3
GRAFT HEMASHIELD 16X8MM (Vascular Products) ×3 IMPLANT
GRAFT PROPATEN W/RING 6X80X60 (Vascular Products) ×3 IMPLANT
HEMOSTAT SNOW SURGICEL 2X4 (HEMOSTASIS) IMPLANT
INSERT FOGARTY 61MM (MISCELLANEOUS) ×6 IMPLANT
INSERT FOGARTY SM (MISCELLANEOUS) ×12 IMPLANT
KIT BASIN OR (CUSTOM PROCEDURE TRAY) ×3 IMPLANT
KIT TURNOVER KIT B (KITS) ×3 IMPLANT
LOOP VESSEL MAXI BLUE (MISCELLANEOUS) ×3 IMPLANT
LOOP VESSEL MINI RED (MISCELLANEOUS) ×6 IMPLANT
MARKER GRAFT CORONARY BYPASS (MISCELLANEOUS) IMPLANT
NS IRRIG 1000ML POUR BTL (IV SOLUTION) ×9 IMPLANT
PACK AORTA (CUSTOM PROCEDURE TRAY) ×3 IMPLANT
PACK PERIPHERAL VASCULAR (CUSTOM PROCEDURE TRAY) IMPLANT
PAD ARMBOARD 7.5X6 YLW CONV (MISCELLANEOUS) ×6 IMPLANT
PENCIL BUTTON HOLSTER BLD 10FT (ELECTRODE) ×3 IMPLANT
SET COLLECT BLD 21X3/4 12 (NEEDLE) IMPLANT
SPONGE LAP 18X18 X RAY DECT (DISPOSABLE) ×3 IMPLANT
STOPCOCK 4 WAY LG BORE MALE ST (IV SETS) ×3 IMPLANT
SURGIFLO W/THROMBIN 8M KIT (HEMOSTASIS) ×6 IMPLANT
SUT ETHIBOND 5 LR DA (SUTURE) ×3 IMPLANT
SUT ETHILON 3 0 PS 1 (SUTURE) IMPLANT
SUT GORETEX 6.0 TT13 (SUTURE) IMPLANT
SUT GORETEX 6.0 TT9 (SUTURE) IMPLANT
SUT MNCRL AB 4-0 PS2 18 (SUTURE) ×3 IMPLANT
SUT PDS AB 1 TP1 54 (SUTURE) ×6 IMPLANT
SUT PROLENE 3 0 SH 48 (SUTURE) ×6 IMPLANT
SUT PROLENE 3 0 SH DA (SUTURE) ×3 IMPLANT
SUT PROLENE 5 0 C 1 24 (SUTURE) ×24 IMPLANT
SUT PROLENE 5 0 C 1 36 (SUTURE) IMPLANT
SUT PROLENE 6 0 BV (SUTURE) ×12 IMPLANT
SUT PROLENE 7 0 BV 1 (SUTURE) IMPLANT
SUT SILK 2 0 (SUTURE) ×3
SUT SILK 2 0 SH (SUTURE) ×3 IMPLANT
SUT SILK 2 0 TIES 17X18 (SUTURE) ×3
SUT SILK 2 0SH CR/8 30 (SUTURE) ×3 IMPLANT
SUT SILK 2-0 18XBRD TIE 12 (SUTURE) ×2 IMPLANT
SUT SILK 2-0 18XBRD TIE BLK (SUTURE) ×2 IMPLANT
SUT SILK 3 0 (SUTURE) ×3
SUT SILK 3 0 TIES 17X18 (SUTURE) ×3
SUT SILK 3-0 18XBRD TIE 12 (SUTURE) ×2 IMPLANT
SUT SILK 3-0 18XBRD TIE BLK (SUTURE) ×2 IMPLANT
SUT VIC AB 2-0 CT1 27 (SUTURE) ×18
SUT VIC AB 2-0 CT1 TAPERPNT 27 (SUTURE) ×12 IMPLANT
SUT VIC AB 3-0 SH 27 (SUTURE) ×12
SUT VIC AB 3-0 SH 27X BRD (SUTURE) ×8 IMPLANT
SUT VICRYL 4-0 PS2 18IN ABS (SUTURE) ×9 IMPLANT
SYR 3ML LL SCALE MARK (SYRINGE) ×3 IMPLANT
TOWEL GREEN STERILE (TOWEL DISPOSABLE) ×3 IMPLANT
TOWEL SURG RFD BLUE STRL DISP (DISPOSABLE) ×6 IMPLANT
TRAY FOLEY MTR SLVR 16FR STAT (SET/KITS/TRAYS/PACK) ×3 IMPLANT
TUBING EXTENTION W/L.L. (IV SETS) IMPLANT
UNDERPAD 30X36 HEAVY ABSORB (UNDERPADS AND DIAPERS) ×3 IMPLANT
WATER STERILE IRR 1000ML POUR (IV SOLUTION) ×6 IMPLANT

## 2020-05-08 NOTE — Interval H&P Note (Signed)
History and Physical Interval Note:  05/08/2020 7:26 AM  Benjamin [name]  has presented today for surgery, with the diagnosis of fem-pop occlusion.  The various methods of treatment have been discussed with the patient and family. After consideration of risks, benefits and other options for treatment, the patient has consented to  Procedure(s): POSSIBLE LEFT REDO BYPASS GRAFT FEMORAL-POPLITEAL ARTERY (Left) AORTA BIFEMORAL BYPASS GRAFT (N/A) as a surgical intervention.  The patient's history has been reviewed, patient examined, no change in status, stable for surgery.  I have reviewed the patient's chart and labs.  Questions were answered to the patient's satisfaction.     [name]

## 2020-05-08 NOTE — Anesthesia Procedure Notes (Signed)
Procedure Name: Intubation Date/Time: 05/08/2020 7:50 AM Performed by: Nils Pyle, CRNA Pre-anesthesia Checklist: Patient identified, Emergency Drugs available, Suction available and Patient being monitored Patient Re-evaluated:Patient Re-evaluated prior to induction Oxygen Delivery Method: Circle System Utilized Preoxygenation: Pre-oxygenation with 100% oxygen Induction Type: IV induction Ventilation: Mask ventilation without difficulty and Oral airway inserted - appropriate to patient size Laryngoscope Size: Hyacinth Meeker and 2 Grade View: Grade I Tube type: Oral Tube size: 7.5 mm Number of attempts: 1 Airway Equipment and Method: Stylet and Oral airway Placement Confirmation: ETT inserted through vocal cords under direct vision,  positive ETCO2 and breath sounds checked- equal and bilateral Secured at: 23 cm Tube secured with: Tape Dental Injury: Teeth and Oropharynx as per pre-operative assessment

## 2020-05-08 NOTE — Op Note (Signed)
Patient name: Benjamin [name] MRN: [id number] DOB: Jan 07, 1961 Sex: male  05/08/2020 Pre-operative Diagnosis: Left foot ulcer Post-operative diagnosis:  Same Surgeon:  [name] Assistants:  Gretta Began, Andria Frames Procedure:   #1:  Aorto bifemoral bypass (16x8)   #2:  Re-do left femoral below knee popliteal bypass with 79mm PTFE   #3:  Re-do bilateral femoral artery exposure    #4: Redo below-knee popliteal artery exposure Anesthesia:  General Blood Loss:  400 cc Specimens:  Peri-arotic lymph nodes to pathology  Findings: Proximal aortic anastomosis was end to end with a felt strip.  I did not reimplant the inferior mesenteric artery as there was a brisk signal after the procedure and the artery appeared occluded at its origin.  The right groin anastomosis was to the common femoral artery, well above the profunda origin.  The left groin anastomosis was end to end at the level of the primary profunda branch.  The common femoral and proximal profundofemoral arteries were occluded.  The below-knee popliteal anastomosis was end to side to the distal below-knee popliteal artery.  I did not divide the anterior tibial vein as it was large in caliber and heavily scarred in.  There was a peroneal Doppler signal at the end of the procedure.  The patient had several large periaortic lymph nodes which were sent to pathology.  Indications: This is a 60 year old gentleman who recently was found to have a left foot ulcer.  He had an occluded left iliac and common femoral artery.  I stented his right iliac system and then performed a right to left femoral-femoral bypass graft and left femoral to below-knee popliteal artery bypass with vein.  He showed up in the office a few days ago with rest pain and a new ulcer.  His grafts were occluded.  I discussed with the patient that I feel the best option now is an aortobifemoral bypass graft and redo left femoral-popliteal graft.  The risks and  benefits were discussed with the patient and he wished to proceed.  Procedure:  The patient was identified in the holding area and taken to [facility] OR ROOM 12  The patient was then placed supine on the table. general anesthesia was administered.  The patient was prepped and draped in the usual sterile fashion.  A time out was called and antibiotics were administered.  An assistant was necessary to expedite the procedure and assist with dissection of the femoral vessels and help with the anastomoses and exposure.  The patient's previous longitudinal groin incisions were opened with a 10 blade.  Cautery and sharp dissection were used on the right to dissect out the right limb of the femoral-femoral graft.  The common femoral artery was circumferentially dissected free proximal and distal to the anastomosis.  The profunda was not isolated.  On the left, I dissected out the proximal common femoral artery above the anastomosis of the femoral-femoral graft.  The superficial femoral artery had previously been transected.  I then dissected out the profundofemoral artery and its primary branches.  I also isolated the vein femoral-popliteal graft.  Once adequate exposure was obtained, the groins were packed with Ray-Tec's and attention was turned towards the abdomen.  A midline incision was made from the xiphoid down below the umbilicus.  Cautery was used to divide the subcutaneous tissue down to the fascia which was opened with cautery.  I then entered the peritoneal cavity sharply and then opened the i fascia throughout the length of  the incision.  The patient had a large liver and so the falciform ligament was divided.  The abdomen was then inspected.  There was no gross pathology.  The NG tube was confirmed to be in the stomach.  Next the transverse colon was reflected cephalad and the small bowel was moved to the patient's right.  The ligament of Treitz was then taken down with cautery and sharp dissection.  A Balfour  was then placed followed by a Omni-Tract retractor which was used to help with exposure.  I then proceeded to dissect out the abdominal aorta.  The inferior mesenteric vein was not divided.  There were large lymph nodes over top of the aorta which were sent to pathology.  The inferior mesenteric artery was encircled with a vessel loop.  I then dissected out the proximal infrarenal aorta.  An umbilical tape was passed circumferentially.  Next the aortic bifurcation and the right and left common iliac arteries were exposed.  I then created a tunnel into each groin over top of the iliac arteries, posterior to the ureter.  An umbilical tape was then passed.  The patient was then fully heparinized.  After the heparin circulated, a Zanger clamp was placed on the distal aorta and a Harken clamp was placed on the proximal aorta below the renal arteries.  A #11 blade was used to divide the aorta.  Several lumbar branches were also divided between silk ties.  The distal end of the aorta was oversewn with a 3-0 Prolene in 2 layers.  The Zanger clamp was removed and the stump of the aorta was hemostatic.  Next, I performed an aortic endarterectomy removing all the plaque up to the clamp.  I selected a 16 x 8 dacryon graft.  The tube portion was cut so that the bifurcation of the graft matched the native aorta.  A end to end anastomosis was performed with a running 3-0 Prolene incorporating a felt strip.  Once this was completed, clamps were placed on the limbs of the graft and the Harken clamp was removed.  The proximal anastomosis was hemostatic.  Next, the limbs of the graft were brought through their respective tunnels.  Attention was first turned towards the right groin.  The common femoral artery was occluded with vascular clamps.  I then divided the femoral-femoral graft.  I ultimately ended up resecting all of the graft off of the common femoral artery.  The artery was healthy in appearance.  There was good backbleeding  when I removed the clamp temporarily.  The right limb of the graft was then cut to the appropriate length and a end-to-side anastomosis was performed with 5-0 Prolene.  Prior to completion the appropriate flushing maneuvers were performed and the anastomosis was completed.  There were brisk Doppler signals distal to the anastomosis.  Next attention was turned towards the left groin.  Similarly, I transected the femoral-femoral graft after occluding the femoral vessels.  I also removed the femoral-popliteal vein graft.  The common femoral and profundofemoral artery proximally or occluded.  I then proceeded to dissect out further profunda branches and then opened the profunda until I got into a lumen.  Because of the significant proximal occlusion I elected to transect the profundofemoral artery and perform a end to end anastomosis.  There was good backbleeding from 3 primary profunda branches.  A end-to-end anastomosis was created with a running 5-0 Prolene.  Prior to completion, the appropriate flushing maneuvers were performed and the anastomosis was completed.  There was a brisk Doppler signal in the profunda branches.  Next attention was turned towards the below-knee popliteal artery.  The previous medial below-knee incision was opened with a 10 blade.  Cautery was used to divide subcutaneous tissue down to the fascia which was opened with cautery.  I then reflected the gastrocnemius muscle posteriorly.  Soleal attachments to the tibia were taken down with cautery until I exposed the popliteal vein.  Several rents were made in the popliteal vein which required suture closure.  I then encountered the anterior tibial vein and tried to get this divided however because of the scar tissue and bleeding from this area I was unable to do so.  I then dissected out the below-knee popliteal artery, distal to the previous vein anastomosis.  The popliteal artery at this level was soft and I had adequate exposure for an  anastomosis.  Next, a long Gore tunneler was used to create a subsartorial tunnel up to the groin.  A 6 mm external ring propatent PTFE graft was brought through the tunnel.  A tourniquet was placed on the upper thigh and the leg was exsanguinated with an Esmarch.  The tourniquet was then taken to 250 mm of pressure.  The anastomosis in the left groin was performed to the anterior surface of the left limb of the aortobifemoral graft.  It was approximately 1 cm above the end to end anastomosis to the profundofemoral artery.  The graft after being occluded was opened with an 11 blade.  A end to side anastomosis to the PTFE graft was performed with 6-0 Prolene.  I then opened the below-knee popliteal artery with a #11 blade and extended longitudinally with Potts scissors.  There was adequate lumen.  The intima was somewhat thickened however it appeared adequate for anastomosis.  The leg was straightened and the graft was cut to the appropriate length and spatulated to fit the size of the arteriotomy.  A running anastomosis was then created with 6-0 Prolene.  Prior to completion, the tourniquet was let down and the appropriate flushing maneuvers were performed.  The anastomosis was then completed.  Blood flow was reestablished to the left leg.  At this point, the patient had a brisk peroneal Doppler signal.  The patient's heparin was then reversed with 50 mg of protamine.  All wounds were irrigated.  Once the leg was hemostatic, the below-knee incision was closed by reapproximating the fascia with 2-0 Vicryl.  The subcutaneous tissue was closed with 3-0 Vicryl followed by subcuticular closure.  The groins were then irrigated and found to be hemostatic.  The femoral sheath was reapproximated with 2-0 Vicryl.  The subcutaneous tissue was closed with several layers of 3-0 Vicryl followed by subcuticular closure.  Next attention was turned back towards the abdomen.  I evaluated the inferior mesenteric artery with Doppler.   It appeared to be occluded proximally.  About 4 cm distal to its origin, there was a triphasic Doppler signal.  Therefore I did not feel that it needed to be reimplanted.  Hemostasis was achieved.  I then reapproximated the retroperitoneum over top of the graft with running 2-0 Vicryl.  The retractors were then removed.  The small bowel was run from the ligament of Treitz to the cecum and was without defect.  It was placed back into the anatomic position making sure that the mesentery was without kinks.  The omentum was then placed over top of the bowel.  The fascia was then closed with 2  running #1 PDS suture.  The subcutaneous tissue was closed with 2-0 Vicryl and the skin was closed with 3-0 Vicryl.  Dermabond was applied to all incisions.  The patient was then successfully extubated and taken the recovery room in stable condition.  There were no immediate complications.   Disposition: To PACU stable.   Juleen China, M.D., [facility] [place] Office: [phone] Pager:  [phone]

## 2020-05-08 NOTE — Transfer of Care (Signed)
Immediate Anesthesia Transfer of Care Note  Patient: Benjamin [name]  Procedure(s) Performed: LEFT REDO BYPASS GRAFT COMMON FEMORAL- BELOW KNEE POPLITEAL ARTERY USING PROPATEN GRAFT (Left Leg Lower) AORTA BIFEMORAL BYPASS GRAFT (N/A Abdomen)  Patient Location: PACU  Anesthesia Type:General  Level of Consciousness: drowsy and patient cooperative  Airway & Oxygen Therapy: Patient Spontanous Breathing and Patient connected to face mask oxygen  Post-op Assessment: Report given to RN and Post -op Vital signs reviewed and stable  Post vital signs: Reviewed and stable  Last Vitals:  Vitals Value Taken Time  BP 153/95 05/08/20 1421  Temp    Pulse 83 05/08/20 1423  Resp 15 05/08/20 1429  SpO2 99 % 05/08/20 1423  Vitals shown include unvalidated device data.  Last Pain:  Vitals:   05/08/20 0603  TempSrc: Oral         Complications: No complications documented.

## 2020-05-08 NOTE — Anesthesia Procedure Notes (Signed)
Arterial Line Insertion Start/End3/18/2022 7:15 AM, 05/08/2020 7:23 AM Performed by: Nils Pyle, CRNA, CRNA  Patient location: Pre-op. Preanesthetic checklist: patient identified, IV checked, site marked, risks and benefits discussed, surgical consent, monitors and equipment checked, pre-op evaluation and anesthesia consent Lidocaine 1% used for infiltration and patient sedated Left, radial was placed Catheter size: 20 G Hand hygiene performed  and maximum sterile barriers used  Allen's test indicative of satisfactory collateral circulation Attempts: 1 Procedure performed without using ultrasound guided technique. Ultrasound Notes:anatomy identified, needle tip was noted to be adjacent to the nerve/plexus identified and no ultrasound evidence of intravascular and/or intraneural injection Following insertion, dressing applied and Biopatch. Post procedure assessment: normal  Patient tolerated the procedure well with no immediate complications.

## 2020-05-09 ENCOUNTER — Inpatient Hospital Stay (HOSPITAL_COMMUNITY): Payer: Medicaid Other

## 2020-05-09 LAB — COMPREHENSIVE METABOLIC PANEL
ALT: 40 U/L (ref 0–44)
AST: 47 U/L — ABNORMAL HIGH (ref 15–41)
Albumin: 3.4 g/dL — ABNORMAL LOW (ref 3.5–5.0)
Alkaline Phosphatase: 85 U/L (ref 38–126)
Anion gap: 9 (ref 5–15)
BUN: 15 mg/dL (ref 8–23)
CO2: 23 mmol/L (ref 22–32)
Calcium: 8.8 mg/dL — ABNORMAL LOW (ref 8.9–10.3)
Chloride: 103 mmol/L (ref 98–111)
Creatinine, Ser: 0.94 mg/dL (ref 0.61–1.24)
GFR, Estimated: >60 mL/min (ref >60)
Glucose, Bld: 137 mg/dL — ABNORMAL HIGH (ref 70–99)
Potassium: 4.3 mmol/L (ref 3.5–5.1)
Sodium: 135 mmol/L (ref 135–145)
Total Bilirubin: 0.7 mg/dL (ref 0.3–1.2)
Total Protein: 5.8 g/dL — ABNORMAL LOW (ref 6.5–8.1)

## 2020-05-09 LAB — CBC
HCT: 33.8 % — ABNORMAL LOW (ref 39.0–52.0)
Hemoglobin: 12 g/dL — ABNORMAL LOW (ref 13.0–17.0)
MCH: 26.8 pg (ref 26.0–34.0)
MCHC: 35.5 g/dL (ref 30.0–36.0)
MCV: 75.4 fL — ABNORMAL LOW (ref 80.0–100.0)
Platelets: 360 10*3/uL (ref 150–400)
RBC: 4.48 MIL/uL (ref 4.22–5.81)
RDW: 18.3 % — ABNORMAL HIGH (ref 11.5–15.5)
WBC: 12.2 10*3/uL — ABNORMAL HIGH (ref 4.0–10.5)
nRBC: 0 % (ref 0.0–0.2)

## 2020-05-09 LAB — AMYLASE: Amylase: 60 U/L (ref 28–100)

## 2020-05-09 LAB — MAGNESIUM: Magnesium: 1.8 mg/dL (ref 1.7–2.4)

## 2020-05-09 MED ORDER — ORAL CARE MOUTH RINSE
15.0000 mL | Freq: Two times a day (BID) | OROMUCOSAL | Status: DC
  Administered 2020-05-09 – 2020-05-16 (×13): 15 mL via OROMUCOSAL

## 2020-05-09 NOTE — Progress Notes (Signed)
Occupational Therapy Evaluation Patient Details Name: Benjamin [name] MRN: [id number] DOB: 01-15-1961 Today's Date: 05/09/2020  Clinical Impression: Pt is typically modified independent in ADL and mobility with RW. Today he is min A for transfers with RW, able to manage LLE (more painful and swollen than RLE) and is mod A for LB ADL and set up for UB/grooming. Pt will benefit from skilled OT in the acute setting as well as HHOT post-acute. Next session to bring AE for education for access to LB ADL and energy conservation education.    05/09/20 1400  OT Visit Information  Last OT Received On 05/09/20  Assistance Needed +1  History of Present Illness Pt is a 60 y/o male admitted 3/18 and s/p Aorto bifemoral bypass, Re-do left femoral below knee popliteal bypass, Re-do bilateral femoral and popliteal artery exposure. 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  Precautions  Precautions Fall  Precaution Comments NGT  Home Living  Family/patient expects to be discharged to: Private residence  Living Arrangements Other relatives (sister who has MS, but is doing good, can physically assess.)  Available Help at Discharge Family;Available 24 hours/day  Type of Home House  Home Access Stairs to enter  Entrance Stairs-Number of Steps 3  Entrance Stairs-Rails Left;Right;Can reach both  Home Layout One level  Bathroom Shower/Tub Tub/shower unit  Engineer, water - 2 wheels;Shower seat;Grab bars - tub/shower;Wheelchair - manual  Additional Comments pt does not currently work  Prior Horticulturist, commercial / Transfers Assistance Needed using walker prior to  admit, limited walking distance before his left leg would "give out", was driving PTA  ADL's / Homemaking Assistance Needed shared responsibility with sister and her son (who also lives there but works)  Special educational needs teacher / Engineer, manufacturing Needed no difficulty  Communication  Communication No difficulties  Pain Assessment  Pain Assessment 0-10  Pain Score 5  Pain Location left leg and abdomen  Pain Descriptors / Indicators Grimacing;Guarding  Pain Intervention(s) Limited activity within patient's tolerance;Monitored during session;Repositioned  Cognition  Arousal/Alertness Awake/alert  Behavior During Therapy WFL for tasks assessed/performed  Overall Cognitive Status Within Functional Limits for tasks assessed  Upper Extremity Assessment  Upper Extremity Assessment Overall WFL for tasks assessed  Lower Extremity Assessment  Lower Extremity Assessment Defer to PT evaluation  LLE Deficits / Details post op deficits as anticipated  Cervical / Trunk Assessment  Cervical / Trunk Assessment Normal  ADL  Overall ADL's  Needs assistance/impaired  Eating/Feeding NPO  Grooming Set up;Sitting  Upper Body Bathing Minimal assistance;Sitting  Lower Body Bathing Moderate assistance  Upper Body Dressing  Set up;Sitting  Lower Body Dressing Moderate assistance;Sit to/from stand  Lower Body Dressing Details (indicate cue type and reason) educated to dress more painful leg first  Toilet Transfer Minimal assistance;Ambulation;RW  Toileting- IT trainer Min guard;Sit to/from stand  Toileting - Clothing Manipulation Details (indicate cue type and reason) use of rails by toilet to perform sit<>stand  Functional mobility during ADLs Minimal assistance;Rolling walker  General ADL Comments decreased access to BLE, decreased balance and activity tolerance  Bed Mobility  Overal bed mobility Needs Assistance  Bed Mobility Sit to Supine  Sit to supine Mod assist  General bed  mobility comments mod A for BLE  back into bed Pt manages trunk well  Transfers  Overall transfer level Needs assistance  Equipment used Rolling walker (2 wheeled)  Transfers Sit to/from [employer] Transfers  Sit to Stand Min assist  Stand pivot transfers Min assist  General transfer comment Min assist to power up to standwith vc to push from arm rests, use of RW to return to sit EOB  Balance  Overall balance assessment Needs assistance  Sitting-balance support No upper extremity supported;Feet supported  Sitting balance-Leahy Scale Good  Standing balance support Bilateral upper extremity supported  Standing balance-Leahy Scale Poor  Standing balance comment dependent on BUE in seated position  OT - End of Session  Equipment Utilized During Treatment Gait belt;Rolling walker  Activity Tolerance Patient tolerated treatment well  Patient left in bed;with bed alarm set;with call bell/phone within reach  Nurse Communication Mobility status;Precautions;Weight bearing status  OT Assessment  OT Recommendation/Assessment Patient needs continued OT Services  OT Visit Diagnosis Other abnormalities of gait and mobility (R26.89);Pain;Unsteadiness on feet (R26.81)  Pain - Right/Left Left (Bil)  Pain - part of body Leg  OT Problem List Decreased strength;Decreased range of motion;Decreased activity tolerance;Impaired balance (sitting and/or standing);Decreased knowledge of use of DME or AE;Pain;Increased edema  OT Plan  OT Frequency (ACUTE ONLY) Min 2X/week  OT Treatment/Interventions (ACUTE ONLY) Self-care/ADL training;Energy conservation;DME and/or AE instruction;Therapeutic activities;Patient/family education;Balance training  AM-PAC OT "6 Clicks" Daily Activity Outcome Measure (Version 2)  Help from another person eating meals? 4  Help from another person taking care of personal grooming? 3  Help from another person toileting, which includes using toliet, bedpan, or urinal? 3  Help from  another person bathing (including washing, rinsing, drying)? 2  Help from another person to put on and taking off regular upper body clothing? 3  Help from another person to put on and taking off regular lower body clothing? 2  6 Click Score 17  OT Recommendation  Follow Up Recommendations Home health OT;Supervision/Assistance - 24 hour (initially)  OT Equipment 3 in 1 bedside commode  Individuals Consulted  Consulted and Agree with Results and Recommendations Patient  Acute Rehab OT Goals  Patient Stated Goal to get the NGT out of his nose  OT Goal Formulation With patient  Time For Goal Achievement 05/23/20  Potential to Achieve Goals Good  OT Time Calculation  OT Start Time (ACUTE ONLY) 1439  OT Stop Time (ACUTE ONLY) 1502  OT Time Calculation (min) 23 min  OT General Charges  $OT Visit 1 Visit  OT Evaluation  $OT Eval Moderate Complexity 1 Mod  OT Treatments  $Self Care/Home Management  8-22 mins  Written Expression  Dominant Hand Right   Nyoka Cowden OTR/L Acute Rehabilitation Services Pager: [phone] Office: [phone]

## 2020-05-09 NOTE — Progress Notes (Signed)
[facility] [place]  Subjective  - minmal pain mainly in groin   Objective (!) 141/85 95 98.5 F (36.9 C) (Oral) 17 98%  Intake/Output Summary (Last 24 hours) at 05/09/2020 0734 Last data filed at 05/09/2020 0650 Gross per 24 hour  Intake 6022.14 ml  Output 2835 ml  Net 3187.14 ml   Urine output 30-40 cc/hr  Feet pink warm left > right  Doppler signals Incisions clean NG output bilious  Assessment/Planning: POD #1 ABF left fem pop bypass Patent graft Transfer 4E OOB ambulate Leave NG for now  Fabienne Bruns 05/09/2020 7:34 AM --  Laboratory Lab Results: Recent Labs    05/08/20 1450 05/09/20 0330  WBC 15.7* 12.2*  HGB 11.6* 12.0*  HCT 34.4* 33.8*  PLT 344 360   BMET Recent Labs    05/08/20 1450 05/09/20 0330  NA 136 135  K 4.4 4.3  CL 107 103  CO2 21* 23  GLUCOSE 163* 137*  BUN 17 15  CREATININE 0.90 0.94  CALCIUM 8.5* 8.8*    COAG Lab Results  Component Value Date   INR 1.2 05/08/2020   INR 1.1 05/08/2020   INR 1.1 11/13/2019   No results found for: PTT

## 2020-05-09 NOTE — Evaluation (Signed)
Physical Therapy Evaluation Patient Details Name: Benjamin [name] MRN: [id number] DOB: Dec 30, 1960 Today's Date: 05/09/2020   History of Present Illness  Pt is a 60 y/o male admitted 3/18 and s/p Aorto bifemoral bypass, Re-do left femoral below knee popliteal bypass, Re-do bilateral femoral and popliteal artery exposure. 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  Clinical Impression  Pt was able to stand and pivot OOB to the recliner chair today with min assist.  Limited mostly by pain in abdomen and left leg. He chose to remain essentially NWB with pivotal hops to the recliner chair.  I believe he will progress well enough to d/c home with sister and nephew.  He will need to be proficient on stairs prior to d/c.  PT will continue to follow acutely for safe mobility progression.   PT to follow acutely for deficits listed below.   Next session: more LE exercises for left leg.     Follow Up Recommendations Home health PT    Equipment Recommendations  None recommended by PT    Recommendations for Other Services       Precautions / Restrictions Precautions Precautions: Fall Precaution Comments: NGT      Mobility  Bed Mobility Overal bed mobility: Needs Assistance Bed Mobility: Supine to Sit     Supine to sit: Min assist     General bed mobility comments: Min assist to help progress left leg to EOB.    Transfers Overall transfer level: Needs assistance Equipment used: Rolling walker (2 wheeled) Transfers: Sit to/from [employer] Sit to Stand: Min assist Stand pivot transfers: Min assist       General transfer comment: Min assist to power up to stand and steady pt in standing,  min assist for balance while pt essentially hopped a few times over to the recliner chair.  Ambulation/Gait             General Gait Details: Pt did not feel up to ambulating today  Stairs            Wheelchair Mobility    Modified Rankin (Stroke Patients Only)       Balance                                             Pertinent Vitals/Pain Pain Assessment: 0-10 Pain Score: 7  Pain Location: left leg and abdomen Pain Descriptors / Indicators: Grimacing;Guarding Pain Intervention(s): Limited activity within patient's tolerance;Monitored during session;Premedicated before session;Repositioned    Home Living Family/patient expects to be discharged to:: Private residence Living Arrangements: Other relatives (sister who has MS, but is doing good, can physically assess.) Available Help at Discharge: Family;Available 24 hours/day Type of Home: House Home Access: Stairs to enter Entrance Stairs-Rails: Left;Right;Can reach both Entrance Stairs-Number of Steps: 3 Home Layout: One level Home Equipment: Walker - 2 wheels;Shower seat;Grab bars - tub/shower;Wheelchair - manual Additional Comments: pt does not currently work    Prior Function Level of Independence: Needs assistance   Gait / Transfers Assistance Needed: using walker prior to admit, limited walking distance before his left leg would "give out", was driving PTA  ADL's / Homemaking Assistance Needed: shared responsibility with sister and her  son (who also lives there but works)        Higher education careers adviser   Dominant Hand: Right    Extremity/Trunk Assessment   Upper Extremity Assessment Upper Extremity Assessment: Defer to OT evaluation    Lower Extremity Assessment Lower Extremity Assessment: LLE deficits/detail LLE Deficits / Details: left leg is limited by post operative state, swelling and pain.  Ankle 3-/5, knee 3-/5, hip 3-/5    Cervical / Trunk Assessment Cervical / Trunk  Assessment: Normal  Communication   Communication: No difficulties  Cognition Arousal/Alertness: Awake/alert Behavior During Therapy: WFL for tasks assessed/performed Overall Cognitive Status: Within Functional Limits for tasks assessed                                        General Comments      Exercises General Exercises - Lower Extremity Ankle Circles/Pumps: AROM;Both;20 reps (encouraged him to do these bil hourly)   Assessment/Plan    PT Assessment Patient needs continued PT services  PT Problem List Decreased strength;Decreased range of motion;Decreased activity tolerance;Decreased balance;Decreased mobility;Decreased knowledge of use of DME;Pain       PT Treatment Interventions DME instruction;Gait training;Stair training;Functional mobility training;Therapeutic activities;Therapeutic exercise;Balance training;Patient/family education    PT Goals (Current goals can be found in the Care Plan section)  Acute Rehab PT Goals Patient Stated Goal: to get the NGT out of his nose PT Goal Formulation: With patient Time For Goal Achievement: 05/22/20 Potential to Achieve Goals: Good    Frequency Min 3X/week   Barriers to discharge        Co-evaluation               AM-PAC PT "6 Clicks" Mobility  Outcome Measure Help needed turning from your back to your side while in a flat bed without using bedrails?: A Little Help needed moving from lying on your back to sitting on the side of a flat bed without using bedrails?: A Little Help needed moving to and from a bed to a chair (including a wheelchair)?: A Little Help needed standing up from a chair using your arms (e.g., wheelchair or bedside chair)?: A Little Help needed to walk in hospital room?: A Little Help needed climbing 3-5 steps with a railing? : A Lot 6 Click Score: 17    End of Session   Activity Tolerance: Patient limited by pain Patient left: in chair;with call bell/phone within reach;with  chair alarm set Nurse Communication: Mobility status PT Visit Diagnosis: Muscle weakness (generalized) (M62.81);Difficulty in walking, not elsewhere classified (R26.2);Pain Pain - Right/Left: Left Pain - part of body: Leg    Time: 1249-1320 PT Time Calculation (min) (ACUTE ONLY): 31 min   Charges:   PT Evaluation $PT Eval Moderate Complexity: 1 Mod PT Treatments $Therapeutic Activity: 8-22 mins       Corinna Capra, PT, DPT  Acute Rehabilitation [phone] pager [phone]) [phone] office

## 2020-05-10 NOTE — Progress Notes (Addendum)
[facility]  Subjective  - No new complaints.  No Flatus reported.   Objective (!) 137/91 (!) 101 98.4 F (36.9 C) (Oral) 20 90%  Intake/Output Summary (Last 24 hours) at 05/10/2020 0803 Last data filed at 05/10/2020 0539 Gross per 24 hour  Intake 1198.81 ml  Output 1880 ml  Net -681.19 ml    NG tube in place  Op total 450 cc No BS to auscultation Urine OP 1430 last 24 hours Abdominal incision healing well, no distention noted Right LE doppler PT, left DP/PT intact Edema in left LE, elevated   Assessment/Planning: POD # 2 ABF left fem pop bypass  Will maintain NG, neg BS and no flatus.  NPO Encourage mobility and elevation when in bed of LE HGB stable, leukocytosis improving  Mosetta Pigeon 05/10/2020 8:03 AM --  Agree with above Try to ambulate some Leave foley NG one more day  Fabienne Bruns, MD [facility] Office: [phone]  Laboratory Lab Results: Recent Labs    05/08/20 1450 05/09/20 0330  WBC 15.7* 12.2*  HGB 11.6* 12.0*  HCT 34.4* 33.8*  PLT 344 360   BMET Recent Labs    05/08/20 1450 05/09/20 0330  NA 136 135  K 4.4 4.3  CL 107 103  CO2 21* 23  GLUCOSE 163* 137*  BUN 17 15  CREATININE 0.90 0.94  CALCIUM 8.5* 8.8*    COAG Lab Results  Component Value Date   INR 1.2 05/08/2020   INR 1.1 05/08/2020   INR 1.1 11/13/2019   No results found for: PTT

## 2020-05-10 NOTE — Progress Notes (Signed)
Victorino Dike, charge RN came to assess for a RIGHT pedal pulse with doppler and was also unable to locate one. Dr. Darrick Penna was informed via telephone. No new verbal orders received.

## 2020-05-10 NOTE — Progress Notes (Signed)
During routine shift assessment, this RN was unable to locate the patient's RIGHT pedal pulse with the doppler. + cap refill <3 seconds. + posterior tibial pulse with doppler.  This RN paged the vascular specialist number listed on EPIC and a lady answered the phone, took my message about the pedal pulse and stated she would let Dr. Darrick Penna know and that she would have him call me back.

## 2020-05-10 NOTE — Progress Notes (Signed)
Pt seen for lack of doppler signal right foot.  He has subtotal occlusion of the AT/DP on CTA preop.  His primary runoff vessel is the peroneal.  He is not complaining of pain or numbness in the foot on the right.  He has a PT and peroneal doppler signal. Left foot has brisk doppler flow as well.  Fabienne Bruns, MD [facility] Office: [phone]

## 2020-05-10 NOTE — Anesthesia Postprocedure Evaluation (Signed)
Anesthesia Post Note  Patient: Benjamin [name]  Procedure(s) Performed: LEFT REDO BYPASS GRAFT COMMON FEMORAL- BELOW KNEE POPLITEAL ARTERY USING PROPATEN GRAFT (Left Leg Lower) AORTA BIFEMORAL BYPASS GRAFT (N/A Abdomen)     Patient location during evaluation: PACU Anesthesia Type: General Level of consciousness: sedated Pain management: pain level controlled Vital Signs Assessment: post-procedure vital signs reviewed and stable Respiratory status: spontaneous breathing and respiratory function stable Cardiovascular status: stable Postop Assessment: no apparent nausea or vomiting Anesthetic complications: no   No complications documented.  Last Vitals:  Vitals:   05/10/20 1117 05/10/20 1118  BP: (!) 145/80 130/81  Pulse: (!) 118 (!) 117  Resp: 20 20  Temp: 36.4 C 36.4 C  SpO2: 92% 93%    Last Pain:  Vitals:   05/10/20 1118  TempSrc: Oral  PainSc:    Pain Goal: Patients Stated Pain Goal: 2 (05/08/20 2000)                 Heather Roberts DANIEL

## 2020-05-10 NOTE — Progress Notes (Signed)
Mobility Specialist - Progress Note   05/10/20 1338  Mobility  Activity Ambulated in room  Level of Assistance Minimal assist, patient does 75% or more  Assistive Device Front wheel walker  Distance Ambulated (ft) 50 ft (40 ft x 1, 10 ft x 1)  Mobility Response Tolerated fair  Mobility performed by Mobility specialist  $Mobility charge 1 Mobility   Pre-mobility, RA: 118 HR, 127/78 BP, 93% SpO2 During mobility, 2L O2: 142 HR, 94% SpO2 Post-mobility, RA: 120 HR, 114/84 BP, 94% SpO2  Pt min assist to stand from bed, he requires verbal cues to push off of bed rather than RW. HR in 140s during ambulation. He walked to the door and to his bed to sit, then to his recliner. No SOB w/ 2L O2 during ambulation. Pt expressed understanding to call staff when he wants to get in bed, call bell in reach.   Benjamin [name] Mobility Specialist Mobility Specialist Phone: [phone]

## 2020-05-10 NOTE — Progress Notes (Signed)
Physical Therapy Treatment Patient Details Name: Benjamin [name] MRN: [id number] DOB: [birth year] Today's Date: 05/10/2020    History of Present Illness Pt is a 60 y/o male admitted 3/18 and s/p Aorto bifemoral bypass, Re-do left femoral below knee popliteal bypass, Re-do bilateral femoral and popliteal artery exposure. 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    PT Comments    Patient progressing slowly towards PT goals. Reports feeling slightly better than yesterday but continued pain in LLE/groin. Requires Min guard assist for transfers and short distance gait training with use of RW for support. Gait distance limited due to HR up to 155 bpm max with activity; Sp02 dropping to 85% on RA. 2/4 DOE. May need 02 for mobility at this time, RN aware. Encouraged continued mobility/activity while in the hospital and sitting in chair as tolerated. Instructed pt in stretch for left heel cord and some there ex of LEs. Will follow.   Follow Up Recommendations  Home health PT     Equipment Recommendations  None recommended by PT    Recommendations for Other Services       Precautions / Restrictions Precautions Precautions: Fall;Other (comment) Precaution Comments: NGT to suction, watch HR, Sp02 Restrictions Weight Bearing Restrictions: No    Mobility  Bed Mobility Overal bed mobility: Needs Assistance Bed Mobility: Supine to Sit;Sit to Supine     Supine to sit: Supervision;HOB elevated Sit to supine: Supervision;HOB elevated   General bed mobility comments: Use of rail, no assist needed. Difficulty lifting LLE into bed.    Transfers Overall transfer level: Needs assistance Equipment used: Rolling  walker (2 wheeled) Transfers: Sit to/from Stand Sit to Stand: Min guard         General transfer comment: Min guard for safety, cues for hand placement as pt pulling up on RW to stand.  Ambulation/Gait Ambulation/Gait assistance: Min guard Gait Distance (Feet): 40 Feet Assistive device: Rolling walker (2 wheeled) Gait Pattern/deviations: Step-to pattern;Decreased stance time - left;Trunk flexed;Narrow base of support;Decreased step length - right;Decreased step length - left Gait velocity: decreased   General Gait Details: Slow, mildly unsteady step to vs hop to gait with cues to try to place left heel on ground during stance and place more weight through LLE. Heavy use of UEs. HR up to 155 bpm with activity, Sp02 dropped to 86% on RA, rebounds after rest break 1-2 minutes.   Stairs             Wheelchair Mobility    Modified Rankin (Stroke Patients Only)       Balance Overall balance assessment: Needs assistance Sitting-balance support: Feet supported;No upper extremity supported Sitting balance-Leahy Scale: Good Sitting balance - Comments: Needs assist to donn socks due to discomfort in groin and LLE   Standing balance support: During functional activity Standing balance-Leahy Scale: Poor Standing balance comment: Requires UE support in standing, reluctant to place full weight through LLE.                            Cognition Arousal/Alertness: Awake/alert Behavior During Therapy: WFL for tasks assessed/performed Overall Cognitive Status: Within Functional Limits for tasks assessed  Exercises      General Comments General comments (skin integrity, edema, etc.): HR ranging from 120-155 bpm with activity; Sp02 ranging from 85-92% on RA. 2/4 DOE.      Pertinent Vitals/Pain Pain Assessment: 0-10 Pain Score: 6  Pain Location: left leg Pain Descriptors / Indicators:  Grimacing;Guarding;Sore Pain Intervention(s): Monitored during session;Repositioned;Patient requesting pain meds-RN notified    Home Living                      Prior Function            PT Goals (current goals can now be found in the care plan section) Progress towards PT goals: Progressing toward goals (slowly)    Frequency    Min 3X/week      PT Plan Current plan remains appropriate    Co-evaluation              AM-PAC PT "6 Clicks" Mobility   Outcome Measure  Help needed turning from your back to your side while in a flat bed without using bedrails?: A Little Help needed moving from lying on your back to sitting on the side of a flat bed without using bedrails?: A Little Help needed moving to and from a bed to a chair (including a wheelchair)?: A Little Help needed standing up from a chair using your arms (e.g., wheelchair or bedside chair)?: A Little Help needed to walk in hospital room?: A Little Help needed climbing 3-5 steps with a railing? : A Lot 6 Click Score: 17    End of Session Equipment Utilized During Treatment: Gait belt Activity Tolerance: Patient limited by pain;Treatment limited secondary to medical complications (Comment) (HR/02) Patient left: in bed;with call bell/phone within reach;with bed alarm set Nurse Communication: Mobility status;Patient requests pain meds PT Visit Diagnosis: Muscle weakness (generalized) (M62.81);Difficulty in walking, not elsewhere classified (R26.2);Pain Pain - Right/Left: Left Pain - part of body: Leg     Time: 1000-1028 PT Time Calculation (min) (ACUTE ONLY): 28 min  Charges:  $Gait Training: 8-22 mins $Therapeutic Activity: 8-22 mins                     Benjamin [name], PT, DPT Acute Rehabilitation Services Pager [phone] Office [phone]       Benjamin [name] 05/10/2020, 12:30 PM

## 2020-05-11 ENCOUNTER — Ambulatory Visit: Payer: Self-pay | Admitting: Surgery

## 2020-05-11 ENCOUNTER — Encounter (HOSPITAL_COMMUNITY): Payer: Self-pay | Admitting: Surgery

## 2020-05-11 LAB — BPAM RBC
Blood Product Expiration Date: 202204212359
Blood Product Expiration Date: 202204212359
ISSUE DATE / TIME: 202203180833
ISSUE DATE / TIME: 202203180833
Unit Type and Rh: 5100
Unit Type and Rh: 5100

## 2020-05-11 LAB — CBC
HCT: 30.6 % — ABNORMAL LOW (ref 39.0–52.0)
Hemoglobin: 10.9 g/dL — ABNORMAL LOW (ref 13.0–17.0)
MCH: 26.8 pg (ref 26.0–34.0)
MCHC: 35.6 g/dL (ref 30.0–36.0)
MCV: 75.2 fL — ABNORMAL LOW (ref 80.0–100.0)
Platelets: 274 10*3/uL (ref 150–400)
RBC: 4.07 MIL/uL — ABNORMAL LOW (ref 4.22–5.81)
RDW: 18.6 % — ABNORMAL HIGH (ref 11.5–15.5)
WBC: 10.7 10*3/uL — ABNORMAL HIGH (ref 4.0–10.5)
nRBC: 0 % (ref 0.0–0.2)

## 2020-05-11 LAB — BASIC METABOLIC PANEL
Anion gap: 8 (ref 5–15)
BUN: 14 mg/dL (ref 8–23)
CO2: 24 mmol/L (ref 22–32)
Calcium: 8.6 mg/dL — ABNORMAL LOW (ref 8.9–10.3)
Chloride: 105 mmol/L (ref 98–111)
Creatinine, Ser: 0.97 mg/dL (ref 0.61–1.24)
GFR, Estimated: >60 mL/min (ref >60)
Glucose, Bld: 133 mg/dL — ABNORMAL HIGH (ref 70–99)
Potassium: 4 mmol/L (ref 3.5–5.1)
Sodium: 137 mmol/L (ref 135–145)

## 2020-05-11 LAB — TYPE AND SCREEN
ABO/RH(D): O POS
Antibody Screen: NEGATIVE
Unit division: 0
Unit division: 0

## 2020-05-11 LAB — POCT I-STAT 7, (LYTES, BLD GAS, ICA,H+H)
Acid-base deficit: 2 mmol/L (ref 0.0–2.0)
Bicarbonate: 24.8 mmol/L (ref 20.0–28.0)
Calcium, Ion: 1.23 mmol/L (ref 1.15–1.40)
HCT: 31 % — ABNORMAL LOW (ref 39.0–52.0)
Hemoglobin: 10.5 g/dL — ABNORMAL LOW (ref 13.0–17.0)
O2 Saturation: 100 %
Patient temperature: 35.6
Potassium: 4.6 mmol/L (ref 3.5–5.1)
Sodium: 137 mmol/L (ref 135–145)
TCO2: 26 mmol/L (ref 22–32)
pCO2 arterial: 49.8 mmHg — ABNORMAL HIGH (ref 32.0–48.0)
pH, Arterial: 7.298 — ABNORMAL LOW (ref 7.350–7.450)
pO2, Arterial: 204 mmHg — ABNORMAL HIGH (ref 83.0–108.0)

## 2020-05-11 LAB — SURGICAL PATHOLOGY

## 2020-05-11 NOTE — Progress Notes (Signed)
Occupational Therapy Treatment Patient Details Name: Benjamin [name] MRN: [id number] DOB: January 14, 1961 Today's Date: 05/11/2020    History of present illness Pt is a 60 y/o male admitted 3/18 and s/p Aorto bifemoral bypass, Re-do left femoral below knee popliteal bypass, Re-do bilateral femoral and popliteal artery exposure. NGT to suction 3/19. 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   OT comments  Pt progressing towards goals. Still on suction NGT, continues with decreased access to LB for ADL due to incisional pain. (please bring AE kit next session) Pt with improved tolerance for WB through LLE but still reluctant to put full weight. Recommendations remain in place and OT will continue to follow acutely. Watch O2 saturations with activity/transfers. Inconsistent pleth with earlobe reader and unable to get finger to read with pulse ox. SpO2 varied from 82-98% clinical presentation WFL - no SOB or DOE, but Pt frequently using suction (yonker) when seated (used 4L via [place] for activity).   Follow Up Recommendations  Home health OT;Supervision/Assistance - 24 hour    Equipment Recommendations  3 in 1 bedside commode    Recommendations for Other Services      Precautions / Restrictions Precautions Precautions: Fall;Other (comment) Precaution Comments: NGT to suction (OK to remove for mobility per RN), watch HR, Sp02 Restrictions Weight Bearing Restrictions: No       Mobility Bed Mobility Overal bed mobility: Needs Assistance Bed Mobility: Supine to Sit;Sit to Supine     Supine to sit: Supervision;HOB elevated     General bed mobility comments: OOB in recliner at beginning and end of session     Transfers Overall transfer level: Needs assistance Equipment used: Rolling walker (2 wheeled) Transfers: Sit to/from Stand Sit to Stand: Min guard         General transfer comment: Min guard for safety, cues for hand placement as pt pulling up on RW to stand.    Balance Overall balance assessment: Needs assistance Sitting-balance support: Feet supported;No upper extremity supported Sitting balance-Leahy Scale: Good Sitting balance - Comments: Needs assist to don socks due to discomfort in groin and LLE   Standing balance support: During functional activity Standing balance-Leahy Scale: Poor Standing balance comment: Requires UE support in standing, reluctant to place full weight through LLE.                           ADL either performed or assessed with clinical judgement   ADL Overall ADL's : Needs assistance/impaired Eating/Feeding: Set up;Sitting Eating/Feeding Details (indicate cue type and reason): feeding himself ice chips Grooming: Set up;Sitting;Wash/dry hands;Wash/dry face Grooming Details (indicate cue type and reason): in recliner             Lower Body Dressing: Maximal assistance;Sitting/lateral leans Lower Body Dressing Details (indicate cue type and reason): Pt unable to bend knee due to pain to perform figure 4 - reports that his sister will help him Toilet Transfer: Min guard;BSC;RW           Functional mobility during ADLs: Min guard;Rolling walker;Cueing for safety       Vision       Perception     Praxis      Cognition Arousal/Alertness: Awake/alert Behavior During Therapy: WFL for tasks assessed/performed Overall Cognitive Status: No family/caregiver present  to determine baseline cognitive functioning Area of Impairment: Following commands;Safety/judgement;Problem solving                       Following Commands: Follows one step commands consistently;Follows multi-step commands  inconsistently Safety/Judgement: Decreased awareness of safety   Problem Solving: Difficulty sequencing;Requires verbal cues General Comments: vc for safety with RW, increased ability to WB on LLE this session from previous OT session        Exercises Exercises: General Lower Extremity General Exercises - Lower Extremity Ankle Circles/Pumps: AROM;Both;10 reps;Supine Short Arc Quad: AROM;Both;10 reps;Supine Hip ABduction/ADduction: AROM;Both;10 reps;Supine   Shoulder Instructions       General Comments recently worked with PT    Pertinent Vitals/ Pain       Pain Assessment: Faces Pain Score: 9  Faces Pain Scale: Hurts even more Pain Location: left leg Pain Descriptors / Indicators: Grimacing;Guarding;Sore Pain Intervention(s): Monitored during session;Repositioned;Limited activity within patient's tolerance  Home Living                                          Prior Functioning/Environment              Frequency  Min 2X/week        Progress Toward Goals  OT Goals(current goals can now be found in the care plan section)  Progress towards OT goals: Progressing toward goals  Acute Rehab OT Goals Patient Stated Goal: to get the NGT out of his nose OT Goal Formulation: With patient Time For Goal Achievement: 05/23/20 Potential to Achieve Goals: Good  Plan Discharge plan remains appropriate;Frequency remains appropriate    Co-evaluation                 AM-PAC OT "6 Clicks" Daily Activity     Outcome Measure   Help from another person eating meals?: None Help from another person taking care of personal grooming?: A Little Help from another person toileting, which includes using toliet, bedpan, or urinal?: A Little Help from another person bathing (including washing, rinsing, drying)?: A Lot Help from another person to put on and taking off regular upper body clothing?: A Little Help from another person to put on and taking off  regular lower body clothing?: A Lot 6 Click Score: 17    End of Session Equipment Utilized During Treatment: Gait belt;Rolling walker  OT Visit Diagnosis: Other abnormalities of gait and mobility (R26.89);Pain;Unsteadiness on feet (R26.81) Pain - Right/Left: Left Pain - part of body: Leg   Activity Tolerance Patient tolerated treatment well   Patient Left in bed;with bed alarm set;with call bell/phone within reach   Nurse Communication Mobility status;Precautions;Weight bearing status        Time: 8280-0349 OT Time Calculation (min): 25 min  Charges: OT General Charges $OT Visit: 1 Visit OT Treatments $Self Care/Home Management : 23-37 mins  Jesse Sans OTR/L Acute Rehabilitation Services Pager: [phone] Office: [place] 05/11/2020, 3:40 PM

## 2020-05-11 NOTE — Progress Notes (Signed)
Physical Therapy Treatment Patient Details Name: Benjamin [name] MRN: [id number] DOB: Feb 22, 1961 Today's Date: 05/11/2020    History of Present Illness Pt is a 60 y/o male admitted 3/18 and s/p Aorto bifemoral bypass, Re-do left femoral below knee popliteal bypass, Re-do bilateral femoral and popliteal artery exposure. NGT to suction 3/19. 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    PT Comments    Pt received in supine, agreeable to therapy session and with good participation, continues to c/o severe LLE pain with mobility and unable to put weight through his L heel during household distance gait trials with RW. Of note, pt SpO2 desat on RA and on 2-4L O2 [place] during mobility even with cues for deep breathing but once he has seated break and uses suction on mouth, SpO2 improves to >92% within 1 minute. Pt up in chair with alarm on and BLE elevated/reclined at end of session, NGT intact and VSS on RA at rest. Pt continues to benefit from PT services to progress toward functional mobility goals. Continue to recommend HHPT pending progress, anticipate pt may have supplemental O2 needs for mobility.   Follow Up Recommendations  Home health PT     Equipment Recommendations  None recommended by PT    Recommendations for Other Services       Precautions / Restrictions Precautions Precautions: Fall;Other (comment) Precaution Comments: NGT to suction (OK to remove for mobility per RN), watch HR, Sp02 Restrictions Weight Bearing Restrictions: No    Mobility  Bed Mobility Overal bed mobility: Needs Assistance Bed Mobility: Supine to Sit;Sit to Supine     Supine to sit: Supervision;HOB elevated     General bed mobility  comments: Use of rail and HOB elevated; no physical assist needed.    Transfers Overall transfer level: Needs assistance Equipment used: Rolling walker (2 wheeled) Transfers: Sit to/from Stand Sit to Stand: Min guard         General transfer comment: Min guard for safety, cues for hand placement as pt pulling up on RW to stand.  Ambulation/Gait Ambulation/Gait assistance: Min guard Gait Distance (Feet): 40 Feet (6ft, seated break, 80ft) Assistive device: Rolling walker (2 wheeled) Gait Pattern/deviations: Step-to pattern;Decreased stance time - left;Trunk flexed;Narrow base of support;Decreased step length - right;Decreased step length - left;Decreased dorsiflexion - left Gait velocity: decreased   General Gait Details: Slow, mildly unsteady step to vs hop to gait with cues to try to place left heel on ground during stance and place more weight through LLE. Pt tending to use L forefoot when cued not to hop. Heavy use of UEs. HR up to 141 bpm with activity, Sp02 dropped to 79% on RA, rebounds after rest break 1-2 minutes, then dest at 83% on 4L O2 [place] and improves within 1 min seated rest and use of mouth suction, RN notified   Stairs             Wheelchair Mobility    Modified Rankin (Stroke Patients Only)       Balance Overall balance assessment: Needs assistance Sitting-balance support: Feet supported;No upper extremity supported Sitting balance-Leahy Scale: Good Sitting balance - Comments: Needs assist to don socks due to discomfort in groin and LLE   Standing balance support: During functional activity Standing balance-Leahy Scale: Poor Standing balance comment: Requires UE support  in standing, reluctant to place full weight through LLE.                            Cognition Arousal/Alertness: Awake/alert Behavior During Therapy: WFL for tasks assessed/performed Overall Cognitive Status: Within Functional Limits for tasks assessed                                  General Comments: pt inconsistent with report of pain in LLE, at times reporting at incision, then stating just big toe, then heel. Pt with slow processing at times but good participation and 1-step command following, needs safety cues for transfers.      Exercises General Exercises - Lower Extremity Ankle Circles/Pumps: AROM;Both;10 reps;Supine Short Arc Quad: AROM;Both;10 reps;Supine Hip ABduction/ADduction: AROM;Both;10 reps;Supine    General Comments General comments (skin integrity, edema, etc.): see gait comments for SpO2, desat with mobility; BP 133/63 (85) seated EOB and SpO2 93% seated on RA      Pertinent Vitals/Pain Pain Assessment: 0-10 Pain Score: 9  Pain Location: left leg Pain Descriptors / Indicators: Grimacing;Guarding;Sore Pain Intervention(s): Monitored during session;Repositioned;Patient requesting pain meds-RN notified (reclined with pillow under BLE at end of session)    Home Living                      Prior Function            PT Goals (current goals can now be found in the care plan section) Acute Rehab PT Goals Patient Stated Goal: to get the NGT out of his nose PT Goal Formulation: With patient Time For Goal Achievement: 05/22/20 Potential to Achieve Goals: Good Progress towards PT goals: Progressing toward goals    Frequency    Min 3X/week      PT Plan Current plan remains appropriate    Co-evaluation              AM-PAC PT "6 Clicks" Mobility   Outcome Measure  Help needed turning from your back to your side while in a flat bed without using bedrails?: A Little Help needed moving from lying on your back to sitting on the side of a flat bed without using bedrails?: A Little Help needed moving to and from a bed to a chair (including a wheelchair)?: A Little Help needed standing up from a chair using your arms (e.g., wheelchair or bedside chair)?: A Little Help needed to walk in hospital room?:  A Little Help needed climbing 3-5 steps with a railing? : A Lot 6 Click Score: 17    End of Session Equipment Utilized During Treatment: Gait belt;Oxygen (O2 during gait, OK on RA at rest) Activity Tolerance: Patient tolerated treatment well Patient left: in chair;with call bell/phone within reach;with chair alarm set (NGT to suction) Nurse Communication: Mobility status;Patient requests pain meds PT Visit Diagnosis: Muscle weakness (generalized) (M62.81);Difficulty in walking, not elsewhere classified (R26.2);Pain Pain - Right/Left: Left Pain - part of body: Leg     Time: 1224-1300 PT Time Calculation (min) (ACUTE ONLY): 36 min  Charges:  $Gait Training: 8-22 mins $Therapeutic Exercise: 8-22 mins                     Kazuko Clemence P., PTA Acute Rehabilitation Services Pager: [phone] Office: [phone]   Angus Palms 05/11/2020, 1:19 PM

## 2020-05-11 NOTE — Progress Notes (Addendum)
Progress Note    05/11/2020 7:22 AM 3 Days Post-Op  Subjective: no major complaints. States he is very thirsty and wants some OJ. No flatus or BM   Vitals:   05/11/20 0636 05/11/20 0643  BP: 122/80 117/80  Pulse: 91 (!) 116  Resp: (!) 25 20  Temp: (!) 100.6 F (38.1 C) (!) 100.6 F (38.1 C)  SpO2: 95%    Physical Exam: Cardiac: regular Lungs: non labored Incisions: laparotomy and bilateral groin incisions intact. Right groin dressings clean, dry and intact. Left BK pop incision clean dry and intact.  Extremities: Well perfused and warm. Doppler signals left PT/ DP right PT/ AT. Motor and sensation intact Abdomen: flat, soft. Diminished bowel sounds. Incision with expected tenderness Neurologic: alert and oriented  CBC    Component Value Date/Time   WBC 12.2 (H) 05/09/2020 0330   RBC 4.48 05/09/2020 0330   HGB 12.0 (L) 05/09/2020 0330   HGB 14.9 05/15/2019 1117   HCT 33.8 (L) 05/09/2020 0330   HCT 41.4 05/15/2019 1117   PLT 360 05/09/2020 0330   PLT 262 05/15/2019 1117   MCV 75.4 (L) 05/09/2020 0330   MCV 86 05/15/2019 1117   MCH 26.8 05/09/2020 0330   MCHC 35.5 05/09/2020 0330   RDW 18.3 (H) 05/09/2020 0330   RDW 13.9 05/15/2019 1117   LYMPHSABS 1,360 06/24/2016 1024   MONOABS 480 06/24/2016 1024   EOSABS 80 06/24/2016 1024   BASOSABS 0 06/24/2016 1024    BMET    Component Value Date/Time   NA 135 05/09/2020 0330   NA 140 10/01/2019 1041   K 4.3 05/09/2020 0330   CL 103 05/09/2020 0330   CO2 23 05/09/2020 0330   GLUCOSE 137 (H) 05/09/2020 0330   BUN 15 05/09/2020 0330   BUN 11 10/01/2019 1041   CREATININE 0.94 05/09/2020 0330   CREATININE 1.02 01/20/2017 0908   CALCIUM 8.8 (L) 05/09/2020 0330   GFRNONAA >60 05/09/2020 0330   GFRAA >60 11/14/2019 0508    INR    Component Value Date/Time   INR 1.2 05/08/2020 1450     Intake/Output Summary (Last 24 hours) at 05/11/2020 0865 Last data filed at 05/11/2020 0640 Gross per 24 hour  Intake 3507.07  ml  Output 2825 ml  Net 682.07 ml     Assessment/Plan:  60 y.o. male is s/p ABF left fem pop bypass 3 Days Post-Op.   Bilateral groin incisions intact. Right groin with some drainage over night. Dressings c/d/i. laparotomy incision intact and looks good. Bilateral lower extremities well perfused and warm. Doppler signals left PT/ DP right PT/ AT. Decreased BS, no flatus of BM yet. NPO Ice chips okay NG with 325 cc output overnight. Possibly D/C later today Good UOP. D/C foley Low grade fever Check labs this morning Encourage IS Encourage mobilization Elevate BLE when in bed  DVT prophylaxis:  Sq heparin   Graceann Congress, PA-C [facility] [phone] 05/11/2020 7:22 AM   I agree with the above.  I seen evaluate the patient.  He is postop day 3 from an aortobifemoral bypass graft and redo left femoral below-knee popliteal bypass graft.  He has brisk pedal Doppler signals on the left.  His incisions are healing nicely.  DC Foley today Labs from this morning look good The patient states that he had flatus yesterday.  NG volume is a little on the high side however I am going to remove it today.  He needs to get out of bed  and walk today  [employer]

## 2020-05-12 MED FILL — Sodium Chloride IV Soln 0.9%: INTRAVENOUS | Qty: 1000 | Status: AC

## 2020-05-12 MED FILL — Heparin Sodium (Porcine) Inj 1000 Unit/ML: INTRAMUSCULAR | Qty: 30 | Status: AC

## 2020-05-12 NOTE — Progress Notes (Signed)
  Progress Note    05/12/2020 7:41 AM 4 Days Post-Op  Subjective:  Pain L lateral foot ulcer   Vitals:   05/12/20 0000 05/12/20 0339  BP: 121/75 113/73  Pulse: 86 86  Resp: 19 20  Temp: 98.6 F (37 C) 97.7 F (36.5 C)  SpO2: 98% 98%   Physical Exam: Lungs:  Non labored Incisions:  abd incision c/d/i; groin incisions with some serous drainage; L popliteal incision c/d/i Extremities:  R DP/PT by doppler; L peroneal/PT by doppler Abdomen:  Soft, non-tender, non-distended Neurologic: A&O  CBC    Component Value Date/Time   WBC 10.7 (H) 05/11/2020 0754   RBC 4.07 (L) 05/11/2020 0754   HGB 10.9 (L) 05/11/2020 0754   HGB 14.9 05/15/2019 1117   HCT 30.6 (L) 05/11/2020 0754   HCT 41.4 05/15/2019 1117   PLT 274 05/11/2020 0754   PLT 262 05/15/2019 1117   MCV 75.2 (L) 05/11/2020 0754   MCV 86 05/15/2019 1117   MCH 26.8 05/11/2020 0754   MCHC 35.6 05/11/2020 0754   RDW 18.6 (H) 05/11/2020 0754   RDW 13.9 05/15/2019 1117   LYMPHSABS 1,360 06/24/2016 1024   MONOABS 480 06/24/2016 1024   EOSABS 80 06/24/2016 1024   BASOSABS 0 06/24/2016 1024    BMET    Component Value Date/Time   NA 137 05/11/2020 0754   NA 140 10/01/2019 1041   K 4.0 05/11/2020 0754   CL 105 05/11/2020 0754   CO2 24 05/11/2020 0754   GLUCOSE 133 (H) 05/11/2020 0754   BUN 14 05/11/2020 0754   BUN 11 10/01/2019 1041   CREATININE 0.97 05/11/2020 0754   CREATININE 1.02 01/20/2017 0908   CALCIUM 8.6 (L) 05/11/2020 0754   GFRNONAA >60 05/11/2020 0754   GFRAA >60 11/14/2019 0508    INR    Component Value Date/Time   INR 1.2 05/08/2020 1450     Intake/Output Summary (Last 24 hours) at 05/12/2020 0741 Last data filed at 05/12/2020 0700 Gross per 24 hour  Intake --  Output 1400 ml  Net -1400 ml     Assessment/Plan:  60 y.o. male is s/p ABF and redo L fem pop with PTFE 4 Days Post-Op   BLE well perfused by doppler exam Passing flatus; no BM yesterday; no n/v; continue clear  fluids Encouraged OOB today Home when mobility improved and tolerating a regular diet   Emilie Rutter, PA-C [facility] [phone] 05/12/2020 7:41 AM

## 2020-05-12 NOTE — Progress Notes (Signed)
Physical Therapy Treatment Patient Details Name: Benjamin [name] MRN: [id number] DOB: April 09, 1960 Today's Date: 05/12/2020    History of Present Illness Pt is a 60 y/o male admitted 3/18 and s/p Aorto bifemoral bypass, Re-do left femoral below knee popliteal bypass, Re-do bilateral femoral and popliteal artery exposure. NGT to suction 3/19. 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    PT Comments    Pt seen for PT session, received in chair and motivated to progress functional mobility. He was able to progress gait to household distances using RW and min guard assist, however SpO2 desat on RA with exertion to 84%, needed 2L O2 [place] to remain >92% during gait trial. Pt with improved pain tolerance and 2/4 DOE during mobility. Pt continues to benefit from PT services to progress toward functional mobility goals. Continue to recommend HHPT.   Follow Up Recommendations  Home health PT     Equipment Recommendations  None recommended by PT (he may have supplemental O2 needs)    Recommendations for Other Services       Precautions / Restrictions Precautions Precautions: Fall Precaution Comments: watch SpO2/HR Restrictions Weight Bearing Restrictions: No    Mobility  Bed Mobility  General bed mobility comments: OOB in recliner at beginning and end of session    Transfers Overall transfer level: Needs assistance Equipment used: Rolling walker (2 wheeled) Transfers: Sit to/from Stand Sit to Stand: Supervision  General transfer comment: from chair, fair safety awareness, needs UE to achieve standing  Ambulation/Gait Ambulation/Gait assistance: Min guard;Supervision Gait Distance (Feet): 150 Feet (43ft, 183ft with  seated break) Assistive device: Rolling walker (2 wheeled) Gait Pattern/deviations: Step-through pattern;Decreased stance time - left;Decreased stride length (downward gaze) Gait velocity: decreased   General Gait Details: improved weight bearing through LLE and heel this date, still mildly antalgic and continues to use BUE heavily to offload; SpO2 desat on RA to 84% and on 1L O2 [place] to 87%, needs 2L O2 [place] for SpO2 >92%; needs cues for standing break and pursed lip breathing due to DOE 2/4 at times.     Balance Overall balance assessment: Needs assistance Sitting-balance support: Feet supported;No upper extremity supported Sitting balance-Leahy Scale: Good     Standing balance support: During functional activity Standing balance-Leahy Scale: Poor Standing balance comment: Requires UE support in standing, reluctant to place full weight through LLE.             Cognition Arousal/Alertness: Awake/alert Behavior During Therapy: WFL for tasks assessed/performed Overall Cognitive Status: No family/caregiver present to determine baseline cognitive functioning Area of Impairment: Following commands;Safety/judgement;Problem solving          Following Commands: Follows one step commands consistently;Follows multi-step commands inconsistently Safety/Judgement: Decreased awareness of safety   Problem Solving: Difficulty sequencing;Requires verbal cues General Comments: good command following; pt anxious regarding continued need for O2, possibly to need at home      Exercises      General Comments General comments (skin integrity, edema, etc.): pt requesting pain meds due to incisional pain (pointing to top of abdomen); RN notified his IV tape coming off on dorsum of R hand during session and entering room to remove IV at end of session      Pertinent Vitals/Pain Pain Assessment: 0-10 Faces Pain Scale: Hurts even more Pain Location: left leg Pain  Descriptors / Indicators:  Sore Pain Intervention(s): Limited activity within patient's tolerance;Monitored during session;Repositioned;Patient requesting pain meds-RN notified (RW lowered for improved UE use to offload LLE)    Home Living   Prior Function    PT Goals (current goals can now be found in the care plan section) Acute Rehab PT Goals Patient Stated Goal: to get walking better and not need oxygen PT Goal Formulation: With patient Time For Goal Achievement: 05/22/20 Potential to Achieve Goals: Good Progress towards PT goals: Progressing toward goals    Frequency    Min 3X/week      PT Plan Current plan remains appropriate    Co-evaluation              AM-PAC PT "6 Clicks" Mobility   Outcome Measure  Help needed turning from your back to your side while in a flat bed without using bedrails?: A Little Help needed moving from lying on your back to sitting on the side of a flat bed without using bedrails?: A Little Help needed moving to and from a bed to a chair (including a wheelchair)?: A Little Help needed standing up from a chair using your arms (e.g., wheelchair or bedside chair)?: A Little Help needed to walk in hospital room?: A Little Help needed climbing 3-5 steps with a railing? : A Lot 6 Click Score: 17    End of Session Equipment Utilized During Treatment: Gait belt;Oxygen (O2 during gait, on RA at rest and sats WNL) Activity Tolerance: Patient tolerated treatment well Patient left: in chair;with call bell/phone within reach;with chair alarm set Nurse Communication: Mobility status;Patient requests pain meds (for abdominal incision) PT Visit Diagnosis: Muscle weakness (generalized) (M62.81);Difficulty in walking, not elsewhere classified (R26.2);Pain Pain - Right/Left: Left Pain - part of body: Leg (and abdominal incision)     Time: 3299-2426 PT Time Calculation (min) (ACUTE ONLY): 28 min  Charges:  $Gait Training: 23-37 mins                     Duanne Duchesne P., PTA Acute  Rehabilitation Services Pager: [phone] Office: [phone]   Angus Palms 05/12/2020, 12:14 PM

## 2020-05-12 NOTE — TOC Initial Note (Signed)
Transition of Care (TOC) - Initial/Assessment Note  Donn Pierini RN, BSN Transitions of Care Unit 4E- RN Case Manager See Treatment Team for direct phone #    Patient Details  Name: Benjamin [name] MRN: [id number] Date of Birth: 09/26/1960  Transition of Care [facility]) CM/SW Contact:    Darrold Span, RN Phone Number: 05/12/2020, 1:41 PM  Clinical Narrative:                 Pt s/p aortobifemoral bypass, tolerating clear diet today. Orders for HHPT/OT placed- CM in to speak with pt at bedside. Per conversation pt confirmed address, phone # and PCP in epic. Pt also states that FC have been by to see him during this admit and assisted him with filing for Medicaid.  Pt reports that he has walker and w/c at home, states he would like to have 3n1 and per therapy notes this has been recommended. Also discussed 02 has been dropping when he is working with PT- discussed pt working more on his IS during day and deep breathing. On review of chart notes do not see a qualifying dx for home 02.   Discussed with pt HHPT/OT orders, as pt is un-insured will need to refer to one of the [facility] agencies that work with [facility] under charity programs to see if pt will qualify for [facility] services. Explained this process to pt and patient is agreeable to referral for [facility]. Call made to [facility] agency for this week) regarding HHPT/OT needs - they will review and see if pt qualifies for [facility] needs.   DME order for 3n1 placed- will call [employer] prior to discharge to see if pt will qualify for DME.   Expected Discharge Plan: Home w Home Health Services Barriers to Discharge: Continued Medical Work up   Patient Goals and CMS Choice Patient states their goals for this hospitalization and ongoing recovery are:: be able to walk down the street, would like to return work if able   Choice offered to / list presented to : NA [facility] referral)  Expected Discharge Plan and Services Expected Discharge Plan: Home w Home Health  Services   Discharge Planning Services: CM Consult Post Acute Care Choice: Home Health,Durable Medical Equipment Living arrangements for the past 2 months: Single Family Home                 DME Arranged: 3-N-1 DME Agency: [facility]       HH Arranged: PT,OT HH Agency: [facility] Date [facility] Agency Contacted: 05/12/20 Time HH Agency Contacted: 1340 Representative spoke with at [facility] Agency: [name]  Prior Living Arrangements/Services Living arrangements for the past 2 months: Single Family Home Lives with:: Self Patient language and need for interpreter reviewed:: Yes Do you feel safe going back to the place where you live?: Yes      Need for Family Participation in Patient Care: Yes (Comment) Care giver support system in place?: Yes (comment) Current home services: DME (RW, W/C) Criminal Activity/Legal Involvement Pertinent to Current Situation/Hospitalization: No - Comment as needed  Activities of Daily Living Home Assistive Devices/Equipment: None ADL Screening (condition at time of admission) Patient's cognitive ability adequate to safely complete daily activities?: Yes Is the patient deaf or have difficulty hearing?: No Does the patient have difficulty seeing, even when wearing glasses/contacts?: No Does the patient have difficulty concentrating, remembering, or making decisions?: No Patient able to express need for assistance with ADLs?: Yes Does the patient have difficulty dressing or  bathing?: No Independently performs ADLs?: Yes (appropriate for developmental age) Does the patient have difficulty walking or climbing stairs?: No Weakness of Legs: None Weakness of Arms/Hands: None  Permission Sought/Granted Permission sought to share information with : Oceanographer granted to share information with : Yes, Verbal Permission Granted     Permission granted to share info w AGENCY: HH and DME        Emotional  Assessment Appearance:: Appears stated age Attitude/Demeanor/Rapport: Engaged Affect (typically observed): Appropriate Orientation: : Oriented to Self,Oriented to Place,Oriented to  Time,Oriented to Situation Alcohol / Substance Use: Tobacco Use Psych Involvement: No (comment)  Admission diagnosis:  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 Femoral-popliteal bypass graft occlusion, 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  . PAD (peripheral artery disease) (HCC) 11/13/2019  . Callus of foot 07/01/2019  . Intermittent claudication (HCC) 01/20/2017  . Need for influenza vaccination 01/20/2017  . Needs smoking cessation education 09/06/2016  . Abnormal echocardiogram 09/05/2016  . Abnormal EKG 07/25/2016  . Preoperative cardiovascular examination 07/25/2016  . Shortness of breath on exertion 07/25/2016  . Dyslipidemia, goal LDL below 70 07/25/2016  . Uncontrolled hypertension 07/25/2016  . Abnormal chest x-ray 07/04/2016  . Peripheral vascular disease (HCC) 07/04/2016  . Decreased pedal pulses 06/24/2016  . Varicose vein of leg 06/24/2016  . Heavy smoker 06/24/2016  . Abnormal weight loss 06/24/2016  . Foot pain, left 06/24/2016  . Screening for prostate cancer 06/24/2016   PCP:  Arnette Felts, FNP Pharmacy:   [id number] - [place], [place] - [address] AT [facility] OF [address] & [address] [place] [address] Phone: [phone] Fax: [phone]  [facility] - [place], [place] - [address] [place] [address] Phone: [phone] Fax: [phone]     Social Determinants of Health (SDOH) Interventions    Readmission Risk Interventions No flowsheet data found.

## 2020-05-12 NOTE — Progress Notes (Signed)
Mobility Specialist - Progress Note   05/12/20 1422  Mobility  Activity Ambulated in hall  Level of Assistance Standby assist, set-up cues, supervision of patient - no hands on  Assistive Device Front wheel walker  Distance Ambulated (ft) 130 ft  Mobility Response Tolerated well  Mobility performed by Mobility specialist  $Mobility charge 1 Mobility   Pre-mobility, RA: 96 HR, 98% SpO2 During mobility, 2L O2: 117 HR Post-mobility, RA: 87 HR  Unable to get reliable signal on pulse ox during and after ambulation. He c/o intermittent R LE tightness, otherwise asx. No c/o dyspnea/lightheadedness. Pt back in bed after walk.   Mamie Levers Mobility Specialist Mobility Specialist Phone: [phone]

## 2020-05-13 MED ORDER — ALUM & MAG HYDROXIDE-SIMETH 200-200-20 MG/5ML PO SUSP: 15.0000 mL | ORAL | Status: DC | PRN

## 2020-05-13 MED ORDER — PANTOPRAZOLE SODIUM 40 MG PO TBEC
40.0000 mg | DELAYED_RELEASE_TABLET | Freq: Every day | ORAL | Status: DC
  Administered 2020-05-13 – 2020-05-15 (×3): 40 mg via ORAL
  Filled 2020-05-13 (×3): qty 1

## 2020-05-13 MED ORDER — ACETAMINOPHEN 325 MG PO TABS
325.0000 mg | ORAL_TABLET | ORAL | Status: DC | PRN
  Administered 2020-05-13: 650 mg via ORAL

## 2020-05-13 MED ORDER — POTASSIUM CHLORIDE 20 MEQ PO PACK
20.0000 meq | PACK | Freq: Every day | ORAL | Status: DC | PRN
Start: 2020-05-13 — End: 2020-05-16

## 2020-05-13 MED ORDER — DOCUSATE SODIUM 50 MG/5ML PO LIQD
100.0000 mg | Freq: Every day | ORAL | Status: DC
Start: 2020-05-14 — End: 2020-05-16
  Administered 2020-05-14 – 2020-05-16 (×3): 100 mg via ORAL
  Filled 2020-05-13 (×3): qty 10

## 2020-05-13 MED ORDER — OXYCODONE-ACETAMINOPHEN 5-325 MG PO TABS
1.0000 | ORAL_TABLET | ORAL | Status: DC | PRN
  Administered 2020-05-13 – 2020-05-16 (×8): 2 via ORAL
  Filled 2020-05-13 (×9): qty 2

## 2020-05-13 MED ORDER — SENNOSIDES-DOCUSATE SODIUM 8.6-50 MG PO TABS
1.0000 | ORAL_TABLET | Freq: Every evening | ORAL | Status: DC | PRN
  Administered 2020-05-15: 1 via ORAL
  Filled 2020-05-13: qty 1

## 2020-05-13 MED ORDER — ACETAMINOPHEN 325 MG RE SUPP
325.0000 mg | RECTAL | Status: DC | PRN
  Filled 2020-05-13: qty 2

## 2020-05-13 NOTE — Progress Notes (Signed)
Occupational Therapy Treatment Patient Details Name: Benjamin [name] MRN: [id number] DOB: Jul 08, 1960 Today's Date: 05/13/2020    History of present illness Pt is a 59 y/o male admitted 3/18 and s/p Aorto bifemoral bypass, Re-do left femoral below knee popliteal bypass, Re-do bilateral femoral and popliteal artery exposure. NGT to suction 3/19. 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   OT comments  Pt making steady progress towards OT goals this session. Session focus on LB ADLs and functional mobility as precursor to higher level BADLs. Pt continues to present with increased pain and impaired balance. Education provided on all LB AE for LB ADLs however pt demonstrating improvements with reaching LB this session. Pt completed functional mobility with RW greater than a household distance with min guard assist, pt able to WB more on LLE this session. Pt on RA during session, very difficult to obtain O2 reading during ambulation poor pleth with readings ranging from 89- 100% on RA. Left pt on RA with new O2 sensor applied with O2 reading 96% at end of session. Pt would continue to benefit from skilled occupational therapy while admitted and after d/c to address the below listed limitations in order to improve overall functional mobility and facilitate independence with BADL participation. DC plan remains appropriate, will follow acutely per POC.     Follow Up Recommendations  Home health OT;Supervision/Assistance - 24 hour    Equipment Recommendations  3 in 1 bedside commode    Recommendations for Other Services      Precautions / Restrictions Precautions Precautions: Fall Precaution Comments: watch  SpO2/HR Restrictions Weight Bearing Restrictions: No       Mobility Bed Mobility Overal bed mobility: Needs Assistance Bed Mobility: Supine to Sit;Sit to Supine     Supine to sit: Modified independent (Device/Increase time) Sit to supine: Modified independent (Device/Increase time)   General bed mobility comments: no physical assist needed    Transfers Overall transfer level: Needs assistance Equipment used: Rolling walker (2 wheeled) Transfers: Sit to/from Stand Sit to Stand: Supervision;From elevated surface         General transfer comment: supervision for sit<>stand from heavily elevated EOB to simulated home environment    Balance Overall balance assessment: Needs assistance Sitting-balance support: Feet supported;No upper extremity supported Sitting balance-Leahy Scale: Good Sitting balance - Comments: able to reach out of BOS to don socks from EOB with no LOB   Standing balance support: Bilateral upper extremity supported;During functional activity Standing balance-Leahy Scale: Poor Standing balance comment: reliant on BUE support during mobility                           ADL either performed or assessed with clinical judgement   ADL Overall ADL's : Needs assistance/impaired               Lower Body Bathing Details (indicate cue type and reason): education on AE for LB bathing     Lower Body Dressing: Set up;Supervision/safety;Sitting/lateral leans Lower Body Dressing Details (indicate cue type and reason): pt able to don sock via with supervision- education provided on all LB AE if needed at home Toilet Transfer: Min IT sales professional Details (indicate cue type and reason): simulated via functional mobility with RW and MIN guard assist  Functional mobility during ADLs: Min guard;Rolling walker;Cueing for safety General ADL Comments: pt with improvement in reaching LB for ADLs, education and LB AE provided to pt  with pt verbalizing understanding. pt completing functional mobility greater than a household distance with Rw and cues for safety wth min guarda assist     Vision       Perception     Praxis      Cognition Arousal/Alertness: Awake/alert Behavior During Therapy: WFL for tasks assessed/performed Overall Cognitive Status: No family/caregiver present to determine baseline cognitive functioning Area of Impairment: Following commands;Safety/judgement;Problem solving                       Following Commands: Follows one step commands consistently;Follows multi-step commands with increased time Safety/Judgement: Decreased awareness of safety;Decreased awareness of deficits (extensive education provided on impaired O2 saturations and effect on body)   Problem Solving: Difficulty sequencing;Requires verbal cues;Slow processing;Decreased initiation General Comments: pt following commmands consistently but needs increased time to follow multi step commands, pt overall slow to process needing repetition of education for full understanding. extensive time spent explaining meaning of low O2 saturations and effect of dyspnea on pts body        Exercises     Shoulder Instructions       General Comments pt reports concer with using RW at his sisters house as she has thick carpet, pt on RA during session, very difficult to obtain O2 reading during ambulation poor pleth with reading ranging from 89- 100% on RA. left pt on RA with new O2 sensor applied with O2 reading 96% at end of session    Pertinent Vitals/ Pain       Pain Assessment: Faces Faces Pain Scale: Hurts little more Pain Location: LLE Pain Descriptors / Indicators: Aching;Sore Pain Intervention(s): Monitored during session;Repositioned  Home Living                                          Prior Functioning/Environment              Frequency  Min 2X/week        Progress Toward Goals  OT  Goals(current goals can now be found in the care plan section)  Progress towards OT goals: Progressing toward goals  Acute Rehab OT Goals Patient Stated Goal: to go home OT Goal Formulation: With patient Time For Goal Achievement: 05/23/20 Potential to Achieve Goals: Good  Plan Discharge plan remains appropriate;Frequency remains appropriate    Co-evaluation                 AM-PAC OT "6 Clicks" Daily Activity     Outcome Measure   Help from another person eating meals?: None Help from another person taking care of personal grooming?: A Little Help from another person toileting, which includes using toliet, bedpan, or urinal?: A Little Help from another person bathing (including washing, rinsing, drying)?: A Little Help from another person to put on and taking off regular upper body clothing?: None Help from another person to put on and taking off regular lower body clothing?: A Little 6 Click Score: 20    End of Session Equipment Utilized During Treatment: Gait belt;Rolling walker  OT Visit Diagnosis: Other abnormalities of gait and mobility (R26.89);Pain;Unsteadiness on feet (R26.81) Pain - Right/Left: Left Pain - part of body: Leg   Activity Tolerance Patient  tolerated treatment well   Patient Left in bed;with call bell/phone within reach;with bed alarm set   Nurse Communication Mobility status;Other (comment) (difficult to obtain O2 reading, applied new pulse ox sensory to improve accuracy)        Time: 3151-7616 OT Time Calculation (min): 24 min  Charges: OT General Charges $OT Visit: 1 Visit OT Treatments $Self Care/Home Management : 23-37 mins  Lenor Derrick., COTA/L Acute Rehabilitation Services [phone]    Barron Schmid 05/13/2020, 11:32 AM

## 2020-05-13 NOTE — Progress Notes (Signed)
Mobility Specialist: Progress Note   05/13/20 1549  Mobility  Activity Ambulated in hall  Level of Assistance Contact guard assist, steadying assist  Assistive Device Front wheel walker  Distance Ambulated (ft) 170 ft  Mobility Response Tolerated well  Mobility performed by Mobility specialist  $Mobility charge 1 Mobility   Post-Mobility: 77 HR, 133/84 BP, 97% SpO2  Pt c/o tightness in L foot/ankle during ambulation. Pt otherwise asx. Was unable to get accurate SpO2 reading during walk, RN notified.   [facility] Benjamin [name] Mobility Specialist Mobility Specialist Phone: [phone]

## 2020-05-13 NOTE — Progress Notes (Addendum)
  Progress Note    05/13/2020 7:39 AM 5 Days Post-Op  Subjective:  Sitting up in chair. No major complaints. Hopeful to advance diet. Says he feels like he needs to have BM this morning   Vitals:   05/12/20 2332 05/13/20 0318  BP: 124/77 111/78  Pulse: 93 89  Resp: 20 14  Temp: 97.8 F (36.6 C) 97.7 F (36.5 C)  SpO2: 96% 96%   Physical Exam: Cardiac: regular Lungs: non labored Incisions:  Laparotomy incision clean, dry and intact. Left groin mild serous drainage, intact. Left BK pop incision clean, dry and intact Extremities: well perfused and warm with right Dp/ PT signals, left pero, PT and fait AT signal Abdomen: soft, non tender, non distended Neurologic: alert and oriented  CBC    Component Value Date/Time   WBC 10.7 (H) 05/11/2020 0754   RBC 4.07 (L) 05/11/2020 0754   HGB 10.9 (L) 05/11/2020 0754   HGB 14.9 05/15/2019 1117   HCT 30.6 (L) 05/11/2020 0754   HCT 41.4 05/15/2019 1117   PLT 274 05/11/2020 0754   PLT 262 05/15/2019 1117   MCV 75.2 (L) 05/11/2020 0754   MCV 86 05/15/2019 1117   MCH 26.8 05/11/2020 0754   MCHC 35.6 05/11/2020 0754   RDW 18.6 (H) 05/11/2020 0754   RDW 13.9 05/15/2019 1117   LYMPHSABS 1,360 06/24/2016 1024   MONOABS 480 06/24/2016 1024   EOSABS 80 06/24/2016 1024   BASOSABS 0 06/24/2016 1024    BMET    Component Value Date/Time   NA 137 05/11/2020 0754   NA 140 10/01/2019 1041   K 4.0 05/11/2020 0754   CL 105 05/11/2020 0754   CO2 24 05/11/2020 0754   GLUCOSE 133 (H) 05/11/2020 0754   BUN 14 05/11/2020 0754   BUN 11 10/01/2019 1041   CREATININE 0.97 05/11/2020 0754   CREATININE 1.02 01/20/2017 0908   CALCIUM 8.6 (L) 05/11/2020 0754   GFRNONAA >60 05/11/2020 0754   GFRAA >60 11/14/2019 0508    INR    Component Value Date/Time   INR 1.2 05/08/2020 1450     Intake/Output Summary (Last 24 hours) at 05/13/2020 0739 Last data filed at 05/13/2020 0600 Gross per 24 hour  Intake --  Output 1050 ml  Net -1050 ml      Assessment/Plan:  60 y.o. male is s/p  ABF and redo L fem-pop bypass with PTFR 5 Days Post-Op. Overall doing well post op. Pain better controlled. Some drainage from both left groin and BK pop incisions. Bilateral lower extremities well perfused and warm. Passing flatus. Still  No BM. Continue clear fluids with possible advance to soft diet later today if BM. Encourage mobilization and oob to chair. TOC working on [facility] and DME needs  DVT prophylaxis: sq heparin   Graceann Congress, PA-C [facility] [phone] 05/13/2020 7:39 AM   Agree with the above.  Will advance diet to regular.  Some drainage from left groin.  Continue to monitor.  Incision is intact without erythema  [name]

## 2020-05-14 MED ORDER — PNEUMOCOCCAL VAC POLYVALENT 25 MCG/0.5ML IJ INJ
0.5000 mL | INJECTION | INTRAMUSCULAR | Status: AC
  Administered 2020-05-15: 0.5 mL via INTRAMUSCULAR
  Filled 2020-05-14: qty 0.5

## 2020-05-14 MED ORDER — ATORVASTATIN CALCIUM 10 MG PO TABS
20.0000 mg | ORAL_TABLET | Freq: Every day | ORAL | Status: DC
  Administered 2020-05-15 – 2020-05-16 (×2): 20 mg via ORAL
  Filled 2020-05-14 (×2): qty 2

## 2020-05-14 MED ORDER — FUROSEMIDE 10 MG/ML IJ SOLN
20.0000 mg | Freq: Once | INTRAMUSCULAR | Status: AC
  Administered 2020-05-14: 20 mg via INTRAVENOUS
  Filled 2020-05-14: qty 2

## 2020-05-14 NOTE — Progress Notes (Signed)
Mobility Specialist: Progress Note   05/14/20 1403  Mobility  Activity Ambulated in hall  Level of Assistance Modified independent, requires aide device or extra time  Assistive Device Front wheel walker  Distance Ambulated (ft) 310 ft  Mobility Response Tolerated well  Mobility performed by Mobility specialist  $Mobility charge 1 Mobility   Pre-Mobility: 92 HR Post-Mobility: 75 HR, 120/99 BP, 97% SpO2  Pt c/o a little pain in his RLE after returning to room, no rating given. Pt otherwise asx during ambulation. Pt to bed after walk.   [facility] Jenniefer Salak Mobility Specialist Mobility Specialist Phone: [phone]

## 2020-05-14 NOTE — Progress Notes (Addendum)
Progress Note    05/14/2020 7:31 AM 6 Days Post-Op  Subjective:  No complaints; says he is passing gas.  Has had a bowel movement since surgery a couple of days ago.  Tolerating diet without nausea/vomiting.   Afebrile HR 70's-90's NSR 120's-140's systolic 95% RA  Vitals:   05/13/20 2326 05/14/20 0340  BP: 126/84 (!) 143/88  Pulse: 89 69  Resp: 15 15  Temp: (!) 97.5 F (36.4 C) 97.9 F (36.6 C)  SpO2: 96% 95%    Physical Exam: Cardiac:  regular Lungs:  Non labored Incisions:  Midline incision is healing nicely; right groin healing nicely with minimal drainage.  Left groin healing but has moderate drainage. Below knee incision is healing nicely. Extremities:  Brisk right PT and left peroneal Abdomen:  Soft, mildly tender; +flatus  CBC    Component Value Date/Time   WBC 10.7 (H) 05/11/2020 0754   RBC 4.07 (L) 05/11/2020 0754   HGB 10.9 (L) 05/11/2020 0754   HGB 14.9 05/15/2019 1117   HCT 30.6 (L) 05/11/2020 0754   HCT 41.4 05/15/2019 1117   PLT 274 05/11/2020 0754   PLT 262 05/15/2019 1117   MCV 75.2 (L) 05/11/2020 0754   MCV 86 05/15/2019 1117   MCH 26.8 05/11/2020 0754   MCHC 35.6 05/11/2020 0754   RDW 18.6 (H) 05/11/2020 0754   RDW 13.9 05/15/2019 1117   LYMPHSABS 1,360 06/24/2016 1024   MONOABS 480 06/24/2016 1024   EOSABS 80 06/24/2016 1024   BASOSABS 0 06/24/2016 1024    BMET    Component Value Date/Time   NA 137 05/11/2020 0754   NA 140 10/01/2019 1041   K 4.0 05/11/2020 0754   CL 105 05/11/2020 0754   CO2 24 05/11/2020 0754   GLUCOSE 133 (H) 05/11/2020 0754   BUN 14 05/11/2020 0754   BUN 11 10/01/2019 1041   CREATININE 0.97 05/11/2020 0754   CREATININE 1.02 01/20/2017 0908   CALCIUM 8.6 (L) 05/11/2020 0754   GFRNONAA >60 05/11/2020 0754   GFRAA >60 11/14/2019 0508    INR    Component Value Date/Time   INR 1.2 05/08/2020 1450     Intake/Output Summary (Last 24 hours) at 05/14/2020 0731 Last data filed at 05/14/2020 0650 Gross per  24 hour  Intake 360 ml  Output 2625 ml  Net -2265 ml     Assessment:  61 y.o. male is s/p:  ABF with redo left femoral to popliteal bypass  6 Days Post-Op  Plan: -pt with monophasic right DP/PT and left peroneal.  Bilateral feet are warm and well perfused.   -moderate drainage from left groin - needs to keep dry, otherwise, incisions are healing nicely -he has ambulated and tolerating diet.  Dc IVF -DVT prophylaxis:  Sq heparin -continue statin/plavix   Doreatha Massed, PA-C [facility] [phone] 05/14/2020 7:31 AM  I agree with the above.  I have seen and evaluated the patient.  He is postop day #7 status post aortobifemoral bypass graft and left femoral to below-knee popliteal artery bypass graft with Gore-Tex.  I am monitoring drainage from his left groin incision which remains intact.  He has been started on a regular diet.  He has had some issues with desaturation with ambulation.  I suspect that he is a little volume overloaded and so I am giving him a dose of Lasix today.  He may need another dose tomorrow.  Hopefully he will be able to go home in the near future  Annamarie Major

## 2020-05-14 NOTE — Progress Notes (Signed)
Physical Therapy Treatment Patient Details Name: Benjamin [name] MRN: [id number] DOB: 15-Jun-1960 Today's Date: 05/14/2020    History of Present Illness Pt is a 60 y/o male admitted 3/18 and s/p Aorto bifemoral bypass, Re-do left femoral below knee popliteal bypass, Re-do bilateral femoral and popliteal artery exposure. NGT to suction 3/19. 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    PT Comments    Pt received in supine, agreeable to therapy session with encouragement and with improved activity tolerance during gait/stair trial. Pt requiring min guard at most and at times Supervision level for gait/stair training, no loss of balance and VSS on RA (SpO2 >92% on RA with exertion). Pt continues to benefit from PT services to progress toward functional mobility goals. Continue to recommend HHPT, pt may benefit from rollator upon discharge as he reports high pile carpet in the place he will be staying upon discharge and RW may not be safe to navigate over his carpet. Plan to trial rollator next session to ensure safety.    Follow Up Recommendations  Home health PT     Equipment Recommendations  Other (comment) (rollator)    Recommendations for Other Services       Precautions / Restrictions Precautions Precautions: Fall Precaution Comments: watch SpO2/HR Restrictions Weight Bearing Restrictions: No    Mobility  Bed Mobility Overal bed mobility: Needs Assistance Bed Mobility: Supine to Sit;Sit to Supine     Supine to sit: Modified independent (Device/Increase time) Sit to supine: Modified independent (Device/Increase time)   General bed mobility comments: no physical assist needed    Transfers Overall transfer  level: Needs assistance Equipment used: Rolling walker (2 wheeled) Transfers: Sit to/from Stand Sit to Stand: Supervision         General transfer comment: supervision for sit<>stand from EOB and chair<>RW  Ambulation/Gait Ambulation/Gait assistance: Supervision;Min guard Gait Distance (Feet): 120 Feet Assistive device: Rolling walker (2 wheeled) Gait Pattern/deviations: Step-through pattern;Decreased stance time - left;Decreased stride length Gait velocity: decreased   General Gait Details: SpO2 92-97% on RA with exertion/stair trial; HR 79 bpm resting and WNL during activity; BP 125/87 (100) seated, he denies dizziness and not further assessed   Stairs Stairs: Yes Stairs assistance: Min guard Stair Management: One rail Left;One rail Right;Step to pattern;Forwards Number of Stairs: 6 (3+3 steps) General stair comments: no LOB, pt needs consistent cues for sequencing due to painful LLE; using B rails first 3 then just L rail for next 3   Wheelchair Mobility    Modified Rankin (Stroke Patients Only)       Balance Overall balance assessment: Needs assistance Sitting-balance support: Feet supported;No upper extremity supported Sitting balance-Leahy Scale: Good     Standing balance support: Bilateral upper extremity supported;During functional activity Standing balance-Leahy Scale: Fair Standing balance comment: reliant on BUE support during mobility due to LLE pain but able to stand and don mask no LOB                            Cognition Arousal/Alertness: Awake/alert Behavior During Therapy: WFL for tasks assessed/performed Overall Cognitive Status: No family/caregiver present to determine baseline cognitive functioning Area of Impairment: Safety/judgement;Problem solving  Following Commands: Follows one step commands consistently;Follows multi-step commands with increased time Safety/Judgement: Decreased awareness of  safety;Decreased awareness of deficits   Problem Solving: Slow processing;Requires verbal cues;Requires tactile cues General Comments: pt following commmands consistently but needs increased time to follow multi step commands, pt overall slow to process needing repetition of education for full understanding. needs cues for stair sequencing prior to each step ascending/descending      Exercises General Exercises - Lower Extremity Ankle Circles/Pumps: AROM;Both;10 reps;Supine Other Exercises Other Exercises: Instructed pt in heel cord stretch on LLE with sheet/end of bed with static hold x1 min ea    General Comments General comments (skin integrity, edema, etc.): see VS under gait comments; WNL on RA; LLE elevated at end of session, encouraged heel cord stretch 3x1 minute ea at end of bed for improved dorsiflexion      Pertinent Vitals/Pain Pain Assessment: 0-10 Pain Score: 5  Pain Location: L ankle Pain Descriptors / Indicators: Aching;Sore Pain Intervention(s): Monitored during session;Repositioned;Ice applied    Home Living                      Prior Function            PT Goals (current goals can now be found in the care plan section) Acute Rehab PT Goals Patient Stated Goal: to go home PT Goal Formulation: With patient Time For Goal Achievement: 05/22/20 Potential to Achieve Goals: Good Progress towards PT goals: Progressing toward goals    Frequency    Min 3X/week      PT Plan Current plan remains appropriate    Co-evaluation              AM-PAC PT "6 Clicks" Mobility   Outcome Measure  Help needed turning from your back to your side while in a flat bed without using bedrails?: None Help needed moving from lying on your back to sitting on the side of a flat bed without using bedrails?: None Help needed moving to and from a bed to a chair (including a wheelchair)?: A Little Help needed standing up from a chair using your arms (e.g., wheelchair  or bedside chair)?: A Little Help needed to walk in hospital room?: A Little Help needed climbing 3-5 steps with a railing? : A Little 6 Click Score: 20    End of Session Equipment Utilized During Treatment: Gait belt Activity Tolerance: Patient tolerated treatment well Patient left: in bed;with call bell/phone within reach;with SCD's reapplied (SCD to RLE only; ice to L ankle) Nurse Communication: Mobility status PT Visit Diagnosis: Muscle weakness (generalized) (M62.81);Difficulty in walking, not elsewhere classified (R26.2);Pain Pain - Right/Left: Left Pain - part of body: Ankle and joints of foot     Time: 1545-1615 PT Time Calculation (min) (ACUTE ONLY): 30 min  Charges:  $Gait Training: 23-37 mins                     Pacey Altizer P., PTA Acute Rehabilitation Services Pager: [phone] Office: [phone]   Angus Palms 05/14/2020, 5:27 PM

## 2020-05-15 ENCOUNTER — Other Ambulatory Visit: Payer: Self-pay | Admitting: Physician Assistant

## 2020-05-15 MED ORDER — CLOPIDOGREL BISULFATE 75 MG PO TABS
75.0000 mg | ORAL_TABLET | Freq: Every day | ORAL | Status: DC
  Administered 2020-05-15 – 2020-05-16 (×2): 75 mg via ORAL
  Filled 2020-05-15 (×2): qty 1

## 2020-05-15 MED ORDER — ACETAMINOPHEN 500 MG PO TABS: 1000.0000 mg | ORAL_TABLET | Freq: Four times a day (QID) | ORAL | 0 refills | Status: DC | PRN

## 2020-05-15 MED ORDER — ADULT MULTIVITAMIN W/MINERALS CH
1.0000 | ORAL_TABLET | Freq: Every day | ORAL | Status: DC
  Administered 2020-05-15 – 2020-05-16 (×2): 1 via ORAL
  Filled 2020-05-15 (×2): qty 1

## 2020-05-15 MED ORDER — BUPROPION HCL ER (XL) 150 MG PO TB24: 150.0000 mg | ORAL_TABLET | ORAL | 1 refills | Status: DC

## 2020-05-15 MED ORDER — ATORVASTATIN CALCIUM 20 MG PO TABS: 40.0000 mg | ORAL_TABLET | Freq: Every day | ORAL | 2 refills | Status: DC

## 2020-05-15 MED ORDER — OXYCODONE-ACETAMINOPHEN 5-325 MG PO TABS
1.0000 | ORAL_TABLET | ORAL | 0 refills | Status: DC | PRN
Start: 2020-05-15 — End: 2020-06-02

## 2020-05-15 MED ORDER — CLOPIDOGREL BISULFATE 75 MG PO TABS: 75.0000 mg | ORAL_TABLET | Freq: Every day | ORAL | 2 refills | Status: DC

## 2020-05-15 MED ORDER — GABAPENTIN 100 MG PO CAPS: 100.0000 mg | ORAL_CAPSULE | ORAL | 2 refills | Status: DC

## 2020-05-15 MED FILL — buPROPion HCL ER (XL) 150 M: 150 | 30 days supply | Qty: 30 | Fill #0

## 2020-05-15 MED FILL — CLOPIDOGREL 75 MG TABLET: 75 | 30 days supply | Qty: 30 | Fill #0

## 2020-05-15 MED FILL — ACETAMINOPHEN 500MG XT STRE: 500 | 13 days supply | Qty: 100 | Fill #0

## 2020-05-15 MED FILL — ATORVASTATIN CALCIUM 40 MG: 40 | 30 days supply | Qty: 30 | Fill #0

## 2020-05-15 MED FILL — OXYCODONE-APAP 5-325MG: 5-325 | 5 days supply | Qty: 30 | Fill #0

## 2020-05-15 MED FILL — GABAPENTIN 100 MG CAPSULE: 100 | 30 days supply | Qty: 90 | Fill #0

## 2020-05-15 NOTE — Discharge Instructions (Signed)
 [facility] [place]  Discharge Instructions   Open Aortic Surgery  Please refer to the following instructions for your post-procedure care. Your surgeon or Physician Assistant will discuss any changes with you.  Activity  Avoid lifting more than eight pounds (a gallon of milk) until after your first post-operative visit. You are encouraged to walk as much as you can. You can slowly return to normal activities but must avoid strenuous activity and heavy lifting until your doctor tells you it's okay. Heavy lifting can hurt the incision and cause a hernia. Avoid activities such as vacuuming or swinging a golf club. It is normal to feel tired for several weeks after your surgery. Do not drive until your doctor gives the okay and you are no longer taking prescription pain medications. It is also normal to have difficulty with sleep habits, eating and bowl movements after surgery. These will go away with time.  Bathing/Showering  Shower daily after you go home. Do not soak in a bathtub, hot tub, or swim until the incision heals.  Incision Care  Shower every day. Clean your incision with mild soap and water. Pat the area dry with a clean towel. You do not need a bandage unless otherwise instructed. Do not apply any ointments or creams to your incision. You may have skin glue on your incision. Do not peel it off. It will come off on its own in about one week. If you have staples or sutures along your incision, they will be removed at your post op appointment.  If you have groin incisions, wash the groin wounds with soap and water daily and pat dry. (No tub bath-only shower)  Then put a dry gauze or washcloth in the groin to keep this area dry to help prevent wound infection.  Do this daily and as needed.  Do not use Vaseline or neosporin on your incisions.  Only use soap and water on your incisions and then protect and keep dry.  Diet  Resume your normal diet. There are no  special food restriction following this procedure. A low fat/low cholesterol diet is recommended for all patients with vascular disease. After your aortic surgery, it's normal to feel full faster than usual and to not feel as hungry as you normally would. You will probably lose weight initially following your surgery. It's best to eat small, frequent meals over the course of the day. Call the office if you find that you are unable to eat even small meals.   In order to heal from your surgery, it is CRITICAL to get adequate nutrition. Your body requires vitamins, minerals, and protein. Vegetables are the best source of vitamins and minerals. If you have pain, you may take over-the-counter pain reliever such as acetaminophen (Tylenol). If you were prescribed a stronger pain medication, please be aware these medication can cause nausea and constipation. Prevent nausea by taking the medication with a snack or meal. Avoid constipation by drinking plenty of fluids and eating foods with a high amount of fiber, such as fruits, vegetables and grains. Take 100mg of the over-the-counter stool softener Colace twice a day as needed to help with constipation. A laxative, such as Milk of Magnesia, may be recommended for you at this time. Do not take a laxative unless your surgeon or P.A. tells you it's OK.  Do not take Tylenol if you are taking stronger pain medications (such as Percocet).  Follow Up  Our office will schedule a follow up   appointment 2-3 weeks after discharge.  Please call us immediately for any of the following conditions    .     Severe or worsening pain in your legs or feet or in your abdomen back or chest. Increased pain, redness drainage (pus) from your incision site. Increased abdominal pain, bloating, nausea, vomiting, or persistent diarrhea. Fever of 101 degrees or higher. Swelling in your leg (s).  Reduce your risk of vascular disease  Stop smoking. If you would like help, call  [facility] at 1-800-QUIT-NOW (1-800-784-8669) or [place] at [phone]. Manage your cholesterol Maintain a desired weight Control your diabetes Keep your blood pressure down  If you have any questions please call the office at [phone].   

## 2020-05-15 NOTE — Progress Notes (Signed)
Mobility Specialist: Progress Note   05/15/20 1411  Mobility  Activity Ambulated in hall  Level of Assistance Modified independent, requires aide device or extra time  Assistive Device Four wheel walker  Distance Ambulated (ft) 270 ft  Mobility Response Tolerated well  Mobility performed by Mobility specialist  $Mobility charge 1 Mobility   Post-Mobility: 64 HR, 143/81 BP, 100% SpO2  Pt c/o calf tightness in his LLE during ambulation. Pt otherwise asx. Pt back to bed after walk.   [facility] Day Mobility Specialist Mobility Specialist Phone: [phone]

## 2020-05-15 NOTE — Progress Notes (Addendum)
  Progress Note    05/15/2020 7:55 AM 7 Days Post-Op  Subjective:  No complaints.  BM this morning.   Vitals:   05/14/20 2306 05/15/20 0241  BP: 121/77 117/81  Pulse: 78 76  Resp: 16 16  Temp: 98.1 F (36.7 C) 98 F (36.7 C)  SpO2: 96% 95%   Physical Exam: Lungs:  Non labored Incisions:  abd incision healing well Extremities:  L popliteal incision c/d/i; groin incisions with serous drainage; brisk R PT; brisk L peroneal, soft PT Abdomen:  Soft, NT, ND Neurologic: A&O  CBC    Component Value Date/Time   WBC 10.7 (H) 05/11/2020 0754   RBC 4.07 (L) 05/11/2020 0754   HGB 10.9 (L) 05/11/2020 0754   HGB 14.9 05/15/2019 1117   HCT 30.6 (L) 05/11/2020 0754   HCT 41.4 05/15/2019 1117   PLT 274 05/11/2020 0754   PLT 262 05/15/2019 1117   MCV 75.2 (L) 05/11/2020 0754   MCV 86 05/15/2019 1117   MCH 26.8 05/11/2020 0754   MCHC 35.6 05/11/2020 0754   RDW 18.6 (H) 05/11/2020 0754   RDW 13.9 05/15/2019 1117   LYMPHSABS 1,360 06/24/2016 1024   MONOABS 480 06/24/2016 1024   EOSABS 80 06/24/2016 1024   BASOSABS 0 06/24/2016 1024    BMET    Component Value Date/Time   NA 137 05/11/2020 0754   NA 140 10/01/2019 1041   K 4.0 05/11/2020 0754   CL 105 05/11/2020 0754   CO2 24 05/11/2020 0754   GLUCOSE 133 (H) 05/11/2020 0754   BUN 14 05/11/2020 0754   BUN 11 10/01/2019 1041   CREATININE 0.97 05/11/2020 0754   CREATININE 1.02 01/20/2017 0908   CALCIUM 8.6 (L) 05/11/2020 0754   GFRNONAA >60 05/11/2020 0754   GFRAA >60 11/14/2019 0508    INR    Component Value Date/Time   INR 1.2 05/08/2020 1450     Intake/Output Summary (Last 24 hours) at 05/15/2020 0755 Last data filed at 05/15/2020 0549 Gross per 24 hour  Intake 360 ml  Output 3000 ml  Net -2640 ml     Assessment/Plan:  60 y.o. male is s/p ABF and L fem pop 7 Days Post-Op   BLE well perfused based on doppler exam Serous drainage L more than R groin incision; continue BID dry dressing changes Tolerating  regular diet; BM today TOC working on [facility] PT; patient is un-insured, waiting to hear back about charity care Home possibly tomorrow   Emilie Rutter, PA-C [facility] [phone] 05/15/2020 7:55 AM  VASCULAR STAFF ADDENDUM: I have independently interviewed and examined the patient. I agree with the above.   Rande Brunt. Lenell Antu, MD [facility] Phone Number: [phone] 05/15/2020 6:52 PM

## 2020-05-15 NOTE — Progress Notes (Signed)
Initial Nutrition Assessment  DOCUMENTATION CODES:  Severe malnutrition in context of chronic illness  INTERVENTION:  Add Magic cup TID with meals, each supplement provides 290 kcal and 9 grams of protein.  Add MVI with minerals daily.  NUTRITION DIAGNOSIS:  Severe Malnutrition related to chronic illness (peripheral arterial occulusive disease) as evidenced by moderate muscle depletion,severe fat depletion,moderate fat depletion,severe muscle depletion.  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 Pt in good spirits. He is excited to discharge tomorrow per plan. He reports not eating so well at home and in the hospital since he has gotten the stent placed. He is eating better now though within the last few days and has his appetite back.  He also reports a steady weight loss of 10-15 lbs this past year. Per Epic, pt's weight has increased since 11/2019 by about 4 kg.   Due to discharge plan for tomorrow, discussed long-term plan for how to replete muscle and fat losses at home. Pt reports that he enjoys Boost and will be buying it to drink at home, as he's had it before and prefers it to Ensure. Pt also reports that he will be having more ice cream (mint chocolate chip specifically) to help with caloric intake. Encouraged him to keep on track of this plan and to include sources of protein at meals.   While still inpatient, will provide Magic Cups TID per pt request and for muscle and fat repletion, given severe malnutrition.  Relevant Medications: colace, Protonix Labs: reviewed  NUTRITION - FOCUSED PHYSICAL EXAM: Flowsheet Row  Most Recent Value  Orbital Region Severe depletion  Upper Arm Region Moderate depletion  Thoracic and Lumbar Region Moderate depletion  Buccal Region Severe depletion  Temple Region Severe depletion  Clavicle Bone Region Severe depletion  Clavicle and Acromion Bone Region Moderate depletion  Scapular Bone Region Moderate depletion  Dorsal Hand Moderate depletion  Patellar Region Severe depletion  Anterior Thigh Region Moderate depletion  Posterior Calf Region Moderate depletion  Edema (RD Assessment) None  Hair Reviewed  Eyes Reviewed  [pale conjunctiva]  Mouth Reviewed  Skin Reviewed  [dry]  Nails Reviewed  [pale nail beds]     Diet Order:   Diet Order            Diet regular Room service appropriate? Yes with Assist; Fluid consistency: Thin  Diet effective now                EDUCATION NEEDS:  Education needs have been addressed  Skin:  Skin Assessment: Skin Integrity Issues: Skin Integrity Issues:: Incisions Incisions: L foot, L leg, abdomen, and groin  Last BM:  05/15/20  Height:  Ht Readings from Last 1 Encounters:  05/08/20 5' 6.8" (1.697 m)   Weight:  Wt Readings from Last 1 Encounters:  05/08/20 66.7 kg   Ideal Body Weight:  67.3 kg  BMI:  Body mass index is 23.16 kg/m.  Estimated Nutritional Needs:  Kcal:  1900-2100 Protein:  90-105 grams Fluid:  >2 L  Vertell Limber, RD, LDN Registered Dietitian After Hours/Weekend Pager # in Am[place]

## 2020-05-16 NOTE — Progress Notes (Signed)
Mobility Specialist - Progress Note   05/16/20 1035  Mobility  Activity Ambulated in hall  Level of Assistance Standby assist, set-up cues, supervision of patient - no hands on  Assistive Device None  Distance Ambulated (ft) 200 ft  Mobility Response Tolerated well  Mobility performed by Mobility specialist  $Mobility charge 1 Mobility   Pre-mobility: 82 HR, 146/93 BP Post-mobility: 94 HR, 104/72 BP  Pt ambulated on 2L O2, asx throughout. Pt sitting up on edge of bed after walk.  Mamie Levers Mobility Specialist Mobility Specialist Phone: [phone]

## 2020-05-16 NOTE — Progress Notes (Signed)
Patient discharged by wheelchair to the [facility].  IV moved, telemetry discontinued and returned to tele station, TOC meds given to patient.  AVS given to patient with all questions answered.

## 2020-05-16 NOTE — Progress Notes (Addendum)
  Progress Note    05/16/2020 9:48 AM 8 Days Post-Op  Subjective:  Complaining of some sticking/ burning pains in left leg   Vitals:   05/16/20 0534 05/16/20 0755  BP: 130/89 119/86  Pulse: 86 89  Resp: 18 20  Temp: (!) 97.5 F (36.4 C) 97.8 F (36.6 C)  SpO2: 96% 94%   Physical Exam: Cardiac:  regular Lungs: non labored Incisions: b groin incisions with serous drainage left greater than right. Dressings changed. No erythema present. Incisions still intact. Left popliteal incisions is clean, dry and intact without swelling or hematoma Extremities: well perfused and warm with doppler right PT and left PT/ Pero signals Abdomen:  Laparotomy incision is clean, dry and intact Neurologic: alert and oriented  CBC    Component Value Date/Time   WBC 10.7 (H) 05/11/2020 0754   RBC 4.07 (L) 05/11/2020 0754   HGB 10.9 (L) 05/11/2020 0754   HGB 14.9 05/15/2019 1117   HCT 30.6 (L) 05/11/2020 0754   HCT 41.4 05/15/2019 1117   PLT 274 05/11/2020 0754   PLT 262 05/15/2019 1117   MCV 75.2 (L) 05/11/2020 0754   MCV 86 05/15/2019 1117   MCH 26.8 05/11/2020 0754   MCHC 35.6 05/11/2020 0754   RDW 18.6 (H) 05/11/2020 0754   RDW 13.9 05/15/2019 1117   LYMPHSABS 1,360 06/24/2016 1024   MONOABS 480 06/24/2016 1024   EOSABS 80 06/24/2016 1024   BASOSABS 0 06/24/2016 1024    BMET    Component Value Date/Time   NA 137 05/11/2020 0754   NA 140 10/01/2019 1041   K 4.0 05/11/2020 0754   CL 105 05/11/2020 0754   CO2 24 05/11/2020 0754   GLUCOSE 133 (H) 05/11/2020 0754   BUN 14 05/11/2020 0754   BUN 11 10/01/2019 1041   CREATININE 0.97 05/11/2020 0754   CREATININE 1.02 01/20/2017 0908   CALCIUM 8.6 (L) 05/11/2020 0754   GFRNONAA >60 05/11/2020 0754   GFRAA >60 11/14/2019 0508    INR    Component Value Date/Time   INR 1.2 05/08/2020 1450     Intake/Output Summary (Last 24 hours) at 05/16/2020 0948 Last data filed at 05/16/2020 0756 Gross per 24 hour  Intake 1440 ml  Output  1825 ml  Net -385 ml     Assessment/Plan:  60 y.o. male is s/p ABF and Left fem pop 8 Days Post-Op.   BLE well perfused and warm with doppler R PT and Left PT/ peroneal signals.  Bilateral groin incisions with serous drainage, L> R. Keep dry gauze and continue BID changes.  Last BM two days ago. Tolerating diet.  Encourage mobilization oob to chair TOC still working on [facility] DME needs and PT. D/C when arrangements have been made  DVT prophylaxis:  Sq heparin   Graceann Congress, [place] [facility] [phone] 05/16/2020 9:48 AM   VASCULAR STAFF ADDENDUM: I have independently interviewed and examined the patient. I agree with the above.  OK for discharge from a medical standpoint.  Social / HH issues limiting DC at the moment.  Rande Brunt. Lenell Antu, MD [facility] Phone Number: [phone] 05/16/2020 11:28 AM

## 2020-05-16 NOTE — Discharge Summary (Signed)
Aorta Discharge Summary    Benjamin [name] 07-30-1960 60 y.o. 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 Patient remained stable he was taken to the ICU in stable condition.  POD# 1 He did well overnight with patent bypass graft. He was stable for transfer out of ICU to 4E. Nasogastric tube and NPO status was maintained. Encourage mobilization. PT/ OT evaluated and recommendation was made for Home Health PT  POD#2 again did well overnight. Continued adequate perfusion of bilateral lower extremities. Abdominal incision, bilateral groin and left lower extremity incisions clean, dry and intact. NG with significant output. No flatus or BM. Good urine output. Continued to encourage mobilization. Otherwise vitals remained stable. Hemodynamically stable. Afebrile. Tolerated working with mobility specialist fair with some increased surgical site pain in left lower extremity on ambulation   POD#3 it was noted by nursing staff that the doppler in the right lower extremity was no longer able to be found. He did not complaining of pain or numbness in the foot on the right.  He has a PT and peroneal doppler signal. Left foot had brisk doppler flow as well. Incisions intact.  Otherwise still no return of bowel function. No flatus or bowel movement. NG tube and foley removed. Good urine output. Hemodynamically stable otherwise. Afebrile.   The remainder of the hospital course  consisted of increasing mobilization, pain control, monitoring of incisions, continued adequate voiding and bowel function including increasing intake of solids without difficulty. Transition of care was consulted to assist with home health and DME needs.   POD#8 3/26 He remained stable for discharge home. Continued adequate lower extremity perfusion. Incisions intact with some serous drainage from the left greater than right groin incision but no concern for infection. Patient instructed on adequate wound care. [facility] able to accept patient for home health PT/ OT needs and [facility] was able to supply patient with DME 3 in 1. Patient to go home on Plavix and statin. Unable to take aspirin due to intolerance. PDMP was reviewed and pain medication was sent to patients requested pharmacy. Follow up was arranged on 06/08/20 with Dr. Myra Gianotti  CBC    Component Value Date/Time   WBC 10.7 (H) 05/11/2020 0754   RBC 4.07 (L) 05/11/2020 0754   HGB 10.9 (L) 05/11/2020 0754   HGB 14.9 05/15/2019 1117   HCT 30.6 (L) 05/11/2020 0754   HCT 41.4 05/15/2019 1117   PLT 274 05/11/2020 0754   PLT 262 05/15/2019 1117   MCV 75.2 (L) 05/11/2020 0754   MCV 86 05/15/2019 1117   MCH 26.8 05/11/2020 0754   MCHC 35.6 05/11/2020 0754   RDW 18.6 (H) 05/11/2020 0754   RDW 13.9 05/15/2019 1117   LYMPHSABS 1,360 06/24/2016 1024   MONOABS 480 06/24/2016 1024   EOSABS 80 06/24/2016 1024   BASOSABS 0 06/24/2016 1024    BMET    Component Value Date/Time   NA 137 05/11/2020 0754   NA 140 10/01/2019 1041  K 4.0 05/11/2020 0754   CL 105 05/11/2020 0754   CO2 24 05/11/2020 0754   GLUCOSE 133 (H) 05/11/2020 0754   BUN 14 05/11/2020 0754   BUN 11 10/01/2019 1041   CREATININE 0.97 05/11/2020 0754   CREATININE 1.02 01/20/2017 0908   CALCIUM 8.6 (L) 05/11/2020 0754   GFRNONAA >60 05/11/2020 0754   GFRAA >60 11/14/2019 0508     Discharge Instructions    Call MD for:  redness, tenderness, or signs of infection (pain,  swelling, redness, odor or green/yellow discharge around incision site)   Complete by: As directed    Call MD for:  severe uncontrolled pain   Complete by: As directed    Call MD for:  temperature >100.4   Complete by: As directed    Diet - low sodium heart healthy   Complete by: As directed    Discharge patient   Complete by: As directed    Discharge disposition: 01-Home or Self Care   Discharge patient date: 05/16/2020   Discharge wound care:   Complete by: As directed    May shower daily. Clean incisions with mild soap and water. Pat dry. Do not soak in bath tub. Do not apply any topical ointments like neosporin or lotions to incisions. Keep dry gauze in both groins if continued drainage. 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 Femoral-popliteal bypass graft occlusion, 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  . PAD (peripheral artery disease) (HCC) 11/13/2019  . Callus of foot 07/01/2019  . Intermittent claudication (HCC) 01/20/2017  . Need for influenza vaccination 01/20/2017  . Needs smoking cessation education 09/06/2016  . Abnormal echocardiogram 09/05/2016  . Abnormal EKG 07/25/2016  . Preoperative cardiovascular examination 07/25/2016  . Shortness of breath on exertion 07/25/2016  . Dyslipidemia, goal LDL below 70 07/25/2016  . Uncontrolled hypertension 07/25/2016  . Abnormal chest x-ray 07/04/2016  . Peripheral vascular disease (HCC) 07/04/2016  . Decreased pedal pulses 06/24/2016  . Varicose vein of leg 06/24/2016  . Heavy smoker 06/24/2016  . Abnormal  weight loss 06/24/2016  . Foot pain, left 06/24/2016  . Screening for prostate cancer 06/24/2016   Past Medical History:  Diagnosis Date  . Back pain   . Heavy smoker   . Hyperlipidemia LDL goal <70   . Hypertension   . 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   Bilateral femoral artery disease     Allergies as of 05/16/2020      Reactions   Aspirin Nausea And Vomiting   Garlic Other (See Comments)   sneezing      Medication List    STOP taking these medications   amLODipine 5 MG tablet Commonly known as: NORVASC   cilostazol 100 MG tablet Commonly known as: PLETAL     TAKE these medications   acetaminophen 500 MG tablet Commonly known as: TYLENOL Take 2 tablets (1,000 mg total) by mouth every 6 (six) hours as needed for mild pain or headache. What changed: how much to take Notes to patient:  For mild pain or fever. Do not take more than 3000mg  per day.   APPLE CIDER VINEGAR PO Take 1 tablet by mouth at bedtime. Notes to patient: Supplement   atorvastatin 20 MG tablet Commonly known as: LIPITOR Take 2 tablets (40 mg total) by mouth daily. What changed: how much to take Notes to patient: **DOSE INCREASE** To lower cholesterol and decrease artery inflammation. Watch for unexplained muscle aches/pains and talk to a doctor soon if this occurs   buPROPion 150 MG 24 hr tablet Commonly known as: Wellbutrin XL Take 1 tablet (150 mg total) by mouth every morning. Notes to patient: To improve mood   CHANTIX STARTING MONTH PAK PO Take 0.5 mg by mouth daily. Notes to patient: For tobacco cessation   clopidogrel 75 MG tablet Commonly known as: Plavix Take 1 tablet (75 mg total) by mouth daily. Notes to patient: *VERY IMPORTANT* to keep arteries open    gabapentin 100 MG capsule Commonly known as: NEURONTIN Take 1-2 capsules (100-200 mg total) by mouth See admin instructions. Take 100 mg in the morning and 200 mg at bedtime Notes to patient: For nerve  pain. May cause drowsiness and dizziness.    lactose free nutrition Liqd Take 237 mLs by mouth 2 (two) times daily between meals.   LEG CRAMPS PM SL Place 1 tablet under the tongue at bedtime.   oxyCODONE-acetaminophen 5-325 MG tablet Commonly known as: PERCOCET/ROXICET Take 1 tablet by mouth every 4 (four) hours as needed for moderate pain. Notes to patient: **NEW** For severe pain. May cause drowsiness, dizziness and constipation. May use over-the-counter docusate, miralax or senna for constipation            Durable Medical Equipment  (From admission, onward)         Start     Ordered   05/12/20 1338  For home use only DME 3 n 1  Once        05/12/20 1337           Discharge Care Instructions  (From admission, onward)         Start     Ordered   05/16/20 0000  Discharge wound care:       Comments: May shower daily. Clean incisions with mild soap and water. Pat dry. Do not soak in bath tub. Do not apply any topical ointments like neosporin or lotions to incisions. Keep dry gauze in both groins if continued drainage. Change dressings twice daily   05/16/20 1635          Instructions:  [facility] Discharge Instructions Open Aortic Surgery  Please refer to the following instructions for your post-procedure care. Your surgeon or Physician Assistant will discuss any changes with you.  Activity  Avoid lifting more than eight pounds (a gallon of milk) until after your first post-operative visit. You are encouraged to walk as much as you can. You can slowly return to normal activities but must avoid strenuous activity and heavy lifting until your doctor tells you it's OK. Heavy lifting can hurt the incision and cause a hernia. Avoid activities such as vacuuming or swinging a golf club. It is normal to feel tired for several weeks after your surgery. Do not drive until your doctor gives the OK and you are no longer taking prescription  pain medications. It is also normal to have difficulty with sleep habits, eating and bowl movements after surgery. These will go away with time.  Bathing/Showering  You may shower after you go home. Do not soak in a bathtub, hot tub, or swim until the incision heals.  Incision Care  Shower every day. Clean your incision with mild soap and water. Pat the area dry with a clean towel. You do not need a bandage unless otherwise instructed. Do not apply any ointments or creams to your incision. You may have skin glue on your incision. Do not peel it off. It will come off on its own in about one week. If you have staples or sutures along your incision, they will be removed at your post op appointment.  If you have groin incisions, wash the groin wounds with soap and water daily and pat dry. (No tub bath-only shower)  Then put a dry gauze or washcloth in the groin to keep this area dry to help prevent wound infection.  Do this daily and as needed.  Do not use Vaseline or neosporin on your incisions.  Only use soap and water on your incisions and then protect and keep dry.  Diet  Resume your normal diet. There are no special food restriction following this procedure. A low fat/low cholesterol diet is recommended for all patients with vascular disease. After your aortic surgery, it's normal to feel full faster than usual and to not feel as hungry as you normally would. You will probably lose weight initially following your surgery. It's best to eat small, frequent meals over the course of the day. Call the office if you find that you are unable to eat even small meals. In order to heal from your surgery, it is CRITICAL to get adequate nutrition. Your body requires vitamins, minerals, and protein. Vegetables are the best source of vitamins and minerals. causing pain, you may take over-the-counter pain reliever such as acetaminophen (Tylenol). If you were prescribed a stronger pain medication, please be aware  these medication can cause nausea and constipation. Prevent nausea by taking the medication with a snack or meal. Avoid constipation by drinking plenty of fluids and eating foods with a high amount of fiber, such as fruits, vegetables and grains. Take 100mg  of the over-the-counter stool softener Colace twice a day as needed to help with constipation. A laxative, such as Milk of Magnesia, may be recommended for you at this time. Do not take a laxative unless your surgeon or Physician Assistant. tells you it's OK. Do not take Tylenol if you are taking stronger pain medications (such as Percocet).  Follow Up  Our office will schedule a follow up appointment 2-3 weeks after discharge.  Please call us immediately for any of the following conditions    .     Severe or worsening pain in your legs or feet or in your abdomen back or chest. . Increased pain, redness drainage (pus) from your incision site. . Increased abdominal pain, bloating, nausea, vomiting, or persistent diarrhea. . Fever of 101 degrees or higher. . Swelling in your leg (s). .  Reduce your risk of vascular disease  . Stop smoking. If you would like help, call [facility] at 1-800-QUIT-NOW ((713) 088-37671-817-001-5229) or [place] at [phone]. . Manage your cholesterol . Maintain a desired weight . Control your diabetes . Keep your blood pressure down .  If you have any questions please call the office at [phone].   Prescriptions given: 1.  Oxycodone- Acetaminophen 5-325 mg #30 No Refill  Disposition: Home  Patient's condition: is Good  Follow up: 1. Dr. Myra GianottiBrabham in 3 weeks   Emilie RutterMatthew Eveland,  PA-C [facility] [phone] 05/16/2020  4:42 PM   - For VQI Registry use -  Post-op:  Time to Extubation: [X]  In OR, [ ]  < 12 hrs, [ ]  12-24 hrs, [ ]  >=24 hrs Vasopressors Req. Post-op: No ICU Stay: 1 day in medical ICU Transfusion: Yes   If yes, 4 units given MI: No, [ ]  Troponin only, [ ]  EKG or  Clinical New Arrhythmia: No  Complications: CHF: No Resp failure: No, [ ]  Pneumonia, [ ]  Ventilator Chg in renal function: No, [ ]  Inc. Cr > 0.5, [ ]  Temp. Dialysis, [ ]  Permanent dialysis Leg ischemia: No, no Surgery needed, [ ]  Yes, Surgery needed, [ ]  Amputation Bowel ischemia: No, [ ]  Medical Rx, [ ]  Surgical Rx Wound complication: No, [ ]  Superficial separation/infection, [ ]  Return to OR Return to OR: No  Return to OR for bleeding: No Stroke: No, [ ]  Minor, [ ]  Major  Discharge medications: Statin use:  Yes If No:  n/a ASA use:  No  If No:  Intolerance, GI upset Plavix use:  Yes  Beta blocker use:  No  ACEI use:  No ARB use:  No CCB use:  No Coumadin use:  No

## 2020-05-16 NOTE — TOC Progression Note (Addendum)
Transition of Care [facility]) - Progression Note    Patient Details  Name: Benjamin [name] MRN: [id number] Date of Birth: [birth year]  Transition of Care [facility]) CM/SW Contact  Leone Haven, RN Phone Number: 05/16/2020, 2:21 PM  Clinical Narrative:    NCM spoke with [name] with [facility] to see if they are able to take patient for charity for HHPT/HHOT. He states yes.  Patient will also need 3 n 1 for charity, this NCM contacted [facility]  To make referral for 3n1, [facility] said they already delivered this to patient room.  Per Misty Stanley Staff RN patient will need transportation ast home also. NCM assisted patient with transportation.   Expected Discharge Plan: Home w Home Health Services Barriers to Discharge: Continued Medical Work up  Expected Discharge Plan and Services Expected Discharge Plan: Home w Home Health Services   Discharge Planning Services: CM Consult Post Acute Care Choice: Home Health,Durable Medical Equipment Living arrangements for the past 2 months: Single Family Home                 DME Arranged: 3-N-1 DME Agency: [facility] Date DME Agency Contacted: 05/13/20 Time DME Agency Contacted: 1020 Representative spoke with at DME Agency: Jeraldine Loots HH Arranged: PT,OT HH Agency: [facility] Date [facility] Agency Contacted: 05/12/20 Time HH Agency Contacted: 1340 Representative spoke with at [facility] Agency: [name]   Social Determinants of Health (SDOH) Interventions    Readmission Risk Interventions No flowsheet data found.

## 2020-05-29 ENCOUNTER — Telehealth: Payer: Self-pay

## 2020-05-29 NOTE — Telephone Encounter (Signed)
Patient called to report a bulge in his left groin - says it is slightly smaller than a golf ball. Denies redness or drainage, no fever or chills. It is painful when he walks or lays down. Talked to PA - added on to [facility] schedule on Tuesday.

## 2020-06-02 ENCOUNTER — Encounter: Payer: Self-pay | Admitting: Vascular Surgery

## 2020-06-02 ENCOUNTER — Ambulatory Visit (INDEPENDENT_AMBULATORY_CARE_PROVIDER_SITE_OTHER): Payer: Self-pay | Admitting: Vascular Surgery

## 2020-06-02 ENCOUNTER — Other Ambulatory Visit: Payer: Self-pay

## 2020-06-02 VITALS — BP 128/89 | HR 117 | Temp 99.3°F | Resp 20 | Ht 66.0 in | Wt 147.0 lb

## 2020-06-02 DIAGNOSIS — Z9889 Other specified postprocedural states: Secondary | ICD-10-CM

## 2020-06-02 NOTE — Progress Notes (Signed)
[facility] [place] PROGRESS NOTE  ASSESSMENT / PLAN: Benjamin [name] is a 60 y.o. male with postoperative seroma in left groin. This does not appear infected. Counseled that these should be observed whenever possible and to avoid drainage if possible. He reports dysuria. I encouraged him to be evaluated by his primary care physician or urgent care tomorrow for evaluation.   SUBJECTIVE: Reports swelling in the left groin. All his wounds have healed well. He reports dysuria and malaise. He denies cough. He denies redness or drainage about the incisions.  OBJECTIVE: BP 128/89 (BP Location: Left Arm, Patient Position: Sitting, Cuff Size: Normal)   Pulse (!) 117   Temp 99.3 F (37.4 C)   Resp 20   Ht 5\' 6"  (1.676 m)   Wt 147 lb (66.7 kg)   SpO2 100%   BMI 23.73 kg/m   Constitutional: well appearing. no acute distress. CNS: no focal deficits Cardiac: RRR. Pulmonary: unlabored Abdomen: soft Vascular: all incisions well healed. Golf-ball sized seroma in the left groin.  CBC Latest Ref Rng & Units 05/11/2020 05/09/2020 05/08/2020  WBC 4.0 - 10.5 K/uL 10.7(H) 12.2(H) 15.7(H)  Hemoglobin 13.0 - 17.0 g/dL 10.9(L) 12.0(L) 11.6(L)  Hematocrit 39.0 - 52.0 % 30.6(L) 33.8(L) 34.4(L)  Platelets 150 - 400 K/uL 274 360 344     CMP Latest Ref Rng & Units 05/11/2020 05/09/2020 05/08/2020  Glucose 70 - 99 mg/dL 05/10/2020) 073(X) 106(Y)  BUN 8 - 23 mg/dL 14 15 17   Creatinine 0.61 - 1.24 mg/dL 694(W 5.46  Sodium 135 - 145 mmol/L 137 135 136  Potassium 3.5 - 5.1 mmol/L 4.0 4.3 4.4  Chloride 98 - 111 mmol/L 105 103 107  CO2 22 - 32 mmol/L 24 23 21(L)  Calcium 8.9 - 10.3 mg/dL 2.70) 3.50) 0.9(F)  Total Protein 6.5 - 8.1 g/dL - 5.8(L) -  Total Bilirubin 0.3 - 1.2 mg/dL - 0.7 -  Alkaline Phos 38 - 126 U/L - 85 -  AST 15 - 41 U/L - 47(H) -  ALT 0 - 44 U/L - 40 -    Deedee Lybarger N. 8.1(W, MD [facility] Phone Number: [phone] 06/02/2020  6:47 PM

## 2020-06-03 ENCOUNTER — Telehealth: Payer: Self-pay

## 2020-06-03 NOTE — Telephone Encounter (Signed)
Patient called stating he went to the hospital because he has something in her groin on the left side and he was told to give [name] a call.   I returned pt call and left him a vm to call the office [facility]

## 2020-06-08 ENCOUNTER — Encounter: Payer: Self-pay | Admitting: Surgery

## 2020-06-11 ENCOUNTER — Telehealth: Payer: Self-pay

## 2020-06-11 NOTE — Telephone Encounter (Signed)
Pt LVM that he needed to speak to provider regarding his health called pt twice someone kept picking up phone but not responding

## 2020-06-15 ENCOUNTER — Encounter: Payer: Self-pay | Admitting: Surgery

## 2020-06-15 ENCOUNTER — Other Ambulatory Visit: Payer: Self-pay

## 2020-06-15 ENCOUNTER — Ambulatory Visit (INDEPENDENT_AMBULATORY_CARE_PROVIDER_SITE_OTHER): Payer: Self-pay | Admitting: Surgery

## 2020-06-15 VITALS — BP 146/93 | HR 90 | Temp 98.2°F | Resp 20 | Ht 66.0 in | Wt 140.0 lb

## 2020-06-15 DIAGNOSIS — I7025 Atherosclerosis of native arteries of other extremities with ulceration: Secondary | ICD-10-CM

## 2020-06-15 NOTE — Progress Notes (Signed)
Patient name: LYNDEL SARATE MRN: [id number] DOB: Feb 27, 1960 Sex: male  REASON FOR VISIT:    Postop  HISTORY OF PRESENT ILLNESS:   JULUS KELLEY a 60 y.o.malewith severe PAD, who I met in 2018.  He was having 1 block claudication.  He also was waking up at night with pain in his left leg which was improved by hanging it over the bed.He underwent angiography in 2018 which revealed left superficial femoral and popliteal artery occlusion with reconstitution of the posterior tibial artery.  There is diffuse disease throughout the right superficial femoral and popliteal artery with occlusion of the distal popliteal artery and reconstitution of the posterior tibial artery.  He ultimately decided to not pursue surgical revascularization at that time.  They returned in 2021 with a left toe ulcer.  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 He underwent stenting of a right common and external iliac stenosis. Then on 11/13/2019 he had a right to left femoral-femoral bypass graft followed by left femoral to below-knee popliteal artery bypass graft with saphenous vein. He was taken back to the operating room on 9/23 due to a bypass graft stenosis which was treated with balloon angioplasty.  The patient returned for surveillance ultrasound imaging in March of 2022,which showed that the femoral-femoral graft and the femoral-popliteal graft are occluded.  He is having symptoms of severe claudication/rest pain and he has a new wound on the lateral aspect of his foot.  The original ulcer on the top of his great toe has healed.  Therefore on 05/08/2020 he underwent an aortobifemoral bypass graft using a 16 x 8 bifurcated graft and a redo left femoral to below-knee popliteal bypass.  CURRENT MEDICATIONS:    Current Outpatient Medications  Medication Sig Dispense Refill  . acetaminophen (TYLENOL) 500 MG tablet Take 2 tablets (1,000 mg  total) by mouth every 6 (six) hours as needed for mild pain or headache. 100 tablet 0  . APPLE CIDER VINEGAR PO Take 1 tablet by mouth at bedtime.    Marland Kitchen atorvastatin (LIPITOR) 40 MG tablet TAKE 1 TABLET (40 MG TOTAL) BY MOUTH DAILY. 90 tablet 2  . buPROPion (WELLBUTRIN XL) 150 MG 24 hr tablet TAKE 1 TABLET (150 MG TOTAL) BY MOUTH EVERY MORNING. 90 tablet 1  . clopidogrel (PLAVIX) 75 MG tablet TAKE 1 TABLET (75 MG TOTAL) BY MOUTH DAILY. 90 tablet 2  . gabapentin (NEURONTIN) 100 MG capsule TAKE 1 CAPSULE (100 MG) IN THE MORNING AND 2 CAPSULE (200 MG) AT BEDTIME 90 capsule 2  . Homeopathic Products (LEG CRAMPS PM SL) Place 1 tablet under the tongue at bedtime.    . lactose free nutrition (BOOST) LIQD Take 237 mLs by mouth 2 (two) times daily between meals.    . Varenicline Tartrate (CHANTIX STARTING MONTH PAK PO) Take 0.5 mg by mouth daily.     No current facility-administered medications for this visit.    REVIEW OF SYSTEMS:   [X]  denotes positive finding, [ ]  denotes negative finding Cardiac  Comments:  Chest pain or chest pressure:    Shortness of breath upon exertion:    Short of breath when lying flat:    Irregular heart rhythm:    Constitutional    Fever or chills:      PHYSICAL EXAM:   Vitals:   06/15/20 0834  BP: (!) 146/93  Pulse: 90  Resp: 20  Temp: 98.2 F (36.8 C)  SpO2: 98%  Weight: 140 lb (  63.5 kg)  Height: 5' 6"  (1.676 m)    GENERAL: The patient is a well-nourished male, in no acute distress. The vital signs are documented above. CARDIOVASCULAR: There is a regular rate and rhythm. PULMONARY: Non-labored respirations 1 cm ulcer on the lateral aspect of the foot which is dry and appears to be healing. DP and PT Doppler signals Fairly significant left leg edema from the knee down.  His groin incisions are healed without drainage as is his abdominal incision  STUDIES:      MEDICAL ISSUES:   Status post aortobifemoral bypass graft and redo left femoral  below-knee popliteal bypass: Overall patient is recovering nicely.  His bypass grafts are patent.  The ulcer on his big toe remains healed.  There is an ulcer on the lateral aspect of the foot that is healing.  Today, I am giving him a work excuse through May 18 which will be 2 months.  He will follow-up on May 16 for evaluation as to whether or not he can go back to light duty.  I am getting get him into compression stockings to help with his swelling.  Leia Alf, MD, FACS [facility] [phone] Pager [phone]

## 2020-06-30 ENCOUNTER — Telehealth: Payer: Self-pay | Admitting: Nurse Practitioner

## 2020-06-30 NOTE — Telephone Encounter (Signed)
Pt left VM wanting to schedule appt. Called pt back no answer could not leave VM.

## 2020-07-06 ENCOUNTER — Ambulatory Visit (INDEPENDENT_AMBULATORY_CARE_PROVIDER_SITE_OTHER): Payer: Self-pay | Admitting: Surgery

## 2020-07-06 ENCOUNTER — Encounter: Payer: Self-pay | Admitting: Surgery

## 2020-07-06 ENCOUNTER — Other Ambulatory Visit: Payer: Self-pay

## 2020-07-06 VITALS — BP 145/96 | HR 86 | Temp 98.3°F | Resp 20 | Ht 66.0 in | Wt 140.0 lb

## 2020-07-06 DIAGNOSIS — I7025 Atherosclerosis of native arteries of other extremities with ulceration: Secondary | ICD-10-CM

## 2020-07-06 MED ORDER — ATORVASTATIN CALCIUM 40 MG PO TABS: 40.0000 mg | ORAL_TABLET | Freq: Every day | ORAL | 12 refills | Status: DC

## 2020-07-06 MED ORDER — CLOPIDOGREL BISULFATE 75 MG PO TABS: 75.0000 mg | ORAL_TABLET | Freq: Every day | ORAL | 11 refills | Status: DC

## 2020-07-06 NOTE — Progress Notes (Signed)
Patient name: Benjamin [name] MRN: [id number] DOB: [birth year] Sex: male  REASON FOR VISIT:     post op  HISTORY OF PRESENT ILLNESS:    Benjamin [name] a 60y.o.malewith severe PAD,who I met in 2018. He was having 1 block claudication. 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 There is diffuse disease throughout the right superficial femoral and popliteal artery with occlusion of the distal popliteal artery and reconstitution of the posterior tibial artery. He ultimately decided to not pursue surgical revascularization at that time.  They returned in 2021 with a left toe ulcer. 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 He underwent stenting of a right common and external iliac stenosis. Then on 11/13/2019 he had a right to left femoral-femoral bypass graft followed by left femoral to below-knee popliteal artery bypass graft with saphenous vein. He was taken back to the operating room on 9/23 due to a bypass graft stenosis which was treated with balloon angioplasty.  The patient returned for surveillance ultrasound imaging in March of 2022,which showed that the femoral-femoral graft and the femoral-popliteal graft were occluded. He was having symptoms of severe claudication/rest pain and he had a new wound on the lateral aspect of his foot. The original ulcer on the top of his great toe has healed.  Therefore on 05/08/2020 he underwent an aortobifemoral bypass graft using a 16 x 8 bifurcated graft and a redo left femoral to below-knee popliteal bypass with PTFE.  He is eager to get back to work.  He still has swelling in his left leg.  He has not been wearing his compression   CURRENT MEDICATIONS:     Current Outpatient Medications  Medication Sig Dispense Refill  . acetaminophen (TYLENOL) 500 MG tablet Take 2 tablets (1,000 mg total) by mouth every 6 (six) hours as needed for mild pain or headache. 100 tablet 0  . APPLE CIDER VINEGAR PO Take 1 tablet by mouth at bedtime.    Marland Kitchen atorvastatin (LIPITOR) 40 MG tablet Take 1 tablet (40 mg total) by mouth daily. 90 tablet 12  . buPROPion (WELLBUTRIN XL) 150 MG 24 hr tablet TAKE 1 TABLET (150 MG TOTAL) BY MOUTH EVERY MORNING. 90 tablet 1  . clopidogrel (PLAVIX) 75 MG tablet Take 1 tablet (75 mg total) by mouth daily. 90 tablet 11  . gabapentin (NEURONTIN) 100 MG capsule TAKE 1 CAPSULE (100 MG) IN THE MORNING AND 2 CAPSULE (200 MG) AT BEDTIME 90 capsule 2  . Homeopathic Products (LEG CRAMPS PM SL) Place 1 tablet under the tongue at bedtime.    . lactose free nutrition (BOOST) LIQD Take 237 mLs by mouth 2 (two) times daily between meals.    . Varenicline Tartrate (CHANTIX STARTING MONTH PAK PO) Take 0.5 mg by mouth daily.     No current facility-administered medications for this visit.    REVIEW OF SYSTEMS:   [X]  denotes positive finding, [ ]  denotes negative finding Cardiac  Comments:  Chest pain or chest pressure:    Shortness of breath upon exertion:    Short of breath when lying flat:    Irregular heart rhythm:    Constitutional    Fever or chills:      PHYSICAL EXAM:   Vitals:   07/06/20 1408  BP: (!) 145/96  Pulse: 86  Resp: 20  Temp: 98.3 F (36.8 C)  SpO2: 97%  Weight: 140 lb (63.5 kg)  Height: 5' 6"  (1.676 m)    GENERAL: The patient is a well-nourished male, in no acute distress. The vital signs are documented above. CARDIOVASCULAR: There is a regular rate and rhythm. PULMONARY: Non-labored respirations Wounds have healed on his left leg.  All incisions have healed  STUDIES:   None   MEDICAL ISSUES:   I am clearing the patient to go back to work on light duty with a 5 pound weight restriction for 3 months.   He will return in 3 months for ABIs and duplex of his left leg left carotid ultrasound.  I called him in prescriptions for Lipitor and Plavix as he has an aspirin allergy.  He has difficulty affording his medications.  I have reached out to Raquel Sarna to see if we can provide some assistance  Annamarie Major, IV, MD, FACS [facility] [phone] Pager [phone]

## 2020-07-07 ENCOUNTER — Telehealth: Payer: Self-pay | Admitting: Licensed Clinical Social Worker

## 2020-07-07 NOTE — Telephone Encounter (Signed)
Attempted to reach pt to discuss medication assistance. No answer at [phone], voicemail full. Will reattempt as able.   [name], MSW, LCSW [facility] Heart/Vascular Care Navigation  [phone]

## 2020-07-08 ENCOUNTER — Other Ambulatory Visit: Payer: Self-pay | Admitting: *Deleted

## 2020-07-08 ENCOUNTER — Telehealth: Payer: Self-pay | Admitting: Licensed Clinical Social Worker

## 2020-07-08 DIAGNOSIS — I739 Peripheral vascular disease, unspecified: Secondary | ICD-10-CM

## 2020-07-08 DIAGNOSIS — I7025 Atherosclerosis of native arteries of other extremities with ulceration: Secondary | ICD-10-CM

## 2020-07-08 DIAGNOSIS — I70229 Atherosclerosis of native arteries of extremities with rest pain, unspecified extremity: Secondary | ICD-10-CM

## 2020-07-08 NOTE — Telephone Encounter (Signed)
LCSW has attempted a second time to reach pt at [phone] for medication assistance. No answer, no ring this time and voicemail full. Will reattempt again as able. Pt is Medicaid pending w/ Metallurgist, caseworker at [facility].   Benjamin [name], MSW, LCSW [facility] Heart/Vascular Care Navigation  [phone]

## 2020-07-09 ENCOUNTER — Telehealth: Payer: Self-pay | Admitting: Licensed Clinical Social Worker

## 2020-07-09 NOTE — Telephone Encounter (Signed)
Third attempt to reach pt, no answer and voicemail still full. Will attempt to reach pt sister and get connected w/ pt.   [name], MSW, LCSW [facility] Heart/Vascular Care Navigation  [phone]

## 2020-07-10 ENCOUNTER — Telehealth: Payer: Self-pay | Admitting: Licensed Clinical Social Worker

## 2020-07-10 NOTE — Telephone Encounter (Signed)
LCSW attempted home phone (also listed as pt sister phone), reached [facility]. Introduced self, role, and inquired if [place] was home. [name] shares that pt just stepped out, she will try and get in touch with him to call me back. She took down my contact information and will get him in touch with me. She shared with me without prompting that pt has filed for [facility] and Disability (confirmed w/financial counseling it was submitted).  Pt will be able to apply for NCMedAssist as currently has no prescription coverage.   [name], MSW, LCSW [facility] Heart/Vascular Care Navigation  [phone]

## 2020-07-10 NOTE — Progress Notes (Signed)
Heart and Vascular Care Navigation  07/10/2020  Benjamin [name] November 27, 1960 [id number]  Reason for Referral:  Engaged with patient by telephone for initial visit for Heart and Vascular Care Coordination.                                                                                                   Assessment:   LCSW spoke with pt via telephone- he returned my call from [phone]. Introduced self, role, reason for call. Pt confirmed home address, PCP, and that he currently is waiting on determination from Medicaid/Disability. He shares he likely will not be able to return to work (previously worked at [employer]) since he has lifting restrictions. Pt lives with his sister and additional relatives (nephew and his girlfriend).   Pt has been having ongoing difficulty affording medications. He also previously helped his sister out with housing/utilities which he cannot do any longer due to being out of work. Pt open for any assistance he may be eligible for at this time. LCSW shared that at this time bc his Medicaid is pending he is not eligible to apply for [employer] or [employer]. If he is denied then we can proceed with those. He is eligible potentially for NCMedAssist at this time. Pt aware and will complete application if sent to him. I also offered that pt may find lower costs of medications should he utilize a [employer].   I have sent him NCMedAssist application, utility/rent assistance programs, and information about [facility] pharmacies. Also sent pt my card.                                    HRT/VAS Care Coordination    Patients Home Cardiology Office VVS   Outpatient Care Team Social Worker   Social Worker Name: [name] [place], [phone]   Living arrangements for the past 2 months Single Family Home   Lives with: Siblings; Relatives   Patient Current Insurance Coverage Medicaid Pending  submitted by [employer] 06/2020   Patient Has Concern With  Paying Medical Bills Yes   Patient Concerns With Medical Bills past hospitalizations/procedures   Medical Bill Referrals: pt currently Medicaid pending   Does Patient Have Prescription Coverage? No   Patient Prescription Assistance Programs [place] [facility]   [place] [facility] Medications mailed pt NCMedAssist application and information about [facility] Pharmacies   Home Assistive Devices/Equipment None   DME Agency [facility]   [facility] Agency [facility]   Current home services DME  RW, W/C      Social History:  SDOH Screenings   Alcohol Screen: Not on file  Depression (ZSW1-0): Not on file  Financial Resource Strain: High Risk  . Difficulty of Paying Living Expenses: Hard  Food Insecurity: No Food Insecurity  . Worried About Programme researcher, broadcasting/film/video in the Last Year: Never true  . Ran Out of Food in the Last Year: Never true  Housing: Low Risk   . Last Housing Risk Score: 0  Physical Activity: Not on file  Social Connections: Not on file  Stress: Not on file  Tobacco Use: High Risk  . Smoking Tobacco Use: Current Every Day Smoker  . Smokeless Tobacco Use: Never Used  Transportation Needs: No Transportation Needs  . Lack of Transportation (Medical): No  . Lack of Transportation (Non-Medical): No    SDOH Interventions: Financial Resources:  Financial Strain Interventions: Other (Comment) (mailed pt NCMedAssist, utility/rental assistance)  Food Insecurity:  Food Insecurity Interventions: Intervention Not Indicated (pt currently recieving SNAP)  Housing Insecurity:  Housing Interventions: Other (Comment) (pt did express that he had been helping his sister with rent- mailed information about rent/utlity assistance programs)  Transportation:   Transportation Interventions: Intervention Not Indicated    Other Care Navigation Interventions:     Provided Pharmacy assistance resources [place] [facility]   Follow-up plan:    LCSW will f/u next week on resources sent to pt. He has my number should any additional concerns or questions that may arise prior to my f/u call.

## 2020-07-14 ENCOUNTER — Other Ambulatory Visit: Payer: Self-pay

## 2020-07-14 ENCOUNTER — Telehealth: Payer: Self-pay | Admitting: Licensed Clinical Social Worker

## 2020-07-14 ENCOUNTER — Telehealth: Payer: Self-pay

## 2020-07-14 MED ORDER — GABAPENTIN 100 MG PO CAPS
ORAL_CAPSULE | ORAL | 2 refills | Status: DC
  Filled 2020-07-14: qty 90, 30d supply, fill #0

## 2020-07-14 MED ORDER — BUPROPION HCL ER (XL) 150 MG PO TB24
1.0000 | ORAL_TABLET | Freq: Every morning | ORAL | 1 refills | Status: DC
Start: 2020-07-14 — End: 2020-12-15
  Filled 2020-07-14: qty 90, 90d supply, fill #0

## 2020-07-14 MED ORDER — CLOPIDOGREL BISULFATE 75 MG PO TABS
75.0000 mg | ORAL_TABLET | Freq: Every day | ORAL | 3 refills | Status: DC
  Filled 2020-07-14: qty 30, 30d supply, fill #0

## 2020-07-14 NOTE — Telephone Encounter (Signed)
[name], [place] with [facility] called to request pt's prescriptions from our office get transferred to [facility].I have let her know we did this; no further questions/concerns at this time.

## 2020-07-14 NOTE — Telephone Encounter (Signed)
LCSW received call from pt sister [name]. She shares pt upset as he received a call from the pharmacy and the medication was too pricey for him to afford at this time. Pt sister states he has Medicaid and hasnt gotten a card. LCSW shared that pt does not currently have Medicaid- rather it is pending as is his disability application. Shared it may take 60-90 days to process this determination. Pt sister shares she now understands. She does have his caseworker's number at DSS and name. I shared that I would call the pharmacy and assess what the cost is and if we needed to get the medications transferred.   I also shared that pt should monitor the mail and return any calls from DSS or DDS. I explained that the medication assistance packet I had sent would be able to help pt with his medications while Medicaid is pending. Pt sister states understanding and will f/u with pt.   LCSW called [facility], pt medication will cost around $169 for 90 days. I then called [facility] pharmacy however they are closed for lunch. LCSW will reattempt later today. I then called [facility] and left a message for RN to see if we can get medications transitioned to [facility] pharmacy so we can financially assist until [facility] application complete.    [name], MSW, LCSW [facility] Heart/Vascular Care Navigation  [phone]

## 2020-07-14 NOTE — Telephone Encounter (Signed)
Spoke with pt sister Benjamin [name] (DPR on file) at [phone]. Shared that medications were successfully transitioned to [name] where they can be filled more affordably. Offered financial assistance w/ medications until assistance applications can be completed. Pt sister expressed gratitude for assistance. Medications will be ready for pt pick up at [name]. They will call me to complete NCMedAssist application once received.   [name], MSW, LCSW [facility] Heart/Vascular Care Navigation  [phone]

## 2020-07-14 NOTE — Telephone Encounter (Signed)
LCSW called and attempted to reach pt at [phone]. No answer, voicemail full. However, pt returned my call. He shares that he has not yet received the paperwork sent last week. I let pt know I would call him at the end of the week but if he receives it before I call him he can give me a call and we can go over paperwork together. Remain available as needed.   Benjamin [name], MSW, LCSW [facility] Heart/Vascular Care Navigation  [phone]

## 2020-07-15 ENCOUNTER — Telehealth (HOSPITAL_COMMUNITY): Payer: Self-pay

## 2020-07-15 ENCOUNTER — Other Ambulatory Visit: Payer: Self-pay

## 2020-07-15 ENCOUNTER — Telehealth: Payer: Self-pay | Admitting: Licensed Clinical Social Worker

## 2020-07-15 NOTE — Telephone Encounter (Signed)
LCSW received call from pt- he confirmed he needs to pick up the medications at the [facility] pharmacy. Pt also encouraged again to keep his eyes open for [facility] application mailed to pt last week. Pt states he will and is heading to pick up his medications.   [name], MSW, LCSW [facility] Heart/Vascular Care Navigation  [phone]

## 2020-07-15 NOTE — Telephone Encounter (Signed)
07/15/20 Patient's FMLA forms were filled out and handed to the patient at the front desk.    [employer]

## 2020-07-17 ENCOUNTER — Telehealth: Payer: Self-pay | Admitting: Licensed Clinical Social Worker

## 2020-07-17 NOTE — Telephone Encounter (Signed)
LCSW called to check again to see if assistance applications received. Pt sister answers home phone, she shares that pt has hired a Clinical research associate to assist with his disability claim- he also has not yet received the [facility] but did not have any issues picking up his meds yesterday.   LCSW shared that if he does not receive the application this weekend we may need to schedule a time to meet in the office to complete assistance applications. Pt and pt sister interested in this; they are aware that office is closed Monday.    [name], MSW, LCSW [facility] Heart/Vascular Care Navigation  [phone]

## 2020-07-21 ENCOUNTER — Telehealth: Payer: Self-pay | Admitting: Licensed Clinical Social Worker

## 2020-07-21 NOTE — Telephone Encounter (Signed)
LCSW f/u with pt again- pt sister answered on home phone [phone]). She shares still no mail with assistance packet has arrived. LCSW shared that if it does not come today that I can meet with pt tomorrow afternoon to go over that paperwork. She will give me a call to set up a time after the mail arrives today.   [name], MSW, LCSW [facility] Heart/Vascular Care Navigation  [phone]

## 2020-07-22 ENCOUNTER — Other Ambulatory Visit: Payer: Self-pay

## 2020-07-22 ENCOUNTER — Telehealth: Payer: Self-pay | Admitting: Licensed Clinical Social Worker

## 2020-07-22 ENCOUNTER — Ambulatory Visit (INDEPENDENT_AMBULATORY_CARE_PROVIDER_SITE_OTHER): Payer: Medicaid Other | Admitting: Nurse Practitioner

## 2020-07-22 ENCOUNTER — Encounter: Payer: Self-pay | Admitting: Nurse Practitioner

## 2020-07-22 VITALS — BP 142/90 | HR 96 | Temp 98.7°F | Ht 67.4 in | Wt 142.6 lb

## 2020-07-22 DIAGNOSIS — Z720 Tobacco use: Secondary | ICD-10-CM

## 2020-07-22 DIAGNOSIS — Z0001 Encounter for general adult medical examination with abnormal findings: Secondary | ICD-10-CM | POA: Diagnosis not present

## 2020-07-22 DIAGNOSIS — E785 Hyperlipidemia, unspecified: Secondary | ICD-10-CM

## 2020-07-22 DIAGNOSIS — N281 Cyst of kidney, acquired: Secondary | ICD-10-CM

## 2020-07-22 DIAGNOSIS — R351 Nocturia: Secondary | ICD-10-CM

## 2020-07-22 DIAGNOSIS — E559 Vitamin D deficiency, unspecified: Secondary | ICD-10-CM

## 2020-07-22 DIAGNOSIS — I10 Essential (primary) hypertension: Secondary | ICD-10-CM | POA: Diagnosis not present

## 2020-07-22 DIAGNOSIS — Z0000 Encounter for general adult medical examination without abnormal findings: Secondary | ICD-10-CM

## 2020-07-22 DIAGNOSIS — Z1211 Encounter for screening for malignant neoplasm of colon: Secondary | ICD-10-CM

## 2020-07-22 DIAGNOSIS — Z23 Encounter for immunization: Secondary | ICD-10-CM

## 2020-07-22 MED ORDER — SHINGRIX 50 MCG/0.5ML IM SUSR: 0.5000 mL | Freq: Once | INTRAMUSCULAR | 0 refills | Status: AC

## 2020-07-22 MED ORDER — TETANUS-DIPHTH-ACELL PERTUSSIS 5-2.5-18.5 LF-MCG/0.5 IM SUSP: 0.5000 mL | Freq: Once | INTRAMUSCULAR | 0 refills | Status: AC

## 2020-07-22 NOTE — Progress Notes (Signed)
Heart and Vascular Care Navigation  07/22/2020  Benjamin [name] Mar 26, 1960 [id number]  Reason for Referral:  Assistance w/ medication access. Engaged with patient face to face for follow up visit for Heart and Vascular Care Coordination.                                                                                                   Assessment:        LCSW met with pt at [facility] clinic this afternoon to assist with completion of NCMedAssist application. The following medications are available for coverage under MedAssist if pt eligible: atorvastatin, bupropion, clopidogrel, varenicline tartrate. The other medications can be picked up at low cost from [place] which has been shared w/ pt and pt sister: acetaminophen, gabapentin.        LCSW has made pt copies of application, we are still in need of additional documentation before I can turn application in for pt.   LCSW also again reviewed who is responsible for managing pt Medicaid and Disability applications and encouraged pt to f/u with those individuals for any updates.                         HRT/VAS Care Coordination    Patients Home Cardiology Office VVS   Outpatient Care Team Social Worker   Social Worker Name: [name] [place], [phone]   Living arrangements for the past 2 months Single Family Home   Lives with: Siblings; Relatives   Patient Current Insurance Coverage Medicaid Pending  submitted by [employer] 06/2020   Patient Has Concern With Paying Medical Bills Yes   Patient Concerns With Medical Bills past hospitalizations/procedures   Medical Bill Referrals: pt currently Medicaid pending   Does Patient Have Prescription Coverage? No   Patient Prescription Assistance Programs [place] [facility]   [place] [facility] Medications mailed pt NCMedAssist application and information about [facility] Pharmacies   Home Assistive Devices/Equipment None   DME Agency [facility]   [place]    Current home services DME  RW, W/C      Social History:                                                                             SDOH Screenings   Alcohol Screen: Not on file  Depression (PHQ2-9): Low Risk   . PHQ-2 Score: 0  Financial Resource Strain: High Risk  . Difficulty of Paying Living Expenses: Hard  Food Insecurity: No Food Insecurity  . Worried About Charity fundraiser in the Last Year: Never true  . Ran Out of Food in the Last Year: Never true  Housing: Low Risk   . Last Housing Risk Score: 0  Physical Activity: Not on file  Social Connections:  Not on file  Stress: Not on file  Tobacco Use: High Risk  . Smoking Tobacco Use: Current Every Day Smoker  . Smokeless Tobacco Use: Never Used  Transportation Needs: No Transportation Needs  . Lack of Transportation (Medical): No  . Lack of Transportation (Non-Medical): No    SDOH Interventions: Financial Resources:  Financial Strain Interventions: Other (Comment) (assisted pt with completing NCMedAssist application)     Other Care Navigation Interventions:     Provided Pharmacy assistance resources LCSW assisted w/ pt with completing NCMedAssist application   Follow-up plan:   LCSW will f/u with pt in a week to see if able to obtain additional paperwork needed for application. LCSW remains available for any additional questions/concerns. I also received a message from pt PCP inquiring about pt insurance. I have shared that pt has Medicaid and Disability pending but that it may take 90+ days to receive a determination.

## 2020-07-22 NOTE — Patient Instructions (Signed)

## 2020-07-22 NOTE — Progress Notes (Signed)
I,Yamilka [employer] as a Education administrator for [employer], FNP.,have documented all relevant documentation on the behalf of [name], FNP,as directed by  [name], FNP while in the presence of [name], [place]. This visit occurred during the SARS-CoV-2 public health emergency.  Safety protocols were in place, including screening questions prior to the visit, additional usage of staff PPE, and extensive cleaning of exam room while observing appropriate contact time as indicated for disinfecting solutions.  Subjective:     Patient ID: Benjamin [name] , male    DOB: Oct 11, 1960 , 60 y.o.   MRN: [id number]   Chief Complaint  Patient presents with   Annual Exam    HPI  Patient here for hm.      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   Bilateral femoral artery disease     Family History  Problem Relation Age of Onset   Arthritis Mother    COPD Father    Stroke Father    Multiple sclerosis Sister      Current Outpatient Medications:    acetaminophen (TYLENOL) 500 MG tablet, Take 2 tablets (1,000 mg total) by mouth every 6 (six) hours as needed for mild pain or headache., Disp: 100 tablet, Rfl: 0   APPLE CIDER VINEGAR PO, Take 1 tablet by mouth at bedtime., Disp: , Rfl:    atorvastatin (LIPITOR) 40 MG tablet, Take 1 tablet (40 mg total) by mouth daily., Disp: 90 tablet, Rfl: 12   buPROPion (WELLBUTRIN XL) 150 MG 24 hr tablet, TAKE 1 TABLET (150 MG TOTAL) BY MOUTH EVERY MORNING., Disp: 90 tablet, Rfl: 1   clopidogrel (PLAVIX) 75 MG tablet, Take 1 tablet (75 mg total) by mouth daily., Disp: 90 tablet, Rfl: 3   gabapentin (NEURONTIN) 100 MG capsule, TAKE 1 CAPSULE (100 MG) IN THE MORNING AND 2 CAPSULE (200 MG) AT BEDTIME, Disp: 90 capsule, Rfl: 2   Homeopathic Products (LEG CRAMPS PM SL), Place 1 tablet under the tongue at bedtime., Disp: , Rfl:     lactose free nutrition (BOOST) LIQD, Take 237 mLs by mouth 2 (two) times daily between meals., Disp: , Rfl:    Varenicline Tartrate (CHANTIX STARTING MONTH PAK PO), Take 0.5 mg by mouth daily., Disp: , Rfl:    Allergies  Allergen Reactions   Aspirin Nausea And Vomiting   Garlic Other (See Comments)    sneezing     Men's preventive visit. Patient Health Questionnaire (PHQ-2) is  [place] Office Visit from 07/22/2020 in [facility]  PHQ-2 Total Score 0      Patient is on a regular diet this include fried and fatty foods.  Exercises 3 days a week.  Marital status: Single. Relevant history for alcohol use is:  Social History   Substance and Sexual Activity  Alcohol Use No   Relevant history for tobacco use is:  Social History   Tobacco Use  Smoking Status Every Day   Packs/day: 1.50   Years: 29.00   Pack years: 43.50   Types: Cigarettes  Smokeless Tobacco Never  Tobacco Comments   he is down to 1 PPD - 07/22/2020  .   Review of Systems  Constitutional: Negative.   HENT: Negative.    Eyes: Negative.   Respiratory: Negative.    Cardiovascular: Negative.  Negative for chest pain, palpitations  and leg swelling.  Gastrointestinal: Negative.   Endocrine: Negative.   Genitourinary: Negative.   Musculoskeletal: Negative.   Skin: Negative.   Neurological: Negative.   Hematological: Negative.   Psychiatric/Behavioral: Negative.      Today's Vitals   07/22/20 1030  BP: (!) 142/90  Pulse: 96  Temp: 98.7 F (37.1 C)  Weight: 142 lb 9.6 oz (64.7 kg)  Height: 5' 7.4" (1.712 m)  PainSc: 6   PainLoc: Leg   Body mass index is 22.07 kg/m.   Objective:  Physical Exam Vitals reviewed.  Constitutional:      General: He is not in acute distress.    Appearance: Normal appearance.  HENT:     Head: Normocephalic and atraumatic.     Right Ear: Tympanic membrane, ear canal and external ear normal. There is no impacted cerumen.     Left Ear: Tympanic  membrane, ear canal and external ear normal. There is no impacted cerumen.  Cardiovascular:     Rate and Rhythm: Normal rate and regular rhythm.     Pulses: Normal pulses.     Heart sounds: Normal heart sounds. No murmur heard. Pulmonary:     Effort: Pulmonary effort is normal. No respiratory distress.     Breath sounds: Normal breath sounds. No wheezing.  Abdominal:     General: Abdomen is flat. Bowel sounds are normal. There is no distension.     Palpations: Abdomen is soft.  Genitourinary:    Prostate: Normal.     Rectum: Guaiac result negative.  Musculoskeletal:        General: Swelling (left lower extremity) and tenderness (left lower extremity swelling and tenderness) present. Normal range of motion.     Cervical back: Normal range of motion and neck supple.  Skin:    General: Skin is warm.     Capillary Refill: Capillary refill takes less than 2 seconds.  Neurological:     General: No focal deficit present.     Mental Status: He is alert and oriented to person, place, and time.     Cranial Nerves: No cranial nerve deficit.     Motor: No weakness.  Psychiatric:        Mood and Affect: Mood normal.        Behavior: Behavior normal.        Thought Content: Thought content normal.        Judgment: Judgment normal.        Assessment And Plan:    1. Encounter for general adult medical examination w/o abnormal findings Behavior modifications discussed and diet history reviewed.   Pt will continue to exercise regularly and modify diet with low GI, plant based foods and decrease intake of processed foods.  Recommend intake of daily multivitamin, Vitamin D, and calcium.  Recommend colonoscopy for preventive screenings, as well as recommend immunizations that include influenza, TDAP and shingles - Vitamin D (25 hydroxy) - Hemoglobin A1c  2. Primary hypertension Chronic, slightly elevated today however no changes to medications He is also followed by Cardiology - POCT  Urinalysis Dipstick (81002) - POCT UA - Microalbumin - CMP14+EGFR - CBC no Diff  3. Vitamin D deficiency Will check vitamin D level and supplement as needed.    Also encouraged to spend 15 minutes in the sun daily.   4. Dyslipidemia, goal LDL below 70 Chronic, continue with current medications - Lipid panel  5. Acquired cyst of kidney This was found incidentally with another abdomen imaging Will check MRI for further  evaluation - MR ABDOMEN WO CONTRAST; Future  6. Nocturia - PSA  7. Encounter for immunization Rx sent to pharmacy for TDAP and shingles will be administered to adults 102-28 years old every 10 years. - Tdap (BOOSTRIX) 5-2.5-18.5 LF-MCG/0.5 injection; Inject 0.5 mLs into the muscle once for 1 dose.  Dispense: 0.5 mL; Refill: 0 - Zoster Vaccine Adjuvanted [facility]) injection; Inject 0.5 mLs into the muscle once for 1 dose.  Dispense: 0.5 mL; Refill: 0  8. Encounter for screening colonoscopy According to USPTF Colorectal cancer Screening guidelines. Colonoscopy is recommended every 10 years, starting at age 16years. Will refer to GI for colon cancer screening. - Ambulatory referral to Gastroenterology     Patient was given opportunity to ask questions. Patient verbalized understanding of the plan and was able to repeat key elements of the plan. All questions were answered to their satisfaction.   [name], FNP   I, [name], FNP, have reviewed all documentation for this visit. The documentation on 07/22/20 for the exam, diagnosis, procedures, and orders are all accurate and complete.   THE PATIENT IS ENCOURAGED TO PRACTICE SOCIAL DISTANCING DUE TO THE COVID-19 PANDEMIC.

## 2020-07-22 NOTE — Telephone Encounter (Signed)
LCSW spoke with pt sister, they did receive the paperwork. There is still some confusion as to how to complete this- I offered to go over it with pt and his sister today. They are agreeable, will come to office today at 1:30pm. They have my number for any additional questions/concerns.   [name], MSW, LCSW [facility] Heart/Vascular Care Navigation  [phone]

## 2020-07-23 LAB — CMP14+EGFR
ALT: 7 IU/L (ref 0–44)
AST: 12 IU/L (ref 0–40)
Albumin/Globulin Ratio: 1.3 (ref 1.2–2.2)
Albumin: 4 g/dL (ref 3.8–4.8)
Alkaline Phosphatase: 167 IU/L — ABNORMAL HIGH (ref 44–121)
BUN/Creatinine Ratio: 18 (ref 10–24)
BUN: 14 mg/dL (ref 8–27)
Bilirubin Total: 0.3 mg/dL (ref 0.0–1.2)
CO2: 20 mmol/L (ref 20–29)
Calcium: 9.3 mg/dL (ref 8.6–10.2)
Chloride: 102 mmol/L (ref 96–106)
Creatinine, Ser: 0.8 mg/dL (ref 0.76–1.27)
Globulin, Total: 3.2 g/dL (ref 1.5–4.5)
Glucose: 91 mg/dL (ref 65–99)
Potassium: 4.6 mmol/L (ref 3.5–5.2)
Sodium: 140 mmol/L (ref 134–144)
Total Protein: 7.2 g/dL (ref 6.0–8.5)
eGFR: 101 mL/min/{1.73_m2} (ref >59)

## 2020-07-23 LAB — HEMOGLOBIN A1C
Est. average glucose Bld gHb Est-mCnc: 126 mg/dL
Hgb A1c MFr Bld: 6 % — ABNORMAL HIGH (ref 4.8–5.6)

## 2020-07-23 LAB — LIPID PANEL
Chol/HDL Ratio: 4.1 ratio (ref 0.0–5.0)
Cholesterol, Total: 219 mg/dL — ABNORMAL HIGH (ref 100–199)
HDL: 54 mg/dL (ref >39)
LDL Chol Calc (NIH): 147 mg/dL — ABNORMAL HIGH (ref 0–99)
Triglycerides: 99 mg/dL (ref 0–149)
VLDL Cholesterol Cal: 18 mg/dL (ref 5–40)

## 2020-07-23 LAB — CBC
Hematocrit: 37.4 % — ABNORMAL LOW (ref 37.5–51.0)
Hemoglobin: 12.2 g/dL — ABNORMAL LOW (ref 13.0–17.7)
MCH: 24.7 pg — ABNORMAL LOW (ref 26.6–33.0)
MCHC: 32.6 g/dL (ref 31.5–35.7)
MCV: 76 fL — ABNORMAL LOW (ref 79–97)
Platelets: 419 10*3/uL (ref 150–450)
RBC: 4.94 x10E6/uL (ref 4.14–5.80)
RDW: 15.8 % — ABNORMAL HIGH (ref 11.6–15.4)
WBC: 7.3 10*3/uL (ref 3.4–10.8)

## 2020-07-23 LAB — PSA: Prostate Specific Ag, Serum: 0.4 ng/mL (ref 0.0–4.0)

## 2020-07-23 LAB — VITAMIN D 25 HYDROXY (VIT D DEFICIENCY, FRACTURES): Vit D, 25-Hydroxy: 13.5 ng/mL — ABNORMAL LOW (ref 30.0–100.0)

## 2020-07-27 ENCOUNTER — Telehealth: Payer: Self-pay | Admitting: Licensed Clinical Social Worker

## 2020-07-27 NOTE — Telephone Encounter (Signed)
I received a call from pt and pt sister [name] [phone]). Questions about what was missing from application- at this time pt is missing tax documents (per his report he did file taxes this year). Pt and pt sister will work on getting a copy of that return (his niece assisted w/ completion online).   [name], MSW, LCSW [facility] Heart/Vascular Care Navigation  [phone]

## 2020-08-04 ENCOUNTER — Other Ambulatory Visit: Payer: Self-pay | Admitting: Nurse Practitioner

## 2020-08-04 DIAGNOSIS — N281 Cyst of kidney, acquired: Secondary | ICD-10-CM

## 2020-08-10 ENCOUNTER — Telehealth: Payer: Self-pay | Admitting: Licensed Clinical Social Worker

## 2020-08-10 NOTE — Progress Notes (Signed)
Heart and Vascular Care Navigation  08/10/2020  Benjamin [name] [birth year] [id number]  Reason for Referral:    Engaged with patient by telephone for follow up visit for Heart and Vascular Care Coordination.                                                                                                   Assessment:                                     LCSW called and spoke with pt sister via home telephone (DPR on file, pt in the back of the call). LCSW had received update that pt was approved for Medicaid per financial counselor Shanda Bumps. Pt and sister share that they received letters from DSS but had not gotten anything about approval. I encouraged pt to reach back out to caseworker with DSS to inquire if approved and to see if he can get letter of approval or cards mailed to him for pharmacy/upcoming medical appts.   HRT/VAS Care Coordination     Patients Home Cardiology Office VVS   Outpatient Care Team Social Worker   Social Worker Name: [name] [place], [phone]   Living arrangements for the past 2 months Single Family Home   Lives with: Siblings   Patient Current Insurance Coverage Medicaid   Patient Has Concern With Paying Medical Bills Yes   Patient Concerns With Medical Bills past hospitalizations/procedures   Medical Bill Referrals: pt currently Medicaid pending   Does Patient Have Prescription Coverage? Yes   Patient Prescription Assistance Programs Other   [place] Medassist Medications mailed pt NCMedAssist application and information about [facility]- no longer eligible for MedAssist if approved for Medicaid   Other Assistance Programs Medications encouraged pt to continue using [facility] pharmacy and to bring Medicaid cards   Home Assistive Devices/Equipment None   DME Agency [employer]   [facility] Agency [facility]   Current home services DME  RW, W/C       Social History:                                                                              SDOH Screenings   Alcohol Screen: Not on file  Depression (PHQ2-9): Low Risk    PHQ-2 Score: 0  Financial Resource Strain: High Risk   Difficulty of Paying Living Expenses: Hard  Food Insecurity: No Food Insecurity   Worried About Programme researcher, broadcasting/film/video in the Last Year: Never true   Ran Out of Food in the Last Year: Never true  Housing: Low Risk    Last Housing Risk Score: 0  Physical Activity: Not on file  Social Connections: Not on file  Stress:  Not on file  Tobacco Use: High Risk   Smoking Tobacco Use: Every Day   Smokeless Tobacco Use: Never  Transportation Needs: No Transportation Needs   Lack of Transportation (Medical): No   Lack of Transportation (Non-Medical): No    SDOH Interventions: Financial Resources:  Financial Strain Interventions: Other (Comment) (pt approved for Medicaid BCBS; still pending disability) Tree surgeon for Disability application assistance   Other Care Navigation Interventions:     Provided Pharmacy assistance resources Other- pt now has Medicaid, not eligible for NCMedAssist, encouraged to bring cards to upcoming appt    Follow-up plan:   Pt/pt sister will f/u if any additional questions- if approved for Medicaid (and confirmed w/ DSS) then pt should let PCP and supporting providers know and bring letter/cards to offices.

## 2020-08-16 ENCOUNTER — Encounter: Payer: Self-pay | Admitting: Nurse Practitioner

## 2020-08-17 ENCOUNTER — Other Ambulatory Visit: Payer: Self-pay

## 2020-08-17 ENCOUNTER — Encounter: Payer: Self-pay | Admitting: Surgery

## 2020-08-17 DIAGNOSIS — E559 Vitamin D deficiency, unspecified: Secondary | ICD-10-CM

## 2020-08-17 MED ORDER — VITAMIN D (ERGOCALCIFEROL) 1.25 MG (50000 UNIT) PO CAPS: 50000.0000 [IU] | ORAL_CAPSULE | ORAL | 0 refills | Status: DC

## 2020-08-20 ENCOUNTER — Telehealth: Payer: Self-pay | Admitting: Licensed Clinical Social Worker

## 2020-08-20 NOTE — Telephone Encounter (Signed)
Called home phone, reached pt sister [name] at [phone], (DPR on file). She confirmed that she and pt received his determination letter for Medicaid and it was approved. She will have pt bring determination letter with him to appointments until cards mailed. She shared they also called [employer] and were told that his determination letter will also be sent to them. Encouraged them to call me if any additional questions/concerns arise.   [name], MSW, LCSW [facility] Heart/Vascular Care Navigation  [phone]

## 2020-08-21 ENCOUNTER — Other Ambulatory Visit: Payer: Self-pay

## 2020-08-21 ENCOUNTER — Ambulatory Visit
Admission: RE | Admit: 2020-08-21 | Discharge: 2020-08-21 | Disposition: A | Discharge status: Home / Self Care | Payer: Medicaid Other | Source: Ambulatory Visit | Attending: Nurse Practitioner | Admitting: Nurse Practitioner

## 2020-08-21 DIAGNOSIS — N281 Cyst of kidney, acquired: Secondary | ICD-10-CM

## 2020-08-21 MED ORDER — GADOBENATE DIMEGLUMINE 529 MG/ML IV SOLN
13.0000 mL | Freq: Once | INTRAVENOUS | Status: AC | PRN
  Administered 2020-08-21: 13 mL via INTRAVENOUS

## 2020-08-28 ENCOUNTER — Telehealth: Payer: Self-pay

## 2020-08-28 NOTE — Telephone Encounter (Signed)
-----   Message from Arnette Felts, FNP sent at 08/25/2020  2:13 PM EDT ----- You have small benign cyst to your kidneys, we typically watch this.

## 2020-08-28 NOTE — Telephone Encounter (Signed)
Mailbox full

## 2020-09-04 ENCOUNTER — Telehealth: Payer: Self-pay

## 2020-09-05 ENCOUNTER — Encounter (HOSPITAL_COMMUNITY): Payer: Self-pay | Admitting: Emergency Medicine

## 2020-09-05 ENCOUNTER — Emergency Department (HOSPITAL_COMMUNITY): Payer: Medicaid Other

## 2020-09-05 ENCOUNTER — Other Ambulatory Visit: Payer: Self-pay

## 2020-09-05 ENCOUNTER — Inpatient Hospital Stay (HOSPITAL_COMMUNITY)
Admission: EM | Admit: 2020-09-05 | Discharge: 2020-09-11 | DRG: 253 | Disposition: A | Discharge status: Home Health | Payer: Medicaid Other | Attending: Vascular Surgery | Admitting: Vascular Surgery

## 2020-09-05 DIAGNOSIS — T827XXA Infection and inflammatory reaction due to other cardiac and vascular devices, implants and grafts, initial encounter: Principal | ICD-10-CM | POA: Diagnosis present

## 2020-09-05 DIAGNOSIS — T82868A Thrombosis of vascular prosthetic devices, implants and grafts, initial encounter: Secondary | ICD-10-CM | POA: Diagnosis present

## 2020-09-05 DIAGNOSIS — Z820 Family history of epilepsy and other diseases of the nervous system: Secondary | ICD-10-CM | POA: Diagnosis not present

## 2020-09-05 DIAGNOSIS — B9561 Methicillin susceptible Staphylococcus aureus infection as the cause of diseases classified elsewhere: Secondary | ICD-10-CM | POA: Diagnosis present

## 2020-09-05 DIAGNOSIS — E785 Hyperlipidemia, unspecified: Secondary | ICD-10-CM | POA: Diagnosis present

## 2020-09-05 DIAGNOSIS — Z87891 Personal history of nicotine dependence: Secondary | ICD-10-CM

## 2020-09-05 DIAGNOSIS — T82392A Other mechanical complication of femoral arterial graft (bypass), initial encounter: Secondary | ICD-10-CM

## 2020-09-05 DIAGNOSIS — I10 Essential (primary) hypertension: Secondary | ICD-10-CM | POA: Diagnosis present

## 2020-09-05 DIAGNOSIS — Z825 Family history of asthma and other chronic lower respiratory diseases: Secondary | ICD-10-CM | POA: Diagnosis not present

## 2020-09-05 DIAGNOSIS — Z8261 Family history of arthritis: Secondary | ICD-10-CM

## 2020-09-05 DIAGNOSIS — Z823 Family history of stroke: Secondary | ICD-10-CM | POA: Diagnosis not present

## 2020-09-05 DIAGNOSIS — Y712 Prosthetic and other implants, materials and accessory cardiovascular devices associated with adverse incidents: Secondary | ICD-10-CM

## 2020-09-05 DIAGNOSIS — T8132XA Disruption of internal operation (surgical) wound, not elsewhere classified, initial encounter: Secondary | ICD-10-CM | POA: Diagnosis present

## 2020-09-05 DIAGNOSIS — I9751 Accidental puncture and laceration of a circulatory system organ or structure during a circulatory system procedure: Secondary | ICD-10-CM | POA: Diagnosis not present

## 2020-09-05 DIAGNOSIS — L02416 Cutaneous abscess of left lower limb: Secondary | ICD-10-CM

## 2020-09-05 DIAGNOSIS — Z20822 Contact with and (suspected) exposure to covid-19: Secondary | ICD-10-CM | POA: Diagnosis present

## 2020-09-05 DIAGNOSIS — I743 Embolism and thrombosis of arteries of the lower extremities: Secondary | ICD-10-CM | POA: Diagnosis present

## 2020-09-05 DIAGNOSIS — Y832 Surgical operation with anastomosis, bypass or graft as the cause of abnormal reaction of the patient, or of later complication, without mention of misadventure at the time of the procedure: Secondary | ICD-10-CM | POA: Diagnosis present

## 2020-09-05 DIAGNOSIS — I739 Peripheral vascular disease, unspecified: Secondary | ICD-10-CM | POA: Diagnosis present

## 2020-09-05 LAB — I-STAT CHEM 8, ED
BUN: 14 mg/dL (ref 8–23)
Calcium, Ion: 1.16 mmol/L (ref 1.15–1.40)
Chloride: 101 mmol/L (ref 98–111)
Creatinine, Ser: 0.9 mg/dL (ref 0.61–1.24)
Glucose, Bld: 101 mg/dL — ABNORMAL HIGH (ref 70–99)
HCT: 31 % — ABNORMAL LOW (ref 39.0–52.0)
Hemoglobin: 10.5 g/dL — ABNORMAL LOW (ref 13.0–17.0)
Potassium: 4.1 mmol/L (ref 3.5–5.1)
Sodium: 137 mmol/L (ref 135–145)
TCO2: 29 mmol/L (ref 22–32)

## 2020-09-05 LAB — CBC WITH DIFFERENTIAL/PLATELET
Abs Immature Granulocytes: 0.04 10*3/uL (ref 0.00–0.07)
Basophils Absolute: 0 10*3/uL (ref 0.0–0.1)
Basophils Relative: 0 %
Eosinophils Absolute: 0.1 10*3/uL (ref 0.0–0.5)
Eosinophils Relative: 1 %
HCT: 30.7 % — ABNORMAL LOW (ref 39.0–52.0)
Hemoglobin: 10.1 g/dL — ABNORMAL LOW (ref 13.0–17.0)
Immature Granulocytes: 0 %
Lymphocytes Relative: 16 %
Lymphs Abs: 1.5 10*3/uL (ref 0.7–4.0)
MCH: 23.2 pg — ABNORMAL LOW (ref 26.0–34.0)
MCHC: 32.9 g/dL (ref 30.0–36.0)
MCV: 70.6 fL — ABNORMAL LOW (ref 80.0–100.0)
Monocytes Absolute: 0.4 10*3/uL (ref 0.1–1.0)
Monocytes Relative: 4 %
Neutro Abs: 7.4 10*3/uL (ref 1.7–7.7)
Neutrophils Relative %: 79 %
Platelets: 446 10*3/uL — ABNORMAL HIGH (ref 150–400)
RBC: 4.35 MIL/uL (ref 4.22–5.81)
RDW: 16.7 % — ABNORMAL HIGH (ref 11.5–15.5)
WBC: 9.4 10*3/uL (ref 4.0–10.5)
nRBC: 0 % (ref 0.0–0.2)

## 2020-09-05 LAB — COMPREHENSIVE METABOLIC PANEL
ALT: 26 U/L (ref 0–44)
AST: 27 U/L (ref 15–41)
Albumin: 2.8 g/dL — ABNORMAL LOW (ref 3.5–5.0)
Alkaline Phosphatase: 114 U/L (ref 38–126)
Anion gap: 7 (ref 5–15)
BUN: 13 mg/dL (ref 8–23)
CO2: 28 mmol/L (ref 22–32)
Calcium: 8.8 mg/dL — ABNORMAL LOW (ref 8.9–10.3)
Chloride: 101 mmol/L (ref 98–111)
Creatinine, Ser: 0.85 mg/dL (ref 0.61–1.24)
GFR, Estimated: >60 mL/min (ref >60)
Glucose, Bld: 102 mg/dL — ABNORMAL HIGH (ref 70–99)
Potassium: 4.1 mmol/L (ref 3.5–5.1)
Sodium: 136 mmol/L (ref 135–145)
Total Bilirubin: <0.1 mg/dL — ABNORMAL LOW (ref 0.3–1.2)
Total Protein: 6.8 g/dL (ref 6.5–8.1)

## 2020-09-05 LAB — RESP PANEL BY RT-PCR (FLU A&B, COVID) ARPGX2
Influenza A by PCR: NEGATIVE
Influenza B by PCR: NEGATIVE
SARS Coronavirus 2 by RT PCR: NEGATIVE

## 2020-09-05 MED ORDER — BUPROPION HCL ER (XL) 150 MG PO TB24
150.0000 mg | ORAL_TABLET | Freq: Every morning | ORAL | Status: DC
  Administered 2020-09-06 – 2020-09-11 (×6): 150 mg via ORAL
  Filled 2020-09-05 (×6): qty 1

## 2020-09-05 MED ORDER — IOHEXOL 350 MG/ML SOLN
150.0000 mL | Freq: Once | INTRAVENOUS | Status: AC | PRN
  Administered 2020-09-05: 150 mL via INTRAVENOUS

## 2020-09-05 MED ORDER — ACETAMINOPHEN 325 MG RE SUPP
325.0000 mg | RECTAL | Status: DC | PRN
  Filled 2020-09-05: qty 2

## 2020-09-05 MED ORDER — METOPROLOL TARTRATE 5 MG/5ML IV SOLN: 2.0000 mg | INTRAVENOUS | Status: DC | PRN

## 2020-09-05 MED ORDER — DOCUSATE SODIUM 100 MG PO CAPS
100.0000 mg | ORAL_CAPSULE | Freq: Every day | ORAL | Status: DC
  Administered 2020-09-06 – 2020-09-10 (×4): 100 mg via ORAL
  Filled 2020-09-05 (×5): qty 1

## 2020-09-05 MED ORDER — LIDOCAINE HCL (PF) 1 % IJ SOLN
5.0000 mL | Freq: Once | INTRAMUSCULAR | Status: DC
  Filled 2020-09-05: qty 5

## 2020-09-05 MED ORDER — LABETALOL HCL 5 MG/ML IV SOLN: 10.0000 mg | INTRAVENOUS | Status: DC | PRN

## 2020-09-05 MED ORDER — ONDANSETRON HCL 4 MG/2ML IJ SOLN: 4.0000 mg | Freq: Four times a day (QID) | INTRAMUSCULAR | Status: DC | PRN

## 2020-09-05 MED ORDER — SODIUM CHLORIDE 0.9 % IV SOLN: 500.0000 mL | Freq: Once | INTRAVENOUS | Status: DC | PRN

## 2020-09-05 MED ORDER — ENOXAPARIN SODIUM 40 MG/0.4ML IJ SOSY
40.0000 mg | PREFILLED_SYRINGE | INTRAMUSCULAR | Status: DC
  Administered 2020-09-06 – 2020-09-11 (×5): 40 mg via SUBCUTANEOUS
  Filled 2020-09-05 (×5): qty 0.4

## 2020-09-05 MED ORDER — ATORVASTATIN CALCIUM 40 MG PO TABS
40.0000 mg | ORAL_TABLET | Freq: Every day | ORAL | Status: DC
  Administered 2020-09-05 – 2020-09-11 (×7): 40 mg via ORAL
  Filled 2020-09-05 (×7): qty 1

## 2020-09-05 MED ORDER — CLOPIDOGREL BISULFATE 75 MG PO TABS
75.0000 mg | ORAL_TABLET | Freq: Every day | ORAL | Status: DC
  Administered 2020-09-05 – 2020-09-11 (×7): 75 mg via ORAL
  Filled 2020-09-05 (×7): qty 1

## 2020-09-05 MED ORDER — VANCOMYCIN HCL 1500 MG/300ML IV SOLN
1500.0000 mg | Freq: Once | INTRAVENOUS | Status: AC
  Administered 2020-09-05: 1500 mg via INTRAVENOUS
  Filled 2020-09-05: qty 300

## 2020-09-05 MED ORDER — HYDRALAZINE HCL 20 MG/ML IJ SOLN: 5.0000 mg | INTRAMUSCULAR | Status: DC | PRN

## 2020-09-05 MED ORDER — VANCOMYCIN HCL 750 MG/150ML IV SOLN
750.0000 mg | Freq: Two times a day (BID) | INTRAVENOUS | Status: DC
  Administered 2020-09-06 – 2020-09-10 (×9): 750 mg via INTRAVENOUS
  Filled 2020-09-05 (×10): qty 150

## 2020-09-05 MED ORDER — CEFAZOLIN SODIUM-DEXTROSE 2-4 GM/100ML-% IV SOLN
2.0000 g | Freq: Three times a day (TID) | INTRAVENOUS | Status: DC
  Administered 2020-09-05: 2 g via INTRAVENOUS
  Filled 2020-09-05 (×2): qty 100

## 2020-09-05 MED ORDER — POTASSIUM CHLORIDE CRYS ER 20 MEQ PO TBCR: 20.0000 meq | EXTENDED_RELEASE_TABLET | Freq: Every day | ORAL | Status: DC | PRN

## 2020-09-05 MED ORDER — PANTOPRAZOLE SODIUM 40 MG PO TBEC
40.0000 mg | DELAYED_RELEASE_TABLET | Freq: Every day | ORAL | Status: DC
  Administered 2020-09-05 – 2020-09-11 (×7): 40 mg via ORAL
  Filled 2020-09-05 (×7): qty 1

## 2020-09-05 MED ORDER — ACETAMINOPHEN 325 MG PO TABS
325.0000 mg | ORAL_TABLET | ORAL | Status: DC | PRN
Start: 2020-09-05 — End: 2020-09-11
  Administered 2020-09-05 – 2020-09-07 (×6): 650 mg via ORAL
  Administered 2020-09-07: 325 mg via ORAL
  Administered 2020-09-07 – 2020-09-09 (×7): 650 mg via ORAL
  Filled 2020-09-05 (×14): qty 2

## 2020-09-05 MED ORDER — MAGNESIUM SULFATE 2 GM/50ML IV SOLN: 2.0000 g | Freq: Every day | INTRAVENOUS | Status: DC | PRN

## 2020-09-05 MED ORDER — PIPERACILLIN-TAZOBACTAM 3.375 G IVPB
3.3750 g | Freq: Three times a day (TID) | INTRAVENOUS | Status: DC
  Administered 2020-09-06: 3.375 g via INTRAVENOUS
  Filled 2020-09-05 (×3): qty 50

## 2020-09-05 MED ORDER — PIPERACILLIN-TAZOBACTAM 3.375 G IVPB 30 MIN
3.3750 g | Freq: Once | INTRAVENOUS | Status: AC
  Administered 2020-09-05: 3.375 g via INTRAVENOUS
  Filled 2020-09-05: qty 50

## 2020-09-05 MED ORDER — ALUM & MAG HYDROXIDE-SIMETH 200-200-20 MG/5ML PO SUSP
15.0000 mL | ORAL | Status: DC | PRN
Start: 2020-09-05 — End: 2020-09-11

## 2020-09-05 NOTE — ED Provider Notes (Signed)
[facility] EMERGENCY DEPARTMENT Provider Note   CSN: [id number] Arrival date & time: 09/05/20  1139     History Chief Complaint  Patient presents with   Abscess    Benjamin [name] is a 60 y.o. male.  HPI Patient presents with concern of an open wound on the left medial proximal calf.  Onset was about 1 day ago, and initially there was mild swelling, but over the past few hours the wound has opened and is draining purulent/bloody fluid.  He states that he otherwise feels well, was planning on working today.  He has a known history of peripheral vascular disease, has had both graft and bypass procedures. He notes he takes all medication as directed. No distal loss of sensation or weakness, no systemic complaints including fever, chills, nausea, vomiting, chest pain. Since onset no relief with anything and the wound began draining spontaneously.  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  He underwent stenting of a right common and external iliac stenosis.  Then on 11/13/2019 he had a right to left femoral-femoral bypass graft followed by left femoral to below-knee popliteal artery bypass graft with saphenous vein.  He was taken back to the operating room on 9/23 due to a bypass graft stenosis which was treated with balloon angioplasty.   The patient returned for surveillance ultrasound imaging in March of 2022,which showed that the femoral-femoral graft and the femoral-popliteal graft were occluded.  He was having symptoms of severe claudication/rest pain and he had a new wound on the lateral aspect of his foot.  The original ulcer on the top of his great toe has healed.  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   PAD (peripheral artery disease) (HCC) 11/13/2019   Callus of foot 07/01/2019   Intermittent claudication (HCC) 01/20/2017   Need for influenza vaccination 01/20/2017   Needs smoking cessation education 09/06/2016   Abnormal echocardiogram 09/05/2016   Abnormal EKG 07/25/2016   Preoperative cardiovascular examination 07/25/2016   Shortness of breath on exertion 07/25/2016   Dyslipidemia, goal LDL below 70 07/25/2016   Uncontrolled hypertension 07/25/2016   Abnormal chest x-ray 07/04/2016   Peripheral vascular disease (HCC) 07/04/2016   Decreased pedal pulses 06/24/2016   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 No sig Dz in infrarenal Abd Aorta. Normal Bilat Iliac arteries. R SFA is 100% @ origin - recon in AK-Pop A.  R PT A patent. L CFA occluded. L PFA recon @ origin, L SFA occluded w/ recon in AK Pop A.   ABDOMINAL AORTOGRAM W/LOWER EXTREMITY N/A 10/15/2019   Procedure: ABDOMINAL AORTOGRAM W/LOWER EXTREMITY;  Surgeon: Nada Libman, MD;  Location: [facility] INVASIVE CV LAB;  Service: Cardiovascular;  Laterality: N/A;   AORTA - BILATERAL FEMORAL ARTERY BYPASS GRAFT N/A  05/08/2020   Procedure: AORTA BIFEMORAL BYPASS GRAFT;  Surgeon: Nada Libman, MD;  Location: [facility] OR;  Service: Vascular;  Laterality: N/A;   COLONOSCOPY  06/2016   never   ENDARTERECTOMY FEMORAL Left 11/14/2019   Procedure: Left groin exploration, Redo left femoral artery exposure;  Surgeon: Nada Libman, MD;  Location: [facility] OR;  Service: Vascular;  Laterality: Left;   FEMORAL-FEMORAL BYPASS GRAFT N/A 11/13/2019   Procedure: BYPASS GRAFT FEMORAL-FEMORAL ARTERY RIGHT TO LEFT USING HEMASHIELD GOLD GRAFT 68mm x 30cm;  Surgeon: Nada Libman, MD;  Location: [facility] OR;  Service: Vascular;  Laterality: N/A;   FEMORAL-POPLITEAL BYPASS GRAFT Left 11/13/2019   Procedure: LEFT FEMORAL BELOW KNEE-POPLITEAL ARTERY USING NON-REVERSED GREATER SAPHENOUS VEIN;  Surgeon: Nada Libman, MD;  Location: [facility] OR;  Service: Vascular;  Laterality: Left;   FEMORAL-POPLITEAL BYPASS GRAFT Left 05/08/2020   Procedure: LEFT REDO BYPASS GRAFT COMMON FEMORAL- BELOW KNEE POPLITEAL ARTERY USING PROPATEN GRAFT;  Surgeon: Nada Libman, MD;  Location: [facility] OR;  Service: Vascular;  Laterality: Left;   LOWER EXTREMITY ANGIOGRAM Left 11/14/2019   Procedure: LEFT LOWER EXTREMITY ANGIOGRAM, BYPASS GRAFT ANGIOPLASTY;  Surgeon: Nada Libman, MD;  Location: [facility] OR;  Service: Vascular;  Laterality: Left;   NO PAST SURGERIES  06/2016   PERIPHERAL VASCULAR INTERVENTION  10/15/2019   Procedure: PERIPHERAL VASCULAR INTERVENTION;  Surgeon: Nada Libman, MD;  Location: [facility] INVASIVE CV LAB;  Service: Cardiovascular;;  Rt Iliac       Family History  Problem Relation Age of Onset   Arthritis Mother    COPD Father    Stroke Father    Multiple sclerosis Sister     Social History   Tobacco Use   Smoking status: Every Day    Packs/day: 1.50    Years: 29.00    Pack years: 43.50    Types: Cigarettes   Smokeless tobacco: Never   Tobacco comments:    he is down to 1 PPD - 07/22/2020  Vaping Use   Vaping Use: Never used  Substance  Use Topics   Alcohol use: No   Drug use: No    Home Medications Prior to Admission medications   Medication Sig Start Date End Date Taking? Authorizing Provider  acetaminophen (TYLENOL) 500 MG tablet Take 2 tablets (1,000 mg total) by mouth every 6 (six) hours as needed for mild pain or headache. 05/15/20   Emilie Rutter, PA-C  APPLE CIDER VINEGAR PO Take 1 tablet by mouth at bedtime.    [provider]  atorvastatin (LIPITOR) 40 MG tablet Take 1 tablet (40 mg total) by mouth daily. 07/06/20   Nada Libman, MD  buPROPion (WELLBUTRIN XL) 150 MG 24 hr tablet TAKE 1 TABLET (150 MG TOTAL) BY MOUTH EVERY MORNING. 07/14/20 07/14/21  Emilie Rutter, PA-C  clopidogrel (PLAVIX) 75 MG tablet Take 1 tablet (75 mg total) by mouth daily. 07/14/20   Emilie Rutter, PA-C  gabapentin (NEURONTIN) 100 MG capsule TAKE 1 CAPSULE (100 MG) IN THE MORNING AND 2 CAPSULE (200 MG) AT BEDTIME 07/14/20 07/14/21  Emilie Rutter, PA-C  Homeopathic Products (LEG CRAMPS  PM SL) Place 1 tablet under the tongue at bedtime.    [provider]  lactose free nutrition (BOOST) LIQD Take 237 mLs by mouth 2 (two) times daily between meals.    [provider]  Varenicline Tartrate (CHANTIX STARTING MONTH PAK PO) Take 0.5 mg by mouth daily.    [provider]  Vitamin D, Ergocalciferol, (DRISDOL) 1.25 MG (50000 UNIT) CAPS capsule Take 1 capsule (50,000 Units total) by mouth 2 (two) times a week. 08/17/20   Arnette FeltsMoore, Janece, FNP    Allergies    Aspirin and Garlic  Review of Systems   Review of Systems  Constitutional:        Per HPI, otherwise negative  HENT:         Per HPI, otherwise negative  Respiratory:         Per HPI, otherwise negative  Cardiovascular:        Per HPI, otherwise negative  Gastrointestinal:  Negative for vomiting.  Endocrine:       Negative aside from HPI  Genitourinary:        Neg aside from HPI   Musculoskeletal:        Per HPI, otherwise negative  Skin:   Positive for wound.  Neurological:  Negative for syncope.   Physical Exam Updated Vital Signs BP 132/88 (BP Location: Right Arm)   Pulse 92   Temp 98.3 F (36.8 C) (Oral)   Resp 18   Ht 5\' 7"  (1.702 m)   Wt 65.8 kg   SpO2 100%   BMI 22.71 kg/m   Physical Exam Vitals and nursing note reviewed.  Constitutional:      General: He is not in acute distress.    Appearance: He is well-developed.  HENT:     Head: Normocephalic and atraumatic.  Eyes:     Conjunctiva/sclera: Conjunctivae normal.  Cardiovascular:     Rate and Rhythm: Normal rate and regular rhythm.     Pulses: Normal pulses.  Pulmonary:     Effort: Pulmonary effort is normal. No respiratory distress.     Breath sounds: No stridor.  Abdominal:     General: There is no distension.  Musculoskeletal:       Legs:  Skin:    General: Skin is warm and dry.  Neurological:     Mental Status: He is alert and oriented to person, place, and time.    ED Results / Procedures / Treatments   Labs (all labs ordered are listed, but only abnormal results are displayed) Labs Reviewed  COMPREHENSIVE METABOLIC PANEL - Abnormal; Notable for the following components:      Result Value   Glucose, Bld 102 (*)    Calcium 8.8 (*)    Albumin 2.8 (*)    Total Bilirubin <0.1 (*)    All other components within normal limits  CBC WITH DIFFERENTIAL/PLATELET - Abnormal; Notable for the following components:   Hemoglobin 10.1 (*)    HCT 30.7 (*)    MCV 70.6 (*)    MCH 23.2 (*)    RDW 16.7 (*)    Platelets 446 (*)    All other components within normal limits  LIPID PANEL - Abnormal; Notable for the following components:   HDL 40 (*)    All other components within normal limits  I-STAT CHEM 8, ED - Abnormal; Notable for the following components:   Glucose, Bld 101 (*)    Hemoglobin 10.5 (*)    HCT 31.0 (*)  All other components within normal limits  RESP PANEL BY RT-PCR (FLU A&B, COVID) [id number]    EKG None  Radiology CT  ANGIO LOW EXTREM LEFT W &/OR WO CONTRAST  Result Date: 09/05/2020 CLINICAL DATA:  History of peripheral artery disease. Previous revascularization. Follow-up lower extremity arterial dissection. Suspected left lower extremity abscess associated with a vascular graft. EXAM: CT ANGIOGRAPHY LOWER LEFT EXTREMITY; CT ANGIOGRAPHY LOWER RIGHT EXTREMITY TECHNIQUE: CT imaging was acquired from the mid abdomen through the lower extremities after the the administration of 150 mL Omnipaque 350 intravenous contrast. Computer generated coronal and sagittal MPR and MIP were also acquired. CONTRAST:  OMNIPAQUE IOHEXOL 350 MG/ML SOLN COMPARISON:  None. FINDINGS: CTA FINDINGS Lower abdomen and pelvis: Patent aortobifemoral graft, native vessels thrombosed. No significant stenosis or aneurysm of the visualized lower abdominal aorta. No stenosis or aneurysm or dissection of the common or external iliac arteries. The internal iliac arteries are occluded. Right lower extremity: The native femoral artery is occluded. Deep femoral artery is patent with no significant stenosis. Small muscular branches are seen extending to the knee. The popliteal artery is reconstituted from muscular branches. Flow is seen, there is irregular atherosclerotic narrowing of the proximal posterior tibial artery, providing faint flow to the foot. There is irregular flow to small perineal and posterior tibial arteries, the latter extending to the level of the hindfoot. Left lower extremity: Femoral artery graft is occluded. The deep femoral artery is widely patent, without significant stenosis. This provide small muscular branches to the lower thigh extending to the knee. A portion of the popliteal artery is reconstituted by muscular branches. Is artery is again occluded at the level of the knee joint. A portion of the trifurcation vessels are reconstituted in the upper leg. In the mid leg the anterior tibial artery is reconstituted from collaterals,  providing faint flow to the foot. There is subtle flow in the peroneal and posterior tibial arteries to the level of the ankle. NON CTA FINDINGS There is a fluid collection adjacent to the thrombosed vascular graph of the left lower extremity. Fluid surrounds the thrombosed graft from the level of the distal femur to the proximal leg where it is contiguous with an irregular fluid collection that extends to the medial subcutaneous soft tissues of the leg. This collection measures approximately 19 cm from superior to inferior, most of which is directly adjacent to the thrombosed graft, by 5.8 x 4.7 cm transversely at the level of the proximal leg. There is subcutaneous soft tissue edema also noted throughout the left leg below the knee. No other soft tissue collections. No acute findings within the visualized abdomen or pelvis. There is a thrombosed femoral to femoral arterial graft. No fracture or bone lesion.  No evidence of osteomyelitis. Review of the MIP images confirms the above findings. IMPRESSION: 1. Completely thrombosed left femoral artery 2 popliteal artery graft. There is a fluid collection with peripheral enhancement that lies adjacent to the distal femoral artery graft, just above the knee, extending below the knee where it is contiguous with a fluid collection that lies along the posteromedial aspect of the upper leg. This is consistent with an abscess. 2. Significant peripheral vascular disease. The native femoral artery on the right is occluded. There is reconstitution of the popliteal artery on the right from deep femoral artery branches, providing some flow to the foot as detailed above, predominantly along the posterior tibial artery. There is also significant peripheral vascular disease on the left. The popliteal artery  is occluded. Trifurcation arteries are reconstituted, but small with irregular atherosclerotic narrowing. The predominant flow is from the anterior tibial artery, which extends  into the foot. Electronically Signed   By: Amie Portland M.D.   On: 09/05/2020 16:35   CT ANGIO LOW EXTREM RIGHT W &/OR WO CONTRAST  Result Date: 09/05/2020 CLINICAL DATA:  History of peripheral artery disease. Previous revascularization. Follow-up lower extremity arterial dissection. Suspected left lower extremity abscess associated with a vascular graft. EXAM: CT ANGIOGRAPHY LOWER LEFT EXTREMITY; CT ANGIOGRAPHY LOWER RIGHT EXTREMITY TECHNIQUE: CT imaging was acquired from the mid abdomen through the lower extremities after the the administration of 150 mL Omnipaque 350 intravenous contrast. Computer generated coronal and sagittal MPR and MIP were also acquired. CONTRAST:  OMNIPAQUE IOHEXOL 350 MG/ML SOLN COMPARISON:  None. FINDINGS: CTA FINDINGS Lower abdomen and pelvis: Patent aortobifemoral graft, native vessels thrombosed. No significant stenosis or aneurysm of the visualized lower abdominal aorta. No stenosis or aneurysm or dissection of the common or external iliac arteries. The internal iliac arteries are occluded. Right lower extremity: The native femoral artery is occluded. Deep femoral artery is patent with no significant stenosis. Small muscular branches are seen extending to the knee. The popliteal artery is reconstituted from muscular branches. Flow is seen, there is irregular atherosclerotic narrowing of the proximal posterior tibial artery, providing faint flow to the foot. There is irregular flow to small perineal and posterior tibial arteries, the latter extending to the level of the hindfoot. Left lower extremity: Femoral artery graft is occluded. The deep femoral artery is widely patent, without significant stenosis. This provide small muscular branches to the lower thigh extending to the knee. A portion of the popliteal artery is reconstituted by muscular branches. Is artery is again occluded at the level of the knee joint. A portion of the trifurcation vessels are reconstituted in the  upper leg. In the mid leg the anterior tibial artery is reconstituted from collaterals, providing faint flow to the foot. There is subtle flow in the peroneal and posterior tibial arteries to the level of the ankle. NON CTA FINDINGS There is a fluid collection adjacent to the thrombosed vascular graph of the left lower extremity. Fluid surrounds the thrombosed graft from the level of the distal femur to the proximal leg where it is contiguous with an irregular fluid collection that extends to the medial subcutaneous soft tissues of the leg. This collection measures approximately 19 cm from superior to inferior, most of which is directly adjacent to the thrombosed graft, by 5.8 x 4.7 cm transversely at the level of the proximal leg. There is subcutaneous soft tissue edema also noted throughout the left leg below the knee. No other soft tissue collections. No acute findings within the visualized abdomen or pelvis. There is a thrombosed femoral to femoral arterial graft. No fracture or bone lesion.  No evidence of osteomyelitis. Review of the MIP images confirms the above findings. IMPRESSION: 1. Completely thrombosed left femoral artery 2 popliteal artery graft. There is a fluid collection with peripheral enhancement that lies adjacent to the distal femoral artery graft, just above the knee, extending below the knee where it is contiguous with a fluid collection that lies along the posteromedial aspect of the upper leg. This is consistent with an abscess. 2. Significant peripheral vascular disease. The native femoral artery on the right is occluded. There is reconstitution of the popliteal artery on the right from deep femoral artery branches, providing some flow to the foot as detailed  above, predominantly along the posterior tibial artery. There is also significant peripheral vascular disease on the left. The popliteal artery is occluded. Trifurcation arteries are reconstituted, but small with irregular  atherosclerotic narrowing. The predominant flow is from the anterior tibial artery, which extends into the foot. Electronically Signed   By: Amie Portland M.D.   On: 09/05/2020 16:35    Procedures Procedures   Medications Ordered in ED Medications  lidocaine (PF) (XYLOCAINE) 1 % injection 5 mL (has no administration in time range)  iohexol (OMNIPAQUE) 350 MG/ML injection 150 mL (150 mLs Intravenous Contrast Given 09/05/20 1523)    ED Course  I have reviewed the triage vital signs and the nursing notes.  Pertinent labs & imaging results that were available during my care of the patient were reviewed by me and considered in my medical decision making (see chart for details).  Update: Patient remains in similar condition.  Update: I discussed the patient's case with our radiologist after personally reviewing the CT scan.  CT scan consistent with initial concern for communicating wound from superficial drainages site to area around vascular repair.  Conveyed this to the patient, and we discussed vascular surgeries pending evaluation.  Labs pending on signout.    MDM Rules/Calculators/A&P MDM Number of Diagnoses or Management Options Abscess of left leg: new, needed workup   Amount and/or Complexity of Data Reviewed Clinical lab tests: ordered and reviewed Tests in the radiology section of CPT: ordered and reviewed Tests in the medicine section of CPT: reviewed and ordered Discussion of test results with the performing providers: yes Decide to obtain previous medical records or to obtain history from someone other than the patient: yes Review and summarize past medical records: yes Discuss the patient with other providers: yes Independent visualization of images, tracings, or specimens: yes  Risk of Complications, Morbidity, and/or Mortality Presenting problems: high Diagnostic procedures: high Management options: high  Critical Care Total time providing critical care: < 30  minutes  Patient Progress Patient progress: stable   Final Clinical Impression(s) / ED Diagnoses Final diagnoses:  Abscess of left leg     Gerhard Munch, MD 09/06/20 0745

## 2020-09-05 NOTE — H&P (Signed)
H&P    Reason for Consult: Left leg wound infection Referring Physician: ED MRN #:  [id number]  History of Present Illness: This is a 60 y.o. male with history of hypertension hyperlipidemia and previous tobacco abuse that vascular surgery has been consulted with concern for wound infection in the left lower extremity.  Patient states he had a pustule along the medial proximal calf at an old incision in his left leg where it started draining combination of blood and possible pus.  Ultimately he was brought to the ED after his wife called 911.  CT imaging was obtained and vascular was consulted.  He has a complex history and has been under the care of Dr. Myra Gianotti.  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  He underwent stenting of a right common and external iliac stenosis.  Then on 11/13/2019 he had a right to left femoral-femoral bypass graft followed by left femoral to below-knee popliteal artery bypass graft with saphenous vein.  He was taken back to the operating room on 9/23 due to a bypass graft stenosis which was treated with balloon angioplasty.  The patient returned for surveillance ultrasound imaging in March of 2022,which showed that the femoral-femoral graft and the femoral-popliteal graft were occluded.  He was having symptoms of severe claudication/rest pain and he had a new wound on the lateral aspect of his foot.  The original ulcer on the top of his great toe has healed.  Therefore on 05/08/2020 he underwent an aortobifemoral bypass graft using a 16 x 8 bifurcated graft and a redo left femoral to below-knee popliteal bypass with PTFE by Dr. Myra Gianotti.  CTA of the left leg shows thrombosed left femoral to popliteal bypass graft.  There is an adjacent fluid collection around the distal bypass graft.  States his left leg has felt the same.  No wounds, no rest pain.  Past Medical History:  Diagnosis Date   Back pain    Heavy smoker    Hyperlipidemia  LDL goal <70    Hypertension    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   Bilateral femoral artery disease    Past Surgical History:  Procedure Laterality Date   ABDOMINAL AORTOGRAM W/LOWER EXTREMITY N/A 07/12/2016   Procedure: Abdominal Aortogram w/Lower Extremity;  Surgeon: Nada Libman, MD;  [facility]: [facility];  Service: Cardiovascular;  Laterality: N/A;  Bilateral extermity: Patent Renal As. No sig Dz in infrarenal Abd Aorta. Normal Bilat Iliac arteries. R SFA is 100% @ origin - recon in AK-Pop A. R PT A patent. L CFA occluded. L PFA recon @ origin, L SFA occluded w/ recon in AK Pop A.   ABDOMINAL AORTOGRAM W/LOWER EXTREMITY N/A 10/15/2019   Procedure: ABDOMINAL AORTOGRAM W/LOWER EXTREMITY;  Surgeon: Nada Libman, MD;  [facility]: [facility];  Service: Cardiovascular;  Laterality: N/A;   AORTA - BILATERAL FEMORAL ARTERY BYPASS GRAFT N/A 05/08/2020   Procedure: AORTA BIFEMORAL BYPASS GRAFT;  Surgeon: Nada Libman, MD;  Location: [facility];  Service: Vascular;  Laterality: N/A;   COLONOSCOPY  06/2016   never   ENDARTERECTOMY FEMORAL Left 11/14/2019   Procedure: Left groin exploration, Redo left femoral artery exposure;  Surgeon: Nada Libman, MD;  Location: [facility];  Service: Vascular;  Laterality: Left;   FEMORAL-FEMORAL BYPASS GRAFT N/A 11/13/2019   Procedure: BYPASS GRAFT FEMORAL-FEMORAL ARTERY RIGHT TO LEFT USING HEMASHIELD GOLD GRAFT 57mm x 30cm;  Surgeon: Nada Libman, MD;  Location: [facility];  Service: Vascular;  Laterality: N/A;   FEMORAL-POPLITEAL BYPASS GRAFT Left 11/13/2019   Procedure: LEFT FEMORAL BELOW KNEE-POPLITEAL ARTERY USING NON-REVERSED GREATER SAPHENOUS VEIN;  Surgeon: Nada LibmanBrabham, Vance W, MD;  Location: [facility];  Service: Vascular;  Laterality: Left;   FEMORAL-POPLITEAL BYPASS GRAFT Left 05/08/2020   Procedure: LEFT REDO BYPASS GRAFT COMMON FEMORAL- BELOW KNEE POPLITEAL ARTERY USING PROPATEN GRAFT;  Surgeon: Nada LibmanBrabham, Vance W, MD;  Location: [facility];  Service: Vascular;  Laterality: Left;   LOWER EXTREMITY ANGIOGRAM Left 11/14/2019   Procedure: LEFT LOWER EXTREMITY ANGIOGRAM, BYPASS GRAFT ANGIOPLASTY;  Surgeon: Nada LibmanBrabham, Vance W, MD;  Location: [facility];  Service: Vascular;  Laterality: Left;   NO PAST SURGERIES  06/2016   PERIPHERAL VASCULAR INTERVENTION  10/15/2019   Procedure: PERIPHERAL VASCULAR INTERVENTION;  Surgeon: Nada LibmanBrabham, Vance W, MD;  [facility];  Service: Cardiovascular;;  Rt Iliac    Allergies  Allergen Reactions   Aspirin Nausea And Vomiting   Garlic Other (See Comments)    sneezing    Prior to Admission medications   Medication Sig Start Date End Date Taking? Authorizing Provider  acetaminophen (TYLENOL) 500 MG tablet Take 2 tablets (1,000 mg total) by mouth every 6 (six) hours as needed for mild pain or headache. 05/15/20   Emilie RutterEveland, Matthew, PA-C  APPLE CIDER VINEGAR PO Take 1 tablet by mouth at bedtime.    [provider]  atorvastatin (LIPITOR) 40 MG tablet Take 1 tablet (40 mg total) by mouth daily. 07/06/20   Nada LibmanBrabham, Vance W, MD  buPROPion (WELLBUTRIN XL) 150 MG 24 hr tablet TAKE 1 TABLET (150 MG TOTAL) BY MOUTH EVERY MORNING. 07/14/20 07/14/21  Emilie RutterEveland, Matthew, PA-C  clopidogrel (PLAVIX) 75 MG tablet Take 1 tablet (75 mg total) by mouth daily. 07/14/20   Emilie RutterEveland, Matthew, PA-C  gabapentin (NEURONTIN) 100 MG capsule TAKE 1 CAPSULE (100 MG) IN THE MORNING AND 2 CAPSULE (200 MG) AT BEDTIME 07/14/20 07/14/21  Emilie RutterEveland, Matthew, PA-C  Homeopathic Products (LEG CRAMPS PM SL) Place 1 tablet under the tongue at bedtime.    [provider]  lactose free nutrition (BOOST) LIQD Take 237 mLs by mouth 2 (two) times daily between meals.    [provider]  Varenicline Tartrate (CHANTIX STARTING MONTH PAK PO) Take 0.5 mg by mouth daily.    [provider]  Vitamin D, Ergocalciferol, (DRISDOL) 1.25 MG (50000 UNIT) CAPS capsule Take 1 capsule (50,000 Units total) by mouth 2 (two) times a  week. 08/17/20   Arnette FeltsMoore, Janece, FNP    Social History   Socioeconomic History   Marital status: Single    Spouse name: Not on file   Number of children: Not on file   Years of education: 12   Highest education level: Not on file  Occupational History   Not on file  Tobacco Use   Smoking status: Every Day    Packs/day: 1.50    Years: 29.00    Pack years: 43.50    Types: Cigarettes   Smokeless tobacco: Never   Tobacco comments:    he is down to 1 PPD - 07/22/2020  Vaping Use   Vaping Use: Never used  Substance and Sexual Activity   Alcohol use: No   Drug use: No   Sexual activity: Not Currently  Other Topics Concern   Not on file  Social History Narrative   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   He lives with his sister and  her son.   Walks every day to work at least 15+ minutes.   Social Determinants of Health   Financial Resource Strain: High Risk   Difficulty of Paying Living Expenses: Hard  Food Insecurity: No Food Insecurity   Worried About Programme researcher, broadcasting/film/video in the Last Year: Never true   Ran Out of Food in the Last Year: Never true  Transportation Needs: No Transportation Needs   Lack of Transportation (Medical): No   Lack of Transportation (Non-Medical): No  Physical Activity: Not on file  Stress: Not on file  Social Connections: Not on file  Intimate Partner Violence: Not on file    Family History  Problem Relation Age of Onset   Arthritis Mother    COPD Father    Stroke Father    Multiple sclerosis Sister     ROS: [x]  Positive   [ ]  Negative   [ ]  All sytems reviewed and are negative  Cardiovascular: []  chest pain/pressure []  palpitations []  SOB lying flat []  DOE []  pain in legs while walking []  pain in legs at rest []  pain in legs at night []  non-healing ulcers []  hx of DVT []  swelling in legs  Pulmonary: []  productive cough []  asthma/wheezing []  home O2  Neurologic: []  weakness in []  arms []  legs []  numbness in []  arms  []  legs []  hx of CVA []  mini stroke [] difficulty speaking or slurred speech []  temporary loss of vision in one eye []  dizziness  Hematologic: []  hx of cancer []  bleeding problems []  problems with blood clotting easily  Endocrine:   []  diabetes []  thyroid disease  GI []  vomiting blood []  blood in stool  GU: []  CKD/renal failure []  HD--[]  M/W/F or []  T/T/S []  burning with urination []  blood in urine  Psychiatric: []  anxiety []  depression  Musculoskeletal: []  arthritis []  joint pain  Integumentary: []  rashes []  ulcers  Constitutional: []  fever []  chills   Physical Examination  Vitals:   09/05/20 1145  BP: 132/88  Pulse: 92  Resp: 18  Temp: 98.3 F (36.8 C)  SpO2: 100%   Body mass index is 22.71 kg/m.  General:  WDWN in NAD Gait: Not observed HENT: WNL, normocephalic Pulmonary: normal non-labored breathing, without Rales, rhonchi,  wheezing Cardiac: regular, without  Murmurs, rubs or gallops Abdomen: soft, NT/ND Vascular Exam/Pulses: Palpable femoral pulses bilaterally Right PT monophasic Unable to get any signals in the left foot Serosanguineous with some discolored fluid draining from the left below-knee popliteal incision Extremities: without ischemic changes, without Gangrene  Musculoskeletal: no muscle wasting or atrophy  Neurologic: A&O X 3; Appropriate Affect ; SENSATION: normal; MOTOR FUNCTION:  moving all extremities equally. Speech is fluent/normal   CBC    Component Value Date/Time   WBC 7.3 07/22/2020 1150   WBC 10.7 (H) 05/11/2020 0754   RBC 4.94 07/22/2020 1150   RBC 4.07 (L) 05/11/2020 0754   HGB 10.5 (L) 09/05/2020 1259   HGB 12.2 (L) 07/22/2020 1150   HCT 31.0 (L) 09/05/2020 1259   HCT 37.4 (L) 07/22/2020 1150   PLT 419 07/22/2020 1150   MCV 76 (L) 07/22/2020 1150   MCH 24.7 (L) 07/22/2020 1150   MCH 26.8 05/11/2020 0754   MCHC 32.6 07/22/2020 1150   MCHC 35.6 05/11/2020 0754   RDW 15.8 (H) 07/22/2020 1150   LYMPHSABS  1,360 06/24/2016 1024   MONOABS 480 06/24/2016 1024   EOSABS 80 06/24/2016 1024   BASOSABS 0 06/24/2016 1024    BMET  Component Value Date/Time   NA 137 09/05/2020 1259   NA 140 07/22/2020 1150   K 4.1 09/05/2020 1259   CL 101 09/05/2020 1259   CO2 20 07/22/2020 1150   GLUCOSE 101 (H) 09/05/2020 1259   BUN 14 09/05/2020 1259   BUN 14 07/22/2020 1150   CREATININE 0.90 09/05/2020 1259   CREATININE 1.02 01/20/2017 0908   CALCIUM 9.3 07/22/2020 1150   GFRNONAA >60 05/11/2020 0754   GFRAA >60 11/14/2019 0508    COAGS: Lab Results  Component Value Date   INR 1.2 05/08/2020   INR 1.1 05/08/2020   INR 1.1 11/13/2019     Non-Invasive Vascular Imaging:    CTA reviewed and aortobifem is patent but the left femoropopliteal is thrombosed.  There is fluid in the below-knee popliteal space.   ASSESSMENT/PLAN: This is a 61 y.o. male with complex history including previous femorofemoral bypass with a left femoropopliteal that thrombosed ultimately requiring aortobifem with a left femoropopliteal with PTFE by Dr. Myra Gianotti earlier this year.  Now has fluid draining from his left below-knee popliteal incision concerning for possible infection.  Certainly raises the question of graft infection.  Will admit for broad-spectrum antibiotics and place on Vanc Zosyn.  Take to the OR this week for washout and VAC placement.  Appears to have a occlusion of his graft (re-do left fem pop) but this is a silent occlusion given he has no symptoms in the leg and its unclear when this would have occluded.  Discussed if his graft is infected this can put him at high risk for limb loss.  Cephus Shelling, MD [facility] Office: [phone]  Cephus Shelling

## 2020-09-05 NOTE — ED Notes (Signed)
Went to lobby to see if pt was sitting outside. Could not find pt, made charge RN aware. Pt's cell phone called and went straight to voicemail. Called pt's home number and sister stated she will try to contact him by text message.

## 2020-09-05 NOTE — ED Notes (Signed)
Patient transported to CT 

## 2020-09-05 NOTE — ED Notes (Signed)
Pt has an 8cmx6cm abscess on left lower leg that is draining a moderate amount of serosangous fluid.

## 2020-09-05 NOTE — ED Notes (Signed)
Pt wheeled back to the stretcher by a hospital employee. Pt states he got lost while walking. Pt informed he will no longer be able to walk around.

## 2020-09-05 NOTE — ED Triage Notes (Signed)
Pt coming from home, complaint of blister that popped this morning and is now draining fluid.

## 2020-09-05 NOTE — Progress Notes (Signed)
Pharmacy Antibiotic Note  Benjamin [name] is a 60 y.o. male admitted on 09/05/2020 with  wound infection .  Pharmacy has been consulted for vancomycin and zosyn dosing.  Patient complains of open wound on the left medial proximal calf with an onset of ~ 1 day ago. 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  CTA showing thrombosed left femoral to popliteal bypass graft and adjacent fluid collection around the distal bypass graft.  Plan: Zosyn 3.375 mg q8h Vancomycin 1500mg  once then 750 mg q12h for eAUC of 422.7 Pharmacy to adjust dosing per renal function Follow-up cultures Follow vascular recommendations  Height: 5\' 7"  (170.2 cm) Weight: 65.8 kg (145 lb) IBW/kg (Calculated) : 66.1  Temp (24hrs), Avg:98.3 F (36.8 C), Min:98.3 F (36.8 C), Max:98.3 F (36.8 C)  Recent Labs  Lab 09/05/20 1259 09/05/20 1641  WBC  --  9.4  CREATININE 0.90 0.85    Estimated Creatinine Clearance: 84.9 mL/min (by C-G formula based on SCr of 0.85 mg/dL).    Allergies  Allergen Reactions   Aspirin Nausea And Vomiting   Garlic Other (See Comments)    sneezing   Antimicrobials this admission: vancomycin 7/16 >>  zosyn 7/16 >>   Microbiology results: Pending  Thank you for allowing pharmacy to be a part of this patient's care.  8/16 09/05/2020 9:23 PM

## 2020-09-05 NOTE — ED Notes (Signed)
Cleaned pt's abscess w/NS and applied a dry dressing.

## 2020-09-05 NOTE — ED Notes (Signed)
Pt stated the doctor said he can get up and walk around when his leg starts to hurt.

## 2020-09-05 NOTE — ED Notes (Signed)
Sister [name] [phone] would like an update

## 2020-09-06 LAB — LIPID PANEL
Cholesterol: 154 mg/dL (ref 0–200)
HDL: 40 mg/dL — ABNORMAL LOW (ref >40)
LDL Cholesterol: 96 mg/dL (ref 0–99)
Total CHOL/HDL Ratio: 3.9 RATIO
Triglycerides: 91 mg/dL (ref <150)
VLDL: 18 mg/dL (ref 0–40)

## 2020-09-06 MED ORDER — SODIUM CHLORIDE 0.9 % IV SOLN
2.0000 g | Freq: Three times a day (TID) | INTRAVENOUS | Status: DC
  Administered 2020-09-06 – 2020-09-10 (×12): 2 g via INTRAVENOUS
  Filled 2020-09-06 (×15): qty 2

## 2020-09-06 MED ORDER — METRONIDAZOLE 500 MG/100ML IV SOLN
500.0000 mg | Freq: Three times a day (TID) | INTRAVENOUS | Status: DC
  Administered 2020-09-06 – 2020-09-10 (×12): 500 mg via INTRAVENOUS
  Filled 2020-09-06 (×12): qty 100

## 2020-09-06 MED ORDER — DIPHENHYDRAMINE HCL 25 MG PO CAPS
25.0000 mg | ORAL_CAPSULE | Freq: Once | ORAL | Status: AC
  Administered 2020-09-06: 25 mg via ORAL
  Filled 2020-09-06: qty 1

## 2020-09-06 NOTE — Progress Notes (Signed)
[facility]  Subjective  -no complaints   Objective 124/81 67 98 F (36.7 C) (Oral) 18 94%  Intake/Output Summary (Last 24 hours) at 09/06/2020 0852 Last data filed at 09/06/2020 0153 Gross per 24 hour  Intake 41.14 ml  Output 200 ml  Net -158.86 ml    Left below-knee popliteal incision on the medial calf with serosanguineous drainage  Laboratory Lab Results: Recent Labs    09/05/20 1259 09/05/20 1641  WBC  --  9.4  HGB 10.5* 10.1*  HCT 31.0* 30.7*  PLT  --  446*   BMET Recent Labs    09/05/20 1259 09/05/20 1641  NA 137 136  K 4.1 4.1  CL 101 101  CO2  --  28  GLUCOSE 101* 102*  BUN 14 13  CREATININE 0.90 0.85  CALCIUM  --  8.8*    COAG Lab Results  Component Value Date   INR 1.2 05/08/2020   INR 1.1 05/08/2020   INR 1.1 11/13/2019   No results found for: PTT  Assessment/Planning:  60 year old male status post aortobifem and redo left femoropopliteal bypass with graft by Dr. Myra Gianotti on 05/08/20.  Presented to the ED last night with drainage from his left below-knee popliteal incision.  Appears to have a silent occlusion of the graft given he has no symptoms in the left leg but the graft is obviously occluded on CTA and I am unclear on the timeline.  He is on Vanc Zosyn.  Plan OR tomorrow for washout and VAC placement and cultures.  Certainly discussed my concern for possible graft infection.  Cephus Shelling 09/06/2020 8:52 AM --

## 2020-09-06 NOTE — Progress Notes (Signed)
Pt c/o feeling anxious about procedure tomorrow and would like medication to help him sleep. Provider paged. Awaiting new orders.

## 2020-09-07 ENCOUNTER — Encounter (HOSPITAL_COMMUNITY)
Admission: EM | Disposition: A | Discharge status: Home Health | Payer: Self-pay | Source: Home / Self Care | Attending: Vascular Surgery

## 2020-09-07 ENCOUNTER — Inpatient Hospital Stay (HOSPITAL_COMMUNITY): Payer: Medicaid Other | Admitting: Anesthesiology

## 2020-09-07 ENCOUNTER — Encounter (HOSPITAL_COMMUNITY): Payer: Self-pay | Admitting: Vascular Surgery

## 2020-09-07 DIAGNOSIS — T82868A Thrombosis of vascular prosthetic devices, implants and grafts, initial encounter: Secondary | ICD-10-CM

## 2020-09-07 DIAGNOSIS — Y832 Surgical operation with anastomosis, bypass or graft as the cause of abnormal reaction of the patient, or of later complication, without mention of misadventure at the time of the procedure: Secondary | ICD-10-CM

## 2020-09-07 DIAGNOSIS — T827XXA Infection and inflammatory reaction due to other cardiac and vascular devices, implants and grafts, initial encounter: Secondary | ICD-10-CM

## 2020-09-07 HISTORY — PX: I & D EXTREMITY: SHX5045

## 2020-09-07 LAB — CBC
HCT: 29 % — ABNORMAL LOW (ref 39.0–52.0)
Hemoglobin: 9.9 g/dL — ABNORMAL LOW (ref 13.0–17.0)
MCH: 23.7 pg — ABNORMAL LOW (ref 26.0–34.0)
MCHC: 34.1 g/dL (ref 30.0–36.0)
MCV: 69.5 fL — ABNORMAL LOW (ref 80.0–100.0)
Platelets: 437 10*3/uL — ABNORMAL HIGH (ref 150–400)
RBC: 4.17 MIL/uL — ABNORMAL LOW (ref 4.22–5.81)
RDW: 16.5 % — ABNORMAL HIGH (ref 11.5–15.5)
WBC: 6.5 10*3/uL (ref 4.0–10.5)
nRBC: 0 % (ref 0.0–0.2)

## 2020-09-07 LAB — BASIC METABOLIC PANEL
Anion gap: 7 (ref 5–15)
BUN: 8 mg/dL (ref 8–23)
CO2: 24 mmol/L (ref 22–32)
Calcium: 8.7 mg/dL — ABNORMAL LOW (ref 8.9–10.3)
Chloride: 106 mmol/L (ref 98–111)
Creatinine, Ser: 0.89 mg/dL (ref 0.61–1.24)
GFR, Estimated: >60 mL/min (ref >60)
Glucose, Bld: 89 mg/dL (ref 70–99)
Potassium: 3.8 mmol/L (ref 3.5–5.1)
Sodium: 137 mmol/L (ref 135–145)

## 2020-09-07 LAB — MRSA NEXT GEN BY PCR, NASAL: MRSA by PCR Next Gen: NOT DETECTED

## 2020-09-07 SURGERY — IRRIGATION AND DEBRIDEMENT EXTREMITY
Anesthesia: General | Site: Leg Lower | Laterality: Left

## 2020-09-07 MED ORDER — LACTATED RINGERS IV SOLN: INTRAVENOUS | Status: DC | PRN

## 2020-09-07 MED ORDER — LIDOCAINE 2% (20 MG/ML) 5 ML SYRINGE
INTRAMUSCULAR | Status: DC | PRN
  Administered 2020-09-07: 60 mg via INTRAVENOUS

## 2020-09-07 MED ORDER — MIDAZOLAM HCL 2 MG/2ML IJ SOLN
INTRAMUSCULAR | Status: DC | PRN
  Administered 2020-09-07: 2 mg via INTRAVENOUS

## 2020-09-07 MED ORDER — OXYCODONE HCL 5 MG PO TABS
5.0000 mg | ORAL_TABLET | Freq: Once | ORAL | Status: AC | PRN
  Administered 2020-09-07: 5 mg via ORAL

## 2020-09-07 MED ORDER — OXYCODONE HCL 5 MG PO TABS
ORAL_TABLET | ORAL | Status: AC
  Filled 2020-09-07: qty 1

## 2020-09-07 MED ORDER — PHENYLEPHRINE 40 MCG/ML (10ML) SYRINGE FOR IV PUSH (FOR BLOOD PRESSURE SUPPORT)
PREFILLED_SYRINGE | INTRAVENOUS | Status: DC | PRN
Start: 2020-09-07 — End: 2020-09-07
  Administered 2020-09-07: 80 ug via INTRAVENOUS

## 2020-09-07 MED ORDER — 0.9 % SODIUM CHLORIDE (POUR BTL) OPTIME
TOPICAL | Status: DC | PRN
  Administered 2020-09-07: 2000 mL

## 2020-09-07 MED ORDER — DEXAMETHASONE SODIUM PHOSPHATE 10 MG/ML IJ SOLN
INTRAMUSCULAR | Status: DC | PRN
  Administered 2020-09-07: 5 mg via INTRAVENOUS

## 2020-09-07 MED ORDER — FENTANYL CITRATE (PF) 250 MCG/5ML IJ SOLN
INTRAMUSCULAR | Status: DC | PRN
  Administered 2020-09-07 (×2): 25 ug via INTRAVENOUS
  Administered 2020-09-07: 50 ug via INTRAVENOUS
  Administered 2020-09-07: 25 ug via INTRAVENOUS

## 2020-09-07 MED ORDER — ORAL CARE MOUTH RINSE: 15.0000 mL | Freq: Once | OROMUCOSAL | Status: AC

## 2020-09-07 MED ORDER — ACETAMINOPHEN 500 MG PO TABS
ORAL_TABLET | ORAL | Status: AC
  Administered 2020-09-07: 1000 mg via ORAL
  Filled 2020-09-07: qty 2

## 2020-09-07 MED ORDER — FENTANYL CITRATE (PF) 100 MCG/2ML IJ SOLN: 25.0000 ug | INTRAMUSCULAR | Status: DC | PRN

## 2020-09-07 MED ORDER — FENTANYL CITRATE (PF) 250 MCG/5ML IJ SOLN
INTRAMUSCULAR | Status: AC
  Filled 2020-09-07: qty 5

## 2020-09-07 MED ORDER — MIDAZOLAM HCL 2 MG/2ML IJ SOLN
INTRAMUSCULAR | Status: AC
  Filled 2020-09-07: qty 2

## 2020-09-07 MED ORDER — OXYCODONE HCL 5 MG/5ML PO SOLN: 5.0000 mg | Freq: Once | ORAL | Status: AC | PRN

## 2020-09-07 MED ORDER — CHLORHEXIDINE GLUCONATE 0.12 % MT SOLN
15.0000 mL | Freq: Once | OROMUCOSAL | Status: AC
  Administered 2020-09-07: 15 mL via OROMUCOSAL
  Filled 2020-09-07: qty 15

## 2020-09-07 MED ORDER — PROPOFOL 10 MG/ML IV BOLUS
INTRAVENOUS | Status: DC | PRN
  Administered 2020-09-07: 150 mg via INTRAVENOUS

## 2020-09-07 MED ORDER — ACETAMINOPHEN 500 MG PO TABS
1000.0000 mg | ORAL_TABLET | Freq: Once | ORAL | Status: AC
  Filled 2020-09-07: qty 2

## 2020-09-07 MED ORDER — ONDANSETRON HCL 4 MG/2ML IJ SOLN
INTRAMUSCULAR | Status: DC | PRN
  Administered 2020-09-07: 4 mg via INTRAVENOUS

## 2020-09-07 MED ORDER — MORPHINE SULFATE (PF) 2 MG/ML IV SOLN
2.0000 mg | INTRAVENOUS | Status: DC | PRN
  Administered 2020-09-11: 2 mg via INTRAVENOUS
  Filled 2020-09-07: qty 1

## 2020-09-07 MED ORDER — ONDANSETRON HCL 4 MG/2ML IJ SOLN
4.0000 mg | Freq: Four times a day (QID) | INTRAMUSCULAR | Status: DC | PRN
Start: 2020-09-07 — End: 2020-09-07

## 2020-09-07 MED ORDER — OXYCODONE-ACETAMINOPHEN 7.5-325 MG PO TABS
1.0000 | ORAL_TABLET | ORAL | Status: DC | PRN
  Administered 2020-09-07 – 2020-09-11 (×9): 1 via ORAL
  Filled 2020-09-07 (×9): qty 1

## 2020-09-07 MED ORDER — LACTATED RINGERS IV SOLN: INTRAVENOUS | Status: DC

## 2020-09-07 MED ORDER — PHENYLEPHRINE HCL-NACL 10-0.9 MG/250ML-% IV SOLN
INTRAVENOUS | Status: DC | PRN
  Administered 2020-09-07: 20 ug/min via INTRAVENOUS

## 2020-09-07 SURGICAL SUPPLY — 51 items
BAG COUNTER SPONGE SURGICOUNT (BAG) ×9 IMPLANT
BNDG ELASTIC 4X5.8 VLCR STR LF (GAUZE/BANDAGES/DRESSINGS) IMPLANT
BNDG ELASTIC 6X10 VLCR STRL LF (GAUZE/BANDAGES/DRESSINGS) ×3 IMPLANT
BNDG ELASTIC 6X5.8 VLCR STR LF (GAUZE/BANDAGES/DRESSINGS) IMPLANT
BNDG GAUZE ELAST 4 BULKY (GAUZE/BANDAGES/DRESSINGS) ×6 IMPLANT
CANISTER SUCT 3000ML PPV (MISCELLANEOUS) IMPLANT
CNTNR URN SCR LID CUP LEK RST (MISCELLANEOUS) ×2 IMPLANT
CONT SPEC 4OZ STRL OR WHT (MISCELLANEOUS) ×3
COVER SURGICAL LIGHT HANDLE (MISCELLANEOUS) ×3 IMPLANT
DERMABOND ADVANCED (GAUZE/BANDAGES/DRESSINGS) ×1
DERMABOND ADVANCED .7 DNX12 (GAUZE/BANDAGES/DRESSINGS) ×2 IMPLANT
DRAPE INCISE IOBAN 66X45 STRL (DRAPES) IMPLANT
DRAPE ORTHO SPLIT 77X108 STRL (DRAPES) ×3
DRAPE SURG ORHT 6 SPLT 77X108 (DRAPES) ×2 IMPLANT
DRSG VAC ATS LRG SENSATRAC (GAUZE/BANDAGES/DRESSINGS) IMPLANT
DRSG VAC ATS MED SENSATRAC (GAUZE/BANDAGES/DRESSINGS) IMPLANT
ELECT REM PT RETURN 9FT ADLT (ELECTROSURGICAL) ×3
ELECTRODE REM PT RTRN 9FT ADLT (ELECTROSURGICAL) ×2 IMPLANT
GAUZE 4X4 16PLY ~~LOC~~+RFID DBL (SPONGE) ×3 IMPLANT
GAUZE SPONGE 4X4 12PLY STRL (GAUZE/BANDAGES/DRESSINGS) ×3 IMPLANT
GLOVE SRG 8 PF TXTR STRL LF DI (GLOVE) ×2 IMPLANT
GLOVE SURG ENC MOIS LTX SZ7.5 (GLOVE) ×3 IMPLANT
GLOVE SURG UNDER POLY LF SZ8 (GLOVE) ×3
GOWN STRL REUS W/ TWL LRG LVL3 (GOWN DISPOSABLE) ×4 IMPLANT
GOWN STRL REUS W/ TWL XL LVL3 (GOWN DISPOSABLE) ×2 IMPLANT
GOWN STRL REUS W/TWL LRG LVL3 (GOWN DISPOSABLE) ×6
GOWN STRL REUS W/TWL XL LVL3 (GOWN DISPOSABLE) ×3
KIT BASIN OR (CUSTOM PROCEDURE TRAY) ×3 IMPLANT
KIT TURNOVER KIT B (KITS) ×3 IMPLANT
NS IRRIG 1000ML POUR BTL (IV SOLUTION) ×3 IMPLANT
PACK CV ACCESS (CUSTOM PROCEDURE TRAY) IMPLANT
PACK GENERAL/GYN (CUSTOM PROCEDURE TRAY) ×3 IMPLANT
PACK PERIPHERAL VASCULAR (CUSTOM PROCEDURE TRAY) ×3 IMPLANT
PAD ABD 8X10 STRL (GAUZE/BANDAGES/DRESSINGS) ×3 IMPLANT
PAD ARMBOARD 7.5X6 YLW CONV (MISCELLANEOUS) ×6 IMPLANT
STAPLER VISISTAT 35W (STAPLE) IMPLANT
SUT ETHILON 2 0 PSLX (SUTURE) IMPLANT
SUT ETHILON 3 0 PS 1 (SUTURE) IMPLANT
SUT MNCRL AB 4-0 PS2 18 (SUTURE) IMPLANT
SUT PROLENE 4 0 RB 1 (SUTURE) ×3
SUT PROLENE 4 0 SH DA (SUTURE) ×3 IMPLANT
SUT PROLENE 4-0 RB1 .5 CRCL 36 (SUTURE) ×2 IMPLANT
SUT PROLENE 5 0 C 1 24 (SUTURE) ×30 IMPLANT
SUT PROLENE 6 0 BV (SUTURE) ×3 IMPLANT
SUT VIC AB 2-0 CT1 27 (SUTURE)
SUT VIC AB 2-0 CT1 TAPERPNT 27 (SUTURE) IMPLANT
SUT VIC AB 3-0 SH 27 (SUTURE)
SUT VIC AB 3-0 SH 27X BRD (SUTURE) IMPLANT
TAPE CLOTH SURG 4X10 WHT LF (GAUZE/BANDAGES/DRESSINGS) ×3 IMPLANT
TOWEL GREEN STERILE (TOWEL DISPOSABLE) ×3 IMPLANT
WATER STERILE IRR 1000ML POUR (IV SOLUTION) ×3 IMPLANT

## 2020-09-07 NOTE — Op Note (Addendum)
Date: September 07, 2020  Preoperative diagnosis: Left proximal calf wound infection with concern for underlying graft infection  Postoperative diagnosis: Same  Procedure: Incision and drainage of left proximal calf wound with excision of distal femoropopliteal prosthetic bypass graft  Surgeon: Dr. Cephus Shelling, MD  Assistant: Wendi Maya, PA  Indications: Patient is a 60 year old male who has a complex vascular history.  He initially underwent right to left femoral-femoral bypass with a left  femoral to below-knee popliteal bypass with vein in 2021 by Dr. Myra Gianotti.  This ultimately thrombosed and he was taken back in March 2022 for aortobifemoral bypass and a redo left femoropopliteal bypass with prosthetic also by Dr. Myra Gianotti.  He presented to the ED on Saturday night with reported purulent drainage from his left proximal calf incision on the medial side where the distal target was exposed.  CT showed fluid around the distal graft and the graft was thrombosed.  He presents for I&D and possible excision of the graft after risk benefits discussed.  Findings: The prosthetic femoropopliteal bypass was completely dehisced from the distal anastomosis on the native below-knee popliteal artery.  I was able to excise about 10 cm of the bypass from the below-knee popliteal exposure.  Ultimately wound cultures as well as graft cultures were sent.  There was no antegrade or retrograde bleeding from the native popliteal artery where the arteriotomy had been performed and graft dehisced and so I elected to oversew each end of the popliteal artery here with Prolene suture.    Anesthesia: General  Details: Patient was taken to the operating room after informed consent was obtained.  Placed on the operative table supine position.  General endotracheal anesthesia was induced.  Timeout was performed.  Antibiotics were given.  I initially probed my finger into the draining wound in the left medial proximal calf.   I then opened this with Bovie cautery.  I initially sent cultures of the fluid that looks serosanguineous.  I then extended the incision ultimately reflecting the gastrocnemius muscle posteriorly and when I entered the popliteal space the graft was evident surrounded by fluid and was not adherent to the soft tissue.  Upon further inspection this was completely dehisced from the distal anastomosis.  I then cut out the Prolene sutures and pulled as much graft from the below-knee popliteal incision as possible and then cut this with straight scissors up behind the knee joint and removed about 10 cm of distal graft.  The graft was thrombosed with no flow.  My initial plan was likely a vein patch of the distal popliteal artery where the anastomosis was performed.  But on closer inspection there was no antegrade or retrograde bleeding.  Rather than performing patch, given no flow here, I elected to just oversew each end with multiple Prolene sutures.  The artery was very friable.  I did have a venous injury trying to extend the exposure distally and had to repair the popliteal vein with multiple Prolene sutures.  This was copiously irrigated out.  The wound was pretty oozy so elected to pack this with Betadine soaked Kerlix wrapped with Ace.   Complication: None  Condition: Stable  Cephus Shelling, MD [facility] Office: [phone]   Cephus Shelling

## 2020-09-07 NOTE — Anesthesia Procedure Notes (Signed)
Procedure Name: LMA Insertion Date/Time: 09/07/2020 3:20 PM Performed by: Carlos American, CRNA Pre-anesthesia Checklist: Patient identified, Emergency Drugs available, Suction available and Patient being monitored Patient Re-evaluated:Patient Re-evaluated prior to induction Oxygen Delivery Method: Circle System Utilized Preoxygenation: Pre-oxygenation with 100% oxygen Induction Type: IV induction Ventilation: Mask ventilation without difficulty LMA: LMA inserted LMA Size: 4.0 Number of attempts: 1 Placement Confirmation: positive ETCO2 Tube secured with: Tape Dental Injury: Teeth and Oropharynx as per pre-operative assessment

## 2020-09-07 NOTE — Progress Notes (Signed)
[facility]  Subjective  -no complaints   Objective 140/88 (!) 57 98 F (36.7 C) (Oral) 20 100%  Intake/Output Summary (Last 24 hours) at 09/07/2020 1427 Last data filed at 09/07/2020 0606 Gross per 24 hour  Intake 726.42 ml  Output --  Net 726.42 ml    Left below-knee popliteal incision on the medial calf with serosanguineous drainage  Laboratory Lab Results: Recent Labs    09/05/20 1641 09/07/20 0454  WBC 9.4 6.5  HGB 10.1* 9.9*  HCT 30.7* 29.0*  PLT 446* 437*   BMET Recent Labs    09/05/20 1641 09/07/20 0454  NA 136 137  K 4.1 3.8  CL 101 106  CO2 28 24  GLUCOSE 102* 89  BUN 13 8  CREATININE 0.85 0.89  CALCIUM 8.8* 8.7*    COAG Lab Results  Component Value Date   INR 1.2 05/08/2020   INR 1.1 05/08/2020   INR 1.1 11/13/2019   No results found for: PTT  Assessment/Planning:  60 year old male status post aortobifem and redo left femoropopliteal bypass with graft by Dr. Myra Gianotti on 05/08/20.  Presented to the ED saturday night with drainage from his left below-knee popliteal incision.  Appears to have a silent occlusion of the graft given he has no symptoms in the left leg but the graft is obviously occluded on CTA and I am unclear on the timeline.  He is on Vanc Zosyn.    We will plan to I&D the left below-knee incision with VAC placement.  Discussed pending if graft is exposed may ultimately try to excise a portion of graft and potential vein patch.  Will be high risk for more proximal extension of infection.  Cephus Shelling 09/07/2020 2:27 PM --

## 2020-09-07 NOTE — Transfer of Care (Signed)
Immediate Anesthesia Transfer of Care Note  Patient: Benjamin [name]  Procedure(s) Performed: IRRIGATION AND DEBRIDEMENT LEFT LEG WITH EXCISION OF LEFT LEG DISTAL BYPASS GRAFT (Left: Leg Lower)  Patient Location: PACU  Anesthesia Type:General  Level of Consciousness: awake, alert  and patient cooperative  Airway & Oxygen Therapy: Patient Spontanous Breathing and Patient connected to face mask oxygen  Post-op Assessment: Report given to RN and Post -op Vital signs reviewed and stable  Post vital signs: Reviewed and stable  Last Vitals:  Vitals Value Taken Time  BP 143/90 09/07/20 1654  Temp    Pulse 87 09/07/20 1657  Resp 20 09/07/20 1657  SpO2 95 % 09/07/20 1657  Vitals shown include unvalidated device data.  Last Pain:  Vitals:   09/07/20 1442  TempSrc:   PainSc: 5       Patients Stated Pain Goal: 3 (09/07/20 1442)  Complications: No notable events documented.

## 2020-09-07 NOTE — Social Work (Signed)
Outpatient Heart and Vascular LCSW received a call from pt sister [name] [phone]), she shared pt in the hospital. She shares she is worried pt was doing more activity than was advised and hasn't been taking his medications. She believes he will have continued medical work up.   Pt lives with his sister and relatives. He was previously employed at [employer]. He has a disability case pending w/ assistance of lawyer. Heart/Vascular care navigation team had financially assisted with medications and since first working with pt he had been approved for Medicaid. B/c of this approval pt was no longer eligible for additional medication assistance programs we had been working on. He and his sister had been encouraged to fill his medications at [facility] pharmacy and had shared they would be able to afford copays; unclear if medications had been filled again after our last conversation.   I encouraged pt sister to reach out to me if any questions/concerns. Recommended she have all medications transferred to [facility] pharmacy. I also let RNCM Heather, RN, know that I am available as needed for any discharge/outpatient care navigation needs.   Benjamin [name], MSW, LCSW [facility] Heart/Vascular Care Navigation  [phone]

## 2020-09-07 NOTE — Anesthesia Preprocedure Evaluation (Signed)
Anesthesia Evaluation  Patient identified by MRN, date of birth, ID band Patient awake    Reviewed: Allergy & Precautions, H&P , NPO status , Patient's Chart, lab work & pertinent test results  Airway Mallampati: II   Neck ROM: full    Dental   Pulmonary Current Smoker,    breath sounds clear to auscultation       Cardiovascular hypertension, + Peripheral Vascular Disease   Rhythm:regular Rate:Normal     Neuro/Psych    GI/Hepatic   Endo/Other    Renal/GU      Musculoskeletal   Abdominal   Peds  Hematology  (+) anemia ,   Anesthesia Other Findings   Reproductive/Obstetrics                             Anesthesia Physical Anesthesia Plan  ASA: 3  Anesthesia Plan: General   Post-op Pain Management:    Induction: Intravenous  PONV Risk Score and Plan: 1 and Ondansetron, Dexamethasone and Treatment may vary due to age or medical condition  Airway Management Planned: LMA  Additional Equipment:   Intra-op Plan:   Post-operative Plan: Extubation in OR  Informed Consent: I have reviewed the patients History and Physical, chart, labs and discussed the procedure including the risks, benefits and alternatives for the proposed anesthesia with the patient or authorized representative who has indicated his/her understanding and acceptance.     Dental advisory given  Plan Discussed with: CRNA, Anesthesiologist and Surgeon  Anesthesia Plan Comments:         Anesthesia Quick Evaluation

## 2020-09-08 ENCOUNTER — Encounter (HOSPITAL_COMMUNITY): Payer: Self-pay | Admitting: Vascular Surgery

## 2020-09-08 NOTE — Progress Notes (Addendum)
Progress Note    09/08/2020 7:54 AM 1 Day Post-Op  Subjective:  No significant pain.   Vitals:   09/07/20 2315 09/08/20 0453  BP: (!) 142/85 (!) 153/95  Pulse: 78 65  Resp: 16 16  Temp: 98.3 F (36.8 C) 97.8 F (36.6 C)  SpO2: 98% 99%    Physical Exam: General appearance: Awake, alert in no apparent distress Cardiac: Heart rate and rhythm are regular Respirations: Nonlabored Incisions: Left groin lower leg wound dressing removed with small amount of venous oozing from old graft tunnel. Saline moistened Kerlix packed into wound. Extremities: Both feet are warm with intact sensation and motor function.    Component 1 d ago   Specimen Description WOUND   Special Requests LEFT LEG GRAFT   Gram Stain FEW WBC PRESENT, PREDOMINANTLY PMN  MODERATE GRAM POSITIVE COCCI IN CLUSTERS  Performed at [facility], [address] [place], [place] [address]   Culture PENDING   Report Status PENDING     CBC    Component Value Date/Time   WBC 6.5 09/07/2020 0454   RBC 4.17 (L) 09/07/2020 0454   HGB 9.9 (L) 09/07/2020 0454   HGB 12.2 (L) 07/22/2020 1150   HCT 29.0 (L) 09/07/2020 0454   HCT 37.4 (L) 07/22/2020 1150   PLT 437 (H) 09/07/2020 0454   PLT 419 07/22/2020 1150   MCV 69.5 (L) 09/07/2020 0454   MCV 76 (L) 07/22/2020 1150   MCH 23.7 (L) 09/07/2020 0454   MCHC 34.1 09/07/2020 0454   RDW 16.5 (H) 09/07/2020 0454   RDW 15.8 (H) 07/22/2020 1150   LYMPHSABS 1.5 09/05/2020 1641   MONOABS 0.4 09/05/2020 1641   EOSABS 0.1 09/05/2020 1641   BASOSABS 0.0 09/05/2020 1641    BMET    Component Value Date/Time   NA 137 09/07/2020 0454   NA 140 07/22/2020 1150   K 3.8 09/07/2020 0454   CL 106 09/07/2020 0454   CO2 24 09/07/2020 0454   GLUCOSE 89 09/07/2020 0454   BUN 8 09/07/2020 0454   BUN 14 07/22/2020 1150   CREATININE 0.89 09/07/2020 0454   CREATININE 1.02 01/20/2017 0908   CALCIUM 8.7 (L) 09/07/2020 0454   GFRNONAA >60 09/07/2020 0454   GFRAA >60 11/14/2019  0508     Intake/Output Summary (Last 24 hours) at 09/08/2020 0754 Last data filed at 09/08/2020 0502 Gross per 24 hour  Intake 1187.68 ml  Output 550 ml  Net 637.68 ml    HOSPITAL MEDICATIONS Scheduled Meds:  atorvastatin  40 mg Oral Daily   buPROPion  150 mg Oral q morning   clopidogrel  75 mg Oral Daily   docusate sodium  100 mg Oral Daily   enoxaparin (LOVENOX) injection  40 mg Subcutaneous Q24H   lidocaine (PF)  5 mL Intradermal Once   pantoprazole  40 mg Oral Daily   Continuous Infusions:  sodium chloride     ceFEPime (MAXIPIME) IV 2 g (09/08/20 7628)   magnesium sulfate bolus IVPB     metronidazole 500 mg (09/08/20 3151)   vancomycin 750 mg (09/08/20 0008)   PRN Meds:.sodium chloride, acetaminophen **OR** acetaminophen, alum & mag hydroxide-simeth, hydrALAZINE, labetalol, magnesium sulfate bolus IVPB, metoprolol tartrate, morphine injection, ondansetron, oxyCODONE-acetaminophen, potassium chloride  Assessment and Plan: Incision and drainage of left proximal calf wound with excision of distal femoropopliteal prosthetic bypass graft. Early micro results show GPC.  Will leave current dressing in place and defer wound VAC for now. Afebrile. Continue antibiotics.  -DVT prophylaxis:  Lovenox   Wendi Maya, PA-C [facility] [phone] 09/08/2020  7:54 AM   I have seen and evaluated the patient. I agree with the PA note as documented above.  Postop day 1 status post I&D of the left proximal calf wound with excision of the distal femoropopliteal bypass graft.  Cultures are showing gram-positive cocci.  We will continue broad-spectrum antibiotics and wait on final antibiotics.  We did change his dressing this morning and this remains fairly oozy so we will continue wet-to-dry's.  We will do the next dressing change tomorrow.  Hopefully transfer to 4 E.  Cephus Shelling, MD [facility] [place] Office: [phone]

## 2020-09-08 NOTE — Anesthesia Postprocedure Evaluation (Signed)
Anesthesia Post Note  Patient: SELDON BARRELL  Procedure(s) Performed: IRRIGATION AND DEBRIDEMENT LEFT LEG WITH EXCISION OF LEFT LEG DISTAL BYPASS GRAFT (Left: Leg Lower)     Patient location during evaluation: PACU Anesthesia Type: General Level of consciousness: awake and alert Pain management: pain level controlled Vital Signs Assessment: post-procedure vital signs reviewed and stable Respiratory status: spontaneous breathing, nonlabored ventilation, respiratory function stable and patient connected to nasal cannula oxygen Cardiovascular status: blood pressure returned to baseline and stable Postop Assessment: no apparent nausea or vomiting Anesthetic complications: no   No notable events documented.  Last Vitals:  Vitals:   09/08/20 1209 09/08/20 1500  BP: (!) 141/97 139/90  Pulse: 62 60  Resp: 18 18  Temp: 36.8 C 37 C  SpO2: 100% 100%    Last Pain:  Vitals:   09/08/20 1500  TempSrc: Oral  PainSc:    Pain Goal: Patients Stated Pain Goal: 3 (09/07/20 2035)                 Kendall Justo

## 2020-09-08 NOTE — Telephone Encounter (Signed)
error 

## 2020-09-08 NOTE — Progress Notes (Addendum)
Pharmacy Antibiotic Note  Benjamin [name] is a 60 y.o. male admitted on 09/05/2020 with Left-below knee popliteal incision  wound infection .  Pharmacy has been consulted for vancomycin dosing. Cefepime and Flagyl per MD. CTA showing thrombosed left femoral to popliteal bypass graft and adjacent fluid collection around the distal bypass graft. Patient had incision and drainage of wound in the OR on 7/18. Wound cultures were sent and are currently pending.   WBC are within normal limits. Afebrile.   Plan: Continue Vancomycin 750 mg every 12h.  Pharmacy to adjust dosing per renal function Follow-up cultures for ability to narrow Follow vascular recommendations  Height: 5\' 7"  (170.2 cm) Weight: 65.8 kg (145 lb) IBW/kg (Calculated) : 66.1  Temp (24hrs), Avg:98.1 F (36.7 C), Min:97.6 F (36.4 C), Max:98.4 F (36.9 C)  Recent Labs  Lab 09/05/20 1259 09/05/20 1641 09/07/20 0454  WBC  --  9.4 6.5  CREATININE 0.90 0.85 0.89    Estimated Creatinine Clearance: 81.1 mL/min (by C-G formula based on SCr of 0.89 mg/dL).    Allergies  Allergen Reactions   Aspirin Nausea And Vomiting   Garlic Other (See Comments)    sneezing   Antimicrobials this admission: Vancomycin 7/16 >>  Zosyn 7/16 >>7/17 Cefepime 7/17 >> Flagyl 7/17 >>  Microbiology results: 7/18 L-leg wound >> (stain w/ mod GPC clusters) 7/18 L-leg graft wound >> (stain w/ mod GPC clusters) 7/17 MRSA PCR negative  Thank you for allowing pharmacy to be a part of this patient's care.  8/17, PharmD, BCPS, BCCCP Clinical Pharmacist Please refer to [facility] for [facility] Pharmacy numbers 09/08/2020 1:46 PM

## 2020-09-09 MED ORDER — DIPHENHYDRAMINE HCL 25 MG PO CAPS
50.0000 mg | ORAL_CAPSULE | Freq: Once | ORAL | Status: AC
  Administered 2020-09-09: 50 mg via ORAL
  Filled 2020-09-09: qty 2

## 2020-09-09 NOTE — Progress Notes (Addendum)
  Progress Note    09/09/2020 8:48 AM 2 Days Post-Op  Subjective:  No complaints   Vitals:   09/08/20 2308 09/09/20 0511  BP: 121/84 120/86  Pulse: (!) 57 63  Resp: 18 18  Temp: 98.7 F (37.1 C) 99 F (37.2 C)  SpO2: 96% 98%   Physical Exam: Lungs:  non labored Incisions:  L calf wound without purulence; healthy wound bed with granulating tissue Extremities:  L foot warm but dusky appearing toes Neurologic: A&O  CBC    Component Value Date/Time   WBC 6.5 09/07/2020 0454   RBC 4.17 (L) 09/07/2020 0454   HGB 9.9 (L) 09/07/2020 0454   HGB 12.2 (L) 07/22/2020 1150   HCT 29.0 (L) 09/07/2020 0454   HCT 37.4 (L) 07/22/2020 1150   PLT 437 (H) 09/07/2020 0454   PLT 419 07/22/2020 1150   MCV 69.5 (L) 09/07/2020 0454   MCV 76 (L) 07/22/2020 1150   MCH 23.7 (L) 09/07/2020 0454   MCHC 34.1 09/07/2020 0454   RDW 16.5 (H) 09/07/2020 0454   RDW 15.8 (H) 07/22/2020 1150   LYMPHSABS 1.5 09/05/2020 1641   MONOABS 0.4 09/05/2020 1641   EOSABS 0.1 09/05/2020 1641   BASOSABS 0.0 09/05/2020 1641    BMET    Component Value Date/Time   NA 137 09/07/2020 0454   NA 140 07/22/2020 1150   K 3.8 09/07/2020 0454   CL 106 09/07/2020 0454   CO2 24 09/07/2020 0454   GLUCOSE 89 09/07/2020 0454   BUN 8 09/07/2020 0454   BUN 14 07/22/2020 1150   CREATININE 0.89 09/07/2020 0454   CREATININE 1.02 01/20/2017 0908   CALCIUM 8.7 (L) 09/07/2020 0454   GFRNONAA >60 09/07/2020 0454   GFRAA >60 11/14/2019 0508    INR    Component Value Date/Time   INR 1.2 05/08/2020 1450     Intake/Output Summary (Last 24 hours) at 09/09/2020 0848 Last data filed at 09/09/2020 0842 Gross per 24 hour  Intake 240 ml  Output 1825 ml  Net -1585 ml     Assessment/Plan:  60 y.o. male is s/p L calf wound I & D 2 Days Post-Op   -L foot warm but toes appear dusky; continue to monitor -Healthy appearing wound bed L calf; wet to dry changed; can probably place wound vac now,will discuss with Dr.  Chestine Spore -Awaiting final culture to tailor antibiotics   Emilie Rutter, PA-C [facility] [phone] 09/09/2020 8:48 AM  I have seen and evaluated the patient. I agree with the PA note as documented above.  Dressing changed today to left below-knee wound where the I&D was performed and excision of the distal graft that had dehisced from the below-knee popliteal artery.  Awaiting culture data and gram-positive cocci so far.  Hopefully transfer to 4 E.  Would place order for wound VAC on the left below-knee popliteal wound.  He is not have any significant rest pain or wound problems in the left foot and we will have to monitor over time.  Had silent occlusion of the graft and now infected.  Cephus Shelling, MD [facility] [place] Office: [phone]

## 2020-09-09 NOTE — Progress Notes (Signed)
Call for report second time to the 4 east.

## 2020-09-09 NOTE — Progress Notes (Signed)
Pt arrived to unit from [facility] VSS, A/O x 4,  CCMD called ,CHG given, pt oriented to unit,Will continue to monitor.   Karna Christmas Austine Wiedeman, RN     09/09/20 1251  Vitals  Temp 98.4 F (36.9 C)  Temp Source Oral  BP (!) 151/90  MAP (mmHg) 108  BP Location Right Arm  BP Method Automatic  Patient Position (if appropriate) Lying  Pulse Rate (!) 58  Pulse Rate Source Dinamap  ECG Heart Rate 60  Resp 20  Level of Consciousness  Level of Consciousness Alert  Oxygen Therapy  SpO2 100 %  O2 Device Room Air  O2 Flow Rate (L/min) 0 L/min  MEWS Score  MEWS Temp 0  MEWS Systolic 0  MEWS Pulse 0  MEWS RR 0  MEWS LOC 0  MEWS Score 0  MEWS Score Color [place] Si

## 2020-09-09 NOTE — Progress Notes (Signed)
Pt c/o not being able to sleep and would like medication. No orders for sleep meds currently on pt's list. Provider paged. Awaiting new orders.

## 2020-09-09 NOTE — Progress Notes (Signed)
Mobility Specialist: Progress Note   09/09/20 1820  Mobility  Activity Ambulated in hall  Level of Assistance Modified independent, requires aide device or extra time  Assistive Device Front wheel walker  Distance Ambulated (ft) 256 ft  Mobility Ambulated with assistance in hallway  Mobility Response Tolerated well  Mobility performed by Mobility specialist  $Mobility charge 1 Mobility   Pre-Mobility: 65 HR Post-Mobility: 72 HR  Pt c/o 5/10 pain in LLE during ambulation which he said was a 4/10 towards the end of ambulation. Pt is sitting EOB with call bell and phone at his side.   [facility] Vale Peraza Mobility Specialist Mobility Specialist Phone: [phone]

## 2020-09-09 NOTE — Consult Note (Signed)
WOC Nurse Consult Note: Reason for Consult: placement NPWT dressing left calf wound Wound type: surgical  Pressure Injury POA: NA Measurement: 10cm x 3cm x 4cm  Wound bed:100% clean; slightly deeper than appears Drainage (amount, consistency, odor) serous Periwound: intact  Dressing procedure/placement/frequency: Filled wound with  _2__ piece of black foam Sealed NPWT dressing at HG Patient received PO pain medication per bedside nurse Patient tolerated procedure well  WOC nurse will continue to provide NPWT dressing changed due to the complexity of the dressing change. Needs to have wound packed fully  Aquarius Latouche [facility], CNS, [employer] [phone]

## 2020-09-10 ENCOUNTER — Inpatient Hospital Stay: Payer: Self-pay

## 2020-09-10 MED ORDER — METRONIDAZOLE 500 MG PO TABS
500.0000 mg | ORAL_TABLET | Freq: Three times a day (TID) | ORAL | Status: DC
  Administered 2020-09-10: 500 mg via ORAL
  Filled 2020-09-10: qty 1

## 2020-09-10 MED ORDER — CEFAZOLIN SODIUM-DEXTROSE 2-4 GM/100ML-% IV SOLN
2.0000 g | Freq: Three times a day (TID) | INTRAVENOUS | Status: DC
  Administered 2020-09-10 – 2020-09-11 (×3): 2 g via INTRAVENOUS
  Filled 2020-09-10 (×5): qty 100

## 2020-09-10 NOTE — Progress Notes (Signed)
Pharmacy Antibiotic Note  Benjamin [name] is a 60 y.o. male admitted on 09/05/2020 with Left-below knee popliteal incision  wound infection .  Pharmacy has been consulted for vancomycin dosing. Cefepime and Flagyl per MD. CTA showing thrombosed left femoral to popliteal bypass graft and adjacent fluid collection around the distal bypass graft. Patient had incision and drainage of wound in the OR on 7/18. Wound cultures were sent and are currently pending.   Pt has been on D5 vanc/cefepime/flagyl his complicated wound infection that would likely require 6 wks of abx. His cultures have grown out pans sens staph aureus and staph lugdunensis. D/w Wendi Maya and we will optimize to high dose cefazolin.   CrCl~81 ml/min  Plan: Dc vanc/cefepime/flagyl Cefazolin 2g IV q8  Height: 5\' 7"  (170.2 cm) Weight: 65.8 kg (145 lb) IBW/kg (Calculated) : 66.1  Temp (24hrs), Avg:98.4 F (36.9 C), Min:98.1 F (36.7 C), Max:98.7 F (37.1 C)  Recent Labs  Lab 09/05/20 1259 09/05/20 1641 09/07/20 0454  WBC  --  9.4 6.5  CREATININE 0.90 0.85 0.89     Estimated Creatinine Clearance: 81.1 mL/min (by C-G formula based on SCr of 0.89 mg/dL).    Allergies  Allergen Reactions   Aspirin Nausea And Vomiting   Garlic Other (See Comments)    sneezing   Antimicrobials this admission: Vancomycin 7/16 >> 7/21 Zosyn 7/16 >>7/17 Cefepime 7/17 >>7/21 Flagyl 7/17 >>7/21 Cefazolin 7/21>>  Microbiology results: 7/18 L-leg wound >> STAPHYLOCOCCUS LUGDUNENSIS pan sens 7/18 L-leg graft wound >> MSSA 7/17 MRSA PCR negative  8/17, PharmD, [place], AAHIVP, CPP Infectious Disease Pharmacist 09/10/2020 2:03 PM

## 2020-09-10 NOTE — TOC Progression Note (Addendum)
Transition of Care [facility]) - Progression Note    Patient Details  Name: LISTON THUM MRN: [id number] Date of Birth: 1960-07-06  [facility] CM/SW Contact  Leone Haven, RN Phone Number: 09/10/2020, 2:41 PM  Clinical Narrative:    NCM spoke with patient , offered choice, he states he does not have a preference for [facility] for iv abx.  NCM contacted Jeri Modena, she will provide the iv medications for  iv abx and wound vac. will set up with [facility] .  Pam states will have to get authorization for medications.  Most likely will plan for dc tomorrow.  NCM contacted French Ana at [facility] for wound vac also. Will need to get signature.  Vac scripts sent to [facility].  Patient does not have a picc line yet.  NCM spoke with  Emilie Rutter.  He states they are not sure if will need iv abx's yet.  He will find out and let this NCM know. Per Emilie Rutter Patient will go home with iv abx, picc will be placed.  Per Traci she states wound vac should be her by noon tomorrow.  [facility] will do one wound vac change on either Monday and then the patient will go to the office on Friday to do the other wound vac change per [facility].       Expected Discharge Plan and Services                                                 Social Determinants of Health (SDOH) Interventions    Readmission Risk Interventions No flowsheet data found.

## 2020-09-10 NOTE — Progress Notes (Addendum)
Progress Note    09/10/2020 7:30 AM 3 Days Post-Op  Subjective:  c/o mild left foot pain and soreness of left calf   Vitals:   09/10/20 0024 09/10/20 0450  BP: 135/89 (!) 150/84  Pulse: 60 66  Resp: 19 18  Temp: 98.7 F (37.1 C) 98.1 F (36.7 C)  SpO2: 99% 98%    Physical Exam: General appearance: Awake, alert in no apparent distress Cardiac: Heart rate and rhythm are regular Respirations: Nonlabored Incisions: Right lower leg wound with VAC in place. Extremities: Both feet are warm with intact sensation. He had decreased plantar and dorsiflexion of left foot. Pulse/Doppler exam: Monophasic left dorsalis pedis, posterior tibial and peroneal artery Doppler signals    CBC    Component Value Date/Time   WBC 6.5 09/07/2020 0454   RBC 4.17 (L) 09/07/2020 0454   HGB 9.9 (L) 09/07/2020 0454   HGB 12.2 (L) 07/22/2020 1150   HCT 29.0 (L) 09/07/2020 0454   HCT 37.4 (L) 07/22/2020 1150   PLT 437 (H) 09/07/2020 0454   PLT 419 07/22/2020 1150   MCV 69.5 (L) 09/07/2020 0454   MCV 76 (L) 07/22/2020 1150   MCH 23.7 (L) 09/07/2020 0454   MCHC 34.1 09/07/2020 0454   RDW 16.5 (H) 09/07/2020 0454   RDW 15.8 (H) 07/22/2020 1150   LYMPHSABS 1.5 09/05/2020 1641   MONOABS 0.4 09/05/2020 1641   EOSABS 0.1 09/05/2020 1641   BASOSABS 0.0 09/05/2020 1641    BMET    Component Value Date/Time   NA 137 09/07/2020 0454   NA 140 07/22/2020 1150   K 3.8 09/07/2020 0454   CL 106 09/07/2020 0454   CO2 24 09/07/2020 0454   GLUCOSE 89 09/07/2020 0454   BUN 8 09/07/2020 0454   BUN 14 07/22/2020 1150   CREATININE 0.89 09/07/2020 0454   CREATININE 1.02 01/20/2017 0908   CALCIUM 8.7 (L) 09/07/2020 0454   GFRNONAA >60 09/07/2020 0454   GFRAA >60 11/14/2019 0508     Intake/Output Summary (Last 24 hours) at 09/10/2020 0730 Last data filed at 09/10/2020 0518 Gross per 24 hour  Intake --  Output 1550 ml  Net -1550 ml    HOSPITAL MEDICATIONS Scheduled Meds:  atorvastatin  40 mg  Oral Daily   buPROPion  150 mg Oral q morning   clopidogrel  75 mg Oral Daily   docusate sodium  100 mg Oral Daily   enoxaparin (LOVENOX) injection  40 mg Subcutaneous Q24H   lidocaine (PF)  5 mL Intradermal Once   pantoprazole  40 mg Oral Daily   Continuous Infusions:  sodium chloride     ceFEPime (MAXIPIME) IV 2 g (09/09/20 2348)   magnesium sulfate bolus IVPB     metronidazole 500 mg (09/10/20 0703)   vancomycin 750 mg (09/10/20 0051)   PRN Meds:.sodium chloride, acetaminophen **OR** acetaminophen, alum & mag hydroxide-simeth, hydrALAZINE, labetalol, magnesium sulfate bolus IVPB, metoprolol tartrate, morphine injection, ondansetron, oxyCODONE-acetaminophen, potassium chloride  Assessment and Plan: POD 3 Incision and drainage of left proximal calf wound with excision of distal femoropopliteal prosthetic bypass graft. Silent occlusion of left re-do fem-pop bypass with infection. Early micro results show Staph species; susceptibilities pending. wound VAC in place, change tomorrow. Remains afebrile. Continue IV antibiotics.   -DVT prophylaxis:  Lovenox   Wendi Maya, PA-C [facility] [phone] 09/10/2020  7:30 AM   I have seen and evaluated the patient. I agree with the PA note as documented above.  Status post I&D of left  below-knee popliteal incision with excision of distal bypass graft that was silently occluded.  Cultures growing staph aureus and staph lugdunesis.  Discussed awaiting sensitivities with patient.  Anticipate ultimately require 6 weeks of antibiotics.  VAC placed to left below-knee popliteal incision.  Also will likely discharge with VAC.  Not have any significant left lower extremity symptoms and again graft was silently occluded on presentation.  Cephus Shelling, MD [facility] [place] Office: [phone]   ADDENDUM 09/10/20: Case was discussed with [facility] team.  Patient's insurance will only allow [facility] RN to  change wound vac once a week.  We will also plan on changing vac in the office once a week for a total of twice weekly dressing changes.  (Monday and Thursday or Tuesday and Friday)

## 2020-09-11 LAB — BASIC METABOLIC PANEL
Anion gap: 9 (ref 5–15)
BUN: 8 mg/dL (ref 8–23)
CO2: 24 mmol/L (ref 22–32)
Calcium: 8.8 mg/dL — ABNORMAL LOW (ref 8.9–10.3)
Chloride: 102 mmol/L (ref 98–111)
Creatinine, Ser: 0.76 mg/dL (ref 0.61–1.24)
GFR, Estimated: >60 mL/min (ref >60)
Glucose, Bld: 104 mg/dL — ABNORMAL HIGH (ref 70–99)
Potassium: 3.8 mmol/L (ref 3.5–5.1)
Sodium: 135 mmol/L (ref 135–145)

## 2020-09-11 MED ORDER — CEFAZOLIN IV (FOR PTA / DISCHARGE USE ONLY): 2.0000 g | Freq: Three times a day (TID) | INTRAVENOUS | 0 refills | Status: DC

## 2020-09-11 MED ORDER — SODIUM CHLORIDE 0.9% FLUSH: 10.0000 mL | Freq: Two times a day (BID) | INTRAVENOUS | Status: DC

## 2020-09-11 MED ORDER — HEPARIN SOD (PORK) LOCK FLUSH 100 UNIT/ML IV SOLN
250.0000 [IU] | INTRAVENOUS | Status: AC | PRN
  Administered 2020-09-11: 250 [IU]
  Filled 2020-09-11: qty 2.5

## 2020-09-11 MED ORDER — OXYCODONE-ACETAMINOPHEN 10-325 MG PO TABS: 1.0000 | ORAL_TABLET | Freq: Four times a day (QID) | ORAL | 0 refills | Status: DC | PRN

## 2020-09-11 MED ORDER — SODIUM CHLORIDE 0.9% FLUSH
10.0000 mL | INTRAVENOUS | Status: DC | PRN
  Administered 2020-09-11: 10 mL

## 2020-09-11 MED ORDER — CHLORHEXIDINE GLUCONATE CLOTH 2 % EX PADS
6.0000 | MEDICATED_PAD | Freq: Every day | CUTANEOUS | Status: DC
  Administered 2020-09-11: 6 via TOPICAL

## 2020-09-11 NOTE — Progress Notes (Signed)
Peripherally Inserted Central Catheter Placement  The IV Nurse has discussed with the patient and/or persons authorized to consent for the patient, the purpose of this procedure and the potential benefits and risks involved with this procedure.  The benefits include less needle sticks, lab draws from the catheter, and the patient may be discharged home with the catheter. Risks include, but not limited to, infection, bleeding, blood clot (thrombus formation), and puncture of an artery; nerve damage and irregular heartbeat and possibility to perform a PICC exchange if needed/ordered by physician.  Alternatives to this procedure were also discussed.  Bard Power PICC patient education guide, fact sheet on infection prevention and patient information card has been provided to patient /or left at bedside.    PICC Placement Documentation  PICC Single Lumen 09/11/20 Right Basilic 43 cm 1 cm (Active)  Indication for Insertion or Continuance of Line Home intravenous therapies (PICC only) 09/11/20 0822  Exposed Catheter (cm) 1 cm 09/11/20 3953  Site Assessment Clean;Dry;Intact 09/11/20 2023  Line Status Flushed;Blood return noted;Saline locked 09/11/20 0822  Dressing Type Transparent 09/11/20 0822  Dressing Status Clean;Dry;Intact 09/11/20 0822  Antimicrobial disc in place? Yes 09/11/20 0822  Dressing Intervention New dressing;Other (Comment) 09/11/20 0822  Dressing Change Due 09/18/20 09/11/20 3435       Reginia Forts [place]ce 09/11/2020, 8:23 AM

## 2020-09-11 NOTE — Evaluation (Signed)
Physical Therapy Evaluation and Discharge Patient Details Name: Benjamin [name] MRN: [id number] DOB: [birth year] Today's Date: 09/11/2020   History of Present Illness  Pt is a 60 y/o male admitted 7/16 secondary to LLE abscess. Pt is s/p Incision and drainage of left proximal calf wound with excision of distal femoropopliteal prosthetic bypass graft on 7/18. Pt also had PICC line placed during admission. PMH includes HTN and PAD s/p fem pop.  Clinical Impression  Patient evaluated by Physical Therapy with no further acute PT needs identified. All education has been completed and the patient has no further questions. Pt overall at a mod I level for transfers and gait using RW; supervision for safety for stairs. Overall steady with no LOB noted. Reports sister can assist as needed at home. Would likely benefit from [facility] to assist with managing wound vac and other medical needs. See below for any follow-up Physical Therapy or equipment needs. PT is signing off. Thank you for this referral. If needs change, please re-consult.      Follow Up Recommendations No PT follow up (would benefit from [facility])    Equipment Recommendations  None recommended by PT    Recommendations for Other Services       Precautions / Restrictions Precautions Precautions: Other (comment) Precaution Comments: wound vac Restrictions Weight Bearing Restrictions: No      Mobility  Bed Mobility Overal bed mobility: Modified Independent                  Transfers Overall transfer level: Modified independent Equipment used: Rolling walker (2 wheeled)                Ambulation/Gait Ambulation/Gait assistance: Modified independent (Device/Increase time) Gait Distance (Feet): 200 Feet Assistive device: Rolling walker (2 wheeled) Gait Pattern/deviations: Step-through pattern;Decreased stride length Gait velocity: Mildly decreased   General Gait Details: Overall steady gait with use of RW. No overt LOB  noted. Mild decrease in gait speed.  Stairs Stairs: Yes Stairs assistance: Supervision Stair Management: Two rails;Step to pattern;Forwards Number of Stairs: 4 General stair comments: Overall steady stair navigation. Cues for LE sequencing. Supervision for safety.  Wheelchair Mobility    Modified Rankin (Stroke Patients Only)       Balance Overall balance assessment: No apparent balance deficits (not formally assessed)                                           Pertinent Vitals/Pain Pain Assessment: 0-10 Pain Score: 6  Pain Location: L calf Pain Descriptors / Indicators: Aching Pain Intervention(s): Limited activity within patient's tolerance;Monitored during session;Repositioned    Home Living Family/patient expects to be discharged to:: Private residence Living Arrangements: Other relatives (sister) Available Help at Discharge: Family;Available 24 hours/day Type of Home: House Home Access: Stairs to enter Entrance Stairs-Rails: Left;Right;Can reach both Entrance Stairs-Number of Steps: 4 Home Layout: One level Home Equipment: Environmental consultant - 2 wheels      Prior Function Level of Independence: Independent with assistive device(s)         Comments: Uses RW for ambulation     Hand Dominance        Extremity/Trunk Assessment   Upper Extremity Assessment Upper Extremity Assessment: Overall WFL for tasks assessed;RUE deficits/detail RUE Deficits / Details: s/p PICC line    Lower Extremity Assessment Lower Extremity Assessment: LLE deficits/detail LLE Deficits / Details: Deficits consistent with post  op pain and weakness. Wound vac attached to thigh    Cervical / Trunk Assessment Cervical / Trunk Assessment: Normal  Communication   Communication: No difficulties  Cognition Arousal/Alertness: Awake/alert Behavior During Therapy: WFL for tasks assessed/performed Overall Cognitive Status: Within Functional Limits for tasks assessed                                         General Comments      Exercises     Assessment/Plan    PT Assessment Patent does not need any further PT services  PT Problem List         PT Treatment Interventions      PT Goals (Current goals can be found in the Care Plan section)  Acute Rehab PT Goals Patient Stated Goal: to go home PT Goal Formulation: With patient Time For Goal Achievement: 09/11/20 Potential to Achieve Goals: Good    Frequency     Barriers to discharge        Co-evaluation               AM-PAC PT "6 Clicks" Mobility  Outcome Measure Help needed turning from your back to your side while in a flat bed without using bedrails?: None Help needed moving from lying on your back to sitting on the side of a flat bed without using bedrails?: None Help needed moving to and from a bed to a chair (including a wheelchair)?: None Help needed standing up from a chair using your arms (e.g., wheelchair or bedside chair)?: None Help needed to walk in hospital room?: None Help needed climbing 3-5 steps with a railing? : None 6 Click Score: 24    End of Session   Activity Tolerance: Patient tolerated treatment well Patient left: in bed (sitting EOB) Nurse Communication: Mobility status PT Visit Diagnosis: Other symptoms and signs involving the nervous system (R29.898);Pain Pain - Right/Left: Left Pain - part of body: Leg    Time: 4650-3546 PT Time Calculation (min) (ACUTE ONLY): 17 min   Charges:   PT Evaluation $PT Eval Low Complexity: 1 Low          Cindee Salt, DPT  Acute Rehabilitation Services  Pager: [phone] Office: [phone]   Lehman Prom 09/11/2020, 9:18 AM

## 2020-09-11 NOTE — Progress Notes (Addendum)
Progress Note    09/11/2020 7:07 AM 4 Days Post-Op  Subjective:  Complains of decreased sensation in right foot, but denies pain. Using rolling walker with mobility specialist   Vitals:   09/10/20 2300 09/11/20 0419  BP: 136/85 134/81  Pulse: 62   Resp: 20 19  Temp: 98 F (36.7 C) (!) 97.5 F (36.4 C)  SpO2: 98% 98%    Physical Exam: General appearance: Awake, alert in no apparent distress Cardiac: Heart rate and rhythm are regular Respirations: Nonlabored Incisions: Left groin lower leg wound VAC dressing in place.  Extremities:  He had decreased plantar and dorsiflexion of left foot as well as decreased sensation as compared to his right foot. Foot is warm without skin breakdown Pulse/Doppler exam: Monophasic left dorsalis pedis, Doppler signal  CBC    Component Value Date/Time   WBC 6.5 09/07/2020 0454   RBC 4.17 (L) 09/07/2020 0454   HGB 9.9 (L) 09/07/2020 0454   HGB 12.2 (L) 07/22/2020 1150   HCT 29.0 (L) 09/07/2020 0454   HCT 37.4 (L) 07/22/2020 1150   PLT 437 (H) 09/07/2020 0454   PLT 419 07/22/2020 1150   MCV 69.5 (L) 09/07/2020 0454   MCV 76 (L) 07/22/2020 1150   MCH 23.7 (L) 09/07/2020 0454   MCHC 34.1 09/07/2020 0454   RDW 16.5 (H) 09/07/2020 0454   RDW 15.8 (H) 07/22/2020 1150   LYMPHSABS 1.5 09/05/2020 1641   MONOABS 0.4 09/05/2020 1641   EOSABS 0.1 09/05/2020 1641   BASOSABS 0.0 09/05/2020 1641    BMET    Component Value Date/Time   NA 137 09/07/2020 0454   NA 140 07/22/2020 1150   K 3.8 09/07/2020 0454   CL 106 09/07/2020 0454   CO2 24 09/07/2020 0454   GLUCOSE 89 09/07/2020 0454   BUN 8 09/07/2020 0454   BUN 14 07/22/2020 1150   CREATININE 0.89 09/07/2020 0454   CREATININE 1.02 01/20/2017 0908   CALCIUM 8.7 (L) 09/07/2020 0454   GFRNONAA >60 09/07/2020 0454   GFRAA >60 11/14/2019 0508     Intake/Output Summary (Last 24 hours) at 09/11/2020 0707 Last data filed at 09/10/2020 2258 Gross per 24 hour  Intake 600 ml  Output 2400  ml  Net -1800 ml    HOSPITAL MEDICATIONS Scheduled Meds:  atorvastatin  40 mg Oral Daily   buPROPion  150 mg Oral q morning   clopidogrel  75 mg Oral Daily   docusate sodium  100 mg Oral Daily   enoxaparin (LOVENOX) injection  40 mg Subcutaneous Q24H   lidocaine (PF)  5 mL Intradermal Once   pantoprazole  40 mg Oral Daily   Continuous Infusions:  sodium chloride      ceFAZolin (ANCEF) IV 2 g (09/11/20 0615)   magnesium sulfate bolus IVPB     PRN Meds:.sodium chloride, acetaminophen **OR** acetaminophen, alum & mag hydroxide-simeth, hydrALAZINE, labetalol, magnesium sulfate bolus IVPB, metoprolol tartrate, morphine injection, ondansetron, oxyCODONE-acetaminophen, potassium chloride  Assessment and Plan: POD 4 Incision and drainage of left proximal calf wound with excision of distal femoropopliteal prosthetic bypass graft. 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 PT eval today. I suspect he could use some home PT>>will follow-up with their recs wound VAC in place; [facility] arrangements made for weekly change at home. He will come to our office weekly as well. Awaiting PICC  -DVT prophylaxis:  Lovenox   Wendi Maya, PA-C [facility] [phone] 09/11/2020  7:07 AM   I have seen and evaluated the patient. I agree with the PA note as documented above.  Status post I&D of left calf wound where he had a redo femoropopliteal bypass with prosthetic.  The distal bypass was resected where there was dehiscence from the distal anastomosis at time of I&D.  Of note there was a silent occlusion given the bypass was thrombosed on CT when he came to the ED and has no significant left leg symptoms.  He has walked.  I can find a monophasic PT signal in the left ankle.  No signs of critical limb ischemia at this time.  PICC line placed and plan 6 weeks of Ancef for graft infection.  Continue VAC to calf  wound.  We will order home VAC.  Have discussed with pharmacy and antibiotics are ordered but need home health arranged for delivery and instruction on administration.  After discharge will need short order follow-up with Dr. Myra Gianotti.  Cephus Shelling, MD [facility]: [phone]

## 2020-09-11 NOTE — Progress Notes (Signed)
OPAT Order Details             .Outpatient Parenteral Antibiotic Therapy Consult  Until discontinued       Provider:  (Not yet assigned)  Question Answer Comment  Antibiotic Cefazolin (Ancef) IVPB   Indications for use bypass graft infection   End Date 10/17/2020

## 2020-09-11 NOTE — Plan of Care (Signed)
DISCHARGE NOTE HOME [name] to be discharged home per MD order. Discussed prescriptions and follow up appointments with the patient. Medication list explained in detail. Patient verbalized understanding.  Skin clean, dry and intact without evidence of skin break down, no evidence of skin tears noted. IV catheter discontinued intact. Site without signs and symptoms of complications. Dressing and pressure applied. Pt denies pain at the site currently. No complaints noted.  Patient converted over to home wound vac.  Educated patient on canister changes, how to connect tubing, how to trouble shoot possible leaks, and how to Administrator, arts.   An After Visit Summary (AVS) was printed and given to the patient. Patient escorted via wheelchair, and discharged home via private auto.  Arlice Colt, RN

## 2020-09-11 NOTE — Consult Note (Addendum)
WOC Nurse Consult Note: Reason for Consult: Vac dressing change to left calf full thickness post-op wound Refer to previous notes for measurements Wound bed:100% beefy red, mod amt pink drainage in the cannister. Pt was medicated for pain prior to the procedure and tolerated with mod amt discomfort.  It was difficult to remove inner deeper piece of sponge with forcepts, outer piece removed easily.  Moistened both with NS and pt had small amt bloody drainage when inner piece removed with forcepts.  Applied one piece black foam to cont suction and covered with ace wrap.  WOC will plan to change Mon if patient is still in the hospital at that time. Cammie Mcgee MSN, RN, CWOCN, [place], CNS [phone]

## 2020-09-11 NOTE — TOC Transition Note (Signed)
Transition of Care [facility]) - CM/SW Discharge Note   Patient Details  Name: Benjamin [name] MRN: [id number] Date of Birth: 1960/05/21  [facility]) CM/SW Contact:  Leone Haven, RN Phone Number: 09/11/2020, 2:11 PM   Clinical Narrative:    NCM spoke with patient , offered choice, he states he does not have a preference for [facility] for iv abx.  NCM contacted Jeri Modena, she will provide the iv medications for  iv abx and wound vac. will set up with [facility] .  Pam states will have to get authorization for medications.  Most likely will plan for dc tomorrow.  NCM contacted French Ana at [facility] for wound vac also. Will need to get signature.  Vac scripts sent to [place].  Patient does not have a picc line yet.  NCM spoke with  Benjamin [name].  He states they are not sure if will need iv abx's yet.  He will find out and let this NCM know. Per Benjamin [name] Patient will go home with iv abx, picc will be placed.  Per Traci she states wound vac should be her by noon tomorrow.  [facility] will do one wound vac change on either Monday and then the patient will go to the office on Friday to do the other wound vac change per [facility].    Final next level of care: Home w Home Health Services Barriers to Discharge: No Barriers Identified   Patient Goals and CMS Choice Patient states their goals for this hospitalization and ongoing recovery are:: return home CMS Medicare.gov Compare Post Acute Care list provided to:: Patient Choice offered to / list presented to : Patient  Discharge Placement                       Discharge Plan and Services                DME Arranged: Vac DME Agency: [facility] Date DME Agency Contacted: 09/10/20 Time DME Agency Contacted: 1400 Representative spoke with at DME Agency: Lambert Mody HH Arranged: RN HH Agency:  Antoine Poche) Date HH Agency Contacted: 09/10/20 Time HH Agency Contacted: 1400 Representative spoke with at [facility] Agency: Allison/Pam  Banker  Social Determinants of Health (SDOH) Interventions     Readmission Risk Interventions No flowsheet data found.

## 2020-09-12 LAB — AEROBIC/ANAEROBIC CULTURE W GRAM STAIN (SURGICAL/DEEP WOUND)

## 2020-09-14 NOTE — Discharge Summary (Signed)
Discharge Summary  Patient ID: Benjamin [name] [id number] 60 y.o. [birth year]  Admit date: 09/05/2020  Discharge date and time: 09/11/2020  5:32 PM   Admitting Physician: Marty Heck, MD   Discharge Physician: same  Admission Diagnoses: PAD (peripheral artery disease) ([place]) [I73.9] Abscess of left leg [L02.416]  Discharge Diagnoses: non healing surgical wound  Admission Condition: poor  Discharged Condition: fair  Indication for Admission: Nonhealing surgical wound of left lower extremity  Hospital Course: Mr. Melville Engen is a 60 year old male who underwent aortobifemoral bypass with left femoral to below the knee popliteal bypass in March 2022.  He presented to the emergency department on 09/05/2020 with a dehisced nonhealing left below the knee surgical wound.  He was started on broad-spectrum antibiotics.  On 09/07/2020 he was brought to the operating room by Dr. Carlis Abbott and underwent irrigation and debridement of wound.  He was found to have an exposed bypass graft which was occluded.  All prosthetic material was removed from this wound and popliteal artery was ligated.  He received wet-to-dry packing dressing changes over the next couple days.  A wound VAC dressing was then applied.  Based on culture data pharmacy recommended 2 g of Ancef to be received every 8 hours.  Transition of care team arranged home health RN to give home IV antibiotics as well as change the wound VAC dressing once a week.  He will have a wound VAC change 1 additional time a week in our office.  He was prescribed narcotic pain medication for continued postoperative pain control.  He was discharged home after home health IV antibiotics and VAC changes were arranged.  Consults: None  Treatments: surgery: Irrigation and debridement of left below the knee surgical wound by Dr. Carlis Abbott on 09/07/2020  Discharge Exam: See progress note 09/11/20 Vitals:   09/11/20 0838 09/11/20 1129  BP: 137/86 (!) 147/88  Pulse:  66 69  Resp: 15 18  Temp: (!) 97.3 F (36.3 C) (!) 97.4 F (36.3 C)  SpO2: 98% 99%     Disposition: Discharge disposition: 01-Home or Self Care       Patient Instructions:  Allergies as of 09/11/2020       Reactions   Aspirin Nausea And Vomiting   Garlic Other (See Comments)   sneezing        Medication List     TAKE these medications    atorvastatin 40 MG tablet Commonly known as: Lipitor Take 1 tablet (40 mg total) by mouth daily. What changed: when to take this   buPROPion 150 MG 24 hr tablet Commonly known as: WELLBUTRIN XL TAKE 1 TABLET (150 MG TOTAL) BY MOUTH EVERY MORNING.   ceFAZolin  IVPB Commonly known as: ANCEF Inject 2 g into the vein every 8 (eight) hours. Indication:  Bypass graft infection First Dose: No Last Day of Therapy: 10/17/20 Labs - Once weekly:  CBC/D and BMP, Labs - Every other week:  ESR and CRP Method of administration: IV Push Method of administration may be changed at the discretion of home infusion pharmacist based upon assessment of the patient and/or caregiver's ability to self-administer the medication ordered.   clopidogrel 75 MG tablet Commonly known as: Plavix Take 1 tablet (75 mg total) by mouth daily. What changed: when to take this   gabapentin 100 MG capsule Commonly known as: NEURONTIN TAKE 1 CAPSULE (100 MG) IN THE MORNING AND 2 CAPSULE (200 MG) AT BEDTIME What changed:  how much to take how to take this  when to take this additional instructions   lactose free nutrition Liqd Take 237 mLs by mouth daily.   LEG CRAMPS PM SL Place 2 tablets under the tongue at bedtime.   OVER THE COUNTER MEDICATION Take 1 capsule by mouth daily. Stacker energy pill   oxyCODONE-acetaminophen 10-325 MG tablet Commonly known as: Percocet Take 1 tablet by mouth every 6 (six) hours as needed for pain.   Vitamin D (Ergocalciferol) 1.25 MG (50000 UNIT) Caps capsule Commonly known as: DRISDOL Take 1 capsule (50,000 Units  total) by mouth 2 (two) times a week.       ASK your doctor about these medications    acetaminophen 500 MG tablet Commonly known as: TYLENOL Take 2 tablets (1,000 mg total) by mouth every 6 (six) hours as needed for mild pain or headache.               Discharge Care Instructions  (From admission, onward)           Start     Ordered   09/11/20 0000  Change dressing on IV access line weekly and PRN  (Home infusion instructions - [facility] )        09/11/20 1356           Activity: activity as tolerated Diet: regular diet Wound Care:  Wound VAC changes 2 times a week 2 nonhealing surgical wound of left calf  Follow-up with Dr. Trula Slade in 2 weeks.  Signed: Dagoberto Ligas, PA-C 09/14/2020 10:23 AM [facility] Office: [phone]

## 2020-09-16 ENCOUNTER — Telehealth: Payer: Self-pay | Admitting: Licensed Clinical Social Worker

## 2020-09-16 NOTE — Telephone Encounter (Signed)
LCSW called to check in with pt/pt family since recent hospital discharge.  Upcoming appt at [facility] tomorrow to have wound vac changed.  Attempted pt cell however it rang to voicemail and currently voicemail full. Was able to reach pt sister [place], [place] on file, at home phone and confirmed that pt doing well; he is in the other room resting. She has been administering iv abx 3x daily and feels she has done well with that so far. She is aware of appt tomorrow. Encouraged her to call me for any additional needs that may arise.   [name], MSW, LCSW [facility] Heart/Vascular Care Navigation  [phone]

## 2020-09-16 NOTE — Telephone Encounter (Deleted)
LCSW called to check in with pt/pt family since discharge.  Upcoming appt at [facility] tomorrow to have wound vac changed.  Attempted pt cell however it rang to voicemail and currently voicemail full. Was able to reach pt sister [place], [place] on file, at home phone and

## 2020-09-17 ENCOUNTER — Ambulatory Visit (INDEPENDENT_AMBULATORY_CARE_PROVIDER_SITE_OTHER): Payer: Medicaid Other | Admitting: Physician Assistant

## 2020-09-17 ENCOUNTER — Other Ambulatory Visit: Payer: Self-pay

## 2020-09-17 VITALS — BP 149/89 | HR 65 | Temp 98.1°F | Resp 20 | Ht 67.0 in | Wt 141.9 lb

## 2020-09-17 DIAGNOSIS — Z9889 Other specified postprocedural states: Secondary | ICD-10-CM

## 2020-09-17 DIAGNOSIS — S81802S Unspecified open wound, left lower leg, sequela: Secondary | ICD-10-CM

## 2020-09-17 MED ORDER — ATORVASTATIN CALCIUM 40 MG PO TABS: 40.0000 mg | ORAL_TABLET | Freq: Every day | ORAL | 12 refills | Status: DC

## 2020-09-17 MED ORDER — CLOPIDOGREL BISULFATE 75 MG PO TABS: 75.0000 mg | ORAL_TABLET | Freq: Every day | ORAL | 3 refills | Status: DC

## 2020-09-17 NOTE — Progress Notes (Signed)
POST OPERATIVE OFFICE NOTE    CC:  F/u for surgery  HPI:  This is a 60 y.o. male who is s/p Incision and drainage of left proximal calf wound with excision of distal femoropopliteal prosthetic bypass graft on 09/07/2020. He is on IV antibiotics for staph species.  His health benefits would only cover VAC change once weekly. Numbness of left foot persists. He is ambulating independently. He states he needs refills for statin and Plavix.  He did not bring a wound VAC sponge kit. Says he quit smoking.  Allergies  Allergen Reactions   Aspirin Nausea And Vomiting   Garlic Other (See Comments)    sneezing    Current Outpatient Medications  Medication Sig Dispense Refill   acetaminophen (TYLENOL) 500 MG tablet Take 2 tablets (1,000 mg total) by mouth every 6 (six) hours as needed for mild pain or headache. (Patient taking differently: Take 500 mg by mouth 3 (three) times daily.) 100 tablet 0   atorvastatin (LIPITOR) 40 MG tablet Take 1 tablet (40 mg total) by mouth daily. 90 tablet 12   buPROPion (WELLBUTRIN XL) 150 MG 24 hr tablet TAKE 1 TABLET (150 MG TOTAL) BY MOUTH EVERY MORNING. 90 tablet 1   ceFAZolin (ANCEF) IVPB Inject 2 g into the vein every 8 (eight) hours. Indication:  Bypass graft infection First Dose: No Last Day of Therapy: 10/17/20 Labs - Once weekly:  CBC/D and BMP, Labs - Every other week:  ESR and CRP Method of administration: IV Push Method of administration may be changed at the discretion of home infusion pharmacist based upon assessment of the patient and/or caregiver's ability to self-administer the medication ordered. 126 Units 0   clopidogrel (PLAVIX) 75 MG tablet Take 1 tablet (75 mg total) by mouth daily. 90 tablet 3   gabapentin (NEURONTIN) 100 MG capsule TAKE 1 CAPSULE (100 MG) IN THE MORNING AND 2 CAPSULE (200 MG) AT BEDTIME 90 capsule 2   Homeopathic Products (LEG CRAMPS PM SL) Place 2 tablets under the tongue at bedtime.     lactose free nutrition (BOOST) LIQD  Take 237 mLs by mouth daily.     OVER THE COUNTER MEDICATION Take 1 capsule by mouth daily. Stacker energy pill     oxyCODONE-acetaminophen (PERCOCET) 10-325 MG tablet Take 1 tablet by mouth every 6 (six) hours as needed for pain. 20 tablet 0   Vitamin D, Ergocalciferol, (DRISDOL) 1.25 MG (50000 UNIT) CAPS capsule Take 1 capsule (50,000 Units total) by mouth 2 (two) times a week. 24 capsule 0   No current facility-administered medications for this visit.     ROS:  See HPI    Physical Exam: General appearance: Awake, alert in no apparent distress Cardiac: Heart rate and rhythm are regular Respirations: Nonlabored Extremities: Wound is clean and granulating. Measures 9 x 2 x 3.5 cm deep. Chronic ischemic changes to left foot.   Assessment/Plan:  This is a 60 y.o. male who is s/p: incision and drainage of left proximal calf wound with excision of distal femoropopliteal prosthetic bypass graft on 09/07/2020. We had one VAC sponge kit and the dressing was replaced with good seal. Ecouraged him to eat as much protein as possible and take a daily MVI. I will refill his statin and Plavix. He was prescribed Percocet #20 at discharge so will not refill at this time. He isn't sure what day HH will return. I presume this will be Monday. Return to clinic in one week and explained he would need to bring  the sponge from [facility]. I will put in staff message to try to ensure [facility] will supply him with sponge kit.  Risa Grill, PA-C [facility] [phone]  Clinic MD:  Dr. Oneida Alar

## 2020-09-18 ENCOUNTER — Telehealth: Payer: Self-pay | Admitting: Licensed Clinical Social Worker

## 2020-09-18 NOTE — Telephone Encounter (Signed)
LCSW reviewed pt notes from [facility] appt yesterday.  Per notes pt did not bring dressing kit to office. He was able to get dressing change both Monday w/ HHRN and at [facility] office.   I reached out to [facility], RN, w/ [facility] who assisted w/ getting pt set up with home infusions. Plan at discharge was that a RN from [facility] is coming Mondays to assist pt with dressing changes and then pt will go for second change at [facility] office. She shares that pt got vac through [facility] and they would know if pt was sent with additional dressing change kits. LCSW reached out to [facility] w/ [facility] and confirmed pt should have about 3 boxes of dressing changes and 2 boxes of canisters. She encouraged me to let her know if something is missing or if pt needs additional kits.  LCSW called home phone, Benjamin [name] (pt sister, DPR on file) answered. Confirmed she was still doing well with home infusions. I inquired about dressing kits. She states they received them yesterday. I reminded her and pt that pt needs to bring one with him when he goes to in office dressing changes. Pt and pt sister stated understanding. Remain available for any ongoing questions/concerns and pt has my number.   I f/u with Katharine Look, [place], regarding the above and also offered to assist with any ongoing questions/concerns.   Westley Hummer, MSW, [place]  [phone]

## 2020-09-21 ENCOUNTER — Telehealth: Payer: Self-pay | Admitting: Licensed Clinical Social Worker

## 2020-09-21 ENCOUNTER — Telehealth: Payer: Self-pay

## 2020-09-21 NOTE — Telephone Encounter (Signed)
Received call from [facility] office. Pt and pt sister had contacted office as having issued getting in touch with nurse from [place]. LCSW called [facility] who is aware of challenges and states their nurse Mariane Duval will be in touch with the family. I passed this on to [facility]. Remain available for any additional questions/challenges that may arise.   [name], MSW, LCSW [facility] Heart/Vascular Care Navigation  [phone]

## 2020-09-21 NOTE — Telephone Encounter (Signed)
Received call from pt inquiring about nurse coming out due to wound vac not working properly. PT states "it's full and not working anymore" Pt stated "the nurse is suppose to come out today between 11:30am and 12:00pm today" I told pt to give some time that she may be running late. PT called back around 3:00pm and stated nurse had not came. I advised pt that I would call the agency to see if I could reach the nurse - left message with reception to give to nurse as the receptionist stated that was the only way to reach her. - Rec'vd call from [place], RN around 3:40pm - she stated she had called an left a voicemail around 9am about needing wound orders for the vac change. She also stated she was under the impression that pt would be going to the wound care center, advised I was not aware of that and the wound vac orders should have been sent when the pt was d/c-, [place] suggested that she would call tomorrow when she goes out to see the pt - faxed wound care orders

## 2020-09-24 ENCOUNTER — Telehealth: Payer: Self-pay

## 2020-09-24 ENCOUNTER — Other Ambulatory Visit: Payer: Self-pay

## 2020-09-24 ENCOUNTER — Ambulatory Visit (INDEPENDENT_AMBULATORY_CARE_PROVIDER_SITE_OTHER): Payer: Medicaid Other | Admitting: Physician Assistant

## 2020-09-24 VITALS — BP 141/97 | Temp 97.7°F | Resp 20 | Ht 67.0 in | Wt 143.8 lb

## 2020-09-24 DIAGNOSIS — S81802S Unspecified open wound, left lower leg, sequela: Secondary | ICD-10-CM

## 2020-09-24 DIAGNOSIS — M79605 Pain in left leg: Secondary | ICD-10-CM

## 2020-09-24 NOTE — Progress Notes (Signed)
    Postoperative Visit   History of Present Illness   ELDER DAVIDIAN is a 60 y.o. year old male who presents for postoperative follow-up.    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  There is diffuse disease throughout the right superficial femoral and popliteal artery with occlusion of the distal popliteal artery and reconstitution of the posterior tibial artery.  He ultimately decided to not pursue surgical revascularization at that time.   They returned in 2021 with a left toe ulcer.  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  He underwent stenting of a right common and external iliac stenosis.  Then on 11/13/2019 he had a right to left femoral-femoral bypass graft followed by left femoral to below-knee popliteal artery bypass graft with saphenous vein.  He was taken back to the operating room on 9/23 due to a bypass graft stenosis which was treated with balloon angioplasty.   The patient returned for surveillance ultrasound imaging in March of 2022,which showed that the femoral-femoral graft and the femoral-popliteal graft were occluded.  On 05/08/2020 he underwent an aortobifemoral bypass graft using a 16 x 8 bifurcated graft and a redo left femoral to below-knee popliteal bypass with PTFE.  He then developed left calf wound and underwent I&D with excision of distal femoral-popliteal bypass graft by Dr. Chestine Spore on 09/07/2020.  He is now being followed for nonhealing below the knee left lower extremity wound.  He has arranged home health for IV antibiotics as well as a wound VAC change once a week.  The second VAC change per week is done in the office.  He denies rest pain in his bilateral lower extremities.  He believes the wound is healing.   For VQI Use Only   PRE-ADM LIVING: Home  AMB STATUS: Ambulatory   Physical Examination    Vitals:   09/24/20 1059  BP: (!) 141/97  Resp: 20  Temp: 97.7 F (36.5 C)  TempSrc: Temporal  SpO2: 97%  Weight: 143 lb 12.8 oz (65.2 kg)  Height: 5\' 7"  (1.702 m)    LLE: Feet are symmetrically warm to touch.  Healthy wound bed of left popliteal wound      Medical Decision Making   BRAELON SPRUNG is a 60 y.o. year old male who presents s/p debridement of left below the knee popliteal wound.  Left calf wound appears to be healing well with healthy wound bed as pictured above Wound VAC was changed in the office Continue IV antibiotics for a total of 6 weeks postoperatively Continue weekly VAC changes with home health with an additional VAC change here in the office Patient is aware that he is still at risk for amputation however he is currently without rest pain or tissue loss of left foot He will follow-up in 1 week for VAC change   77 PA-C [facility] [place] Office: [phone]  Clinic MD: [phone]

## 2020-09-24 NOTE — Telephone Encounter (Signed)
I spoke with Tanya Nones from HR of [facility] in regards to Mr. Tera Mater (employee). She states she had previously been speaking with Christy H. About Mr Lintner's STD paperwork. She states she did not receive it and patient is now too far from intial date to receive benefits. She then requested a letter stating a approximate tie frame patient will need to continue being out of work.   After speaking with [employer] about the complexity of Mr. Piscitelli's conditions, he felt like patient should have a minimum of 90 days short term disability to see if Mr.Krabill's conditions will improve.   I spoke with Mr.Callins about this and he was also in agreement with this treatment plan. I advised he will need to contact his job and request Short Term Disability forms and pay our processing fee before this can be completed. Patient voiced his understanding.

## 2020-09-25 ENCOUNTER — Emergency Department (HOSPITAL_COMMUNITY)
Admission: EM | Admit: 2020-09-25 | Discharge: 2020-09-26 | Disposition: A | Discharge status: Home / Self Care | Payer: Medicaid Other | Attending: Emergency Medicine | Admitting: Emergency Medicine

## 2020-09-25 DIAGNOSIS — I10 Essential (primary) hypertension: Secondary | ICD-10-CM | POA: Diagnosis not present

## 2020-09-25 DIAGNOSIS — F17210 Nicotine dependence, cigarettes, uncomplicated: Secondary | ICD-10-CM | POA: Diagnosis not present

## 2020-09-25 DIAGNOSIS — M79605 Pain in left leg: Secondary | ICD-10-CM | POA: Insufficient documentation

## 2020-09-25 NOTE — ED Triage Notes (Addendum)
Pt bib [facility] from home. Stent placed today, complaints of discomfort and unable to feel around the area where the stent was placed. EMS reports possible LOC en route to facility, but unsure.   Vitals BP: 156/92 HR: 105 O2: 97%

## 2020-09-26 ENCOUNTER — Encounter (HOSPITAL_COMMUNITY): Payer: Self-pay

## 2020-09-26 ENCOUNTER — Other Ambulatory Visit: Payer: Self-pay

## 2020-09-26 MED ORDER — HYDROCODONE-ACETAMINOPHEN 5-325 MG PO TABS
1.0000 | ORAL_TABLET | Freq: Once | ORAL | Status: AC
  Administered 2020-09-26: 1 via ORAL
  Filled 2020-09-26: qty 1

## 2020-09-26 NOTE — ED Provider Notes (Signed)
[facility] Emergency Department Provider Note MRN:  [id number]  Arrival date & time: 09/26/20     Chief Complaint   Leg pain History of Present Illness   Benjamin [name] is a 60 y.o. year-old male with a history of PAD presenting to the ED with chief complaint of leg pain.  Patient had his wound VAC dressing changed today.  Since then is having some increased pain to the left leg.  Denies any numbness or weakness to the leg, pain is mild, constant.  Denies fever, no chest pain or shortness of breath, no other complaints.  Asking for oxycodone.  Review of Systems  A complete 10 system review of systems was obtained and all systems are negative except as noted in the HPI and PMH.   Patient's Health History    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   Bilateral femoral artery disease    Past Surgical History:  Procedure Laterality Date   ABDOMINAL AORTOGRAM W/LOWER EXTREMITY N/A 07/12/2016   Procedure: Abdominal Aortogram w/Lower Extremity;  Surgeon: Nada Libman, MD;  Location: [facility] INVASIVE CV LAB;  Service: Cardiovascular;  Laterality: N/A;  Bilateral extermity: Patent Renal As. No sig Dz in infrarenal Abd Aorta. Normal Bilat Iliac arteries. R SFA is 100% @ origin - recon in AK-Pop A. R PT A patent. L CFA occluded. L PFA recon @ origin, L SFA occluded w/ recon in AK Pop A.   ABDOMINAL AORTOGRAM W/LOWER EXTREMITY N/A 10/15/2019   Procedure: ABDOMINAL AORTOGRAM W/LOWER EXTREMITY;  Surgeon: Nada Libman, MD;  [facility];  Service: Cardiovascular;  Laterality: N/A;   AORTA - BILATERAL FEMORAL ARTERY BYPASS GRAFT N/A 05/08/2020   Procedure: AORTA BIFEMORAL BYPASS GRAFT;  Surgeon: Nada Libman, MD;  Location: [facility] OR;  Service: Vascular;  Laterality: N/A;   COLONOSCOPY  06/2016   never   ENDARTERECTOMY FEMORAL  Left 11/14/2019   Procedure: Left groin exploration, Redo left femoral artery exposure;  Surgeon: Nada Libman, MD;  Location: [facility] OR;  Service: Vascular;  Laterality: Left;   FEMORAL-FEMORAL BYPASS GRAFT N/A 11/13/2019   Procedure: BYPASS GRAFT FEMORAL-FEMORAL ARTERY RIGHT TO LEFT USING HEMASHIELD GOLD GRAFT 71mm x 30cm;  Surgeon: Nada Libman, MD;  Location: [facility] OR;  Service: Vascular;  Laterality: N/A;   FEMORAL-POPLITEAL BYPASS GRAFT Left 11/13/2019   Procedure: LEFT FEMORAL BELOW KNEE-POPLITEAL ARTERY USING NON-REVERSED GREATER SAPHENOUS VEIN;  Surgeon: Nada Libman, MD;  Location[facility] OR;  Service: Vascular;  Laterality: Left;   FEMORAL-POPLITEAL BYPASS GRAFT Left 05/08/2020   Procedure: LEFT REDO BYPASS GRAFT COMMON FEMORAL- BELOW KNEE POPLITEAL ARTERY USING PROPATEN GRAFT;  Surgeon: Nada Libman, MD;  Location: [facility] OR;  Service: Vascular;  Laterality: Left;   I & D EXTREMITY Left 09/07/2020   Procedure: IRRIGATION AND DEBRIDEMENT LEFT LEG WITH EXCISION OF LEFT LEG DISTAL BYPASS GRAFT;  Surgeon: Cephus Shelling, MD;  Location[facility] OR;  Service: Vascular;  Laterality: Left;   LOWER EXTREMITY ANGIOGRAM Left 11/14/2019   Procedure: LEFT LOWER EXTREMITY ANGIOGRAM, BYPASS GRAFT ANGIOPLASTY;  Surgeon: Nada Libman, MD;  Location[facility] OR;  Service: Vascular;  Laterality: Left;   NO PAST SURGERIES  06/2016   PERIPHERAL VASCULAR INTERVENTION  10/15/2019   Procedure: PERIPHERAL VASCULAR INTERVENTION;  Surgeon: Nada Libman, MD;  [facility];  Service: Cardiovascular;;  Rt Iliac    Family History  Problem Relation Age of Onset   Arthritis Mother    COPD Father    Stroke Father    Multiple sclerosis Sister     Social History   Socioeconomic History   Marital status: Single    Spouse name: Not on file   Number of children: Not on file   Years of education: 12   Highest education level: Not on file  Occupational History   Not on file  Tobacco Use   Smoking  status: Every Day    Packs/day: 1.50    Years: 29.00    Pack years: 43.50    Types: Cigarettes   Smokeless tobacco: Never   Tobacco comments:    he is down to 1 PPD - 07/22/2020  Vaping Use   Vaping Use: Never used  Substance and Sexual Activity   Alcohol use: No   Drug use: No   Sexual activity: Not Currently  Other Topics Concern   Not on file  Social History Narrative   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   He lives with his sister and her son.   Walks every day to work at least 15+ minutes.   Social Determinants of Health   Financial Resource Strain: High Risk   Difficulty of Paying Living Expenses: Hard  Food Insecurity: No Food Insecurity   Worried About Programme researcher, broadcasting/film/video in the Last Year: Never true   Ran Out of Food in the Last Year: Never true  Transportation Needs: No Transportation Needs   Lack of Transportation (Medical): No   Lack of Transportation (Non-Medical): No  Physical Activity: Not on file  Stress: Not on file  Social Connections: Not on file  Intimate Partner Violence: Not on file     Physical Exam   Vitals:   09/25/20 2334  BP: (!) 178/109  Pulse: (!) 112  Resp: 20  Temp: 98.1 F (36.7 C)  SpO2: 100%    CONSTITUTIONAL: Well-appearing, NAD NEURO:  Alert and oriented x 3, no focal deficits EYES:  eyes equal and reactive ENT/NECK:  no LAD, no JVD CARDIO: Regular rate, well-perfused, normal S1 and S2 PULM:  CTAB no wheezing or rhonchi GI/GU:  normal bowel sounds, non-distended, non-tender MSK/SPINE:  No gross deformities, no edema SKIN:  no rash, atraumatic PSYCH:  Appropriate speech and behavior  *Additional and/or pertinent findings included in MDM below  Diagnostic and Interventional Summary    EKG Interpretation  Date/Time:    Ventricular Rate:    PR Interval:    QRS Duration:   QT Interval:    QTC Calculation:   R Axis:     Text Interpretation:         Labs Reviewed - No data to display  No orders to  display    Medications  HYDROcodone-acetaminophen (NORCO/VICODIN) 5-325 MG per tablet 1 tablet (1 tablet Oral Given 09/26/20 0116)     Procedures  /  Critical Care Procedures  ED Course and Medical Decision Making  I have reviewed the triage vital signs, the nursing notes, and pertinent available records from the EMR.  Listed above are laboratory and imaging tests that I personally ordered, reviewed, and interpreted and then considered in my medical decision making (see below for details).  Patient is overall well-appearing with reassuring vital signs, heart rate on my assessment in the 80s.  Leg is well-appearing, wound VAC  is in place, there is no erythema, there is no evidence of infection.  The limb is neurovascularly intact distally.  He initially described the pain as a numbness but he has normal and symmetric strength and sensation to bilateral lower extremities.  He is here mostly for more pain control.  Specifically asking for oxycodone tablets.  Nothing to suggest emergent process, I see no signs of graft failure or ischemic related pain.  Likely just having some pain after dressing change.  Provided with single dose of Norco, advised to follow-up with his regular doctors for further pain medicines at home.       Elmer Sow. Pilar Plate, MD [facility] Emergency Medicine [facility] [email or web address]  Final Clinical Impressions(s) / ED Diagnoses     ICD-10-CM   1. Pain of left lower extremity  M79.605       ED Discharge Orders     None        Discharge Instructions Discussed with and Provided to Patient:    Discharge Instructions      You were evaluated in the Emergency Department and after careful evaluation, we did not find any emergent condition requiring admission or further testing in the hospital.  Your exam/testing today was overall reassuring.  Please return to the Emergency Department if you experience any worsening of your condition.  Thank  you for allowing [name] to be a part of your care.        Sabas Sous, MD 09/26/20 [phone]

## 2020-09-26 NOTE — Discharge Instructions (Addendum)
You were evaluated in the Emergency Department and after careful evaluation, we did not find any emergent condition requiring admission or further testing in the hospital.  Your exam/testing today was overall reassuring.  Please return to the Emergency Department if you experience any worsening of your condition.  Thank you for allowing us to be a part of your care.  

## 2020-09-26 NOTE — ED Notes (Signed)
Pt ambulating in hallway well. Stating his leg is feeling better but he would like an oxycodone pill for pain.

## 2020-09-28 ENCOUNTER — Other Ambulatory Visit: Payer: Self-pay | Admitting: Physician Assistant

## 2020-09-29 ENCOUNTER — Telehealth: Payer: Self-pay | Admitting: Licensed Clinical Social Worker

## 2020-09-29 NOTE — Telephone Encounter (Signed)
LCSW received a call from pt stating that he needs assistance with getting additional pain medications. LCSW shared that I am unable to assist with ordering or managing pain medications and inquired if he had called [facility] office. Pt had not yet at that point called [facility] office about medications. I provided him and his sister with office number. Pt also inquiring about status of disability application. Inquired if pt still had lawyer assisting him, he does. Encouraged pt to reach out directly to Disability Determination Services and his lawyer to let them know about his procedure and ongoing care so they can get the appropriate clinicals requested from [facility] Records.    [name], MSW, LCSW [facility] Heart/Vascular Care Navigation  [phone]

## 2020-09-30 ENCOUNTER — Telehealth: Payer: Self-pay | Admitting: *Deleted

## 2020-09-30 NOTE — Telephone Encounter (Signed)
HH nurse called to confirm that we would be changing pts wound vac. She is able to change in on mondays and requested [name] to change it one wed and fri. We have already scheduled the pt for tomorrow (thurs) for a wound vac change. We will schedule his next changes for wed and fridays. [facility] nurse states that is pt is aware of and plans to come to his appt tomorrow.

## 2020-10-01 ENCOUNTER — Inpatient Hospital Stay (HOSPITAL_COMMUNITY)
Admission: EM | Admit: 2020-10-01 | Discharge: 2020-10-16 | DRG: 240 | Disposition: A | Discharge status: Skilled Nursing Facility | Payer: Medicaid Other | Attending: Surgery | Admitting: Surgery

## 2020-10-01 ENCOUNTER — Ambulatory Visit (INDEPENDENT_AMBULATORY_CARE_PROVIDER_SITE_OTHER): Payer: Medicaid Other | Admitting: Physician Assistant

## 2020-10-01 ENCOUNTER — Other Ambulatory Visit: Payer: Self-pay | Admitting: Physician Assistant

## 2020-10-01 ENCOUNTER — Inpatient Hospital Stay (HOSPITAL_COMMUNITY): Payer: Medicaid Other

## 2020-10-01 ENCOUNTER — Other Ambulatory Visit: Payer: Self-pay

## 2020-10-01 VITALS — BP 133/85 | HR 93 | Ht 67.0 in | Wt 143.0 lb

## 2020-10-01 DIAGNOSIS — I739 Peripheral vascular disease, unspecified: Secondary | ICD-10-CM

## 2020-10-01 DIAGNOSIS — Z886 Allergy status to analgesic agent status: Secondary | ICD-10-CM

## 2020-10-01 DIAGNOSIS — S81802S Unspecified open wound, left lower leg, sequela: Secondary | ICD-10-CM

## 2020-10-01 DIAGNOSIS — Z20822 Contact with and (suspected) exposure to covid-19: Secondary | ICD-10-CM | POA: Diagnosis present

## 2020-10-01 DIAGNOSIS — I70303 Unspecified atherosclerosis of unspecified type of bypass graft(s) of the extremities, bilateral legs: Secondary | ICD-10-CM | POA: Diagnosis not present

## 2020-10-01 DIAGNOSIS — I998 Other disorder of circulatory system: Secondary | ICD-10-CM | POA: Diagnosis present

## 2020-10-01 DIAGNOSIS — Z79899 Other long term (current) drug therapy: Secondary | ICD-10-CM

## 2020-10-01 DIAGNOSIS — D62 Acute posthemorrhagic anemia: Secondary | ICD-10-CM | POA: Diagnosis not present

## 2020-10-01 DIAGNOSIS — T827XXA Infection and inflammatory reaction due to other cardiac and vascular devices, implants and grafts, initial encounter: Secondary | ICD-10-CM | POA: Diagnosis not present

## 2020-10-01 DIAGNOSIS — Z7902 Long term (current) use of antithrombotics/antiplatelets: Secondary | ICD-10-CM

## 2020-10-01 DIAGNOSIS — Z823 Family history of stroke: Secondary | ICD-10-CM

## 2020-10-01 DIAGNOSIS — F17210 Nicotine dependence, cigarettes, uncomplicated: Secondary | ICD-10-CM | POA: Diagnosis present

## 2020-10-01 DIAGNOSIS — Z820 Family history of epilepsy and other diseases of the nervous system: Secondary | ICD-10-CM | POA: Diagnosis not present

## 2020-10-01 DIAGNOSIS — I7090 Unspecified atherosclerosis: Secondary | ICD-10-CM | POA: Diagnosis present

## 2020-10-01 DIAGNOSIS — L02214 Cutaneous abscess of groin: Secondary | ICD-10-CM | POA: Diagnosis not present

## 2020-10-01 DIAGNOSIS — E785 Hyperlipidemia, unspecified: Secondary | ICD-10-CM | POA: Diagnosis present

## 2020-10-01 DIAGNOSIS — I70229 Atherosclerosis of native arteries of extremities with rest pain, unspecified extremity: Secondary | ICD-10-CM

## 2020-10-01 DIAGNOSIS — Z8261 Family history of arthritis: Secondary | ICD-10-CM | POA: Diagnosis not present

## 2020-10-01 DIAGNOSIS — R202 Paresthesia of skin: Secondary | ICD-10-CM

## 2020-10-01 DIAGNOSIS — Z91018 Allergy to other foods: Secondary | ICD-10-CM | POA: Diagnosis not present

## 2020-10-01 DIAGNOSIS — I70399 Other atherosclerosis of unspecified type of bypass graft(s) of the extremities, unspecified extremity: Secondary | ICD-10-CM | POA: Diagnosis not present

## 2020-10-01 DIAGNOSIS — I745 Embolism and thrombosis of iliac artery: Secondary | ICD-10-CM | POA: Diagnosis present

## 2020-10-01 DIAGNOSIS — Z825 Family history of asthma and other chronic lower respiratory diseases: Secondary | ICD-10-CM | POA: Diagnosis not present

## 2020-10-01 DIAGNOSIS — G546 Phantom limb syndrome with pain: Secondary | ICD-10-CM | POA: Diagnosis present

## 2020-10-01 DIAGNOSIS — I70222 Atherosclerosis of native arteries of extremities with rest pain, left leg: Secondary | ICD-10-CM | POA: Diagnosis not present

## 2020-10-01 DIAGNOSIS — I10 Essential (primary) hypertension: Secondary | ICD-10-CM | POA: Diagnosis present

## 2020-10-01 DIAGNOSIS — R531 Weakness: Secondary | ICD-10-CM | POA: Diagnosis present

## 2020-10-01 LAB — COMPREHENSIVE METABOLIC PANEL
ALT: 48 U/L — ABNORMAL HIGH (ref 0–44)
AST: 176 U/L — ABNORMAL HIGH (ref 15–41)
Albumin: 2.7 g/dL — ABNORMAL LOW (ref 3.5–5.0)
Alkaline Phosphatase: 130 U/L — ABNORMAL HIGH (ref 38–126)
Anion gap: 8 (ref 5–15)
BUN: 25 mg/dL — ABNORMAL HIGH (ref 8–23)
CO2: 25 mmol/L (ref 22–32)
Calcium: 8.6 mg/dL — ABNORMAL LOW (ref 8.9–10.3)
Chloride: 102 mmol/L (ref 98–111)
Creatinine, Ser: 0.9 mg/dL (ref 0.61–1.24)
GFR, Estimated: >60 mL/min (ref >60)
Glucose, Bld: 122 mg/dL — ABNORMAL HIGH (ref 70–99)
Potassium: 4.8 mmol/L (ref 3.5–5.1)
Sodium: 135 mmol/L (ref 135–145)
Total Bilirubin: 0.4 mg/dL (ref 0.3–1.2)
Total Protein: 6.6 g/dL (ref 6.5–8.1)

## 2020-10-01 LAB — CBC WITH DIFFERENTIAL/PLATELET
Abs Immature Granulocytes: 0.07 10*3/uL (ref 0.00–0.07)
Basophils Absolute: 0 10*3/uL (ref 0.0–0.1)
Basophils Relative: 0 %
Eosinophils Absolute: 0 10*3/uL (ref 0.0–0.5)
Eosinophils Relative: 0 %
HCT: 31.2 % — ABNORMAL LOW (ref 39.0–52.0)
Hemoglobin: 10.3 g/dL — ABNORMAL LOW (ref 13.0–17.0)
Immature Granulocytes: 1 %
Lymphocytes Relative: 5 %
Lymphs Abs: 0.7 10*3/uL (ref 0.7–4.0)
MCH: 23.3 pg — ABNORMAL LOW (ref 26.0–34.0)
MCHC: 33 g/dL (ref 30.0–36.0)
MCV: 70.6 fL — ABNORMAL LOW (ref 80.0–100.0)
Monocytes Absolute: 0.9 10*3/uL (ref 0.1–1.0)
Monocytes Relative: 6 %
Neutro Abs: 12.9 10*3/uL — ABNORMAL HIGH (ref 1.7–7.7)
Neutrophils Relative %: 88 %
Platelets: 608 10*3/uL — ABNORMAL HIGH (ref 150–400)
RBC: 4.42 MIL/uL (ref 4.22–5.81)
RDW: 18.6 % — ABNORMAL HIGH (ref 11.5–15.5)
WBC: 14.6 10*3/uL — ABNORMAL HIGH (ref 4.0–10.5)
nRBC: 0 % (ref 0.0–0.2)

## 2020-10-01 LAB — CBC
HCT: 26 % — ABNORMAL LOW (ref 39.0–52.0)
Hemoglobin: 8.4 g/dL — ABNORMAL LOW (ref 13.0–17.0)
MCH: 22.9 pg — ABNORMAL LOW (ref 26.0–34.0)
MCHC: 32.3 g/dL (ref 30.0–36.0)
MCV: 70.8 fL — ABNORMAL LOW (ref 80.0–100.0)
Platelets: 512 10*3/uL — ABNORMAL HIGH (ref 150–400)
RBC: 3.67 MIL/uL — ABNORMAL LOW (ref 4.22–5.81)
RDW: 18.6 % — ABNORMAL HIGH (ref 11.5–15.5)
WBC: 14.4 10*3/uL — ABNORMAL HIGH (ref 4.0–10.5)
nRBC: 0 % (ref 0.0–0.2)

## 2020-10-01 LAB — PROTIME-INR
INR: 1.1 (ref 0.8–1.2)
Prothrombin Time: 14.3 seconds (ref 11.4–15.2)

## 2020-10-01 LAB — LACTIC ACID, PLASMA: Lactic Acid, Venous: 1.4 mmol/L (ref 0.5–1.9)

## 2020-10-01 MED ORDER — HYDRALAZINE HCL 20 MG/ML IJ SOLN: 5.0000 mg | INTRAMUSCULAR | Status: DC | PRN

## 2020-10-01 MED ORDER — PIPERACILLIN-TAZOBACTAM 3.375 G IVPB 30 MIN
3.3750 g | Freq: Once | INTRAVENOUS | Status: AC
  Administered 2020-10-01: 3.375 g via INTRAVENOUS
  Filled 2020-10-01: qty 50

## 2020-10-01 MED ORDER — VANCOMYCIN HCL 1250 MG/250ML IV SOLN
1250.0000 mg | Freq: Once | INTRAVENOUS | Status: AC
  Administered 2020-10-01: 1250 mg via INTRAVENOUS
  Filled 2020-10-01: qty 250

## 2020-10-01 MED ORDER — ONDANSETRON HCL 4 MG/2ML IJ SOLN: 4.0000 mg | Freq: Four times a day (QID) | INTRAMUSCULAR | Status: DC | PRN

## 2020-10-01 MED ORDER — MORPHINE SULFATE (PF) 2 MG/ML IV SOLN
2.0000 mg | INTRAVENOUS | Status: DC | PRN
  Administered 2020-10-03 – 2020-10-16 (×12): 2 mg via INTRAVENOUS
  Filled 2020-10-01 (×13): qty 1

## 2020-10-01 MED ORDER — METOPROLOL TARTRATE 5 MG/5ML IV SOLN: 2.0000 mg | INTRAVENOUS | Status: DC | PRN

## 2020-10-01 MED ORDER — OXYCODONE-ACETAMINOPHEN 5-325 MG PO TABS
1.0000 | ORAL_TABLET | ORAL | Status: DC | PRN
  Administered 2020-10-01: 1 via ORAL
  Administered 2020-10-02 – 2020-10-10 (×34): 2 via ORAL
  Administered 2020-10-10: 1 via ORAL
  Administered 2020-10-10 – 2020-10-16 (×20): 2 via ORAL
  Filled 2020-10-01 (×27): qty 2
  Filled 2020-10-01: qty 1
  Filled 2020-10-01 (×22): qty 2
  Filled 2020-10-01: qty 1
  Filled 2020-10-01 (×6): qty 2

## 2020-10-01 MED ORDER — PANTOPRAZOLE SODIUM 40 MG PO TBEC
40.0000 mg | DELAYED_RELEASE_TABLET | Freq: Every day | ORAL | Status: DC
  Administered 2020-10-02 – 2020-10-16 (×14): 40 mg via ORAL
  Filled 2020-10-01 (×14): qty 1

## 2020-10-01 MED ORDER — SODIUM CHLORIDE 0.9 % IV SOLN: INTRAVENOUS | Status: DC

## 2020-10-01 MED ORDER — PHENOL 1.4 % MT LIQD: 1.0000 | OROMUCOSAL | Status: DC | PRN

## 2020-10-01 MED ORDER — ACETAMINOPHEN 325 MG PO TABS
325.0000 mg | ORAL_TABLET | ORAL | Status: DC | PRN
  Administered 2020-10-09 – 2020-10-16 (×6): 650 mg via ORAL
  Filled 2020-10-01 (×7): qty 2

## 2020-10-01 MED ORDER — PIPERACILLIN-TAZOBACTAM 3.375 G IVPB: 3.3750 g | Freq: Three times a day (TID) | INTRAVENOUS | Status: DC

## 2020-10-01 MED ORDER — VANCOMYCIN HCL 750 MG/150ML IV SOLN
750.0000 mg | Freq: Two times a day (BID) | INTRAVENOUS | Status: DC
  Administered 2020-10-02 – 2020-10-08 (×13): 750 mg via INTRAVENOUS
  Filled 2020-10-01 (×14): qty 150

## 2020-10-01 MED ORDER — ALUM & MAG HYDROXIDE-SIMETH 200-200-20 MG/5ML PO SUSP: 15.0000 mL | ORAL | Status: DC | PRN

## 2020-10-01 MED ORDER — PIPERACILLIN-TAZOBACTAM 3.375 G IVPB
3.3750 g | Freq: Three times a day (TID) | INTRAVENOUS | Status: DC
  Administered 2020-10-02 – 2020-10-08 (×18): 3.375 g via INTRAVENOUS
  Filled 2020-10-01 (×18): qty 50

## 2020-10-01 MED ORDER — GUAIFENESIN-DM 100-10 MG/5ML PO SYRP: 15.0000 mL | ORAL_SOLUTION | ORAL | Status: DC | PRN

## 2020-10-01 MED ORDER — LABETALOL HCL 5 MG/ML IV SOLN
10.0000 mg | INTRAVENOUS | Status: DC | PRN
  Filled 2020-10-01: qty 4

## 2020-10-01 MED ORDER — IOHEXOL 350 MG/ML SOLN
100.0000 mL | Freq: Once | INTRAVENOUS | Status: AC | PRN
  Administered 2020-10-01: 100 mL via INTRAVENOUS

## 2020-10-01 MED ORDER — ACETAMINOPHEN 325 MG RE SUPP
325.0000 mg | RECTAL | Status: DC | PRN
  Filled 2020-10-01: qty 2

## 2020-10-01 NOTE — Progress Notes (Signed)
POST OPERATIVE OFFICE NOTE    CC:  F/u for surgery  HPI:  This is a 60 y.o. male who is s/p  Incision and drainage of left proximal calf wound with excision of distal femoropopliteal prosthetic bypass graft 09/07/2020.   He presented with reported purulent drainage from his left proximal calf incision on the medial side where the distal target was exposed.  CT showed fluid around the distal graft and the graft was thrombosed. A wound VAC dressing was placed, a PICC was placed and he was discharged on Ancef for six weeks.  He reutrns today for wound VAC change.  He reports a 2-day history of inability to move his left foot with left lower leg rest pain.  He denies fever or chills.  He also states he had a "boil" of the left groin that has drained blood.  His sister has been administering his Ancef daily although he has not had it today.  He has a complex vascular history.  He saw Dr. Trula Slade dating back to 2018 for cluadication.  He eventually underwent left and right CIA stent placement in August of 2021. He then underwent right to left femoral-femoral bypass with a left femoral to below-knee popliteal bypass with vein in 2021 by Dr. Trula Slade.  This ultimately thrombosed and he was taken back in March 2022 for aortobifemoral bypass and a redo left femoropopliteal bypass with prosthetic also by Dr. Trula Slade.  He presented in mid July with reported purulent drainage from his left proximal calf incision on the medial side where the distal target was exposed.  CT showed fluid around the distal graft and the graft was thrombosed.     Allergies  Allergen Reactions   Aspirin Nausea And Vomiting   Garlic Other (See Comments)    sneezing    Current Outpatient Medications  Medication Sig Dispense Refill   acetaminophen (TYLENOL) 500 MG tablet Take 2 tablets (1,000 mg total) by mouth every 6 (six) hours as needed for mild pain or headache. (Patient taking differently: Take 500 mg by mouth 3 (three) times  daily.) 100 tablet 0   atorvastatin (LIPITOR) 20 MG tablet TAKE 1 TABLET(20 MG) BY MOUTH DAILY 30 tablet 3   atorvastatin (LIPITOR) 40 MG tablet Take 1 tablet (40 mg total) by mouth daily. 90 tablet 12   buPROPion (WELLBUTRIN XL) 150 MG 24 hr tablet TAKE 1 TABLET (150 MG TOTAL) BY MOUTH EVERY MORNING. 90 tablet 1   ceFAZolin (ANCEF) IVPB Inject 2 g into the vein every 8 (eight) hours. Indication:  Bypass graft infection First Dose: No Last Day of Therapy: 10/17/20 Labs - Once weekly:  CBC/D and BMP, Labs - Every other week:  ESR and CRP Method of administration: IV Push Method of administration may be changed at the discretion of home infusion pharmacist based upon assessment of the patient and/or caregiver's ability to self-administer the medication ordered. 126 Units 0   clopidogrel (PLAVIX) 75 MG tablet Take 1 tablet (75 mg total) by mouth daily. 90 tablet 3   gabapentin (NEURONTIN) 100 MG capsule TAKE 1 CAPSULE (100 MG) IN THE MORNING AND 2 CAPSULE (200 MG) AT BEDTIME 90 capsule 2   Homeopathic Products (LEG CRAMPS PM SL) Place 2 tablets under the tongue at bedtime.     lactose free nutrition (BOOST) LIQD Take 237 mLs by mouth daily.     OVER THE COUNTER MEDICATION Take 1 capsule by mouth daily. Stacker energy pill     Vitamin D, Ergocalciferol, (DRISDOL)  1.25 MG (50000 UNIT) CAPS capsule Take 1 capsule (50,000 Units total) by mouth 2 (two) times a week. (Patient not taking: Reported on 10/01/2020) 24 capsule 0   No current facility-administered medications for this visit.     ROS:  See HPI  BP 133/85 (BP Location: Left Arm)   Pulse 93   Ht 5' 7"  (1.702 m)   Wt 143 lb (64.9 kg)   SpO2 95%   BMI 22.40 kg/m   Physical Exam: General appearance: Awake, alert in no apparent distress Cardiac: Heart rate and rhythm are regular Respirations: Nonlabored Left lower extremity: He has significant edema on the left lower leg and it is cool to touch to mid lower leg.  He is unable to move his  toes and I am unable to obtain any Doppler signals.  I removed his wound VAC sponge.  No drainage or bleeding. Left groin: Positive Doppler signal of left femoral artery.  There is a small amount of cloudy bloody drainage necessitating through a punctate skin opening.  Mildly tender to palpation.  Assessment/Plan:  This is a 60 y.o. male who presents with acutely ischemic left lower leg and purulent drainage from left groin.  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  I discussed the findings with Dr. Oneida Alar who examined the patient.  It was explained to the patient this is a life threatening situation and he will require left lower extremity amputation.  Admission for IV fluids, broad-spectrum antibiotics, stat CTA of abdomen pelvis with bilateral runoff.  The case was also discussed with Dr. Carlis Abbott who is on-call this evening.   Risa Grill, PA-C [facility] [phone]  Clinic MD:  Oneida Alar

## 2020-10-01 NOTE — ED Triage Notes (Signed)
Pt BIB [facility] d/t pt unable to feel/ move his Left foot. Pt reports he had a stent placed in 2018 that was inserted through his Left groin. 3 days ago he began to feel numb and "tingly" to the bottom of his left foot. Yesterday he noticed it becoming it discolored & the "feeling was gone and felt asleep." He then stated his Left groin began to bleed. He went to see Dr. Chestine Spore today & the stent was removed before he was brought here to ED. Upon arrival pt's Left foot up to his knee feels cold, he can not move it & no pulse can be found with a doppler.

## 2020-10-01 NOTE — Progress Notes (Signed)
Pharmacy Antibiotic Note  Benjamin [name] is a 60 y.o. male admitted on 10/01/2020 with cellulitis. Patient has a history of HTN, HLD, & PAD. Patient presented to vascular and vein specialist today for ischemic left leg with purulent drainage from the left groin.  History is notable for bilateral iliac artery stents on 10/15/2019. Patient later developed left leg ulcer and requiring bypass on 11/13/2019, which was complicated by re-thrombosis requiring further bypass on 05/08/2020.   Recent admission on 09/05/2020 secondary to occlusion of left femoropopliteal bypass graft with purulent drainage. Patient underwent I&D in the OR with excision of the distal graft and was discharged with a VAC and PICC and 6 weeks IV cefazolin.    Pharmacy has been consulted for vancomycin dosing. Patient is receiving zosyn currently as well.  SCr is 0.9 which is at baseline for the patient. WBC 14.6. 7/18 culture with MSSA.  Plan: Zosyn 3.375g q8h Vancomycin 1250 mg once then 750 mg q12hr unless change in renal function - eAUC 438.2 Monitor renal function and adjust dosing as indicated F/u cultures and vascular recs De-escalate antibiotics as appropriate  Height: 5\' 7"  (170.2 cm) Weight: 66.7 kg (147 lb) IBW/kg (Calculated) : 66.1  Temp (24hrs), Avg:99.1 F (37.3 C), Min:99.1 F (37.3 C), Max:99.1 F (37.3 C)  Recent Labs  Lab 10/01/20 1803 10/01/20 1848  WBC 14.6*  --   CREATININE 0.90  --   LATICACIDVEN  --  1.4    Estimated Creatinine Clearance: 80.6 mL/min (by C-G formula based on SCr of 0.9 mg/dL).    Allergies  Allergen Reactions   Aspirin Nausea And Vomiting   Garlic Other (See Comments)    sneezing   Antimicrobials this admission: Vancomycin 8/11 >>  Zosyn 8/11 >>   Microbiology results: Pending  Thank you for allowing pharmacy to be a part of this patient's care.  10/11, PharmD, BCPS 10/01/2020 10:28 PM ED Clinical Pharmacist -  [phone]

## 2020-10-01 NOTE — ED Notes (Signed)
Dr Clark at bedside

## 2020-10-01 NOTE — ED Provider Notes (Signed)
[facility] EMERGENCY DEPARTMENT Provider Note   CSN: [id number] Arrival date & time: 10/01/20  1751     History Chief Complaint  Patient presents with   Cold Left leg   Circulation Problem    Benjamin [name] is a 60 y.o. male.  HPI  This patient is a 60 year old male, he has a history of being a heavy smoker, hypertension hyperlipidemia and he has had significant peripheral arterial disease that has resulted in bilateral femoral artery disease and bypasses of his lower extremities.  He presents to the hospital from the vascular clinic with a cold leg that is weak and numb.  Symptoms started yesterday, got worse this morning, not associated with fevers or difficulty breathing, arrives by paramedic transport with a note of a cold leg without pulses.  Vascular to be paged on arrival.  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) 11/13/2019   Callus of foot 07/01/2019   Intermittent claudication ([place]) 01/20/2017   Need for influenza vaccination 01/20/2017   Needs smoking cessation education 09/06/2016   Abnormal echocardiogram 09/05/2016   Abnormal EKG 07/25/2016   Preoperative cardiovascular examination 07/25/2016   Shortness of breath on exertion 07/25/2016   Dyslipidemia, goal LDL below 70 07/25/2016   Uncontrolled hypertension 07/25/2016   Abnormal chest x-ray 07/04/2016   Peripheral vascular disease ([place]) 07/04/2016   Decreased pedal pulses 06/24/2016   Varicose vein of leg 06/24/2016   Heavy smoker  06/24/2016   Abnormal weight loss 06/24/2016   Foot pain, left 06/24/2016   Screening for prostate cancer 06/24/2016    Past Surgical History:  Procedure Laterality Date   ABDOMINAL AORTOGRAM W/LOWER EXTREMITY N/A 07/12/2016   Procedure: Abdominal Aortogram w/Lower Extremity;  Surgeon: Serafina Mitchell, MD;  Location: [place] [facility];  Service: Cardiovascular;  Laterality: N/A;  Bilateral extermity: Patent Renal As. No sig Dz in infrarenal Abd Aorta. Normal Bilat Iliac arteries. R SFA is 100% @ origin - recon in AK-Pop A. R PT A patent. L CFA occluded. L PFA recon @ origin, L SFA occluded w/ recon in AK Pop A.   ABDOMINAL AORTOGRAM W/LOWER EXTREMITY N/A 10/15/2019   Procedure: ABDOMINAL AORTOGRAM W/LOWER EXTREMITY;  Surgeon: Serafina Mitchell, MD;  Location: [place] [facility];  Service: Cardiovascular;  Laterality: N/A;   AORTA - BILATERAL FEMORAL ARTERY BYPASS GRAFT N/A 05/08/2020   Procedure: AORTA BIFEMORAL BYPASS GRAFT;  Surgeon: Serafina Mitchell, MD;  Location: [place] OR;  Service: Vascular;  Laterality: N/A;   COLONOSCOPY  06/2016   never   ENDARTERECTOMY FEMORAL Left 11/14/2019   Procedure: Left groin exploration, Redo left femoral artery exposure;  Surgeon: Serafina Mitchell, MD;  [facility];  Service: Vascular;  Laterality: Left;   FEMORAL-FEMORAL BYPASS GRAFT N/A 11/13/2019   Procedure: BYPASS GRAFT FEMORAL-FEMORAL ARTERY RIGHT TO LEFT USING HEMASHIELD GOLD GRAFT 33m x 30cm;  Surgeon: BSerafina Mitchell MD;  [facility];  Service: Vascular;  Laterality: N/A;   FEMORAL-POPLITEAL BYPASS GRAFT Left 11/13/2019  Procedure: LEFT FEMORAL BELOW KNEE-POPLITEAL ARTERY USING NON-REVERSED GREATER SAPHENOUS VEIN;  Surgeon: Serafina Mitchell, MD;  [facility];  Service: Vascular;  Laterality: Left;   FEMORAL-POPLITEAL BYPASS GRAFT Left 05/08/2020   Procedure: LEFT REDO BYPASS GRAFT COMMON FEMORAL- BELOW KNEE POPLITEAL ARTERY USING PROPATEN GRAFT;  Surgeon: Serafina Mitchell, MD;  [facility];  Service: Vascular;  Laterality: Left;   I & D EXTREMITY Left 09/07/2020   Procedure: IRRIGATION AND DEBRIDEMENT LEFT LEG WITH EXCISION OF LEFT LEG DISTAL BYPASS GRAFT;  Surgeon: Marty Heck, MD;  Location: [place];  Service: Vascular;  Laterality: Left;   LOWER EXTREMITY ANGIOGRAM Left 11/14/2019   Procedure: LEFT LOWER EXTREMITY ANGIOGRAM, BYPASS GRAFT ANGIOPLASTY;  Surgeon: Serafina Mitchell, MD;  [facility];  Service: Vascular;  Laterality: Left;   NO PAST SURGERIES  06/2016   PERIPHERAL VASCULAR INTERVENTION  10/15/2019   Procedure: PERIPHERAL VASCULAR INTERVENTION;  Surgeon: Serafina Mitchell, MD;  Location: [place] [facility];  Service: Cardiovascular;;  Rt Iliac       Family History  Problem Relation Age of Onset   Arthritis Mother    COPD Father    Stroke Father    Multiple sclerosis Sister     Social History   Tobacco Use   Smoking status: Every Day    Packs/day: 1.50    Years: 29.00    Pack years: 43.50    Types: Cigarettes   Smokeless tobacco: Never   Tobacco comments:    he is down to 1 PPD - 07/22/2020  Vaping Use   Vaping Use: Never used  Substance Use Topics   Alcohol use: No   Drug use: No    Home Medications Prior to Admission medications   Medication Sig Start Date End Date Taking? Authorizing Provider  acetaminophen (TYLENOL) 500 MG tablet Take 2 tablets (1,000 mg total) by mouth every 6 (six) hours as needed for mild pain or headache. Patient taking differently: Take 500 mg by mouth 3 (three) times daily. 05/15/20   Dagoberto Ligas, PA-C  atorvastatin (LIPITOR) 20 MG tablet TAKE 1 TABLET(20 MG) BY MOUTH DAILY 09/28/20   Setzer, Edman Circle, PA-C  atorvastatin (LIPITOR) 40 MG tablet Take 1 tablet (40 mg total) by mouth daily. 09/17/20   Setzer, Edman Circle, PA-C  buPROPion (WELLBUTRIN XL) 150 MG 24 hr tablet TAKE 1 TABLET (150 MG TOTAL) BY MOUTH EVERY MORNING. 07/14/20 07/14/21  Dagoberto Ligas, PA-C  ceFAZolin (ANCEF) IVPB Inject 2 g into the vein  every 8 (eight) hours. Indication:  Bypass graft infection First Dose: No Last Day of Therapy: 10/17/20 Labs - Once weekly:  CBC/D and BMP, Labs - Every other week:  ESR and CRP Method of administration: IV Push Method of administration may be changed at the discretion of home infusion pharmacist based upon assessment of the patient and/or caregiver's ability to self-administer the medication ordered. 09/11/20   Setzer, Edman Circle, PA-C  clopidogrel (PLAVIX) 75 MG tablet Take 1 tablet (75 mg total) by mouth daily. 09/17/20   Setzer, Edman Circle, PA-C  gabapentin (NEURONTIN) 100 MG capsule TAKE 1 CAPSULE (100 MG) IN THE MORNING AND 2 CAPSULE (200 MG) AT BEDTIME 07/14/20 07/14/21  Dagoberto Ligas, PA-C  Homeopathic Products (LEG CRAMPS PM SL) Place 2 tablets under the tongue at bedtime.    [provider]  lactose free nutrition (BOOST) LIQD Take 237 mLs by mouth daily.    [provider]  OVER THE COUNTER MEDICATION Take  1 capsule by mouth daily. Stacker energy pill    [provider]  Vitamin D, Ergocalciferol, (DRISDOL) 1.25 MG (50000 UNIT) CAPS capsule Take 1 capsule (50,000 Units total) by mouth 2 (two) times a week. Patient not taking: Reported on 10/01/2020 08/17/20   [name], FNP    Allergies    Aspirin and Garlic  Review of Systems   Review of Systems  All other systems reviewed and are negative.  Physical Exam Updated Vital Signs There were no vitals taken for this visit.  Physical Exam Vitals and nursing note reviewed.  Constitutional:      General: He is not in acute distress.    Appearance: He is well-developed.  HENT:     Head: Normocephalic and atraumatic.     Mouth/Throat:     Pharynx: No oropharyngeal exudate.  Eyes:     General: No scleral icterus.       Right eye: No discharge.        Left eye: No discharge.     Conjunctiva/sclera: Conjunctivae normal.     Pupils: Pupils are equal, round, and reactive to light.  Neck:     Thyroid: No  thyromegaly.     Vascular: No JVD.  Cardiovascular:     Rate and Rhythm: Normal rate and regular rhythm.     Heart sounds: Normal heart sounds. No murmur heard.   No friction rub. No gallop.  Pulmonary:     Effort: Pulmonary effort is normal. No respiratory distress.     Breath sounds: Normal breath sounds. No wheezing or rales.  Abdominal:     General: Bowel sounds are normal. There is no distension.     Palpations: Abdomen is soft. There is no mass.     Tenderness: There is no abdominal tenderness.  Musculoskeletal:        General: No tenderness. Normal range of motion.     Cervical back: Normal range of motion and neck supple.     Left lower leg: Edema present.     Comments: Left lower extremity has no pulses, it is very cold to the touch, is extremely edematous, there is some desquamation of the toes  Lymphadenopathy:     Cervical: No cervical adenopathy.  Skin:    General: Skin is warm and dry.     Findings: No erythema or rash.  Neurological:     Mental Status: He is alert.     Coordination: Coordination normal.     Comments: Numbness of the left leg from the knee down  Psychiatric:        Behavior: Behavior normal.    ED Results / Procedures / Treatments   Labs (all labs ordered are listed, but only abnormal results are displayed) Labs Reviewed  RESP PANEL BY RT-PCR (FLU A&B, COVID) ARPGX2  CBC WITH DIFFERENTIAL/PLATELET  COMPREHENSIVE METABOLIC PANEL  LACTIC ACID, PLASMA    EKG None  Radiology No results found.  Procedures Procedures   Medications Ordered in ED Medications  vancomycin (VANCOCIN) IVPB 1000 mg/200 mL premix (has no administration in time range)  piperacillin-tazobactam (ZOSYN) IVPB 3.375 g (has no administration in time range)    ED Course  I have reviewed the triage vital signs and the nursing notes.  Pertinent labs & imaging results that were available during my care of the patient were reviewed by me and considered in my medical  decision making (see chart for details).    MDM Rules/Calculators/A&P  This patient is ill-appearing, he has an acute vascular occlusion to his left lower extremity coming from the vascular clinic for admission.  IV will be placed, vascular paged, labs drawn, COVID tested, patient will need admission to the hospital with special stabilizing care.  I discussed this patient's care with Dr. Fortunato Curling who will have the physician assistant come see the patient, he requested a CT angiogram aortic and bifemoral runoff, labs, COVID, antibiotics, bed request placed at his request to 4 E.   Final Clinical Impression(s) / ED Diagnoses Final diagnoses:  Arterial occlusion    Rx / DC Orders ED Discharge Orders     None        Noemi Chapel, MD 10/01/20 1810

## 2020-10-01 NOTE — ED Notes (Signed)
O2 88% on RA while sleeping, pt placed on 2LPM via [place] O2 increased to 99%

## 2020-10-01 NOTE — H&P (Signed)
H&P     History of Present Illness: This is a 60 y.o. male with history of hypertension, hyperlipidemia, peripheral arterial disease that presents from our office for admission due to profoundly ischemic left leg with purulent drainage from the left groin.    He has a complex history including bilateral iliac artery stents by Dr. Trula Slade 10/15/2019.  He later developed a left leg ulcer and underwent a right to left femorofemoral bypass with a left femoral to below-knee pop bypass with vein on 11/13/2019 by Dr. Trula Slade.  This ultimately thrombosed and he underwent an aortobifemoral bypass with a redo left femoral to below-knee popliteal bypass with PTFE on 05/08/2020 this year with Dr. Trula Slade.  Most recently was admitted on 09/05/2020 with a silent occlusion of his left femoropopliteal bypass graft and also purulent drainage from the below-knee popliteal incision with fluid around the graft.  He underwent I&D in the OR with excision of the distal graft.  Ultimately he was discharged with a VAC and PICC and 6 weeks IV antibiotics.  He has been seen in the office multiple times and then presented today with weakness and numbness in the left leg that has been ongoing for several days.  He has no motor or sensation in the left foot.  Past Medical History:  Diagnosis Date   Back pain    Heavy smoker    Hyperlipidemia LDL goal <70    Hypertension    Peripheral arterial occlusive disease ([place]) 06/2013   Bilateral femoral artery disease    Past Surgical History:  Procedure Laterality Date   ABDOMINAL AORTOGRAM W/LOWER EXTREMITY N/A 07/12/2016   Procedure: Abdominal Aortogram w/Lower Extremity;  Surgeon: Serafina Mitchell, MD;  Location: [place] [facility];  Service: Cardiovascular;  Laterality: N/A;  Bilateral extermity: Patent Renal As. No sig Dz in infrarenal Abd Aorta. Normal Bilat Iliac arteries. R SFA is 100% @ origin - recon in AK-Pop A. R PT A patent. L CFA occluded. L PFA recon @ origin, L SFA  occluded w/ recon in AK Pop A.   ABDOMINAL AORTOGRAM W/LOWER EXTREMITY N/A 10/15/2019   Procedure: ABDOMINAL AORTOGRAM W/LOWER EXTREMITY;  Surgeon: Serafina Mitchell, MD;  Location: [place] [facility];  Service: Cardiovascular;  Laterality: N/A;   AORTA - BILATERAL FEMORAL ARTERY BYPASS GRAFT N/A 05/08/2020   Procedure: AORTA BIFEMORAL BYPASS GRAFT;  Surgeon: Serafina Mitchell, MD;  Location: [place] OR;  Service: Vascular;  Laterality: N/A;   COLONOSCOPY  06/2016   never   ENDARTERECTOMY FEMORAL Left 11/14/2019   Procedure: Left groin exploration, Redo left femoral artery exposure;  Surgeon: Serafina Mitchell, MD;  [facility];  Service: Vascular;  Laterality: Left;   FEMORAL-FEMORAL BYPASS GRAFT N/A 11/13/2019   Procedure: BYPASS GRAFT FEMORAL-FEMORAL ARTERY RIGHT TO LEFT USING HEMASHIELD GOLD GRAFT 68m x 30cm;  Surgeon: BSerafina Mitchell MD;  [facility];  Service: Vascular;  Laterality: N/A;   FEMORAL-POPLITEAL BYPASS GRAFT Left 11/13/2019   Procedure: LEFT FEMORAL BELOW KNEE-POPLITEAL ARTERY USING NON-REVERSED GREATER SAPHENOUS VEIN;  Surgeon: BSerafina Mitchell MD;  [facility];  Service: Vascular;  Laterality: Left;   FEMORAL-POPLITEAL BYPASS GRAFT Left 05/08/2020   Procedure: LEFT REDO BYPASS GRAFT COMMON FEMORAL- BELOW KNEE POPLITEAL ARTERY USING PROPATEN GRAFT;  Surgeon: BSerafina Mitchell MD;  [facility];  Service: Vascular;  Laterality: Left;   I & D EXTREMITY Left 09/07/2020   Procedure: IRRIGATION AND DEBRIDEMENT LEFT LEG WITH EXCISION OF LEFT LEG DISTAL BYPASS GRAFT;  Surgeon: Marty Heck, MD;  Location: [facility];  Service: Vascular;  Laterality: Left;   LOWER EXTREMITY ANGIOGRAM Left 11/14/2019   Procedure: LEFT LOWER EXTREMITY ANGIOGRAM, BYPASS GRAFT ANGIOPLASTY;  Surgeon: Serafina Mitchell, MD;  [facility];  Service: Vascular;  Laterality: Left;   NO PAST SURGERIES  06/2016   PERIPHERAL VASCULAR INTERVENTION  10/15/2019   Procedure: PERIPHERAL VASCULAR INTERVENTION;   Surgeon: Serafina Mitchell, MD;  Location: [place] [facility];  Service: Cardiovascular;;  Rt Iliac    Allergies  Allergen Reactions   Aspirin Nausea And Vomiting   Garlic Other (See Comments)    sneezing    Prior to Admission medications   Medication Sig Start Date End Date Taking? Authorizing Provider  acetaminophen (TYLENOL) 500 MG tablet Take 2 tablets (1,000 mg total) by mouth every 6 (six) hours as needed for mild pain or headache. Patient taking differently: Take 500 mg by mouth 3 (three) times daily. 05/15/20   Dagoberto Ligas, PA-C  atorvastatin (LIPITOR) 20 MG tablet TAKE 1 TABLET(20 MG) BY MOUTH DAILY 09/28/20   Setzer, Edman Circle, PA-C  atorvastatin (LIPITOR) 40 MG tablet Take 1 tablet (40 mg total) by mouth daily. 09/17/20   Setzer, Edman Circle, PA-C  buPROPion (WELLBUTRIN XL) 150 MG 24 hr tablet TAKE 1 TABLET (150 MG TOTAL) BY MOUTH EVERY MORNING. 07/14/20 07/14/21  Dagoberto Ligas, PA-C  ceFAZolin (ANCEF) IVPB Inject 2 g into the vein every 8 (eight) hours. Indication:  Bypass graft infection First Dose: No Last Day of Therapy: 10/17/20 Labs - Once weekly:  CBC/D and BMP, Labs - Every other week:  ESR and CRP Method of administration: IV Push Method of administration may be changed at the discretion of home infusion pharmacist based upon assessment of the patient and/or caregiver's ability to self-administer the medication ordered. 09/11/20   Setzer, Edman Circle, PA-C  clopidogrel (PLAVIX) 75 MG tablet Take 1 tablet (75 mg total) by mouth daily. 09/17/20   Setzer, Edman Circle, PA-C  gabapentin (NEURONTIN) 100 MG capsule TAKE 1 CAPSULE (100 MG) IN THE MORNING AND 2 CAPSULE (200 MG) AT BEDTIME 07/14/20 07/14/21  Dagoberto Ligas, PA-C  Homeopathic Products (LEG CRAMPS PM SL) Place 2 tablets under the tongue at bedtime.    [provider]  lactose free nutrition (BOOST) LIQD Take 237 mLs by mouth daily.    [provider]  OVER THE COUNTER MEDICATION Take 1 capsule by mouth  daily. Stacker energy pill    [provider]  Vitamin D, Ergocalciferol, (DRISDOL) 1.25 MG (50000 UNIT) CAPS capsule Take 1 capsule (50,000 Units total) by mouth 2 (two) times a week. Patient not taking: Reported on 10/01/2020 08/17/20   [name], FNP    Social History   Socioeconomic History   Marital status: Single    Spouse name: Not on file   Number of children: Not on file   Years of education: 12   Highest education level: Not on file  Occupational History   Not on file  Tobacco Use   Smoking status: Every Day    Packs/day: 1.50    Years: 29.00    Pack years: 43.50    Types: Cigarettes   Smokeless tobacco: Never   Tobacco comments:    he is down to 1 PPD - 07/22/2020  Vaping Use   Vaping Use: Never used  Substance and Sexual Activity   Alcohol use: No   Drug use: No   Sexual activity: Not Currently  Other  Topics Concern   Not on file  Social History Narrative   He works as a Secondary school teacher at [employer].   He lives with his sister and her son.   Walks every day to work at least 15+ minutes.   Social Determinants of Health   Financial Resource Strain: High Risk   Difficulty of Paying Living Expenses: Hard  Food Insecurity: No Food Insecurity   Worried About Charity fundraiser in the Last Year: Never true   Ran Out of Food in the Last Year: Never true  Transportation Needs: No Transportation Needs   Lack of Transportation (Medical): No   Lack of Transportation (Non-Medical): No  Physical Activity: Not on file  Stress: Not on file  Social Connections: Not on file  Intimate Partner Violence: Not on file     Family History  Problem Relation Age of Onset   Arthritis Mother    COPD Father    Stroke Father    Multiple sclerosis Sister     ROS: [x]  Positive   [ ]  Negative   [ ]  All sytems reviewed and are negative  Cardiovascular: []  chest pain/pressure []  palpitations []  SOB lying flat []  DOE []  pain in legs while walking []  pain in  legs at rest []  pain in legs at night []  non-healing ulcers []  hx of DVT []  swelling in legs  Pulmonary: []  productive cough []  asthma/wheezing []  home O2  Neurologic: [x]  weakness in []  arms [x]  legs - left leg [x]  numbness in []  arms [x]  legs - left leg []  hx of CVA []  mini stroke [] difficulty speaking or slurred speech []  temporary loss of vision in one eye []  dizziness  Hematologic: []  hx of cancer []  bleeding problems []  problems with blood clotting easily  Endocrine:   []  diabetes []  thyroid disease  GI []  vomiting blood []  blood in stool  GU: []  CKD/renal failure []  HD--[]  M/W/F or []  T/T/S []  burning with urination []  blood in urine  Psychiatric: []  anxiety []  depression  Musculoskeletal: []  arthritis []  joint pain  Integumentary: []  rashes []  ulcers  Constitutional: []  fever []  chills   Physical Examination  There were no vitals filed for this visit. Body mass index is 23.02 kg/m.  General:  NAD Gait: Not observed HENT: WNL, normocephalic Pulmonary: normal non-labored breathing, without Rales, rhonchi,  wheezing Cardiac: regular, without  Murmurs, rubs or gallops Abdomen: soft, NT/ND Vascular Exam/Pulses: Right femoral pulse 2+ palpable No palpable left femoral pulse Draining sinus in left groin just below inguinal crease with purulent drainage Left leg profoundly mottled with no sensation or motor below the knee      CBC    Component Value Date/Time   WBC 6.5 09/07/2020 0454   RBC 4.17 (L) 09/07/2020 0454   HGB 9.9 (L) 09/07/2020 0454   HGB 12.2 (L) 07/22/2020 1150   HCT 29.0 (L) 09/07/2020 0454   HCT 37.4 (L) 07/22/2020 1150   PLT 437 (H) 09/07/2020 0454   PLT 419 07/22/2020 1150   MCV 69.5 (L) 09/07/2020 0454   MCV 76 (L) 07/22/2020 1150   MCH 23.7 (L) 09/07/2020 0454   MCHC 34.1 09/07/2020 0454   RDW 16.5 (H) 09/07/2020 0454   RDW 15.8 (H) 07/22/2020 1150   LYMPHSABS 1.5 09/05/2020 1641   MONOABS 0.4 09/05/2020  1641   EOSABS 0.1 09/05/2020 1641   BASOSABS 0.0 09/05/2020 1641    BMET    Component Value Date/Time   NA 135 09/11/2020  0172   NA 140 07/22/2020 1150   K 3.8 09/11/2020 0627   CL 102 09/11/2020 0627   CO2 24 09/11/2020 0627   GLUCOSE 104 (H) 09/11/2020 0627   BUN 8 09/11/2020 0627   BUN 14 07/22/2020 1150   CREATININE 0.76 09/11/2020 0627   CREATININE 1.02 01/20/2017 0908   CALCIUM 8.8 (L) 09/11/2020 0627   GFRNONAA >60 09/11/2020 0627   GFRAA >60 11/14/2019 0508    COAGS: Lab Results  Component Value Date   INR 1.2 05/08/2020   INR 1.1 05/08/2020   INR 1.1 11/13/2019     Non-Invasive Vascular Imaging:    CTA pending   ASSESSMENT/PLAN: This is a 60 y.o. male that presents from our office with profoundly ischemic left leg in the setting of very complex vascular history as noted above.  Most recently had aortobifemoral bypass with a redo left femoral to below-knee popliteal bypass with PTFE by Dr. Trula Slade on 05/08/2020.  He was seen last month with a silent occlusion of his left leg bypass graft that was also infected and underwent distal excision of the graft with I&D of his popliteal space.  His left leg is profoundly ischemic and he has no motor or sensation below the knee.  He has no left femoral pulse and I suspect the left limb of his aortobifem is occluded.  He also has purulent drainage from the left groin and I suspect he has infected his aortobifem bypass graft.  We will plan for admission.  Broad-spectrum IV antibiotics with vanc  Zosyn.  Baseline labs.  CTA abdomen pelvis with bilateral runoff.  We will have to evaluate imaging to see what options exist for revascularization but will require left above-knee amputation. Not toxic at this time.  Discussed with him that this is a very complex problem and will be life-threatening.  Marty Heck, MD [facility] Office: [place]

## 2020-10-02 ENCOUNTER — Encounter (HOSPITAL_COMMUNITY): Payer: Self-pay | Admitting: Surgery

## 2020-10-02 LAB — COMPREHENSIVE METABOLIC PANEL
ALT: 45 U/L — ABNORMAL HIGH (ref 0–44)
AST: 163 U/L — ABNORMAL HIGH (ref 15–41)
Albumin: 2.3 g/dL — ABNORMAL LOW (ref 3.5–5.0)
Alkaline Phosphatase: 124 U/L (ref 38–126)
Anion gap: 10 (ref 5–15)
BUN: 19 mg/dL (ref 8–23)
CO2: 19 mmol/L — ABNORMAL LOW (ref 22–32)
Calcium: 8.2 mg/dL — ABNORMAL LOW (ref 8.9–10.3)
Chloride: 105 mmol/L (ref 98–111)
Creatinine, Ser: 0.76 mg/dL (ref 0.61–1.24)
GFR, Estimated: >60 mL/min (ref >60)
Glucose, Bld: 133 mg/dL — ABNORMAL HIGH (ref 70–99)
Potassium: 3.8 mmol/L (ref 3.5–5.1)
Sodium: 134 mmol/L — ABNORMAL LOW (ref 135–145)
Total Bilirubin: 0.3 mg/dL (ref 0.3–1.2)
Total Protein: 5.8 g/dL — ABNORMAL LOW (ref 6.5–8.1)

## 2020-10-02 LAB — RESP PANEL BY RT-PCR (FLU A&B, COVID) ARPGX2
Influenza A by PCR: NEGATIVE
Influenza B by PCR: NEGATIVE
SARS Coronavirus 2 by RT PCR: NEGATIVE

## 2020-10-02 MED ORDER — CHLORHEXIDINE GLUCONATE CLOTH 2 % EX PADS
6.0000 | MEDICATED_PAD | Freq: Every day | CUTANEOUS | Status: DC
  Administered 2020-10-02 – 2020-10-16 (×15): 6 via TOPICAL

## 2020-10-02 MED ORDER — SODIUM CHLORIDE 0.9 % IV SOLN: INTRAVENOUS | Status: DC

## 2020-10-02 NOTE — Progress Notes (Signed)
Contact and isolation precautions discontinued per Dr. Myra Gianotti as patient respiratory screen is negative. Brynda Rim, RN

## 2020-10-02 NOTE — Progress Notes (Signed)
Pt transferred from ED to 4E14, He is alert, fully oriented, no chills, afebrile, hemodynamically stable, NSR on monitor. Left DP and PT pulses are absent. Left groin is continuing to have minimal blood drainage. MD aware.  Dry dressing applied.   CHG bath is given, call bell within reach, CCMD called and 2nd person verified. We will continue to monitor.  Filiberto Pinks, RN

## 2020-10-02 NOTE — Progress Notes (Signed)
Subjective  -   No complaints this morning   Physical Exam:  Left leg below the knee is a sensate and has no motor function and is cold.  He has a sinus tract in his left groin.  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       Assessment/Plan:    I discussed with the patient that he will need a left above-knee amputation as his leg is not viable.  I will also plan on removing the infected femoral-popliteal graft.  He potentially will need some form of blood flow to his left thigh to heal a amputation and so I will consider doing a thrombectomy of his left limb of his aortic graft at the time of femoral-popliteal bypass graft removal.  I discussed this with the patient this morning and he appeared to understand however later he wanted to discuss this further with me.  When I returned this evening, we again had an extensive discussion regarding the need for amputation.  I do not think he completely grasps the situation.  He wants to see if the leg wound will heal.  I told him that there is no way his leg is salvageable.  It is clearly dead and has no chance for salvage.  At this time he is not yet ready to proceed with amputation.  Therefore I will cancel his surgery for tomorrow and have further discussions with him regarding the need to get this done.  I will try to get [facility] to come talk to him about living with a prosthesis  Wells Coumba Kellison 10/02/2020 9:58 PM --  Vitals:   10/02/20 0809 10/02/20 2000  BP: 127/75 127/79  Pulse: 87 92  Resp: 18 20  Temp: 97.8 F (36.6 C) 97.8 F (36.6 C)  SpO2: 95% 94%    Intake/Output Summary (Last 24 hours) at 10/02/2020 2158 Last data filed at 10/02/2020 1800 Gross per 24 hour  Intake 3068.52 ml  Output 1400 ml  Net 1668.52 ml     Laboratory CBC    Component Value Date/Time   WBC 14.4 (H) 10/01/2020 2148   HGB 8.4 (L) 10/01/2020 2148    HGB 12.2 (L) 07/22/2020 1150   HCT 26.0 (L) 10/01/2020 2148   HCT 37.4 (L) 07/22/2020 1150   PLT 512 (H) 10/01/2020 2148   PLT 419 07/22/2020 1150    BMET    Component Value Date/Time   NA 134 (L) 10/01/2020 2148   NA 140 07/22/2020 1150   K 3.8 10/01/2020 2148   CL 105 10/01/2020 2148   CO2 19 (L) 10/01/2020 2148   GLUCOSE 133 (H) 10/01/2020 2148   BUN 19 10/01/2020 2148   BUN 14 07/22/2020 1150   CREATININE 0.76 10/01/2020 2148   CREATININE 1.02 01/20/2017 0908   CALCIUM 8.2 (L) 10/01/2020 2148   GFRNONAA >60 10/01/2020 2148   GFRAA >60 11/14/2019 0508    COAG Lab Results  Component Value Date   INR 1.1 10/01/2020   INR 1.2 05/08/2020   INR 1.1 05/08/2020   No results found for: PTT  Antibiotics Anti-infectives (From admission, onward)    Start     Dose/Rate Route Frequency Ordered Stop   10/02/20 0600  vancomycin (VANCOREADY) IVPB 750 mg/150 mL        750 mg 150 mL/hr over 60 Minutes Intravenous Every 12 hours 10/01/20 2238 10/09/20 0559  10/02/20 0400  piperacillin-tazobactam (ZOSYN) IVPB 3.375 g        3.375 g 12.5 mL/hr over 240 Minutes Intravenous Every 8 hours 10/01/20 2151 10/09/20 0959   10/01/20 2200  piperacillin-tazobactam (ZOSYN) IVPB 3.375 g  Status:  Discontinued        3.375 g 12.5 mL/hr over 240 Minutes Intravenous Every 8 hours 10/01/20 2147 10/01/20 2151   10/01/20 1815  vancomycin (VANCOREADY) IVPB 1250 mg/250 mL        1,250 mg 166.7 mL/hr over 90 Minutes Intravenous  Once 10/01/20 1807 10/01/20 2047   10/01/20 1815  piperacillin-tazobactam (ZOSYN) IVPB 3.375 g        3.375 g 100 mL/hr over 30 Minutes Intravenous  Once 10/01/20 1807 10/01/20 1925        V. Charlena Cross, M.D., [facility] [place] Office: [phone] Pager:  [phone]

## 2020-10-02 NOTE — ED Notes (Signed)
Dr. Chestine Spore made aware of hgb level drop.

## 2020-10-03 MED ORDER — HEPARIN SODIUM (PORCINE) 5000 UNIT/ML IJ SOLN
5000.0000 [IU] | Freq: Three times a day (TID) | INTRAMUSCULAR | Status: DC
  Administered 2020-10-03 – 2020-10-16 (×35): 5000 [IU] via SUBCUTANEOUS
  Filled 2020-10-03 (×36): qty 1

## 2020-10-03 MED ORDER — ATORVASTATIN CALCIUM 40 MG PO TABS
40.0000 mg | ORAL_TABLET | Freq: Every day | ORAL | Status: DC
  Administered 2020-10-03 – 2020-10-16 (×13): 40 mg via ORAL
  Filled 2020-10-03 (×13): qty 1

## 2020-10-03 MED ORDER — BUPROPION HCL ER (XL) 150 MG PO TB24
150.0000 mg | ORAL_TABLET | Freq: Every morning | ORAL | Status: DC
  Administered 2020-10-03 – 2020-10-16 (×13): 150 mg via ORAL
  Filled 2020-10-03 (×13): qty 1

## 2020-10-03 MED ORDER — GABAPENTIN 100 MG PO CAPS
200.0000 mg | ORAL_CAPSULE | Freq: Two times a day (BID) | ORAL | Status: DC
  Administered 2020-10-03 – 2020-10-08 (×10): 200 mg via ORAL
  Filled 2020-10-03 (×11): qty 2

## 2020-10-03 MED ORDER — VITAMIN D (ERGOCALCIFEROL) 1.25 MG (50000 UNIT) PO CAPS
50000.0000 [IU] | ORAL_CAPSULE | ORAL | Status: DC
  Administered 2020-10-05 – 2020-10-15 (×4): 50000 [IU] via ORAL
  Filled 2020-10-03 (×4): qty 1

## 2020-10-03 MED ORDER — BOOST PO LIQD
237.0000 mL | Freq: Every day | ORAL | Status: DC
  Administered 2020-10-05 – 2020-10-16 (×12): 237 mL via ORAL
  Filled 2020-10-03 (×14): qty 237

## 2020-10-03 MED ORDER — CLOPIDOGREL BISULFATE 75 MG PO TABS
75.0000 mg | ORAL_TABLET | Freq: Every day | ORAL | Status: DC
  Administered 2020-10-03 – 2020-10-16 (×13): 75 mg via ORAL
  Filled 2020-10-03 (×13): qty 1

## 2020-10-03 NOTE — Progress Notes (Signed)
Pharmacy Antibiotic Note  Benjamin [name] is a 60 y.o. male admitted on 10/01/2020 with cellulitis. Patient has a history of HTN, HLD, & PAD. Patient presented to vascular and vein specialist 8/11 for ischemic left leg with purulent drainage from the left groin.  History is notable for bilateral iliac artery stents on 10/15/2019. Patient later developed left leg ulcer and requiring bypass on 11/13/2019, which was complicated by re-thrombosis requiring further bypass on 05/08/2020.   Recent admission on 09/05/2020 secondary to occlusion of left femoropopliteal bypass graft with purulent drainage. Patient underwent I&D in the OR with excision of the distal graft and was discharged with a VAC and PICC and 6 weeks IV cefazolin.    Pharmacy has been consulted for vancomycin dosing. Patient is receiving zosyn currently as well.  SCr is 0.76 which is at baseline for the patient. WBC 14.4. 7/18 culture with pan sensitive MSSA and staph lugdenesis.  Plan: Zosyn 3.375g q8h Vancomycin 1250 mg once then 750 mg q12hr unless change in renal function - eAUC 438.2 Monitor renal function and adjust dosing as indicated F/u cultures and vascular recs De-escalate antibiotics as appropriate  Height: 5\' 7"  (170.2 cm) Weight: 66.7 kg (147 lb) IBW/kg (Calculated) : 66.1  Temp (24hrs), Avg:98 F (36.7 C), Min:97.8 F (36.6 C), Max:98.3 F (36.8 C)  Recent Labs  Lab 10/01/20 1803 10/01/20 1848 10/01/20 2148  WBC 14.6*  --  14.4*  CREATININE 0.90  --  0.76  LATICACIDVEN  --  1.4  --      Estimated Creatinine Clearance: 90.7 mL/min (by C-G formula based on SCr of 0.76 mg/dL).    Allergies  Allergen Reactions   Aspirin Nausea And Vomiting   Garlic Other (See Comments)    sneezing   Antimicrobials this admission: Vancomycin 8/11 >>  Zosyn 8/11 >>   Microbiology results: 8/11 BCx x2: NGTD  Thank you for allowing pharmacy to be a part of this patient's care.  10/11, PharmD PGY1 Pharmacy  Resident 10/03/2020  11:09 AM  Please check [email or web address] for unit-specific pharmacy phone numbers.

## 2020-10-03 NOTE — H&P (View-Only) (Signed)
I came back to speak with the patient again.  He is considering proceeding with surgery tomorrow which would be a left above-knee amputation, I&D of the groin with removal of bypass graft and possible thrombectomy.  He is going to further discuss this with his brother and sister tonight.  Benjamin [name] 

## 2020-10-03 NOTE — Progress Notes (Signed)
Subjective  -   No acute overnight events   Physical Exam:  Left leg remains ischemic Small sinus tract in the left groin       Assessment/Plan:    I again had an extensive discussion with the patient requiring his need for above-knee amputation.  I told him that if he does not have this done this can become a life-threatening situation.  I still do not think he has full comprehension of his situation.  He would like to further discuss this with his brother who should be coming to see him in the near future.  He is worried about the burden he will place on his sister.  I again stressed the importance of proceeding with amputation before he gets toxic from this.  He is going to continue to contemplate his options.  Wells Kamira Mellette 10/03/2020 10:10 AM --  Vitals:   10/03/20 0421 10/03/20 0817  BP: 134/90 (!) 149/88  Pulse: 89 86  Resp: 18 17  Temp: 98.2 F (36.8 C) 98.3 F (36.8 C)  SpO2: 96% 96%    Intake/Output Summary (Last 24 hours) at 10/03/2020 1010 Last data filed at 10/03/2020 0433 Gross per 24 hour  Intake 2289 ml  Output 2200 ml  Net 89 ml     Laboratory CBC    Component Value Date/Time   WBC 14.4 (H) 10/01/2020 2148   HGB 8.4 (L) 10/01/2020 2148   HGB 12.2 (L) 07/22/2020 1150   HCT 26.0 (L) 10/01/2020 2148   HCT 37.4 (L) 07/22/2020 1150   PLT 512 (H) 10/01/2020 2148   PLT 419 07/22/2020 1150    BMET    Component Value Date/Time   NA 134 (L) 10/01/2020 2148   NA 140 07/22/2020 1150   K 3.8 10/01/2020 2148   CL 105 10/01/2020 2148   CO2 19 (L) 10/01/2020 2148   GLUCOSE 133 (H) 10/01/2020 2148   BUN 19 10/01/2020 2148   BUN 14 07/22/2020 1150   CREATININE 0.76 10/01/2020 2148   CREATININE 1.02 01/20/2017 0908   CALCIUM 8.2 (L) 10/01/2020 2148   GFRNONAA >60 10/01/2020 2148   GFRAA >60 11/14/2019 0508    COAG Lab Results  Component Value Date   INR 1.1 10/01/2020   INR 1.2 05/08/2020   INR 1.1 05/08/2020   No results found for:  PTT  Antibiotics Anti-infectives (From admission, onward)    Start     Dose/Rate Route Frequency Ordered Stop   10/02/20 0600  vancomycin (VANCOREADY) IVPB 750 mg/150 mL        750 mg 150 mL/hr over 60 Minutes Intravenous Every 12 hours 10/01/20 2238 10/09/20 0559   10/02/20 0400  piperacillin-tazobactam (ZOSYN) IVPB 3.375 g        3.375 g 12.5 mL/hr over 240 Minutes Intravenous Every 8 hours 10/01/20 2151 10/09/20 0959   10/01/20 2200  piperacillin-tazobactam (ZOSYN) IVPB 3.375 g  Status:  Discontinued        3.375 g 12.5 mL/hr over 240 Minutes Intravenous Every 8 hours 10/01/20 2147 10/01/20 2151   10/01/20 1815  vancomycin (VANCOREADY) IVPB 1250 mg/250 mL        1,250 mg 166.7 mL/hr over 90 Minutes Intravenous  Once 10/01/20 1807 10/01/20 2047   10/01/20 1815  piperacillin-tazobactam (ZOSYN) IVPB 3.375 g        3.375 g 100 mL/hr over 30 Minutes Intravenous  Once 10/01/20 1807 10/01/20 1925        V. Charlena Cross, M.D., FACS  [facility] [place] Office: [phone] Pager:  [phone]

## 2020-10-03 NOTE — Progress Notes (Signed)
I came back to speak with the patient again.  He is considering proceeding with surgery tomorrow which would be a left above-knee amputation, I&D of the groin with removal of bypass graft and possible thrombectomy.  He is going to further discuss this with his brother and sister tonight.  [name]

## 2020-10-03 NOTE — H&P (View-Only) (Signed)
  Subjective  -   No acute overnight events   Physical Exam:  Left leg remains ischemic Small sinus tract in the left groin       Assessment/Plan:    I again had an extensive discussion with the patient requiring his need for above-knee amputation.  I told him that if he does not have this done this can become a life-threatening situation.  I still do not think he has full comprehension of his situation.  He would like to further discuss this with his brother who should be coming to see him in the near future.  He is worried about the burden he will place on his sister.  I again stressed the importance of proceeding with amputation before he gets toxic from this.  He is going to continue to contemplate his options.  Benjamin [name] 10/03/2020 10:10 AM --  Vitals:   10/03/20 0421 10/03/20 0817  BP: 134/90 (!) 149/88  Pulse: 89 86  Resp: 18 17  Temp: 98.2 F (36.8 C) 98.3 F (36.8 C)  SpO2: 96% 96%    Intake/Output Summary (Last 24 hours) at 10/03/2020 1010 Last data filed at 10/03/2020 0433 Gross per 24 hour  Intake 2289 ml  Output 2200 ml  Net 89 ml     Laboratory CBC    Component Value Date/Time   WBC 14.4 (H) 10/01/2020 2148   HGB 8.4 (L) 10/01/2020 2148   HGB 12.2 (L) 07/22/2020 1150   HCT 26.0 (L) 10/01/2020 2148   HCT 37.4 (L) 07/22/2020 1150   PLT 512 (H) 10/01/2020 2148   PLT 419 07/22/2020 1150    BMET    Component Value Date/Time   NA 134 (L) 10/01/2020 2148   NA 140 07/22/2020 1150   K 3.8 10/01/2020 2148   CL 105 10/01/2020 2148   CO2 19 (L) 10/01/2020 2148   GLUCOSE 133 (H) 10/01/2020 2148   BUN 19 10/01/2020 2148   BUN 14 07/22/2020 1150   CREATININE 0.76 10/01/2020 2148   CREATININE 1.02 01/20/2017 0908   CALCIUM 8.2 (L) 10/01/2020 2148   GFRNONAA >60 10/01/2020 2148   GFRAA >60 11/14/2019 0508    COAG Lab Results  Component Value Date   INR 1.1 10/01/2020   INR 1.2 05/08/2020   INR 1.1 05/08/2020   No results found for:  PTT  Antibiotics Anti-infectives (From admission, onward)    Start     Dose/Rate Route Frequency Ordered Stop   10/02/20 0600  vancomycin (VANCOREADY) IVPB 750 mg/150 mL        750 mg 150 mL/hr over 60 Minutes Intravenous Every 12 hours 10/01/20 2238 10/09/20 0559   10/02/20 0400  piperacillin-tazobactam (ZOSYN) IVPB 3.375 g        3.375 g 12.5 mL/hr over 240 Minutes Intravenous Every 8 hours 10/01/20 2151 10/09/20 0959   10/01/20 2200  piperacillin-tazobactam (ZOSYN) IVPB 3.375 g  Status:  Discontinued        3.375 g 12.5 mL/hr over 240 Minutes Intravenous Every 8 hours 10/01/20 2147 10/01/20 2151   10/01/20 1815  vancomycin (VANCOREADY) IVPB 1250 mg/250 mL        1,250 mg 166.7 mL/hr over 90 Minutes Intravenous  Once 10/01/20 1807 10/01/20 2047   10/01/20 1815  piperacillin-tazobactam (ZOSYN) IVPB 3.375 g        3.375 g 100 mL/hr over 30 Minutes Intravenous  Once 10/01/20 1807 10/01/20 1925        V. Benjamin Jerney Baksh IV, M.D., FACS   [facility] [place] Office: [phone] Pager:  [phone]

## 2020-10-04 ENCOUNTER — Inpatient Hospital Stay (HOSPITAL_COMMUNITY): Payer: Medicaid Other | Admitting: Certified Registered Nurse Anesthetist

## 2020-10-04 ENCOUNTER — Encounter (HOSPITAL_COMMUNITY)
Admission: EM | Disposition: A | Discharge status: Skilled Nursing Facility | Payer: Self-pay | Source: Home / Self Care | Attending: Surgery

## 2020-10-04 ENCOUNTER — Encounter (HOSPITAL_COMMUNITY): Payer: Self-pay | Admitting: Surgery

## 2020-10-04 DIAGNOSIS — T827XXA Infection and inflammatory reaction due to other cardiac and vascular devices, implants and grafts, initial encounter: Secondary | ICD-10-CM

## 2020-10-04 DIAGNOSIS — I70222 Atherosclerosis of native arteries of extremities with rest pain, left leg: Secondary | ICD-10-CM

## 2020-10-04 HISTORY — PX: REMOVAL OF GRAFT: SHX6361

## 2020-10-04 HISTORY — PX: AMPUTATION: SHX166

## 2020-10-04 LAB — POCT I-STAT 7, (LYTES, BLD GAS, ICA,H+H)
Acid-base deficit: 1 mmol/L (ref 0.0–2.0)
Bicarbonate: 24.4 mmol/L (ref 20.0–28.0)
Calcium, Ion: 1.2 mmol/L (ref 1.15–1.40)
HCT: 23 % — ABNORMAL LOW (ref 39.0–52.0)
Hemoglobin: 7.8 g/dL — ABNORMAL LOW (ref 13.0–17.0)
O2 Saturation: 100 %
Patient temperature: 37.8
Potassium: 4 mmol/L (ref 3.5–5.1)
Sodium: 134 mmol/L — ABNORMAL LOW (ref 135–145)
TCO2: 26 mmol/L (ref 22–32)
pCO2 arterial: 46.2 mmHg (ref 32.0–48.0)
pH, Arterial: 7.335 — ABNORMAL LOW (ref 7.350–7.450)
pO2, Arterial: 302 mmHg — ABNORMAL HIGH (ref 83.0–108.0)

## 2020-10-04 LAB — BASIC METABOLIC PANEL
Anion gap: 8 (ref 5–15)
BUN: 5 mg/dL — ABNORMAL LOW (ref 8–23)
CO2: 22 mmol/L (ref 22–32)
Calcium: 8.1 mg/dL — ABNORMAL LOW (ref 8.9–10.3)
Chloride: 103 mmol/L (ref 98–111)
Creatinine, Ser: 0.67 mg/dL (ref 0.61–1.24)
GFR, Estimated: >60 mL/min (ref >60)
Glucose, Bld: 121 mg/dL — ABNORMAL HIGH (ref 70–99)
Potassium: 4 mmol/L (ref 3.5–5.1)
Sodium: 133 mmol/L — ABNORMAL LOW (ref 135–145)

## 2020-10-04 LAB — SURGICAL PCR SCREEN
MRSA, PCR: NEGATIVE
Staphylococcus aureus: NEGATIVE

## 2020-10-04 LAB — CBC
HCT: 23.6 % — ABNORMAL LOW (ref 39.0–52.0)
Hemoglobin: 8 g/dL — ABNORMAL LOW (ref 13.0–17.0)
MCH: 23.6 pg — ABNORMAL LOW (ref 26.0–34.0)
MCHC: 33.9 g/dL (ref 30.0–36.0)
MCV: 69.6 fL — ABNORMAL LOW (ref 80.0–100.0)
Platelets: 535 10*3/uL — ABNORMAL HIGH (ref 150–400)
RBC: 3.39 MIL/uL — ABNORMAL LOW (ref 4.22–5.81)
RDW: 18.1 % — ABNORMAL HIGH (ref 11.5–15.5)
WBC: 14.5 10*3/uL — ABNORMAL HIGH (ref 4.0–10.5)
nRBC: 0 % (ref 0.0–0.2)

## 2020-10-04 LAB — PREPARE RBC (CROSSMATCH)

## 2020-10-04 SURGERY — AMPUTATION, ABOVE KNEE
Anesthesia: General | Site: Leg Upper | Laterality: Left

## 2020-10-04 MED ORDER — LIDOCAINE 2% (20 MG/ML) 5 ML SYRINGE
INTRAMUSCULAR | Status: DC | PRN
  Administered 2020-10-04: 60 mg via INTRAVENOUS

## 2020-10-04 MED ORDER — FENTANYL CITRATE (PF) 250 MCG/5ML IJ SOLN
INTRAMUSCULAR | Status: DC | PRN
  Administered 2020-10-04: 100 ug via INTRAVENOUS
  Administered 2020-10-04 (×5): 50 ug via INTRAVENOUS

## 2020-10-04 MED ORDER — VANCOMYCIN HCL 1000 MG IV SOLR
INTRAVENOUS | Status: AC
  Filled 2020-10-04: qty 1000

## 2020-10-04 MED ORDER — FENTANYL CITRATE (PF) 250 MCG/5ML IJ SOLN
INTRAMUSCULAR | Status: AC
  Filled 2020-10-04: qty 5

## 2020-10-04 MED ORDER — PHENYLEPHRINE 40 MCG/ML (10ML) SYRINGE FOR IV PUSH (FOR BLOOD PRESSURE SUPPORT)
PREFILLED_SYRINGE | INTRAVENOUS | Status: DC | PRN
  Administered 2020-10-04: 80 ug via INTRAVENOUS

## 2020-10-04 MED ORDER — ACETAMINOPHEN 500 MG PO TABS: 1000.0000 mg | ORAL_TABLET | Freq: Once | ORAL | Status: DC | PRN

## 2020-10-04 MED ORDER — ACETAMINOPHEN 10 MG/ML IV SOLN: 1000.0000 mg | Freq: Once | INTRAVENOUS | Status: DC | PRN

## 2020-10-04 MED ORDER — PHENYLEPHRINE HCL-NACL 20-0.9 MG/250ML-% IV SOLN
INTRAVENOUS | Status: DC | PRN
  Administered 2020-10-04: 25 ug/min via INTRAVENOUS

## 2020-10-04 MED ORDER — HEPARIN 6000 UNIT IRRIGATION SOLUTION
Status: AC
  Filled 2020-10-04: qty 500

## 2020-10-04 MED ORDER — ONDANSETRON HCL 4 MG/2ML IJ SOLN
INTRAMUSCULAR | Status: DC | PRN
Start: 2020-10-04 — End: 2020-10-04
  Administered 2020-10-04: 4 mg via INTRAVENOUS

## 2020-10-04 MED ORDER — ROCURONIUM BROMIDE 10 MG/ML (PF) SYRINGE
PREFILLED_SYRINGE | INTRAVENOUS | Status: DC | PRN
  Administered 2020-10-04: 80 mg via INTRAVENOUS
  Administered 2020-10-04: 30 mg via INTRAVENOUS

## 2020-10-04 MED ORDER — CEFAZOLIN SODIUM-DEXTROSE 2-3 GM-%(50ML) IV SOLR
INTRAVENOUS | Status: DC | PRN
  Administered 2020-10-04: 2 g via INTRAVENOUS

## 2020-10-04 MED ORDER — ACETAMINOPHEN 160 MG/5ML PO SOLN: 1000.0000 mg | Freq: Once | ORAL | Status: DC | PRN

## 2020-10-04 MED ORDER — MIDAZOLAM HCL 2 MG/2ML IJ SOLN
INTRAMUSCULAR | Status: DC | PRN
  Administered 2020-10-04 (×2): 1 mg via INTRAVENOUS

## 2020-10-04 MED ORDER — OXYCODONE HCL 5 MG PO TABS: 5.0000 mg | ORAL_TABLET | Freq: Once | ORAL | Status: DC | PRN

## 2020-10-04 MED ORDER — SUGAMMADEX SODIUM 200 MG/2ML IV SOLN
INTRAVENOUS | Status: DC | PRN
  Administered 2020-10-04: 300 mg via INTRAVENOUS

## 2020-10-04 MED ORDER — ORAL CARE MOUTH RINSE: 15.0000 mL | Freq: Once | OROMUCOSAL | Status: AC

## 2020-10-04 MED ORDER — SODIUM CHLORIDE 0.9% IV SOLUTION: Freq: Once | INTRAVENOUS | Status: DC

## 2020-10-04 MED ORDER — 0.9 % SODIUM CHLORIDE (POUR BTL) OPTIME
TOPICAL | Status: DC | PRN
  Administered 2020-10-04: 2000 mL

## 2020-10-04 MED ORDER — DEXAMETHASONE SODIUM PHOSPHATE 10 MG/ML IJ SOLN
INTRAMUSCULAR | Status: DC | PRN
Start: 2020-10-04 — End: 2020-10-04
  Administered 2020-10-04: 10 mg via INTRAVENOUS

## 2020-10-04 MED ORDER — CHLORHEXIDINE GLUCONATE 0.12 % MT SOLN
15.0000 mL | Freq: Once | OROMUCOSAL | Status: AC
  Administered 2020-10-04: 15 mL via OROMUCOSAL

## 2020-10-04 MED ORDER — HYDROMORPHONE HCL 1 MG/ML IJ SOLN
INTRAMUSCULAR | Status: DC | PRN
  Administered 2020-10-04: .5 mg via INTRAVENOUS

## 2020-10-04 MED ORDER — PROPOFOL 10 MG/ML IV BOLUS
INTRAVENOUS | Status: AC
  Filled 2020-10-04: qty 20

## 2020-10-04 MED ORDER — BUPIVACAINE LIPOSOME 1.3 % IJ SUSP
INTRAMUSCULAR | Status: AC
  Filled 2020-10-04: qty 20

## 2020-10-04 MED ORDER — MIDAZOLAM HCL 2 MG/2ML IJ SOLN
INTRAMUSCULAR | Status: AC
  Filled 2020-10-04: qty 2

## 2020-10-04 MED ORDER — METOPROLOL TARTRATE 5 MG/5ML IV SOLN: 2.5000 mg | Freq: Four times a day (QID) | INTRAVENOUS | Status: DC | PRN

## 2020-10-04 MED ORDER — FENTANYL CITRATE (PF) 100 MCG/2ML IJ SOLN: 25.0000 ug | INTRAMUSCULAR | Status: DC | PRN

## 2020-10-04 MED ORDER — OXYCODONE HCL 5 MG/5ML PO SOLN: 5.0000 mg | Freq: Once | ORAL | Status: DC | PRN

## 2020-10-04 MED ORDER — PROPOFOL 10 MG/ML IV BOLUS
INTRAVENOUS | Status: DC | PRN
  Administered 2020-10-04: 90 mg via INTRAVENOUS
  Administered 2020-10-04: 30 mg via INTRAVENOUS

## 2020-10-04 MED ORDER — BUPIVACAINE HCL (PF) 0.5 % IJ SOLN
INTRAMUSCULAR | Status: AC
  Filled 2020-10-04: qty 30

## 2020-10-04 MED ORDER — VANCOMYCIN HCL 1000 MG IV SOLR
INTRAVENOUS | Status: DC | PRN
  Administered 2020-10-04: 1000 mg

## 2020-10-04 MED ORDER — LACTATED RINGERS IV SOLN: INTRAVENOUS | Status: DC

## 2020-10-04 MED ORDER — HEPARIN 6000 UNIT IRRIGATION SOLUTION
Status: DC | PRN
  Administered 2020-10-04: 1

## 2020-10-04 MED ORDER — HYDROMORPHONE HCL 1 MG/ML IJ SOLN
INTRAMUSCULAR | Status: AC
  Filled 2020-10-04: qty 0.5

## 2020-10-04 MED ORDER — GENTAMICIN SULFATE 40 MG/ML IJ SOLN
INTRAMUSCULAR | Status: AC
  Filled 2020-10-04: qty 2

## 2020-10-04 MED ORDER — BUPIVACAINE LIPOSOME 1.3 % IJ SUSP
INTRAMUSCULAR | Status: DC | PRN
  Administered 2020-10-04: 98 mL

## 2020-10-04 MED ORDER — LACTATED RINGERS IV SOLN: INTRAVENOUS | Status: DC | PRN

## 2020-10-04 MED ORDER — GENTAMICIN SULFATE 40 MG/ML IJ SOLN
INTRAMUSCULAR | Status: DC | PRN
  Administered 2020-10-04: 80 mg

## 2020-10-04 SURGICAL SUPPLY — 64 items
BAG COUNTER SPONGE SURGICOUNT (BAG) ×5 IMPLANT
BLADE SAW GIGLI 510 (BLADE) ×2 IMPLANT
CANISTER SUCT 3000ML PPV (MISCELLANEOUS) ×5 IMPLANT
CATH EMB 3FR 80CM (CATHETERS) ×4 IMPLANT
CATH EMB 4FR 80CM (CATHETERS) ×2 IMPLANT
CLIP VESOCCLUDE MED 24/CT (CLIP) ×5 IMPLANT
CLIP VESOCCLUDE SM WIDE 24/CT (CLIP) ×5 IMPLANT
CNTNR URN SCR LID CUP LEK RST (MISCELLANEOUS) ×1 IMPLANT
CONT SPEC 4OZ STRL OR WHT (MISCELLANEOUS) ×5
COVER MAYO STAND STRL (DRAPES) ×5 IMPLANT
DERMABOND ADVANCED (GAUZE/BANDAGES/DRESSINGS) ×2
DERMABOND ADVANCED .7 DNX12 (GAUZE/BANDAGES/DRESSINGS) ×8 IMPLANT
ELECT BLADE 4.0 EZ CLEAN MEGAD (MISCELLANEOUS) ×5
ELECT BLADE 6.5 EXT (BLADE) ×2 IMPLANT
ELECT CAUTERY BLADE 6.4 (BLADE) IMPLANT
ELECT REM PT RETURN 9FT ADLT (ELECTROSURGICAL) ×5
ELECTRODE BLDE 4.0 EZ CLN MEGD (MISCELLANEOUS) ×4 IMPLANT
ELECTRODE REM PT RTRN 9FT ADLT (ELECTROSURGICAL) ×4 IMPLANT
FELT TEFLON 1X6 (MISCELLANEOUS) IMPLANT
GLOVE SURG POLYISO LF SZ7.5 (GLOVE) ×5 IMPLANT
GLOVE SURG UNDER POLY LF SZ7.5 (GLOVE) ×5 IMPLANT
GOWN STRL REUS W/ TWL LRG LVL3 (GOWN DISPOSABLE) ×8 IMPLANT
GOWN STRL REUS W/ TWL XL LVL3 (GOWN DISPOSABLE) ×4 IMPLANT
GOWN STRL REUS W/TWL LRG LVL3 (GOWN DISPOSABLE) ×10
GOWN STRL REUS W/TWL XL LVL3 (GOWN DISPOSABLE) ×5
HEMOSTAT SNOW SURGICEL 2X4 (HEMOSTASIS) IMPLANT
INSERT FOGARTY 61MM (MISCELLANEOUS) ×5 IMPLANT
INSERT FOGARTY SM (MISCELLANEOUS) ×10 IMPLANT
KIT BASIN OR (CUSTOM PROCEDURE TRAY) ×5 IMPLANT
KIT STIMULAN RAPID CURE  10CC (Orthopedic Implant) ×5 IMPLANT
KIT STIMULAN RAPID CURE 10CC (Orthopedic Implant) ×1 IMPLANT
KIT TURNOVER KIT B (KITS) ×5 IMPLANT
NS IRRIG 1000ML POUR BTL (IV SOLUTION) ×10 IMPLANT
PACK AORTA (CUSTOM PROCEDURE TRAY) ×5 IMPLANT
PAD ARMBOARD 7.5X6 YLW CONV (MISCELLANEOUS) ×10 IMPLANT
PENCIL BUTTON HOLSTER BLD 10FT (ELECTRODE) IMPLANT
SPONGE T-LAP 18X18 ~~LOC~~+RFID (SPONGE) ×6 IMPLANT
STAPLER VISISTAT 35W (STAPLE) ×2 IMPLANT
SUT ETHIBOND 5 LR DA (SUTURE) IMPLANT
SUT PDS AB 1 TP1 54 (SUTURE) ×6 IMPLANT
SUT PROLENE 3 0 SH 48 (SUTURE) ×10 IMPLANT
SUT PROLENE 3 0 SH DA (SUTURE) IMPLANT
SUT PROLENE 5 0 C 1 24 (SUTURE) ×2 IMPLANT
SUT PROLENE 5 0 C 1 36 (SUTURE) IMPLANT
SUT SILK 2 0 (SUTURE) ×5
SUT SILK 2 0 TIES 17X18 (SUTURE) ×5
SUT SILK 2 0SH CR/8 30 (SUTURE) ×5 IMPLANT
SUT SILK 2-0 18XBRD TIE 12 (SUTURE) ×4 IMPLANT
SUT SILK 2-0 18XBRD TIE BLK (SUTURE) ×4 IMPLANT
SUT SILK 3 0 (SUTURE) ×5
SUT SILK 3 0 TIES 17X18 (SUTURE) ×5
SUT SILK 3-0 18XBRD TIE 12 (SUTURE) ×4 IMPLANT
SUT SILK 3-0 18XBRD TIE BLK (SUTURE) ×4 IMPLANT
SUT VIC AB 2-0 CT1 27 (SUTURE) ×10
SUT VIC AB 2-0 CT1 TAPERPNT 27 (SUTURE) ×17 IMPLANT
SUT VIC AB 3-0 SH 27 (SUTURE) ×10
SUT VIC AB 3-0 SH 27X BRD (SUTURE) ×8 IMPLANT
SUT VICRYL 4-0 PS2 18IN ABS (SUTURE) ×16 IMPLANT
SWAB COLLECTION DEVICE MRSA (MISCELLANEOUS) ×2 IMPLANT
SWAB CULTURE ESWAB REG 1ML (MISCELLANEOUS) ×2 IMPLANT
TOWEL GREEN STERILE (TOWEL DISPOSABLE) ×5 IMPLANT
TOWEL ~~LOC~~+RFID 17X26 BLUE (SPONGE) ×6 IMPLANT
TRAY FOLEY MTR SLVR 16FR STAT (SET/KITS/TRAYS/PACK) ×5 IMPLANT
WATER STERILE IRR 1000ML POUR (IV SOLUTION) ×10 IMPLANT

## 2020-10-04 NOTE — Transfer of Care (Signed)
Immediate Anesthesia Transfer of Care Note  Patient: Benjamin [name]  Procedure(s) Performed: LEFT ABOVE KNEE AMPUTATION (Left: Knee) REMOVAL OF LEFT FEMORAL TO POPITEAL BYPASS GRAFT (Left: Leg Upper) REDO LEFT FERMORAL ARTERY GROIN EXPOSURE, PLACEMENT OF ANTIBIOTIC BEADS (Left: Groin)  Patient Location: PACU  Anesthesia Type:General  Level of Consciousness: awake, alert  and oriented  Airway & Oxygen Therapy: Patient Spontanous Breathing  Post-op Assessment: Report given to RN and Post -op Vital signs reviewed and stable  Post vital signs: Reviewed and stable  Last Vitals:  Vitals Value Taken Time  BP 134/95 10/04/20 1325  Temp    Pulse 94 10/04/20 1328  Resp 12 10/04/20 1328  SpO2 93 % 10/04/20 1328  Vitals shown include unvalidated device data.  Last Pain:  Vitals:   10/04/20 0919  TempSrc: Oral  PainSc: 3       Patients Stated Pain Goal: 2 (10/02/20 0800)  Complications: No notable events documented.

## 2020-10-04 NOTE — Op Note (Signed)
Patient name: Benjamin [name] MRN: [id number] DOB: 1960/11/23 Sex: male  10/04/2020 Pre-operative Diagnosis: Ischemic left foot and leg Post-operative diagnosis:  Same Surgeon:  [name] Assistants:  Wendi Maya Procedure:   #1: Left above-knee amputation   #2: Removal of left femoral-popliteal bypass graft   #3: Redo exposure of left femoral artery   #4: Placement of antibiotic impregnated beads Anesthesia:  General  Blood Loss:  200 cc Specimens:  right leg.  Cultures of the wound were sent to microbiology  Findings: Marginally viable muscle at the amputation site.  The Gore-Tex femoral-popliteal graft was not incorporated.  There was purulence around the anastomosis proximally in the groin.  The graft was removed in its entirety.  The left limb of the aortobifemoral graft was also not incorporated.  It was ligated high under the inguinal ligament where it was incorporated.  There was no blood flow in the common femoral artery.  Antibiotic impregnated beads were placed at the amputation site, and the femoral-popliteal graft tunnel, and in the groin.  Indications: This is a 60 year old gentleman who has a extensive history of revascularization for limb salvage secondary to an ulcer.  He presented to the office on Friday with a grossly ischemic left foot and leg up to the knee where he had no motor or sensory function.  Over the weekend I have discussed with him that his leg is not viable and will need an amputation.  He had difficulty comprehending this.  His brother came in today and we had an extensive conversation prior to surgery.  Ultimately we are in agreement that above-knee amputation is necessary.  We also discussed removing his bypass grafts.  He does not have any other options for revascularization of the left leg.  Hopefully the above-knee amputation will heal.  Procedure:  The patient was identified in the holding area and taken to [facility] OR ROOM 16  The patient was then placed  supine on the table. general anesthesia was administered.  The patient was prepped and draped in the usual sterile fashion.  A time out was called and antibiotics were administered.  A PA was necessary to expedite the procedure and assist with technical details.  I initially began by reopening the patient's left femoral incision.  Immediately upon opening the incision there was gross purulent drainage from the inferior aspect of the incision.  Cultures of this were taken and sent to microbiology.  I then proceeded to dissect out the Gore-Tex femoral-popliteal graft which was not incorporated.  I removed this off of the left limb of the aortobifemoral graft.  I was able to pull the graft out through the groin incision in its entirety as it was not incorporated.  I then explored the common femoral artery.  I tried to pass a Fogarty catheter down the profunda but it would not pass.  The left limb of the aortobifemoral graft at the level of the common femoral artery was not incorporated.  It did appear to be incorporated up under the inguinal ligament.  I felt the only option was to ligate the graft as far proximally as possible.  Once this was done the wound was irrigated and packed with a wet lap.  Attention was then turned towards the lower leg.  An above-knee fishmouth incision was made for the amputation.  Cautery was used divide the subcutaneous tissue and fascia.  I then circumferentially exposed the femur.  A periosteal elevator was utilized.  The femur was then  transected with a Gigli saw.  I then proceeded to divide the remaining muscle.  Some of the muscle bellies appeared to be very healthy.  On the posterior and medial aspect, the muscle was marginal.  I did all contract with cautery stimulation.  I then identified the neurovascular bundle and divided this between clamps.  The leg was removed as a specimen.  I dissected out the femoral nerve.  This was infiltrated with Exparel and ligated proximal to  the cut edge of the femur.  Similarly the artery and vein were ligated with 2-0 silk ties.  The bone edge was smoothed with a rasp.  The wound was then irrigated.  Hemostasis was achieved.  The fascia was reapproximated with interrupted 2-0 Vicryl followed by staples.  I did place antibiotic beads around the amputation site under the fascia.  Attention was then turned back towards the groin.  This was again irrigated.  I placed antibiotic beads down into the bypass graft tunnel and then up under the inguinal ligament and in the groin incision.  I reapproximated the tissue over top of the artery with interrupted 2-0 Vicryl.  A Betadine soaked gauze was placed over the incision followed by sterile dressing.  The patient was then successfully extubated and taken to recovery in stable condition.  There were no immediate complications.   Disposition: To PACU stable.   Juleen China, M.D., [facility] [place] Office: [phone] Pager:  [phone]

## 2020-10-04 NOTE — Anesthesia Preprocedure Evaluation (Signed)
Anesthesia Evaluation  Patient identified by MRN, date of birth, ID band Patient awake    Reviewed: Allergy & Precautions, NPO status , Patient's Chart, lab work & pertinent test results  History of Anesthesia Complications Negative for: history of anesthetic complications  Airway Mallampati: I  TM Distance: >3 FB Neck ROM: Full    Dental  (+) Edentulous Upper, Edentulous Lower   Pulmonary neg shortness of breath, neg COPD, neg recent URI, Current Smoker and Patient abstained from smoking.,  Covid-19 Nucleic Acid Test Results Lab Results      Component                Value               Date                      SARSCOV2NAA              NEGATIVE            10/02/2020                SARSCOV2NAA              NEGATIVE            09/05/2020                SARSCOV2NAA              NEGATIVE            05/07/2020                SARSCOV2NAA              NEGATIVE            11/12/2019                SARSCOV2NAA              NEGATIVE            10/14/2019              breath sounds clear to auscultation       Cardiovascular hypertension, (-) angina+ Peripheral Vascular Disease  (-) Past MI and (-) CHF  Rhythm:Regular   - Left ventricle: The cavity size was mildly dilated. Wall  thickness was normal. Systolic function was mildly reduced. The  estimated ejection fraction was in the range of 45% to 50%.  Diffuse hypokinesis. Left ventricular diastolic function  parameters were normal.  - Atrial septum: No defect or patent foramen ovale was identified.  ? Nuclear stress EF: 45%. Visually appears 50%. ? There was no ST segment deviation noted during stress. ? This is a low risk study.   Mildly reduced EF which is known from prior echocardiogram.  No ischemia or infarction on perfusion images.    Neuro/Psych negative neurological ROS  negative psych ROS   GI/Hepatic negative GI ROS, Neg liver ROS,   Endo/Other     Renal/GU negative Renal ROSLab Results      Component                Value               Date                      CREATININE               0.67  10/04/2020           Lab Results      Component                Value               Date                      K                        4.0                 10/04/2020                Musculoskeletal   Abdominal   Peds  Hematology  (+) Blood dyscrasia, anemia , Lab Results      Component                Value               Date                      WBC                      14.5 (H)            10/04/2020                HGB                      8.0 (L)             10/04/2020                HCT                      23.6 (L)            10/04/2020                MCV                      69.6 (L)            10/04/2020                PLT                      535 (H)             10/04/2020            Lab Results      Component                Value               Date                      INR                      1.1                 10/01/2020                INR                      1.2  05/08/2020                INR                      1.1                 05/08/2020              Anesthesia Other Findings   Reproductive/Obstetrics                             Anesthesia Physical Anesthesia Plan  ASA: 4  Anesthesia Plan: General   Post-op Pain Management:    Induction: Intravenous  PONV Risk Score and Plan: 1 and Ondansetron and Dexamethasone  Airway Management Planned: Oral ETT  Additional Equipment: Arterial line, CVP and Ultrasound Guidance Line Placement  Intra-op Plan:   Post-operative Plan: Possible Post-op intubation/ventilation  Informed Consent: I have reviewed the patients History and Physical, chart, labs and discussed the procedure including the risks, benefits and alternatives for the proposed anesthesia with the patient or authorized representative who has indicated  his/her understanding and acceptance.     Dental advisory given  Plan Discussed with: Anesthesiologist and CRNA  Anesthesia Plan Comments:         Anesthesia Quick Evaluation

## 2020-10-04 NOTE — Anesthesia Procedure Notes (Signed)
Arterial Line Insertion Start/End8/14/2022 10:30 AM, 10/04/2020 10:35 AM Performed by: Dairl Ponder, CRNA  Patient location: Pre-op. Preanesthetic checklist: patient identified, IV checked, site marked, risks and benefits discussed, surgical consent, monitors and equipment checked, pre-op evaluation, timeout performed and anesthesia consent Lidocaine 1% used for infiltration Left, radial was placed Catheter size: 20 G Hand hygiene performed  and maximum sterile barriers used   Attempts: 1 Procedure performed without using ultrasound guided technique. Following insertion, dressing applied. Post procedure assessment: normal and unchanged

## 2020-10-04 NOTE — Interval H&P Note (Signed)
History and Physical Interval Note:  10/04/2020 7:45 AM  Benjamin [name]  has presented today for surgery, with the diagnosis of Arterial Occlusion.  The various methods of treatment have been discussed with the patient and family. After consideration of risks, benefits and other options for treatment, the patient has consented to  Procedure(s): THROMBECTOMY AORTA BIFEMORAL BYPASS GRAFT WITH  ABOVE KNEE AMPUTATION (Left) as a surgical intervention.  The patient's history has been reviewed, patient examined, no change in status, stable for surgery.  I have reviewed the patient's chart and labs.  Questions were answered to the patient's satisfaction.     Wells Sindhu Nguyen   

## 2020-10-04 NOTE — Interval H&P Note (Signed)
History and Physical Interval Note:  10/04/2020 7:45 AM  Benjamin [name]  has presented today for surgery, with the diagnosis of Arterial Occlusion.  The various methods of treatment have been discussed with the patient and family. After consideration of risks, benefits and other options for treatment, the patient has consented to  Procedure(s): THROMBECTOMY AORTA BIFEMORAL BYPASS GRAFT WITH  ABOVE KNEE AMPUTATION (Left) as a surgical intervention.  The patient's history has been reviewed, patient examined, no change in status, stable for surgery.  I have reviewed the patient's chart and labs.  Questions were answered to the patient's satisfaction.     [name]

## 2020-10-04 NOTE — Anesthesia Procedure Notes (Signed)
Central Venous Catheter Insertion Performed by: Val Eagle, MD, anesthesiologist Start/End8/14/2022 11:23 AM, 10/04/2020 11:30 AM Patient location: OR. Preanesthetic checklist: patient identified, IV checked, site marked, risks and benefits discussed, surgical consent, monitors and equipment checked, pre-op evaluation, timeout performed and anesthesia consent Lidocaine 1% used for infiltration and patient sedated Hand hygiene performed  and maximum sterile barriers used  Catheter size: 8 Fr Total catheter length 16. Central line was placed.Double lumen Procedure performed using ultrasound guided technique. Ultrasound Notes:image(s) printed for medical record Attempts: 1 Following insertion, dressing applied, line sutured and Biopatch. Post procedure assessment: blood return through all ports and free fluid flow  Patient tolerated the procedure well with no immediate complications.

## 2020-10-04 NOTE — Anesthesia Procedure Notes (Signed)
Procedure Name: Intubation Date/Time: 10/04/2020 11:10 AM Performed by: Clearnce Sorrel, CRNA Pre-anesthesia Checklist: Patient identified, Emergency Drugs available, Suction available and Patient being monitored Patient Re-evaluated:Patient Re-evaluated prior to induction Oxygen Delivery Method: Circle System Utilized Preoxygenation: Pre-oxygenation with 100% oxygen Induction Type: IV induction Ventilation: Mask ventilation without difficulty Laryngoscope Size: Mac and 4 Grade View: Grade I Tube type: Oral Tube size: 7.5 mm Number of attempts: 1 Airway Equipment and Method: Stylet and Oral airway Placement Confirmation: ETT inserted through vocal cords under direct vision, positive ETCO2 and breath sounds checked- equal and bilateral Secured at: 23 cm Tube secured with: Tape Dental Injury: Teeth and Oropharynx as per pre-operative assessment

## 2020-10-04 NOTE — Progress Notes (Signed)
Patient returned from surgery approximately 1500. He is awake, alert and oriented. Left AKA wrapped with gauze and compression wrap. PACU RN reports incision packed and left open under dressing. Infected graft removed from left groin. Wound pack with ABX beads and covered with guaze and tape. VS stable. Patient denies pain at this time. Emotional reassurance given. Brynda Rim, RN

## 2020-10-05 ENCOUNTER — Encounter (HOSPITAL_COMMUNITY): Payer: Self-pay | Admitting: Surgery

## 2020-10-05 DIAGNOSIS — T827XXA Infection and inflammatory reaction due to other cardiac and vascular devices, implants and grafts, initial encounter: Secondary | ICD-10-CM

## 2020-10-05 DIAGNOSIS — I70222 Atherosclerosis of native arteries of extremities with rest pain, left leg: Secondary | ICD-10-CM

## 2020-10-05 DIAGNOSIS — Z89612 Acquired absence of left leg above knee: Secondary | ICD-10-CM

## 2020-10-05 LAB — BASIC METABOLIC PANEL
Anion gap: 8 (ref 5–15)
BUN: 10 mg/dL (ref 8–23)
CO2: 23 mmol/L (ref 22–32)
Calcium: 8.5 mg/dL — ABNORMAL LOW (ref 8.9–10.3)
Chloride: 101 mmol/L (ref 98–111)
Creatinine, Ser: 0.63 mg/dL (ref 0.61–1.24)
GFR, Estimated: >60 mL/min (ref >60)
Glucose, Bld: 159 mg/dL — ABNORMAL HIGH (ref 70–99)
Potassium: 3.8 mmol/L (ref 3.5–5.1)
Sodium: 132 mmol/L — ABNORMAL LOW (ref 135–145)

## 2020-10-05 LAB — POCT I-STAT 7, (LYTES, BLD GAS, ICA,H+H)
Acid-base deficit: 3 mmol/L — ABNORMAL HIGH (ref 0.0–2.0)
Bicarbonate: 23.5 mmol/L (ref 20.0–28.0)
Calcium, Ion: 1.2 mmol/L (ref 1.15–1.40)
HCT: 26 % — ABNORMAL LOW (ref 39.0–52.0)
Hemoglobin: 8.8 g/dL — ABNORMAL LOW (ref 13.0–17.0)
O2 Saturation: 100 %
Patient temperature: 36.9
Potassium: 4.3 mmol/L (ref 3.5–5.1)
Sodium: 134 mmol/L — ABNORMAL LOW (ref 135–145)
TCO2: 25 mmol/L (ref 22–32)
pCO2 arterial: 46.5 mmHg (ref 32.0–48.0)
pH, Arterial: 7.311 — ABNORMAL LOW (ref 7.350–7.450)
pO2, Arterial: 191 mmHg — ABNORMAL HIGH (ref 83.0–108.0)

## 2020-10-05 LAB — CBC
HCT: 28.4 % — ABNORMAL LOW (ref 39.0–52.0)
Hemoglobin: 9.6 g/dL — ABNORMAL LOW (ref 13.0–17.0)
MCH: 24.6 pg — ABNORMAL LOW (ref 26.0–34.0)
MCHC: 33.8 g/dL (ref 30.0–36.0)
MCV: 72.8 fL — ABNORMAL LOW (ref 80.0–100.0)
Platelets: 518 10*3/uL — ABNORMAL HIGH (ref 150–400)
RBC: 3.9 MIL/uL — ABNORMAL LOW (ref 4.22–5.81)
RDW: 20.5 % — ABNORMAL HIGH (ref 11.5–15.5)
WBC: 17 10*3/uL — ABNORMAL HIGH (ref 4.0–10.5)
nRBC: 0 % (ref 0.0–0.2)

## 2020-10-05 LAB — VANCOMYCIN, PEAK: Vancomycin Pk: 17 ug/mL — ABNORMAL LOW (ref 30–40)

## 2020-10-05 LAB — VANCOMYCIN, TROUGH: Vancomycin Tr: 11 ug/mL — ABNORMAL LOW (ref 15–20)

## 2020-10-05 MED ORDER — DIPHENHYDRAMINE HCL 25 MG PO CAPS
25.0000 mg | ORAL_CAPSULE | Freq: Every evening | ORAL | Status: DC | PRN
  Administered 2020-10-05 – 2020-10-10 (×3): 25 mg via ORAL
  Filled 2020-10-05 (×4): qty 1

## 2020-10-05 NOTE — Progress Notes (Signed)
Follow-up CBC>> H and H = 9.6 and 28.3.  VS remain stable.

## 2020-10-05 NOTE — Social Work (Addendum)
Outpatient Heart/Vascular LCSW received referral for [facility] [employer]. LCSW very familiar with pt as I have worked with him while his Medicaid and Disability has been pending. LCSW reached out to pt sister [facility] for a check in and to assess needs [phone]). She shares that she has not yet been to see pt yet (she does have some concern about seeing pt amputation). However, other family members have gone by and share that pt in relatively good spirits. He was very hesitant about amputation, but pt sister shares she, pt and pt family understood that the risks were very high if he did not have the amputation.   In good news, pt had been approved for Medicaid prior to this admission and she received a letter from law firm assisting w/ pt disability case ([employer]([phone]) which seems to state that pt was approved for disability, payment pending. She will stay in touch with them if any additional questions about payments and status.   She shares that pt is very concerned also about making payment toward car this month being that he is not working, and in hospital. I shared that our team may be able to assist with that cost- requested that she call and get updated statements for car payment and insurance. I also encouraged her to speak with pt about the plan for the car and ongoing payments. Pt is the only one that uses the car and he may not be driving while he rehabs, they will need to consider what will happen if he continues to not have payment from disability claim. Pt sister will discuss w/ pt and in the meantime get updated statements for this Clinical research associate.   I thanked pt sister for all she, and her family, does/do for pt. Encouraged her to call me if she needs any additional support. Remain available for pt, pt family and TOC team for any outpatient needs at this time.   [name], MSW, LCSW [facility] Heart/Vascular Care Navigation  [phone]

## 2020-10-05 NOTE — Progress Notes (Signed)
Pharmacy Antibiotic Note  Benjamin [name] is a 60 y.o. male admitted on 10/01/2020 with cellulitis. Patient has a history of HTN, HLD, & PAD. Patient presented to vascular and vein specialist 8/11 for ischemic left leg with purulent drainage from the left groin.  History is notable for bilateral iliac artery stents on 10/15/2019. Patient later developed left leg ulcer and requiring bypass on 11/13/2019, which was complicated by re-thrombosis requiring further bypass on 05/08/2020.   Recent admission on 09/05/2020 secondary to occlusion of left femoropopliteal bypass graft with purulent drainage. Patient underwent I&D in the OR with excision of the distal graft and was discharged with a VAC and PICC and 6 weeks IV cefazolin.    Pharmacy has been consulted for vancomycin dosing. Patient is receiving zosyn currently as well.  Vanc levels drawn today on 750 mg IV q12hr Dose given at 0506 Vp = 17 at 1127 Vt = 11 at 1753 Calculated AUC of 415 Which is within the goal range of 400 - 550.  Plan: Continue Zosyn 3.375g q8h Continue Vancomycin 750 mg q12hr unless change in renal function  Monitor renal function and adjust dosing as indicated F/u cultures and vascular recs De-escalate antibiotics as appropriate  Height: 5\' 7"  (170.2 cm) Weight: 66.7 kg (147 lb) IBW/kg (Calculated) : 66.1  Temp (24hrs), Avg:97.7 F (36.5 C), Min:97.3 F (36.3 C), Max:97.8 F (36.6 C)  Recent Labs  Lab 10/01/20 1803 10/01/20 1848 10/01/20 2148 10/04/20 0215 10/05/20 0957 10/05/20 1127 10/05/20 1753  WBC 14.6*  --  14.4* 14.5* 17.0*  --   --   CREATININE 0.90  --  0.76 0.67 0.63  --   --   LATICACIDVEN  --  1.4  --   --   --   --   --   VANCOTROUGH  --   --   --   --   --   --  11*  VANCOPEAK  --   --   --   --   --  17*  --      Estimated Creatinine Clearance: 90.7 mL/min (by C-G formula based on SCr of 0.63 mg/dL).    Allergies  Allergen Reactions   Aspirin Nausea And Vomiting   Garlic Other (See  Comments)    sneezing   Antimicrobials this admission: Vancomycin 8/11 >>  Zosyn 8/11 >>   Microbiology results: 8/11 BCx x2: NGTD 8/14 Tissue cultures - ngtd (no organism seen)  Thank you for allowing pharmacy to be a part of this patient's care.  9/14, PharmD, [facility] Clinical Pharmacist Please see AMION for all Pharmacists' Contact Phone Numbers 10/05/2020, 8:17 PM

## 2020-10-05 NOTE — Progress Notes (Addendum)
Subjective  - POD #1, status post left above-knee amputation, removal of left femoral-popliteal and left limb of aortobifemoral bypass graft  No complaints this morning   Physical Exam:  Dressings remain clean dry and intact       Assessment/Plan:  POD #1  Will need wet-to-dry dressing changes to the left groin twice daily Acute blood loss anemia: Hemoglobin is 7.8 yesterday.  He did receive blood in the OR.  I will repeat his CBC today. Will need evaluation from PT OT and rehab I have asked Lasandra Beech to speak with the patient during this hospitalization to see how we may be able to utilize the patient care for line to provide assistance for this patient   10/05/2020 6:54 AM --  Vitals:   10/05/20 0030 10/05/20 0400  BP: (!) 145/96 (!) 148/97  Pulse: 69 85  Resp: 13 15  Temp: (!) 97.3 F (36.3 C) 97.8 F (36.6 C)  SpO2: 93% 95%    Intake/Output Summary (Last 24 hours) at 10/05/2020 0654 Last data filed at 10/05/2020 0400 Gross per 24 hour  Intake 2647 ml  Output 2300 ml  Net 347 ml     Laboratory CBC    Component Value Date/Time   WBC 14.5 (H) 10/04/2020 0215   HGB 7.8 (L) 10/04/2020 1128   HGB 12.2 (L) 07/22/2020 1150   HCT 23.0 (L) 10/04/2020 1128   HCT 37.4 (L) 07/22/2020 1150   PLT 535 (H) 10/04/2020 0215   PLT 419 07/22/2020 1150    BMET    Component Value Date/Time   NA 134 (L) 10/04/2020 1128   NA 140 07/22/2020 1150   K 4.0 10/04/2020 1128   CL 103 10/04/2020 0215   CO2 22 10/04/2020 0215   GLUCOSE 121 (H) 10/04/2020 0215   BUN 5 (L) 10/04/2020 0215   BUN 14 07/22/2020 1150   CREATININE 0.67 10/04/2020 0215   CREATININE 1.02 01/20/2017 0908   CALCIUM 8.1 (L) 10/04/2020 0215   GFRNONAA >60 10/04/2020 0215   GFRAA >60 11/14/2019 0508    COAG Lab Results  Component Value Date   INR 1.1 10/01/2020   INR 1.2 05/08/2020   INR 1.1 05/08/2020   No results found for: PTT  Antibiotics Anti-infectives (From admission, onward)     Start     Dose/Rate Route Frequency Ordered Stop   10/04/20 1249  vancomycin (VANCOCIN) powder  Status:  Discontinued          As needed 10/04/20 1249 10/04/20 1319   10/04/20 1249  gentamicin (GARAMYCIN) injection  Status:  Discontinued          As needed 10/04/20 1253 10/04/20 1319   10/02/20 0600  vancomycin (VANCOREADY) IVPB 750 mg/150 mL        750 mg 150 mL/hr over 60 Minutes Intravenous Every 12 hours 10/01/20 2238 10/09/20 0559   10/02/20 0400  piperacillin-tazobactam (ZOSYN) IVPB 3.375 g        3.375 g 12.5 mL/hr over 240 Minutes Intravenous Every 8 hours 10/01/20 2151 10/09/20 0959   10/01/20 2200  piperacillin-tazobactam (ZOSYN) IVPB 3.375 g  Status:  Discontinued        3.375 g 12.5 mL/hr over 240 Minutes Intravenous Every 8 hours 10/01/20 2147 10/01/20 2151   10/01/20 1815  vancomycin (VANCOREADY) IVPB 1250 mg/250 mL        1,250 mg 166.7 mL/hr over 90 Minutes Intravenous  Once 10/01/20 1807 10/01/20 2047   10/01/20 1815  piperacillin-tazobactam (ZOSYN) IVPB  3.375 g        3.375 g 100 mL/hr over 30 Minutes Intravenous  Once 10/01/20 1807 10/01/20 1925        V. Charlena Cross, M.D., [facility] [place] Office: [phone] Pager:  [phone]

## 2020-10-06 LAB — BPAM FFP
Blood Product Expiration Date: 202208142359
Blood Product Expiration Date: 202208152359
ISSUE DATE / TIME: 202208141254
ISSUE DATE / TIME: 202208141254
Unit Type and Rh: 6200
Unit Type and Rh: 6200

## 2020-10-06 LAB — PREPARE FRESH FROZEN PLASMA
Unit division: 0
Unit division: 0

## 2020-10-06 NOTE — Progress Notes (Addendum)
  Progress Note    10/06/2020 8:06 AM 2 Days Post-Op  Subjective:  no complaints   Vitals:   10/05/20 2306 10/06/20 0405  BP: (!) 154/103 (!) 162/107  Pulse: 84 74  Resp: 15 16  Temp: 97.7 F (36.5 C) 98 F (36.7 C)  SpO2: 100% 100%   Physical Exam: Cardiac: regular Lungs:  non labored Incisions:  left groin incision with antibiotic beads, tissue does not appear very healthy, dusky and pale, damp to dry packing reapplied. No purulence or drainage on compression. Left AKA well appearing, staples intact, no drainage. Extremities:  RLE well perfused and warm with Doppler PT and At signals Abdomen:  flat, non tender Neurologic: alert and oriented  CBC    Component Value Date/Time   WBC 17.0 (H) 10/05/2020 0957   RBC 3.90 (L) 10/05/2020 0957   HGB 9.6 (L) 10/05/2020 0957   HGB 12.2 (L) 07/22/2020 1150   HCT 28.4 (L) 10/05/2020 0957   HCT 37.4 (L) 07/22/2020 1150   PLT 518 (H) 10/05/2020 0957   PLT 419 07/22/2020 1150   MCV 72.8 (L) 10/05/2020 0957   MCV 76 (L) 07/22/2020 1150   MCH 24.6 (L) 10/05/2020 0957   MCHC 33.8 10/05/2020 0957   RDW 20.5 (H) 10/05/2020 0957   RDW 15.8 (H) 07/22/2020 1150   LYMPHSABS 0.7 10/01/2020 1803   MONOABS 0.9 10/01/2020 1803   EOSABS 0.0 10/01/2020 1803   BASOSABS 0.0 10/01/2020 1803    BMET    Component Value Date/Time   NA 132 (L) 10/05/2020 0957   NA 140 07/22/2020 1150   K 3.8 10/05/2020 0957   CL 101 10/05/2020 0957   CO2 23 10/05/2020 0957   GLUCOSE 159 (H) 10/05/2020 0957   BUN 10 10/05/2020 0957   BUN 14 07/22/2020 1150   CREATININE 0.63 10/05/2020 0957   CREATININE 1.02 01/20/2017 0908   CALCIUM 8.5 (L) 10/05/2020 0957   GFRNONAA >60 10/05/2020 0957   GFRAA >60 11/14/2019 0508    INR    Component Value Date/Time   INR 1.1 10/01/2020 2148     Intake/Output Summary (Last 24 hours) at 10/06/2020 0806 Last data filed at 10/06/2020 0400 Gross per 24 hour  Intake 1322.71 ml  Output 3465 ml  Net -2142.29 ml      Assessment/Plan:  60 y.o. male is s/p  #1: Left above-knee amputation                       #2: Removal of left femoral-popliteal bypass graft                       #3: Redo exposure of left femoral artery                       #4: Placement of antibiotic impregnated beads 2 Days Post-Op   Left AKA well appearing, viable flaps Left groin tissue does will need to be monitored. Tissue does not appear very healthy. Continue BID damp to dry dressing changes Final cultures pending. Continue Vanc/ Zosyn PT/ OT/ mobilize as tolerated  Graceann Congress, PA-C [facility] [phone] 10/06/2020 8:06 AM  I have seen and evaluated the patient and agree with the above assessment and plan  Wells Zianna Dercole

## 2020-10-06 NOTE — Progress Notes (Signed)
Mobility Specialist: Progress Note   10/06/20 1458  Mobility  Activity Ambulated in hall  Level of Assistance Contact guard assist, steadying assist  Assistive Device Front wheel walker  Distance Ambulated (ft) 110 ft  Mobility Ambulated with assistance in hallway  Mobility Response Tolerated well  Mobility performed by Mobility specialist  Bed Position Chair  $Mobility charge 1 Mobility   Pre-Mobility: 108 HR, 155/97 BP, 97% SpO2 During Mobility: 121 HR Post-Mobility: 104 HR, 149/104 BP, 100% SpO2  Pt c/o 7/10 pain before ambulation but said he was at a 5/10 pain post-ambulation. Pt able to stand with contact guard as well as during mobility. Pt did well with "hop to" gait. Pt to recliner after walk and is set-up with his lunch. Call bell and phone is in reach. Chair alarm is on.   [facility] Benjamin [name] Mobility Specialist Mobility Specialist Phone: [phone]

## 2020-10-07 LAB — CULTURE, BLOOD (ROUTINE X 2)
Culture: NO GROWTH
Culture: NO GROWTH
Special Requests: ADEQUATE

## 2020-10-07 LAB — SURGICAL PATHOLOGY

## 2020-10-07 NOTE — Anesthesia Postprocedure Evaluation (Signed)
Anesthesia Post Note  Patient: Benjamin [name]  Procedure(s) Performed: LEFT ABOVE KNEE AMPUTATION (Left: Knee) REMOVAL OF LEFT FEMORAL TO POPITEAL BYPASS GRAFT (Left: Leg Upper) REDO LEFT FERMORAL ARTERY GROIN EXPOSURE, PLACEMENT OF ANTIBIOTIC BEADS (Left: Groin)     Patient location during evaluation: PACU Anesthesia Type: General Level of consciousness: awake and alert Pain management: pain level controlled Vital Signs Assessment: post-procedure vital signs reviewed and stable Respiratory status: spontaneous breathing, nonlabored ventilation, respiratory function stable and patient connected to nasal cannula oxygen Cardiovascular status: blood pressure returned to baseline and stable Postop Assessment: no apparent nausea or vomiting Anesthetic complications: no   No notable events documented.  Last Vitals:  Vitals:   10/07/20 0333 10/07/20 0752  BP: (!) 152/99 (!) 141/96  Pulse: 84 94  Resp: 18 15  Temp: 36.8 C 36.8 C  SpO2: 95% 98%    Last Pain:  Vitals:   10/07/20 0752  TempSrc: Oral  PainSc:                  [name]

## 2020-10-07 NOTE — Evaluation (Signed)
Occupational Therapy Evaluation Patient Details Name: Benjamin [name] MRN: [id number] DOB: Feb 04, 1961 Today's Date: 10/07/2020    History of Present Illness This patient is a 60 year old male, he has a history of being a heavy smoker, hypertension hyperlipidemia and he has had significant peripheral arterial disease that has resulted in bilateral femoral artery disease and bypasses of his lower extremities.  He presents to the hospital from the vascular clinic with a cold leg that is weak and numb.  Symptoms started yesterday, got worse this morning, not associated with fevers or difficulty breathing, arrives by paramedic transport with a note of a cold leg without pulses.  Vascular to be paged on arrival.  Symptoms are persistent, severe, not associated with any new trauma. Patient is S/P L AKA.   Clinical Impression   Pt admitted for procedure and concerns listed above. PTA pt reported that he was independent with all ADL's and IADL's, using crutches. At this time, pt is requiring overall min guard-min A for all ADL's and functional mobility. Pt is safest with mobility when using a RW, however he reports that he prefers crutches. At this time, pt will benefit from [facility] once medically safe to D/C home. OT will follow acutely.     Follow Up Recommendations  Home health OT;Supervision - Intermittent    Equipment Recommendations  Tub/shower bench;Other (comment) (RW)    Recommendations for Other Services       Precautions / Restrictions Precautions Precautions: Fall Restrictions Weight Bearing Restrictions: Yes LLE Weight Bearing: Non weight bearing      Mobility Bed Mobility Overal bed mobility: Independent                  Transfers Overall transfer level: Needs assistance Equipment used: Crutches Transfers: Sit to/from Stand Sit to Stand: Min guard              Balance Overall balance assessment: Needs assistance Sitting-balance support: Feet supported Sitting  balance-Leahy Scale: Normal     Standing balance support: Bilateral upper extremity supported;During functional activity Standing balance-Leahy Scale: Fair Standing balance comment: would definitely be steadier with walker, needs B UE support and min guard with crutches - LOB with crutches, OT had pt switch to RW, much more stable.                           ADL either performed or assessed with clinical judgement   ADL Overall ADL's : Needs assistance/impaired Eating/Feeding: Independent;Sitting   Grooming: Min guard;Standing   Upper Body Bathing: Set up;Sitting   Lower Body Bathing: Minimal assistance;Moderate assistance;Sit to/from stand;Sitting/lateral leans   Upper Body Dressing : Independent;Sitting   Lower Body Dressing: Minimal assistance;Sitting/lateral leans;Sit to/from stand   Toilet Transfer: Min guard;Minimal assistance;Ambulation   Toileting- Clothing Manipulation and Hygiene: Minimal assistance;Sitting/lateral lean;Sit to/from stand   Tub/ Shower Transfer: Min guard;Minimal assistance;Ambulation   Functional mobility during ADLs: Min guard;Minimal assistance;Rolling walker (crutches) General ADL Comments: Pt overall at min guard-min A level for all ADL's and functional mobility     Vision Baseline Vision/History: No visual deficits Patient Visual Report: No change from baseline Vision Assessment?: No apparent visual deficits     Perception Perception Perception Tested?: No   Praxis Praxis Praxis tested?: Not tested    Pertinent Vitals/Pain Pain Assessment: 0-10 Pain Score: 4  Pain Location: L leg Pain Descriptors / Indicators: Sore Pain Intervention(s): Monitored during session;Repositioned;Patient requesting pain meds-RN notified     Hand Dominance  Right   Extremity/Trunk Assessment Upper Extremity Assessment Upper Extremity Assessment: Overall WFL for tasks assessed   Lower Extremity Assessment Lower Extremity Assessment: Defer to  PT evaluation LLE: Unable to fully assess due to pain   Cervical / Trunk Assessment Cervical / Trunk Assessment: Normal   Communication Communication Communication: No difficulties   Cognition Arousal/Alertness: Awake/alert Behavior During Therapy: WFL for tasks assessed/performed Overall Cognitive Status: Within Functional Limits for tasks assessed                                     General Comments  VSS on RA    Exercises     Shoulder Instructions      Home Living Family/patient expects to be discharged to:: Private residence Living Arrangements: Other relatives Available Help at Discharge: Family;Available 24 hours/day Type of Home: House Home Access: Stairs to enter [employer] of Steps: 4 Entrance Stairs-Rails: Left;Right;Can reach both Home Layout: One level     Bathroom Shower/Tub: Chief Strategy Officer: Standard     Home Equipment: Crutches   Additional Comments: Lives with sister who has MS, she uses walker to ambulate. Her son and his girlfirend also live there but do not help him much.      Prior Functioning/Environment Level of Independence: Independent with assistive device(s)        Comments: Uses RW for ambulation        OT Problem List: Decreased strength;Decreased range of motion;Impaired balance (sitting and/or standing);Decreased safety awareness;Decreased knowledge of use of DME or AE;Decreased coordination;Pain      OT Treatment/Interventions: Self-care/ADL training;Therapeutic exercise;Energy conservation;DME and/or AE instruction;Therapeutic activities;Patient/family education;Balance training    OT Goals(Current goals can be found in the care plan section) Acute Rehab OT Goals Patient Stated Goal: to go back home with sister OT Goal Formulation: With patient Time For Goal Achievement: 10/21/20 Potential to Achieve Goals: Good  OT Frequency: Min 2X/week   Barriers to D/C:             Co-evaluation              AM-PAC OT "6 Clicks" Daily Activity     Outcome Measure Help from another person eating meals?: None Help from another person taking care of personal grooming?: A Little Help from another person toileting, which includes using toliet, bedpan, or urinal?: A Little Help from another person bathing (including washing, rinsing, drying)?: A Little Help from another person to put on and taking off regular upper body clothing?: None Help from another person to put on and taking off regular lower body clothing?: A Little 6 Click Score: 20   End of Session Equipment Utilized During Treatment: Gait belt;Rolling walker (crutches) Nurse Communication: Mobility status;Patient requests pain meds  Activity Tolerance: Patient tolerated treatment well Patient left: in bed;with call bell/phone within reach;with bed alarm set  OT Visit Diagnosis: Unsteadiness on feet (R26.81);Other abnormalities of gait and mobility (R26.89);Muscle weakness (generalized) (M62.81);Pain Pain - Right/Left: Left Pain - part of body: Leg                Time: 0300-9233 OT Time Calculation (min): 24 min Charges:  OT General Charges $OT Visit: 1 Visit OT Evaluation $OT Eval Moderate Complexity: 1 Mod OT Treatments $Self Care/Home Management : 8-22 mins  Trevian Hayashida H., OTR/L Acute Rehabilitation  Garyn Arlotta Elane Bing Plume 10/07/2020, 6:43 PM

## 2020-10-07 NOTE — Evaluation (Addendum)
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  He presents to the hospital from the vascular clinic with a cold leg that is weak and numb.  Symptoms started yesterday, got worse this morning, not associated with fevers or difficulty breathing, arrives by paramedic transport with a note of a cold leg without pulses.  Vascular to be paged on arrival.  Symptoms are persistent, severe, not associated with any new trauma. Patient is S/P L AKA.   Clinical Impression  Patient received in bed, agrees to PT assessment. Patient would like to try using crutches which are in room. He is independent with bed mobility. Transfers with min guard. Cues for safety with crutches. Patient ambulated 40 feet in room with B crutches. Had one minor incident of unsteadiness. Min guard. Patient will continue to benefit from skilled PT while here to improve functional mobility, independence and safety.     Follow Up Recommendations Home health PT    Equipment Recommendations  Wheelchair (measurements PT);Wheelchair cushion (measurements PT)    Recommendations for Other Services       Precautions / Restrictions Precautions Precautions: Fall Restrictions Weight Bearing Restrictions: Yes LLE Weight Bearing: Non weight bearing      Mobility  Bed Mobility Overal bed mobility: Modified Independent                  Transfers Overall transfer level: Needs assistance Equipment used: Crutches Transfers: Sit to/from Stand Sit to Stand: Min guard            Ambulation/Gait Ambulation/Gait assistance: Min guard Gait Distance (Feet): 40 Feet Assistive  device: Crutches Gait Pattern/deviations: Step-to pattern Gait velocity: decr   General Gait Details: Patient wanted to use his crutches which were in room. He does fairly well with them, had one incident of unsteadiness.  Stairs            Wheelchair Mobility    Modified Rankin (Stroke Patients Only)       Balance Overall balance assessment: Needs assistance Sitting-balance support: Feet supported Sitting balance-Leahy Scale: Normal     Standing balance support: Bilateral upper extremity supported;During functional activity Standing balance-Leahy Scale: Fair Standing balance comment: would definitely be steadier with walker, needs B UE support and min guard with crutches                             Pertinent Vitals/Pain Pain Assessment: 0-10 Pain Score: 4  Pain Location: L leg Pain Descriptors / Indicators: Sore Pain Intervention(s): Monitored during session    Home Living Family/patient expects to be discharged to:: Private residence Living Arrangements: Other relatives Available Help at Discharge: Family;Available 24 hours/day Type of Home: House Home Access: Stairs to enter Entrance Stairs-Rails: Left;Right;Can reach both Entrance Stairs-Number of Steps: 4 Home Layout: One level Home Equipment: Walker - 2 wheels;Crutches Additional Comments: Lives with sister who has MS, she uses walker to ambulate. Her son and his girlfirend also live there but do not help him much.    Prior Function Level of Independence: Independent with assistive device(s)         Comments: Uses RW for ambulation  Hand Dominance   Dominant Hand: Right    Extremity/Trunk Assessment   Upper Extremity Assessment Upper Extremity Assessment: Defer to OT evaluation    Lower Extremity Assessment Lower Extremity Assessment: LLE deficits/detail LLE: Unable to fully assess due to pain    Cervical / Trunk Assessment Cervical / Trunk Assessment: Normal   Communication   Communication: No difficulties  Cognition Arousal/Alertness: Awake/alert Behavior During Therapy: WFL for tasks assessed/performed Overall Cognitive Status: Within Functional Limits for tasks assessed                                        General Comments      Exercises     Assessment/Plan    PT Assessment Patient needs continued PT services  PT Problem List Decreased strength;Decreased mobility;Decreased coordination;Decreased balance;Pain;Decreased activity tolerance;Decreased skin integrity       PT Treatment Interventions DME instruction;Therapeutic exercise;Gait training;Balance training;Stair training;Functional mobility training;Therapeutic activities;Patient/family education;Wheelchair mobility training    PT Goals (Current goals can be found in the Care Plan section)  Acute Rehab PT Goals Patient Stated Goal: to go back home with sister PT Goal Formulation: With patient Time For Goal Achievement: 10/21/20 Potential to Achieve Goals: Good    Frequency Min 3X/week   Barriers to discharge Inaccessible home environment;Decreased caregiver support has 4 steps to enter with B rails, lives with sister but she uses walker as well for mobility    Co-evaluation               AM-PAC PT "6 Clicks" Mobility  Outcome Measure Help needed turning from your back to your side while in a flat bed without using bedrails?: None Help needed moving from lying on your back to sitting on the side of a flat bed without using bedrails?: None Help needed moving to and from a bed to a chair (including a wheelchair)?: A Little Help needed standing up from a chair using your arms (e.g., wheelchair or bedside chair)?: A Little Help needed to walk in hospital room?: A Little Help needed climbing 3-5 steps with a railing? : A Little 6 Click Score: 20    End of Session Equipment Utilized During Treatment: Gait belt Activity Tolerance: Patient tolerated  treatment well Patient left: in bed;with call bell/phone within reach Nurse Communication: Mobility status PT Visit Diagnosis: Unsteadiness on feet (R26.81);Difficulty in walking, not elsewhere classified (R26.2);Muscle weakness (generalized) (M62.81);Pain Pain - Right/Left: Left Pain - part of body: Leg    Time: 1448-1500 PT Time Calculation (min) (ACUTE ONLY): 12 min   Charges:   PT Evaluation $PT Eval Moderate Complexity: 1 Mod          [employer], PT, GCS 10/07/20,3:22 PM

## 2020-10-07 NOTE — Progress Notes (Signed)
Orthopedic Tech Progress Note Patient Details:  Benjamin [name] 07-Mar-1960 [id number]  Called in order to [facility] for a SHRINKER   Patient ID: [name], male   DOB: 07/27/1960, 60 y.o.   MRN: [id number]  [name] 10/07/2020, 8:54 AM

## 2020-10-07 NOTE — Progress Notes (Signed)
Mobility Specialist: Progress Note   10/07/20 1420  Mobility  Activity Ambulated in hall  Level of Assistance Contact guard assist, steadying assist  Assistive Device Front wheel walker  Distance Ambulated (ft) 190 ft  Mobility Ambulated with assistance in hallway  Mobility Response Tolerated well  Mobility performed by Mobility specialist  $Mobility charge 1 Mobility   Pre-Mobility: 100 HR, 150/99 BP, 100% SpO2 Post-Mobility: 102 HR, 165/96 BP, 95% SpO2  Pt independent with bed mobility and contact guard to stand. Pt asx during ambulation. Pt back to bed after walk with call bell and phone at his side.   [facility] Benjamin [name] Mobility Specialist Mobility Specialist Phone: [phone]

## 2020-10-07 NOTE — Progress Notes (Addendum)
[facility] [place]  Subjective  - Tenderness to left groin   Objective (!) 141/96 94 98.3 F (36.8 C) (Oral) 15 98%  Intake/Output Summary (Last 24 hours) at 10/07/2020 0755 Last data filed at 10/07/2020 8185 Gross per 24 hour  Intake 680 ml  Output 2625 ml  Net -1945 ml    Left groin wet to dry dressing changed  Pale tissue with out surrounding skin compromise Left AKA stump incision healing well without ischemic changes, no erythema or drainage Right LE brisk DP/PT signals without ischemic changes to the right foot Lungs non labored breathing Heart RRR  Assessment/Planning: POD # 2 Left above-knee amputation                       #2: Removal of left femoral-popliteal bypass graft                       #3: Redo exposure of left femoral artery                       #4: Placement of antibiotic impregnated beads 2 Days Post-Op    Left groin without new granulation tissue, pale.  Wet to dry dressings BID Will consult [facility] for stump sock Continue IV antibiotics  Cultures NO GROWTH 2 DAYS NO ANAEROBES ISOLATED PT/OT consult placed today and CIR Urine OP >2600 cc last 24 hrs.  Cr stable WNL HGB 9.6 stable s/p transfusion 2 units PRBC 10/04/20 blood loss anemia   Benjamin [name] 10/07/2020 7:55 AM --  Laboratory Lab Results: Recent Labs    10/04/20 1255 10/05/20 0957  WBC  --  17.0*  HGB 8.8* 9.6*  HCT 26.0* 28.4*  PLT  --  518*   BMET Recent Labs    10/04/20 1255 10/05/20 0957  NA 134* 132*  K 4.3 3.8  CL  --  101  CO2  --  23  GLUCOSE  --  159*  BUN  --  10  CREATININE  --  0.63  CALCIUM  --  8.5*    COAG Lab Results  Component Value Date   INR 1.1 10/01/2020   INR 1.2 05/08/2020   INR 1.1 05/08/2020   No results found for: PTT

## 2020-10-08 DIAGNOSIS — G546 Phantom limb syndrome with pain: Secondary | ICD-10-CM

## 2020-10-08 LAB — TYPE AND SCREEN
ABO/RH(D): O POS
Antibody Screen: NEGATIVE
Unit division: 0
Unit division: 0
Unit division: 0
Unit division: 0
Unit division: 0
Unit division: 0

## 2020-10-08 LAB — BPAM RBC
Blood Product Expiration Date: 202209162359
Blood Product Expiration Date: 202209162359
Blood Product Expiration Date: 202209162359
Blood Product Expiration Date: 202209162359
Blood Product Expiration Date: 202209172359
Blood Product Expiration Date: 202209172359
ISSUE DATE / TIME: 202208141124
ISSUE DATE / TIME: 202208141124
ISSUE DATE / TIME: 202208141253
ISSUE DATE / TIME: 202208142257
Unit Type and Rh: 5100
Unit Type and Rh: 5100
Unit Type and Rh: 5100
Unit Type and Rh: 5100
Unit Type and Rh: 5100
Unit Type and Rh: 5100

## 2020-10-08 LAB — BASIC METABOLIC PANEL
Anion gap: 9 (ref 5–15)
BUN: 9 mg/dL (ref 8–23)
CO2: 24 mmol/L (ref 22–32)
Calcium: 8.7 mg/dL — ABNORMAL LOW (ref 8.9–10.3)
Chloride: 101 mmol/L (ref 98–111)
Creatinine, Ser: 0.68 mg/dL (ref 0.61–1.24)
GFR, Estimated: >60 mL/min (ref >60)
Glucose, Bld: 99 mg/dL (ref 70–99)
Potassium: 3.7 mmol/L (ref 3.5–5.1)
Sodium: 134 mmol/L — ABNORMAL LOW (ref 135–145)

## 2020-10-08 LAB — CBC
HCT: 33.4 % — ABNORMAL LOW (ref 39.0–52.0)
Hemoglobin: 11 g/dL — ABNORMAL LOW (ref 13.0–17.0)
MCH: 24.2 pg — ABNORMAL LOW (ref 26.0–34.0)
MCHC: 32.9 g/dL (ref 30.0–36.0)
MCV: 73.6 fL — ABNORMAL LOW (ref 80.0–100.0)
Platelets: 659 10*3/uL — ABNORMAL HIGH (ref 150–400)
RBC: 4.54 MIL/uL (ref 4.22–5.81)
RDW: 20.7 % — ABNORMAL HIGH (ref 11.5–15.5)
WBC: 12.4 10*3/uL — ABNORMAL HIGH (ref 4.0–10.5)
nRBC: 0 % (ref 0.0–0.2)

## 2020-10-08 MED ORDER — PIPERACILLIN-TAZOBACTAM 3.375 G IVPB
3.3750 g | Freq: Three times a day (TID) | INTRAVENOUS | Status: DC
  Administered 2020-10-08 – 2020-10-16 (×23): 3.375 g via INTRAVENOUS
  Filled 2020-10-08 (×23): qty 50

## 2020-10-08 MED ORDER — GABAPENTIN 400 MG PO CAPS
400.0000 mg | ORAL_CAPSULE | Freq: Two times a day (BID) | ORAL | Status: DC
  Administered 2020-10-08 – 2020-10-16 (×16): 400 mg via ORAL
  Filled 2020-10-08 (×16): qty 1

## 2020-10-08 NOTE — Progress Notes (Signed)
IP rehab admissions - Noted PT and OT recommending home health therapies.  Patient did well with his therapies.  Agree that patient will not need CIR admission and can likely DC home with [facility] once he is medically ready.  Call for questions.  [phone]

## 2020-10-08 NOTE — Progress Notes (Signed)
D/c IJ per order. Pt tolerated well.  ° °Seabron Iannello S Hennessy Bartel, RN ° °

## 2020-10-08 NOTE — Progress Notes (Addendum)
Progress Note    10/08/2020 8:12 AM 4 Days Post-Op  Subjective:  having some phantom pains   Vitals:   10/08/20 0320 10/08/20 0730  BP: (!) 159/97 (!) 148/101  Pulse: 76 94  Resp: 15 16  Temp: 98.1 F (36.7 C) 97.9 F (36.6 C)  SpO2: 96% 93%   Physical Exam: Cardiac:  regular Lungs:  non labored Incisions:  left groin incision with antibiotic beads in wound bed, tissue is pale. No new granulation tissue present. Wet to dry dressings changed Extremities:  left AKA well appearing. Staples intact. Flaps viable Abdomen:  flat non tender Neurologic: alert and oriented  CBC    Component Value Date/Time   WBC 17.0 (H) 10/05/2020 0957   RBC 3.90 (L) 10/05/2020 0957   HGB 9.6 (L) 10/05/2020 0957   HGB 12.2 (L) 07/22/2020 1150   HCT 28.4 (L) 10/05/2020 0957   HCT 37.4 (L) 07/22/2020 1150   PLT 518 (H) 10/05/2020 0957   PLT 419 07/22/2020 1150   MCV 72.8 (L) 10/05/2020 0957   MCV 76 (L) 07/22/2020 1150   MCH 24.6 (L) 10/05/2020 0957   MCHC 33.8 10/05/2020 0957   RDW 20.5 (H) 10/05/2020 0957   RDW 15.8 (H) 07/22/2020 1150   LYMPHSABS 0.7 10/01/2020 1803   MONOABS 0.9 10/01/2020 1803   EOSABS 0.0 10/01/2020 1803   BASOSABS 0.0 10/01/2020 1803    BMET    Component Value Date/Time   NA 132 (L) 10/05/2020 0957   NA 140 07/22/2020 1150   K 3.8 10/05/2020 0957   CL 101 10/05/2020 0957   CO2 23 10/05/2020 0957   GLUCOSE 159 (H) 10/05/2020 0957   BUN 10 10/05/2020 0957   BUN 14 07/22/2020 1150   CREATININE 0.63 10/05/2020 0957   CREATININE 1.02 01/20/2017 0908   CALCIUM 8.5 (L) 10/05/2020 0957   GFRNONAA >60 10/05/2020 0957   GFRAA >60 11/14/2019 0508    INR    Component Value Date/Time   INR 1.1 10/01/2020 2148     Intake/Output Summary (Last 24 hours) at 10/08/2020 5409 Last data filed at 10/08/2020 0753 Gross per 24 hour  Intake 1834.25 ml  Output 3225 ml  Net -1390.75 ml     Assessment/Plan:  60 y.o. male is s/p Left above-knee amputation                        #2: Removal of left femoral-popliteal bypass graft                       #3: Redo exposure of left femoral artery                       #4: Placement of antibiotic impregnated beads 4 Days Post-Op   Left AKA well appearing Stump sock to Left AKA Left groin still remains pale, no granulation tissue in wound bed Continue BID wet to dry dressings Cultures still with no growth. 3 Days no anaerobes isolated Continue IV Abx Hemodynamically stable Morning labs pending D/c a line PT/ OT recommending HH PT/OT  DVT prophylaxis: sq heparin  Graceann Congress, PA-C [facility] [phone] 10/08/2020 8:12 AM  I agree with the above.  I have seen and evaluated patient.  His stump is healing nicely.  He does have some phantom pain for which I will start him on Neurontin.  His groin will be slow to heal given the lack of blood  flow.  We will continue with wet-to-dry dressing changes.  I do not anticipate him being able to go home for several days.  We will continue his IV antibiotics.  [name]

## 2020-10-08 NOTE — Progress Notes (Signed)
Pharmacy Antibiotic Note  Benjamin [name] is a 60 y.o. male admitted on 10/01/2020 with cellulitis. Patient has a history of HTN, HLD, & PAD. Patient presented to vascular and vein specialist 8/11 for ischemic left leg with purulent drainage from the left groin.  History is notable for bilateral iliac artery stents on 10/15/2019. Patient later developed left leg ulcer and requiring bypass on 11/13/2019, which was complicated by re-thrombosis requiring further bypass on 05/08/2020.   Recent admission on 09/05/2020 secondary to occlusion of left femoropopliteal bypass graft with purulent drainage. Patient underwent I&D in the OR with excision of the distal graft and was discharged with a VAC and PICC and 6 weeks IV cefazolin.    Patient is currently on day 8 of antibiotics for left groin infection and is s/p AKA. Per vascular surgery, we will continue IV antibiotics for now, but can narrow from vancomycin and Zosyn to just Zosyn.   Plan: Continue Zosyn 3.375g q8h Monitor renal function and adjust dosing as indicated F/u cultures, plan for stop date, and other vascular recs   Height: 5\' 7"  (170.2 cm) Weight: 66.7 kg (147 lb) IBW/kg (Calculated) : 66.1  Temp (24hrs), Avg:98 F (36.7 C), Min:97.8 F (36.6 C), Max:98.4 F (36.9 C)  Recent Labs  Lab 10/01/20 1803 10/01/20 1848 10/01/20 2148 10/04/20 0215 10/05/20 0957 10/05/20 1127 10/05/20 1753 10/08/20 1215  WBC 14.6*  --  14.4* 14.5* 17.0*  --   --  12.4*  CREATININE 0.90  --  0.76 0.67 0.63  --   --  0.68  LATICACIDVEN  --  1.4  --   --   --   --   --   --   VANCOTROUGH  --   --   --   --   --   --  11*  --   VANCOPEAK  --   --   --   --   --  17*  --   --      Estimated Creatinine Clearance: 90.7 mL/min (by C-G formula based on SCr of 0.68 mg/dL).    Allergies  Allergen Reactions   Aspirin Nausea And Vomiting   Garlic Other (See Comments)    sneezing   Antimicrobials this admission: Vancomycin 8/11 >> 8/18 Zosyn 8/11 >>    Microbiology results: 8/11 BCx - NGTD 8/14 left groin, spec A - NGTD 8/14 fem bypass graft, spec B - NGTD  Thank you for allowing pharmacy to be a part of this patient's care.  9/14, PharmD Pharmacy Resident 10/08/2020, 2:44 PM

## 2020-10-08 NOTE — Progress Notes (Addendum)
Mobility Specialist: Progress Note   10/08/20 1431  Mobility  Activity Ambulated in hall  Level of Assistance Contact guard assist, steadying assist  Assistive Device Front wheel walker  Distance Ambulated (ft) 330 ft  Mobility Ambulated with assistance in hallway  Mobility Response Tolerated well  Mobility performed by Mobility specialist  $Mobility charge 1 Mobility   Pre-Mobility: 69 HR, 147/103 BP, 97% SpO2 Post-Mobility: 75 HR, 174/100 BP, 99% SpO2  Pt independent with bed mobility and required contact guard to stand as well as during ambulation. Pt required verbal cues for R foot placement as he was leading his R foot in front of RW. Pt back to the bed after walk with call bell at his side. Bed alarm is on. Pt did say his primary AD at home is RW.   [facility] Aeliana Spates Mobility Specialist Mobility Specialist Phone: [phone]

## 2020-10-08 NOTE — Progress Notes (Signed)
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  He presents to the hospital from the vascular clinic with a cold leg that is weak and numb.  Symptoms started yesterday, got worse this morning, not associated with fevers or difficulty breathing, arrives by paramedic transport with a note of a cold leg without pulses.  Vascular to be paged on arrival.  Symptoms are persistent, severe, not associated with any new trauma. Patient is S/P L AKA.    PT Comments    Patient received in recliner. He is agreeable to PT session. Reports mild pain in left LE. Patient stands from chair with supervision. He ambulated >200 feet with RW supervision and up/down 3 steps with B Rails with min guard. Patient demonstrates good safety and balance with RW and navigating steps. He will continue to benefit from skilled PT while here to improve safety with use of crutches if this is what he prefers to use.           Follow Up Recommendations  Home health PT     Equipment Recommendations  Wheelchair (measurements PT);Wheelchair cushion (measurements PT)    Recommendations for Other Services       Precautions / Restrictions Precautions Precautions: Fall Restrictions Weight Bearing Restrictions: Yes LLE Weight Bearing: Non weight bearing    Mobility  Bed Mobility               General bed mobility comments: patient up in recliner    Transfers Overall transfer level: Modified independent Equipment used: Rolling walker (2 wheeled) Transfers: Sit to/from Stand Sit to Stand: Supervision            Ambulation/Gait Ambulation/Gait assistance:  Supervision Gait Distance (Feet): 200 Feet   Gait Pattern/deviations: Step-to pattern Gait velocity: decr   General Gait Details: Patient demonstraes good balance, supervision only. Required seated rest due to fatigue   Stairs Stairs: Yes Stairs assistance: Supervision Stair Management: Two rails;Step to pattern;Forwards Number of Stairs: 3 General stair comments: Overall steady stair navigation.  Supervision for safety.   Wheelchair Mobility    Modified Rankin (Stroke Patients Only)       Balance Overall balance assessment: Modified Independent Sitting-balance support: Feet supported Sitting balance-Leahy Scale: Normal     Standing balance support: Bilateral upper extremity supported;During functional activity Standing balance-Leahy Scale: Good Standing balance comment: Very steady with RW.                            Cognition Arousal/Alertness: Awake/alert Behavior During Therapy: WFL for tasks assessed/performed Overall Cognitive Status: Within Functional Limits for tasks assessed                                 General Comments: patient with decreased safety awareness.      Exercises      General Comments        Pertinent Vitals/Pain Pain Assessment: Faces Faces Pain Scale: Hurts little more Pain Location: L leg Pain Descriptors / Indicators: Discomfort;Sore Pain Intervention(s): Monitored during session    Home Living  Prior Function            PT Goals (current goals can now be found in the care plan section) Acute Rehab PT Goals Patient Stated Goal: to go back home with sister PT Goal Formulation: With patient Time For Goal Achievement: 10/21/20 Potential to Achieve Goals: Good Progress towards PT goals: Progressing toward goals    Frequency    Min 3X/week      PT Plan Current plan remains appropriate    Co-evaluation              AM-PAC PT "6 Clicks" Mobility    Outcome Measure  Help needed turning from your back to your side while in a flat bed without using bedrails?: None Help needed moving from lying on your back to sitting on the side of a flat bed without using bedrails?: None Help needed moving to and from a bed to a chair (including a wheelchair)?: A Little Help needed standing up from a chair using your arms (e.g., wheelchair or bedside chair)?: None Help needed to walk in hospital room?: A Little Help needed climbing 3-5 steps with a railing? : A Little 6 Click Score: 21    End of Session Equipment Utilized During Treatment: Gait belt Activity Tolerance: Patient tolerated treatment well Patient left: in chair;with chair alarm set;with call bell/phone within reach Nurse Communication: Mobility status PT Visit Diagnosis: Unsteadiness on feet (R26.81);Difficulty in walking, not elsewhere classified (R26.2);Muscle weakness (generalized) (M62.81);Pain Pain - Right/Left: Left Pain - part of body: Leg     Time: 2111-5520 PT Time Calculation (min) (ACUTE ONLY): 18 min  Charges:  $Gait Training: 8-22 mins                     [employer], PT, GCS 10/08/20,11:54 AM

## 2020-10-09 ENCOUNTER — Telehealth: Payer: Self-pay | Admitting: Licensed Clinical Social Worker

## 2020-10-09 LAB — AEROBIC/ANAEROBIC CULTURE W GRAM STAIN (SURGICAL/DEEP WOUND)
Culture: NO GROWTH
Culture: NO GROWTH

## 2020-10-09 LAB — BASIC METABOLIC PANEL
Anion gap: 10 (ref 5–15)
BUN: 8 mg/dL (ref 8–23)
CO2: 23 mmol/L (ref 22–32)
Calcium: 9 mg/dL (ref 8.9–10.3)
Chloride: 101 mmol/L (ref 98–111)
Creatinine, Ser: 0.63 mg/dL (ref 0.61–1.24)
GFR, Estimated: >60 mL/min (ref >60)
Glucose, Bld: 104 mg/dL — ABNORMAL HIGH (ref 70–99)
Potassium: 3.9 mmol/L (ref 3.5–5.1)
Sodium: 134 mmol/L — ABNORMAL LOW (ref 135–145)

## 2020-10-09 LAB — CBC
HCT: 32.8 % — ABNORMAL LOW (ref 39.0–52.0)
Hemoglobin: 11.1 g/dL — ABNORMAL LOW (ref 13.0–17.0)
MCH: 24.4 pg — ABNORMAL LOW (ref 26.0–34.0)
MCHC: 33.8 g/dL (ref 30.0–36.0)
MCV: 72.2 fL — ABNORMAL LOW (ref 80.0–100.0)
Platelets: 636 10*3/uL — ABNORMAL HIGH (ref 150–400)
RBC: 4.54 MIL/uL (ref 4.22–5.81)
RDW: 20.5 % — ABNORMAL HIGH (ref 11.5–15.5)
WBC: 12.9 10*3/uL — ABNORMAL HIGH (ref 4.0–10.5)
nRBC: 0 % (ref 0.0–0.2)

## 2020-10-09 NOTE — Progress Notes (Signed)
Inpatient Rehab Admissions Coordinator Note:   Pt. Wishes to d/c home with home health which is what PT/OT are now recommending. CIR will sign off.   Megan Salon, MS, CCC-SLP Rehab Admissions Coordinator  [phone] (celll) [phone] (office)

## 2020-10-09 NOTE — Telephone Encounter (Signed)
LCSW received another call from pt sister [name], she has received the final bill needed for Patient Care Fund request. We will arrange a time for me to collect that on Monday for consideration for assistance. Pt sister inquiring if pt will go to rehab at discharge- I shared that since he is progressing well it appears that HHPT is recommended. Pt house has a ramp and is accessible. The only concern she has is that a wheelchair will not fit into the bathroom so he may need some assistance with getting into the bathroom via crutches/walker and could benefit from 3in1. I let pt sister know that I would reach out to my inpatient TOC teammates to let them know the above and that there may be some challenges due to Medicaid only status. I passed this information on to [place], RNCM with [facility] team.   [name], MSW, LCSW [facility] Heart/Vascular Care Navigation  [phone]

## 2020-10-09 NOTE — Telephone Encounter (Signed)
LCSW received call from [facility], pt sister [phone]). She shares pt doing well, she continues to call him daily to check in and other family go by to visit. She is aware he has been ambulating well with crutches and a walker. She shares she has gotten the bill from pt car insurance [employer]) but not yet from the car payment ([employer]). She is diligently working on those getting to her. I will coordinate getting those bills from her when both are received.   I then called pt at hospital [phone]). He sounds in good spirits, he has liked getting out of the bed and moving around to alleviate the pressure on his back and leg. He is pleased with the progress that he has made. He is not sure yet when he will be discharged but was updated about [facility] and I's collaboration for the car payment. I also encouraged him to speak with [facility] when he gets home about the long term plan for the car during his rest and recovery since it may be some while before he is driving a car again. Pt states understanding and will f/u with [facility] or I if needed for any outpatient needs. Pt and pt sister aware that inpatient [facility] team will handle needs for home arrangements for discharge.    [name], MSW, LCSW [facility] Heart/Vascular Care Navigation  [phone]

## 2020-10-09 NOTE — Progress Notes (Addendum)
Progress Note    10/09/2020 2:51 PM 5 Days Post-Op  Subjective:  sitting up in chair. left groin tenderness and complaining of left leg coldness   Vitals:   10/09/20 1021 10/09/20 1214  BP:  (!) 147/107  Pulse: 89 80  Resp:  15  Temp: 98.1 F (36.7 C) 97.9 F (36.6 C)  SpO2: 96% 97%   Physical Exam: Cardiac:  regular Lungs:  non labored Incisions:  left groin incision still without any granulation tissue, increased drainage and malodor today. Tissue is very pale  Extremities:  left AKA well appearing, healing nicely Abdomen:  flat, soft, non tender Neurologic: alert and oriented  CBC    Component Value Date/Time   WBC 12.9 (H) 10/09/2020 0500   RBC 4.54 10/09/2020 0500   HGB 11.1 (L) 10/09/2020 0500   HGB 12.2 (L) 07/22/2020 1150   HCT 32.8 (L) 10/09/2020 0500   HCT 37.4 (L) 07/22/2020 1150   PLT 636 (H) 10/09/2020 0500   PLT 419 07/22/2020 1150   MCV 72.2 (L) 10/09/2020 0500   MCV 76 (L) 07/22/2020 1150   MCH 24.4 (L) 10/09/2020 0500   MCHC 33.8 10/09/2020 0500   RDW 20.5 (H) 10/09/2020 0500   RDW 15.8 (H) 07/22/2020 1150   LYMPHSABS 0.7 10/01/2020 1803   MONOABS 0.9 10/01/2020 1803   EOSABS 0.0 10/01/2020 1803   BASOSABS 0.0 10/01/2020 1803    BMET    Component Value Date/Time   NA 134 (L) 10/09/2020 0500   NA 140 07/22/2020 1150   K 3.9 10/09/2020 0500   CL 101 10/09/2020 0500   CO2 23 10/09/2020 0500   GLUCOSE 104 (H) 10/09/2020 0500   BUN 8 10/09/2020 0500   BUN 14 07/22/2020 1150   CREATININE 0.63 10/09/2020 0500   CREATININE 1.02 01/20/2017 0908   CALCIUM 9.0 10/09/2020 0500   GFRNONAA >60 10/09/2020 0500   GFRAA >60 11/14/2019 0508    INR    Component Value Date/Time   INR 1.1 10/01/2020 2148     Intake/Output Summary (Last 24 hours) at 10/09/2020 1451 Last data filed at 10/09/2020 3810 Gross per 24 hour  Intake 230 ml  Output 2100 ml  Net -1870 ml     Assessment/Plan:  60 y.o. male is s/p s/p Left above-knee amputation                        #2: Removal of left femoral-popliteal bypass graft                       #3: Redo exposure of left femoral artery                       #4: Placement of antibiotic impregnated beads  5 Days Post-Op   Left AKA healing nicely Left groin slow to heal, pallor of tissue and increased drainage and malodor today Continue BID wet to dry dressing changes Cultures showed no growth  Remains on Zosyn Hemodynamically stable Orders placed for [facility] PT/OT  DVT prophylaxis:  sq. heparin   Graceann Congress, PA-C [facility] [phone] 10/09/2020 2:51 PM   I agree with the above.  I have seen and evaluated the patient.  His left above-knee amputation appears to be healing nicely.  He has marginal healing of the left groin incision.  We will continue wet-to-dry dressing changes.  If he continues to have significant drainage, I will consider a  wound VAC.  I did discuss with the patient that I am concerned about the left groin healing given his lack of blood flow.  Unfortunately he has exhausted all options for revascularization of the left leg.  The patient does not want to go to rehab but rather home with home health.  We will monitor his progress over the weekend with no plans for discharge until next week.  Social work is evaluating options for helping him with financial assistance through the patient care fund.  [name]

## 2020-10-10 NOTE — Progress Notes (Signed)
   VASCULAR SURGERY ASSESSMENT & PLAN:   POD 6  1: Left above-knee amputation 2: Removal of left femoral-popliteal bypass graft 3: Redo exposure of left femoral artery 4: Placement of antibiotic impregnated beads  Continue daily dressing changes.  ID: His intraoperative culture on 10/04/2020 showed no organisms.  There has been no growth from the culture.  He remains on intravenous Zosyn.   SUBJECTIVE:   No complaints this morning.  PHYSICAL EXAM:   Vitals:   10/09/20 1635 10/09/20 2001 10/10/20 0033 10/10/20 0500  BP: (!) 142/97 (!) 144/100 (!) 146/97 (!) 165/96  Pulse: 67 98 92 88  Resp: 16 19 20 18   Temp: 97.7 F (36.5 C) 97.6 F (36.4 C) 98 F (36.7 C) 97.6 F (36.4 C)  TempSrc: Oral Oral Oral Oral  SpO2: 99% 100% 100% 100%  Weight:      Height:        I changed his dressing in the left groin.  This is unchanged as documented below.    LABS:   Lab Results  Component Value Date   WBC 12.9 (H) 10/09/2020   HGB 11.1 (L) 10/09/2020   HCT 32.8 (L) 10/09/2020   MCV 72.2 (L) 10/09/2020   PLT 636 (H) 10/09/2020   Lab Results  Component Value Date   CREATININE 0.63 10/09/2020   Lab Results  Component Value Date   INR 1.1 10/01/2020    PROBLEM LIST:    Active Problems:   Arterial occlusion   Ischemia of lower extremity   CURRENT MEDS:    sodium chloride   Intravenous Once   atorvastatin  40 mg Oral Daily   buPROPion  150 mg Oral q morning   Chlorhexidine Gluconate Cloth  6 each Topical Daily   clopidogrel  75 mg Oral Daily   gabapentin  400 mg Oral BID   heparin  5,000 Units Subcutaneous Q8H   lactose free nutrition  237 mL Oral Daily   pantoprazole  40 mg Oral Daily   Vitamin D (Ergocalciferol)  50,000 Units Oral Once per day on Mon Thu    Josie Mesa Office: 10-12-1991 10/10/2020

## 2020-10-10 NOTE — Progress Notes (Signed)
Mobility Specialist: Progress Note   10/10/20 1520  Mobility  Activity Refused mobility   Attempted to see pt earlier today and pt refused d/t pain level 6/10 and requested pain medication. Pt has since received pain medication and said his pain was still high, no rating given. Will f/u as able.   [facility] Esli Jernigan Mobility Specialist Mobility Specialist Phone: [phone]

## 2020-10-11 NOTE — Progress Notes (Signed)
Mobility Specialist: Progress Note   10/11/20 1407  Mobility  Activity Ambulated in hall  Level of Assistance Contact guard assist, steadying assist  Assistive Device Front wheel walker  Distance Ambulated (ft) 230 ft  Mobility Ambulated with assistance in hallway  Mobility Response Tolerated well  Mobility performed by Mobility specialist  $Mobility charge 1 Mobility   Pre-Mobility: 96 HR, 146/87 BP, 100% SpO2 Post-Mobility: 90 HR, 154/95 BP  Pt c/o BUE fatigue during ambulation, otherwise asx througout. After returning to his room pt c/o 4/10 pain in his stump which is much improved from yesterday. Pt back to bed after walk with call bell and phone in reach.   [facility] Benjamin [name] Mobility Specialist Mobility Specialist Phone: [phone]

## 2020-10-11 NOTE — Progress Notes (Addendum)
  Progress Note    10/11/2020 9:23 AM 7 Days Post-Op  Subjective:  right groin sore   Vitals:   10/10/20 1845 10/11/20 0738  BP:  (!) 139/91  Pulse:  97  Resp: 18 18  Temp:  97.8 F (36.6 C)  SpO2:  96%   Physical Exam: Cardiac:  regular Lungs:  non labored Incisions:  right groin incision remains unchanged. No granulation tissue. Serous drainage. Wet to dry dressings reapplied  Extremities:  well perfused Abdomen:  flat, soft, non tender Neurologic: alert and oriented  CBC    Component Value Date/Time   WBC 12.9 (H) 10/09/2020 0500   RBC 4.54 10/09/2020 0500   HGB 11.1 (L) 10/09/2020 0500   HGB 12.2 (L) 07/22/2020 1150   HCT 32.8 (L) 10/09/2020 0500   HCT 37.4 (L) 07/22/2020 1150   PLT 636 (H) 10/09/2020 0500   PLT 419 07/22/2020 1150   MCV 72.2 (L) 10/09/2020 0500   MCV 76 (L) 07/22/2020 1150   MCH 24.4 (L) 10/09/2020 0500   MCHC 33.8 10/09/2020 0500   RDW 20.5 (H) 10/09/2020 0500   RDW 15.8 (H) 07/22/2020 1150   LYMPHSABS 0.7 10/01/2020 1803   MONOABS 0.9 10/01/2020 1803   EOSABS 0.0 10/01/2020 1803   BASOSABS 0.0 10/01/2020 1803    BMET    Component Value Date/Time   NA 134 (L) 10/09/2020 0500   NA 140 07/22/2020 1150   K 3.9 10/09/2020 0500   CL 101 10/09/2020 0500   CO2 23 10/09/2020 0500   GLUCOSE 104 (H) 10/09/2020 0500   BUN 8 10/09/2020 0500   BUN 14 07/22/2020 1150   CREATININE 0.63 10/09/2020 0500   CREATININE 1.02 01/20/2017 0908   CALCIUM 9.0 10/09/2020 0500   GFRNONAA >60 10/09/2020 0500   GFRAA >60 11/14/2019 0508    INR    Component Value Date/Time   INR 1.1 10/01/2020 2148     Intake/Output Summary (Last 24 hours) at 10/11/2020 7824 Last data filed at 10/10/2020 2158 Gross per 24 hour  Intake 240 ml  Output 1600 ml  Net -1360 ml     Assessment/Plan:  60 y.o. male is s/p 1: Left above-knee amputation 2: Removal of left femoral-popliteal bypass graft 3: Redo exposure of left femoral artery 4: Placement of antibiotic  impregnated beads   7 Days Post-Op   Right groin remains unchanged. Not healing well. L AKA healing nicely Continue BID wet to dry dressing changes No growth from cultures Hemodynamically stable Afebrile TOC assisting with discharge planning. HH PT and DME needs  DVT prophylaxis:  sq heparin   Graceann Congress, PA-C [facility] [phone] 10/11/2020 9:23 AM  I have interviewed the patient and examined the patient. I agree with the findings by the PA.  The wound is unchanged.  Continue wound care.  Cari Caraway, MD

## 2020-10-12 ENCOUNTER — Encounter (HOSPITAL_COMMUNITY): Payer: Medicaid Other

## 2020-10-12 ENCOUNTER — Ambulatory Visit: Payer: Self-pay | Admitting: Surgery

## 2020-10-12 ENCOUNTER — Encounter (HOSPITAL_COMMUNITY): Payer: Self-pay

## 2020-10-12 ENCOUNTER — Other Ambulatory Visit (HOSPITAL_COMMUNITY): Payer: Self-pay

## 2020-10-12 ENCOUNTER — Telehealth: Payer: Self-pay | Admitting: Licensed Clinical Social Worker

## 2020-10-12 NOTE — Progress Notes (Signed)
Mobility Specialist: Progress Note   10/12/20 1742  Mobility  Activity Ambulated in hall  Level of Assistance Modified independent, requires aide device or extra time  Assistive Device Front wheel walker  Distance Ambulated (ft) 290 ft  Mobility Ambulated with assistance in hallway  Mobility Response Tolerated well  Mobility performed by Mobility specialist  $Mobility charge 1 Mobility   Pre-Mobility: 78 HR, 155/101 BP, 99% SpO2 During Mobility: 101 HR Post-Mobility: 75 HR, 145/85 BP, 100% SpO2  Pt asx during ambulation. Pt back to bed after walk per request with call bell at his side. Pt is set-up with his dinner.   [facility] Benjamin [name] Mobility Specialist Mobility Specialist Phone: [phone]

## 2020-10-12 NOTE — Progress Notes (Signed)
Patient stated he has lost his cell phone.  Patient stated the last time he saw his cell phone was prior to going to surgery on 10/04/2020.   RN looked through patient belonging bags and around patient room, no cell phone present.  RN went to look through bedside drawers and patient stated "I've looked through them twice, it is not in there".  RN asked patient if I could look through the drawers and patient stated that he has already looked through them and it is not there so no need for RN to look in drawers.   Called Pre-op holding, OR, and PACU, they did not have any unclaimed black flip phones.

## 2020-10-12 NOTE — Progress Notes (Signed)
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  He presents to the hospital from the vascular clinic with a cold leg that is weak and numb.  Symptoms started yesterday, got worse this morning, not associated with fevers or difficulty breathing, arrives by paramedic transport with a note of a cold leg without pulses.  Vascular to be paged on arrival.  Symptoms are persistent, severe, not associated with any new trauma. Patient is S/P L AKA.   OT comments  Pt progressing well towards OT goals. Pt with good safety awareness, problem solving of compensatory techniques and motivated to return to independence. Pt overall min guard for bimanual ADL tasks standing at sink and hallway mobility without LOB noted. Discussed DME needs at home with recs updated to include [facility] for placement over toilet at home.    Follow Up Recommendations  Home health OT;Supervision - Intermittent    Equipment Recommendations  3 in 1 bedside commode;Tub/shower bench;Other (comment) (Rolling walker)    Recommendations for Other Services      Precautions / Restrictions Precautions Precautions: Fall Restrictions Weight Bearing Restrictions: Yes LLE Weight Bearing: Non weight bearing       Mobility Bed Mobility               General bed mobility comments: up in chair    Transfers Overall transfer level: Modified independent Equipment used: Rolling walker (2 wheeled) Transfers: Sit to/from Stand Sit to Stand: Modified independent (Device/Increase time)         General transfer comment: no assist needed    Balance Overall balance assessment: Needs  assistance Sitting-balance support: Feet supported Sitting balance-Leahy Scale: Normal     Standing balance support: Bilateral upper extremity supported;During functional activity Standing balance-Leahy Scale: Fair Standing balance comment: able to stand statically without UE support, use of BUE support needed for mobility due to L AKA                           ADL either performed or assessed with clinical judgement   ADL Overall ADL's : Needs assistance/impaired     Grooming: Min guard;Standing;Oral care Grooming Details (indicate cue type and reason): though no overt LOB, good problem solving of balance techniques             Lower Body Dressing: Set up;Sitting/lateral leans Lower Body Dressing Details (indicate cue type and reason): to don L sock sitting in chair.             Functional mobility during ADLs: Min guard;Rolling walker General ADL Comments: Pt progressing well with good problem solving of compensatory strategies, balance, and safety. Motivated to complete hallway mobility with RW - good UB strength. Discussed placement of BSC over toilet to ease transfer abilities, tub transfer strategies     Vision   Vision Assessment?: No apparent visual deficits   Perception     Praxis      Cognition Arousal/Alertness: Awake/alert Behavior During Therapy: WFL for tasks assessed/performed Overall Cognitive Status: Within Functional Limits for tasks assessed  Exercises     Shoulder Instructions       General Comments VSS on RA. Encouraged continued skin checks to ensure optimal healing at home    Pertinent Vitals/ Pain       Pain Assessment: 0-10 Pain Score: 4  Pain Location: L groin Pain Descriptors / Indicators: Discomfort Pain Intervention(s): Monitored during session  Home Living                                          Prior Functioning/Environment               Frequency  Min 2X/week        Progress Toward Goals  OT Goals(current goals can now be found in the care plan section)  Progress towards OT goals: Progressing toward goals  Acute Rehab OT Goals Patient Stated Goal: to go back home with sister OT Goal Formulation: With patient Time For Goal Achievement: 10/21/20 Potential to Achieve Goals: Good ADL Goals Pt Will Perform Grooming: with modified independence;standing Pt Will Perform Lower Body Bathing: with modified independence;with adaptive equipment;sitting/lateral leans;sit to/from stand Pt Will Perform Lower Body Dressing: with modified independence;sitting/lateral leans;sit to/from stand Pt Will Transfer to Toilet: with modified independence;ambulating Pt Will Perform Toileting - Clothing Manipulation and hygiene: with modified independence;sitting/lateral leans;sit to/from stand  Plan Discharge plan remains appropriate    Co-evaluation                 AM-PAC OT "6 Clicks" Daily Activity     Outcome Measure   Help from another person eating meals?: None Help from another person taking care of personal grooming?: A Little Help from another person toileting, which includes using toliet, bedpan, or urinal?: A Little Help from another person bathing (including washing, rinsing, drying)?: A Little Help from another person to put on and taking off regular upper body clothing?: None Help from another person to put on and taking off regular lower body clothing?: A Little 6 Click Score: 20    End of Session Equipment Utilized During Treatment: Gait belt;Rolling walker  OT Visit Diagnosis: Unsteadiness on feet (R26.81);Other abnormalities of gait and mobility (R26.89);Muscle weakness (generalized) (M62.81);Pain Pain - Right/Left: Left Pain - part of body:  (groin)   Activity Tolerance Patient tolerated treatment well   Patient Left in chair;with call bell/phone within reach;with chair alarm set   Nurse  Communication Mobility status        Time: 9562-1308 OT Time Calculation (min): 28 min  Charges: OT General Charges $OT Visit: 1 Visit OT Treatments $Self Care/Home Management : 8-22 mins $Therapeutic Activity: 8-22 mins  Bradd Canary, OTR/L Acute Rehab Services Office: [phone]    Lorre Munroe 10/12/2020, 10:24 AM

## 2020-10-12 NOTE — Progress Notes (Signed)
Physical Therapy Treatment Patient Details Name: Benjamin [name] MRN: [id number] DOB: 1960/03/23 Today's Date: 10/12/2020    History of Present Illness Pt is a 60 year old male who presents to the hospital from the vascular clinic with a cold leg that is weak and numb.  Symptoms started yesterday, got worse this morning, not associated with fevers or difficulty breathing, arrives by paramedic transport with a note of a cold leg without pulses.  Vascular to be paged on arrival.  Symptoms are persistent, severe, not associated with any new trauma. Patient is S/P L AKA.  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    PT Comments    Pt received in supine, agreeable to therapy session and with good participation in gait training. Pt also instructed on seated/supine therapeutic exercises and HEP handout given (link below). Emphasis on likely progression of therapies, typical healing times for AKA, safety with transfers/gait with RW, use of call bell and DME recommendations. Pt with good tolerance for hallway gait trial, plan to continue stair training next session. Pt continues to benefit from PT services to progress toward functional mobility goals.    Follow Up Recommendations  Home health PT     Equipment Recommendations  Wheelchair (measurements PT);Wheelchair cushion (measurements PT)    Recommendations for Other Services       Precautions / Restrictions Precautions Precautions: Fall Restrictions Weight Bearing Restrictions: Yes LLE Weight Bearing: Non weight bearing    Mobility  Bed Mobility Overal bed mobility: Independent     Transfers Overall transfer level: Modified independent Equipment used: Rolling walker (2 wheeled) Transfers: Sit to/from Stand Sit to Stand: Modified independent (Device/Increase time)         General transfer comment: no assist needed from EOB or chair, cues for  stand>sit safety only  Ambulation/Gait Ambulation/Gait assistance: Supervision Gait Distance (Feet): 230 Feet Assistive device: Rolling walker (2 wheeled) Gait Pattern/deviations: Step-to pattern     General Gait Details: Patient demonstrates good balance, supervision only. No seated breaks needed and HR/SpO2 WNL on RA         Balance Overall balance assessment: Needs assistance Sitting-balance support: Feet supported Sitting balance-Leahy Scale: Normal     Standing balance support: Bilateral upper extremity supported;During functional activity Standing balance-Leahy Scale: Fair Standing balance comment: able to stand statically without UE support, use of BUE support needed for mobility due to L AKA                            Cognition Arousal/Alertness: Awake/alert Behavior During Therapy: WFL for tasks assessed/performed Overall Cognitive Status: Within Functional Limits for tasks assessed                  Exercises Other Exercises Other Exercises: seated BLE AROM: hip abduction, hip extension, hip flexion x5-10 reps ea    General Comments General comments (skin integrity, edema, etc.): HR to 105 bpm with exertion, SpO2 WNL on RA; HEP given: [email or web address] Access Code: [email or web address]      Pertinent Vitals/Pain Pain Assessment: 0-10 Pain Score: 4  Pain Location: L groin and residual limb Pain Descriptors / Indicators: Discomfort;Guarding Pain Intervention(s): Monitored during session;Repositioned;Patient requesting pain meds-RN notified (pt requesting pain meds at end of session, RN notified)     PT Goals (current goals can now be found in the care plan section) Acute Rehab PT Goals Patient Stated Goal: to  go back home with sister PT Goal Formulation: With patient Time For Goal Achievement: 10/21/20 Progress towards PT goals: Progressing toward goals    Frequency    Min 3X/week      PT Plan Current plan remains appropriate        AM-PAC PT "6 Clicks" Mobility   Outcome Measure  Help needed turning from your back to your side while in a flat bed without using bedrails?: None Help needed moving from lying on your back to sitting on the side of a flat bed without using bedrails?: None Help needed moving to and from a bed to a chair (including a wheelchair)?: A Little Help needed standing up from a chair using your arms (e.g., wheelchair or bedside chair)?: None Help needed to walk in hospital room?: A Little Help needed climbing 3-5 steps with a railing? : A Little 6 Click Score: 21    End of Session Equipment Utilized During Treatment: Gait belt Activity Tolerance: Patient tolerated treatment well Patient left: in chair;with chair alarm set;with call bell/phone within reach Nurse Communication: Mobility status;Patient requests pain meds PT Visit Diagnosis: Unsteadiness on feet (R26.81);Difficulty in walking, not elsewhere classified (R26.2);Muscle weakness (generalized) (M62.81);Pain Pain - Right/Left: Left Pain - part of body: Leg     Time: 1520-1550 PT Time Calculation (min) (ACUTE ONLY): 30 min  Charges:  $Gait Training: 23-37 mins                     Chantea Surace P., PTA Acute Rehabilitation Services Pager: [phone] Office: [phone]    Angus Palms 10/12/2020, 4:34 PM

## 2020-10-12 NOTE — Progress Notes (Signed)
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 Dressing was changed in the left groin.  The tissue still remains dusky.       Assessment/Plan:  POD #8  Cultures remain negative Right groin is unchanged.  I will speak with plastic surgery to see if there is anything we can do from a wound healing perspective.  I will consider placing a wound VAC.  Benjamin [name] 10/12/2020 5:48 PM --  Vitals:   10/12/20 1630 10/12/20 1721  BP: (!) 143/92 (!) 152/92  Pulse: 66 87  Resp:  20  Temp: 97.7 F (36.5 C) (!) 97.4 F (36.3 C)  SpO2: 100% 99%    Intake/Output Summary (Last 24 hours) at 10/12/2020 1748 Last data filed at 10/12/2020 1414 Gross per 24 hour  Intake 840 ml  Output 600 ml  Net 240 ml     Laboratory CBC    Component Value Date/Time   WBC 12.9 (H) 10/09/2020 0500   HGB 11.1 (L) 10/09/2020 0500   HGB 12.2 (L) 07/22/2020 1150   HCT 32.8 (L) 10/09/2020 0500   HCT 37.4 (L) 07/22/2020 1150   PLT 636 (H) 10/09/2020 0500   PLT 419 07/22/2020 1150    BMET    Component Value Date/Time   NA 134 (L) 10/09/2020 0500   NA 140 07/22/2020 1150   K 3.9 10/09/2020 0500   CL 101 10/09/2020 0500   CO2 23 10/09/2020 0500   GLUCOSE 104 (H) 10/09/2020 0500   BUN 8 10/09/2020 0500   BUN 14 07/22/2020 1150   CREATININE 0.63 10/09/2020 0500   CREATININE 1.02 01/20/2017 0908   CALCIUM 9.0 10/09/2020 0500   GFRNONAA >60 10/09/2020 0500   GFRAA >60 11/14/2019 0508    COAG Lab Results  Component Value Date   INR 1.1 10/01/2020   INR 1.2 05/08/2020   INR 1.1 05/08/2020   No results found for: PTT  Antibiotics Anti-infectives (From admission, onward)    Start     Dose/Rate Route Frequency Ordered Stop   10/08/20 2000  piperacillin-tazobactam (ZOSYN) IVPB 3.375 g        3.375 g 12.5 mL/hr over 240 Minutes Intravenous Every 8  hours 10/08/20 1424     10/04/20 1249  vancomycin (VANCOCIN) powder  Status:  Discontinued          As needed 10/04/20 1249 10/04/20 1319   10/04/20 1249  gentamicin (GARAMYCIN) injection  Status:  Discontinued          As needed 10/04/20 1253 10/04/20 1319   10/02/20 0600  vancomycin (VANCOREADY) IVPB 750 mg/150 mL  Status:  Discontinued        750 mg 150 mL/hr over 60 Minutes Intravenous Every 12 hours 10/01/20 2238 10/08/20 1424   10/02/20 0400  piperacillin-tazobactam (ZOSYN) IVPB 3.375 g  Status:  Discontinued        3.375 g 12.5 mL/hr over 240 Minutes Intravenous Every 8 hours 10/01/20 2151 10/08/20 1424   10/01/20 2200  piperacillin-tazobactam (ZOSYN) IVPB 3.375 g  Status:  Discontinued        3.375 g 12.5 mL/hr over 240 Minutes Intravenous Every 8 hours 10/01/20 2147 10/01/20 2151   10/01/20 1815  vancomycin (VANCOREADY) IVPB 1250 mg/250 mL        1,250 mg 166.7 mL/hr over 90 Minutes Intravenous  Once 10/01/20 1807  10/01/20 2047   10/01/20 1815  piperacillin-tazobactam (ZOSYN) IVPB 3.375 g        3.375 g 100 mL/hr over 30 Minutes Intravenous  Once 10/01/20 1807 10/01/20 1925        V. Charlena Cross, M.D., [facility] [place] Office: [phone] Pager:  [phone]

## 2020-10-12 NOTE — Telephone Encounter (Signed)
LCSW was able to collect the following bills from pt sister [name] to submit to our patient care fund: [employer] and [employer] (Scientist, water quality) car insurance from [employer]. I have submitted to Jefm Miles and team lead for Patient Care Fund consideration.   [name], MSW, LCSW [facility] Heart/Vascular Care Navigation  [phone]

## 2020-10-13 NOTE — Progress Notes (Signed)
Mobility Specialist: Progress Note    10/13/20 1455  Mobility  Activity Ambulated in hall  Level of Assistance Standby assist, set-up cues, supervision of patient - no hands on  Assistive Device Front wheel walker  Distance Ambulated (ft) 290 ft  Mobility Ambulated with assistance in hallway  Mobility Response Tolerated well  Mobility performed by Mobility specialist  $Mobility charge 1 Mobility    Pre-Mobility: 93 HR, 143/102 BP, 97% SpO2 Post-Mobility: 90 HR, 162/99 BP  Pt was independent STS. Ambulated in the hall with 7/10 pain but asx otherwise. Pt returned to bed with call bell and phone in lap.   [facility] Amie Cowens Mobility Specialist Mobility Specialist Phone: [phone]

## 2020-10-13 NOTE — TOC Initial Note (Addendum)
Transition of Care [facility]) - Initial/Assessment Note    Patient Details  Name: Benjamin [name] MRN: [id number] Date of Birth: [birth year]  [facility] CM/SW Contact:    Lawerance Sabal, RN Phone Number: 10/13/2020, 2:27 PM  Clinical Narrative:         Patient from home, was DC'd in July and had IV Abx w [facility] RN from [employer] set up by Fredrich Birks that did his VAC change once a week, then he went to the office once a week for VAC change.   Unclear at this time if patient will need IV Abx at DC. If he does not, TOC will need to find a HH agency to accept, barrier will be [employer] (limited availability) and payor source, Medicaid.   Patient has home VAC in closet in hospital room. It is uncharged and his charger is at home. I have left a message w his sister [facility] requesting callback for her to bring it in. He will also need a canister.  Per outpatient CSW from HF clinic, does not receive disability payments yet therefore I do not think that SNFs would accept him with no check involved.   Will anticipate DC to home with need for [facility] RN, will continue to follow to see if he will need IV Abx and can continue to use [facility].      [facility] Sister [phone]  [phone]  Cleotis Nipper Niece   [phone]             10/14/20 08:47 Spoke w [name], her and Juliette Alcide will drop off his power cord, canister and supplies for his home wound VAC today.  Expected Discharge Plan: Home w Home Health Services Barriers to Discharge: Financial Resources, Inadequate or no insurance   Patient Goals and CMS Choice        Expected Discharge Plan and Services Expected Discharge Plan: Home w Home Health Services   Discharge Planning Services: CM Consult   Living arrangements for the past 2 months: Single Family Home                                      Prior Living Arrangements/Services Living arrangements for the past 2 months: Single Family Home                      Activities of Daily Living Home Assistive Devices/Equipment: Crutches ADL Screening (condition at time of admission) Patient's cognitive ability adequate to safely complete daily activities?: Yes Is the patient deaf or have difficulty hearing?: No Does the patient have difficulty seeing, even when wearing glasses/contacts?: No Does the patient have difficulty concentrating, remembering, or making decisions?: No Patient able to express need for assistance with ADLs?: Yes Does the patient have difficulty dressing or bathing?: No Independently performs ADLs?: Yes (appropriate for developmental age) Does the patient have difficulty walking or climbing stairs?: No Weakness of Legs: Left Weakness of Arms/Hands: None  Permission Sought/Granted                  Emotional Assessment              Admission diagnosis:  Arterial occlusion [I70.90] Ischemia of lower extremity [I99.8] Patient Active Problem List   Diagnosis Date Noted   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/2020   Ischemia of lower extremity 10/01/2020   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   Femoral-popliteal bypass graft occlusion,  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   PAD (peripheral artery disease) (HCC) 11/13/2019   Callus of foot 07/01/2019   Intermittent claudication (HCC) 01/20/2017   Need for influenza vaccination 01/20/2017   Needs smoking cessation education 09/06/2016   Abnormal echocardiogram 09/05/2016   Abnormal EKG 07/25/2016   Preoperative cardiovascular examination 07/25/2016   Shortness of breath on exertion 07/25/2016   Dyslipidemia, goal LDL below 70 07/25/2016   Uncontrolled hypertension 07/25/2016   Abnormal chest x-ray 07/04/2016   Peripheral vascular disease (HCC) 07/04/2016   Decreased pedal pulses 06/24/2016   Varicose vein of leg 06/24/2016   Heavy smoker 06/24/2016   Abnormal weight loss 06/24/2016   Foot pain, left 06/24/2016   Screening for prostate cancer 06/24/2016   PCP:  Arnette Felts,  FNP Pharmacy:   [id number] [place], [place] - [address] AT [facility] OF [address] & [address] [place] [address] Phone: [phone] Fax: [phone]  [facility] - [place], [place] - [address] [place] [address] Phone: [phone] Fax: [phone]  [facility] [address] [place] [address] Phone: [phone] Fax: [phone]     Social Determinants of Health (SDOH) Interventions    Readmission Risk Interventions No flowsheet data found.

## 2020-10-13 NOTE — Consult Note (Addendum)
WOC Nurse Consult Note: Reason for Consult: Vascular team following for assessment and plan of care for left groin dehisced wound. Requested to apply Vac to the location. Wound type: Left inner groin with full thickness wound; 1.5X9X.8cm Wound bed: 50% yellow, 50% red Drainage (amount, consistency, odor) small amt yellow drainage, no odor or fluctuance Periwound: intact skin surrounding Dressing procedure/placement/frequency: Pt medicated for pain and tolerated with minimal amt discomfort.  Applied barrier ring to wound edges to attempt to maintain a seal, then one piece black foam to cont suction.  WOC team will plan to change again on Fri, then resume M/W/F schedule next week if the patient is still in the hospital at that time.  Attn: Case manager: Pt has a portable Vac machine from home in the closet of the hospital room, so he will not need another Vac machine ordered for use after discharge.  However, he does not have a cannister or charger and states there is no one that can bring these in from home to have machine ready when he is discharged.  He will need to have the device hooked up by home health nurses after discharge.  Cammie Mcgee MSN, RN, CWOCN, [place], CNS [phone]

## 2020-10-13 NOTE — Progress Notes (Signed)
   VASCULAR SURGERY ASSESSMENT & PLAN:   PAD: Admitted secondary to  Ischemic left foot and leg; S/p left AKA, removal of left femoral-popliteal bypass graft, POD 9.  Poor healing left groin: consideration given to [facility] dressing and/or plastic surgery consultation.  Left AKA site healing. VSS. Remains afebrile. Mild leukocytosis.Remains on Zosyn. Wound cultures negative.  Plavix and statin continue.  SUBJECTIVE:   Mild incisional pain.  PHYSICAL EXAM:   Vitals:   10/12/20 1721 10/12/20 2005 10/12/20 2312 10/13/20 0358  BP: (!) 152/92 (!) 151/89 (!) 159/91 (!) 148/101  Pulse: 87     Resp: 20 17 15 17   Temp: (!) 97.4 F (36.3 C) 97.8 F (36.6 C) 98 F (36.7 C) 98.2 F (36.8 C)  TempSrc: Oral Oral Oral Oral  SpO2: 99% 98%  97%  Weight:      Height:       General appearance: Awake, alert in no apparent distress Cardiac: Heart rate and rhythm are regular Respirations: Nonlabored Incisions: Right groin incision: fibrinous exudate, no purulent drainage. Right AKA incision well approximated; flaps warm and well perfused.      LABS:   Lab Results  Component Value Date   WBC 12.9 (H) 10/09/2020   HGB 11.1 (L) 10/09/2020   HCT 32.8 (L) 10/09/2020   MCV 72.2 (L) 10/09/2020   PLT 636 (H) 10/09/2020   Lab Results  Component Value Date   CREATININE 0.63 10/09/2020   Lab Results  Component Value Date   INR 1.1 10/01/2020   CBG (last 3)  No results for input(s): GLUCAP in the last 72 hours.  PROBLEM LIST:    Active Problems:   Arterial occlusion   Ischemia of lower extremity   CURRENT MEDS:    sodium chloride   Intravenous Once   atorvastatin  40 mg Oral Daily   buPROPion  150 mg Oral q morning   Chlorhexidine Gluconate Cloth  6 each Topical Daily   clopidogrel  75 mg Oral Daily   gabapentin  400 mg Oral BID   heparin  5,000 Units Subcutaneous Q8H   lactose free nutrition  237 mL Oral Daily   pantoprazole  40 mg Oral Daily   Vitamin D (Ergocalciferol)   50,000 Units Oral Once per day on Mon Thu    04-09-1989, Milinda Antis  Office: [phone] 10/13/2020

## 2020-10-13 NOTE — Progress Notes (Signed)
Physical Therapy Treatment Patient Details Name: Benjamin [name] MRN: [id number] DOB: 28-Jun-1960 Today's Date: 10/13/2020    History of Present Illness Pt is a 60 year old male who presents to the hospital from the vascular clinic with a cold leg that is weak and numb.  Symptoms started yesterday, got worse this morning, not associated with fevers or difficulty breathing, arrives by paramedic transport with a note of a cold leg without pulses.  Vascular to be paged on arrival.  Symptoms are persistent, severe, not associated with any new trauma. Patient is S/P L AKA.  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    PT Comments    Pt supine in bed on entry, reporting that he has 7/10 groin pain that increases with dependent position. Pt reports he was just given pain medication, however it hasn't started to work yet. Pt agreeable to transferring to chair for exercise but declines ambulation. Pt is independent for bed mobility, and mod I for squat pivot transfer to chair. WOC RN in room at end of session to apply wound vac to improve groin wound healing. D/c plan remains appropriate. PT will continue to follow acutely.     Follow Up Recommendations  Home health PT     Equipment Recommendations  Wheelchair (measurements PT);Wheelchair cushion (measurements PT)       Precautions / Restrictions Precautions Precautions: Fall Restrictions Weight Bearing Restrictions: Yes LLE Weight Bearing: Non weight bearing    Mobility  Bed Mobility Overal bed mobility: Independent                  Transfers Overall transfer level: Modified independent   Transfers: Squat Pivot Transfers Sit to Stand: Modified independent (Device/Increase time)         General transfer comment: no assist needed from EOB to chair  Ambulation/Gait             General Gait Details: declined due to pain in groin           Balance Overall balance assessment: Needs assistance Sitting-balance support: Feet supported Sitting balance-Leahy Scale: Normal     Standing balance support: Bilateral upper extremity supported;During functional activity Standing balance-Leahy Scale: Fair Standing balance comment: able to stand statically without UE support, use of BUE support needed for mobility due to L AKA                            Cognition Arousal/Alertness: Awake/alert Behavior During Therapy: WFL for tasks assessed/performed Overall Cognitive Status: Within Functional Limits for tasks assessed                                        Exercises Other Exercises Other Exercises: seated BLE AROM: hip abduction, hip extension, hip flexion x5-10 reps ea    General Comments General comments (skin integrity, edema, etc.): VSS on RA      Pertinent Vitals/Pain Pain Assessment: 0-10 Pain Score: 7  Pain Location: L groin and residual limb Pain Descriptors / Indicators: Discomfort;Guarding Pain Intervention(s): Limited activity within patient's tolerance;Monitored during session;Premedicated before session;Repositioned     PT Goals (current goals can now be found in the care plan section) Acute Rehab PT Goals Patient Stated Goal: to go back home with sister PT Goal Formulation: With patient Time For  Goal Achievement: 10/21/20 Progress towards PT goals: Not progressing toward goals - comment (limited by pain)    Frequency    Min 3X/week      PT Plan Current plan remains appropriate       AM-PAC PT "6 Clicks" Mobility   Outcome Measure  Help needed turning from your back to your side while in a flat bed without using bedrails?: None Help needed moving from lying on your back to sitting on the side of a flat bed without using bedrails?: None Help needed moving to and from a bed to a chair (including a wheelchair)?: A Little Help needed standing up from a chair using  your arms (e.g., wheelchair or bedside chair)?: None Help needed to walk in hospital room?: A Little Help needed climbing 3-5 steps with a railing? : A Little 6 Click Score: 21    End of Session Equipment Utilized During Treatment: Gait belt Activity Tolerance: Patient tolerated treatment well Patient left: in chair;with chair alarm set;with call bell/phone within reach Nurse Communication: Mobility status;Patient requests pain meds PT Visit Diagnosis: Unsteadiness on feet (R26.81);Difficulty in walking, not elsewhere classified (R26.2);Muscle weakness (generalized) (M62.81);Pain Pain - Right/Left: Left Pain - part of body: Leg     Time: 7654-6503 PT Time Calculation (min) (ACUTE ONLY): 17 min  Charges:  $Therapeutic Exercise: 8-22 mins                     Benjamin [name] B. Beverely Risen PT, DPT Acute Rehabilitation Services Pager [phone] Office [phone]    Benjamin [name] 10/13/2020, 11:20 AM

## 2020-10-14 NOTE — Progress Notes (Signed)
Mobility Specialist: Progress Note    10/14/20 1204  Mobility  Activity Ambulated in hall  Level of Assistance Modified independent, requires aide device or extra time  Assistive Device Front wheel walker  Distance Ambulated (ft) 510 ft  Mobility Sit up in bed/chair position for meals  Mobility Response Tolerated well  Mobility performed by Mobility specialist  Bed Position Chair  $Mobility charge 1 Mobility    Pre-Mobility: 86 HR, 146/89 BP, 98% SpO2 During Mobility: 95 HR, 99% SpO2 Post-Mobility: 90 HR, 135/106 BP, 100% SpO2  Pt c/o 8/10 pain but agreeable to mobility. Pt independent STS w/ RW. Pt needed minimal cueing on foot placement and posture while amb in hall. Pt returned to chair w/ call bell by side and breakfast in front. Pt did request a tylenol for L limb, feels as if it was swollen. Notified his nurse.    [facility] Makylie Rivere Mobility Specialist Mobility Specialist Phone: [phone]

## 2020-10-14 NOTE — Progress Notes (Addendum)
   VASCULAR SURGERY ASSESSMENT & PLAN:   PAD: Admitted secondary to  Ischemic left foot and leg; S/p left AKA, removal of left femoral-popliteal bypass graft, POD 10.  Poor healing left groin: VAC dressing in place.   Left AKA site healing. VSS. Remains afebrile. Remains on Zosyn. Wound cultures negative.   Plavix and statin continue.  Dispo: Need to ascertain HH needs: VAC changes (home VAC pump in room), ? ome on IV abx. He has no payor source for SNF.  SUBJECTIVE:   Minimal left groin pain.  PHYSICAL EXAM:   Vitals:   10/13/20 2009 10/13/20 2100 10/13/20 2126 10/14/20 0451  BP: 130/88   (!) 140/95  Pulse: 85   81  Resp: 20 20  15   Temp: 97.8 F (36.6 C)   97.9 F (36.6 C)  TempSrc: Oral   Oral  SpO2: 94%  94% 97%  Weight:      Height:       General appearance: Awake, alert in no apparent distress Cardiac: Heart rate and rhythm are regular Respirations: Nonlabored Incisions: Left groin incision: VAC in place with good seal. Left AKA incision well approximated; flaps warm and well perfused.   LABS:   Lab Results  Component Value Date   WBC 12.9 (H) 10/09/2020   HGB 11.1 (L) 10/09/2020   HCT 32.8 (L) 10/09/2020   MCV 72.2 (L) 10/09/2020   PLT 636 (H) 10/09/2020   Lab Results  Component Value Date   CREATININE 0.63 10/09/2020   Lab Results  Component Value Date   INR 1.1 10/01/2020   CBG (last 3)  No results for input(s): GLUCAP in the last 72 hours.  PROBLEM LIST:    Active Problems:   Arterial occlusion   Ischemia of lower extremity   CURRENT MEDS:    sodium chloride   Intravenous Once   atorvastatin  40 mg Oral Daily   buPROPion  150 mg Oral q morning   Chlorhexidine Gluconate Cloth  6 each Topical Daily   clopidogrel  75 mg Oral Daily   gabapentin  400 mg Oral BID   heparin  5,000 Units Subcutaneous Q8H   lactose free nutrition  237 mL Oral Daily   pantoprazole  40 mg Oral Daily   Vitamin D (Ergocalciferol)  50,000 Units Oral Once per day  on Mon Thu   04-09-1989  Office: [phone] 10/14/2020    I agree with the above.  I have seen and evaluated the patient.  His wound VAC is functioning nicely.  He will need to continue on IV antibiotics while in the hospital and then be transition to oral antibiotics for 6 weeks.  He now has concerns about being able to go home to his sister's house.  He is asking questions about a nursing facility.  We will need to explore this with his case manager.  10/16/2020

## 2020-10-14 NOTE — Progress Notes (Signed)
Mobility Specialist: Progress Note    10/14/20 1324  Mobility  Activity Transferred:  Chair to bed  Level of Assistance Standby assist, set-up cues, supervision of patient - no hands on  Assistive Device None  Mobility Out of bed to chair with meals  Mobility Response Tolerated well  Mobility performed by Mobility specialist  $Mobility charge 1 Mobility   Pt assisted back to bed per request. Pt left in bed with call bell and phone in lap.    [facility] Jakiya Bookbinder Mobility Specialist Mobility Specialist Phone: [phone]

## 2020-10-14 NOTE — Progress Notes (Signed)
Mobility Specialist: Progress Note    10/14/20 1619  Mobility  Activity Ambulated to bathroom;Ambulated in hall  Level of Assistance Modified independent, requires aide device or extra time  Assistive Device Front wheel walker  Distance Ambulated (ft) 375 ft  Mobility Ambulated with assistance in room;Ambulated with assistance in hallway  Mobility Response Tolerated well  Mobility performed by Mobility specialist  $Mobility charge 1 Mobility   During Mobility: 95 HR, 87% SpO2 Post-Mobility: 87 HR, 142/97 BP, 97% SpO2  Pt independent with STS. Pt to BR and then agreeable to ambulate. Pt c/o 6/10 pain in LLE and SOB requiring one short standing break. Pt back to bed with call bell by his side.   [facility] Tykesha Konicki Mobility Specialist Mobility Specialist Phone: [phone]

## 2020-10-15 NOTE — TOC Progression Note (Addendum)
Transition of Care (TOC) - Progression Note  Donn Pierini RN, BSN Transitions of Care Unit 4E- RN Case Manager See Treatment Team for direct phone #    Patient Details  Name: Benjamin [name] MRN: [id number] Date of Birth: 04/10/1960  Transition of Care [facility]) CM/SW Contact  Zenda Alpers, Lenn Sink, RN Phone Number: 10/15/2020, 3:58 PM  Clinical Narrative:    Per vascular note today plan for pt to transition to oral abx at discharge- have spoken with Judie Petit. [place] and confirmed this plan. Pt will not need iv abx on discharge. Pt will need continued home wound VAC- as pt will no longer need IV abx- will need New HH agency to assist with home wound VAC needs- PTA pt was going to vascular office once/week for wound VAC drsg change and had HH changing drsg once/week- vascular still agreeable to doing a VAC change in the office once/week if needed.  TOC will reach out to [facility] agencies to see if one can be secured- CM spoke with pt at bedside regarding transition needs. Per pt he voiced concern about returning home without prothesis- he states he and his sister were hoping that he would go to rehab and then return home "walking" with his prothesis- explained to pt that getting his prothesis was a process and did not happen so quickly on discharge. Also discussed that based on therapy notes he did not need rehab at this time. Pt voiced that he can return to his sister's home. Pt has crutches and a walker at home, however is requesting a wheelchair for home- will ask MD for DME order for w/c.  Also discussed with pt HH needs for home wound VAC and that we would have to find a new agency as he would no longer need IV abx on discharge- pt voiced understanding and states he does not have a preference- home wound VAC at bedside being charged ([facility]). Explored option with pt to continue to go to vascular office once/week- pt voiced that he would have transportation to do this if needed.   Call made to [facility] with  [facility] regarding [facility] referral needs- Misty Stanley to speak with director about pt needs and see if they can accept.- awaiting return call.  1615- update- Iantha Fallen has agreed to accept pt with a start of care date for next week either Mon or Tues. They will do once/week VAC drsg change with the understanding that Vascular will do the second drsg change in the office weekly. If pt is medically stable for d/c in the next 24-48 hrs would need wound VAC drsg change in house prior to discharge. TOC will follow up in the am.    Expected Discharge Plan: Home w Home Health Services Barriers to Discharge: Financial Resources, Inadequate or no insurance  Expected Discharge Plan and Services Expected Discharge Plan: Home w Home Health Services   Discharge Planning Services: CM Consult   Living arrangements for the past 2 months: Single Family Home                                       Social Determinants of Health (SDOH) Interventions    Readmission Risk Interventions No flowsheet data found.

## 2020-10-15 NOTE — Progress Notes (Signed)
Occupational Therapy Treatment Patient Details Name: Benjamin [name] MRN: [id number] DOB: 1960/12/10 Today's Date: 10/15/2020    History of present illness Pt is a 60 year old male who presents to the hospital from the vascular clinic with a cold leg that is weak and numb.  Symptoms started yesterday, got worse this morning, not associated with fevers or difficulty breathing, arrives by paramedic transport with a note of a cold leg without pulses.  Vascular to be paged on arrival.  Symptoms are persistent, severe, not associated with any new trauma. Patient is S/P L AKA.  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   OT comments  Pt progressing well towards OT goals. Pt received in bathroom and able to complete remainder of toileting task with Modified Independence. Pt able to demo mobility using crutches at supervision level and reports feeling more comfortable with this AD. Guided pt in various ADLs seated at sink, reinforced energy conservation and safety techniques with ADLs in this manner at home.  Pt reports concerns over medications (pt unsure if he will have to complete injection meds at home) and meal mgmt. Educated on DME use, safety and compensatory strategies for meals at home with pt verbalizing understanding. Meals on Wheels may also be helpful initially at home. Rec HH vs OP OT pending transportation abilities.   Follow Up Recommendations  Home health OT;Supervision - Intermittent (vs OP OT pending transportation availability)    Equipment Recommendations  3 in 1 bedside commode;Tub/shower bench;Other (comment) (Rolling walker)    Recommendations for Other Services      Precautions / Restrictions Precautions Precautions: Fall Precaution Comments: groin wound vac Restrictions Weight Bearing Restrictions: Yes LLE Weight Bearing: Non weight bearing       Mobility Bed Mobility                General bed mobility comments: received in bathroom    Transfers Overall transfer level: Modified independent Equipment used: Crutches Transfers: Sit to/from Stand Sit to Stand: Modified independent (Device/Increase time)         General transfer comment: from reg toilet and bedside    Balance Overall balance assessment: Needs assistance Sitting-balance support: Feet supported Sitting balance-Leahy Scale: Normal     Standing balance support: Bilateral upper extremity supported;During functional activity Standing balance-Leahy Scale: Fair Standing balance comment: able to stand statically without UE support, use of BUE support needed for mobility due to L AKA                           ADL either performed or assessed with clinical judgement   ADL Overall ADL's : Needs assistance/impaired Eating/Feeding: Independent;Sitting   Grooming: Modified independent;Sitting;Wash/dry face;Wash/dry hands;Oral care Grooming Details (indicate cue type and reason): pt motivated to shave this AM, performed tasks seated at sink for energy conservation and safety                 Toilet Transfer: Supervision/safety;Ambulation;Regular Toilet;Grab bars Toilet Transfer Details (indicate cue type and reason): received in bathroom, pt used crutches to exit bathroom - reports feeling better with crutches use Toileting- Clothing Manipulation and Hygiene: Supervision/safety Toileting - Clothing Manipulation Details (indicate cue type and reason): very distance supervision     Functional mobility during ADLs: Supervision/safety (crutches) General ADL Comments: Reinforced safety with ADLs at home, performing tasks seated as needed to decrease fall risk. Pt with concerns  about meals and medications at home when inquired about SNF discussions. Per pt, concerns about possible med injections at home (educated may be something transitioned to by mouth or education from RN - further info  needed). Pt also with concerns about making meals. Educated on use of RW and bags to carry food or performing tasks seated or from wheelchair level.     Vision   Vision Assessment?: No apparent visual deficits   Perception     Praxis      Cognition Arousal/Alertness: Awake/alert Behavior During Therapy: WFL for tasks assessed/performed Overall Cognitive Status: Within Functional Limits for tasks assessed                                 General Comments: minor decreased safety awareness, wants to mobilize independently though requires assist for IV/wound vac lines        Exercises     Shoulder Instructions       General Comments      Pertinent Vitals/ Pain       Pain Assessment: Faces Faces Pain Scale: Hurts a little bit Pain Location: L groin Pain Descriptors / Indicators: Discomfort;Guarding Pain Intervention(s): Monitored during session  Home Living Family/patient expects to be discharged to:: Private residence Living Arrangements: Other relatives Available Help at Discharge: Family;Available 24 hours/day                                    Prior Functioning/Environment              Frequency  Min 2X/week        Progress Toward Goals  OT Goals(current goals can now be found in the care plan section)  Progress towards OT goals: Progressing toward goals  Acute Rehab OT Goals Patient Stated Goal: to go back home with sister OT Goal Formulation: With patient Time For Goal Achievement: 10/21/20 Potential to Achieve Goals: Good ADL Goals Pt Will Perform Grooming: with modified independence;standing Pt Will Perform Lower Body Bathing: with modified independence;with adaptive equipment;sitting/lateral leans;sit to/from stand Pt Will Perform Lower Body Dressing: with modified independence;sitting/lateral leans;sit to/from stand Pt Will Transfer to Toilet: with modified independence;ambulating Pt Will Perform Toileting -  Clothing Manipulation and hygiene: with modified independence;sitting/lateral leans;sit to/from stand  Plan Discharge plan needs to be updated    Co-evaluation                 AM-PAC OT "6 Clicks" Daily Activity     Outcome Measure   Help from another person eating meals?: None Help from another person taking care of personal grooming?: None Help from another person toileting, which includes using toliet, bedpan, or urinal?: None Help from another person bathing (including washing, rinsing, drying)?: A Little Help from another person to put on and taking off regular upper body clothing?: None Help from another person to put on and taking off regular lower body clothing?: A Little 6 Click Score: 22    End of Session Equipment Utilized During Treatment: Other (comment) (crutches)  OT Visit Diagnosis: Unsteadiness on feet (R26.81);Other abnormalities of gait and mobility (R26.89);Muscle weakness (generalized) (M62.81);Pain Pain - Right/Left: Left Pain - part of body: Leg   Activity Tolerance Patient tolerated treatment well   Patient Left Other (comment);with nursing/sitter in room (seated at sink with NT)   Nurse Communication Mobility status  Time: 7564-3329 OT Time Calculation (min): 26 min  Charges: OT General Charges $OT Visit: 1 Visit OT Treatments $Self Care/Home Management : 23-37 mins  Bradd Canary, OTR/L Acute Rehab Services Office: [phone]    Lorre Munroe 10/15/2020, 9:49 AM

## 2020-10-15 NOTE — Progress Notes (Addendum)
  Progress Note    10/15/2020 7:53 AM 11 Days Post-Op  Subjective:  no complaints   Vitals:   10/15/20 0000 10/15/20 0353  BP:  (!) 143/92  Pulse:  73  Resp:  20  Temp:  98 F (36.7 C)  SpO2: 94% 100%   Physical Exam: Lungs:  non labored Incisions:  L AKA incision c/d/I; L groin incision with vac in place with good seal Neurologic: A&O  CBC    Component Value Date/Time   WBC 12.9 (H) 10/09/2020 0500   RBC 4.54 10/09/2020 0500   HGB 11.1 (L) 10/09/2020 0500   HGB 12.2 (L) 07/22/2020 1150   HCT 32.8 (L) 10/09/2020 0500   HCT 37.4 (L) 07/22/2020 1150   PLT 636 (H) 10/09/2020 0500   PLT 419 07/22/2020 1150   MCV 72.2 (L) 10/09/2020 0500   MCV 76 (L) 07/22/2020 1150   MCH 24.4 (L) 10/09/2020 0500   MCHC 33.8 10/09/2020 0500   RDW 20.5 (H) 10/09/2020 0500   RDW 15.8 (H) 07/22/2020 1150   LYMPHSABS 0.7 10/01/2020 1803   MONOABS 0.9 10/01/2020 1803   EOSABS 0.0 10/01/2020 1803   BASOSABS 0.0 10/01/2020 1803    BMET    Component Value Date/Time   NA 134 (L) 10/09/2020 0500   NA 140 07/22/2020 1150   K 3.9 10/09/2020 0500   CL 101 10/09/2020 0500   CO2 23 10/09/2020 0500   GLUCOSE 104 (H) 10/09/2020 0500   BUN 8 10/09/2020 0500   BUN 14 07/22/2020 1150   CREATININE 0.63 10/09/2020 0500   CREATININE 1.02 01/20/2017 0908   CALCIUM 9.0 10/09/2020 0500   GFRNONAA >60 10/09/2020 0500   GFRAA >60 11/14/2019 0508    INR    Component Value Date/Time   INR 1.1 10/01/2020 2148     Intake/Output Summary (Last 24 hours) at 10/15/2020 0753 Last data filed at 10/15/2020 0459 Gross per 24 hour  Intake 480 ml  Output 2500 ml  Net -2020 ml     Assessment/Plan:  60 y.o. male is s/p L AKA and removal of graft; vac placed L groin 11 Days Post-Op   -L AKA healing well -Wound vac L groin with minimal output; change MWF; patient already has home vac - Plan is to transition to p.o. antibiotics at discharge - TOC to re-evaluate for possible  placement   ADDENDUM: Plan is for d/c home tomorrow after wound vac change in the morning.  He will be transitioned to p.o. antibiotics.  DME wheel chair ordered per patient request.  He will have 2 vac changes per week.  One will be [facility] RN [facility] starting Monday 8/29, and another in VVS office on Thursday or Friday.   Emilie Rutter, PA-C [facility] [phone] 10/15/2020 7:53 AM

## 2020-10-15 NOTE — Progress Notes (Signed)
Physical Therapy Treatment Patient Details Name: Benjamin [name] MRN: [id number] DOB: 1960-12-01 Today's Date: 10/15/2020    History of Present Illness Pt is a 60 year old male who presents to the hospital from the vascular clinic with a cold leg that is weak and numb.  Symptoms started yesterday, got worse this morning, not associated with fevers or difficulty breathing, arrives by paramedic transport with a note of a cold leg without pulses.  Vascular to be paged on arrival.  Symptoms are persistent, severe, not associated with any new trauma. Patient is S/P L AKA.  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    PT Comments    Pt received in supine, agreeable to therapy session and with good participation and tolerance for gait and stair training. Pt performed stair trial with L rail and folded RW on R side, plan to assess stair trial with wall rail and crutches next date. Pt able to perform gait/stairs with up to min guard (mostly modI to Supervision aside from stair trial) and reports he has a ramp but it is long so he is more likely to use steps to get into house. Pt also reports he has questions re: driving and how to get transportation to his appointments, RN/case management notified he may need info on community services available through his insurance/charity for transportation to PT/MD appointments and to go to grocery store, etc. Pt continues to benefit from PT services to progress toward functional mobility goals.    Follow Up Recommendations  Home health PT;Other (comment) (vs OPPT if trasportation available (pt reports he may not be able to get a ride, may need info on services to help him get to appointments).)     Equipment Recommendations  Wheelchair (measurements PT);Wheelchair cushion (measurements PT)    Recommendations for Other Services       Precautions / Restrictions  Precautions Precautions: Fall Precaution Comments: groin wound vac Restrictions Weight Bearing Restrictions: Yes LLE Weight Bearing: Non weight bearing    Mobility  Bed Mobility Overal bed mobility: Independent                  Transfers Overall transfer level: Modified independent Equipment used: Rolling walker (2 wheeled) Transfers: Sit to/from Stand Sit to Stand: Modified independent (Device/Increase time)         General transfer comment: from reg toilet and EOB  Ambulation/Gait Ambulation/Gait assistance: Supervision Gait Distance (Feet): 250 Feet Assistive device: Rolling walker (2 wheeled) Gait Pattern/deviations: Step-to pattern Gait velocity: decr Gait velocity interpretation: 1.31 - 2.62 ft/sec, indicative of limited community ambulator General Gait Details: cues for safety/activity pacing, x2 standing breaks (at stairwell and at nursing station) to rest, good use of RW and no LOB   Stairs Stairs: Yes Stairs assistance: Min guard Stair Management: One rail Left;Step to pattern;Forwards;With walker (folded RW) Number of Stairs: 2 General stair comments: pt ascended/descended 2 steps with L rail and PTA assisting to stabilize folded RW. Pt also reports he has crutches so verbal review and handout given to reinforce technique with RW and with crutches. Plan to practice with crutches next session.   Wheelchair Mobility    Modified Rankin (Stroke Patients Only)       Balance Overall balance assessment: Needs assistance Sitting-balance support: Feet supported Sitting balance-Leahy Scale: Normal     Standing balance support: Bilateral upper extremity supported;During functional activity Standing balance-Leahy Scale: Fair Standing balance comment: able  to stand statically without UE support, use of BUE support needed for mobility due to L AKA                            Cognition Arousal/Alertness: Awake/alert Behavior During Therapy:  WFL for tasks assessed/performed Overall Cognitive Status: Within Functional Limits for tasks assessed                                 General Comments: minor decreased safety awareness, wants to mobilize independently though requires assist for IV/wound vac lines      Exercises      General Comments General comments (skin integrity, edema, etc.): VSS on RA      Pertinent Vitals/Pain Pain Assessment: Faces Faces Pain Scale: Hurts a little bit Pain Location: L groin Pain Descriptors / Indicators: Discomfort;Guarding Pain Intervention(s): Limited activity within patient's tolerance;Monitored during session;Repositioned    Home Living                      Prior Function            PT Goals (current goals can now be found in the care plan section) Acute Rehab PT Goals Patient Stated Goal: to go back home with sister PT Goal Formulation: With patient Time For Goal Achievement: 10/21/20 Potential to Achieve Goals: Good Progress towards PT goals: Progressing toward goals    Frequency    Min 3X/week      PT Plan Current plan remains appropriate    Co-evaluation              AM-PAC PT "6 Clicks" Mobility   Outcome Measure  Help needed turning from your back to your side while in a flat bed without using bedrails?: None Help needed moving from lying on your back to sitting on the side of a flat bed without using bedrails?: None Help needed moving to and from a bed to a chair (including a wheelchair)?: None Help needed standing up from a chair using your arms (e.g., wheelchair or bedside chair)?: None Help needed to walk in hospital room?: A Little Help needed climbing 3-5 steps with a railing? : A Little 6 Click Score: 22    End of Session Equipment Utilized During Treatment: Gait belt Activity Tolerance: Patient tolerated treatment well Patient left: in bed;with call bell/phone within reach Nurse Communication: Mobility status;Other  (comment) (pt has questions about his phone and transportation once discharged) PT Visit Diagnosis: Unsteadiness on feet (R26.81);Difficulty in walking, not elsewhere classified (R26.2);Muscle weakness (generalized) (M62.81);Pain Pain - Right/Left: Left Pain - part of body: Leg     Time: 9476-5465 PT Time Calculation (min) (ACUTE ONLY): 26 min  Charges:  $Gait Training: 23-37 mins                     Tayden Nichelson P., PTA Acute Rehabilitation Services Pager: [phone] Office: [phone]    Angus Palms 10/15/2020, 5:40 PM

## 2020-10-15 NOTE — Progress Notes (Signed)
OT Cancellation Note  Patient Details Name: Benjamin [name] MRN: [id number] DOB: Jan 15, 1961   Cancelled Treatment:    Reason Eval/Treat Not Completed: Other (comment) Pt eating breakfast on initial OT attempt. Will follow-up for ADL session as schedule permits  Lorre Munroe 10/15/2020, 7:54 AM

## 2020-10-16 ENCOUNTER — Other Ambulatory Visit (HOSPITAL_COMMUNITY): Payer: Self-pay

## 2020-10-16 ENCOUNTER — Telehealth: Payer: Self-pay | Admitting: Licensed Clinical Social Worker

## 2020-10-16 MED ORDER — OXYCODONE-ACETAMINOPHEN 5-325 MG PO TABS: 1.0000 | ORAL_TABLET | Freq: Four times a day (QID) | ORAL | 0 refills | Status: DC | PRN

## 2020-10-16 MED ORDER — GABAPENTIN 400 MG PO CAPS: 400.0000 mg | ORAL_CAPSULE | Freq: Two times a day (BID) | ORAL | 5 refills | Status: DC

## 2020-10-16 MED ORDER — SULFAMETHOXAZOLE-TRIMETHOPRIM 800-160 MG PO TABS: 1.0000 | ORAL_TABLET | Freq: Two times a day (BID) | ORAL | 1 refills | Status: DC

## 2020-10-16 MED ORDER — OXYCODONE-ACETAMINOPHEN 5-325 MG PO TABS
1.0000 | ORAL_TABLET | Freq: Four times a day (QID) | ORAL | 0 refills | Status: DC | PRN
  Filled 2020-10-16: qty 30, 7d supply, fill #0

## 2020-10-16 MED ORDER — GABAPENTIN 400 MG PO CAPS
400.0000 mg | ORAL_CAPSULE | Freq: Two times a day (BID) | ORAL | 0 refills | Status: DC
  Filled 2020-10-16: qty 60, 30d supply, fill #0

## 2020-10-16 MED ORDER — SULFAMETHOXAZOLE-TRIMETHOPRIM 800-160 MG PO TABS: 1.0000 | ORAL_TABLET | Freq: Two times a day (BID) | ORAL | 0 refills | Status: DC

## 2020-10-16 MED ORDER — SULFAMETHOXAZOLE-TRIMETHOPRIM 800-160 MG PO TABS
1.0000 | ORAL_TABLET | Freq: Two times a day (BID) | ORAL | 0 refills | Status: DC
  Filled 2020-10-16: qty 84, 42d supply, fill #0

## 2020-10-16 NOTE — Progress Notes (Signed)
[facility] IP rehab admissions - patient known to me from consult earlier in admission.  I have reviewed notes and I have discussed this case with Dr. Riley Kill.  The patient is doing too well now and does not need or require an acute inpatient rehab admission.  Recommend home with [facility] therapies.  Patient is already at supervision level for most activity and likely will not qualify for a SNF stay.  I have explained to patient that home with [facility] therapies is likely the option remaining for this patient.  Call me for questions.  [phone]

## 2020-10-16 NOTE — Progress Notes (Cosign Needed)
   Durable Medical Equipment (From admission, onward)        Start     Ordered  10/15/20 1632  For home use only DME lightweight manual wheelchair with seat cushion  Once      Comments: Patient suffers from leg amputation which impairs their ability to perform daily activities like bathing, dressing, feeding, grooming, and toileting in the home.  A walker will not resolve  issue with performing activities of daily living. A wheelchair will allow patient to safely perform daily activities. Patient is not able to propel themselves in the home using a standard weight wheelchair due to arm weakness, endurance, and general weakness. Patient can self propel in the lightweight wheelchair. Length of need 12 months . Accessories: elevating leg rests (ELRs), wheel locks, extensions and anti-tippers.  10/15/20 1633

## 2020-10-16 NOTE — Telephone Encounter (Signed)
Spoke with Kristi, RNCM, plan for d/c today. She has spoken with pt and pt sister. Pt now stating that he cannot go home b/c he does not have money to purchase food. Per my previous discussions w/ pt he had shared that he receives SNAP monthly for food and his sister is also able to assist with food needs. However, if this is an issue both [name] and pt have been encouraged to call me. I am aware that a referral was made to [facility] [address] for ongoing PT services. I will f/u on this if has not been scheduled in 1-2 weeks. Pt also has been accepted for [facility] services through [facility] and I am able to connect pt to appropriate help if any concerns around these services arise.   Pt has been ordered a wheelchair , crutches and 3n1 which he can use over toilet and in shower. If other DME needs arise we can assist as able to order. Pt has been referred to appropriate Transportation Resources and further information including [place] application was mailed to home address. I will f/u when back in the office Monday for any scheduled vascular appointments and ensure pt has rides for any upcoming vac changes. Again, appreciate coordination of TOC team to ensure pt has what he needs for discharge.   [name], MSW, LCSW [facility] Health Heart/Vascular Care Navigation  [phone]

## 2020-10-16 NOTE — Progress Notes (Signed)
Physical Therapy Treatment Patient Details Name: Benjamin [name] MRN: [id number] DOB: 03/04/1960 Today's Date: 10/16/2020    History of Present Illness Pt is a 60 year old male who presents to the hospital from the vascular clinic with a cold leg that is weak and numb.  Symptoms started yesterday, got worse this morning, not associated with fevers or difficulty breathing, arrives by paramedic transport with a note of a cold leg without pulses.  Vascular to be paged on arrival.  Symptoms are persistent, severe, not associated with any new trauma. Patient is S/P L AKA.  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    PT Comments    Pt received in supine, eager to participate in wheelchair mobility training and for instruction on safe use of W/C. Pt with mildly decreased safety awareness with IV/wound vac line but receptive to all instruction on safety with w/c management and how to propel chair and able to return demo. Discussed crutches with pt, they are improper size (they are for 83ft 10" person and he is 19ft 7") and case mgmt notified he needs a regular size for safety and injury prevention. Deferred stair trial with crutches until proper size can be obtained/fitted for him. Pt continues to benefit from PT services to progress toward functional mobility goals.   Follow Up Recommendations  Home health PT;Other (comment) (vs OPPT if trasportation available (pt reports he may not be able to get a ride, may need info on services to help him get to appointments).)     Equipment Recommendations  Wheelchair (measurements PT);Wheelchair cushion (measurements PT);Crutches;Other (comment) (the crutches he has in room are improper height and unsafe to use (they were his brother's and his brother was taller than him and they are also damaged))    Recommendations for Other Services       Precautions / Restrictions  Precautions Precautions: Fall Precaution Comments: groin wound vac, PICC line RUE Restrictions Weight Bearing Restrictions: Yes LLE Weight Bearing: Non weight bearing    Mobility  Bed Mobility Overal bed mobility: Needs Assistance Bed Mobility: Supine to Sit;Sit to Supine     Supine to sit: Supervision Sit to supine: Supervision   General bed mobility comments: cues needed due to decreased awareness of IV/wound vac lines prior to standing    Transfers Overall transfer level: Needs assistance Equipment used: None Transfers: Squat Pivot Transfers     Squat pivot transfers: Supervision     General transfer comment: cues for brakes/positioning of w/c and assist needed to manage IV/wound vac lines as pt nearly sat on PICC line IV line              Wheelchair Mobility Wheelchair Mobility Wheelchair mobility: Yes Wheelchair propulsion: Both upper extremities;Right lower extremity Wheelchair parts: Supervision/cueing (initially needed assist to manage but progressed to Supervision/cues) Distance: 150 Wheelchair Assistance Details (indicate cue type and reason): verbal cues for technique for forward mobility and turns (wide/tight turns) as well as use of brakes and leg rest adjustment/removal, and energy conservation techniques. pt with fair carryover of instruction and able to return demo brakes/leg rest removal and placement x2 reps  Modified Rankin (Stroke Patients Only)       Balance Overall balance assessment: Needs assistance Sitting-balance support: Feet supported Sitting balance-Leahy Scale: Normal     Standing balance support: Bilateral upper extremity supported;During functional activity Standing balance-Leahy Scale: Fair Standing balance comment: fair control with squat pivot  transfer        Cognition Arousal/Alertness: Awake/alert Behavior During Therapy: WFL for tasks assessed/performed Overall Cognitive Status: Within Functional Limits for tasks  assessed             General Comments: minor decreased safety awareness, wants to mobilize independently though requires assist for IV/wound vac lines         General Comments General comments (skin integrity, edema, etc.): VSS on RA      Pertinent Vitals/Pain Pain Assessment: Faces Faces Pain Scale: Hurts a little bit Pain Location: L groin/residual limb Pain Descriptors / Indicators: Discomfort;Guarding Pain Intervention(s): Monitored during session;Repositioned           PT Goals (current goals can now be found in the care plan section) Acute Rehab PT Goals Patient Stated Goal: to go back home with sister (who has MS) PT Goal Formulation: With patient Time For Goal Achievement: 10/21/20 Potential to Achieve Goals: Good Progress towards PT goals: Progressing toward goals    Frequency    Min 3X/week      PT Plan Current plan remains appropriate       AM-PAC PT "6 Clicks" Mobility   Outcome Measure  Help needed turning from your back to your side while in a flat bed without using bedrails?: None Help needed moving from lying on your back to sitting on the side of a flat bed without using bedrails?: None Help needed moving to and from a bed to a chair (including a wheelchair)?: A Little Help needed standing up from a chair using your arms (e.g., wheelchair or bedside chair)?: A Little Help needed to walk in hospital room?: A Little Help needed climbing 3-5 steps with a railing? : A Little 6 Click Score: 20    End of Session Equipment Utilized During Treatment: Other (comment) (wound vac) Activity Tolerance: Patient tolerated treatment well Patient left: in bed;with call bell/phone within reach Nurse Communication: Mobility status;Other (comment) (pt has questions about discharge today or tomorrow and will need crutches, case mgr also aware.) PT Visit Diagnosis: Unsteadiness on feet (R26.81);Difficulty in walking, not elsewhere classified (R26.2);Muscle  weakness (generalized) (M62.81);Pain Pain - Right/Left: Left Pain - part of body: Leg     Time: 1240-1311 PT Time Calculation (min) (ACUTE ONLY): 31 min  Charges:  $Therapeutic Activity: 8-22 mins $Wheel Chair Management: 8-22 mins                     Akif Weldy P., PTA Acute Rehabilitation Services Pager: [phone] Office: [phone]    Angus Palms 10/16/2020, 1:49 PM

## 2020-10-16 NOTE — Progress Notes (Addendum)
[facility] [place]  Subjective  - Comfortable   Objective 131/81 67 (!) 97.3 F (36.3 C) (Oral) 13 95%  Intake/Output Summary (Last 24 hours) at 10/16/2020 0755 Last data filed at 10/16/2020 0735 Gross per 24 hour  Intake 973.72 ml  Output 2225 ml  Net -1251.28 ml    Left AKA incision is healing well Vac to suction  Lungs non labored breathing   Assessment/Planning: POD # 37 60 y.o. male is s/p L AKA and removal of graft; vac placed L groin  He presented with infected left PTFT bypass graft  Plan is to transition to p.o. antibiotics at discharge Bactrim for 6 weeks with refills.   Plan for [facility] and office changes for wound vac 2 times a week and wound care.  One will be [facility] RN [facility] starting Monday 8/29, and another in [facility] office on Thursday or Friday. AKA staples will be discharged at 4 weeks post op.    He is being discharged home today.  Bactrim BID for 6 weeks per Dr. Myra Gianotti Oxycodone for pain and increased Gabapentin 400 mg BID.  Mosetta Pigeon 10/16/2020 7:55 AM --  Laboratory Lab Results: No results for input(s): WBC, HGB, HCT, PLT in the last 72 hours. BMET No results for input(s): NA, K, CL, CO2, GLUCOSE, BUN, CREATININE, CALCIUM in the last 72 hours.  COAG Lab Results  Component Value Date   INR 1.1 10/01/2020   INR 1.2 05/08/2020   INR 1.1 05/08/2020   No results found for: PTT

## 2020-10-16 NOTE — Progress Notes (Signed)
D/C instructions given to patient including wound care and medications. All questions answered. PICC line removed. Home wound vac connect with good seal. Wheelchair, crutches, tub bench and home medications delivered to bedside. [facility] arranged for transportation home.  Versie Starks, RN

## 2020-10-16 NOTE — TOC Transition Note (Addendum)
Transition of Care (TOC) - CM/SW Discharge Note Benjamin Pierini RN, BSN Transitions of Care Unit 4E- RN Case Manager See [name] Team for direct phone #     Patient Details  Name: Benjamin [name] MRN: [id number] Date of Birth: Nov 29, 1960  Transition of Care [facility]) CM/SW Contact:  Benjamin Span, RN Phone Number: 10/16/2020, 4:28 PM   Clinical Narrative:    Noted pt states this am that sister does not want him coming home, consult has been placed to Benjamin [name] again- spoke with Benjamin [name] who will assess and come speak with pt- per conversation with Benjamin [name] with [name] pt is not appropriate for INPT rehab and remains appropriate for home with [facility].  Spoke with pt at bedside to review transition plans and discuss any concerns. Per pt he states that he wants to be able to "walk with prothesis when he goes home" again reviewed discussion from yesterday regarding timing for prothesis and plans for returning home with sister- Benjamin [name] and DME.  Pt gives verbal permission to contact his sister to review d/c plans.   Call made to pt's sister Benjamin [name]- discussed pt returning home with her- which Benjamin [name] confirmed she was prepared to have pt return home. She voiced she had been speaking with Benjamin [name]- community CSW and that she was just nervous as pt was nervous. Reviewed DME with sister- per sister pt has 3n1/BSC, RW, and crutches at home. Home also has ramp. Per sister he need w/c and tub bench for home which will arrange prior to discharge. Sister also states she does not drive and other family members working- pt will need assistance with transportation home.   Call made to [employer] for DME needs- wheelchair, tub bench, these to be delivered to room prior to discharge.   1035- received notice from Benjamin [name] at [name] that they are OON with pt's insurance and they will not be able to accept referral for home wound VAC needs as planned, Call made to [name] to see if they could assist- after review- they do not have the RN staff  to accept referral, Have now reached out to Benjamin [name] with [name] regarding [facility] needs for home would [name] drsg changes once/week.   1315- Received return call from Benjamin [name]/[name]- they have confirmed that they can accept referral for home wound VAC needs (once/week drsg changes)- plan will be for them to see pt first of Benjamin week by Tues 8/30 for home wound VAC drsg change with Vascular to see pt end of week for second change.   Have been notified by PT that pt needs new set of crutches as Benjamin pair he has are not Benjamin right size- have spoken with bedside RN who will take care of order and call ortho tech for new crutches prior to discharge.   Discussed with pt and sister outpt therapy- both agreeable and per Benjamin [name] pt can have transportation set up for this as well as to get pt to outpt MD appointment - Benjamin [name] to f/u with pt regarding his appointments and transportation needs on Monday. Referral has been made to [name] location outpt rehab for outpt PT/OT.    Unit to assist with transport home using [facility] door-to-door service      Barriers to Discharge: Barriers Resolved   Patient Goals and CMS Choice    Pt/sister    Discharge Placement               Home w/ [facility]  Discharge Plan and Services   Discharge Planning Services: CM Consult Post Acute Care Choice: Durable Medical Equipment, Home Health          DME Arranged: Vac DME Agency: [name] Date DME Agency Contacted: 10/15/20, 8/26 Time DME Agency Contacted: [phone] Representative spoke with at DME Agency: Benjamin [name] Arranged: RN HH Agency: [name] Date [name] Agency Contacted: 10/16/20 Time HH Agency Contacted: 1245 Representative spoke with at [facility] Agency: Benjamin [name]  Social Determinants of Health (SDOH) Interventions     Readmission Risk Interventions Readmission Risk Prevention Plan 10/16/2020  Transportation Screening Complete  PCP or Specialist Appt within 5-7 Days Complete  Home  Care Screening Complete  Medication Review (RN CM) Complete  Some recent data might be hidden

## 2020-10-16 NOTE — Plan of Care (Signed)
?  Problem: Clinical Measurements: ?Goal: Will remain free from infection ?Outcome: Progressing ?  ?

## 2020-10-16 NOTE — Progress Notes (Signed)
Orthopedic Tech Progress Note Patient Details:  Benjamin [name] Nov 17, 1960 [id number]  RN called requesting  a pair of CRUTCHES   Ortho Devices Type of Ortho Device: Crutches Ortho Device/Splint Interventions: Ordered, Adjustment   Post Interventions Patient Tolerated: Well, Ambulated well Instructions Provided: Care of device  [name] 10/16/2020, 2:48 PM

## 2020-10-16 NOTE — Telephone Encounter (Signed)
Received a call from pt sister [name] this morning. She shares that she is confused about disposition. LCSW shared that pt has been doing progressively really well with PT/OT teams and they are recommending pt return home with home health services rather than go to CIR/SNF. Pt will have wound vac which per notes HH should come to change one day a week with additional changes being done at [facility] office. Per chart review pt has been ordered a wheelchair (there is a ramp to get in and out of the home), and TOC team can discuss if there is any additional needs [facility] can think of. I discussed, as TOC team has with pt, that he will not receive a prosthetic and be fully mobile on it prior to returning home. That is a more long term care plan; and that pt has been mobilizing with crutches and walker 300+ ft multiple times daily. Pt sister expresses understanding, shares that pt is very nervous about it all.   We discussed Patient Care Fund assistance and timing of that. I again encouraged pt sister to speak with pt regarding the feasibility long term of keeping the vehicle and ongoing payments. Pt may not begin getting payments until December from disability claim. Otherwise, all pt needs are able to be met at home as pt sister manages the home expenses.   LCSW shared that I would reach out to [place], [facility], and ask her to call the house phone to discuss discharge. I have also sent the above to Laurence Slate, PA w/ [facility]. I am able to arrange transportation with pt through [employer] and [place] Medicaid once appointments are scheduled. I remain available for any ongoing questions/concerns at this time. Appreciate inpatient [facility] team assistance.     Westley Hummer, MSW, [place]  [phone]

## 2020-10-16 NOTE — Consult Note (Addendum)
[place] Nurse wound follow up Wound type: Surgical Measurement: 7.2cm x 1.0cm x 0.8cm Wound bed:60% red, 40% yellow Drainage (amount, consistency, odor) scant serous Periwound:intact, contracting Dressing procedure/placement/frequency: Patient premedicated 30 minutes prior to dressing change. Dressing to left groin removed; 1 piece of black foam, 1 skin barrier ring. Wound cleansed with NS.  Skin barrier ring applied and 1 piece of black foam.  Drape applied and dressing attached to 137mHg continuous negative pressure. An immediate seal is achieved.   Patient tolerated well.  Next dressing change is Monday, 8/29 if patient is still in house. Supply (1 small dressing kit, 1 skin barrier ring) ordered to room via Unit Secretary.  [facility] team will follow, and will remain available to this patient, the nursing and medical teams.   Thanks, LMaudie Flakes MSN, RN, GMicanopy CArther Abbott Pager# ([phone]

## 2020-10-19 ENCOUNTER — Telehealth: Payer: Self-pay | Admitting: Licensed Clinical Social Worker

## 2020-10-19 ENCOUNTER — Telehealth: Payer: Self-pay

## 2020-10-19 NOTE — Telephone Encounter (Signed)
LCSW received a call from [facility], pt sister DPR on file. She shares that pt is doing well overall. He is eating well, had a visit from the [facility] who changed vac. RN also discussed pt medications, helped him with pill box and advised him on what to do if vac alarms. Pt has been moving around home relatively well- he did have a fall in the bathroom when he got up without assistive device. Per sister there was no injury from fall and a family member assisted him with getting back up. Pt sister and this Clinical research associate discussed transportation resources. Pt can utilize Higher education careers adviser to get to upcoming vac change appt at [facility]. He was enrolled and signed waiver prior to discharge from hospital. Provided [facility] with that number and she will call to schedule appt; provided her with a brief overview of the program. If any additional questions/concerns arise pt and pt sister know to give me a call. Check still pending for Patient Care Fund assistance w/ car payment. I will advise them when that is received and mailed.   [name], MSW, LCSW [facility] Health Heart/Vascular Care Navigation  [phone]

## 2020-10-19 NOTE — Telephone Encounter (Signed)
Transition Care Management Follow-up Telephone Call Date of discharge and from where: 10/16/2020 [facility] How have you been since you were released from the hospital? Pt is doing good, pain is moderate.  Any questions or concerns? No  Items Reviewed: Did the pt receive and understand the discharge instructions provided? Yes  Medications obtained and verified? Yes  Other? Yes  Any new allergies since your discharge? No  Dietary orders reviewed? Yes Do you have support at home? Yes   Home Care and Equipment/Supplies: Were home health services ordered? not applicable If so, what is the name of the agency? N/a   Has the agency set up a time to come to the patient's home? not applicable Were any new equipment or medical supplies ordered?  No What is the name of the medical supply agency? N/a  Were you able to get the supplies/equipment? not applicable Do you have any questions related to the use of the equipment or supplies? No  Functional Questionnaire: (I = Independent and D = Dependent) ADLs: D  Bathing/Dressing- D  Meal Prep- D  Eating- D  Maintaining continence- D  Transferring/Ambulation- D  Managing Meds- D  Follow up appointments reviewed:  PCP Hospital f/u appt confirmed? Yes  Scheduled to see Arnette Felts on 10/21/2020 @ 430. Specialist Hospital f/u appt confirmed? Yes  Scheduled to see [facility] on 10/23/2020 @ Vascular & Vein. Are transportation arrangements needed? Yes ** will enroll upon request**  If their condition worsens, is the pt aware to call PCP or go to the Emergency Dept.? Yes Was the patient provided with contact information for the PCP's office or ED? Yes Was to pt encouraged to call back with questions or concerns? Yes

## 2020-10-21 ENCOUNTER — Telehealth: Payer: Self-pay | Admitting: Licensed Clinical Social Worker

## 2020-10-21 ENCOUNTER — Ambulatory Visit (INDEPENDENT_AMBULATORY_CARE_PROVIDER_SITE_OTHER): Payer: Medicaid Other | Admitting: Nurse Practitioner

## 2020-10-21 ENCOUNTER — Encounter: Payer: Self-pay | Admitting: Nurse Practitioner

## 2020-10-21 DIAGNOSIS — I739 Peripheral vascular disease, unspecified: Secondary | ICD-10-CM | POA: Diagnosis not present

## 2020-10-21 DIAGNOSIS — Z87891 Personal history of nicotine dependence: Secondary | ICD-10-CM

## 2020-10-21 DIAGNOSIS — I10 Essential (primary) hypertension: Secondary | ICD-10-CM | POA: Diagnosis not present

## 2020-10-21 DIAGNOSIS — S78119A Complete traumatic amputation at level between unspecified hip and knee, initial encounter: Secondary | ICD-10-CM

## 2020-10-21 NOTE — Telephone Encounter (Signed)
LCSW received a call and teams message from [place], [place], from Vein and Vascular office. She states that bugs had previously been identified on pt during office visits. She is inquiring if this "has been resolved." Per her report they were having to utilize special room/equipment to ensure that pt safe during visit and for staff protection.  LCSW was not aware of any pest issues as none have been reported by pt/pt sister. I reviewed documentation from previous [facility] and PCP office visits and unable to find any notation of this discovery or discussions with family.   I encouraged Shon Millet to call pt sister [name], who is pt advocate and whose home he lives in to make her aware. If this in fact is a noted issue, family will have to address this through pest management or fumigation. I have also discussed the case with team lead Lasandra Beech.   [name], MSW, LCSW [facility] Heart/Vascular Care Navigation  [phone]

## 2020-10-21 NOTE — Progress Notes (Addendum)
I,Benjamin [name],acting as a Education administrator for Benjamin Brine, FNP.,have documented all relevant documentation on the behalf of Benjamin Brine, FNP,as directed by  Benjamin Brine, FNP while in the presence of Benjamin [name], Benjamin [name].      Telephone visit   This visit type was conducted due to national recommendations for restrictions regarding the COVID-19 Pandemic (e.g. social distancing) in an effort to limit this patient's exposure and mitigate transmission in our community.  Due to his co-morbid illnesses, this patient is at least at moderate risk for complications without adequate follow up.  This format is felt to be most appropriate for this patient at this time.  All issues noted in this document were discussed and addressed.  A limited physical exam was performed with this format.    This visit type was conducted due to national recommendations for restrictions regarding the COVID-19 Pandemic (e.g. social distancing) in an effort to limit this patient's exposure and mitigate transmission in our community.  Patients identity confirmed using two different identifiers.  This format is felt to be most appropriate for this patient at this time.  All issues noted in this document were discussed and addressed.  No physical exam was performed (except for noted visual exam findings with Video Visits).     Connected with Benjamin [name] via telophone on 10/21/2020 with 2 identifiers  Date:  11/20/2020   ID:  Benjamin [name], DOB [birth year], MRN [id number]  Patient Location:  Spoke with Benjamin [name] at home   Provider location:   Office   The patient does not have symptoms concerning for COVID-19 infection (fever, chills, cough, or new shortness of breath).   Chief Complaint:  hospital follow up after left leg amputation  History of Present Illness:    Benjamin [name] is a 60 y.o. male who presents via telephone visit post hospital follow up for left leg amputation. He was admitted due to worsening blood flow to the left  leg after several attempts at opening the vessel. He was having worsening pain with sever claudication/res and a new wound in March 2022.  He had a CTA left leg with thrombosed left femoral to popliteal bypass graft. Had an above knee amputation left, had 2 units PRBC and had phantom pain. Was started on Neurontin and he has a vac. He has not started PT at this time.    Past Medical History:  Diagnosis Date   Back pain    Heavy smoker    Hyperlipidemia LDL goal <70    Hypertension    Peripheral arterial occlusive disease ([place]) 06/2013   Bilateral femoral artery disease   Past Surgical History:  Procedure Laterality Date   ABDOMINAL AORTOGRAM W/LOWER EXTREMITY N/A 07/12/2016   Procedure: Abdominal Aortogram w/Lower Extremity;  Surgeon: Serafina Mitchell, MD;  Location: [facility];  Service: Cardiovascular;  Laterality: N/A;  Bilateral extermity: Patent Renal As. No sig Dz in infrarenal Abd Aorta. Normal Bilat Iliac arteries. R SFA is 100% @ origin - recon in AK-Pop A. R PT A patent. L CFA occluded. L PFA recon @ origin, L SFA occluded w/ recon in AK Pop A.   ABDOMINAL AORTOGRAM W/LOWER EXTREMITY N/A 10/15/2019   Procedure: ABDOMINAL AORTOGRAM W/LOWER EXTREMITY;  Surgeon: Serafina Mitchell, MD;  Location: [facility];  Service: Cardiovascular;  Laterality: N/A;   AMPUTATION Left 10/04/2020   Procedure: LEFT ABOVE KNEE AMPUTATION;  Surgeon: Serafina Mitchell, MD;  Location: [facility];  Service: Vascular;  Laterality: Left;  AORTA - BILATERAL FEMORAL ARTERY BYPASS GRAFT N/A 05/08/2020   Procedure: AORTA BIFEMORAL BYPASS GRAFT;  Surgeon: Serafina Mitchell, MD;  Location: [facility];  Service: Vascular;  Laterality: N/A;   COLONOSCOPY  06/2016   never   ENDARTERECTOMY FEMORAL Left 11/14/2019   Procedure: Left groin exploration, Redo left femoral artery exposure;  Surgeon: Serafina Mitchell, MD;  [facility];  Service: Vascular;  Laterality: Left;   FEMORAL-FEMORAL BYPASS GRAFT N/A 11/13/2019    Procedure: BYPASS GRAFT FEMORAL-FEMORAL ARTERY RIGHT TO LEFT USING HEMASHIELD GOLD GRAFT 52m x 30cm;  Surgeon: BSerafina Mitchell MD;  [facility];  Service: Vascular;  Laterality: N/A;   FEMORAL-POPLITEAL BYPASS GRAFT Left 11/13/2019   Procedure: LEFT FEMORAL BELOW KNEE-POPLITEAL ARTERY USING NON-REVERSED GREATER SAPHENOUS VEIN;  Surgeon: BSerafina Mitchell MD;  [facility];  Service: Vascular;  Laterality: Left;   FEMORAL-POPLITEAL BYPASS GRAFT Left 05/08/2020   Procedure: LEFT REDO BYPASS GRAFT COMMON FEMORAL- BELOW KNEE POPLITEAL ARTERY USING PROPATEN GRAFT;  Surgeon: BSerafina Mitchell MD;  [facility];  Service: Vascular;  Laterality: Left;   I & D EXTREMITY Left 09/07/2020   Procedure: IRRIGATION AND DEBRIDEMENT LEFT LEG WITH EXCISION OF LEFT LEG DISTAL BYPASS GRAFT;  Surgeon: CMarty Heck MD;  [facility]  Service: Vascular;  Laterality: Left;   LOWER EXTREMITY ANGIOGRAM Left 11/14/2019   Procedure: LEFT LOWER EXTREMITY ANGIOGRAM, BYPASS GRAFT ANGIOPLASTY;  Surgeon: BSerafina Mitchell MD;  [facility];  Service: Vascular;  Laterality: Left;   NO PAST SURGERIES  06/2016   PERIPHERAL VASCULAR INTERVENTION  10/15/2019   Procedure: PERIPHERAL VASCULAR INTERVENTION;  Surgeon: BSerafina Mitchell MD;  [facility];  Service: Cardiovascular;;  Rt Iliac   REMOVAL OF GRAFT Left 10/04/2020   Procedure: REMOVAL OF LEFT FEMORAL TO POPITEAL BYPASS GRAFT;  Surgeon: BSerafina Mitchell MD;  [facility];  Service: Vascular;  Laterality: Left;     Current Meds  Medication Sig   acetaminophen (TYLENOL) 500 MG tablet Take 2 tablets (1,000 mg total) by mouth every 6 (six) hours as needed for mild pain or headache. (Patient taking differently: Take 500 mg by mouth 3 (three) times daily.)   buPROPion (WELLBUTRIN XL) 150 MG 24 hr tablet TAKE 1 TABLET (150 MG TOTAL) BY MOUTH EVERY MORNING.   ceFAZolin (ANCEF) IVPB Inject 2 g into the vein every 8 (eight) hours. Indication:   Bypass graft infection First Dose: No Last Day of Therapy: 10/17/20 Labs - Once weekly:  CBC/D and BMP, Labs - Every other week:  ESR and CRP Method of administration: IV Push Method of administration may be changed at the discretion of home infusion pharmacist based upon assessment of the patient and/or caregiver's ability to self-administer the medication ordered.   gabapentin (NEURONTIN) 400 MG capsule Take 1 capsule (400 mg total) by mouth 2 (two) times daily.   Homeopathic Products (LEG CRAMPS PM SL) Place 2 tablets under the tongue at bedtime.   lactose free nutrition (BOOST) LIQD Take 237 mLs by mouth daily.   OVER THE COUNTER MEDICATION Take 1 capsule by mouth daily. Stacker energy pill   Vitamin D, Ergocalciferol, (DRISDOL) 1.25 MG (50000 UNIT) CAPS capsule Take 1 capsule (50,000 Units total) by mouth 2 (two) times a week.   [DISCONTINUED] atorvastatin (LIPITOR) 20 MG tablet TAKE 1 TABLET(20 MG) BY MOUTH DAILY (Patient taking differently: Take 20 mg by mouth daily.)   [DISCONTINUED] clopidogrel (PLAVIX) 75 MG tablet Take  1 tablet (75 mg total) by mouth daily.   [DISCONTINUED] gabapentin (NEURONTIN) 400 MG capsule Take 1 capsule (400 mg total) by mouth 2 (two) times daily.   [DISCONTINUED] oxyCODONE-acetaminophen (PERCOCET/ROXICET) 5-325 MG tablet Take 1 tablet by mouth every 6 (six) hours as needed for moderate pain.   [DISCONTINUED] oxyCODONE-acetaminophen (PERCOCET/ROXICET) 5-325 MG tablet Take 1 tablet by mouth every 6 (six) hours as needed.   [DISCONTINUED] sulfamethoxazole-trimethoprim (BACTRIM DS) 800-160 MG tablet Take 1 tablet by mouth 2 (two) times daily.   [DISCONTINUED] sulfamethoxazole-trimethoprim (BACTRIM DS) 800-160 MG tablet Take 1 tablet by mouth 2 (two) times daily.     Allergies:   Aspirin and Garlic   Social History   Tobacco Use   Smoking status: Every Day    Packs/day: 1.50    Years: 29.00    Pack years: 43.50    Types: Cigarettes   Smokeless tobacco:  Never   Tobacco comments:    he is down to 1 PPD - 07/22/2020  Vaping Use   Vaping Use: Never used  Substance Use Topics   Alcohol use: No   Drug use: No     Family Hx: The patient's family history includes Arthritis in his mother; COPD in his father; Multiple sclerosis in his sister; Stroke in his father.  ROS:   Please see the history of present illness.    Review of Systems  Constitutional: Negative.   Respiratory: Negative.    Cardiovascular: Negative.   Musculoskeletal:        Reports left leg amputation  Neurological:  Negative for dizziness and headaches.  Psychiatric/Behavioral: Negative.     All other systems reviewed and are negative.   Labs/Other Tests and Data Reviewed:    Recent Labs: 05/09/2020: Magnesium 1.8 10/01/2020: ALT 45 10/09/2020: BUN 8; Creatinine, Ser 0.63; Hemoglobin 11.1; Platelets 636; Potassium 3.9; Sodium 134   Recent Lipid Panel Lab Results  Component Value Date/Time   CHOL 154 09/06/2020 03:30 AM   CHOL 219 (H) 07/22/2020 02:35 PM   TRIG 91 09/06/2020 03:30 AM   HDL 40 (L) 09/06/2020 03:30 AM   HDL 54 07/22/2020 02:35 PM   CHOLHDL 3.9 09/06/2020 03:30 AM   LDLCALC 96 09/06/2020 03:30 AM   LDLCALC 147 (H) 07/22/2020 02:35 PM   LDLCALC 104 (H) 01/20/2017 09:08 AM    Wt Readings from Last 3 Encounters:  11/17/20 145 lb (65.8 kg)  11/06/20 150 lb (68 kg)  10/01/20 147 lb (66.7 kg)     Exam:    Vital Signs:  There were no vitals taken for this visit.    Physical Exam Vitals reviewed.  Constitutional:      General: He is not in acute distress. Pulmonary:     Effort: Pulmonary effort is normal. No respiratory distress.  Neurological:     Mental Status: He is alert and oriented to person, place, and time.  Psychiatric:        Mood and Affect: Mood normal.        Behavior: Behavior normal.        Thought Content: Thought content normal.        Judgment: Judgment normal.    ASSESSMENT & PLAN:     1. Peripheral vascular  disease ([place]) TCM Performed. A member of the clinical team spoke with the patient upon dischare. Discharge summary was reviewed in full detail during the visit. Meds reconciled and compared to discharge meds. Medication list is updated and reviewed with the patient.  Greater than  50% face to face time was spent in counseling an coordination of care.  All questions were answered to the satisfaction of the patient.   He is doing fairly well and is to start PT and follow up with vascular  2. Primary hypertension Unable to check blood pressure this visit as he is home.  He had been having an elevated blood pressure and is to take amlodipine, he had stopped taking and is advised to pick up at the pharmacy  3. History of smoking 10-25 pack years   4. Amputation above knee (HCC) Left above the knee amputation, he is being followed by vascular    COVID-19 Education: The signs and symptoms of COVID-19 were discussed with the patient and how to seek care for testing (follow up with PCP or arrange E-visit).  The importance of social distancing was discussed today.  Patient Risk:   After full review of this patients clinical status, I feel that they are at least moderate risk at this time.  Time:   Today, I have spent 12 minutes/ seconds with the patient with telehealth technology discussing above diagnoses.     Medication Adjustments/Labs and Tests Ordered: Current medicines are reviewed at length with the patient today.  Concerns regarding medicines are outlined above.   Tests Ordered: No orders of the defined types were placed in this encounter.   Medication Changes: No orders of the defined types were placed in this encounter.   Disposition:  Follow up prn  Signed, Benjamin Brine, FNP

## 2020-10-21 NOTE — Discharge Summary (Signed)
[facility] Discharge Summary   Patient ID:  Benjamin [name] MRN: [id number] DOB/AGE: April 12, 1960 60 y.o.  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 Trula Slade.  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  He underwent stenting of a right common and external iliac stenosis.  Then on 11/13/2019 he had a right to left femoral-femoral bypass graft followed by left femoral to below-knee popliteal artery bypass graft with saphenous vein.  He was taken back to the operating room on 9/23 due to a bypass graft stenosis which was treated with balloon angioplasty.  The patient returned for surveillance ultrasound imaging in March of 2022,which showed that the femoral-femoral graft and the femoral-popliteal graft were occluded.  He was having symptoms of severe claudication/rest pain and he had a new wound on the lateral aspect of his foot.  The original ulcer on the top of his great toe has healed.  Therefore on 05/08/2020 he underwent an aortobifemoral bypass graft using a 16 x 8 bifurcated graft and a redo left femoral to below-knee popliteal bypass with PTFE by Dr. Trula Slade  CTA of the left leg shows thrombosed left  femoral to popliteal bypass graft.  There is an adjacent fluid collection around the distal bypass graft.   Hospital Course:  Benjamin [name] is a 60 y.o. male is S/P Left Procedure(s): LEFT ABOVE KNEE AMPUTATION REMOVAL OF LEFT FEMORAL TO POPITEAL BYPASS GRAFT REDO LEFT FERMORAL ARTERY GROIN EXPOSURE, [place] Hospital course: Will need wet-to-dry dressing changes to the left groin twice daily.  Continue Vanc/ Zosyn.  HGB 9.6 stable s/p transfusion 2 units PRBC 10/04/20 blood loss anemia.  He does have some phantom pain for which we started him on Neurontin.   Poor healing left groin: VAC dressing in place. DME wheel chair ordered per patient request.  He will have 2 vac changes per week.  One will be [facility] RN [facility] starting Monday 8/29, and another in [facility] office on Thursday or Friday.   Plan is to transition to p.o. antibiotics at discharge    Bactrim for 6 weeks with refills.  Oxycodone for pain and increased Gabapentin 400 mg BID.  Significant Diagnostic Studies: CBC Lab Results  Component Value Date   WBC 12.9 (H) 10/09/2020   HGB 11.1 (L) 10/09/2020   HCT 32.8 (L) 10/09/2020   MCV 72.2 (L) 10/09/2020   PLT 636 (H) 10/09/2020    BMET    Component Value Date/Time   NA 134 (L) 10/09/2020 0500   NA 140 07/22/2020 1150   K 3.9 10/09/2020 0500   CL 101 10/09/2020 0500   CO2 23 10/09/2020 0500   GLUCOSE 104 (H) 10/09/2020 0500   BUN 8 10/09/2020 0500   BUN 14 07/22/2020  1150   CREATININE 0.63 10/09/2020 0500   CREATININE 1.02 01/20/2017 0908   CALCIUM 9.0 10/09/2020 0500   GFRNONAA >60 10/09/2020 0500   GFRAA >60 11/14/2019 0508   COAG Lab Results  Component Value Date   INR 1.1 10/01/2020   INR 1.2 05/08/2020   INR 1.1 05/08/2020     Disposition:  Discharge to :Skilled nursing facility Discharge Instructions     Call MD for:  redness, tenderness, or signs of infection (pain, swelling, bleeding, redness, odor or green/yellow discharge around  incision site)   Complete by: As directed    Call MD for:  severe or increased pain, loss or decreased feeling  in affected limb(s)   Complete by: As directed    Call MD for:  temperature >100.5   Complete by: As directed    Resume previous diet   Complete by: As directed       Allergies as of 10/16/2020       Reactions   Aspirin Nausea And Vomiting   Garlic Other (See Comments)   sneezing        Medication List     TAKE these medications    acetaminophen 500 MG tablet Commonly known as: TYLENOL Take 2 tablets (1,000 mg total) by mouth every 6 (six) hours as needed for mild pain or headache. What changed:  how much to take when to take this Notes to patient: As needed for pain   atorvastatin 20 MG tablet Commonly known as: LIPITOR TAKE 1 TABLET(20 MG) BY MOUTH DAILY What changed:  See the new instructions. Another medication with the same name was removed. Continue taking this medication, and follow the directions you see here.   buPROPion 150 MG 24 hr tablet Commonly known as: WELLBUTRIN XL TAKE 1 TABLET (150 MG TOTAL) BY MOUTH EVERY MORNING.   ceFAZolin  IVPB Commonly known as: ANCEF Inject 2 g into the vein every 8 (eight) hours. Indication:  Bypass graft infection First Dose: No Last Day of Therapy: 10/17/20 Labs - Once weekly:  CBC/D and BMP, Labs - Every other week:  ESR and CRP Method of administration: IV Push Method of administration may be changed at the discretion of home infusion pharmacist based upon assessment of the patient and/or caregiver's ability to self-administer the medication ordered.   clopidogrel 75 MG tablet Commonly known as: Plavix Take 1 tablet (75 mg total) by mouth daily.   gabapentin 400 MG capsule Commonly known as: NEURONTIN Take 1 capsule (400 mg total) by mouth 2 (two) times daily. What changed:  medication strength how much to take how to take this when to take this   gabapentin 400 MG capsule Commonly known as:  Neurontin Take 1 capsule (400 mg total) by mouth 2 (two) times daily. What changed: You were already taking a medication with the same name, and this prescription was added. Make sure you understand how and when to take each.   lactose free nutrition Liqd Take 237 mLs by mouth daily.   LEG CRAMPS PM SL Place 2 tablets under the tongue at bedtime.   OVER THE COUNTER MEDICATION Take 1 capsule by mouth daily. Stacker energy pill   oxyCODONE-acetaminophen 5-325 MG tablet Commonly known as: PERCOCET/ROXICET Take 1 tablet by mouth every 6 (six) hours as needed for moderate pain.   oxyCODONE-acetaminophen 5-325 MG tablet Commonly known as: PERCOCET/ROXICET Take 1 tablet by mouth every 6 (six) hours as needed.   sulfamethoxazole-trimethoprim 800-160 MG tablet Commonly known as: BACTRIM DS  Take 1 tablet by mouth 2 (two) times daily.   sulfamethoxazole-trimethoprim 800-160 MG tablet Commonly known as: BACTRIM DS Take 1 tablet by mouth 2 (two) times daily.   Vitamin D (Ergocalciferol) 1.25 MG (50000 UNIT) Caps capsule Commonly known as: DRISDOL Take 1 capsule (50,000 Units total) by mouth 2 (two) times a week.       Verbal and written Discharge instructions given to the patient. Wound care per Discharge AVS  Follow-up Information     [facility] - Follow up in 1 week(s).   Specialty: Vascular Surgery Contact information: [address] [place] [facility] Follow up.   Why: wheelchair and tub bench arranged- to be delivered to room prior to discharge Contact information: [place] [address] [phone]         [facility] Follow up.   Specialty: Home Health Services Why: [place] arranged -for home wound VAC drsg needs once/week- they will contact you to schedule visit first of the week Contact information: [place]  [address] [phone]         [facility] Follow up.   Specialty: Rehabilitation Why: outpt PT/OT referral made- you can call to see about scheduling if you have not heard from them within 7 days post discharge. Contact information: [address] [id number] [place] [phone]                Signed: Roxy Horseman 10/21/2020, 12:53 PM

## 2020-10-21 NOTE — Patient Instructions (Signed)

## 2020-10-22 ENCOUNTER — Telehealth: Payer: Self-pay | Admitting: Licensed Clinical Social Worker

## 2020-10-22 NOTE — Telephone Encounter (Signed)
LCSW received a call from pt sister, inquiring if Patient Assistance Fund has been sent to [employer] for car loan. LCSW shared that check has been received from accounts payable and will be placed in mail today for full amount on the statement. LCSW will f/u when able to assist with insurance re-instatement. Otherwise pt sister shares pt doing well. RN from [facility] agency stopped by yesterday for visit and pt had hospital f/u with PCP. Still waiting on disability payments, able to otherwise get medications and all other basic needs (housing, food etc). I remain available.   [name], MSW, LCSW [facility] Care Navigation  [phone]

## 2020-10-23 ENCOUNTER — Ambulatory Visit (INDEPENDENT_AMBULATORY_CARE_PROVIDER_SITE_OTHER): Payer: Medicaid Other | Admitting: Physician Assistant

## 2020-10-23 ENCOUNTER — Other Ambulatory Visit: Payer: Self-pay

## 2020-10-23 VITALS — BP 175/101 | HR 95 | Temp 98.6°F

## 2020-10-23 DIAGNOSIS — S81802S Unspecified open wound, left lower leg, sequela: Secondary | ICD-10-CM

## 2020-10-23 DIAGNOSIS — I739 Peripheral vascular disease, unspecified: Secondary | ICD-10-CM

## 2020-10-23 NOTE — Progress Notes (Signed)
POST OPERATIVE OFFICE NOTE    CC:  F/u for surgery  HPI:  This is a 60 y.o. male who is s/p removal of infected left fem-pop bypass graft with re-do of left femoral artery exposure and placement of antibiotic beads, left AKA by Dr. Trula Slade on 10/04/2020. VAC dressing was placed to left groin and he comes in today for wound check. He was prescribed 6 weeks of Bactrim at discharge. He brings his medications with him today and we reviewed them all. He did not have his Plavix and he said he had it at home. Says he is eating well and is taking MVI.  History of prior left and right CIA stent placement in August of 2021. He then underwent right to left femoral-femoral bypass with a left femoral to below-knee popliteal bypass with vein in 2021 by Dr. Trula Slade.  This ultimately thrombosed and he was taken back in March 2022 for aortobifemoral bypass and a redo left femoropopliteal bypass with prosthetic also by Dr. Trula Slade.  Allergies  Allergen Reactions   Aspirin Nausea And Vomiting   Garlic Other (See Comments)    sneezing    Current Outpatient Medications  Medication Sig Dispense Refill   acetaminophen (TYLENOL) 500 MG tablet Take 2 tablets (1,000 mg total) by mouth every 6 (six) hours as needed for mild pain or headache. (Patient taking differently: Take 500 mg by mouth 3 (three) times daily.) 100 tablet 0   atorvastatin (LIPITOR) 20 MG tablet TAKE 1 TABLET(20 MG) BY MOUTH DAILY (Patient taking differently: Take 20 mg by mouth daily.) 30 tablet 3   buPROPion (WELLBUTRIN XL) 150 MG 24 hr tablet TAKE 1 TABLET (150 MG TOTAL) BY MOUTH EVERY MORNING. 90 tablet 1   ceFAZolin (ANCEF) IVPB Inject 2 g into the vein every 8 (eight) hours. Indication:  Bypass graft infection First Dose: No Last Day of Therapy: 10/17/20 Labs - Once weekly:  CBC/D and BMP, Labs - Every other week:  ESR and CRP Method of administration: IV Push Method of administration may be changed at the discretion of home infusion  pharmacist based upon assessment of the patient and/or caregiver's ability to self-administer the medication ordered. 126 Units 0   clopidogrel (PLAVIX) 75 MG tablet Take 1 tablet (75 mg total) by mouth daily. 90 tablet 3   gabapentin (NEURONTIN) 400 MG capsule Take 1 capsule (400 mg total) by mouth 2 (two) times daily. 60 capsule 5   Homeopathic Products (LEG CRAMPS PM SL) Place 2 tablets under the tongue at bedtime.     lactose free nutrition (BOOST) LIQD Take 237 mLs by mouth daily.     OVER THE COUNTER MEDICATION Take 1 capsule by mouth daily. Stacker energy pill     oxyCODONE-acetaminophen (PERCOCET/ROXICET) 5-325 MG tablet Take 1 tablet by mouth every 6 (six) hours as needed. 30 tablet 0   sulfamethoxazole-trimethoprim (BACTRIM DS) 800-160 MG tablet Take 1 tablet by mouth 2 (two) times daily. 84 tablet 0   Vitamin D, Ergocalciferol, (DRISDOL) 1.25 MG (50000 UNIT) CAPS capsule Take 1 capsule (50,000 Units total) by mouth 2 (two) times a week. 24 capsule 0   No current facility-administered medications for this visit.     ROS:  See HPI  BP (!) 175/101 (BP Location: Right Arm)   Pulse 95   SpO2 95%   Physical Exam: General appearance: Awake, alert in no apparent distress Cardiac: Heart rate and rhythm are regular Respirations: Nonlabored Extremities: Left AK Amputation site incision is well approximated without  bleeding or hematoma.  Anterior and posterior flaps are warm and well-perfused. Left groin wound is granulating. Measures approx 2 cm deep with slight tunneling superiorly and inferiorly. No drainage.   Assessment/Plan:  This is a 60 y.o. male who is s/p: left AKA and I and D of left groin. Healing well. Continue abx. VAC dressing replace. Follow-up in 2 weeks.  Risa Grill, PA-C [facility] [phone]  Clinic MD:  Dorna Leitz of left fe

## 2020-10-27 ENCOUNTER — Other Ambulatory Visit: Payer: Self-pay

## 2020-10-27 ENCOUNTER — Telehealth: Payer: Self-pay

## 2020-10-27 DIAGNOSIS — I10 Essential (primary) hypertension: Secondary | ICD-10-CM

## 2020-10-27 MED ORDER — AMLODIPINE BESYLATE 5 MG PO TABS: 5.0000 mg | ORAL_TABLET | Freq: Every day | ORAL | 0 refills | Status: DC

## 2020-10-27 NOTE — Telephone Encounter (Signed)
Benjamin [name] from [place] [facility] also called to report that patient has been driving - she has also instructed him to stop that. He has been non-compliant with dressings. She said she had a talk with him today and will keep [name] informed of his progress. Frances Furbish will now be ordering the wound vac dressing supplies.

## 2020-10-27 NOTE — Telephone Encounter (Signed)
Patient's sister calls today to ask if he can go out to the grocery store/church etc. He was driving himself until she took his keys. He is still taking pain medicine. I advised her to have him wait until his follow up visit with [facility]. She verbalized understanding .

## 2020-10-28 ENCOUNTER — Telehealth: Payer: Self-pay | Admitting: Licensed Clinical Social Worker

## 2020-10-28 ENCOUNTER — Other Ambulatory Visit: Payer: Self-pay

## 2020-10-28 MED ORDER — CLOPIDOGREL BISULFATE 75 MG PO TABS: 75.0000 mg | ORAL_TABLET | Freq: Every day | ORAL | 3 refills | Status: DC

## 2020-10-28 MED ORDER — ATORVASTATIN CALCIUM 20 MG PO TABS: 20.0000 mg | ORAL_TABLET | Freq: Every day | ORAL | 3 refills | Status: DC

## 2020-10-28 NOTE — Progress Notes (Signed)
Heart and Vascular Care Navigation  10/28/2020  Benjamin [name] 12-21-1960 [id number]  Reason for Referral:  Engaged with patient by telephone for follow up visit for Heart and Vascular Care Coordination.                                                                                                    Assessment:                    LCSW reached out to pt car insurance company to assist w/ Patient Care Fund to reinstate coverage. Pt and pt sister Benjamin [name] both on call via three way to give their permission. Pt and pt sister very pleased as pt has received his first disability payment. He plans on going by [employer] to set up an account where it will be deposited that way he can keep his accounts all together. Pt and pt sister appreciative of Patient Care Fund assistance to bridge pt. I have also sent pt information about applying for free cell phone via [facility] along with additional help lines offered through his plan.                     HRT/VAS Care Coordination     Patients Home Cardiology Office [facility]   Outpatient Care Team Social Worker   Social Worker Name: Benjamin [name] [place], [phone]   Living arrangements for the past 2 months Single Family Home   Lives with: Self; Siblings   Patient Current Insurance Coverage Medicaid   Patient Has Concern With Paying Medical Bills Yes   Patient Concerns With Medical Bills past hospitalizations/procedures   Medical Bill Referrals: pt currently Medicaid pending   Does Patient Have Prescription Coverage? Yes   Patient Prescription Assistance Programs Other   [place] [facility] Medications mailed pt NCMedAssist application and information about [facility]   Other Assistance Programs Medications encouraged pt to continue using [facility] pharmacy and to bring Medicaid cards   Home Assistive Devices/Equipment Crutches   DME Agency [employer]   [facility] Agency [facility]   Current home services DME  RW, W/C        Social History:                                                                             SDOH Screenings   Alcohol Screen: Not on file  Depression (PHQ2-9): Low Risk    PHQ-2 Score: 0  Financial Resource Strain: Medium Risk   Difficulty of Paying Living Expenses: Somewhat hard  Food Insecurity: No Food Insecurity   Worried About Programme researcher, broadcasting/film/video in the Last Year: Never true   Ran Out of Food in the Last Year: Never true  Housing: Low Risk    Last Housing  Risk Score: 0  Physical Activity: Not on file  Social Connections: Not on file  Stress: Not on file  Tobacco Use: High Risk   Smoking Tobacco Use: Every Day   Smokeless Tobacco Use: Never  Transportation Needs: No Transportation Needs   Lack of Transportation (Medical): No   Lack of Transportation (Non-Medical): No    SDOH Interventions: Financial Resources:  Financial Strain Interventions: Other (Comment) (now has started recieving disability payments/enrolled in [facility]); assistance w/ payments from Patient Care Fund     Follow-up plan:   LCSW will f/u next week before visit at [facility] to ensure pt has a ride. Available before that time for any additional questions/concerns.

## 2020-10-28 NOTE — Telephone Encounter (Signed)
LCSW received a call from pt sister [name]. She shares that [facility] came out yesterday to change wound vac. She shares pt still antsy about receiving his disability income and getting more independent but has been doing well overall.  She shares that pt is in need of his clopidogrel; upon chart review pt has been receiving his medications at [facility], he has refills available. Pt sister will call [facility] first to see if she can have medication refilled; if not then she will call [facility] and if any additional issues can call me back. Pt sister also has questions about OPPT, it was ordered for [employer] by [facility] staff prior to d/c. She was updated that it may take some time for this to be ordered but I will f/u and check on that. Pt sister shares that pt cell phone is now gone, unclear when it was lost- so she requests that the home phone be utilized for calls. Pt may have a cell phone benefit under his [employer]. I will look into that and f/u with pt sister and pt this afternoon.   [name], MSW, LCSW [facility] Health Heart/Vascular Care Navigation  [phone]

## 2020-10-29 ENCOUNTER — Telehealth: Payer: Self-pay | Admitting: Licensed Clinical Social Worker

## 2020-10-29 ENCOUNTER — Other Ambulatory Visit: Payer: Self-pay

## 2020-10-29 ENCOUNTER — Telehealth: Payer: Self-pay

## 2020-10-29 MED ORDER — CLOPIDOGREL BISULFATE 75 MG PO TABS
75.0000 mg | ORAL_TABLET | Freq: Every day | ORAL | 3 refills | Status: DC
  Filled 2020-10-29 – 2020-11-25 (×3): qty 90, 90d supply, fill #0

## 2020-10-29 MED ORDER — ATORVASTATIN CALCIUM 20 MG PO TABS
20.0000 mg | ORAL_TABLET | Freq: Every day | ORAL | 3 refills | Status: DC
  Filled 2020-10-29: qty 90, 90d supply, fill #0

## 2020-10-29 MED ORDER — GABAPENTIN 400 MG PO CAPS
ORAL_CAPSULE | ORAL | 3 refills | Status: DC
  Filled 2020-10-29: qty 90, fill #0
  Filled 2020-11-25: qty 90, 30d supply, fill #0

## 2020-10-29 NOTE — Telephone Encounter (Signed)
Patient calls today to request that [facility]. I rerouted his prescriptions. He is also c/o some phantom pain at night that wakes him up. Says the gabapentin is helpful. Discussed with PA, patient may take 2 gabapentin capsules at night and one in the morning. Sent in updated RX and informed patient.

## 2020-10-29 NOTE — Telephone Encounter (Signed)
LCSW received a call from pt and pt sister. There was some issues with getting pts medications filled this morning at [facility]. Pt distressed by price of medications as they were telling him there were issues with his medications. LCSW called [facility] but unable to assess what the issues are for pt. LCSW has requested that pt call his PCP and his [facility] providers offices to have medications transitioned to [facility]. That way it may be a bit easier for them to understand what is happening with his coverage and financially may be more affordable. I provided pt with these specific instructions along with provider office numbers and the number for [facility]. Pt and pt sister will be in contact with me if any additional questions; they are aware I will be out of office tomorrow.    [name], MSW, LCSW [facility] Health Heart/Vascular Care Navigation  [phone]

## 2020-10-30 ENCOUNTER — Other Ambulatory Visit: Payer: Self-pay

## 2020-11-02 ENCOUNTER — Telehealth: Payer: Self-pay | Admitting: Licensed Clinical Social Worker

## 2020-11-02 NOTE — Telephone Encounter (Signed)
LCSW set up ride for pt appointment this Friday at [facility] office. Transportation Services will call to confirm ride on Thursday, verified that they have the patients home number as his cell number is no longer active (cannot find phone).   [name], MSW, LCSW [facility] Health Heart/Vascular Care Navigation  [phone]

## 2020-11-03 ENCOUNTER — Other Ambulatory Visit: Payer: Self-pay

## 2020-11-03 ENCOUNTER — Telehealth: Payer: Self-pay | Admitting: Licensed Clinical Social Worker

## 2020-11-03 MED ORDER — LOSARTAN POTASSIUM 25 MG PO TABS
25.0000 mg | ORAL_TABLET | Freq: Every day | ORAL | 11 refills | Status: DC
  Filled 2020-11-03: qty 30, 30d supply, fill #0

## 2020-11-03 NOTE — Telephone Encounter (Signed)
LCSW received additional call from pt/pt sister.  They share that Outpatient Rehab had called them and let them know that pt is scheduled for September 28th at 2pm to see a provider at the Outpatient Neuro Rehab office. Pt/pt sister aware of where that is and aware that pt can take a ride to that appt. They also let me know that [facility] Angelique Blonder) had come and changed pt vac this morning. I let them know that Jetta, CMA, from [facility] would speak with their [facility] coordinator and make sure that pt had enough vac supplies.  Pt sister also requests again the [facility] pharmacy number, I provided it again. Pt has two bottles of pills he is not sure what they are- encouraged him to bring them to pharmacy when he gets his refills and have them look at them. I remain available.   [name], MSW, LCSW [facility] Health Heart/Vascular Care Navigation  [phone]

## 2020-11-03 NOTE — Telephone Encounter (Signed)
LCSW received two calls before clinic had opened from pt.  When I returned his calls pt inquired if I had sent medications to pharmacy.  Reminded him that the [facility] and PCP offices are responsible for sending any refills to his preferred pharmacy which he was going to have as [facility]. Pt sister and pt recall this conversation and will reach out to [facility] pharmacy this morning to see if they are ready. Pt also noted that he is running out of vac dressing changes. LCSW has reached out to [facility] office, Jetta (CMA) to see if they can coordinate getting more dressing changes ordered for pt. I also have sent a message to Outpatient Rehab [address] office to f/u on referral since it has been about 2 weeks since referral sent by inpatient teams. Remain available.   [name], MSW, LCSW [facility] Health Heart/Vascular Care Navigation  [phone]

## 2020-11-04 ENCOUNTER — Other Ambulatory Visit: Payer: Self-pay

## 2020-11-06 ENCOUNTER — Ambulatory Visit (INDEPENDENT_AMBULATORY_CARE_PROVIDER_SITE_OTHER): Payer: Medicaid Other | Admitting: Physician Assistant

## 2020-11-06 ENCOUNTER — Other Ambulatory Visit: Payer: Self-pay

## 2020-11-06 VITALS — BP 151/100 | HR 101 | Temp 97.9°F | Resp 16 | Ht 67.0 in | Wt 150.0 lb

## 2020-11-06 DIAGNOSIS — I739 Peripheral vascular disease, unspecified: Secondary | ICD-10-CM

## 2020-11-06 DIAGNOSIS — S81802S Unspecified open wound, left lower leg, sequela: Secondary | ICD-10-CM

## 2020-11-06 NOTE — Progress Notes (Signed)
    Postoperative Visit    History of Present Illness   Benjamin [name] is a 60 y.o. 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 Myra Gianotti on 10/04/2020.  He has had a wound VAC on his left groin since surgery.  He states he removed his last wound VAC and does not have any more supplies.  He believes the groin incision is healing.  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   For VQI Use Only   PRE-ADM LIVING: Home  AMB STATUS: Wheelchair   Physical Examination   Vitals:   11/06/20 0945  BP: (!) 151/100  Pulse: (!) 101  Resp: 16  Temp: 97.9 F (36.6 C)  SpO2: 93%    LLE: Stump incision is healed. Staples are intact.  L groin pictured below      Medical Decision Making   Benjamin [name] is a 60 y.o. 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 Myra Gianotti on 10/04/2020  Left groin incision with small remaining open area.  We will discontinue wound VAC at this time.  He will perform dressing changes by himself at home.  I also encouraged the patient to improve his personal hygiene with at least daily cleansing with soap and water especially to the left groin.  Left above-the-knee amputation also appears healed.  Staples were removed in office today.  I will have the patient follow-up in 6 weeks to make sure the left groin incision has completely healed.  Emilie Rutter PA-C [facility] [place] Office: [phone]  Clinic MD: Karin Lieu

## 2020-11-09 ENCOUNTER — Telehealth: Payer: Self-pay

## 2020-11-09 ENCOUNTER — Telehealth: Payer: Self-pay | Admitting: Licensed Clinical Social Worker

## 2020-11-09 NOTE — Telephone Encounter (Signed)
Patient's sister calls today to ask about the wound vac. Advised it was discontinued. Patient should shower daily and keep wound clean and dry. She verbalized understanding.

## 2020-11-09 NOTE — Telephone Encounter (Signed)
Received a call from pt sister Benjamin [name]. She is inquiring if the home health nurse is supposed to be coming today. LCSW shared that I unfortunately am not privy to their schedules and inquired if she had the number that they were provided when home health nurse had previously come to their home. Benjamin [name] then mentions pt doesn't "have the bag on his leg anymore." LCSW inquired if she was referring to the wound vac. She confirmed. I requested that they first call the Vein and Vascular office to see if that was intended to be discontinued. If it has been the [facility] may no longer come as regularly for changes. Pt sister will call me back if any additional questions.   Benjamin [name], MSW, LCSW [facility] Health Heart/Vascular Care Navigation  [phone]

## 2020-11-10 ENCOUNTER — Other Ambulatory Visit: Payer: Self-pay

## 2020-11-10 ENCOUNTER — Encounter: Payer: Self-pay | Admitting: Physician Assistant

## 2020-11-10 ENCOUNTER — Ambulatory Visit (INDEPENDENT_AMBULATORY_CARE_PROVIDER_SITE_OTHER): Payer: Medicaid Other | Admitting: Physician Assistant

## 2020-11-10 ENCOUNTER — Telehealth: Payer: Self-pay | Admitting: *Deleted

## 2020-11-10 VITALS — BP 134/81 | HR 101 | Temp 98.6°F | Resp 20 | Ht 67.0 in

## 2020-11-10 DIAGNOSIS — I739 Peripheral vascular disease, unspecified: Secondary | ICD-10-CM

## 2020-11-10 DIAGNOSIS — S81802S Unspecified open wound, left lower leg, sequela: Secondary | ICD-10-CM

## 2020-11-10 MED ORDER — SULFAMETHOXAZOLE-TRIMETHOPRIM 400-80 MG PO TABS
1.0000 | ORAL_TABLET | Freq: Two times a day (BID) | ORAL | 0 refills | Status: AC
  Filled 2020-11-10: qty 28, 14d supply, fill #0

## 2020-11-10 NOTE — Telephone Encounter (Signed)
Angelique Blonder, nurse with [facility] called stating that patient's stump was swollen, warm to touch and has an opening in incision line that is draining "bloody fluid".  An appointment was scheduled for today at 2:15.

## 2020-11-10 NOTE — Progress Notes (Signed)
POST OPERATIVE OFFICE NOTE    CC:  F/u for surgery  HPI:  This is a 60 y.o. male who is s/p removal of infected left Pham pop bypass graft with redo of left femoral artery exposure and placement of antibiotic beads, left AKA by Dr. Trula Slade on 10/04/2020.  Wound VAC has been discontinued of his left groin.  He has a small open area remaining which is dressed with wet-to-dry dressing changes at least daily.  He was seen in the office on 11/06/2020 and had staple removal from left above-the-knee amputation.  He returns today with bloody drainage from mid incision.  He denies any fevers, chills, nausea/vomiting.  He continues to take Bactrim.  Allergies  Allergen Reactions   Aspirin Nausea And Vomiting   Garlic Other (See Comments)    sneezing    Current Outpatient Medications  Medication Sig Dispense Refill   acetaminophen (TYLENOL) 500 MG tablet Take 2 tablets (1,000 mg total) by mouth every 6 (six) hours as needed for mild pain or headache. (Patient taking differently: Take 500 mg by mouth 3 (three) times daily.) 100 tablet 0   atorvastatin (LIPITOR) 20 MG tablet Take 1 tablet (20 mg total) by mouth daily. 90 tablet 3   buPROPion (WELLBUTRIN XL) 150 MG 24 hr tablet TAKE 1 TABLET (150 MG TOTAL) BY MOUTH EVERY MORNING. 90 tablet 1   ceFAZolin (ANCEF) IVPB Inject 2 g into the vein every 8 (eight) hours. Indication:  Bypass graft infection First Dose: No Last Day of Therapy: 10/17/20 Labs - Once weekly:  CBC/D and BMP, Labs - Every other week:  ESR and CRP Method of administration: IV Push Method of administration may be changed at the discretion of home infusion pharmacist based upon assessment of the patient and/or caregiver's ability to self-administer the medication ordered. 126 Units 0   clopidogrel (PLAVIX) 75 MG tablet Take 1 tablet (75 mg total) by mouth daily. 90 tablet 3   gabapentin (NEURONTIN) 400 MG capsule Take 1 capsule (400 mg total) by mouth 2 (two) times daily. 60 capsule 5    gabapentin (NEURONTIN) 400 MG capsule Take one capsule by mouth in the morning, and two capsules by mouth at bedtime. 90 capsule 3   Homeopathic Products (LEG CRAMPS PM SL) Place 2 tablets under the tongue at bedtime.     lactose free nutrition (BOOST) LIQD Take 237 mLs by mouth daily.     losartan (COZAAR) 25 MG tablet Take 1 tablet (25 mg total) by mouth daily. 30 tablet 11   OVER THE COUNTER MEDICATION Take 1 capsule by mouth daily. Stacker energy pill     oxyCODONE-acetaminophen (PERCOCET/ROXICET) 5-325 MG tablet Take 1 tablet by mouth every 6 (six) hours as needed. 30 tablet 0   sulfamethoxazole-trimethoprim (BACTRIM) 400-80 MG tablet Take 1 tablet by mouth 2 (two) times daily for 14 days. 28 tablet 0   Vitamin D, Ergocalciferol, (DRISDOL) 1.25 MG (50000 UNIT) CAPS capsule Take 1 capsule (50,000 Units total) by mouth 2 (two) times a week. 24 capsule 0   No current facility-administered medications for this visit.     ROS:  See HPI  Physical Exam:  Vitals:   11/10/20 1436  BP: 134/81  Pulse: (!) 101  Resp: 20  Temp: 98.6 F (37 C)  TempSrc: Temporal  SpO2: 96%  Height: 5' 7"  (1.702 m)    Incision: No significant dehiscence from mid AKA incision; no tunneling noted on exam; no purulence or sign of infection currently Extremities: Left  groin incision also with healthy wound base and no tunnel Neuro: A&O Abdomen:  soft  Assessment/Plan:  This is a 60 y.o. male who is s/p: Removal of infected left lower extremity bypass and above-the-knee amputation  -Minimal dehiscence from mid AKA incision.  Recommendations included cleansing at least twice daily with soap and water and applying a dry dressing -He will continue current wound care of wet-to-dry dressing changes twice daily in his left groin.  I also encouraged him to cleanse this area with soap and water at least daily -He will continue his Bactrim prescription for an additional 2 weeks -Patient will follow up as previously  scheduled on 18 October to recheck left groin and left AKA incision.  We will postpone his prosthetic fitting given the above findings   Dagoberto Ligas, PA-C [facility] [phone]  Clinic MD:  Stanford Breed

## 2020-11-13 ENCOUNTER — Telehealth: Payer: Self-pay | Admitting: Licensed Clinical Social Worker

## 2020-11-13 NOTE — Telephone Encounter (Signed)
LCSW submitted request for pt to go to Outpatient PT evaluation at [facility] on [address]  Wednesday September 28 2:00 PM Outpt Rehabilitation [facility] [address]  [id number] [facility]  [place] [address] [phone]   Will f/u to remind pt next week.   [name], MSW, LCSW [facility] Health Heart/Vascular Care Navigation  [phone]

## 2020-11-16 ENCOUNTER — Telehealth: Payer: Self-pay | Admitting: Licensed Clinical Social Worker

## 2020-11-16 NOTE — Telephone Encounter (Signed)
LCSW received call from pt sister [name] [phone]). She shares that pt is doing well, was able to go to church this weekend. Inquires if ride is scheduled for OP Rehab appt this Wednesday. I confirmed it has been scheduled and reminded her that they would call to f/u with them tomorrow. Pt will need to call [phone] if he does not get a confirmation call. I also encouraged pt sister to accompany pt to appt if able or send another family member. I think that having the extra set of ears and person to take notes during the appt may assist pt with comprehension/recall as these appointments are the foundation for assisting pt with getting prosthetic. Pt sister shares she will consider but doesn't like doctors so may not go.   Encouraged pt/pt sister to call me back if any additional questions/concerns.    Benjamin [name], MSW, LCSW [facility] Health Heart/Vascular Care Navigation  [phone]

## 2020-11-17 ENCOUNTER — Ambulatory Visit (INDEPENDENT_AMBULATORY_CARE_PROVIDER_SITE_OTHER): Payer: Medicaid Other | Admitting: Physician Assistant

## 2020-11-17 ENCOUNTER — Other Ambulatory Visit: Payer: Self-pay

## 2020-11-17 ENCOUNTER — Telehealth: Payer: Self-pay | Admitting: Licensed Clinical Social Worker

## 2020-11-17 VITALS — BP 159/89 | HR 116 | Temp 98.6°F | Resp 20 | Ht 67.0 in | Wt 145.0 lb

## 2020-11-17 DIAGNOSIS — S81802S Unspecified open wound, left lower leg, sequela: Secondary | ICD-10-CM

## 2020-11-17 DIAGNOSIS — Z9889 Other specified postprocedural states: Secondary | ICD-10-CM

## 2020-11-17 MED ORDER — TRAMADOL HCL 50 MG PO TABS: 50.0000 mg | ORAL_TABLET | Freq: Two times a day (BID) | ORAL | 0 refills | Status: DC | PRN

## 2020-11-17 NOTE — Telephone Encounter (Signed)
Angelique Blonder from [facility] calls today to report that patient is running a low grade fever, stump is swollen and painful and has copious amounts of bloody drainage. She poked it with a q tip and it went in 7 cm. Patient scheduled for appt.

## 2020-11-17 NOTE — Telephone Encounter (Signed)
Pt sister [name] called me to let me know that pt had been sent by his [facility] to [facility] for a wound check b/c there was a "hole that was draining blood". Pt sister inquiring if she should cancel/reschedule his appt and ride for tomorrow. LCSW has reached out to [facility] staff Trinna Post, RN. If he needs to reschedule/cancel I will try and assist pt/pt family with doing so.   [name], MSW, LCSW [facility] Health Heart/Vascular Care Navigation  [phone]

## 2020-11-17 NOTE — Telephone Encounter (Signed)
LCSW returned call to pt sister and let her know that I received a message back from Trinna Post, [place], that pt can attend his therapy session tomorrow as planned. Ride had been scheduled. [name] is aware and I encouraged her to remind pt to let his PT know that he had been to appt today. No additional questions at this time.   [name], MSW, LCSW [facility] Health Heart/Vascular Care Navigation  [phone]

## 2020-11-17 NOTE — Progress Notes (Signed)
POST OPERATIVE OFFICE NOTE    CC:  F/u for surgery  HPI:  This is a 60 y.o. male who is s/p s/p removal of infected left fem pop bypass graft with redo of left femoral artery exposure and placement of antibiotic beads, left AKA by Dr. Trula Slade on 10/04/2020.  At the time of surgery on 10/04/2020, there was marginally viable muscle at the amputation site.  There was purulence around the anastomosis proximally in the groin.  The left limb of the ABF gras was also not incorporated.  It was ligated high under the inguinal ligament where it was incorporated.  There was no blood flow in the common femoral artery.  Antibiotic impregnated beads were placed at the amputation site, and the femoral-popliteal graft tunnel, and in the groin.    Pt was seen one week ago in our office and his wound vac had been discontinued and he had a small open area remaining, which was dressed with wet to dry dressing changes at least daily.  His staples were removed from his left AKA on 11/06/2020.  He had bloody drainage from the AKA mid incision.  He was not having any fever or chills  and was continued on Bactrim.  Pt returns today for follow up.  Pt states he has been having some bloody drainage from his stump.  He states that after the staples were removed, it opened up.  He denies any fevers.  He has his gabapentin but states he hasn't been taking it.  He says his groin incision has healed.    Allergies  Allergen Reactions   Aspirin Nausea And Vomiting   Garlic Other (See Comments)    sneezing    Current Outpatient Medications  Medication Sig Dispense Refill   acetaminophen (TYLENOL) 500 MG tablet Take 2 tablets (1,000 mg total) by mouth every 6 (six) hours as needed for mild pain or headache. (Patient taking differently: Take 500 mg by mouth 3 (three) times daily.) 100 tablet 0   atorvastatin (LIPITOR) 20 MG tablet Take 1 tablet (20 mg total) by mouth daily. 90 tablet 3   buPROPion (WELLBUTRIN XL) 150 MG 24 hr  tablet TAKE 1 TABLET (150 MG TOTAL) BY MOUTH EVERY MORNING. 90 tablet 1   ceFAZolin (ANCEF) IVPB Inject 2 g into the vein every 8 (eight) hours. Indication:  Bypass graft infection First Dose: No Last Day of Therapy: 10/17/20 Labs - Once weekly:  CBC/D and BMP, Labs - Every other week:  ESR and CRP Method of administration: IV Push Method of administration may be changed at the discretion of home infusion pharmacist based upon assessment of the patient and/or caregiver's ability to self-administer the medication ordered. 126 Units 0   clopidogrel (PLAVIX) 75 MG tablet Take 1 tablet (75 mg total) by mouth daily. 90 tablet 3   gabapentin (NEURONTIN) 400 MG capsule Take 1 capsule (400 mg total) by mouth 2 (two) times daily. 60 capsule 5   gabapentin (NEURONTIN) 400 MG capsule Take one capsule by mouth in the morning, and two capsules by mouth at bedtime. 90 capsule 3   Homeopathic Products (LEG CRAMPS PM SL) Place 2 tablets under the tongue at bedtime.     lactose free nutrition (BOOST) LIQD Take 237 mLs by mouth daily.     losartan (COZAAR) 25 MG tablet Take 1 tablet (25 mg total) by mouth daily. 30 tablet 11   OVER THE COUNTER MEDICATION Take 1 capsule by mouth daily. Stacker energy pill  oxyCODONE-acetaminophen (PERCOCET/ROXICET) 5-325 MG tablet Take 1 tablet by mouth every 6 (six) hours as needed. 30 tablet 0   sulfamethoxazole-trimethoprim (BACTRIM) 400-80 MG tablet Take 1 tablet by mouth 2 (two) times daily for 14 days. 28 tablet 0   Vitamin D, Ergocalciferol, (DRISDOL) 1.25 MG (50000 UNIT) CAPS capsule Take 1 capsule (50,000 Units total) by mouth 2 (two) times a week. 24 capsule 0   No current facility-administered medications for this visit.     ROS:  See HPI  Physical Exam:  Today's Vitals   11/17/20 1113  BP: (!) 159/89  Pulse: (!) 116  Resp: 20  Temp: 98.6 F (37 C)  TempSrc: Temporal  SpO2: 98%  Weight: 145 lb (65.8 kg)  Height: _0  (1.702 m)  PainSc: 5    Body  mass index is 22.71 kg/m.   Incision:       Extremities:  the area lateral on the incision was probed with a cotton tip applicator to about 3cm.  There is no purulence, just old hematoma.  The central area was in tact and no tract present.     Assessment/Plan:  This is a 60 y.o. 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 Trula Slade on 10/04/2020.  At the time of surgery on 10/04/2020, there was marginally viable muscle at the amputation site.  There was purulence around the anastomosis proximally in the groin.  The left limb of the ABF gras was also not incorporated.  It was ligated high under the inguinal ligament where it was incorporated.  There was no blood flow in the common femoral artery.  Antibiotic impregnated beads were placed at the amputation site, and the femoral-popliteal graft tunnel, and in the groin.  -pt returns today for wound check with c/o bloody drainage.  The area laterally on the AKA stump tracts about 3cm.  There is old hematoma but nothing that appears infectious.  He denies any fevers.  This was inspected with Dr. Stanford Breed today.  Pt has [place] nurse coming out once a week.  We will try to get this changed to at least 3x/week.  I have packed some Iodoform gauze into this area and left a tail.  Instructed pt to remove this on Thursday if [facility] has not come back out.  He did express understanding.  Would not want to I&D/revise this if we can help it due to high risk of not healing.   -pt will return on Monday for re-evaluation when Dr. Trula Slade is here.   -pt was prescribed tramadol 33m q12h prn pain #10 no ref[facility]  Pt instructed to continue his plavix.  -discussed smoking cessation as this would help with wound healing   SLeontine Locket [facility] [phone]  Clinic MD:  pt seen with Dr. HStanford Breed

## 2020-11-17 NOTE — Telephone Encounter (Signed)
Received another call from pt sister [name]. She states that the [facility] said that she didn't think the pt should go to appt tomorrow. I shared that the RN at [facility] had cleared him to go to the appt. I encouraged pt sister again that I think someone should go to the appt with pt and that he should also share with PT about his visit today and indicate any recommendations made by [facility] office today. I also have sent a message to PT letting her know [facility] office gave permission for pt to go and also let her know about HHRN comments. I shared w/ [name] that PT may do their evaluation and recommend that pt delay further treatment but I leave that up to their discretion.   [name], MSW, LCSW [facility] Heart/Vascular Care Navigation  [phone]

## 2020-11-18 ENCOUNTER — Other Ambulatory Visit: Payer: Self-pay

## 2020-11-18 ENCOUNTER — Ambulatory Visit: Payer: Medicaid Other | Attending: Physician Assistant

## 2020-11-18 DIAGNOSIS — R293 Abnormal posture: Secondary | ICD-10-CM | POA: Insufficient documentation

## 2020-11-18 MED ORDER — LOSARTAN POTASSIUM 25 MG PO TABS
25.0000 mg | ORAL_TABLET | Freq: Every day | ORAL | 1 refills | Status: DC
  Filled 2020-11-18 – 2020-11-25 (×2): qty 90, 90d supply, fill #0

## 2020-11-18 NOTE — Therapy (Signed)
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 Pt still has draining wound at distal femur that vascular surgeon is treating and has home health nurse assisting with wound care as well. Pt has not been fit for prosthesis yet due to this. PT advised we hold off on PT to conserve visits at this time for when he is able to obtain prosthesis. Pt currently ambulated in to session hopping on walker and transferring well. PT did advise to continue to work on stretching for left hip extension in standing with hip extension and spending some time in prone if tolerated with towel under femur to stretch. Also instructed pt to ask vascular doctor when would be ok to get shrinker to help with swelling and to perpare for prosthesis. Answered patient's questions about prosthetic process. No eval completed at this time.  Ronn Melena, PT, DPT, NCS 11/18/2020, 2:35 PM  [facility] [address] [place], [place], [address] Phone: [phone]   Fax:  [phone]

## 2020-11-19 ENCOUNTER — Telehealth: Payer: Self-pay | Admitting: Licensed Clinical Social Worker

## 2020-11-19 NOTE — Telephone Encounter (Signed)
See updated progress note.

## 2020-11-19 NOTE — Progress Notes (Signed)
Heart and Vascular Care Navigation  11/19/2020  Benjamin [name] February 18, 1961 [id number]  Reason for Referral:    Engaged with patient by telephone  (pt sister [facility], [place] on file) for follow up visit for Heart and Vascular Care Coordination.                                                                                                   Assessment:      LCSW reached out to pt at home phone [phone]. [name], pt sister, answered phone. I shared that pt should call transportation services tomorrow if he needs a ride to appointment on Monday as I will be out of the office tomorrow and appt is first thing Monday morning. [name] shares pt has been driving and likely would drive himself to the appt but if not she would let him know. I shared that I was also aware PT recommended that pt be re-referred once he has had further healing on his incision site in order to get the max benefit from therapy sessions once fitted for prosthetic. I encouraged [name] and Danish to inquire about this during his appt on the 24th of October to ensure it still is a consideration. Pt sister shares she will do so, I also will reach out closer to that time. I remain available.              HRT/VAS Care Coordination     Patients Home Cardiology Office [facility]   Outpatient Care Team Social Worker   Social Worker Name: Nile Riggs, [place] [facility] [phone]   Living arrangements for the past 2 months Single Family Home   Lives with: Relatives; Siblings   Patient Current Insurance Coverage Medicaid   Patient Has Concern With Paying Medical Bills No  now recieves disability   Patient Concerns With Medical Bills past hospitalizations/procedures   Medical Bill Referrals: pt currently Medicaid pending   Does Patient Have Prescription Coverage? Yes   Patient Prescription Assistance Programs Other   [place] Medassist Medications mailed pt NCMedAssist application and information about [facility]   Other Assistance  Programs Medications encouraged pt to continue using [facility] pharmacy and to bring Medicaid cards   Home Assistive Devices/Equipment Wheelchair; Environmental consultant (specify type)   DME Agency [employer]   [facility]   Current home services DME  RW, W/C       Social History:                                                                             SDOH Screenings   Alcohol Screen: Not on file  Depression (PHQ2-9): Low Risk    PHQ-2 Score: 0  Financial Resource Strain: Low Risk    Difficulty of Paying Living Expenses: Not very hard  Food Insecurity: No  Food Insecurity   Worried About Programme researcher, broadcasting/film/video in the Last Year: Never true   Ran Out of Food in the Last Year: Never true  Housing: Low Risk    Last Housing Risk Score: 0  Physical Activity: Not on file  Social Connections: Not on file  Stress: Not on file  Tobacco Use: High Risk   Smoking Tobacco Use: Every Day   Smokeless Tobacco Use: Never  Transportation Needs: No Transportation Needs   Lack of Transportation (Medical): No   Lack of Transportation (Non-Medical): No    SDOH Interventions: Financial Resources:  Corporate treasurer Interventions: Intervention Not Indicated  Food Insecurity:  Food Insecurity Interventions: Intervention Not Indicated  Housing Insecurity:  Housing Interventions: Intervention Not Indicated  Transportation:   Transportation Interventions: Intervention Not Indicated, Retail banker (enrolled in Higher education careers adviser; eligible for Medicaid transportation)     Other Care Navigation Interventions:     Provided Pharmacy assistance resources  Encouraged to utilize [facility] pharmacy   Follow-up plan:   Pt now has been enrolled in Managed Medicaid plan, he has started receiving disability income and is able to [employer] as needed. His sister and he are able to make household ends meet and have food in the home. They have my number and will reach out  as needed. I will plan on f/u after visit to [facility] on 10/24 to check in on ability for that office to re-refer to therapy.

## 2020-11-20 ENCOUNTER — Encounter: Payer: Self-pay | Admitting: Nurse Practitioner

## 2020-11-23 ENCOUNTER — Ambulatory Visit (INDEPENDENT_AMBULATORY_CARE_PROVIDER_SITE_OTHER): Payer: Self-pay | Admitting: Physician Assistant

## 2020-11-23 ENCOUNTER — Telehealth: Payer: Self-pay

## 2020-11-23 ENCOUNTER — Telehealth: Payer: Self-pay | Admitting: Licensed Clinical Social Worker

## 2020-11-23 ENCOUNTER — Other Ambulatory Visit: Payer: Self-pay

## 2020-11-23 VITALS — BP 156/104 | HR 110 | Resp 16 | Ht 67.0 in | Wt 150.0 lb

## 2020-11-23 DIAGNOSIS — Z89612 Acquired absence of left leg above knee: Secondary | ICD-10-CM

## 2020-11-23 NOTE — Progress Notes (Signed)
POST OPERATIVE OFFICE NOTE    CC:  F/u for surgery  HPI:  This is a 60 y.o. 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 Trula Slade on 10/04/2020.  At the time of surgery on 10/04/2020, there was marginally viable muscle at the amputation site.  There was purulence around the anastomosis proximally in the groin.  The left limb of the ABF gras was also not incorporated.  It was ligated high under the inguinal ligament where it was incorporated.  There was no blood flow in the common femoral artery.  Antibiotic impregnated beads were placed at the amputation site, and the femoral-popliteal graft tunnel, and in the groin.    Pt was seen on 11/10/2020 in our office and his wound vac had been discontinued and he had a small open area remaining, which was dressed with wet to dry dressing changes at least daily.  His staples were removed from his left AKA on 11/06/2020.  He had bloody drainage from the AKA mid incision.  He was not having any fever or chills  and was continued on Bactrim.    He was last seen on 11/17/2020 and at that time, he was having some bloody drainage from the medial aspect of the left AKA stump.  It did not appear to be infected.  Iodoform gauze was packed in the wound with a long tail and HH to be set up for 3x/week.   He was given some Tramadol q12h prn #10 no refills.  He was instructed to continue his Plavix.  Pt returns today for follow up.  Pt states the oozing from the stump has slowed down.  He states he has been doing well since his last visit.  He pulled the iodoform wick out and has been putting dressings over the area (not packing b/c he cannot see it to do it).    Allergies  Allergen Reactions   Aspirin Nausea And Vomiting   Garlic Other (See Comments)    sneezing    Current Outpatient Medications  Medication Sig Dispense Refill   acetaminophen (TYLENOL)  500 MG tablet Take 2 tablets (1,000 mg total) by mouth every 6 (six) hours as needed for mild pain or headache. (Patient taking differently: Take 500 mg by mouth 3 (three) times daily.) 100 tablet 0   atorvastatin (LIPITOR) 20 MG tablet Take 1 tablet (20 mg total) by mouth daily. 90 tablet 3   buPROPion (WELLBUTRIN XL) 150 MG 24 hr tablet TAKE 1 TABLET (150 MG TOTAL) BY MOUTH EVERY MORNING. 90 tablet 1   ceFAZolin (ANCEF) IVPB Inject 2 g into the vein every 8 (eight) hours. Indication:  Bypass graft infection First Dose: No Last Day of Therapy: 10/17/20 Labs - Once weekly:  CBC/D and BMP, Labs - Every other week:  ESR and CRP Method of administration: IV Push Method of administration may be changed at the discretion of home infusion pharmacist based upon assessment of the patient and/or caregiver's ability to self-administer the medication ordered. 126 Units 0   clopidogrel (PLAVIX) 75 MG tablet Take 1 tablet (75 mg total) by mouth daily. 90 tablet 3   gabapentin (NEURONTIN) 400 MG capsule Take 1 capsule (400 mg total) by mouth 2 (two) times daily. 60 capsule 5   gabapentin (NEURONTIN) 400 MG capsule Take one capsule by mouth in the morning, and two capsules by mouth  at bedtime. 90 capsule 3   Homeopathic Products (LEG CRAMPS PM SL) Place 2 tablets under the tongue at bedtime.     lactose free nutrition (BOOST) LIQD Take 237 mLs by mouth daily.     losartan (COZAAR) 25 MG tablet Take 1 tablet (25 mg total) by mouth daily. 90 tablet 1   OVER THE COUNTER MEDICATION Take 1 capsule by mouth daily. Stacker energy pill     sulfamethoxazole-trimethoprim (BACTRIM) 400-80 MG tablet Take 1 tablet by mouth 2 (two) times daily for 14 days. 28 tablet 0   traMADol (ULTRAM) 50 MG tablet Take 1 tablet (50 mg total) by mouth every 12 (twelve) hours as needed. 10 tablet 0   Vitamin D, Ergocalciferol, (DRISDOL) 1.25 MG (50000 UNIT) CAPS capsule Take 1 capsule (50,000 Units total) by mouth 2 (two) times a week. 24  capsule 0   No current facility-administered medications for this visit.     ROS:  See HPI  Physical Exam:  Today's Vitals   11/23/20 1011  BP: (!) 156/104  Pulse: (!) 110  Resp: 16  SpO2: 97%  Weight: 150 lb (68 kg)  Height: 5' 7"  (1.702 m)  PainSc: 3    Body mass index is 23.49 kg/m.   Incision:  the lateral wound is slightly bigger today.  There is some hematoma within this wound but no evidence of infection.  His left groin still looks good and healing nicely.    Assessment/Plan:  This is a 60 y.o. 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 Trula Slade on 10/04/2020.  At the time of surgery on 10/04/2020, there was marginally viable muscle at the amputation site.  There was purulence around the anastomosis proximally in the groin.  The left limb of the ABF gras was also not incorporated.  It was ligated high under the inguinal ligament where it was incorporated.  There was no blood flow in the common femoral artery.  Antibiotic impregnated beads were placed at the amputation site, and the femoral-popliteal graft tunnel, and in the groin.  Left groin incision at last visit was healed without evidence of infection. -the medial wound on the stump was examined with Dr. Trula Slade and packed with one 4x4.  We will do dressing changes 3x/week with HH.  Would prefer to do daily, however, HH will come 3x/week.  Will use aquacel packing of the wound.  He will come back in one week for wound check.     Leontine Locket, [facility] [phone]   Clinic MD:  Trula Slade

## 2020-11-23 NOTE — Telephone Encounter (Signed)
Per Dr. Myra Gianotti and Doreatha Massed, PA, A verbal order was called in to [facility] for Aqua-Cel packing of 4 cm deep stump wound x 3 days a week.  Patient is aware of new order.

## 2020-11-23 NOTE — Telephone Encounter (Signed)
LCSW received call from pt sister Benjamin [name], [place] on file. She shares that pt went to office this morning for dressing change, has another visit scheduled 10/10. Dr. Estanislado Spire office has sent in orders for more regular dressing changes if able.  Pt sister has been made aware that orders were placed for more frequent visits by Frances Furbish, I did share that visits may be limited by Medicaid only coverage. She was encouraged to call [facility] and make her aware of new orders. If any issues I requested [name] call [facility] office and make them aware. I remain available as needed also.   [name], MSW, LCSW [facility] Heart/Vascular Care Navigation  [phone]

## 2020-11-25 ENCOUNTER — Other Ambulatory Visit: Payer: Self-pay

## 2020-11-27 ENCOUNTER — Telehealth: Payer: Self-pay | Admitting: Licensed Clinical Social Worker

## 2020-11-27 ENCOUNTER — Telehealth: Payer: Self-pay

## 2020-11-27 NOTE — Telephone Encounter (Signed)
Angelique Blonder from [facility] calls to report that patient now has 3 open areas on the stump and she thinks "they are communicating behind." Patient to be evaluated on Monday 11/30/20.

## 2020-11-27 NOTE — Telephone Encounter (Signed)
LCSW received a call from pt sister [name]- she shares that [facility] has instructed pt not to drive to appt on Monday. Confirmed she has Administrator, arts, she will call me if any questions or concerns arise.  [name], MSW, LCSW [facility] Heart/Vascular Care Navigation  [phone]

## 2020-11-30 ENCOUNTER — Ambulatory Visit (INDEPENDENT_AMBULATORY_CARE_PROVIDER_SITE_OTHER): Payer: Medicaid Other | Admitting: Physician Assistant

## 2020-11-30 ENCOUNTER — Other Ambulatory Visit: Payer: Self-pay

## 2020-11-30 ENCOUNTER — Encounter: Payer: Self-pay | Admitting: Physician Assistant

## 2020-11-30 ENCOUNTER — Encounter (HOSPITAL_COMMUNITY): Payer: Self-pay | Admitting: Vascular Surgery

## 2020-11-30 VITALS — BP 145/76 | HR 106 | Temp 98.4°F | Resp 16 | Ht 67.0 in | Wt 145.0 lb

## 2020-11-30 DIAGNOSIS — Z89612 Acquired absence of left leg above knee: Secondary | ICD-10-CM

## 2020-11-30 DIAGNOSIS — S81802S Unspecified open wound, left lower leg, sequela: Secondary | ICD-10-CM

## 2020-11-30 DIAGNOSIS — I739 Peripheral vascular disease, unspecified: Secondary | ICD-10-CM

## 2020-11-30 DIAGNOSIS — T8131XD Disruption of external operation (surgical) wound, not elsewhere classified, subsequent encounter: Secondary | ICD-10-CM

## 2020-11-30 NOTE — Progress Notes (Signed)
POST OPERATIVE OFFICE NOTE    CC:  F/u for surgery  HPI:  This is a 60 y.o. male who is s/p left AKA on 10/04/2020.  He presents for wound follow-up. He denies fever or chills.  He states he has discomfort when transferring and at nighttime.  He states home health was out yesterday to change the packing.  They are supposed to be seeing him 3 times a week.  He is compliant with his statin and Plavix.  Allergies  Allergen Reactions   Aspirin Nausea And Vomiting   Garlic Other (See Comments)    sneezing    Current Outpatient Medications  Medication Sig Dispense Refill   acetaminophen (TYLENOL) 500 MG tablet Take 2 tablets (1,000 mg total) by mouth every 6 (six) hours as needed for mild pain or headache. (Patient taking differently: Take 500 mg by mouth 3 (three) times daily.) 100 tablet 0   atorvastatin (LIPITOR) 20 MG tablet Take 1 tablet (20 mg total) by mouth daily. 90 tablet 3   buPROPion (WELLBUTRIN XL) 150 MG 24 hr tablet TAKE 1 TABLET (150 MG TOTAL) BY MOUTH EVERY MORNING. 90 tablet 1   ceFAZolin (ANCEF) IVPB Inject 2 g into the vein every 8 (eight) hours. Indication:  Bypass graft infection First Dose: No Last Day of Therapy: 10/17/20 Labs - Once weekly:  CBC/D and BMP, Labs - Every other week:  ESR and CRP Method of administration: IV Push Method of administration may be changed at the discretion of home infusion pharmacist based upon assessment of the patient and/or caregiver's ability to self-administer the medication ordered. (Patient taking differently: Inject 2 g into the vein every 8 (eight) hours. Indication:  Bypass graft infection First Dose: No Last Day of Therapy: 10/17/20 Labs - Once weekly:  CBC/D and BMP, Labs - Every other week:  ESR and CRP Method of administration: IV Push Method of administration may be changed at the discretion of home infusion pharmacist based upon assessment of the patient and/or caregiver's ability to self-administer the medication  ordered.) 126 Units 0   clopidogrel (PLAVIX) 75 MG tablet Take 1 tablet (75 mg total) by mouth daily. 90 tablet 3   gabapentin (NEURONTIN) 400 MG capsule Take 1 capsule (400 mg total) by mouth 2 (two) times daily. 60 capsule 5   gabapentin (NEURONTIN) 400 MG capsule Take one capsule by mouth in the morning, and two capsules by mouth at bedtime. 90 capsule 3   Homeopathic Products (LEG CRAMPS PM SL) Place 2 tablets under the tongue at bedtime.     lactose free nutrition (BOOST) LIQD Take 237 mLs by mouth daily.     losartan (COZAAR) 25 MG tablet Take 1 tablet (25 mg total) by mouth daily. 90 tablet 1   OVER THE COUNTER MEDICATION Take 1 capsule by mouth daily. Stacker energy pill     traMADol (ULTRAM) 50 MG tablet Take 1 tablet (50 mg total) by mouth every 12 (twelve) hours as needed. 10 tablet 0   Vitamin D, Ergocalciferol, (DRISDOL) 1.25 MG (50000 UNIT) CAPS capsule Take 1 capsule (50,000 Units total) by mouth 2 (two) times a week. 24 capsule 0   No current facility-administered medications for this visit.     ROS:  See HPI  BP (!) 145/76 (BP Location: Right Arm, Patient Position: Sitting, Cuff Size: Normal)   Pulse (!) 106   Temp 98.4 F (36.9 C) (Temporal)   Resp 16   Ht 5' 7" (1.702 m)   Wt 145   lb (65.8 kg) Comment: Patient reported  SpO2 96%   BMI 22.71 kg/m   Physical Exam:  Vitals:   11/30/20 1100  BP: (!) 145/76  Pulse: (!) 106  Resp: 16  Temp: 98.4 F (36.9 C)  SpO2: 96%    General appearance: Awake, alert in no apparent distress Cardiac: Heart rate and rhythm are regular Respirations: Nonlabored Extremities: Examination of his left above-knee amputation site reveals cloudy bloody drainage and dehiscence of suture line.  Wound is approximately 4.5 centimeters in depth.  Assessment/Plan:  This is a 60 y.o. male who is s/p: Left above-knee amputation approxi-2 months ago.  Wound is not responding to local care.  We will make arrangements for wound washout and wound  VAC placement.  He is in agreement with this plan.   Isiaah Cuervo, PA-C [facility] [phone]  Clinic MD: Dr. Brabham  

## 2020-11-30 NOTE — Progress Notes (Addendum)
Mr. Kail denies chest pain or shortness of breath. Patient denies having any s/s of Covid in his household.  Patient denies any known exposure to Covid.   PCP Is Arnette Felts, FNP .  I instructed patient to shower with antibiotic soap, if it is available.  Dry off with a clean towel. Do not put lotion, powder, cologne or deodorant or makeup.No jewelry or piercings. Men may shave their face and neck. Woman should not shave. No nail polish, artificial or acrylic nails. Wear clean clothes, brush your teeth. Glasses, contact lens,dentures or partials may not be worn in the OR. If you need to wear them, please bring a case for glasses, do not wear contacts or bring a case, the hospital does not have contact cases, dentures or partials will have to be removed , make sure they are clean, we will provide a denture cup to put them in. You will need some one to drive you home and a responsible person over the age of 77 to stay with you for the first 24 hours after surgery.

## 2020-11-30 NOTE — H&P (View-Only) (Signed)
POST OPERATIVE OFFICE NOTE    CC:  F/u for surgery  HPI:  This is a 60 y.o. male who is s/p left AKA on 10/04/2020.  He presents for wound follow-up. He denies fever or chills.  He states he has discomfort when transferring and at nighttime.  He states home health was out yesterday to change the packing.  They are supposed to be seeing him 3 times a week.  He is compliant with his statin and Plavix.  Allergies  Allergen Reactions   Aspirin Nausea And Vomiting   Garlic Other (See Comments)    sneezing    Current Outpatient Medications  Medication Sig Dispense Refill   acetaminophen (TYLENOL) 500 MG tablet Take 2 tablets (1,000 mg total) by mouth every 6 (six) hours as needed for mild pain or headache. (Patient taking differently: Take 500 mg by mouth 3 (three) times daily.) 100 tablet 0   atorvastatin (LIPITOR) 20 MG tablet Take 1 tablet (20 mg total) by mouth daily. 90 tablet 3   buPROPion (WELLBUTRIN XL) 150 MG 24 hr tablet TAKE 1 TABLET (150 MG TOTAL) BY MOUTH EVERY MORNING. 90 tablet 1   ceFAZolin (ANCEF) IVPB Inject 2 g into the vein every 8 (eight) hours. Indication:  Bypass graft infection First Dose: No Last Day of Therapy: 10/17/20 Labs - Once weekly:  CBC/D and BMP, Labs - Every other week:  ESR and CRP Method of administration: IV Push Method of administration may be changed at the discretion of home infusion pharmacist based upon assessment of the patient and/or caregiver's ability to self-administer the medication ordered. (Patient taking differently: Inject 2 g into the vein every 8 (eight) hours. Indication:  Bypass graft infection First Dose: No Last Day of Therapy: 10/17/20 Labs - Once weekly:  CBC/D and BMP, Labs - Every other week:  ESR and CRP Method of administration: IV Push Method of administration may be changed at the discretion of home infusion pharmacist based upon assessment of the patient and/or caregiver's ability to self-administer the medication  ordered.) 126 Units 0   clopidogrel (PLAVIX) 75 MG tablet Take 1 tablet (75 mg total) by mouth daily. 90 tablet 3   gabapentin (NEURONTIN) 400 MG capsule Take 1 capsule (400 mg total) by mouth 2 (two) times daily. 60 capsule 5   gabapentin (NEURONTIN) 400 MG capsule Take one capsule by mouth in the morning, and two capsules by mouth at bedtime. 90 capsule 3   Homeopathic Products (LEG CRAMPS PM SL) Place 2 tablets under the tongue at bedtime.     lactose free nutrition (BOOST) LIQD Take 237 mLs by mouth daily.     losartan (COZAAR) 25 MG tablet Take 1 tablet (25 mg total) by mouth daily. 90 tablet 1   OVER THE COUNTER MEDICATION Take 1 capsule by mouth daily. Stacker energy pill     traMADol (ULTRAM) 50 MG tablet Take 1 tablet (50 mg total) by mouth every 12 (twelve) hours as needed. 10 tablet 0   Vitamin D, Ergocalciferol, (DRISDOL) 1.25 MG (50000 UNIT) CAPS capsule Take 1 capsule (50,000 Units total) by mouth 2 (two) times a week. 24 capsule 0   No current facility-administered medications for this visit.     ROS:  See HPI  BP (!) 145/76 (BP Location: Right Arm, Patient Position: Sitting, Cuff Size: Normal)   Pulse (!) 106   Temp 98.4 F (36.9 C) (Temporal)   Resp 16   Ht _0  (1.702 m)   Wt 145  lb (65.8 kg) Comment: Patient reported  SpO2 96%   BMI 22.71 kg/m   Physical Exam:  Vitals:   11/30/20 1100  BP: (!) 145/76  Pulse: (!) 106  Resp: 16  Temp: 98.4 F (36.9 C)  SpO2: 96%    General appearance: Awake, alert in no apparent distress Cardiac: Heart rate and rhythm are regular Respirations: Nonlabored Extremities: Examination of his left above-knee amputation site reveals cloudy bloody drainage and dehiscence of suture line.  Wound is approximately 4.5 centimeters in depth.  Assessment/Plan:  This is a 60 y.o. male who is s/p: Left above-knee amputation approxi-2 months ago.  Wound is not responding to local care.  We will make arrangements for wound washout and wound  VAC placement.  He is in agreement with this plan.   Risa Grill, PA-C [facility] [phone]  Clinic MD: Dr. Trula Slade

## 2020-12-01 ENCOUNTER — Encounter (HOSPITAL_COMMUNITY): Payer: Self-pay | Admitting: Certified Registered"

## 2020-12-01 ENCOUNTER — Other Ambulatory Visit: Payer: Self-pay

## 2020-12-01 ENCOUNTER — Encounter (HOSPITAL_COMMUNITY): Payer: Self-pay | Admitting: Vascular Surgery

## 2020-12-01 ENCOUNTER — Ambulatory Visit (HOSPITAL_COMMUNITY)
Admission: RE | Admit: 2020-12-01 | Discharge: 2020-12-01 | Disposition: A | Discharge status: Home / Self Care | Payer: Medicaid Other | Attending: Vascular Surgery | Admitting: Vascular Surgery

## 2020-12-01 ENCOUNTER — Encounter (HOSPITAL_COMMUNITY)
Admission: RE | Disposition: A | Discharge status: Home / Self Care | Payer: Self-pay | Source: Home / Self Care | Attending: Vascular Surgery

## 2020-12-01 ENCOUNTER — Telehealth: Payer: Self-pay | Admitting: Licensed Clinical Social Worker

## 2020-12-01 DIAGNOSIS — Z7902 Long term (current) use of antithrombotics/antiplatelets: Secondary | ICD-10-CM | POA: Diagnosis not present

## 2020-12-01 DIAGNOSIS — Z79899 Other long term (current) drug therapy: Secondary | ICD-10-CM | POA: Insufficient documentation

## 2020-12-01 DIAGNOSIS — T8781 Dehiscence of amputation stump: Secondary | ICD-10-CM | POA: Diagnosis not present

## 2020-12-01 DIAGNOSIS — Z5329 Procedure and treatment not carried out because of patient's decision for other reasons: Secondary | ICD-10-CM | POA: Insufficient documentation

## 2020-12-01 DIAGNOSIS — Z89612 Acquired absence of left leg above knee: Secondary | ICD-10-CM | POA: Diagnosis not present

## 2020-12-01 DIAGNOSIS — Z20822 Contact with and (suspected) exposure to covid-19: Secondary | ICD-10-CM | POA: Insufficient documentation

## 2020-12-01 LAB — POCT I-STAT, CHEM 8
BUN: 17 mg/dL (ref 8–23)
Calcium, Ion: 1.22 mmol/L (ref 1.15–1.40)
Chloride: 107 mmol/L (ref 98–111)
Creatinine, Ser: 0.6 mg/dL — ABNORMAL LOW (ref 0.61–1.24)
Glucose, Bld: 109 mg/dL — ABNORMAL HIGH (ref 70–99)
HCT: 30 % — ABNORMAL LOW (ref 39.0–52.0)
Hemoglobin: 10.2 g/dL — ABNORMAL LOW (ref 13.0–17.0)
Potassium: 4.1 mmol/L (ref 3.5–5.1)
Sodium: 136 mmol/L (ref 135–145)
TCO2: 21 mmol/L — ABNORMAL LOW (ref 22–32)

## 2020-12-01 LAB — SARS CORONAVIRUS 2 BY RT PCR (HOSPITAL ORDER, PERFORMED IN ~~LOC~~ HOSPITAL LAB): SARS Coronavirus 2: NEGATIVE

## 2020-12-01 SURGERY — INCISION AND DRAINAGE
Anesthesia: Choice | Laterality: Left

## 2020-12-01 MED ORDER — CHLORHEXIDINE GLUCONATE 0.12 % MT SOLN
15.0000 mL | Freq: Once | OROMUCOSAL | Status: AC
  Administered 2020-12-01: 15 mL via OROMUCOSAL
  Filled 2020-12-01: qty 15

## 2020-12-01 MED ORDER — CEFAZOLIN SODIUM-DEXTROSE 2-4 GM/100ML-% IV SOLN
2.0000 g | INTRAVENOUS | Status: DC
  Filled 2020-12-01: qty 100

## 2020-12-01 MED ORDER — ORAL CARE MOUTH RINSE: 15.0000 mL | Freq: Once | OROMUCOSAL | Status: AC

## 2020-12-01 MED ORDER — CHLORHEXIDINE GLUCONATE 4 % EX LIQD: 60.0000 mL | Freq: Once | CUTANEOUS | Status: DC

## 2020-12-01 MED ORDER — FENTANYL CITRATE (PF) 250 MCG/5ML IJ SOLN
INTRAMUSCULAR | Status: AC
  Filled 2020-12-01: qty 5

## 2020-12-01 MED ORDER — PROPOFOL 10 MG/ML IV BOLUS
INTRAVENOUS | Status: AC
  Filled 2020-12-01: qty 40

## 2020-12-01 MED ORDER — SODIUM CHLORIDE 0.9 % IV SOLN: INTRAVENOUS | Status: DC

## 2020-12-01 MED ORDER — MIDAZOLAM HCL 2 MG/2ML IJ SOLN
INTRAMUSCULAR | Status: AC
  Filled 2020-12-01: qty 2

## 2020-12-01 NOTE — Progress Notes (Signed)
    Patient presented today for I&D of left above-knee amputation nonhealing surgical wound.  I evaluated the wound at bedside and he appeared to have purulence and likely liquefied muscle or hematoma draining from the previous staple line where the staples had been removed with approximately 2 cm defect and also draining from the medial leg from what appears to be a previous saphenectomy site.  There is quite a bit of tension in the midportion of the above-knee amputation site from the bone.  Most of the skin of the above-knee amputation does appear viable however there is significant edema distally and appears most of the previous staple line has dehisced in the deep space and is close to dehiscing at the level of the skin.  I discussed with the patient that he does need opening of this amputation site, likely taking back more of the bone probable debridement of muscle, skin and subcutaneous tissue with possible closure with revision to a higher amputation site versus significant washout and placement of wound VAC at least temporarily.  After my first in-depth conversation with the patient he agreed to surgery I was then called back because he was not amenable to surgery.  At this time patient was asking for prolonged antibiotics and for me to staple the wounds closed versus placing a wound VAC at the bedside there.  I discussed with the patient that I do not think any of these are viable options and that there does appear to be necrotic tissue in the deep space that needs to be debrided.  Patient is most worried about not having a prosthetic in the near future.  I discussed with him at this time we need to focus on getting him through this alive and that a prosthetic can be measured for an amputation site once we get it to heal.  Patient was initially again agreeable to surgery but when I left the room he again stated that he was going to leave.  I discussed with the nurse on the telephone and communicated that  this would be AGAINST MEDICAL ADVICE and he was agreeable to sign the papers.  I will not make follow-up appointment with him in our office as he is leaving against my advice despite significant conversations and him demonstrating good understanding of the risks.  Keimora Swartout C. Randie Heinz, MD

## 2020-12-01 NOTE — Telephone Encounter (Signed)
Received a call from pt sister [facility] (DPR on file).  [name] shares that she is aware that pt had gone to hospital for planned procedure and is now declining to have that procedure as he is concerned about not being able to get a prosthetic and wants a second opinion. I provided verbal support for pt sister- shared that in my opinion it is important (as has been shared multiple times before) that someone from pt family, friends, or faith community should perhaps accompany him to appts so that he has support and someone to discuss next steps/recommendations with after appt.   Pt sister avoidant to this suggestion- stating that she is stressed, has to leave it up to the pt to make the decision, and feels she has to "leave it up to God." I encouraged her to think and pray on it if she chooses and then to find a family member/friend/faith figure that could provide further support. We discussed that there may come a time where pt could possibly need support with making decisions and it is important to understand what he wants. I shared that there are vascular surgeons at [facility] and [facility]; should they desire a second opinion that they would need to call pt insurance and see if they are in network and arrange an appt.   Unfortunately per notes, pt has discharged AMA from hospital w/ no vascular f/u b/c of this decision which limits heart/vascular care navigation ongoing support. I remain available as able moving forward.   [name], MSW, LCSW [facility] Heart/Vascular Care Navigation  [phone]

## 2020-12-01 NOTE — Progress Notes (Signed)
Procedure cancelled.  Patient requesting a second opinion.  OR notified.  Dr. Randie Heinz aware.

## 2020-12-01 NOTE — Interval H&P Note (Signed)
History and Physical Interval Note:  12/01/2020 7:51 AM  Benjamin [name]  has presented today for surgery, with the diagnosis of POOR HEALING LEFT ABOVE KNEE AMPUTATION.  The various methods of treatment have been discussed with the patient and family. After consideration of risks, benefits and other options for treatment, the patient has consented to  Procedure(s): INCISION AND DRAINAGE OF LEFT ABOVE KNEE AMPUTATION (Left) APPLICATION OF WOUND VAC (Left) possible revision of left above-knee amputation as a surgical intervention.  The patient's history has been reviewed, patient examined, no change in status, stable for surgery.  I have reviewed the patient's chart and labs.  Questions were answered to the patient's satisfaction.  Unfortunately it does appear that there may be underlying necrotic muscle that is necessitating out the distal incision as well as a medial leg incision.  This may require conversion to higher amputation.  Patient is very nervous about this is was hopeful to go home today but I think will need significant revision possible wound VAC placement and will require inpatient admission.   [name]

## 2020-12-01 NOTE — Anesthesia Preprocedure Evaluation (Deleted)
Anesthesia Evaluation  Patient identified by MRN, date of birth, ID band Patient awake    Reviewed: Allergy & Precautions, NPO status , Patient's Chart, lab work & pertinent test results  History of Anesthesia Complications Negative for: history of anesthetic complications  Airway Mallampati: I  TM Distance: >3 FB Neck ROM: Full    Dental  (+) Edentulous Upper, Edentulous Lower   Pulmonary Current Smoker,    Pulmonary exam normal        Cardiovascular hypertension, + Peripheral Vascular Disease  Normal cardiovascular exam     Neuro/Psych negative neurological ROS     GI/Hepatic negative GI ROS, Neg liver ROS,   Endo/Other  negative endocrine ROS  Renal/GU negative Renal ROS     Musculoskeletal negative musculoskeletal ROS (+)   Abdominal   Peds  Hematology Plavix   Anesthesia Other Findings   Reproductive/Obstetrics (+) Pregnancy No obstetric history on file. at Unknown                             Anesthesia Physical Anesthesia Plan  ASA: 3  Anesthesia Plan: General   Post-op Pain Management:    Induction: Intravenous  PONV Risk Score and Plan: 1 and Ondansetron, Dexamethasone, Midazolam and Treatment may vary due to age or medical condition  Airway Management Planned: LMA  Additional Equipment: None  Intra-op Plan:   Post-operative Plan: Extubation in OR  Informed Consent: I have reviewed the patients History and Physical, chart, labs and discussed the procedure including the risks, benefits and alternatives for the proposed anesthesia with the patient or authorized representative who has indicated his/her understanding and acceptance.     Dental advisory given  Plan Discussed with:   Anesthesia Plan Comments:         Anesthesia Quick Evaluation

## 2020-12-01 NOTE — Progress Notes (Signed)
Procedure cancelled and Dr. Randie Heinz aware. Patient states "he doesn't want more of his leg cut off because he won't be able to get a prosthetic". Dr. Randie Heinz at bedside and educated patient of benefits of surgery and risks of not having surgery. Patient is not ready to have surgery done today. He was strongly encouraged to seek a second opinion from another surgeon immediately since he was going home today and refusing surgery at this time.

## 2020-12-02 ENCOUNTER — Other Ambulatory Visit: Payer: Self-pay

## 2020-12-02 ENCOUNTER — Telehealth: Payer: Self-pay | Admitting: Licensed Clinical Social Worker

## 2020-12-02 DIAGNOSIS — Z89612 Acquired absence of left leg above knee: Secondary | ICD-10-CM

## 2020-12-02 DIAGNOSIS — T8130XD Disruption of wound, unspecified, subsequent encounter: Secondary | ICD-10-CM

## 2020-12-02 DIAGNOSIS — I739 Peripheral vascular disease, unspecified: Secondary | ICD-10-CM

## 2020-12-02 NOTE — Telephone Encounter (Signed)
LCSW received a call this morning from [facility], pt sister. She states HHRN was supposed to come this morning but hasnt yet- I shared that she should first call the [facility] or [name] directly and see if there is any issue as to why she has not come. I also gave her the heads up that b/c pt left AMA yesterday that it may effect whether or not the [facility] agency can continue to come. I stressed that if Frances [facility]ish needs further orders for care that they would need to come from the primary care doctor's office until they are able to connect with another vascular surgeon. Remain available as needed.  [name], MSW, LCSW [facility] Heart/Vascular Care Navigation  [phone]

## 2020-12-04 ENCOUNTER — Telehealth: Payer: Self-pay

## 2020-12-04 NOTE — Telephone Encounter (Addendum)
Received call from patient's sister. Patient would like to make another appointment to discuss surgery with a lady from his church. Spoke with surgeon, patient does not need appt, patient should report to the emergency room. Strongly advised reporting to the emergency room, patient's sister is concerned that patient will have to wait. Explained that he would likely have a wait time, but to bring anyone that he needed with him and that he needed surgery and should not leave AMA.   Received call from [place] with [facility]. Patient's wound is worse and she is sending him to the ED. He left the hospital AMA 3 days ago without getting scheduled surgery. Informed Dr. Randie Heinz who was to do previous surgery - he will discuss with on call MD.

## 2020-12-07 ENCOUNTER — Telehealth: Payer: Self-pay | Admitting: Licensed Clinical Social Worker

## 2020-12-07 ENCOUNTER — Other Ambulatory Visit: Payer: Self-pay

## 2020-12-07 ENCOUNTER — Emergency Department (HOSPITAL_COMMUNITY): Payer: Medicaid Other

## 2020-12-07 ENCOUNTER — Encounter (HOSPITAL_COMMUNITY): Payer: Self-pay

## 2020-12-07 ENCOUNTER — Inpatient Hospital Stay (HOSPITAL_COMMUNITY)
Admission: EM | Admit: 2020-12-07 | Discharge: 2020-12-15 | DRG: 474 | Disposition: A | Discharge status: Rehabilitation Facility | Payer: Medicaid Other | Attending: Internal Medicine | Admitting: Internal Medicine

## 2020-12-07 DIAGNOSIS — Z820 Family history of epilepsy and other diseases of the nervous system: Secondary | ICD-10-CM

## 2020-12-07 DIAGNOSIS — Z886 Allergy status to analgesic agent status: Secondary | ICD-10-CM | POA: Diagnosis not present

## 2020-12-07 DIAGNOSIS — M549 Dorsalgia, unspecified: Secondary | ICD-10-CM | POA: Diagnosis present

## 2020-12-07 DIAGNOSIS — R7303 Prediabetes: Secondary | ICD-10-CM | POA: Diagnosis present

## 2020-12-07 DIAGNOSIS — Z91018 Allergy to other foods: Secondary | ICD-10-CM

## 2020-12-07 DIAGNOSIS — Z7902 Long term (current) use of antithrombotics/antiplatelets: Secondary | ICD-10-CM | POA: Diagnosis not present

## 2020-12-07 DIAGNOSIS — Z1624 Resistance to multiple antibiotics: Secondary | ICD-10-CM | POA: Diagnosis present

## 2020-12-07 DIAGNOSIS — Z79899 Other long term (current) drug therapy: Secondary | ICD-10-CM | POA: Diagnosis not present

## 2020-12-07 DIAGNOSIS — E785 Hyperlipidemia, unspecified: Secondary | ICD-10-CM | POA: Diagnosis present

## 2020-12-07 DIAGNOSIS — E43 Unspecified severe protein-calorie malnutrition: Secondary | ICD-10-CM | POA: Diagnosis present

## 2020-12-07 DIAGNOSIS — F17210 Nicotine dependence, cigarettes, uncomplicated: Secondary | ICD-10-CM | POA: Diagnosis present

## 2020-12-07 DIAGNOSIS — T148XXA Other injury of unspecified body region, initial encounter: Secondary | ICD-10-CM | POA: Diagnosis present

## 2020-12-07 DIAGNOSIS — S78112A Complete traumatic amputation at level between left hip and knee, initial encounter: Secondary | ICD-10-CM | POA: Diagnosis not present

## 2020-12-07 DIAGNOSIS — Z20822 Contact with and (suspected) exposure to covid-19: Secondary | ICD-10-CM | POA: Diagnosis present

## 2020-12-07 DIAGNOSIS — L0390 Cellulitis, unspecified: Secondary | ICD-10-CM | POA: Diagnosis not present

## 2020-12-07 DIAGNOSIS — D62 Acute posthemorrhagic anemia: Secondary | ICD-10-CM | POA: Diagnosis not present

## 2020-12-07 DIAGNOSIS — T879 Unspecified complications of amputation stump: Secondary | ICD-10-CM

## 2020-12-07 DIAGNOSIS — Z8261 Family history of arthritis: Secondary | ICD-10-CM

## 2020-12-07 DIAGNOSIS — Z823 Family history of stroke: Secondary | ICD-10-CM | POA: Diagnosis not present

## 2020-12-07 DIAGNOSIS — L089 Local infection of the skin and subcutaneous tissue, unspecified: Principal | ICD-10-CM

## 2020-12-07 DIAGNOSIS — K219 Gastro-esophageal reflux disease without esophagitis: Secondary | ICD-10-CM | POA: Diagnosis present

## 2020-12-07 DIAGNOSIS — B961 Klebsiella pneumoniae [K. pneumoniae] as the cause of diseases classified elsewhere: Secondary | ICD-10-CM | POA: Diagnosis present

## 2020-12-07 DIAGNOSIS — E871 Hypo-osmolality and hyponatremia: Secondary | ICD-10-CM | POA: Diagnosis not present

## 2020-12-07 DIAGNOSIS — B952 Enterococcus as the cause of diseases classified elsewhere: Secondary | ICD-10-CM | POA: Diagnosis present

## 2020-12-07 DIAGNOSIS — I70262 Atherosclerosis of native arteries of extremities with gangrene, left leg: Secondary | ICD-10-CM | POA: Diagnosis present

## 2020-12-07 DIAGNOSIS — I10 Essential (primary) hypertension: Secondary | ICD-10-CM | POA: Diagnosis present

## 2020-12-07 DIAGNOSIS — Z825 Family history of asthma and other chronic lower respiratory diseases: Secondary | ICD-10-CM

## 2020-12-07 DIAGNOSIS — T8744 Infection of amputation stump, left lower extremity: Secondary | ICD-10-CM | POA: Diagnosis present

## 2020-12-07 DIAGNOSIS — T8789 Other complications of amputation stump: Secondary | ICD-10-CM | POA: Diagnosis not present

## 2020-12-07 DIAGNOSIS — G546 Phantom limb syndrome with pain: Secondary | ICD-10-CM | POA: Diagnosis not present

## 2020-12-07 DIAGNOSIS — Y835 Amputation of limb(s) as the cause of abnormal reaction of the patient, or of later complication, without mention of misadventure at the time of the procedure: Secondary | ICD-10-CM | POA: Diagnosis present

## 2020-12-07 LAB — CBC WITH DIFFERENTIAL/PLATELET
Abs Immature Granulocytes: 0.05 10*3/uL (ref 0.00–0.07)
Basophils Absolute: 0 10*3/uL (ref 0.0–0.1)
Basophils Relative: 0 %
Eosinophils Absolute: 0 10*3/uL (ref 0.0–0.5)
Eosinophils Relative: 0 %
HCT: 27.6 % — ABNORMAL LOW (ref 39.0–52.0)
Hemoglobin: 9.3 g/dL — ABNORMAL LOW (ref 13.0–17.0)
Immature Granulocytes: 1 %
Lymphocytes Relative: 9 %
Lymphs Abs: 1 10*3/uL (ref 0.7–4.0)
MCH: 25.4 pg — ABNORMAL LOW (ref 26.0–34.0)
MCHC: 33.7 g/dL (ref 30.0–36.0)
MCV: 75.4 fL — ABNORMAL LOW (ref 80.0–100.0)
Monocytes Absolute: 0.5 10*3/uL (ref 0.1–1.0)
Monocytes Relative: 4 %
Neutro Abs: 9.2 10*3/uL — ABNORMAL HIGH (ref 1.7–7.7)
Neutrophils Relative %: 86 %
Platelets: 621 10*3/uL — ABNORMAL HIGH (ref 150–400)
RBC: 3.66 MIL/uL — ABNORMAL LOW (ref 4.22–5.81)
RDW: 21.6 % — ABNORMAL HIGH (ref 11.5–15.5)
WBC: 10.7 10*3/uL — ABNORMAL HIGH (ref 4.0–10.5)
nRBC: 0 % (ref 0.0–0.2)

## 2020-12-07 LAB — URINALYSIS, ROUTINE W REFLEX MICROSCOPIC
Bilirubin Urine: NEGATIVE
Glucose, UA: NEGATIVE mg/dL
Hgb urine dipstick: NEGATIVE
Ketones, ur: NEGATIVE mg/dL
Leukocytes,Ua: NEGATIVE
Nitrite: NEGATIVE
Protein, ur: NEGATIVE mg/dL
Specific Gravity, Urine: 1.013 (ref 1.005–1.030)
pH: 6 (ref 5.0–8.0)

## 2020-12-07 LAB — COMPREHENSIVE METABOLIC PANEL
ALT: 22 U/L (ref 0–44)
AST: 18 U/L (ref 15–41)
Albumin: 2.6 g/dL — ABNORMAL LOW (ref 3.5–5.0)
Alkaline Phosphatase: 130 U/L — ABNORMAL HIGH (ref 38–126)
Anion gap: 8 (ref 5–15)
BUN: 11 mg/dL (ref 8–23)
CO2: 25 mmol/L (ref 22–32)
Calcium: 8.5 mg/dL — ABNORMAL LOW (ref 8.9–10.3)
Chloride: 105 mmol/L (ref 98–111)
Creatinine, Ser: 0.64 mg/dL (ref 0.61–1.24)
GFR, Estimated: >60 mL/min (ref >60)
Glucose, Bld: 108 mg/dL — ABNORMAL HIGH (ref 70–99)
Potassium: 3.9 mmol/L (ref 3.5–5.1)
Sodium: 138 mmol/L (ref 135–145)
Total Bilirubin: 0.2 mg/dL — ABNORMAL LOW (ref 0.3–1.2)
Total Protein: 6.2 g/dL — ABNORMAL LOW (ref 6.5–8.1)

## 2020-12-07 LAB — PROTIME-INR
INR: 1.1 (ref 0.8–1.2)
Prothrombin Time: 13.8 seconds (ref 11.4–15.2)

## 2020-12-07 LAB — LACTIC ACID, PLASMA: Lactic Acid, Venous: 1.2 mmol/L (ref 0.5–1.9)

## 2020-12-07 LAB — RESP PANEL BY RT-PCR (FLU A&B, COVID) ARPGX2
Influenza A by PCR: NEGATIVE
Influenza B by PCR: NEGATIVE
SARS Coronavirus 2 by RT PCR: NEGATIVE

## 2020-12-07 MED ORDER — PIPERACILLIN-TAZOBACTAM 3.375 G IVPB
3.3750 g | Freq: Three times a day (TID) | INTRAVENOUS | Status: DC
  Administered 2020-12-07 – 2020-12-13 (×18): 3.375 g via INTRAVENOUS
  Filled 2020-12-07 (×18): qty 50

## 2020-12-07 MED ORDER — TRAMADOL HCL 50 MG PO TABS
50.0000 mg | ORAL_TABLET | Freq: Two times a day (BID) | ORAL | Status: DC | PRN
Start: 2020-12-07 — End: 2020-12-15
  Administered 2020-12-08 – 2020-12-15 (×8): 50 mg via ORAL
  Filled 2020-12-07 (×8): qty 1

## 2020-12-07 MED ORDER — NICOTINE 21 MG/24HR TD PT24
21.0000 mg | MEDICATED_PATCH | Freq: Every day | TRANSDERMAL | Status: DC
Start: 2020-12-07 — End: 2020-12-15
  Administered 2020-12-07 – 2020-12-15 (×8): 21 mg via TRANSDERMAL
  Filled 2020-12-07 (×8): qty 1

## 2020-12-07 MED ORDER — SENNA 8.6 MG PO TABS
1.0000 | ORAL_TABLET | Freq: Two times a day (BID) | ORAL | Status: DC
  Administered 2020-12-08 – 2020-12-15 (×13): 8.6 mg via ORAL
  Filled 2020-12-07 (×14): qty 1

## 2020-12-07 MED ORDER — BISACODYL 5 MG PO TBEC: 5.0000 mg | DELAYED_RELEASE_TABLET | Freq: Every day | ORAL | Status: DC | PRN

## 2020-12-07 MED ORDER — ALBUTEROL SULFATE (2.5 MG/3ML) 0.083% IN NEBU
2.5000 mg | INHALATION_SOLUTION | Freq: Four times a day (QID) | RESPIRATORY_TRACT | Status: DC
  Administered 2020-12-07 – 2020-12-08 (×4): 2.5 mg via RESPIRATORY_TRACT
  Filled 2020-12-07 (×4): qty 3

## 2020-12-07 MED ORDER — SODIUM CHLORIDE 0.9 % IV SOLN: INTRAVENOUS | Status: DC

## 2020-12-07 MED ORDER — ACETAMINOPHEN 650 MG RE SUPP: 650.0000 mg | Freq: Four times a day (QID) | RECTAL | Status: DC | PRN

## 2020-12-07 MED ORDER — MORPHINE SULFATE (PF) 2 MG/ML IV SOLN
2.0000 mg | INTRAVENOUS | Status: DC | PRN
  Administered 2020-12-08 – 2020-12-15 (×5): 2 mg via INTRAVENOUS
  Filled 2020-12-07 (×5): qty 1

## 2020-12-07 MED ORDER — ACETAMINOPHEN 325 MG PO TABS: 650.0000 mg | ORAL_TABLET | Freq: Four times a day (QID) | ORAL | Status: DC | PRN

## 2020-12-07 MED ORDER — ATORVASTATIN CALCIUM 10 MG PO TABS
20.0000 mg | ORAL_TABLET | Freq: Every day | ORAL | Status: DC
  Administered 2020-12-07 – 2020-12-15 (×8): 20 mg via ORAL
  Filled 2020-12-07 (×8): qty 2

## 2020-12-07 MED ORDER — HYDRALAZINE HCL 20 MG/ML IJ SOLN: 10.0000 mg | Freq: Four times a day (QID) | INTRAMUSCULAR | Status: DC | PRN

## 2020-12-07 MED ORDER — CLOPIDOGREL BISULFATE 75 MG PO TABS
75.0000 mg | ORAL_TABLET | Freq: Every day | ORAL | Status: DC
  Administered 2020-12-07 – 2020-12-15 (×8): 75 mg via ORAL
  Filled 2020-12-07 (×8): qty 1

## 2020-12-07 MED ORDER — HEPARIN SODIUM (PORCINE) 5000 UNIT/ML IJ SOLN
5000.0000 [IU] | Freq: Three times a day (TID) | INTRAMUSCULAR | Status: DC
  Administered 2020-12-08: 5000 [IU] via SUBCUTANEOUS
  Filled 2020-12-07: qty 1

## 2020-12-07 NOTE — H&P (View-Only) (Signed)
Hospital Consult    Reason for Consult:  necrotic left aka Referring Physician:  Dr. Pietro Cassis MRN #:  [id number]  History of Present Illness This is a 60 y.o. male with complicated past revascularization history.  Most recently he underwent left above-knee amputation with removal of graft and ligation of his common femoral artery.  Unfortunately his above-knee amputation has failed to heal, he was scheduled last week for revision versus debridement but after arriving for surgery he refused.  He subsequently returned home.  Our office was then contacted last Friday as the wound was worsening and home health was concerned.  He was again instructed to return to the emergency department for surgical evaluation.  He now presents today for further evaluation with worsening drainage.  He denies any fevers or chills.  He is hopeful to salvage his left above-knee amputation site.  He takes Plavix he does not take blood thinners.  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 No sig Dz in infrarenal Abd Aorta. Normal Bilat Iliac arteries. R SFA is 100% @ origin - recon in AK-Pop A. R PT A patent. L CFA occluded. L PFA recon @ origin, L SFA occluded w/ recon in AK Pop A.   ABDOMINAL AORTOGRAM W/LOWER EXTREMITY N/A 10/15/2019   Procedure: ABDOMINAL AORTOGRAM W/LOWER EXTREMITY;  Surgeon: Serafina Mitchell, MD;  Location: [place] [facility];  Service: Cardiovascular;  Laterality: N/A;   AMPUTATION Left 10/04/2020   Procedure: LEFT ABOVE KNEE AMPUTATION;   Surgeon: Serafina Mitchell, MD;  Location: [facility];  Service: Vascular;  Laterality: Left;   AORTA - BILATERAL FEMORAL ARTERY BYPASS GRAFT N/A 05/08/2020   Procedure: AORTA BIFEMORAL BYPASS GRAFT;  Surgeon: Serafina Mitchell, MD;  Location: [facility];  Service: Vascular;  Laterality: N/A;   COLONOSCOPY  06/2016   never   ENDARTERECTOMY FEMORAL Left 11/14/2019   Procedure: Left groin exploration, Redo left femoral artery exposure;  Surgeon: Serafina Mitchell, MD;  Location: [facility];  Service: Vascular;  Laterality: Left;   FEMORAL-FEMORAL BYPASS GRAFT N/A 11/13/2019   Procedure: BYPASS GRAFT FEMORAL-FEMORAL ARTERY RIGHT TO LEFT USING HEMASHIELD GOLD GRAFT 78m x 30cm;  Surgeon: BSerafina Mitchell MD;  [facility];  Service: Vascular;  Laterality: N/A;   FEMORAL-POPLITEAL BYPASS GRAFT Left 11/13/2019   Procedure: LEFT FEMORAL BELOW KNEE-POPLITEAL ARTERY USING NON-REVERSED GREATER SAPHENOUS VEIN;  Surgeon: BSerafina Mitchell MD;  Location: [facility];  Service: Vascular;  Laterality: Left;   FEMORAL-POPLITEAL BYPASS GRAFT Left 05/08/2020   Procedure: LEFT REDO BYPASS GRAFT COMMON FEMORAL- BELOW KNEE POPLITEAL ARTERY USING PROPATEN GRAFT;  Surgeon: BSerafina Mitchell MD;  Location: [facility];  Service: Vascular;  Laterality: Left;   I & D EXTREMITY Left 09/07/2020   Procedure: IRRIGATION AND DEBRIDEMENT LEFT LEG WITH EXCISION OF LEFT LEG DISTAL BYPASS GRAFT;  Surgeon: CMarty Heck MD;  [facility]  Service: Vascular;  Laterality: Left;   LOWER EXTREMITY ANGIOGRAM Left 11/14/2019   Procedure: LEFT LOWER EXTREMITY ANGIOGRAM, BYPASS GRAFT ANGIOPLASTY;  Surgeon: Serafina Mitchell, MD;  Location: [facility] OR;  Service: Vascular;  Laterality: Left;   NO PAST SURGERIES  06/2016   PERIPHERAL VASCULAR INTERVENTION  10/15/2019   Procedure: PERIPHERAL VASCULAR INTERVENTION;  Surgeon: Serafina Mitchell, MD;  Location: [place] [facility];  Service: Cardiovascular;;  Rt Iliac   REMOVAL OF GRAFT Left 10/04/2020   Procedure: REMOVAL OF  LEFT FEMORAL TO POPITEAL BYPASS GRAFT;  Surgeon: Serafina Mitchell, MD;  Location: [facility];  Service: Vascular;  Laterality: Left;    Allergies  Allergen Reactions   Aspirin Nausea And Vomiting   Garlic Other (See Comments)    sneezing    Prior to Admission medications   Medication Sig Start Date End Date Taking? Authorizing Provider  acetaminophen (TYLENOL) 500 MG tablet Take 2 tablets (1,000 mg total) by mouth every 6 (six) hours as needed for mild pain or headache. Patient taking differently: Take 1,500 mg by mouth 3 (three) times daily as needed for moderate pain. 05/15/20   Dagoberto Ligas, PA-C  atorvastatin (LIPITOR) 20 MG tablet Take 1 tablet (20 mg total) by mouth daily. 10/29/20   Serafina Mitchell, MD  buPROPion (WELLBUTRIN XL) 150 MG 24 hr tablet TAKE 1 TABLET (150 MG TOTAL) BY MOUTH EVERY MORNING. Patient not taking: Reported on 11/30/2020 07/14/20 07/14/21  Dagoberto Ligas, PA-C  ceFAZolin (ANCEF) IVPB Inject 2 g into the vein every 8 (eight) hours. Indication:  Bypass graft infection First Dose: No Last Day of Therapy: 10/17/20 Labs - Once weekly:  CBC/D and BMP, Labs - Every other week:  ESR and CRP Method of administration: IV Push Method of administration may be changed at the discretion of home infusion pharmacist based upon assessment of the patient and/or caregiver's ability to self-administer the medication ordered. Patient not taking: Reported on 11/30/2020 09/11/20   Barbie Banner, PA-C  clopidogrel (PLAVIX) 75 MG tablet Take 1 tablet (75 mg total) by mouth daily. 10/29/20   Serafina Mitchell, MD  gabapentin (NEURONTIN) 400 MG capsule Take one capsule by mouth in the morning, and two capsules by mouth at bedtime. 10/29/20   Rhyne, Hulen Shouts, PA-C  Ibuprofen-diphenhydrAMINE HCl (IBUPROFEN PM) 200-25 MG CAPS Take 3 tablets by mouth at bedtime as needed (sleep).    [provider]  lactose free nutrition (BOOST) LIQD Take 237 mLs by mouth daily.    [provider]  losartan (COZAAR) 25 MG tablet Take 1 tablet (25 mg total) by mouth daily. 11/18/20 11/18/21  [name], FNP  OVER THE COUNTER MEDICATION Take 1 capsule by mouth daily. Stacker energy pill    [provider]  phenylephrine (SUDAFED PE) 10 MG TABS tablet Take 10 mg by mouth every 4 (four) hours as needed (allergies).    [provider]  Sennosides (EX-LAX PO) Take 1 tablet by mouth daily as needed (constipation).    [provider]  traMADol (ULTRAM) 50 MG tablet Take 1 tablet (50 mg total) by mouth every 12 (twelve) hours as needed. Patient not taking: Reported on 11/30/2020 11/17/20   Gabriel Earing, PA-C  Vitamin D, Ergocalciferol, (DRISDOL) 1.25 MG (50000 UNIT) CAPS capsule Take 1 capsule (50,000 Units total) by mouth 2 (two) times a week. Patient not taking: Reported on 11/30/2020 08/17/20   [name], FNP    Social History   Socioeconomic History   Marital status: Single    Spouse name: Not on file   Number of children: Not on file   Years  of education: 12   Highest education level: Not on file  Occupational History   Not on file  Tobacco Use   Smoking status: Every Day    Packs/day: 2.00    Years: 29.00    Pack years: 58.00    Types: Cigarettes   Smokeless tobacco: Never   Tobacco comments:    he is down to 1 PPD - 07/22/2020  Vaping Use   Vaping Use: Never used  Substance and Sexual Activity   Alcohol use: No   Drug use: No   Sexual activity: Not Currently  Other Topics Concern   Not on file  Social History Narrative   He works as a Secondary school teacher at [employer].   He lives with his sister and her son.   Walks every day to work at least 15+ minutes.   Social Determinants of Health   Financial Resource Strain: Low Risk    Difficulty of Paying Living Expenses: Not very hard  Food Insecurity: No Food Insecurity   Worried About Charity fundraiser in the Last Year: Never true   Ran Out of Food in the Last Year:  Never true  Transportation Needs: No Transportation Needs   Lack of Transportation (Medical): No   Lack of Transportation (Non-Medical): No  Physical Activity: Not on file  Stress: Not on file  Social Connections: Not on file  Intimate Partner Violence: Not on file    Family History  Problem Relation Age of Onset   Arthritis Mother    COPD Father    Stroke Father    Multiple sclerosis Sister     ROS Left above-knee amputation site pain  Physical Examination  Vitals:   12/07/20 1500 12/07/20 1735  BP: (!) 153/98 (!) 166/93  Pulse: (!) 106 (!) 58  Resp: (!) 21 (!) 21  Temp:    SpO2: 99% 95%   Body mass index is 23.49 kg/m.  Physical Exam He is awake alert and oriented Nonlabored respirations Left above-knee amputation site has dehisced completely, there appears to be exposed bone, there is fibrinous exudate and what appears to be necrotic muscle draining from the previous staple line.  The medial leg has a previous saphenectomy site that is now eschar.  The leg is cold distally does have improved warmth and color about the mid thigh level.  CBC    Component Value Date/Time   WBC 10.7 (H) 12/07/2020 1441   RBC 3.66 (L) 12/07/2020 1441   HGB 9.3 (L) 12/07/2020 1441   HGB 12.2 (L) 07/22/2020 1150   HCT 27.6 (L) 12/07/2020 1441   HCT 37.4 (L) 07/22/2020 1150   PLT 621 (H) 12/07/2020 1441   PLT 419 07/22/2020 1150   MCV 75.4 (L) 12/07/2020 1441   MCV 76 (L) 07/22/2020 1150   MCH 25.4 (L) 12/07/2020 1441   MCHC 33.7 12/07/2020 1441   RDW 21.6 (H) 12/07/2020 1441   RDW 15.8 (H) 07/22/2020 1150   LYMPHSABS 1.0 12/07/2020 1441   MONOABS 0.5 12/07/2020 1441   EOSABS 0.0 12/07/2020 1441   BASOSABS 0.0 12/07/2020 1441    BMET    Component Value Date/Time   NA 138 12/07/2020 1441   NA 140 07/22/2020 1150   K 3.9 12/07/2020 1441   CL 105 12/07/2020 1441   CO2 25 12/07/2020 1441   GLUCOSE 108 (H) 12/07/2020 1441   BUN 11 12/07/2020 1441   BUN 14 07/22/2020 1150    CREATININE 0.64 12/07/2020 1441   CREATININE 1.02  01/20/2017 0908   CALCIUM 8.5 (L) 12/07/2020 1441   GFRNONAA >60 12/07/2020 1441   GFRAA >60 11/14/2019 0508    COAGS: Lab Results  Component Value Date   INR 1.1 12/07/2020   INR 1.1 10/01/2020   INR 1.2 05/08/2020     Non-Invasive Vascular Imaging:   No studies  ASSESSMENT/PLAN: This is a 60 y.o. male with necrotic left above-knee amputation site.  He will require at least revision of his amputation versus more likely conversion to higher above-knee amputation site.  I have again discussed this with him and he has multiple questions regarding future prosthetic use.  Patient is hopeful for antibiotics and for me to staple the wound closed.  I have again discussed with the patient that this is not a viable option as the wound is actually necrotic and not necessarily infected although antibiotics will be initiated.  He will require at least debridement more likely conversion to higher above-knee amputation site.  I discussed with him that we can consider a prosthetic once we get a limb to heal but at this time this is more of a life-threatening situation given that he may be at risk for hip disarticulation if we cannot get any above-knee amputation to heal given previous ligation of his common femoral artery.  Patient does not seem to grasp the gravity of the situation at the end of our talk is hopeful again for antibiotics and further stapling of his wound.  We have tentatively planned for the above-noted procedure tomorrow in the operating room.  I think that if he remains obstinate for surgery he may require palliative care consultation to determine goals of care.  Corynne Scibilia C. Donzetta Matters, MD [facility] Office: [phone] Pager: [phone]

## 2020-12-07 NOTE — ED Triage Notes (Signed)
Pt from home with ems, had a left BKA 3 weeks ago from blood clots. HHRN reported significant drainage and possible bone exposure.   170/108 HR 120 RR22 99% room air ET CO2 26-30 CBG 126 Temp 99.2  20G LFA

## 2020-12-07 NOTE — ED Notes (Signed)
Patient transported to x-ray. ?

## 2020-12-07 NOTE — Progress Notes (Signed)
Pharmacy Antibiotic Note  Benjamin [name] is a 60 y.o. male admitted on 12/07/2020 with  wound infection .  Pharmacy has been consulted for Zosyn dosing.  Patient presents 3 weeks s/p BKA with a wound with significant drainage and possible bone exposure. Patient is afebrile with WBC 10.7 and SCr 0.64.   Plan: Start Zosyn 3.375g q8h Monitor renal function, cx, and signs of improvement  Height: 5\' 7"  (170.2 cm) Weight: 68 kg (150 lb) IBW/kg (Calculated) : 66.1  Temp (24hrs), Avg:98.8 F (37.1 C), Min:98.8 F (37.1 C), Max:98.8 F (37.1 C)  Recent Labs  Lab 12/01/20 0750 12/07/20 1441  WBC  --  10.7*  CREATININE 0.60* 0.64  LATICACIDVEN  --  1.2    Estimated Creatinine Clearance: 90.7 mL/min (by C-G formula based on SCr of 0.64 mg/dL).    Allergies  Allergen Reactions   Aspirin Nausea And Vomiting   Garlic Other (See Comments)    sneezing    Antimicrobials this admission: Zosyn 10/17 >>   Microbiology results: 10/17 BCx: in process  Thank you for allowing pharmacy to be a part of this patient's care.  11/17 12/07/2020 5:15 PM

## 2020-12-07 NOTE — H&P (Signed)
[facility] History and Physical  Benjamin [name] DXA:[id number] DOB: [birth year] DOA: 12/07/2020 PCP: [name], FNP  Admitted from: Home Chief Complaint: Infected amputation stump  History of Present Illness: Benjamin [name] is a 60 y.o. male with PMH significant for HTN, HLD, heavy smoking, peripheral artery disease s/p bilateral femoral artery bypass graft who underwent left above-knee amputation, removal of infected left femoral-popliteal bypass graft, placement of antibiotic beads by vascular surgery on 10/04/2020 and was discharged to home with p.o. Bactrim for 6 weeks, oxycodone for pain as well as wound VAC and home health services.  Patient presented to the ED from home today with significant drainage from the amputation stump with possible bone exposure.  He has the symptoms ongoing for last several days.  He was seen earlier in the month by vascular surgeon Dr. Donzetta Matters with ongoing discussion about additional surgery, had amputation and further courses of antibiotics.  Patient was reluctant to come to the hospital despite multiple requests by family and home health RN.  In the ED, patient was afebrile, heart rate 120s, blood pressure 170/108, respiratory rate 22 on room air 99% O2 sat Labs with WBC 10.7, hemoglobin 9.3, platelets 621, BUN/creatinine 11/0.64, albumin 2.6, lactic acid 1.2 Left femur x-ray showed above-knee amputation status on the left without evidence of osteomyelitis but possible soft tissue infection. Vascular surgery consultation was obtained.  Tentatively plan for amputation tomorrow. Hospitalist service consulted for inpatient admission and management.  At the time of my evaluation, patient was sitting up in bed.  Not in distress.  No new symptoms.  He was asking if he should really go with another amputation.  He was wondering if wound VAC would be good enough.  I suggested him to discuss this question from vascular surgery.  Review of Systems:  All systems  were reviewed and were negative unless otherwise mentioned in the HPI   Past medical history: Past Medical History:  Diagnosis Date   Back pain    Heavy smoker    Hyperlipidemia LDL goal <70    Hypertension    Peripheral arterial occlusive disease ([place]) 06/2013   Bilateral femoral artery disease    Past surgical history: Past Surgical History:  Procedure Laterality Date   ABDOMINAL AORTOGRAM W/LOWER EXTREMITY N/A 07/12/2016   Procedure: Abdominal Aortogram w/Lower Extremity;  Surgeon: Serafina Mitchell, MD;  Location: [facility];  Service: Cardiovascular;  Laterality: N/A;  Bilateral extermity: Patent Renal As. No sig Dz in infrarenal Abd Aorta. Normal Bilat Iliac arteries. R SFA is 100% @ origin - recon in AK-Pop A. R PT A patent. L CFA occluded. L PFA recon @ origin, L SFA occluded w/ recon in AK Pop A.   ABDOMINAL AORTOGRAM W/LOWER EXTREMITY N/A 10/15/2019   Procedure: ABDOMINAL AORTOGRAM W/LOWER EXTREMITY;  Surgeon: Serafina Mitchell, MD;  Location: [facility];  Service: Cardiovascular;  Laterality: N/A;   AMPUTATION Left 10/04/2020   Procedure: LEFT ABOVE KNEE AMPUTATION;  Surgeon: Serafina Mitchell, MD;  [facility]: [facility];  Service: Vascular;  Laterality: Left;   AORTA - BILATERAL FEMORAL ARTERY BYPASS GRAFT N/A 05/08/2020   Procedure: AORTA BIFEMORAL BYPASS GRAFT;  Surgeon: Serafina Mitchell, MD;  Location: [facility];  Service: Vascular;  Laterality: N/A;   COLONOSCOPY  06/2016   never   ENDARTERECTOMY FEMORAL Left 11/14/2019   Procedure: Left groin exploration, Redo left femoral artery exposure;  Surgeon: Serafina Mitchell, MD;  Location: [facility];  Service: Vascular;  Laterality:  Left;   FEMORAL-FEMORAL BYPASS GRAFT N/A 11/13/2019   Procedure: BYPASS GRAFT FEMORAL-FEMORAL ARTERY RIGHT TO LEFT USING HEMASHIELD GOLD GRAFT 11m x 30cm;  Surgeon: BSerafina Mitchell MD;  [facility];  Service: Vascular;  Laterality: N/A;   FEMORAL-POPLITEAL BYPASS GRAFT Left 11/13/2019   Procedure:  LEFT FEMORAL BELOW KNEE-POPLITEAL ARTERY USING NON-REVERSED GREATER SAPHENOUS VEIN;  Surgeon: BSerafina Mitchell MD;  [facility];  Service: Vascular;  Laterality: Left;   FEMORAL-POPLITEAL BYPASS GRAFT Left 05/08/2020   Procedure: LEFT REDO BYPASS GRAFT COMMON FEMORAL- BELOW KNEE POPLITEAL ARTERY USING PROPATEN GRAFT;  Surgeon: BSerafina Mitchell MD;  [facility];  Service: Vascular;  Laterality: Left;   I & D EXTREMITY Left 09/07/2020   Procedure: IRRIGATION AND DEBRIDEMENT LEFT LEG WITH EXCISION OF LEFT LEG DISTAL BYPASS GRAFT;  Surgeon: CMarty Heck MD;  [facility]  Service: Vascular;  Laterality: Left;   LOWER EXTREMITY ANGIOGRAM Left 11/14/2019   Procedure: LEFT LOWER EXTREMITY ANGIOGRAM, BYPASS GRAFT ANGIOPLASTY;  Surgeon: BSerafina Mitchell MD;  [facility];  Service: Vascular;  Laterality: Left;   NO PAST SURGERIES  06/2016   PERIPHERAL VASCULAR INTERVENTION  10/15/2019   Procedure: PERIPHERAL VASCULAR INTERVENTION;  Surgeon: BSerafina Mitchell MD;  [facility];  Service: Cardiovascular;;  Rt Iliac   REMOVAL OF GRAFT Left 10/04/2020   Procedure: REMOVAL OF LEFT FEMORAL TO POPITEAL BYPASS GRAFT;  Surgeon: BSerafina Mitchell MD;  [facility];  Service: Vascular;  Laterality: Left;    Social History:  reports that he has been smoking cigarettes. He has a 58.00 pack-year smoking history. He has never used smokeless tobacco. He reports that he does not drink alcohol and does not use drugs.  Allergies:  Allergies  Allergen Reactions   Aspirin Nausea And Vomiting   Garlic Other (See Comments)    sneezing    Family history:  Family History  Problem Relation Age of Onset   Arthritis Mother    COPD Father    Stroke Father    Multiple sclerosis Sister      Home Meds: Prior to Admission medications   Medication Sig Start Date End Date Taking? Authorizing Provider  acetaminophen (TYLENOL) 500 MG tablet Take 2 tablets (1,000 mg total) by mouth  every 6 (six) hours as needed for mild pain or headache. Patient taking differently: Take 1,500 mg by mouth 3 (three) times daily as needed for moderate pain. 05/15/20   EDagoberto Ligas PA-C  atorvastatin (LIPITOR) 20 MG tablet Take 1 tablet (20 mg total) by mouth daily. 10/29/20   BSerafina Mitchell MD  buPROPion (WELLBUTRIN XL) 150 MG 24 hr tablet TAKE 1 TABLET (150 MG TOTAL) BY MOUTH EVERY MORNING. Patient not taking: Reported on 11/30/2020 07/14/20 07/14/21  EDagoberto Ligas PA-C  ceFAZolin (ANCEF) IVPB Inject 2 g into the vein every 8 (eight) hours. Indication:  Bypass graft infection First Dose: No Last Day of Therapy: 10/17/20 Labs - Once weekly:  CBC/D and BMP, Labs - Every other week:  ESR and CRP Method of administration: IV Push Method of administration may be changed at the discretion of home infusion pharmacist based upon assessment of the patient and/or caregiver's ability to self-administer the medication ordered. Patient not taking: Reported on 11/30/2020 09/11/20   SBarbie Banner PA-C  clopidogrel (PLAVIX) 75 MG tablet Take 1 tablet (75 mg total) by mouth daily. 10/29/20   BSerafina Mitchell MD  gabapentin (NEURONTIN) 400 MG capsule Take  one capsule by mouth in the morning, and two capsules by mouth at bedtime. 10/29/20   Rhyne, Hulen Shouts, PA-C  Ibuprofen-diphenhydrAMINE HCl (IBUPROFEN PM) 200-25 MG CAPS Take 3 tablets by mouth at bedtime as needed (sleep).    [provider]  lactose free nutrition (BOOST) LIQD Take 237 mLs by mouth daily.    [provider]  losartan (COZAAR) 25 MG tablet Take 1 tablet (25 mg total) by mouth daily. 11/18/20 11/18/21  [name], FNP  OVER THE COUNTER MEDICATION Take 1 capsule by mouth daily. Stacker energy pill    [provider]  phenylephrine (SUDAFED PE) 10 MG TABS tablet Take 10 mg by mouth every 4 (four) hours as needed (allergies).    [provider]  Sennosides (EX-LAX PO) Take 1 tablet by mouth daily as  needed (constipation).    [provider]  traMADol (ULTRAM) 50 MG tablet Take 1 tablet (50 mg total) by mouth every 12 (twelve) hours as needed. Patient not taking: Reported on 11/30/2020 11/17/20   Gabriel Earing, PA-C  Vitamin D, Ergocalciferol, (DRISDOL) 1.25 MG (50000 UNIT) CAPS capsule Take 1 capsule (50,000 Units total) by mouth 2 (two) times a week. Patient not taking: Reported on 11/30/2020 08/17/20   [name], FNP    Physical Exam: Vitals:   12/07/20 1415 12/07/20 1430 12/07/20 1500 12/07/20 1735  BP:  (!) 145/96 (!) 153/98 (!) 166/93  Pulse:  (!) 105 (!) 106 (!) 58  Resp:  (!) 29 (!) 21 (!) 21  Temp:      TempSrc:      SpO2:  100% 99% 95%  Weight: 68 kg     Height: 5' 7"  (1.702 m)      Wt Readings from Last 3 Encounters:  12/07/20 68 kg  12/01/20 68 kg  11/30/20 65.8 kg   Body mass index is 23.49 kg/m.  General exam: Pleasant middle-aged African-American male.  Not in physical distress Skin: No rashes, lesions or ulcers. HEENT: Atraumatic, normocephalic, no obvious bleeding Lungs: Clear to auscultation bilaterally CVS: Regular rate and rhythm, no murmur GI/Abd soft, nontender, nondistended, bowel sound present CNS: Alert, awake, oriented x3 Psychiatry: More appropriate Extremities: Right leg with trace pedal edema.  Left elbow knee amputation stump with  thick purulent discharge from the gaping with surrounding cellulitis    Labs on Admission:   CBC: Recent Labs  Lab 12/01/20 0750 12/07/20 1441  WBC  --  10.7*  NEUTROABS  --  9.2*  HGB 10.2* 9.3*  HCT 30.0* 27.6*  MCV  --  75.4*  PLT  --  621*    Basic Metabolic Panel: Recent Labs  Lab 12/01/20 0750 12/07/20 1441  NA 136 138  K 4.1 3.9  CL 107 105  CO2  --  25  GLUCOSE 109* 108*  BUN 17 11  CREATININE 0.60* 0.64  CALCIUM  --  8.5*    Liver Function Tests: Recent Labs  Lab 12/07/20 1441  AST 18  ALT 22  ALKPHOS 130*  BILITOT 0.2*  PROT 6.2*  ALBUMIN 2.6*   No  results for input(s): LIPASE, AMYLASE in the last 168 hours. No results for input(s): AMMONIA in the last 168 hours.  Cardiac Enzymes: No results for input(s): CKTOTAL, CKMB, CKMBINDEX, TROPONINI in the last 168 hours.  BNP (last 3 results) No results for input(s): BNP in the last 8760 hours.  ProBNP (last 3 results) No results for input(s): PROBNP in the last 8760 hours.  CBG:  No results for input(s): GLUCAP in the last 168 hours.  Lipase  No results found for: LIPASE   Urinalysis    Component Value Date/Time   COLORURINE STRAW (A) 12/07/2020 1452   APPEARANCEUR CLEAR 12/07/2020 1452   LABSPEC 1.013 12/07/2020 1452   PHURINE 6.0 12/07/2020 1452   GLUCOSEU NEGATIVE 12/07/2020 1452   [place] 12/07/2020 1452   [place] 12/07/2020 1452   [place] 12/07/2020 1452   PROTEINUR NEGATIVE 12/07/2020 1452   NITRITE NEGATIVE 12/07/2020 1452   LEUKOCYTESUR NEGATIVE 12/07/2020 1452     Drugs of Abuse  No results found for: LABOPIA, COCAINSCRNUR, LABBENZ, AMPHETMU, THCU, LABBARB    Radiological Exams on Admission: DG Chest 2 View  Result Date: 12/07/2020 CLINICAL DATA:  Suspected Sepsis EXAM: CHEST - 2 VIEW COMPARISON:  Radiograph 05/09/2020 FINDINGS: Unchanged cardiomediastinal silhouette. There is no focal airspace consolidation. There is no large pleural effusion or visible pneumothorax. There is no acute osseous abnormality. Thoracic spondylosis. IMPRESSION: No focal airspace consolidation. Electronically Signed   By: Maurine Simmering M.D.   On: 12/07/2020 15:49   DG Femur Min 2 Views Left  Result Date: 12/07/2020 CLINICAL DATA:  Patient complains of distal left femur wound infection post amputation 3 months ago. Suspected sepsis. EXAM: LEFT FEMUR 2 VIEWS COMPARISON:  CT runoff study 10/01/2020. FINDINGS: Interval above the knee amputation through the distal left femur. The amputation margins are sharp, without bone destruction to suggest osteomyelitis.  There is localized gas within the soft tissues of the distal stump. Scattered vascular calcifications and vascular clips are noted. No unexpected foreign body. IMPRESSION: Status post above the knee amputation without radiographic evidence of osteomyelitis. Nonspecific distal soft tissue emphysema which could be contained within the wound or indicate soft tissue infection. Electronically Signed   By: Richardean Sale M.D.   On: 12/07/2020 15:51     ------------------------------------------------------------------------------------------------------ Assessment/Plan: Active Problems:   Wound cellulitis  Infected left AKA amputation stump -On 8/14, patient underwent bilateral femoral artery bypass graft who underwent left above-knee amputation, removal of infected left femoral-popliteal bypass graft, placement of antibiotic beads by vascular surgery.  He was discharged to home with p.o. Bactrim for 6 weeks, oxycodone for pain as well as wound VAC and home health services. -Presents again with infected stump site. -X-ray did not show evidence of osteomyelitis. -Currently afebrile, mildly elevated WBC count. -IV Zosyn was started in the ED.  Continue the same. Recent Labs  Lab 12/07/20 1441  WBC 10.7*  LATICACIDVEN 1.2   Peripheral artery disease Hyperlipidemia -Continue Plavix, Lipitor -Tramadol for pain.  Essential hypertension -Keep losartan on hold in anticipation of procedure.  Hydralazine IV as needed  Heavy smoking -1 pack/day smoker.  Nicotine patch offered.  Counseled to quit.  Mobility: PT eval postprocedure Code Status:   Code Status: Prior full code DVT prophylaxis: Heparin subcu Antimicrobials: IV Zosyn Fluid: NS at 75  Diet:  Diet Order             Diet NPO time specified  Diet effective midnight                    Consultants: Vascular surgery Family Communication: None at bedside    Dispo: The patient is from: Home              Anticipated d/c is to:  Needs PT eval postprocedure.  ------------------------------------------------------------------------------------- Severity of Illness: The appropriate patient status for this patient is INPATIENT. Inpatient status is judged to be  reasonable and necessary in order to provide the required intensity of service to ensure the patient's safety. The patient's presenting symptoms, physical exam findings, and initial radiographic and laboratory data in the context of their chronic comorbidities is felt to place them at high risk for further clinical deterioration. Furthermore, it is not anticipated that the patient will be medically stable for discharge from the hospital within 2 midnights of admission.   * I certify that at the point of admission it is my clinical judgment that the patient will require inpatient hospital care spanning beyond 2 midnights from the point of admission due to high intensity of service, high risk for further deterioration and high frequency of surveillance required.*   Signed, Terrilee Croak, MD [facility] 12/07/2020

## 2020-12-07 NOTE — Telephone Encounter (Signed)
LCSW reached out to pt sister to f/u on recommendations that were made for pt to go to hospital (see [facility] note on 10/14). Pt sister shares that [facility] is at their house now trying to get pt to go to hospital, he never went to ED even after multiple recommendations. LCSW inquired as to if pt church friend would be willing to accompany him to hospital to help him work through his options. Pt sister states she will call her and see. I again re-enforced that the recommendation is for pt to go to nearest ED for care (and that he is encouraged to bring a support person with him) and that the concern for infection and greater harm exists if he continues to not get care for his current amputation.   [name], MSW, LCSW [facility] Heart/Vascular Care Navigation  [phone]

## 2020-12-07 NOTE — ED Provider Notes (Signed)
[facility] EMERGENCY DEPARTMENT Provider Note   CSN: [id number] Arrival date & time: 12/07/20  1359     History Chief Complaint  Patient presents with   Wound Infection    Benjamin [name] is a 60 y.o. 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 Trula Slade with removal of infected left femoral, popliteal bypass graft, placement of antibiotic beads who has had over one month of dehiscence, purulent drainage, pain of the affected limn. Patient has been following with Dr. Donzetta Matters and was evaluated earlier this month with discussion of additional surgery, higher amputation, further courses of antibiotics. It appears as though patient was admitted on 12/01/20 but was discharged AMA. Patient was sent for further evaluation by [facility] today for worry of worsening appearance of wound, and exposed bone. Patient denies significant pain, numbness, has had no systemic fever, chills. Patient is hopeful he will be able to only need IV antibiotics.  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   Femoral-popliteal bypass graft occlusion, left ([place]) 11/13/2019   PAD (peripheral artery disease) ([place]) 11/13/2019   Callus of foot 07/01/2019   Intermittent claudication ([place]) 01/20/2017   Need for influenza vaccination 01/20/2017   Needs smoking cessation education 09/06/2016   Abnormal echocardiogram 09/05/2016   Abnormal EKG 07/25/2016   Preoperative cardiovascular examination 07/25/2016   Shortness of breath on exertion 07/25/2016   Dyslipidemia, goal LDL below 70 07/25/2016    Uncontrolled hypertension 07/25/2016   Abnormal chest x-ray 07/04/2016   Peripheral vascular disease ([place]) 07/04/2016   Decreased pedal pulses 06/24/2016   Varicose vein of leg 06/24/2016   Heavy smoker 06/24/2016   Abnormal weight loss 06/24/2016   Foot pain, left 06/24/2016   Screening for prostate cancer 06/24/2016    Past Surgical History:  Procedure Laterality Date   ABDOMINAL AORTOGRAM W/LOWER EXTREMITY N/A 07/12/2016   Procedure: Abdominal Aortogram w/Lower Extremity;  Surgeon: Serafina Mitchell, MD;  Location: [place] [facility];  Service: Cardiovascular;  Laterality: N/A;  Bilateral extermity: Patent Renal As. No sig Dz in infrarenal Abd Aorta. Normal Bilat Iliac arteries. R SFA is 100% @ origin - recon in AK-Pop A. R PT A patent. L CFA occluded. L PFA recon @ origin, L SFA occluded w/ recon in AK Pop A.   ABDOMINAL AORTOGRAM W/LOWER EXTREMITY N/A 10/15/2019   Procedure: ABDOMINAL AORTOGRAM W/LOWER EXTREMITY;  Surgeon: Serafina Mitchell, MD;  Location: [place] [facility];  Service: Cardiovascular;  Laterality: N/A;   AMPUTATION Left 10/04/2020   Procedure: LEFT ABOVE KNEE AMPUTATION;  Surgeon: Serafina Mitchell, MD;  Location: [facility] OR;  Service: Vascular;  Laterality: Left;   AORTA - BILATERAL FEMORAL ARTERY BYPASS GRAFT N/A 05/08/2020   Procedure: AORTA BIFEMORAL BYPASS GRAFT;  Surgeon: Serafina Mitchell, MD;  Location: [facility] OR;  Service: Vascular;  Laterality: N/A;   COLONOSCOPY  06/2016   never   ENDARTERECTOMY FEMORAL Left 11/14/2019   Procedure: Left groin exploration, Redo left femoral artery exposure;  Surgeon: Serafina Mitchell, MD;  Location: [facility]  OR;  Service: Vascular;  Laterality: Left;   FEMORAL-FEMORAL BYPASS GRAFT N/A 11/13/2019   Procedure: BYPASS GRAFT FEMORAL-FEMORAL ARTERY RIGHT TO LEFT USING HEMASHIELD GOLD GRAFT 65m x 30cm;  Surgeon: BSerafina Mitchell MD;  [facility];  Service: Vascular;  Laterality: N/A;   FEMORAL-POPLITEAL BYPASS GRAFT Left 11/13/2019   Procedure: LEFT  FEMORAL BELOW KNEE-POPLITEAL ARTERY USING NON-REVERSED GREATER SAPHENOUS VEIN;  Surgeon: BSerafina Mitchell MD;  [facility] OR;  Service: Vascular;  Laterality: Left;   FEMORAL-POPLITEAL BYPASS GRAFT Left 05/08/2020   Procedure: LEFT REDO BYPASS GRAFT COMMON FEMORAL- BELOW KNEE POPLITEAL ARTERY USING PROPATEN GRAFT;  Surgeon: BSerafina Mitchell MD;  [facility]: [facility] OR;  Service: Vascular;  Laterality: Left;   I & D EXTREMITY Left 09/07/2020   Procedure: IRRIGATION AND DEBRIDEMENT LEFT LEG WITH EXCISION OF LEFT LEG DISTAL BYPASS GRAFT;  Surgeon: CMarty Heck MD;  [facility]  Service: Vascular;  Laterality: Left;   LOWER EXTREMITY ANGIOGRAM Left 11/14/2019   Procedure: LEFT LOWER EXTREMITY ANGIOGRAM, BYPASS GRAFT ANGIOPLASTY;  Surgeon: BSerafina Mitchell MD;  [facility];  Service: Vascular;  Laterality: Left;   NO PAST SURGERIES  06/2016   PERIPHERAL VASCULAR INTERVENTION  10/15/2019   Procedure: PERIPHERAL VASCULAR INTERVENTION;  Surgeon: BSerafina Mitchell MD;  [facility];  Service: Cardiovascular;;  Rt Iliac   REMOVAL OF GRAFT Left 10/04/2020   Procedure: REMOVAL OF LEFT FEMORAL TO POPITEAL BYPASS GRAFT;  Surgeon: BSerafina Mitchell MD;  [facility];  Service: Vascular;  Laterality: Left;       Family History  Problem Relation Age of Onset   Arthritis Mother    COPD Father    Stroke Father    Multiple sclerosis Sister     Social History   Tobacco Use   Smoking status: Every Day    Packs/day: 2.00    Years: 29.00    Pack years: 58.00    Types: Cigarettes   Smokeless tobacco: Never   Tobacco comments:    he is down to 1 PPD - 07/22/2020  Vaping Use   Vaping Use: Never used  Substance Use Topics   Alcohol use: No   Drug use: No    Home Medications Prior to Admission medications   Medication Sig Start Date End Date Taking? Authorizing Provider  acetaminophen (TYLENOL) 500 MG tablet Take 2 tablets (1,000 mg total) by mouth every 6 (six) hours as  needed for mild pain or headache. Patient taking differently: Take 1,500 mg by mouth 3 (three) times daily as needed for moderate pain. 05/15/20   EDagoberto Ligas PA-C  atorvastatin (LIPITOR) 20 MG tablet Take 1 tablet (20 mg total) by mouth daily. 10/29/20   BSerafina Mitchell MD  buPROPion (WELLBUTRIN XL) 150 MG 24 hr tablet TAKE 1 TABLET (150 MG TOTAL) BY MOUTH EVERY MORNING. Patient not taking: Reported on 11/30/2020 07/14/20 07/14/21  EDagoberto Ligas PA-C  ceFAZolin (ANCEF) IVPB Inject 2 g into the vein every 8 (eight) hours. Indication:  Bypass graft infection First Dose: No Last Day of Therapy: 10/17/20 Labs - Once weekly:  CBC/D and BMP, Labs - Every other week:  ESR and CRP Method of administration: IV Push Method of administration may be changed at the discretion of home infusion pharmacist based upon assessment of the patient and/or caregiver's ability to self-administer the medication ordered. Patient not taking: Reported on 11/30/2020 09/11/20   SBarbie Banner PA-C  clopidogrel (PLAVIX) 75 MG tablet Take  1 tablet (75 mg total) by mouth daily. 10/29/20   Serafina Mitchell, MD  gabapentin (NEURONTIN) 400 MG capsule Take one capsule by mouth in the morning, and two capsules by mouth at bedtime. 10/29/20   Rhyne, Hulen Shouts, PA-C  Ibuprofen-diphenhydrAMINE HCl (IBUPROFEN PM) 200-25 MG CAPS Take 3 tablets by mouth at bedtime as needed (sleep).    [provider]  lactose free nutrition (BOOST) LIQD Take 237 mLs by mouth daily.    [provider]  losartan (COZAAR) 25 MG tablet Take 1 tablet (25 mg total) by mouth daily. 11/18/20 11/18/21  [name], FNP  OVER THE COUNTER MEDICATION Take 1 capsule by mouth daily. Stacker energy pill    [provider]  phenylephrine (SUDAFED PE) 10 MG TABS tablet Take 10 mg by mouth every 4 (four) hours as needed (allergies).    [provider]  Sennosides (EX-LAX PO) Take 1 tablet by mouth daily as needed (constipation).     [provider]  traMADol (ULTRAM) 50 MG tablet Take 1 tablet (50 mg total) by mouth every 12 (twelve) hours as needed. Patient not taking: Reported on 11/30/2020 11/17/20   Gabriel Earing, PA-C  Vitamin D, Ergocalciferol, (DRISDOL) 1.25 MG (50000 UNIT) CAPS capsule Take 1 capsule (50,000 Units total) by mouth 2 (two) times a week. Patient not taking: Reported on 11/30/2020 08/17/20   [name], FNP    Allergies    Aspirin and Garlic  Review of Systems   Review of Systems  Musculoskeletal:        Leg pain  Skin:  Positive for color change and wound.  All other systems reviewed and are negative.  Physical Exam Updated Vital Signs BP (!) 166/93   Pulse (!) 58   Temp 98.8 F (37.1 C) (Oral)   Resp (!) 21   Ht 5' 7"  (1.702 m)   Wt 68 kg   SpO2 95%   BMI 23.49 kg/m   Physical Exam Vitals and nursing note reviewed.  Constitutional:      General: He is not in acute distress.    Appearance: Normal appearance.  HENT:     Head: Normocephalic and atraumatic.  Eyes:     General:        Right eye: No discharge.        Left eye: No discharge.  Cardiovascular:     Rate and Rhythm: Normal rate and regular rhythm.     Heart sounds: No murmur heard.   No friction rub. No gallop.  Pulmonary:     Effort: Pulmonary effort is normal.     Breath sounds: Normal breath sounds.  Abdominal:     General: Bowel sounds are normal.     Palpations: Abdomen is soft.  Musculoskeletal:     Comments: Left leg above-the-knee amputation with dehiscence, purulent discharge, visualized in the femur, there is some redness, tenderness from the wound edge  Skin:    General: Skin is warm and dry.     Capillary Refill: Capillary refill takes less than 2 seconds.  Neurological:     Mental Status: He is alert and oriented to person, place, and time.  Psychiatric:        Mood and Affect: Mood normal.        Behavior: Behavior normal.      ED Results / Procedures / Treatments    Labs (all labs ordered are listed, but only abnormal results are displayed) Labs Reviewed  COMPREHENSIVE METABOLIC PANEL -  Abnormal; Notable for the following components:      Result Value   Glucose, Bld 108 (*)    Calcium 8.5 (*)    Total Protein 6.2 (*)    Albumin 2.6 (*)    Alkaline Phosphatase 130 (*)    Total Bilirubin 0.2 (*)    All other components within normal limits  CBC WITH DIFFERENTIAL/PLATELET - Abnormal; Notable for the following components:   WBC 10.7 (*)    RBC 3.66 (*)    Hemoglobin 9.3 (*)    HCT 27.6 (*)    MCV 75.4 (*)    MCH 25.4 (*)    RDW 21.6 (*)    Platelets 621 (*)    Neutro Abs 9.2 (*)    All other components within normal limits  RESP PANEL BY RT-PCR (FLU A&B, COVID) ARPGX2  CULTURE, BLOOD (ROUTINE X 2)  CULTURE, BLOOD (ROUTINE X 2)  LACTIC ACID, PLASMA  PROTIME-INR  LACTIC ACID, PLASMA  URINALYSIS, ROUTINE W REFLEX MICROSCOPIC    EKG EKG Interpretation  Date/Time:  Monday December 07 2020 14:03:06 EDT Ventricular Rate:  114 PR Interval:  121 QRS Duration: 74 QT Interval:  355 QTC Calculation: 489 R Axis:   -32 Text Interpretation: Sinus tachycardia Left axis deviation Consider anterior infarct since last tracing no significant change Confirmed by Daleen Bo [phone]) on 12/07/2020 3:00:43 PM  Radiology DG Chest 2 View  Result Date: 12/07/2020 CLINICAL DATA:  Suspected Sepsis EXAM: CHEST - 2 VIEW COMPARISON:  Radiograph 05/09/2020 FINDINGS: Unchanged cardiomediastinal silhouette. There is no focal airspace consolidation. There is no large pleural effusion or visible pneumothorax. There is no acute osseous abnormality. Thoracic spondylosis. IMPRESSION: No focal airspace consolidation. Electronically Signed   By: Maurine Simmering M.D.   On: 12/07/2020 15:49   DG Femur Min 2 Views Left  Result Date: 12/07/2020 CLINICAL DATA:  Patient complains of distal left femur wound infection post amputation 3 months ago. Suspected sepsis. EXAM: LEFT  FEMUR 2 VIEWS COMPARISON:  CT runoff study 10/01/2020. FINDINGS: Interval above the knee amputation through the distal left femur. The amputation margins are sharp, without bone destruction to suggest osteomyelitis. There is localized gas within the soft tissues of the distal stump. Scattered vascular calcifications and vascular clips are noted. No unexpected foreign body. IMPRESSION: Status post above the knee amputation without radiographic evidence of osteomyelitis. Nonspecific distal soft tissue emphysema which could be contained within the wound or indicate soft tissue infection. Electronically Signed   By: Richardean Sale M.D.   On: 12/07/2020 15:51    Procedures Procedures   Medications Ordered in ED Medications  piperacillin-tazobactam (ZOSYN) IVPB 3.375 g (3.375 g Intravenous New Bag/Given 12/07/20 1733)    ED Course  I have reviewed the triage vital signs and the nursing notes.  Pertinent labs & imaging results that were available during my care of the patient were reviewed by me and considered in my medical decision making (see chart for details).  Clinical Course as of 12/07/20 1741  Mon Dec 08, 6958  [id number] 60 year old male recent AKI on left here with wound dehiscence and purulent discharge.  Denies any fever or other systemic symptoms.  Open wound with some whitish-yellow tissue.  No crepitus.  Getting labs and will review with vascular [MB]    Clinical Course User Index [MB] Hayden Rasmussen, MD   MDM Rules/Calculators/A&P  I discussed this case with my attending physician who cosigned this note including patient's presenting symptoms, physical exam, and planned diagnostics and interventions. Attending physician stated agreement with plan or made changes to plan which were implemented.   Attending physician assessed patient at bedside.  Above-knee amputation performed 14 August of this year, with reevaluation admission to the hospital on the 11th of  this month for consult with vascular surgery regarding potential wound washout, IV antibiotics, revision, potential need for further amputation.  Patient without systemic signs of infection at this time, minimally elevated white blood cell count, no fever, no chills.  Patient does have some tachycardia, tachypnea.  Patient overall well-appearing other than significant purulent drainage from dehisced AKA wound as noted in physical exam, and picture above.  Will consult with Dr. Donzetta Matters discuss further treatment.  Do believe patient will require at the very least wound washout, IV antibiotics, but may require additional surgery as discussed at patient's last admission prior to his elopement AMA.  Spoke with Dr. Donzetta Matters from vascular surgery who would like patient admitted to medicine and will consult at this time.  Did assess patient at bedside, will plan to take him to surgery tomorrow, patient to be n.p.o. after midnight, will start Zosyn.  We will plan for admission to the hospitalist service at this time.  Patient understands and agrees to plan. Final Clinical Impression(s) / ED Diagnoses Final diagnoses:  Wound infection  AKA stump complication [facility])    Rx / DC Orders ED Discharge Orders     None        Anselmo Pickler, PA-C 12/07/20 1741    Daleen Bo, MD 12/07/20 2026

## 2020-12-07 NOTE — Consult Note (Signed)
Hospital Consult    Reason for Consult:  necrotic left aka Referring Physician:  Dr. Pietro Cassis MRN #:  [id number]  History of Present Illness This is a 60 y.o. male with complicated past revascularization history.  Most recently he underwent left above-knee amputation with removal of graft and ligation of his common femoral artery.  Unfortunately his above-knee amputation has failed to heal, he was scheduled last week for revision versus debridement but after arriving for surgery he refused.  He subsequently returned home.  Our office was then contacted last Friday as the wound was worsening and home health was concerned.  He was again instructed to return to the emergency department for surgical evaluation.  He now presents today for further evaluation with worsening drainage.  He denies any fevers or chills.  He is hopeful to salvage his left above-knee amputation site.  He takes Plavix he does not take blood thinners.  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 No sig Dz in infrarenal Abd Aorta. Normal Bilat Iliac arteries. R SFA is 100% @ origin - recon in AK-Pop A. R PT A patent. L CFA occluded. L PFA recon @ origin, L SFA occluded w/ recon in AK Pop A.   ABDOMINAL AORTOGRAM W/LOWER EXTREMITY N/A 10/15/2019   Procedure: ABDOMINAL AORTOGRAM W/LOWER EXTREMITY;  Surgeon: Serafina Mitchell, MD;  Location: [facility];  Service: Cardiovascular;  Laterality: N/A;   AMPUTATION Left 10/04/2020   Procedure: LEFT ABOVE KNEE AMPUTATION;   Surgeon: Serafina Mitchell, MD;  Location: [facility];  Service: Vascular;  Laterality: Left;   AORTA - BILATERAL FEMORAL ARTERY BYPASS GRAFT N/A 05/08/2020   Procedure: AORTA BIFEMORAL BYPASS GRAFT;  Surgeon: Serafina Mitchell, MD;  Location: [facility];  Service: Vascular;  Laterality: N/A;   COLONOSCOPY  06/2016   never   ENDARTERECTOMY FEMORAL Left 11/14/2019   Procedure: Left groin exploration, Redo left femoral artery exposure;  Surgeon: Serafina Mitchell, MD;  [facility];  Service: Vascular;  Laterality: Left;   FEMORAL-FEMORAL BYPASS GRAFT N/A 11/13/2019   Procedure: BYPASS GRAFT FEMORAL-FEMORAL ARTERY RIGHT TO LEFT USING HEMASHIELD GOLD GRAFT 63m x 30cm;  Surgeon: BSerafina Mitchell MD;  [facility];  Service: Vascular;  Laterality: N/A;   FEMORAL-POPLITEAL BYPASS GRAFT Left 11/13/2019   Procedure: LEFT FEMORAL BELOW KNEE-POPLITEAL ARTERY USING NON-REVERSED GREATER SAPHENOUS VEIN;  Surgeon: BSerafina Mitchell MD;  [facility];  Service: Vascular;  Laterality: Left;   FEMORAL-POPLITEAL BYPASS GRAFT Left 05/08/2020   Procedure: LEFT REDO BYPASS GRAFT COMMON FEMORAL- BELOW KNEE POPLITEAL ARTERY USING PROPATEN GRAFT;  Surgeon: BSerafina Mitchell MD;  [facility];  Service: Vascular;  Laterality: Left;   I & D EXTREMITY Left 09/07/2020   Procedure: IRRIGATION AND DEBRIDEMENT LEFT LEG WITH EXCISION OF LEFT LEG DISTAL BYPASS GRAFT;  Surgeon: CMarty Heck MD;  [facility]  Service: Vascular;  Laterality: Left;   LOWER EXTREMITY ANGIOGRAM Left 11/14/2019   Procedure: LEFT LOWER EXTREMITY ANGIOGRAM, BYPASS GRAFT ANGIOPLASTY;  Surgeon: Serafina Mitchell, MD;  Location: [facility];  Service: Vascular;  Laterality: Left;   NO PAST SURGERIES  06/2016   PERIPHERAL VASCULAR INTERVENTION  10/15/2019   Procedure: PERIPHERAL VASCULAR INTERVENTION;  Surgeon: Serafina Mitchell, MD;  Location: [place] [facility];  Service: Cardiovascular;;  Rt Iliac   REMOVAL OF GRAFT Left 10/04/2020   Procedure: REMOVAL OF  LEFT FEMORAL TO POPITEAL BYPASS GRAFT;  Surgeon: Serafina Mitchell, MD;  Location: [place];  Service: Vascular;  Laterality: Left;    Allergies  Allergen Reactions   Aspirin Nausea And Vomiting   Garlic Other (See Comments)    sneezing    Prior to Admission medications   Medication Sig Start Date End Date Taking? Authorizing Provider  acetaminophen (TYLENOL) 500 MG tablet Take 2 tablets (1,000 mg total) by mouth every 6 (six) hours as needed for mild pain or headache. Patient taking differently: Take 1,500 mg by mouth 3 (three) times daily as needed for moderate pain. 05/15/20   Dagoberto Ligas, PA-C  atorvastatin (LIPITOR) 20 MG tablet Take 1 tablet (20 mg total) by mouth daily. 10/29/20   Serafina Mitchell, MD  buPROPion (WELLBUTRIN XL) 150 MG 24 hr tablet TAKE 1 TABLET (150 MG TOTAL) BY MOUTH EVERY MORNING. Patient not taking: Reported on 11/30/2020 07/14/20 07/14/21  Dagoberto Ligas, PA-C  ceFAZolin (ANCEF) IVPB Inject 2 g into the vein every 8 (eight) hours. Indication:  Bypass graft infection First Dose: No Last Day of Therapy: 10/17/20 Labs - Once weekly:  CBC/D and BMP, Labs - Every other week:  ESR and CRP Method of administration: IV Push Method of administration may be changed at the discretion of home infusion pharmacist based upon assessment of the patient and/or caregiver's ability to self-administer the medication ordered. Patient not taking: Reported on 11/30/2020 09/11/20   Barbie Banner, PA-C  clopidogrel (PLAVIX) 75 MG tablet Take 1 tablet (75 mg total) by mouth daily. 10/29/20   Serafina Mitchell, MD  gabapentin (NEURONTIN) 400 MG capsule Take one capsule by mouth in the morning, and two capsules by mouth at bedtime. 10/29/20   Rhyne, Hulen Shouts, PA-C  Ibuprofen-diphenhydrAMINE HCl (IBUPROFEN PM) 200-25 MG CAPS Take 3 tablets by mouth at bedtime as needed (sleep).    [provider]  lactose free nutrition (BOOST) LIQD Take 237 mLs by mouth daily.    [provider]  losartan (COZAAR) 25 MG tablet Take 1 tablet (25 mg total) by mouth daily. 11/18/20 11/18/21  [name], FNP  OVER THE COUNTER MEDICATION Take 1 capsule by mouth daily. Stacker energy pill    [provider]  phenylephrine (SUDAFED PE) 10 MG TABS tablet Take 10 mg by mouth every 4 (four) hours as needed (allergies).    [provider]  Sennosides (EX-LAX PO) Take 1 tablet by mouth daily as needed (constipation).    [provider]  traMADol (ULTRAM) 50 MG tablet Take 1 tablet (50 mg total) by mouth every 12 (twelve) hours as needed. Patient not taking: Reported on 11/30/2020 11/17/20   Gabriel Earing, PA-C  Vitamin D, Ergocalciferol, (DRISDOL) 1.25 MG (50000 UNIT) CAPS capsule Take 1 capsule (50,000 Units total) by mouth 2 (two) times a week. Patient not taking: Reported on 11/30/2020 08/17/20   [name], FNP    Social History   Socioeconomic History   Marital status: Single    Spouse name: Not on file   Number of children: Not on file   Years  of education: 12   Highest education level: Not on file  Occupational History   Not on file  Tobacco Use   Smoking status: Every Day    Packs/day: 2.00    Years: 29.00    Pack years: 58.00    Types: Cigarettes   Smokeless tobacco: Never   Tobacco comments:    he is down to 1 PPD - 07/22/2020  Vaping Use   Vaping Use: Never used  Substance and Sexual Activity   Alcohol use: No   Drug use: No   Sexual activity: Not Currently  Other Topics Concern   Not on file  Social History Narrative   He works as a Secondary school teacher at [employer].   He lives with his sister and her son.   Walks every day to work at least 15+ minutes.   Social Determinants of Health   Financial Resource Strain: Low Risk    Difficulty of Paying Living Expenses: Not very hard  Food Insecurity: No Food Insecurity   Worried About Charity fundraiser in the Last Year: Never true   Ran Out of Food in the Last Year:  Never true  Transportation Needs: No Transportation Needs   Lack of Transportation (Medical): No   Lack of Transportation (Non-Medical): No  Physical Activity: Not on file  Stress: Not on file  Social Connections: Not on file  Intimate Partner Violence: Not on file    Family History  Problem Relation Age of Onset   Arthritis Mother    COPD Father    Stroke Father    Multiple sclerosis Sister     ROS Left above-knee amputation site pain  Physical Examination  Vitals:   12/07/20 1500 12/07/20 1735  BP: (!) 153/98 (!) 166/93  Pulse: (!) 106 (!) 58  Resp: (!) 21 (!) 21  Temp:    SpO2: 99% 95%   Body mass index is 23.49 kg/m.  Physical Exam He is awake alert and oriented Nonlabored respirations Left above-knee amputation site has dehisced completely, there appears to be exposed bone, there is fibrinous exudate and what appears to be necrotic muscle draining from the previous staple line.  The medial leg has a previous saphenectomy site that is now eschar.  The leg is cold distally does have improved warmth and color about the mid thigh level.  CBC    Component Value Date/Time   WBC 10.7 (H) 12/07/2020 1441   RBC 3.66 (L) 12/07/2020 1441   HGB 9.3 (L) 12/07/2020 1441   HGB 12.2 (L) 07/22/2020 1150   HCT 27.6 (L) 12/07/2020 1441   HCT 37.4 (L) 07/22/2020 1150   PLT 621 (H) 12/07/2020 1441   PLT 419 07/22/2020 1150   MCV 75.4 (L) 12/07/2020 1441   MCV 76 (L) 07/22/2020 1150   MCH 25.4 (L) 12/07/2020 1441   MCHC 33.7 12/07/2020 1441   RDW 21.6 (H) 12/07/2020 1441   RDW 15.8 (H) 07/22/2020 1150   LYMPHSABS 1.0 12/07/2020 1441   MONOABS 0.5 12/07/2020 1441   EOSABS 0.0 12/07/2020 1441   BASOSABS 0.0 12/07/2020 1441    BMET    Component Value Date/Time   NA 138 12/07/2020 1441   NA 140 07/22/2020 1150   K 3.9 12/07/2020 1441   CL 105 12/07/2020 1441   CO2 25 12/07/2020 1441   GLUCOSE 108 (H) 12/07/2020 1441   BUN 11 12/07/2020 1441   BUN 14 07/22/2020 1150    CREATININE 0.64 12/07/2020 1441   CREATININE 1.02  01/20/2017 0908   CALCIUM 8.5 (L) 12/07/2020 1441   GFRNONAA >60 12/07/2020 1441   GFRAA >60 11/14/2019 0508    COAGS: Lab Results  Component Value Date   INR 1.1 12/07/2020   INR 1.1 10/01/2020   INR 1.2 05/08/2020     Non-Invasive Vascular Imaging:   No studies  ASSESSMENT/PLAN: This is a 60 y.o. male with necrotic left above-knee amputation site.  He will require at least revision of his amputation versus more likely conversion to higher above-knee amputation site.  I have again discussed this with him and he has multiple questions regarding future prosthetic use.  Patient is hopeful for antibiotics and for me to staple the wound closed.  I have again discussed with the patient that this is not a viable option as the wound is actually necrotic and not necessarily infected although antibiotics will be initiated.  He will require at least debridement more likely conversion to higher above-knee amputation site.  I discussed with him that we can consider a prosthetic once we get a limb to heal but at this time this is more of a life-threatening situation given that he may be at risk for hip disarticulation if we cannot get any above-knee amputation to heal given previous ligation of his common femoral artery.  Patient does not seem to grasp the gravity of the situation at the end of our talk is hopeful again for antibiotics and further stapling of his wound.  We have tentatively planned for the above-noted procedure tomorrow in the operating room.  I think that if he remains obstinate for surgery he may require palliative care consultation to determine goals of care.  Camyra Vaeth C. Donzetta Matters, MD [facility] [place] Office: [phone] Pager: [phone]

## 2020-12-08 ENCOUNTER — Encounter (HOSPITAL_COMMUNITY): Payer: Self-pay | Admitting: Internal Medicine

## 2020-12-08 ENCOUNTER — Encounter (HOSPITAL_COMMUNITY)
Admission: EM | Disposition: A | Discharge status: Rehabilitation Facility | Payer: Self-pay | Source: Home / Self Care | Attending: Internal Medicine

## 2020-12-08 ENCOUNTER — Inpatient Hospital Stay (HOSPITAL_COMMUNITY): Payer: Medicaid Other | Admitting: Certified Registered"

## 2020-12-08 DIAGNOSIS — T8789 Other complications of amputation stump: Secondary | ICD-10-CM

## 2020-12-08 DIAGNOSIS — L0390 Cellulitis, unspecified: Secondary | ICD-10-CM | POA: Diagnosis not present

## 2020-12-08 HISTORY — PX: AMPUTATION: SHX166

## 2020-12-08 LAB — CBC
HCT: 27.4 % — ABNORMAL LOW (ref 39.0–52.0)
Hemoglobin: 9 g/dL — ABNORMAL LOW (ref 13.0–17.0)
MCH: 25.4 pg — ABNORMAL LOW (ref 26.0–34.0)
MCHC: 32.8 g/dL (ref 30.0–36.0)
MCV: 77.4 fL — ABNORMAL LOW (ref 80.0–100.0)
Platelets: 542 10*3/uL — ABNORMAL HIGH (ref 150–400)
RBC: 3.54 MIL/uL — ABNORMAL LOW (ref 4.22–5.81)
RDW: 21.8 % — ABNORMAL HIGH (ref 11.5–15.5)
WBC: 11.4 10*3/uL — ABNORMAL HIGH (ref 4.0–10.5)
nRBC: 0 % (ref 0.0–0.2)

## 2020-12-08 LAB — BASIC METABOLIC PANEL
Anion gap: 8 (ref 5–15)
BUN: 10 mg/dL (ref 8–23)
CO2: 25 mmol/L (ref 22–32)
Calcium: 8.4 mg/dL — ABNORMAL LOW (ref 8.9–10.3)
Chloride: 102 mmol/L (ref 98–111)
Creatinine, Ser: 0.64 mg/dL (ref 0.61–1.24)
GFR, Estimated: >60 mL/min (ref >60)
Glucose, Bld: 100 mg/dL — ABNORMAL HIGH (ref 70–99)
Potassium: 3.3 mmol/L — ABNORMAL LOW (ref 3.5–5.1)
Sodium: 135 mmol/L (ref 135–145)

## 2020-12-08 LAB — HIV ANTIBODY (ROUTINE TESTING W REFLEX): HIV Screen 4th Generation wRfx: NONREACTIVE

## 2020-12-08 SURGERY — AMPUTATION, ABOVE KNEE
Anesthesia: General | Site: Leg Upper | Laterality: Left

## 2020-12-08 MED ORDER — AMISULPRIDE (ANTIEMETIC) 5 MG/2ML IV SOLN: 10.0000 mg | Freq: Once | INTRAVENOUS | Status: DC | PRN

## 2020-12-08 MED ORDER — PROPOFOL 10 MG/ML IV BOLUS
INTRAVENOUS | Status: DC | PRN
  Administered 2020-12-08: 20 mg via INTRAVENOUS
  Administered 2020-12-08: 50 mg via INTRAVENOUS

## 2020-12-08 MED ORDER — 0.9 % SODIUM CHLORIDE (POUR BTL) OPTIME
TOPICAL | Status: DC | PRN
  Administered 2020-12-08: 1000 mL

## 2020-12-08 MED ORDER — OXYCODONE HCL 5 MG/5ML PO SOLN
5.0000 mg | Freq: Once | ORAL | Status: DC | PRN
Start: 2020-12-08 — End: 2020-12-08

## 2020-12-08 MED ORDER — POTASSIUM CHLORIDE CRYS ER 20 MEQ PO TBCR: 20.0000 meq | EXTENDED_RELEASE_TABLET | Freq: Every day | ORAL | Status: DC | PRN

## 2020-12-08 MED ORDER — DEXAMETHASONE SODIUM PHOSPHATE 10 MG/ML IJ SOLN
INTRAMUSCULAR | Status: AC
  Filled 2020-12-08: qty 1

## 2020-12-08 MED ORDER — DEXAMETHASONE SODIUM PHOSPHATE 10 MG/ML IJ SOLN
INTRAMUSCULAR | Status: DC | PRN
  Administered 2020-12-08: 5 mg via INTRAVENOUS

## 2020-12-08 MED ORDER — PROPOFOL 10 MG/ML IV BOLUS
INTRAVENOUS | Status: AC
  Filled 2020-12-08: qty 20

## 2020-12-08 MED ORDER — ORAL CARE MOUTH RINSE: 15.0000 mL | Freq: Once | OROMUCOSAL | Status: AC

## 2020-12-08 MED ORDER — PANTOPRAZOLE SODIUM 40 MG PO TBEC
40.0000 mg | DELAYED_RELEASE_TABLET | Freq: Every day | ORAL | Status: DC
  Administered 2020-12-08 – 2020-12-15 (×8): 40 mg via ORAL
  Filled 2020-12-08 (×8): qty 1

## 2020-12-08 MED ORDER — ROCURONIUM BROMIDE 10 MG/ML (PF) SYRINGE
PREFILLED_SYRINGE | INTRAVENOUS | Status: AC
  Filled 2020-12-08: qty 10

## 2020-12-08 MED ORDER — SUGAMMADEX SODIUM 200 MG/2ML IV SOLN
INTRAVENOUS | Status: DC | PRN
  Administered 2020-12-08: 130 mg via INTRAVENOUS

## 2020-12-08 MED ORDER — BACITRACIN ZINC 500 UNIT/GM EX OINT
TOPICAL_OINTMENT | CUTANEOUS | Status: AC
  Filled 2020-12-08: qty 28.35

## 2020-12-08 MED ORDER — PROMETHAZINE HCL 25 MG/ML IJ SOLN: 6.2500 mg | INTRAMUSCULAR | Status: DC | PRN

## 2020-12-08 MED ORDER — DOCUSATE SODIUM 100 MG PO CAPS
100.0000 mg | ORAL_CAPSULE | Freq: Every day | ORAL | Status: DC
  Administered 2020-12-09 – 2020-12-15 (×7): 100 mg via ORAL
  Filled 2020-12-08 (×7): qty 1

## 2020-12-08 MED ORDER — ACETAMINOPHEN 10 MG/ML IV SOLN
1000.0000 mg | Freq: Once | INTRAVENOUS | Status: DC | PRN
  Administered 2020-12-08: 1000 mg via INTRAVENOUS

## 2020-12-08 MED ORDER — EPHEDRINE 5 MG/ML INJ
INTRAVENOUS | Status: AC
  Filled 2020-12-08: qty 10

## 2020-12-08 MED ORDER — HEPARIN SODIUM (PORCINE) 5000 UNIT/ML IJ SOLN
5000.0000 [IU] | Freq: Three times a day (TID) | INTRAMUSCULAR | Status: DC
  Administered 2020-12-08 – 2020-12-15 (×21): 5000 [IU] via SUBCUTANEOUS
  Filled 2020-12-08 (×21): qty 1

## 2020-12-08 MED ORDER — BACITRACIN ZINC 500 UNIT/GM EX OINT
TOPICAL_OINTMENT | CUTANEOUS | Status: DC | PRN
  Administered 2020-12-08: 1 via TOPICAL

## 2020-12-08 MED ORDER — ALBUTEROL SULFATE (2.5 MG/3ML) 0.083% IN NEBU: 2.5000 mg | INHALATION_SOLUTION | Freq: Four times a day (QID) | RESPIRATORY_TRACT | Status: DC | PRN

## 2020-12-08 MED ORDER — CHLORHEXIDINE GLUCONATE 0.12 % MT SOLN
OROMUCOSAL | Status: AC
  Administered 2020-12-08: 15 mL via OROMUCOSAL
  Filled 2020-12-08: qty 15

## 2020-12-08 MED ORDER — ONDANSETRON HCL 4 MG/2ML IJ SOLN
INTRAMUSCULAR | Status: AC
  Filled 2020-12-08: qty 2

## 2020-12-08 MED ORDER — OXYCODONE-ACETAMINOPHEN 5-325 MG PO TABS
1.0000 | ORAL_TABLET | ORAL | Status: DC | PRN
  Administered 2020-12-08: 1 via ORAL
  Administered 2020-12-09: 2 via ORAL
  Administered 2020-12-09: 1 via ORAL
  Administered 2020-12-09 – 2020-12-13 (×16): 2 via ORAL
  Administered 2020-12-13: 1 via ORAL
  Administered 2020-12-14 – 2020-12-15 (×4): 2 via ORAL
  Filled 2020-12-08 (×7): qty 2
  Filled 2020-12-08: qty 1
  Filled 2020-12-08 (×16): qty 2

## 2020-12-08 MED ORDER — FENTANYL CITRATE (PF) 100 MCG/2ML IJ SOLN
INTRAMUSCULAR | Status: DC | PRN
  Administered 2020-12-08 (×5): 50 ug via INTRAVENOUS

## 2020-12-08 MED ORDER — ACETAMINOPHEN 325 MG PO TABS: 325.0000 mg | ORAL_TABLET | ORAL | Status: DC | PRN

## 2020-12-08 MED ORDER — LACTATED RINGERS IV SOLN: INTRAVENOUS | Status: DC

## 2020-12-08 MED ORDER — LIDOCAINE 2% (20 MG/ML) 5 ML SYRINGE
INTRAMUSCULAR | Status: AC
  Filled 2020-12-08: qty 5

## 2020-12-08 MED ORDER — ACETAMINOPHEN 160 MG/5ML PO SOLN: 325.0000 mg | ORAL | Status: DC | PRN

## 2020-12-08 MED ORDER — FENTANYL CITRATE (PF) 100 MCG/2ML IJ SOLN
25.0000 ug | INTRAMUSCULAR | Status: DC | PRN
  Administered 2020-12-08 (×2): 50 ug via INTRAVENOUS

## 2020-12-08 MED ORDER — POTASSIUM CHLORIDE CRYS ER 20 MEQ PO TBCR
20.0000 meq | EXTENDED_RELEASE_TABLET | ORAL | Status: AC
Start: 2020-12-08 — End: 2020-12-08
  Administered 2020-12-08: 20 meq via ORAL
  Filled 2020-12-08: qty 1

## 2020-12-08 MED ORDER — ONDANSETRON HCL 4 MG/2ML IJ SOLN
INTRAMUSCULAR | Status: DC | PRN
Start: 2020-12-08 — End: 2020-12-08
  Administered 2020-12-08: 4 mg via INTRAVENOUS

## 2020-12-08 MED ORDER — ROCURONIUM BROMIDE 10 MG/ML (PF) SYRINGE
PREFILLED_SYRINGE | INTRAVENOUS | Status: DC | PRN
  Administered 2020-12-08: 40 mg via INTRAVENOUS

## 2020-12-08 MED ORDER — PHENOL 1.4 % MT LIQD: 1.0000 | OROMUCOSAL | Status: DC | PRN

## 2020-12-08 MED ORDER — EPHEDRINE SULFATE-NACL 50-0.9 MG/10ML-% IV SOSY
PREFILLED_SYRINGE | INTRAVENOUS | Status: DC | PRN
  Administered 2020-12-08: 15 mg via INTRAVENOUS
  Administered 2020-12-08: 10 mg via INTRAVENOUS

## 2020-12-08 MED ORDER — OXYCODONE HCL 5 MG PO TABS
5.0000 mg | ORAL_TABLET | Freq: Once | ORAL | Status: DC | PRN
Start: 2020-12-08 — End: 2020-12-08

## 2020-12-08 MED ORDER — FENTANYL CITRATE (PF) 250 MCG/5ML IJ SOLN
INTRAMUSCULAR | Status: AC
  Filled 2020-12-08: qty 5

## 2020-12-08 MED ORDER — MIDAZOLAM HCL 2 MG/2ML IJ SOLN
INTRAMUSCULAR | Status: AC
  Filled 2020-12-08: qty 2

## 2020-12-08 MED ORDER — PHENYLEPHRINE 40 MCG/ML (10ML) SYRINGE FOR IV PUSH (FOR BLOOD PRESSURE SUPPORT)
PREFILLED_SYRINGE | INTRAVENOUS | Status: AC
  Filled 2020-12-08: qty 10

## 2020-12-08 MED ORDER — ACETAMINOPHEN 10 MG/ML IV SOLN
INTRAVENOUS | Status: AC
  Filled 2020-12-08: qty 100

## 2020-12-08 MED ORDER — ONDANSETRON HCL 4 MG/2ML IJ SOLN
4.0000 mg | Freq: Four times a day (QID) | INTRAMUSCULAR | Status: DC | PRN
Start: 2020-12-08 — End: 2020-12-15

## 2020-12-08 MED ORDER — MAGNESIUM SULFATE 2 GM/50ML IV SOLN
2.0000 g | Freq: Every day | INTRAVENOUS | Status: DC | PRN
  Filled 2020-12-08: qty 50

## 2020-12-08 MED ORDER — MIDAZOLAM HCL 2 MG/2ML IJ SOLN
INTRAMUSCULAR | Status: DC | PRN
  Administered 2020-12-08: 2 mg via INTRAVENOUS

## 2020-12-08 MED ORDER — LIDOCAINE 2% (20 MG/ML) 5 ML SYRINGE
INTRAMUSCULAR | Status: DC | PRN
  Administered 2020-12-08: 60 mg via INTRAVENOUS

## 2020-12-08 MED ORDER — CHLORHEXIDINE GLUCONATE 0.12 % MT SOLN: 15.0000 mL | Freq: Once | OROMUCOSAL | Status: AC

## 2020-12-08 MED ORDER — GUAIFENESIN-DM 100-10 MG/5ML PO SYRP: 15.0000 mL | ORAL_SOLUTION | ORAL | Status: DC | PRN

## 2020-12-08 MED ORDER — PHENYLEPHRINE 40 MCG/ML (10ML) SYRINGE FOR IV PUSH (FOR BLOOD PRESSURE SUPPORT)
PREFILLED_SYRINGE | INTRAVENOUS | Status: DC | PRN
  Administered 2020-12-08 (×2): 160 ug via INTRAVENOUS
  Administered 2020-12-08: 120 ug via INTRAVENOUS
  Administered 2020-12-08 (×2): 80 ug via INTRAVENOUS
  Administered 2020-12-08: 120 ug via INTRAVENOUS

## 2020-12-08 MED ORDER — FENTANYL CITRATE (PF) 100 MCG/2ML IJ SOLN
INTRAMUSCULAR | Status: AC
  Filled 2020-12-08: qty 2

## 2020-12-08 SURGICAL SUPPLY — 66 items
BAG COUNTER SPONGE SURGICOUNT (BAG) ×3 IMPLANT
BAG SPNG CNTER NS LX DISP (BAG) ×2
BANDAGE ESMARK 6X9 LF (GAUZE/BANDAGES/DRESSINGS) ×2 IMPLANT
BLADE SAW RECIP 87.9 MT (BLADE) ×3 IMPLANT
BNDG CMPR 9X6 STRL LF SNTH (GAUZE/BANDAGES/DRESSINGS) ×2
BNDG COHESIVE 6X5 TAN STRL LF (GAUZE/BANDAGES/DRESSINGS) ×3 IMPLANT
BNDG ELASTIC 4X5.8 VLCR STR LF (GAUZE/BANDAGES/DRESSINGS) ×6 IMPLANT
BNDG ELASTIC 6X5.8 VLCR STR LF (GAUZE/BANDAGES/DRESSINGS) ×2 IMPLANT
BNDG ESMARK 6X9 LF (GAUZE/BANDAGES/DRESSINGS) ×3
BNDG GAUZE ELAST 4 BULKY (GAUZE/BANDAGES/DRESSINGS) ×3 IMPLANT
CANISTER SUCT 3000ML PPV (MISCELLANEOUS) ×3 IMPLANT
COVER SURGICAL LIGHT HANDLE (MISCELLANEOUS) ×3 IMPLANT
CUFF TOURN SGL QUICK 18X4 (TOURNIQUET CUFF) IMPLANT
CUFF TOURN SGL QUICK 24 (TOURNIQUET CUFF)
CUFF TOURN SGL QUICK 34 (TOURNIQUET CUFF)
CUFF TOURN SGL QUICK 42 (TOURNIQUET CUFF) IMPLANT
CUFF TRNQT CYL 24X4X16.5-23 (TOURNIQUET CUFF) IMPLANT
CUFF TRNQT CYL 34X4.125X (TOURNIQUET CUFF) IMPLANT
DRAIN CHANNEL 19F RND (DRAIN) IMPLANT
DRAPE HALF SHEET 40X57 (DRAPES) ×3 IMPLANT
DRAPE INCISE IOBAN 66X45 STRL (DRAPES) ×3 IMPLANT
DRAPE ORTHO SPLIT 77X108 STRL (DRAPES) ×6
DRAPE SURG ORHT 6 SPLT 77X108 (DRAPES) ×4 IMPLANT
DRAPE U-SHAPE 47X51 STRL (DRAPES) ×1 IMPLANT
DRSG ADAPTIC 3X8 NADH LF (GAUZE/BANDAGES/DRESSINGS) ×3 IMPLANT
DRSG VAC ATS LRG SENSATRAC (GAUZE/BANDAGES/DRESSINGS) IMPLANT
DRSG VAC ATS MED SENSATRAC (GAUZE/BANDAGES/DRESSINGS) IMPLANT
ELECT CAUTERY BLADE 6.4 (BLADE) ×3 IMPLANT
ELECT REM PT RETURN 9FT ADLT (ELECTROSURGICAL) ×3
ELECTRODE REM PT RTRN 9FT ADLT (ELECTROSURGICAL) ×2 IMPLANT
EVACUATOR SILICONE 100CC (DRAIN) IMPLANT
GAUZE SPONGE 4X4 12PLY STRL (GAUZE/BANDAGES/DRESSINGS) ×3 IMPLANT
GLOVE SRG 8 PF TXTR STRL LF DI (GLOVE) ×2 IMPLANT
GLOVE SURG ENC MOIS LTX SZ7.5 (GLOVE) ×3 IMPLANT
GLOVE SURG POLY ORTHO LF SZ7.5 (GLOVE) IMPLANT
GLOVE SURG UNDER LTX SZ8 (GLOVE) ×3 IMPLANT
GLOVE SURG UNDER POLY LF SZ7.5 (GLOVE) ×3 IMPLANT
GLOVE SURG UNDER POLY LF SZ8 (GLOVE) ×3
GOWN STRL REUS W/ TWL LRG LVL3 (GOWN DISPOSABLE) ×6 IMPLANT
GOWN STRL REUS W/TWL LRG LVL3 (GOWN DISPOSABLE) ×9
HANDPIECE INTERPULSE COAX TIP (DISPOSABLE)
IV NS IRRIG 3000ML ARTHROMATIC (IV SOLUTION) IMPLANT
KIT BASIN OR (CUSTOM PROCEDURE TRAY) ×3 IMPLANT
KIT TURNOVER KIT B (KITS) ×3 IMPLANT
MANIFOLD NEPTUNE II (INSTRUMENTS) IMPLANT
NS IRRIG 1000ML POUR BTL (IV SOLUTION) ×3 IMPLANT
PACK GENERAL/GYN (CUSTOM PROCEDURE TRAY) ×3 IMPLANT
PACK UNIVERSAL I (CUSTOM PROCEDURE TRAY) IMPLANT
PAD ARMBOARD 7.5X6 YLW CONV (MISCELLANEOUS) ×6 IMPLANT
PAD NEG PRESSURE SENSATRAC (MISCELLANEOUS) ×3 IMPLANT
RASP HELIOCORDIAL MED (MISCELLANEOUS) IMPLANT
SET HNDPC FAN SPRY TIP SCT (DISPOSABLE) IMPLANT
STAPLER VISISTAT 35W (STAPLE) ×3 IMPLANT
STOCKINETTE IMPERVIOUS LG (DRAPES) ×3 IMPLANT
SUT ETHILON 3 0 PS 1 (SUTURE) IMPLANT
SUT SILK 0 TIES 10X30 (SUTURE) ×3 IMPLANT
SUT SILK 2 0 (SUTURE) ×3
SUT SILK 2 0 SH CR/8 (SUTURE) ×3 IMPLANT
SUT SILK 2-0 18XBRD TIE 12 (SUTURE) ×2 IMPLANT
SUT SILK 3 0 (SUTURE) ×3
SUT SILK 3-0 18XBRD TIE 12 (SUTURE) ×2 IMPLANT
SUT VIC AB 2-0 CT1 18 (SUTURE) ×5 IMPLANT
TOWEL GREEN STERILE (TOWEL DISPOSABLE) ×6 IMPLANT
TOWEL GREEN STERILE FF (TOWEL DISPOSABLE) ×3 IMPLANT
UNDERPAD 30X36 HEAVY ABSORB (UNDERPADS AND DIAPERS) ×3 IMPLANT
WATER STERILE IRR 1000ML POUR (IV SOLUTION) ×3 IMPLANT

## 2020-12-08 NOTE — Social Work (Signed)
Outpatient Heart and Vascular LCSW received call from [facility], pt sister. Was able to provide her with pt room and floor number at [facility]. Explained how she could call into the room and do check in, cautioned he may need rest after procedure. She states she doesn't think she will go to hospital but that other family will. I let her know I would message inpatient [facility] team and let them know about previous [facility] visits and how to get in touch w/ sister if needed. Appreciate Heather, RNCM, and inpatient [facility] team coordination; remain available as needed for outpatient needs. Will communicate w/ inpatient team if any additional questions/concerns.   Benjamin [name], MSW, LCSW [facility] Heart/Vascular Care Navigation  [phone]

## 2020-12-08 NOTE — Progress Notes (Signed)
PROGRESS NOTE  Benjamin [name]  DOB: 1960-03-07  PCP: Arnette Felts, FNP PJA:[id number]  DOA: 12/07/2020  LOS: 1 day  Hospital Day: 2   Chief Complaint  Patient presents with   Wound Infection   Brief narrative: Benjamin [name] is a 60 y.o. male with PMH significant for HTN, HLD, heavy smoking, peripheral artery disease s/p bilateral femoral artery bypass graft who underwent left above-knee amputation, removal of infected left femoral-popliteal bypass graft, placement of antibiotic beads by vascular surgery on 10/04/2020 and was discharged to home with p.o. Bactrim for 6 weeks, oxycodone for pain as well as wound VAC and home health services.   Patient presented to the ED from home today with significant drainage from the amputation stump with possible bone exposure.  He has the symptoms ongoing for last several days.  He was seen earlier in the month by vascular surgeon Dr. Randie Heinz with ongoing discussion about additional surgery, had amputation and further courses of antibiotics.  Patient was reluctant to come to the hospital despite multiple requests by family and home health RN.   In the ED, patient was afebrile, heart rate 120s, blood pressure 170/108, respiratory rate 22 on room air 99% O2 sat Labs with WBC 10.7, hemoglobin 9.3, platelets 621, BUN/creatinine 11/0.64, albumin 2.6, lactic acid 1.2 Left femur x-ray showed above-knee amputation status on the left without evidence of osteomyelitis but possible soft tissue infection. Vascular surgery consultation was obtained.  Hospitalist service consulted for inpatient admission and management. See below for details  Subjective: Patient was seen and examined this morning prior to procedure. As of now, patient is undergoing revision of left AKA amputation by vascular surgery.  Assessment/Plan: Infected left AKA amputation stump -On 8/14, patient underwent bilateral femoral artery bypass graft who underwent left above-knee amputation, removal  of infected left femoral-popliteal bypass graft, placement of antibiotic beads by vascular surgery.  He was discharged to home with p.o. Bactrim for 6 weeks, oxycodone for pain as well as wound VAC and home health services. -Presented again with infected stump site. -X-ray did not show evidence of osteomyelitis. -Currently afebrile, mildly elevated WBC count. -IV Zosyn was started in the ED.  Continue the same. Recent Labs  Lab 12/07/20 1441 12/08/20 0040  WBC 10.7* 11.4*  LATICACIDVEN 1.2  --     Peripheral artery disease Hyperlipidemia -Continue Plavix, Lipitor -Tramadol for pain.   Essential hypertension -Currently losartan is on hold in anticipation of procedure.  Hydralazine IV as needed   Heavy smoking -1 pack/day smoker.  Nicotine patch offered.  Counseled to quit.  Mobility: PT eval postprocedure Code Status:   Code Status: Full Code  Nutritional status: Body mass index is 21.93 kg/m.     Diet:  Diet Order             Diet regular Room service appropriate? Yes; Fluid consistency: Thin  Diet effective now                  DVT prophylaxis:  heparin injection 5,000 Units Start: 12/08/20 2200 SCD's Start: 12/08/20 1322   Antimicrobials: IV Zosyn Fluid: NS at 75 mill per hour Consultants: Vascular surgery Family Communication: Not at bedside  Status is: Inpatient  Remains inpatient appropriate because: POD 0  Dispo: The patient is from: Home              Anticipated d/c is to: Pending PT eval              Patient currently  is not medically stable to d/c.   Difficult to place patient No     Infusions:   sodium chloride 75 mL/hr at 12/07/20 1951   magnesium sulfate bolus IVPB     piperacillin-tazobactam (ZOSYN)  IV 3.375 g (12/08/20 0537)    Scheduled Meds:  albuterol  2.5 mg Nebulization Q6H   atorvastatin  20 mg Oral Daily   clopidogrel  75 mg Oral Daily   [START ON 12/09/2020] docusate sodium  100 mg Oral Daily   heparin  5,000 Units  Subcutaneous Q8H   nicotine  21 mg Transdermal Daily   pantoprazole  40 mg Oral Daily   potassium chloride  20 mEq Oral Q4H   senna  1 tablet Oral BID    Antimicrobials: Anti-infectives (From admission, onward)    Start     Dose/Rate Route Frequency Ordered Stop   12/07/20 1730  piperacillin-tazobactam (ZOSYN) IVPB 3.375 g        3.375 g 12.5 mL/hr over 240 Minutes Intravenous Every 8 hours 12/07/20 1721         PRN meds: acetaminophen **OR** acetaminophen, bisacodyl, guaiFENesin-dextromethorphan, hydrALAZINE, magnesium sulfate bolus IVPB, morphine injection, ondansetron, oxyCODONE-acetaminophen, phenol, potassium chloride, traMADol   Objective: Vitals:   12/08/20 1235 12/08/20 1250  BP: (!) 144/84 (!) 163/97  Pulse: (!) 110 (!) 112  Resp: 17 15  Temp:  (!) 97.3 F (36.3 C)  SpO2: 94% 94%    Intake/Output Summary (Last 24 hours) at 12/08/2020 1322 Last data filed at 12/08/2020 1200 Gross per 24 hour  Intake 1100 ml  Output 150 ml  Net 950 ml   Filed Weights   12/07/20 1415 12/08/20 0922  Weight: 68 kg 63.5 kg   Weight change:  Body mass index is 21.93 kg/m.   Physical Exam: General exam: Pleasant middle-aged African-American male.  Not in distress Skin: No rashes, lesions or ulcers. HEENT: Atraumatic, normocephalic, no obvious bleeding Lungs: Clear to auscultation bilaterally CVS: Regular rate and rhythm, no murmur GI/Abd soft, nontender, nondistended, bowel sound present CNS: Alert, awake, oriented x3 Psychiatry: Mood appropriate Extremities: Right leg with trace edema, left leg with intact distal amputation stump.  Data Review: I have personally reviewed the laboratory data and studies available.  Recent Labs  Lab 12/07/20 1441 12/08/20 0040  WBC 10.7* 11.4*  NEUTROABS 9.2*  --   HGB 9.3* 9.0*  HCT 27.6* 27.4*  MCV 75.4* 77.4*  PLT 621* 542*   Recent Labs  Lab 12/07/20 1441 12/08/20 0040  NA 138 135  K 3.9 3.3*  CL 105 102  CO2 25 25   GLUCOSE 108* 100*  BUN 11 10  CREATININE 0.64 0.64  CALCIUM 8.5* 8.4*    F/u labs ordered Unresulted Labs (From admission, onward)     Start     Ordered   12/09/20 0500  CBC  Daily,   R      12/08/20 1322   12/08/20 1111  Aerobic/Anaerobic Culture w Gram Stain (surgical/deep wound)  RELEASE UPON ORDERING,   TIMED       Comments: Specimen A: Phone [phone] Immunocompromised?  No  Antibiotic Treatment:  ZINACEF Is the patient on airborne/droplet precautions? No Clinical History:  INFECTED LEFT AMPUTATION Special Instructions:  none Specimen Disposition:  Microbiology     12/08/20 1111   12/08/20 0500  Basic metabolic panel  Daily,   R      12/07/20 1842   12/08/20 0500  CBC  Daily,   R  12/07/20 1842            Signed, Lorin Glass, MD [facility] 12/08/2020

## 2020-12-08 NOTE — Op Note (Signed)
    NAME: [name]    MRN: [id number] DOB: 03-15-1960    DATE OF OPERATION: 12/08/2020  PREOP DIAGNOSIS:    Nonhealing left AKA  POSTOP DIAGNOSIS:    Same  PROCEDURE:    Revision of left above-the-knee amputation  SURGEON: Di Kindle. Edilia Bo, MD  ASSIST: Doreatha Massed, PA  ANESTHESIA: General  EBL: 100 cc  INDICATIONS:    [name] is a 60 y.o. male who had multilevel arterial occlusive disease.  His left above-the-knee amputation was not healing and her only remaining option was revision to a fairly short left above-the-knee amputation.  FINDINGS:   The muscle appeared adequately perfused at this level.  There was some purulent material and a culture was sent.  The wound was thoroughly irrigated.  TECHNIQUE:   The patient was brought to the operating room and received a general anesthetic.  The left leg was prepped and draped in usual sterile fashion.  There was not enough room to place a tourniquet.  A fishmouth incision was marked halfway between the previous amputation site in the groin.  The dissection was carried down through the skin with a knife and then down through the subcutaneous tissue fascia and muscle using electrocautery for hemostasis.  The muscle was divided circumferentially around the femur.  The femoral artery and veins were individually suture ligated.  The periosteum was elevated and the bone divided proximal to the level of skin division.  The edges of the bone were rasped.  Additional hemostasis was obtained using electrocautery and 2-0 silk sutures.  The wound was thoroughly irrigated.  I closed a fascial layer around the femur.  I then closed the outer fascial layer with interrupted 2-0 Vicryl's.  The skin was closed with staples.  A sterile dressing was applied.  The patient tolerated the procedure well was transferred to recovery room in stable condition.  All needle and sponge counts were correct.  Given the complexity of the case a first  assistant was necessary in order to expedient the procedure and safely perform the technical aspects of the operation.  Waverly Ferrari, MD, FACS [facility]  DATE OF DICTATION:   12/08/2020

## 2020-12-08 NOTE — Transfer of Care (Signed)
Immediate Anesthesia Transfer of Care Note  Patient: KEWAN MCNEASE  Procedure(s) Performed: RIGHT ABOVE KNEE AMPUTATION REVISION (Left: Leg Upper)  Patient Location: PACU  Anesthesia Type:General  Level of Consciousness: awake and alert   Airway & Oxygen Therapy: Patient Spontanous Breathing and Patient connected to face mask oxygen  Post-op Assessment: Report given to RN  Post vital signs: Reviewed and stable  Last Vitals:  Vitals Value Taken Time  BP 146/100 12/08/20 1219  Temp    Pulse 117 12/08/20 1220  Resp 17 12/08/20 1218  SpO2 97 % 12/08/20 1220  Vitals shown include unvalidated device data.  Last Pain:  Vitals:   12/08/20 0937  TempSrc:   PainSc: 7          Complications: No notable events documented.

## 2020-12-08 NOTE — Interval H&P Note (Signed)
History and Physical Interval Note:  12/08/2020 9:33 AM  Benjamin [name]  has presented today for surgery, with the diagnosis of INFECTION.  The various methods of treatment have been discussed with the patient and family. After consideration of risks, benefits and other options for treatment, the patient has consented to  Procedure(s): ABOVE KNEE AMPUTATION REVISION (Left) APPLICATION OF WOUND VAC (Left) as a surgical intervention.  The patient's history has been reviewed, patient examined, no change in status, stable for surgery.  I have reviewed the patient's chart and labs.  Questions were answered to the patient's satisfaction.     Waverly Ferrari

## 2020-12-08 NOTE — ED Notes (Signed)
Obtained consent for procedure. Report called to short stay. All pt belongings- shirt, pants, belt, shoes placed in pt belonging bag

## 2020-12-08 NOTE — Anesthesia Preprocedure Evaluation (Signed)
Anesthesia Evaluation  Patient identified by MRN, date of birth, ID band Patient awake    Reviewed: Allergy & Precautions, NPO status , Patient's Chart, lab work & pertinent test results  Airway Mallampati: I  TM Distance: >3 FB Neck ROM: Full    Dental  (+) Edentulous Upper, Edentulous Lower   Pulmonary Current Smoker and Patient abstained from smoking.,    breath sounds clear to auscultation       Cardiovascular hypertension, Pt. on medications + Peripheral Vascular Disease   Rhythm:Regular Rate:Normal     Neuro/Psych negative neurological ROS  negative psych ROS   GI/Hepatic negative GI ROS, Neg liver ROS,   Endo/Other  negative endocrine ROS  Renal/GU negative Renal ROS  negative genitourinary   Musculoskeletal negative musculoskeletal ROS (+)   Abdominal Normal abdominal exam  (+)   Peds  Hematology negative hematology ROS (+)   Anesthesia Other Findings   Reproductive/Obstetrics                             Anesthesia Physical Anesthesia Plan  ASA: 3  Anesthesia Plan: General   Post-op Pain Management:    Induction: Intravenous  PONV Risk Score and Plan: 2 and Ondansetron and Dexamethasone  Airway Management Planned: Oral ETT  Additional Equipment: None  Intra-op Plan:   Post-operative Plan: Extubation in OR  Informed Consent: I have reviewed the patients History and Physical, chart, labs and discussed the procedure including the risks, benefits and alternatives for the proposed anesthesia with the patient or authorized representative who has indicated his/her understanding and acceptance.     Dental advisory given  Plan Discussed with: CRNA  Anesthesia Plan Comments:         Anesthesia Quick Evaluation

## 2020-12-08 NOTE — Anesthesia Procedure Notes (Signed)
Procedure Name: Intubation Date/Time: 12/08/2020 10:41 AM Performed by: Barrington Ellison, CRNA Pre-anesthesia Checklist: Patient identified, Emergency Drugs available, Suction available and Patient being monitored Patient Re-evaluated:Patient Re-evaluated prior to induction Oxygen Delivery Method: Circle System Utilized Preoxygenation: Pre-oxygenation with 100% oxygen Induction Type: IV induction Ventilation: Mask ventilation without difficulty Laryngoscope Size: Mac and 3 Grade View: Grade I Tube type: Oral Tube size: 7.5 mm Number of attempts: 1 Airway Equipment and Method: Stylet and Oral airway Placement Confirmation: ETT inserted through vocal cords under direct vision, positive ETCO2 and breath sounds checked- equal and bilateral Secured at: 22 cm Tube secured with: Tape Dental Injury: Teeth and Oropharynx as per pre-operative assessment

## 2020-12-08 NOTE — Care Management (Signed)
Contacted [name] with [facility]. Patient's episode for home health has ended and they are unable to take him back at this time.   [facility]20 at 0730 am

## 2020-12-09 ENCOUNTER — Ambulatory Visit: Payer: Medicaid Other | Admitting: Vascular Surgery

## 2020-12-09 ENCOUNTER — Telehealth: Payer: Self-pay | Admitting: Licensed Clinical Social Worker

## 2020-12-09 ENCOUNTER — Ambulatory Visit: Payer: Self-pay

## 2020-12-09 ENCOUNTER — Encounter (HOSPITAL_COMMUNITY): Payer: Self-pay | Admitting: Vascular Surgery

## 2020-12-09 DIAGNOSIS — L0390 Cellulitis, unspecified: Secondary | ICD-10-CM | POA: Diagnosis not present

## 2020-12-09 DIAGNOSIS — E43 Unspecified severe protein-calorie malnutrition: Secondary | ICD-10-CM | POA: Insufficient documentation

## 2020-12-09 LAB — CBC
HCT: 21.7 % — ABNORMAL LOW (ref 39.0–52.0)
Hemoglobin: 7.4 g/dL — ABNORMAL LOW (ref 13.0–17.0)
MCH: 25.3 pg — ABNORMAL LOW (ref 26.0–34.0)
MCHC: 34.1 g/dL (ref 30.0–36.0)
MCV: 74.3 fL — ABNORMAL LOW (ref 80.0–100.0)
Platelets: 502 10*3/uL — ABNORMAL HIGH (ref 150–400)
RBC: 2.92 MIL/uL — ABNORMAL LOW (ref 4.22–5.81)
RDW: 21.1 % — ABNORMAL HIGH (ref 11.5–15.5)
WBC: 13 10*3/uL — ABNORMAL HIGH (ref 4.0–10.5)
nRBC: 0 % (ref 0.0–0.2)

## 2020-12-09 LAB — BASIC METABOLIC PANEL
Anion gap: 7 (ref 5–15)
BUN: 8 mg/dL (ref 8–23)
CO2: 23 mmol/L (ref 22–32)
Calcium: 7.9 mg/dL — ABNORMAL LOW (ref 8.9–10.3)
Chloride: 96 mmol/L — ABNORMAL LOW (ref 98–111)
Creatinine, Ser: 0.67 mg/dL (ref 0.61–1.24)
GFR, Estimated: >60 mL/min (ref >60)
Glucose, Bld: 109 mg/dL — ABNORMAL HIGH (ref 70–99)
Potassium: 3.7 mmol/L (ref 3.5–5.1)
Sodium: 126 mmol/L — ABNORMAL LOW (ref 135–145)

## 2020-12-09 MED ORDER — ADULT MULTIVITAMIN W/MINERALS CH
1.0000 | ORAL_TABLET | Freq: Every day | ORAL | Status: DC
  Administered 2020-12-10 – 2020-12-15 (×6): 1 via ORAL
  Filled 2020-12-09 (×6): qty 1

## 2020-12-09 MED ORDER — ZINC SULFATE 220 (50 ZN) MG PO CAPS
220.0000 mg | ORAL_CAPSULE | Freq: Every day | ORAL | Status: DC
  Administered 2020-12-10 – 2020-12-15 (×6): 220 mg via ORAL
  Filled 2020-12-09 (×6): qty 1

## 2020-12-09 MED ORDER — ENSURE ENLIVE PO LIQD
237.0000 mL | Freq: Two times a day (BID) | ORAL | Status: DC
  Administered 2020-12-10 – 2020-12-15 (×10): 237 mL via ORAL

## 2020-12-09 MED ORDER — METHOCARBAMOL 500 MG PO TABS
500.0000 mg | ORAL_TABLET | Freq: Four times a day (QID) | ORAL | Status: DC | PRN
  Administered 2020-12-09 – 2020-12-15 (×15): 500 mg via ORAL
  Filled 2020-12-09 (×15): qty 1

## 2020-12-09 MED ORDER — ASCORBIC ACID 500 MG PO TABS
500.0000 mg | ORAL_TABLET | Freq: Every day | ORAL | Status: DC
  Administered 2020-12-10 – 2020-12-15 (×6): 500 mg via ORAL
  Filled 2020-12-09 (×6): qty 1

## 2020-12-09 NOTE — Progress Notes (Signed)
PROGRESS NOTE  Benjamin [name]  DOB: 12-17-1960  PCP: Arnette Felts, FNP MBW:[id number]  DOA: 12/07/2020  LOS: 2 days  Hospital Day: 3   Chief Complaint  Patient presents with   Wound Infection   Brief narrative: Benjamin [name] is a 60 y.o. male with PMH significant for HTN, HLD, heavy smoking, peripheral artery disease s/p bilateral femoral artery bypass graft who underwent left above-knee amputation, removal of infected left femoral-popliteal bypass graft, placement of antibiotic beads by vascular surgery on 10/04/2020 and was discharged to home with p.o. Bactrim for 6 weeks, oxycodone for pain as well as wound VAC and home health services.   Patient presented to the ED from home today with significant drainage from the amputation stump with possible bone exposure.  He has the symptoms ongoing for last several days.  He was seen earlier in the month by vascular surgeon Dr. Randie Heinz with ongoing discussion about additional surgery, had amputation and further courses of antibiotics.  Patient was reluctant to come to the hospital despite multiple requests by family and home health RN.   In the ED, patient was afebrile, heart rate 120s, blood pressure 170/108, respiratory rate 22 on room air 99% O2 sat Labs with WBC 10.7, hemoglobin 9.3, platelets 621, BUN/creatinine 11/0.64, albumin 2.6, lactic acid 1.2 Left femur x-ray showed above-knee amputation status on the left without evidence of osteomyelitis but possible soft tissue infection. Vascular surgery consultation was obtained.  Hospitalist service consulted for inpatient admission and management. See below for details  Subjective: Patient was seen and examined this afternoon. Sitting up in bed.  Not in distress no new symptoms.  Pain controlled.  Left amputation stump has bandage on.  Assessment/Plan: Infected left AKA amputation stump -Admitted for infected stump site. -On 10/18, patient underwent revision of left above-knee amputation.   Purulent material was sent for culture. -Initial x-ray did not show evidence of osteomyelitis. -Currently on IV Zosyn. -Remains afebrile, WBC count up today probably because of stress from surgery. Recent Labs  Lab 12/07/20 1441 12/08/20 0040 12/09/20 0254  WBC 10.7* 11.4* 13.0*  LATICACIDVEN 1.2  --   --      Peripheral artery disease Hyperlipidemia -Continue Plavix, Lipitor -Tramadol for pain.   Essential hypertension -Currently losartan is on hold in anticipation of procedure.  Hydralazine IV as needed.  We will plan to resume losartan from tomorrow.   Heavy smoking -1 pack/day smoker.  Nicotine patch offered.  Counseled to quit.  Mobility: PT eval postprocedure Code Status:   Code Status: Full Code  Nutritional status: Body mass index is 21.93 kg/m. Nutrition Problem: Severe Malnutrition Etiology: chronic illness (PAD, Wound healing) Signs/Symptoms: severe fat depletion, severe muscle depletion Diet:  Diet Order             Diet regular Room service appropriate? Yes; Fluid consistency: Thin  Diet effective now                  DVT prophylaxis:  heparin injection 5,000 Units Start: 12/08/20 2200 SCD's Start: 12/08/20 1322   Antimicrobials: IV Zosyn Fluid: Stop fluid today. Consultants: Vascular surgery Family Communication: Not at bedside  Status is: Inpatient  Remains inpatient appropriate because: POD 1  Dispo: The patient is from: Home              Anticipated d/c is to: Pending PT eval              Patient currently is not medically stable to d/c.  Difficult to place patient No     Infusions:   sodium chloride 75 mL/hr at 12/08/20 2232   magnesium sulfate bolus IVPB     piperacillin-tazobactam (ZOSYN)  IV 3.375 g (12/09/20 1335)    Scheduled Meds:  vitamin C  500 mg Oral Daily   atorvastatin  20 mg Oral Daily   clopidogrel  75 mg Oral Daily   docusate sodium  100 mg Oral Daily   feeding supplement  237 mL Oral BID BM   heparin   5,000 Units Subcutaneous Q8H   multivitamin with minerals  1 tablet Oral Daily   nicotine  21 mg Transdermal Daily   pantoprazole  40 mg Oral Daily   senna  1 tablet Oral BID   zinc sulfate  220 mg Oral Daily    Antimicrobials: Anti-infectives (From admission, onward)    Start     Dose/Rate Route Frequency Ordered Stop   12/07/20 1730  piperacillin-tazobactam (ZOSYN) IVPB 3.375 g        3.375 g 12.5 mL/hr over 240 Minutes Intravenous Every 8 hours 12/07/20 1721         PRN meds: acetaminophen **OR** acetaminophen, albuterol, bisacodyl, guaiFENesin-dextromethorphan, hydrALAZINE, magnesium sulfate bolus IVPB, methocarbamol, morphine injection, ondansetron, oxyCODONE-acetaminophen, phenol, potassium chloride, traMADol   Objective: Vitals:   12/09/20 0404 12/09/20 0731  BP: (!) 140/95 124/84  Pulse: (!) 102 96  Resp: 18 16  Temp: 99.1 F (37.3 C) 98.4 F (36.9 C)  SpO2: 95% 96%    Intake/Output Summary (Last 24 hours) at 12/09/2020 1551 Last data filed at 12/09/2020 1343 Gross per 24 hour  Intake 2216 ml  Output 3740 ml  Net -1524 ml    Filed Weights   12/07/20 1415 12/08/20 0922  Weight: 68 kg 63.5 kg   Weight change: -4.536 kg Body mass index is 21.93 kg/m.   Physical Exam: General exam: Pleasant middle-aged African-American male.  Not in distress.  Pain controlled. Skin: No rashes, lesions or ulcers. HEENT: Atraumatic, normocephalic, no obvious bleeding Lungs: Clear to auscultation bilaterally CVS: Regular rate and rhythm, no murmur GI/Abd soft, nontender, nondistended, bowel sound present CNS: Alert, awake, oriented x3 Psychiatry: Mood appropriate Extremities: Right leg with trace edema, left leg amputation stump with bandage on.  Data Review: I have personally reviewed the laboratory data and studies available.  Recent Labs  Lab 12/07/20 1441 12/08/20 0040 12/09/20 0254  WBC 10.7* 11.4* 13.0*  NEUTROABS 9.2*  --   --   HGB 9.3* 9.0* 7.4*  HCT  27.6* 27.4* 21.7*  MCV 75.4* 77.4* 74.3*  PLT 621* 542* 502*    Recent Labs  Lab 12/07/20 1441 12/08/20 0040 12/09/20 0254  NA 138 135 126*  K 3.9 3.3* 3.7  CL 105 102 96*  CO2 25 25 23   GLUCOSE 108* 100* 109*  BUN 11 10 8   CREATININE 0.64 0.64 0.67  CALCIUM 8.5* 8.4* 7.9*     F/u labs ordered Unresulted Labs (From admission, onward)     Start     Ordered   12/09/20 1456  Vitamin A  Once,   R        12/09/20 1510   12/09/20 1456  Vitamin C  Once,   R        12/09/20 1510   12/09/20 1456  Zinc  Once,   R        12/09/20 1510   12/09/20 0500  CBC  Daily,   R      12/08/20  1322   12/08/20 0500  Basic metabolic panel  Daily,   R      12/07/20 1842   12/08/20 0500  CBC  Daily,   R      12/07/20 1842            Signed, Lorin Glass, MD [facility] 12/09/2020

## 2020-12-09 NOTE — Progress Notes (Signed)
   VASCULAR SURGERY ASSESSMENT & PLAN:   POD 1 LEFT AKA: Dressing is dry. Dressing change tomorrow.   ID: He had purulent drainage in the proximal thigh. Intraoperative culture shows moderate GPC and few GNR. He is on IV Zosyn. Rocephin was stopped yesterday. Would continue IV ABx for 72 hours.   DVT PROPHYLAXIS: He is on SQ heparin.    SUBJECTIVE:   No complaints  PHYSICAL EXAM:   Vitals:   12/08/20 1341 12/08/20 1939 12/08/20 2358 12/09/20 0404  BP:  (!) 141/87 (!) 138/97 (!) 140/95  Pulse: 87 99 (!) 105 (!) 102  Resp: 20 17 19 18   Temp:  98.1 F (36.7 C) 98.5 F (36.9 C) 99.1 F (37.3 C)  TempSrc:  Oral Oral Oral  SpO2: 98% 100% 100% 95%  Weight:      Height:       The dressing is dry.   LABS:   Lab Results  Component Value Date   WBC 13.0 (H) 12/09/2020   HGB 7.4 (L) 12/09/2020   HCT 21.7 (L) 12/09/2020   MCV 74.3 (L) 12/09/2020   PLT 502 (H) 12/09/2020   Lab Results  Component Value Date   CREATININE 0.67 12/09/2020   Lab Results  Component Value Date   INR 1.1 12/07/2020    PROBLEM LIST:    Active Problems:   Wound cellulitis   CURRENT MEDS:    atorvastatin  20 mg Oral Daily   clopidogrel  75 mg Oral Daily   docusate sodium  100 mg Oral Daily   heparin  5,000 Units Subcutaneous Q8H   nicotine  21 mg Transdermal Daily   pantoprazole  40 mg Oral Daily   senna  1 tablet Oral BID    12/09/2020 Office: [phone] 12/09/2020

## 2020-12-09 NOTE — Progress Notes (Signed)
Inpatient Rehab Admissions Coordinator:   CIR consult received. No therapy notes currently, await recommendations from PT/OT before making an assessment of candidacy for CIR.Marland Kitchen   Megan Salon, MS, CCC-SLP Rehab Admissions Coordinator  [phone] (celll) [phone] (office)

## 2020-12-09 NOTE — Progress Notes (Addendum)
Initial Nutrition Assessment  DOCUMENTATION CODES:   Severe malnutrition in context of chronic illness  INTERVENTION:   Encourage good PO intake  Ensure Enlive po BID, each supplement provides 350 kcal and 20 grams of protein  Multivitamin w/ minerals daily  Recommend checking Vitamin C, Vitamin A and zinc given poor wound healing and pt at risk.   NUTRITION DIAGNOSIS:   Severe Malnutrition related to chronic illness (PAD, Wound healing) as evidenced by severe fat depletion, severe muscle depletion.  GOAL:   Patient will meet greater than or equal to 90% of their needs  MONITOR:   PO intake, Supplement acceptance, Labs, Skin  REASON FOR ASSESSMENT:   Consult Wound healing  ASSESSMENT:   60 y.o. male presented to the ED with drainage from amputation stump and possible bone exposure. PMH include HTN, HLD, and PAD. 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   Pt reports that he has been doing good since he came in, reports that PTA he would have good days and bad days on how he would eat. His typical intake includes: Breakfast: eggs, grits, sausage, cornbread; Lunch: [place] sandwich, orange juice, apple sauce; Dinner: Spaghetti or mac and cheese. Pt reports that he drinks 1 Boost per week.  Per EMR, pt intake includes 100% for lunch and dinner on 10/18. Per pt, he reports that he cannot eat lunch and dinner here because it is dry and cold. Pt reports no issues chewing or swallowing.   Pt reports a UBW of 150# and states that he has had some weight loss, but was unable to determine how much. Based on what pt has reported this is 7% weight loss, but does not have a time frame to match weight loss. Per EMR, pt has fluctuated in weight over the past year.   Discussed ONS with pt, pt prefers Boost but is willing to drink the Ensures. Discussed importance of high calorie, high protein supplements to promote wound healing and prevent further weight loss.    Medications reviewed and include: Colace, Protonix, Senna, IV antibiotics Labs reviewed: Sodium 126  NUTRITION - FOCUSED PHYSICAL EXAM:  Flowsheet Row Most Recent Value  Orbital Region Severe depletion  Upper Arm Region Severe depletion  Thoracic and Lumbar Region Severe depletion  Buccal Region Severe depletion  Temple Region Severe depletion  Clavicle Bone Region Severe depletion  Clavicle and Acromion Bone Region Severe depletion  Scapular Bone Region Severe depletion  Dorsal Hand Severe depletion  Patellar Region Moderate depletion  [L High AKA]  Anterior Thigh Region Moderate depletion  Posterior Calf Region Moderate depletion  Edema (RD Assessment) None  Hair Reviewed  Eyes Reviewed  Mouth Reviewed  Skin Reviewed  Nails Reviewed  [Poor capillary refill]       Diet Order:   Diet Order             Diet regular Room service appropriate? Yes; Fluid consistency: Thin  Diet effective now                   EDUCATION NEEDS:   No education needs have been identified at this time  Skin:  Skin Assessment: Skin Integrity Issues: Skin Integrity Issues:: Incisions Incisions: L Thigh  Last BM:  12/08/2020  Height:   Ht Readings from Last 1 Encounters:  12/08/20 _0  (1.702 m)    Weight:   Wt Readings from Last 1 Encounters:  12/08/20 63.5 kg    Ideal Body Weight:  67.3 kg  BMI:  Body mass index is 21.93 kg/m.  Estimated Nutritional Needs:   Kcal:  1900-2100  Protein:  95-110 grams  Fluid:  >/= 1.9 L    Benjamin [name] BS, PLDN Clinical Dietitian See AMiON for contact information.

## 2020-12-09 NOTE — Evaluation (Signed)
Occupational Therapy Evaluation Patient Details Name: Benjamin [name] MRN: [id number] DOB: August 03, 1960 Today's Date: 12/09/2020   History of Present Illness Pt is 60 y/o M admitted 12/07/20 with necrotic left above-knee amputation site. 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   Clinical Impression   Pt presents with decreased balance/activity tolerance and pain. Pt currently requiring Min A for LB ADLs and Min guard for functional transfers/mobility. Pt reports being independent at baseline and lives with sister who he states will be available to provide assistance at home. Pt would be good candidate for CIR for further rehab prior to return home. Will follow acutely.     Recommendations for follow up therapy are one component of a multi-disciplinary discharge planning process, led by the attending physician.  Recommendations may be updated based on patient status, additional functional criteria and insurance authorization.   Follow Up Recommendations  CIR    Equipment Recommendations  3 in 1 bedside commode    Recommendations for Other Services PT consult     Precautions / Restrictions Precautions Precautions: Fall Restrictions Weight Bearing Restrictions: Yes LLE Weight Bearing: Non weight bearing      Mobility Bed Mobility Overal bed mobility: Needs Assistance Bed Mobility: Supine to Sit     Supine to sit: Supervision          Transfers Overall transfer level: Needs assistance Equipment used: Rolling walker (2 wheeled) Transfers: Sit to/from Stand Sit to Stand: Min guard              Balance Overall balance assessment: Needs assistance Sitting-balance support: No upper extremity supported;Feet supported Sitting balance-Leahy Scale: Good     Standing balance support: Bilateral upper extremity supported;During functional  activity Standing balance-Leahy Scale: Poor                             ADL either performed or assessed with clinical judgement   ADL Overall ADL's : Needs assistance/impaired Eating/Feeding: Independent;Sitting   Grooming: Set up;Sitting   Upper Body Bathing: Independent;Sitting   Lower Body Bathing: Minimal assistance;Sitting/lateral leans   Upper Body Dressing : Set up;Sitting   Lower Body Dressing: Minimal assistance;Sitting/lateral leans;Sit to/from stand   Toilet Transfer: Min guard;Ambulation;BSC;RW   Toileting- Clothing Manipulation and Hygiene: Supervision/safety;Sitting/lateral lean;Sit to/from stand       Functional mobility during ADLs: Min guard;Rolling walker General ADL Comments: Pt limited by balance, pain, and activity tolerance.     Vision   Vision Assessment?: No apparent visual deficits     Perception     Praxis      Pertinent Vitals/Pain Pain Assessment: 0-10 Pain Score: 6  Pain Location: LLE Pain Descriptors / Indicators: Aching;Grimacing;Guarding;Sore Pain Intervention(s): Monitored during session;Limited activity within patient's tolerance;Repositioned     Hand Dominance Right   Extremity/Trunk Assessment Upper Extremity Assessment Upper Extremity Assessment: Overall WFL for tasks assessed   Lower Extremity Assessment Lower Extremity Assessment: Defer to PT evaluation       Communication Communication Communication: No difficulties   Cognition Arousal/Alertness: Awake/alert Behavior During Therapy: WFL for tasks assessed/performed Overall Cognitive Status: Within Functional Limits for tasks assessed                                     General  Comments       Exercises     Shoulder Instructions      Home Living Family/patient expects to be discharged to:: Private residence Living Arrangements: Other relatives;Other (Comment) (sister) Available Help at Discharge: Family;Available 24  hours/day Type of Home: House Home Access: Stairs to enter [employer] of Steps: 4 Entrance Stairs-Rails: Left;Right;Can reach both Home Layout: One level     Bathroom Shower/Tub: Chief Strategy Officer: Standard     Home Equipment: Environmental consultant - 2 wheels;Shower seat;Crutches;Wheelchair - manual;Grab bars - tub/shower          Prior Functioning/Environment Level of Independence: Independent with assistive device(s)                 OT Problem List: Decreased activity tolerance;Impaired balance (sitting and/or standing);Decreased knowledge of precautions;Decreased safety awareness;Decreased knowledge of use of DME or AE;Pain      OT Treatment/Interventions: Self-care/ADL training;Therapeutic exercise;Neuromuscular education;DME and/or AE instruction;Therapeutic activities;Patient/family education;Balance training    OT Goals(Current goals can be found in the care plan section) Acute Rehab OT Goals Patient Stated Goal: return home OT Goal Formulation: With patient Time For Goal Achievement: 12/23/20 Potential to Achieve Goals: Good ADL Goals Pt Will Perform Grooming: with modified independence;standing Pt Will Perform Lower Body Bathing: with modified independence;sitting/lateral leans Pt Will Perform Lower Body Dressing: with modified independence;sitting/lateral leans;sit to/from stand Pt Will Transfer to Toilet: with modified independence;ambulating;bedside commode Pt Will Perform Tub/Shower Transfer: Tub transfer;with modified independence;ambulating;shower seat;rolling walker  OT Frequency: Min 2X/week   Barriers to D/C:            Co-evaluation              AM-PAC OT "6 Clicks" Daily Activity     Outcome Measure Help from another person eating meals?: None Help from another person taking care of personal grooming?: A Little Help from another person toileting, which includes using toliet, bedpan, or urinal?: A Little Help from another  person bathing (including washing, rinsing, drying)?: A Little Help from another person to put on and taking off regular upper body clothing?: A Little Help from another person to put on and taking off regular lower body clothing?: A Little 6 Click Score: 19   End of Session Equipment Utilized During Treatment: Gait belt;Rolling walker Nurse Communication: Mobility status  Activity Tolerance: Patient limited by pain Patient left: in chair;with call bell/phone within reach;with chair alarm set  OT Visit Diagnosis: Unsteadiness on feet (R26.81);Other abnormalities of gait and mobility (R26.89);Pain Pain - Right/Left: Left Pain - part of body: Leg                Time: 1511-1536 OT Time Calculation (min): 25 min Charges:  OT General Charges $OT Visit: 1 Visit OT Evaluation $OT Eval Low Complexity: 1 Low OT Treatments $Self Care/Home Management : 8-22 mins  Yuniel Blaney C, OT/L  Acute Rehab [phone]  Lenice Llamas 12/09/2020, 5:24 PM

## 2020-12-09 NOTE — Evaluation (Signed)
Physical Therapy Evaluation Patient Details Name: Benjamin [name] MRN: [id number] DOB: 04-23-1960 Today's Date: 12/09/2020  History of Present Illness  Pt is 60 y/o M admitted 12/07/20 with necrotic left above-knee amputation site. 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  Clinical Impression  Pt was assessed today for mobility on side of chair to stand and get to bed, with testing of strength and balance, coordination to move and with observation of pain issues.  Pt was provided with meds for pain, but also encouraged to be up to chair and move with staff when able.  Encourage him to ask for help when pain is this severe, as he was in chair and not awaiting staff when PT arrived in his room.   Recommending CIR due to his motivation and willingness to work, and if [facility] cannot accommodate this, hopefully other nearby inpt rehab units can be requested.  Follow for goals of acute PT as listed below.     Recommendations for follow up therapy are one component of a multi-disciplinary discharge planning process, led by the attending physician.  Recommendations may be updated based on patient status, additional functional criteria and insurance authorization.  Follow Up Recommendations CIR    Equipment Recommendations  Rolling walker with 5" wheels    Recommendations for Other Services Rehab consult     Precautions / Restrictions Precautions Precautions: Fall Precaution Comments: monitor for pain on L AK amp Restrictions Weight Bearing Restrictions: Yes LLE Weight Bearing: Non weight bearing      Mobility  Bed Mobility Overal bed mobility: Needs Assistance Bed Mobility: Sit to Supine       Sit to supine: Min assist   General bed mobility comments: min assist to get to bed with more gentle landing on L leg due to pain    Transfers Overall transfer level:  Needs assistance Equipment used: Rolling walker (2 wheeled);1 person hand held assist Transfers: Sit to/from Stand Sit to Stand: Min guard         General transfer comment: pt is aware of correct hand placement on chair arms  Ambulation/Gait Ambulation/Gait assistance: Min guard;Min assist Gait Distance (Feet): 8 Feet Assistive device: Rolling walker (2 wheeled);1 person hand held assist   Gait velocity: reduced   General Gait Details: pt is walking on RW with care due to severe pain on LLE.  Had contact with nursing to bring pain meds during the session to manage his flare before PT arrived  Stairs            Wheelchair Mobility    Modified Rankin (Stroke Patients Only)       Balance Overall balance assessment: Needs assistance Sitting-balance support: Single extremity supported Sitting balance-Leahy Scale: Good Sitting balance - Comments: sits with wide base on hips to steady himself   Standing balance support: Bilateral upper extremity supported;During functional activity Standing balance-Leahy Scale: Poor                               Pertinent Vitals/Pain Pain Assessment: 0-10 Pain Score: 9  Pain Location: LLE Pain Descriptors / Indicators: Aching;Grimacing;Guarding Pain Intervention(s): Monitored during session;Premedicated before session;Repositioned;Patient requesting pain meds-RN notified    Home Living Family/patient expects to be discharged to:: Private residence Living Arrangements: Other relatives;Other (Comment) Available Help at Discharge: Family;Available 24 hours/day Type of  Home: House Home Access: Stairs to enter Entrance Stairs-Rails: Left;Right;Can reach both Entrance Stairs-Number of Steps: 4 Home Layout: One level Home Equipment: Walker - 2 wheels;Shower seat;Crutches;Wheelchair - manual;Grab bars - tub/shower      Prior Function Level of Independence: Independent with assistive device(s)         Comments: RW on  stairs and to walk     Hand Dominance   Dominant Hand: Right    Extremity/Trunk Assessment   Upper Extremity Assessment Upper Extremity Assessment: Defer to OT evaluation    Lower Extremity Assessment Lower Extremity Assessment: RLE deficits/detail RLE Deficits / Details: minor weakness in hip    Cervical / Trunk Assessment Cervical / Trunk Assessment: Normal  Communication   Communication: No difficulties  Cognition Arousal/Alertness: Awake/alert Behavior During Therapy: WFL for tasks assessed/performed Overall Cognitive Status: Within Functional Limits for tasks assessed                                        General Comments General comments (skin integrity, edema, etc.): pt is on chair when PT arrives wiht severe pain on LLE, but ready to go to bed.  Took a short walk with pt demonstrating awareness of how to balance on RW, but is in severe pain that worsens with dependent posture on LLE    Exercises     Assessment/Plan    PT Assessment Patient needs continued PT services  PT Problem List Decreased strength;Decreased range of motion;Decreased activity tolerance;Decreased balance;Decreased mobility;Decreased skin integrity;Pain       PT Treatment Interventions DME instruction;Gait training;Stair training;Functional mobility training;Therapeutic activities;Therapeutic exercise;Balance training;Neuromuscular re-education;Patient/family education    PT Goals (Current goals can be found in the Care Plan section)  Acute Rehab PT Goals Patient Stated Goal: return home PT Goal Formulation: With patient Time For Goal Achievement: 12/23/20 Potential to Achieve Goals: Good    Frequency Min 3X/week   Barriers to discharge Inaccessible home environment;Decreased caregiver support home with older relatives and stairs to enter    Co-evaluation               AM-PAC PT "6 Clicks" Mobility  Outcome Measure Help needed turning from your back to your  side while in a flat bed without using bedrails?: A Little Help needed moving from lying on your back to sitting on the side of a flat bed without using bedrails?: A Little Help needed moving to and from a bed to a chair (including a wheelchair)?: A Little Help needed standing up from a chair using your arms (e.g., wheelchair or bedside chair)?: A Little Help needed to walk in hospital room?: A Little Help needed climbing 3-5 steps with a railing? : A Lot 6 Click Score: 17    End of Session Equipment Utilized During Treatment: Gait belt Activity Tolerance: Patient limited by fatigue;Patient limited by pain Patient left: in bed;with call bell/phone within reach;with bed alarm set;Other (comment) (ice provided for L limb pain) Nurse Communication: Mobility status;Patient requests pain meds;Other (comment) (ice) PT Visit Diagnosis: Unsteadiness on feet (R26.81);Muscle weakness (generalized) (M62.81);Difficulty in walking, not elsewhere classified (R26.2);Pain Pain - Right/Left: Left Pain - part of body: Leg    Time: 9381-0175 PT Time Calculation (min) (ACUTE ONLY): 36 min   Charges:   PT Evaluation $PT Eval Moderate Complexity: 1 Mod PT Treatments $Therapeutic Activity: 8-22 mins       Ivar Drape 12/09/2020,  7:01 PM  Samul Dada, PT PhD Acute Rehab Dept. Number: [facility] [id number] and [facility] [phone]

## 2020-12-09 NOTE — Anesthesia Postprocedure Evaluation (Signed)
Anesthesia Post Note  Patient: Benjamin [name]  Procedure(s) Performed: RIGHT ABOVE KNEE AMPUTATION REVISION (Left: Leg Upper)     Patient location during evaluation: PACU Anesthesia Type: General Level of consciousness: awake and alert Pain management: pain level controlled Vital Signs Assessment: post-procedure vital signs reviewed and stable Respiratory status: spontaneous breathing, nonlabored ventilation, respiratory function stable and patient connected to nasal cannula oxygen Cardiovascular status: blood pressure returned to baseline and stable Postop Assessment: no apparent nausea or vomiting Anesthetic complications: no   No notable events documented.              Shelton Silvas

## 2020-12-09 NOTE — Telephone Encounter (Signed)
Received a call from pt sister requesting updates. LCSW shared that since I am based outpatient that it may be more appropriate for her to contact inpatient floor for any updates. I reviewed chart and shared it looks like pt is waiting on therapy teams to come and evaluate for appropriate next level of care. Pt sister inquiring about whether he "can just go straight from home to rehab." I briefly explained that it would need to be recommended, a facility would have to have a bed available and it would need to be in network with his insurance plan.   I encouraged as I have throughout pt health journey that a pt family member or friend try and visit tomorrow to better understand the recommendations. [name] states no one is available to sit there for a long period time. I shared if she has not heard from a nurse case manager or social worker by tomorrow afternoon she is welcome to call the main desk and request an update.   We also reviewed scheduled [facility] call had been changed to 10/26, provided new time and date to sister. The above was communicated to [facility] who shared that pt unfortunately would not be able to receive further services through [facility] at this time due to compliance challenges. LCSW continues to follow for outpatient coordination as needed.    [name], MSW, LCSW [facility] Heart/Vascular Care Navigation  [phone]

## 2020-12-09 NOTE — TOC Initial Note (Signed)
Transition of Care [facility]) - Initial/Assessment Note    Patient Details  Name: Benjamin [name] MRN: [id number] Date of Birth: [birth year]  [facility]) CM/SW Contact:    Kingsley Plan, RN Phone Number: 12/09/2020, 4:22 PM  Clinical Narrative:                  Spoke to patient at bedside. Confirmed face sheet information. Home phone is the best number to use to reach him.  Patient from home . Lives with sister. Patient reports his sister has MS. Patient was active with [facility] prior to admission.   NCM discussed with [name] with [facility], they are unable to accept patient back at discharge. Patient aware.  Patient states PT has worked with him. Await note and recommendations.     Patient has walker, 3 in 1 and wheel chair at home.   Patient Goals and CMS Choice Patient states their goals for this hospitalization and ongoing recovery are:: to return to home   Choice offered to / list presented to : Patient  Expected Discharge Plan and Services                                                Prior Living Arrangements/Services     Patient language and need for interpreter reviewed:: Yes Do you feel safe going back to the place where you live?: Yes          Current home services: DME Criminal Activity/Legal Involvement Pertinent to Current Situation/Hospitalization: No - Comment as needed  Activities of Daily Living      Permission Sought/Granted   Permission granted to share information with : No              Emotional Assessment       Orientation: : Oriented to Self, Oriented to Place, Oriented to  Time, Oriented to Situation   Psych Involvement: No (comment)  Admission diagnosis:  Wound infection [T14.8XXA, L08.9] Wound cellulitis [L03.90] AKA stump complication (HCC) [T87.9] Patient Active Problem List   Diagnosis Date Noted   Protein-calorie malnutrition, severe 12/09/2020   Wound cellulitis 12/07/2020   Arterial  occlusion 10/01/2020   Ischemia of lower extremity 10/01/2020   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   Femoral-popliteal bypass graft occlusion, 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   PAD (peripheral artery disease) (HCC) 11/13/2019   Callus of foot 07/01/2019   Intermittent claudication (HCC) 01/20/2017   Need for influenza vaccination 01/20/2017   Needs smoking cessation education 09/06/2016   Abnormal echocardiogram 09/05/2016   Abnormal EKG 07/25/2016   Preoperative cardiovascular examination 07/25/2016   Shortness of breath on exertion 07/25/2016   Dyslipidemia, goal LDL below 70 07/25/2016   Uncontrolled hypertension 07/25/2016   Abnormal chest x-ray 07/04/2016   Peripheral vascular disease (HCC) 07/04/2016   Decreased pedal pulses 06/24/2016   Varicose vein of leg 06/24/2016   Heavy smoker 06/24/2016   Abnormal weight loss 06/24/2016   Foot pain, left 06/24/2016   Screening for prostate cancer 06/24/2016   PCP:  Arnette Felts, FNP Pharmacy:   [facility] [address] [place] [address] Phone: [phone] Fax: [phone]  [facility] - [place], [place] - [address] [place] [address] Phone: [phone] Fax: [phone]  [facility] [phone] [place] Otto,  [place] - [address] AT [facility] OF [address] [place] [address] Phone: [phone] Fax: [phone]  [facility] [address] [place] [address] Phone: [phone] Fax: [phone]     Social Determinants of Health (SDOH) Interventions    Readmission Risk Interventions Readmission Risk Prevention Plan 10/16/2020  Transportation Screening Complete  PCP or Specialist Appt within 5-7 Days Complete  Home Care Screening Complete  Medication Review (RN CM) Complete  Some recent data might be hidden

## 2020-12-10 DIAGNOSIS — L0390 Cellulitis, unspecified: Secondary | ICD-10-CM | POA: Diagnosis not present

## 2020-12-10 LAB — BASIC METABOLIC PANEL
Anion gap: 9 (ref 5–15)
BUN: 7 mg/dL — ABNORMAL LOW (ref 8–23)
CO2: 28 mmol/L (ref 22–32)
Calcium: 8.7 mg/dL — ABNORMAL LOW (ref 8.9–10.3)
Chloride: 99 mmol/L (ref 98–111)
Creatinine, Ser: 0.79 mg/dL (ref 0.61–1.24)
GFR, Estimated: >60 mL/min (ref >60)
Glucose, Bld: 124 mg/dL — ABNORMAL HIGH (ref 70–99)
Potassium: 3.9 mmol/L (ref 3.5–5.1)
Sodium: 136 mmol/L (ref 135–145)

## 2020-12-10 LAB — CBC
HCT: 23.5 % — ABNORMAL LOW (ref 39.0–52.0)
Hemoglobin: 7.7 g/dL — ABNORMAL LOW (ref 13.0–17.0)
MCH: 24.4 pg — ABNORMAL LOW (ref 26.0–34.0)
MCHC: 32.8 g/dL (ref 30.0–36.0)
MCV: 74.6 fL — ABNORMAL LOW (ref 80.0–100.0)
Platelets: 578 10*3/uL — ABNORMAL HIGH (ref 150–400)
RBC: 3.15 MIL/uL — ABNORMAL LOW (ref 4.22–5.81)
RDW: 21.3 % — ABNORMAL HIGH (ref 11.5–15.5)
WBC: 11.6 10*3/uL — ABNORMAL HIGH (ref 4.0–10.5)
nRBC: 0 % (ref 0.0–0.2)

## 2020-12-10 MED ORDER — CARVEDILOL 3.125 MG PO TABS
3.1250 mg | ORAL_TABLET | Freq: Two times a day (BID) | ORAL | Status: DC
  Administered 2020-12-10 – 2020-12-11 (×4): 3.125 mg via ORAL
  Filled 2020-12-10 (×5): qty 1

## 2020-12-10 MED ORDER — HYDROXYZINE HCL 25 MG PO TABS
25.0000 mg | ORAL_TABLET | Freq: Once | ORAL | Status: AC
  Administered 2020-12-10: 25 mg via ORAL
  Filled 2020-12-10: qty 1

## 2020-12-10 NOTE — Progress Notes (Signed)
   VASCULAR SURGERY ASSESSMENT & PLAN:   POD 2 LEFT AKA: His AKA is healing nicely. I have written for daily dressing changes.    ID: He had purulent drainage in the proximal thigh. Intraoperative culture shows moderate GPC and few GNR. ID pending. He is on IV Zosyn. Rocephin was stopped yesterday. Would continue IV ABx for another 48 hours.    DVT PROPHYLAXIS: He is on SQ heparin.   PTx recommends CIR.   SUBJECTIVE:   No complaints.  PHYSICAL EXAM:   Vitals:   12/09/20 0731 12/09/20 1633 12/09/20 1936 12/10/20 0500  BP: 124/84 (!) 150/99 (!) 157/94 (!) 145/91  Pulse: 96 (!) 105 (!) 105 (!) 109  Resp: 16 16 17 17   Temp: 98.4 F (36.9 C) 99.1 F (37.3 C) 98.8 F (37.1 C) 99.1 F (37.3 C)  TempSrc: Oral Oral Oral Oral  SpO2: 96% 100% 100% 93%  Weight:      Height:       I changed his dressing. His left AKA looks good.   LABS:   Lab Results  Component Value Date   WBC 11.6 (H) 12/10/2020   HGB 7.7 (L) 12/10/2020   HCT 23.5 (L) 12/10/2020   MCV 74.6 (L) 12/10/2020   PLT 578 (H) 12/10/2020   Lab Results  Component Value Date   CREATININE 0.79 12/10/2020   Lab Results  Component Value Date   INR 1.1 12/07/2020    PROBLEM LIST:    Active Problems:   Wound cellulitis   Protein-calorie malnutrition, severe   CURRENT MEDS:    vitamin C  500 mg Oral Daily   atorvastatin  20 mg Oral Daily   clopidogrel  75 mg Oral Daily   docusate sodium  100 mg Oral Daily   feeding supplement  237 mL Oral BID BM   heparin  5,000 Units Subcutaneous Q8H   multivitamin with minerals  1 tablet Oral Daily   nicotine  21 mg Transdermal Daily   pantoprazole  40 mg Oral Daily   senna  1 tablet Oral BID   zinc sulfate  220 mg Oral Daily    12/09/2020 Office: [phone] 12/10/2020

## 2020-12-10 NOTE — Progress Notes (Signed)
Occupational Therapy Treatment Patient Details Name: Benjamin [name] MRN: [id number] DOB: 1960-08-08 Today's Date: 12/10/2020   History of present illness Pt is 60 y/o M admitted 12/07/20 with necrotic left above-knee amputation site. 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   OT comments  Pt presented in bed at this time and agreed to session. Pt required supervision to min guard for bed mobility with cues on how to transfer to EOB. Pt was able to complete sit to stand transfers to RW  with min guard. Pt was able to hop to chair with FW and min guard. Pt in session noted HR 116-120s and limited activity. Pt was educated about weight shifting to decrease in pain. Pt currently with functional limitations due to the deficits listed below (see OT Problem List).  Pt will benefit from skilled OT to increase their safety and independence with ADL and functional mobility for ADL to facilitate discharge to venue listed below.     Recommendations for follow up therapy are one component of a multi-disciplinary discharge planning process, led by the attending physician.  Recommendations may be updated based on patient status, additional functional criteria and insurance authorization.    Follow Up Recommendations  CIR    Equipment Recommendations  3 in 1 bedside commode    Recommendations for Other Services      Precautions / Restrictions Precautions Precautions: Fall Precaution Comments: monitor for pain on L AK amp, HR Restrictions Weight Bearing Restrictions: Yes LLE Weight Bearing: Non weight bearing       Mobility Bed Mobility Overal bed mobility: Needs Assistance Bed Mobility: Sit to Supine     Supine to sit: Supervision Sit to supine: Min guard   General bed mobility comments: min guard and moderate cues on positioning    Transfers Overall transfer  level: Needs assistance Equipment used: Rolling walker (2 wheeled) Transfers: Sit to/from Stand Sit to Stand: Min guard         General transfer comment: Pt required cue for sit to stand    Balance Overall balance assessment: Needs assistance Sitting-balance support: Single extremity supported Sitting balance-Leahy Scale: Good Sitting balance - Comments: Pt leans to decrease WB into LLE   Standing balance support: Bilateral upper extremity supported Standing balance-Leahy Scale: Poor                             ADL either performed or assessed with clinical judgement   ADL Overall ADL's : Needs assistance/impaired Eating/Feeding: Independent;Sitting   Grooming: Set up;Sitting   Upper Body Bathing: Independent;Sitting   Lower Body Bathing: Minimal assistance;Sitting/lateral leans   Upper Body Dressing : Set up;Sitting   Lower Body Dressing: Minimal assistance;Sitting/lateral leans;Sit to/from stand   Toilet Transfer: Min guard;Ambulation;BSC;RW   Toileting- Clothing Manipulation and Hygiene: Min guard;Cueing for safety;Cueing for sequencing;Sitting/lateral lean       Functional mobility during ADLs: Min guard;Rolling walker General ADL Comments: Pt limited by HR and pain     Vision   Vision Assessment?: No apparent visual deficits   Perception     Praxis      Cognition Arousal/Alertness: Awake/alert Behavior During Therapy: WFL for tasks assessed/performed Overall Cognitive Status: Within Functional Limits for tasks assessed  Exercises Other Exercises Other Exercises: reverse push ups from chair to increase in BUE strength for transfers   Shoulder Instructions       General Comments      Pertinent Vitals/ Pain       Pain Assessment: 0-10 Pain Score: 8  Pain Location: LLE Pain Descriptors / Indicators: Aching;Grimacing;Guarding Pain Intervention(s): Limited activity within  patient's tolerance;Monitored during session;Repositioned  Home Living                                          Prior Functioning/Environment              Frequency  Min 2X/week        Progress Toward Goals  OT Goals(current goals can now be found in the care plan section)  Progress towards OT goals: Progressing toward goals  Acute Rehab OT Goals Patient Stated Goal: return home OT Goal Formulation: With patient Time For Goal Achievement: 12/23/20 Potential to Achieve Goals: Good ADL Goals Pt Will Perform Grooming: with modified independence;standing Pt Will Perform Lower Body Bathing: with modified independence;sitting/lateral leans Pt Will Perform Lower Body Dressing: with modified independence;sitting/lateral leans;sit to/from stand Pt Will Transfer to Toilet: with modified independence;ambulating;bedside commode Pt Will Perform Tub/Shower Transfer: Tub transfer;with modified independence;ambulating;shower seat;rolling walker  Plan Discharge plan remains appropriate    Co-evaluation                 AM-PAC OT "6 Clicks" Daily Activity     Outcome Measure   Help from another person eating meals?: None Help from another person taking care of personal grooming?: A Little Help from another person toileting, which includes using toliet, bedpan, or urinal?: A Little Help from another person bathing (including washing, rinsing, drying)?: A Little Help from another person to put on and taking off regular upper body clothing?: A Little Help from another person to put on and taking off regular lower body clothing?: A Little 6 Click Score: 19    End of Session Equipment Utilized During Treatment: Gait belt;Rolling walker  OT Visit Diagnosis: Unsteadiness on feet (R26.81);Other abnormalities of gait and mobility (R26.89);Pain Pain - Right/Left: Left Pain - part of body: Leg   Activity Tolerance Patient limited by pain   Patient Left in  bed;with call bell/phone within reach;with chair alarm set   Nurse Communication   HR       Time: 1700-1749 OT Time Calculation (min): 28 min  Charges: OT General Charges $OT Visit: 1 Visit OT Treatments $Self Care/Home Management : 23-37 mins  Alphia Moh OTR/L  Acute Rehab Services  [phone] office number [phone] pager number   Alphia Moh 12/10/2020, 8:20 AM

## 2020-12-10 NOTE — Progress Notes (Signed)
IP rehab admissions - I met with patient at the bedside.  He would like to come to inpatient rehab prior to home with his sister.  I have called and opened the case with his insurance carrier.  Will await call back from insurance case manager.  Call me for questions.  [phone]

## 2020-12-10 NOTE — Progress Notes (Signed)
PROGRESS NOTE  [name]  DOB: [birth year]  PCP: Arnette Felts, FNP QIO:[id number]  DOA: 12/07/2020  LOS: 3 days  Hospital Day: 4   Chief Complaint  Patient presents with   Wound Infection   Brief narrative: Benjamin [name] is a 60 y.o. male with PMH significant for HTN, HLD, heavy smoking, peripheral artery disease s/p bilateral femoral artery bypass graft who underwent left above-knee amputation, removal of infected left femoral-popliteal bypass graft, placement of antibiotic beads by vascular surgery on 10/04/2020 and was discharged to home with p.o. Bactrim for 6 weeks, oxycodone for pain as well as wound VAC and home health services.   Patient presented to the ED from home today with significant drainage from the amputation stump with possible bone exposure.  He has the symptoms ongoing for last several days.  He was seen earlier in the month by vascular surgeon Dr. Randie Heinz with ongoing discussion about additional surgery, had amputation and further courses of antibiotics.  Patient was reluctant to come to the hospital despite multiple requests by family and home health RN.   In the ED, patient was afebrile, heart rate 120s, blood pressure 170/108, respiratory rate 22 on room air 99% O2 sat Labs with WBC 10.7, hemoglobin 9.3, platelets 621, BUN/creatinine 11/0.64, albumin 2.6, lactic acid 1.2 Left femur x-ray showed above-knee amputation status on the left without evidence of osteomyelitis but possible soft tissue infection. Vascular surgery consultation was obtained.  Hospitalist service consulted for inpatient admission and management. See below for details  Subjective: Patient was seen and examined this afternoon. Sitting up in chair.  Not in distress.  No new symptoms.  Dressing change earlier on the amputation stump. Overnight he was tachycardic and hypertensive.  Started on Coreg this morning.  Assessment/Plan: Infected left AKA amputation stump -Admitted for infected stump  site. -On 10/18, patient underwent revision of left above-knee amputation.  Purulent material was sent for culture.  Growing GPC and GNR -Currently on IV Zosyn. -Remains afebrile, WBC count up today probably because of stress from surgery. Recent Labs  Lab 12/07/20 1441 12/08/20 0040 12/09/20 0254 12/10/20 0226  WBC 10.7* 11.4* 13.0* 11.6*  LATICACIDVEN 1.2  --   --   --      Peripheral artery disease Hyperlipidemia -Continue Plavix, Lipitor -Tramadol for pain.   Essential hypertension -Tachycardic and hypertensive overnight.  Coreg low-dose started this morning.  If no control, will go up on the dose or add a second medicine.  Losartan remains on hold.   Heavy smoking -1 pack/day smoker.  Nicotine patch offered.  Counseled to quit.  Mobility: PT eval postprocedure Code Status:   Code Status: Full Code  Nutritional status: Body mass index is 21.93 kg/m. Nutrition Problem: Severe Malnutrition Etiology: chronic illness (PAD, Wound healing) Signs/Symptoms: severe fat depletion, severe muscle depletion Diet:  Diet Order             Diet regular Room service appropriate? Yes; Fluid consistency: Thin  Diet effective now                  DVT prophylaxis:  heparin injection 5,000 Units Start: 12/08/20 2200 SCD's Start: 12/08/20 1322   Antimicrobials: IV Zosyn Fluid: Stop fluid today. Consultants: Vascular surgery Family Communication: Not at bedside  Status is: Inpatient  Remains inpatient appropriate because: POD 1  Dispo: The patient is from: Home              Anticipated d/c is to: Pending PT eval  Patient currently is not medically stable to d/c.   Difficult to place patient No     Infusions:   magnesium sulfate bolus IVPB     piperacillin-tazobactam (ZOSYN)  IV 3.375 g (12/10/20 1420)    Scheduled Meds:  vitamin C  500 mg Oral Daily   atorvastatin  20 mg Oral Daily   carvedilol  3.125 mg Oral BID WC   clopidogrel  75 mg Oral Daily    docusate sodium  100 mg Oral Daily   feeding supplement  237 mL Oral BID BM   heparin  5,000 Units Subcutaneous Q8H   multivitamin with minerals  1 tablet Oral Daily   nicotine  21 mg Transdermal Daily   pantoprazole  40 mg Oral Daily   senna  1 tablet Oral BID   zinc sulfate  220 mg Oral Daily    Antimicrobials: Anti-infectives (From admission, onward)    Start     Dose/Rate Route Frequency Ordered Stop   12/07/20 1730  piperacillin-tazobactam (ZOSYN) IVPB 3.375 g        3.375 g 12.5 mL/hr over 240 Minutes Intravenous Every 8 hours 12/07/20 1721         PRN meds: acetaminophen **OR** acetaminophen, albuterol, bisacodyl, guaiFENesin-dextromethorphan, hydrALAZINE, magnesium sulfate bolus IVPB, methocarbamol, morphine injection, ondansetron, oxyCODONE-acetaminophen, phenol, potassium chloride, traMADol   Objective: Vitals:   12/10/20 0853 12/10/20 1604  BP: 135/86 127/87  Pulse: (!) 104 (!) 104  Resp: 16 17  Temp: 98.4 F (36.9 C) 98.2 F (36.8 C)  SpO2: 96% 95%    Intake/Output Summary (Last 24 hours) at 12/10/2020 1617 Last data filed at 12/10/2020 1400 Gross per 24 hour  Intake 1130.78 ml  Output 4355 ml  Net -3224.22 ml    Filed Weights   12/07/20 1415 12/08/20 0922  Weight: 68 kg 63.5 kg   Weight change:  Body mass index is 21.93 kg/m.   Physical Exam: General exam: Pleasant middle-aged African-American male.  Not in distress.  Pain controlled.   Skin: No rashes, lesions or ulcers. HEENT: Atraumatic, normocephalic, no obvious bleeding Lungs: Clear to auscultation bilaterally CVS: Regular rate and rhythm, no murmur GI/Abd soft, nontender, nondistended, bowel sound present CNS: Alert, awake, oriented x3 Psychiatry: Mood appropriate Extremities: Right leg with trace edema, left leg amputation stump with bandage on.  Data Review: I have personally reviewed the laboratory data and studies available.  Recent Labs  Lab 12/07/20 1441 12/08/20 0040  12/09/20 0254 12/10/20 0226  WBC 10.7* 11.4* 13.0* 11.6*  NEUTROABS 9.2*  --   --   --   HGB 9.3* 9.0* 7.4* 7.7*  HCT 27.6* 27.4* 21.7* 23.5*  MCV 75.4* 77.4* 74.3* 74.6*  PLT 621* 542* 502* 578*    Recent Labs  Lab 12/07/20 1441 12/08/20 0040 12/09/20 0254 12/10/20 0226  NA 138 135 126* 136  K 3.9 3.3* 3.7 3.9  CL 105 102 96* 99  CO2 25 25 23 28   GLUCOSE 108* 100* 109* 124*  BUN 11 10 8  7*  CREATININE 0.64 0.64 0.67 0.79  CALCIUM 8.5* 8.4* 7.9* 8.7*     F/u labs ordered Unresulted Labs (From admission, onward)     Start     Ordered   12/09/20 1456  Vitamin A  Once,   R        12/09/20 1510   12/09/20 1456  Vitamin C  Once,   R        12/09/20 1510   12/09/20 1456  Zinc  Once,   R        12/09/20 1510            Signed, Lorin Glass, MD [facility] 12/10/2020

## 2020-12-10 NOTE — PMR Pre-admission (Signed)
PMR Admission Coordinator Pre-Admission Assessment  Patient: Benjamin [name] is an 60 y.o., male MRN: [id number] DOB: [birth year] Height: 5' 7" (170.2 cm) Weight: 63.5 kg  Insurance Information HMO:     PPO:      PCP:      IPA:      80/20:      OTHER: [place] aged, blind, disabled adult copay PRIMARY: [place]      Policy#: [id number]      Subscriber: patient CM Name: Leanne Chang      Phone#: [phone]     Fax#: [phone] (from approval fax) Pre-Cert#: [id number] or [id number] Page for CIR from [place] with updates due to fax listed above on 10/31     Employer:  Benefits:  Phone #: [phone]     Name: Availity portal Eff. Date: 07/22/20     Deduct: $0      Out of Pocket Max: $0      Life Max: N/A CIR: 100%      SNF: 100% Outpatient: 27 visits     Co-Pay: $3 copay Home Health: 100%      Co-Pay: none DME: 100%     Co-Pay: none Providers: in network  SECONDARY:       Policy#:      Phone#:   Development worker, community:       Phone#:   The Engineer, petroleum" for patients in Inpatient Rehabilitation Facilities with attached "Privacy Act [place] Records" was provided and verbally reviewed with: N/A  Emergency Contact Information Contact Information     Name Relation Home Work Mobile   [place] Sister [phone]  [phone]   Rolene Arbour Niece   [phone]       Current Medical History  Patient Admitting Diagnosis: Revision L AKA  History of Present Illness: Pt is 60 y/o M admitted 12/07/20 with necrotic left above-knee amputation site. 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  PT/OT evaluations completed with recommendations for acute inpatient rehab admission.  Patient's medical record from [facility] has been reviewed by the rehabilitation admission coordinator and physician.  Past Medical History  Past  Medical History:  Diagnosis Date   Back pain    Heavy smoker    Hyperlipidemia LDL goal <70    Hypertension    Peripheral arterial occlusive disease ([place]) 06/2013   Bilateral femoral artery disease    Has the patient had major surgery during 100 days prior to admission? Yes  Family History   family history includes Arthritis in his mother; COPD in his father; Multiple sclerosis in his sister; Stroke in his father.  Current Medications  Current Facility-Administered Medications:    acetaminophen (TYLENOL) tablet 650 mg, 650 mg, Oral, Q6H PRN **OR** acetaminophen (TYLENOL) suppository 650 mg, 650 mg, Rectal, Q6H PRN, Rhyne, Samantha J, PA-C   albuterol (PROVENTIL) (2.5 MG/3ML) 0.083% nebulizer solution 2.5 mg, 2.5 mg, Nebulization, Q6H PRN, Dahal, Binaya, MD   ascorbic acid (VITAMIN C) tablet 500 mg, 500 mg, Oral, Daily, Dahal, Binaya, MD, 500 mg at 12/15/20 1012   atorvastatin (LIPITOR) tablet 20 mg, 20 mg, Oral, Daily, Rhyne, Samantha J, PA-C, 20 mg at 12/15/20 1012   bisacodyl (DULCOLAX) EC tablet 5 mg, 5 mg, Oral, Daily PRN, Rhyne, Samantha J, PA-C   carvedilol (COREG) tablet 6.25 mg, 6.25 mg, Oral, BID WC, Dahal, Binaya, MD, 6.25 mg at 12/15/20 [phone]  clopidogrel (PLAVIX) tablet 75 mg, 75 mg, Oral, Daily, Rhyne, Samantha J, PA-C, 75 mg at 12/15/20 1012   docusate sodium (COLACE) capsule 100 mg, 100 mg, Oral, Daily, Rhyne, Samantha J, PA-C, 100 mg at 12/15/20 1012   feeding supplement (ENSURE ENLIVE / ENSURE PLUS) liquid 237 mL, 237 mL, Oral, BID BM, Dahal, Binaya, MD, 237 mL at 12/15/20 1015   guaiFENesin-dextromethorphan (ROBITUSSIN DM) 100-10 MG/5ML syrup 15 mL, 15 mL, Oral, Q4H PRN, Rhyne, Samantha J, PA-C   heparin injection 5,000 Units, 5,000 Units, Subcutaneous, Q8H, Rhyne, Samantha J, PA-C, 5,000 Units at 12/15/20 0534   hydrALAZINE (APRESOLINE) injection 10 mg, 10 mg, Intravenous, Q6H PRN, Rhyne, Samantha J, PA-C   hydrOXYzine (ATARAX/VISTARIL) tablet 25 mg, 25 mg, Oral, QHS  PRN, Dahal, Marlowe Aschoff, MD, 25 mg at 12/14/20 2136   magnesium sulfate IVPB 2 g 50 mL, 2 g, Intravenous, Daily PRN, Rhyne, Samantha J, PA-C   methocarbamol (ROBAXIN) tablet 500 mg, 500 mg, Oral, Q6H PRN, Dahal, Binaya, MD, 500 mg at 12/15/20 1013   morphine 2 MG/ML injection 2 mg, 2 mg, Intravenous, Q2H PRN, Rhyne, Samantha J, PA-C, 2 mg at 12/15/20 0605   multivitamin with minerals tablet 1 tablet, 1 tablet, Oral, Daily, Dahal, Binaya, MD, 1 tablet at 12/15/20 1015   nicotine (NICODERM CQ - dosed in mg/24 hours) patch 21 mg, 21 mg, Transdermal, Daily, Rhyne, Samantha J, PA-C, 21 mg at 12/15/20 1016   ondansetron (ZOFRAN) injection 4 mg, 4 mg, Intravenous, Q6H PRN, Rhyne, Samantha J, PA-C   oxyCODONE-acetaminophen (PERCOCET/ROXICET) 5-325 MG per tablet 1-2 tablet, 1-2 tablet, Oral, Q4H PRN, Rhyne, Samantha J, PA-C, 2 tablet at 12/15/20 0535   pantoprazole (PROTONIX) EC tablet 40 mg, 40 mg, Oral, Daily, Rhyne, Samantha J, PA-C, 40 mg at 12/15/20 1012   phenol (CHLORASEPTIC) mouth spray 1 spray, 1 spray, Mouth/Throat, PRN, Rhyne, Samantha J, PA-C   potassium chloride SA (KLOR-CON) CR tablet 20-40 mEq, 20-40 mEq, Oral, Daily PRN, Rhyne, Samantha J, PA-C   senna (SENOKOT) tablet 8.6 mg, 1 tablet, Oral, BID, Rhyne, Samantha J, PA-C, 8.6 mg at 12/15/20 1012   traMADol (ULTRAM) tablet 50 mg, 50 mg, Oral, Q12H PRN, Rhyne, Samantha J, PA-C, 50 mg at 12/15/20 1012   zinc sulfate capsule 220 mg, 220 mg, Oral, Daily, Dahal, Binaya, MD, 220 mg at 12/15/20 1012  Patients Current Diet:  Diet Order             Diet regular Room service appropriate? Yes; Fluid consistency: Thin  Diet effective now                   Precautions / Restrictions Precautions Precautions: Fall Precaution Comments: monitor for pain on L AK amp, HR Restrictions Weight Bearing Restrictions: Yes LLE Weight Bearing: Non weight bearing   Has the patient had 2 or more falls or a fall with injury in the past year? Yes  Prior  Activity Level Community (5-7x/wk): Went out about 4 X a week, worked PT  Prior Functional Level Self Care: Did the patient need help bathing, dressing, using the toilet or eating? Independent  Indoor Mobility: Did the patient need assistance with walking from room to room (with or without device)? Independent  Stairs: Did the patient need assistance with internal or external stairs (with or without device)? Independent  Functional Cognition: Did the patient need help planning regular tasks such as shopping or remembering to take medications? Independent  Patient Information Are you of Hispanic, Latino/a,or Spanish origin?: A.  No, not of Hispanic, Latino/a, or Spanish origin What is your race?: B. Black or African American Do you need or want an interpreter to communicate with a doctor or health care staff?: 0. No  Patient's Response To:  Health Literacy and Transportation Is the patient able to respond to health literacy and transportation needs?: Yes Health Literacy - How often do you need to have someone help you when you read instructions, pamphlets, or other written material from your doctor or pharmacy?: Rarely In the past 12 months, has lack of transportation kept you from medical appointments or from getting medications?: No In the past 12 months, has lack of transportation kept you from meetings, work, or from getting things needed for daily living?: No  Development worker, international aid / [place]: Environmental consultant - 2 wheels, Shower seat, Crutches, Wheelchair - manual, Grab bars - tub/shower  Prior Device Use: Indicate devices/aids used by the patient prior to current illness, exacerbation or injury? Manual wheelchair and Walker  Current Functional Level Cognition  Overall Cognitive Status: Within Functional Limits for tasks assessed Orientation Level: Oriented X4    Extremity Assessment (includes Sensation/Coordination)  Upper Extremity Assessment: Overall WFL for tasks  assessed  Lower Extremity Assessment: Defer to PT evaluation RLE Deficits / Details: minor weakness in hip    ADLs  Overall ADL's : Needs assistance/impaired Eating/Feeding: Independent, Sitting Grooming: Supervision/safety, Wash/dry hands, Wash/dry face, Standing Upper Body Bathing: Independent, Sitting Lower Body Bathing: Minimal assistance, Sitting/lateral leans Upper Body Dressing : Set up, Sitting Lower Body Dressing: Minimal assistance, Sitting/lateral leans, Sit to/from stand Toilet Transfer: Min guard, Ambulation, BSC, RW Toileting- Clothing Manipulation and Hygiene: Min guard, Cueing for safety, Cueing for sequencing, Sitting/lateral lean Functional mobility during ADLs: Min guard, Rolling walker General ADL Comments: Pt limited by pain and balance    Mobility  Overal bed mobility: Needs Assistance Bed Mobility: Supine to Sit Supine to sit: Supervision Sit to supine: Min guard General bed mobility comments: Up in chair upon PT Arrival.    Transfers  Overall transfer level: Needs assistance Equipment used: Rolling walker (2 wheeled) Transfers: Sit to/from Stand Sit to Stand: Min guard General transfer comment: Min A to power to standing from low chair with cues for hand placement; pt grunts during transition due to pain.    Ambulation / Gait / Stairs / Wheelchair Mobility  Ambulation/Gait Ambulation/Gait assistance: Counsellor (Feet): 100 Feet Assistive device: Rolling walker (2 wheeled) Gait Pattern/deviations:  ("hop to") General Gait Details: SLow, steady gait with heavy reliance on UEs on RW; a few instances of sharp shooting pains through left residual limb. Cues to extend LLE in standing. Gait velocity: decreased    Posture / Balance Dynamic Sitting Balance Sitting balance - Comments: Pt leans to decrease WB into LLE Balance Overall balance assessment: Needs assistance Sitting-balance support: Feet supported, No upper extremity supported Sitting  balance-Leahy Scale: Good Sitting balance - Comments: Pt leans to decrease WB into LLE Standing balance support: During functional activity Standing balance-Leahy Scale: Poor Standing balance comment: Requires Ue support in standing    Special needs/care consideration Skin Left AKA incisional site revision with dressing and ace wrap   Previous Home Environment (from acute therapy documentation) Living Arrangements: Other relatives, Other (Comment) Available Help at Discharge: Family, Available 24 hours/day Type of Home: House Home Layout: One level Home Access: Stairs to enter Entrance Stairs-Rails: Left, Right, Can reach both Entrance Stairs-Number of Steps: 4 Bathroom Shower/Tub: Chiropodist: Standard  Discharge Living Setting Plans for Discharge Living Setting: House, Lives with (comment) (Lives with sister and her family.) Type of Home at Discharge: House Discharge Home Layout: One level Discharge Home Access: Ramped entrance Discharge Bathroom Shower/Tub: Tub/shower unit, Curtain Discharge Bathroom Toilet: Standard Discharge Bathroom Accessibility: Yes How Accessible: Accessible via walker (Can get in sideways with a walker)  Social/Family/Support Systems Patient Roles: Other (Comment) (Has sister and her family) Contact Information: Garth Bigness - sister Anticipated Caregiver: Sister Anticipated Caregiver's Contact Information: Iban Utz - sister - [phone] Ability/Limitations of Caregiver: Sister his MS and uses a walker, can provide supervision Caregiver Availability: 24/7 Discharge Plan Discussed with Primary Caregiver: Yes Is Caregiver In Agreement with Plan?: Yes Does Caregiver/Family have Issues with Lodging/Transportation while Pt is in Rehab?: No  Goals Patient/Family Goal for Rehab: PT/OT supervision goals Expected length of stay: 10-14 days Cultural Considerations: None Pt/Family Agrees to Admission and willing to participate:  Yes Program Orientation Provided & Reviewed with Pt/Caregiver Including Roles  & Responsibilities: Yes  Decrease burden of Care through IP rehab admission: N/A  Possible need for SNF placement upon discharge: Not anticipated  Patient Condition: I have reviewed medical records from [facility], spoken with CM, and patient. I met with patient at the bedside for inpatient rehabilitation assessment.  Patient will benefit from ongoing PT and OT, can actively participate in 3 hours of therapy a day 5 days of the week, and can make measurable gains during the admission.  Patient will also benefit from the coordinated team approach during an Inpatient Acute Rehabilitation admission.  The patient will receive intensive therapy as well as Rehabilitation physician, nursing, social worker, and care management interventions.  Due to bladder management, bowel management, safety, skin/wound care, disease management, medication administration, pain management, and patient education the patient requires 24 hour a day rehabilitation nursing.  The patient is currently min to minguard assist with mobility and basic ADLs.  Discharge setting and therapy post discharge at home with home health is anticipated.  Patient has agreed to participate in the Acute Inpatient Rehabilitation Program and will admit today.  Preadmission Screen Completed By:  Retta Diones, 12/15/2020 10:48 AM ______________________________________________________________________   Discussed status with Dr. Dagoberto Ligas on 12/15/20  at 10:48 AM  and received approval for admission today.  Admission Coordinator:  Michel Santee, PT, time 10:48 AM Sudie Grumbling 12/15/20    Assessment/Plan: Diagnosis: Does the need for close, 24 hr/day Medical supervision in concert with the patient's rehab needs make it unreasonable for this patient to be served in a less intensive setting? Yes Co-Morbidities requiring supervision/potential complications: L AKA revision due to  necrosis; heavy smoker; HTN, PAD, s/p B/L femoral bypasses; HLD,  Due to bladder management, bowel management, safety, skin/wound care, disease management, medication administration, pain management, and patient education, does the patient require 24 hr/day rehab nursing? Yes Does the patient require coordinated care of a physician, rehab nurse, PT, OT, and SLP to address physical and functional deficits in the context of the above medical diagnosis(es)? Yes Addressing deficits in the following areas: balance, endurance, locomotion, strength, transferring, bowel/bladder control, bathing, dressing, feeding, grooming, and toileting Can the patient actively participate in an intensive therapy program of at least 3 hrs of therapy 5 days a week? Yes The potential for patient to make measurable gains while on inpatient rehab is good Anticipated functional outcomes upon discharge from inpatient rehab: supervision PT, supervision OT, n/a SLP Estimated rehab length of stay to reach the above functional goals  is: 10-14 days Anticipated discharge destination: Home 10. Overall Rehab/Functional Prognosis: good   MD Signature:

## 2020-12-11 DIAGNOSIS — L0390 Cellulitis, unspecified: Secondary | ICD-10-CM | POA: Diagnosis not present

## 2020-12-11 LAB — CBC WITH DIFFERENTIAL/PLATELET
Abs Immature Granulocytes: 0.03 10*3/uL (ref 0.00–0.07)
Basophils Absolute: 0 10*3/uL (ref 0.0–0.1)
Basophils Relative: 0 %
Eosinophils Absolute: 0.1 10*3/uL (ref 0.0–0.5)
Eosinophils Relative: 1 %
HCT: 22.9 % — ABNORMAL LOW (ref 39.0–52.0)
Hemoglobin: 7.6 g/dL — ABNORMAL LOW (ref 13.0–17.0)
Immature Granulocytes: 0 %
Lymphocytes Relative: 18 %
Lymphs Abs: 1.6 10*3/uL (ref 0.7–4.0)
MCH: 24.9 pg — ABNORMAL LOW (ref 26.0–34.0)
MCHC: 33.2 g/dL (ref 30.0–36.0)
MCV: 75.1 fL — ABNORMAL LOW (ref 80.0–100.0)
Monocytes Absolute: 0.5 10*3/uL (ref 0.1–1.0)
Monocytes Relative: 6 %
Neutro Abs: 6.5 10*3/uL (ref 1.7–7.7)
Neutrophils Relative %: 75 %
Platelets: 565 10*3/uL — ABNORMAL HIGH (ref 150–400)
RBC: 3.05 MIL/uL — ABNORMAL LOW (ref 4.22–5.81)
RDW: 21.3 % — ABNORMAL HIGH (ref 11.5–15.5)
WBC: 8.7 10*3/uL (ref 4.0–10.5)
nRBC: 0 % (ref 0.0–0.2)

## 2020-12-11 LAB — BASIC METABOLIC PANEL
Anion gap: 8 (ref 5–15)
BUN: 8 mg/dL (ref 8–23)
CO2: 27 mmol/L (ref 22–32)
Calcium: 8.4 mg/dL — ABNORMAL LOW (ref 8.9–10.3)
Chloride: 98 mmol/L (ref 98–111)
Creatinine, Ser: 0.71 mg/dL (ref 0.61–1.24)
GFR, Estimated: >60 mL/min (ref >60)
Glucose, Bld: 150 mg/dL — ABNORMAL HIGH (ref 70–99)
Potassium: 4 mmol/L (ref 3.5–5.1)
Sodium: 133 mmol/L — ABNORMAL LOW (ref 135–145)

## 2020-12-11 MED ORDER — HYDROXYZINE HCL 25 MG PO TABS
25.0000 mg | ORAL_TABLET | Freq: Every evening | ORAL | Status: DC | PRN
  Administered 2020-12-11 – 2020-12-14 (×4): 25 mg via ORAL
  Filled 2020-12-11 (×4): qty 1

## 2020-12-11 NOTE — Progress Notes (Addendum)
   VASCULAR SURGERY ASSESSMENT & PLAN:   POD 3 LEFT AKA: His AKA is healing nicely. I have written for daily dressing changes.    ID: He had purulent drainage in the proximal thigh. Intraoperative culture shows moderate Enterobacter and few Klebsiella he is on IV Zosyn.  Both of these bacteria are sensitive to Zosyn Rocephin was stopped yesterday. Would continue IV ABx for another 24 hours.    DVT PROPHYLAXIS: He is on SQ heparin.    It looks like he will be going to inpatient rehab soon.  Vascular surgery will be available as needed.  I have arranged follow-up in 1 month for staple removal.    SUBJECTIVE:   No complaints.  PHYSICAL EXAM:   Vitals:   12/10/20 1604 12/10/20 1940 12/11/20 0542 12/11/20 0740  BP: 127/87 133/88 (!) 123/95 (!) 144/89  Pulse: (!) 104 86 95 (!) 102  Resp: 17 18  18   Temp: 98.2 F (36.8 C) (!) 97.5 F (36.4 C) 98.1 F (36.7 C) 98.2 F (36.8 C)  TempSrc: Oral Oral Oral Oral  SpO2: 95% 100% 97% 96%  Weight:      Height:       His a left above-the-knee amputation site is healing nicely.  LABS:   Lab Results  Component Value Date   WBC 8.7 12/11/2020   HGB 7.6 (L) 12/11/2020   HCT 22.9 (L) 12/11/2020   MCV 75.1 (L) 12/11/2020   PLT 565 (H) 12/11/2020   Lab Results  Component Value Date   CREATININE 0.71 12/11/2020   Lab Results  Component Value Date   INR 1.1 12/07/2020    PROBLEM LIST:    Active Problems:   Wound cellulitis   Protein-calorie malnutrition, severe   CURRENT MEDS:    vitamin C  500 mg Oral Daily   atorvastatin  20 mg Oral Daily   carvedilol  3.125 mg Oral BID WC   clopidogrel  75 mg Oral Daily   docusate sodium  100 mg Oral Daily   feeding supplement  237 mL Oral BID BM   heparin  5,000 Units Subcutaneous Q8H   multivitamin with minerals  1 tablet Oral Daily   nicotine  21 mg Transdermal Daily   pantoprazole  40 mg Oral Daily   senna  1 tablet Oral BID   zinc sulfate  220 mg Oral Daily    12/09/2020 Office: [phone] 12/11/2020

## 2020-12-11 NOTE — Progress Notes (Signed)
Physical Therapy Treatment Patient Details Name: Benjamin [name] MRN: [id number] DOB: 1960/09/02 Today's Date: 12/11/2020   History of Present Illness Pt is 60 y/o M admitted 12/07/20 with necrotic left above-knee amputation site. 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    PT Comments    Patient progressing well towards PT goals. Reports continued pain in left residual limb that is sharp at times and worse with certain changes in position. Improved ambulation distance with chair follow and Min guard assist for safety. Fatigue and pain are biggest limiting factors. Difficulty moving left residual limb in any direction due to pain with tendency for hip flexion in standing despite cues. Highly motivated to maximize his independence. Continues to be appropriate for CIR. Will follow.   Recommendations for follow up therapy are one component of a multi-disciplinary discharge planning process, led by the attending physician.  Recommendations may be updated based on patient status, additional functional criteria and insurance authorization.  Follow Up Recommendations  CIR     Equipment Recommendations  None recommended by PT    Recommendations for Other Services       Precautions / Restrictions Precautions Precautions: Fall Restrictions Weight Bearing Restrictions: Yes LLE Weight Bearing: Non weight bearing     Mobility  Bed Mobility               General bed mobility comments: Up in chair upon PT Arrival.    Transfers Overall transfer level: Needs assistance Equipment used: Rolling walker (2 wheeled) Transfers: Sit to/from Stand Sit to Stand: Min assist         General transfer comment: Min A to power to standing from low chair with cues for hand placement; pt grunts during transition due to pain.  Ambulation/Gait Ambulation/Gait assistance:  Min guard Gait Distance (Feet): 100 Feet Assistive device: Rolling walker (2 wheeled) Gait Pattern/deviations:  ("hop to") Gait velocity: decreased   General Gait Details: SLow, steady gait with heavy reliance on UEs on RW; a few instances of sharp shooting pains through left residual limb. Cues to extend LLE in standing.   Stairs             Wheelchair Mobility    Modified Rankin (Stroke Patients Only)       Balance Overall balance assessment: Needs assistance Sitting-balance support: Feet supported;No upper extremity supported (foot supported) Sitting balance-Leahy Scale: Good     Standing balance support: During functional activity Standing balance-Leahy Scale: Poor Standing balance comment: Requires Ue support in standing                            Cognition Arousal/Alertness: Awake/alert Behavior During Therapy: WFL for tasks assessed/performed Overall Cognitive Status: Within Functional Limits for tasks assessed                                        Exercises      General Comments General comments (skin integrity, edema, etc.): Encouraged AROM of left residual limb but difficulty mobilizing it this morning due to pain/weakness.      Pertinent Vitals/Pain Pain Assessment: Faces Faces Pain Scale: Hurts even more Pain Location: left residual limb Pain Descriptors / Indicators: Sharp;Aching;Guarding;Grimacing Pain Intervention(s): Monitored during session;Premedicated before session;Repositioned  Home Living                      Prior Function            PT Goals (current goals can now be found in the care plan section) Progress towards PT goals: Progressing toward goals    Frequency    Min 3X/week      PT Plan Current plan remains appropriate    Co-evaluation              AM-PAC PT "6 Clicks" Mobility   Outcome Measure  Help needed turning from your back to your side while in a flat bed  without using bedrails?: A Little Help needed moving from lying on your back to sitting on the side of a flat bed without using bedrails?: A Little Help needed moving to and from a bed to a chair (including a wheelchair)?: A Little Help needed standing up from a chair using your arms (e.g., wheelchair or bedside chair)?: A Little Help needed to walk in hospital room?: A Little Help needed climbing 3-5 steps with a railing? : A Lot 6 Click Score: 17    End of Session Equipment Utilized During Treatment: Gait belt Activity Tolerance: Patient tolerated treatment well;Patient limited by pain Patient left: in chair;with call bell/phone within reach;with chair alarm set Nurse Communication: Mobility status PT Visit Diagnosis: Unsteadiness on feet (R26.81);Muscle weakness (generalized) (M62.81);Difficulty in walking, not elsewhere classified (R26.2);Pain Pain - Right/Left: Left Pain - part of body: Leg     Time: 7628-3151 PT Time Calculation (min) (ACUTE ONLY): 22 min  Charges:  $Gait Training: 8-22 mins                     Benjamin [name], PT, DPT Acute Rehabilitation Services Pager [phone] Office [phone]      Blake Divine A Lanier Ensign 12/11/2020, 1:29 PM

## 2020-12-11 NOTE — Progress Notes (Signed)
PROGRESS NOTE  Benjamin [name]  DOB: [birth year]  PCP: Arnette Felts, FNP QMV:[id number]  DOA: 12/07/2020  LOS: 4 days  Hospital Day: 5   Chief Complaint  Patient presents with   Wound Infection   Brief narrative: Benjamin [name] is a 60 y.o. male with PMH significant for HTN, HLD, heavy smoking, peripheral artery disease s/p bilateral femoral artery bypass graft who underwent left above-knee amputation, removal of infected left femoral-popliteal bypass graft, placement of antibiotic beads by vascular surgery on 10/04/2020 and was discharged to home with p.o. Bactrim for 6 weeks, oxycodone for pain as well as wound VAC and home health services.   Patient presented to the ED from home today with significant drainage from the amputation stump with possible bone exposure.  He has the symptoms ongoing for last several days.  He was seen earlier in the month by vascular surgeon Dr. Randie Heinz with ongoing discussion about additional surgery, had amputation and further courses of antibiotics.  Patient was reluctant to come to the hospital despite multiple requests by family and home health RN.   In the ED, patient was afebrile, heart rate 120s, blood pressure 170/108, respiratory rate 22 on room air 99% O2 sat Labs with WBC 10.7, hemoglobin 9.3, platelets 621, BUN/creatinine 11/0.64, albumin 2.6, lactic acid 1.2 Left femur x-ray showed above-knee amputation status on the left without evidence of osteomyelitis but possible soft tissue infection. Vascular surgery consultation was obtained.  Hospitalist service consulted for inpatient admission and management. See below for details  Subjective: Patient was seen and examined this afternoon. Walking on the hallway with PT.  Pain controlled. Heart rate and blood pressure improving with Coreg.  Assessment/Plan: Infected left AKA amputation stump -Admitted for infected stump site. -On 10/18, patient underwent revision of left above-knee amputation.  Purulent  material was sent for culture.  Growing Enterobacter and MDR Klebsiella, but sensitive to IV Zosyn which patient is currently getting. -Remains afebrile.  WBC count normal. Recent Labs  Lab 12/07/20 1441 12/08/20 0040 12/09/20 0254 12/10/20 0226 12/11/20 0156  WBC 10.7* 11.4* 13.0* 11.6* 8.7  LATICACIDVEN 1.2  --   --   --   --      Peripheral artery disease Hyperlipidemia -Continue Plavix, Lipitor -Tramadol for pain.   Essential hypertension -On 10/20, because of tachycardia and hypertension, Coreg 3.125 mg was started.  Losartan remains on hold.     Heavy smoking -1 pack/day smoker.  Nicotine patch offered.  Counseled to quit.  Mobility: PT eval appreciated Code Status:   Code Status: Full Code  Nutritional status: Body mass index is 21.93 kg/m. Nutrition Problem: Severe Malnutrition Etiology: chronic illness (PAD, Wound healing) Signs/Symptoms: severe fat depletion, severe muscle depletion Diet:  Diet Order             Diet regular Room service appropriate? Yes; Fluid consistency: Thin  Diet effective now                  DVT prophylaxis:  heparin injection 5,000 Units Start: 12/08/20 2200 SCD's Start: 12/08/20 1322   Antimicrobials: IV Zosyn Fluid: Off IV fluid Consultants: Vascular surgery Family Communication: Not at bedside  Status is: Inpatient  Remains inpatient appropriate because: POD 1  Dispo: The patient is from: Home              Anticipated d/c is to: PT eval appreciated.  CIR recommended.              Patient currently is not  medically stable to d/c.   Difficult to place patient No     Infusions:   magnesium sulfate bolus IVPB     piperacillin-tazobactam (ZOSYN)  IV 3.375 g (12/11/20 1307)    Scheduled Meds:  vitamin C  500 mg Oral Daily   atorvastatin  20 mg Oral Daily   carvedilol  3.125 mg Oral BID WC   clopidogrel  75 mg Oral Daily   docusate sodium  100 mg Oral Daily   feeding supplement  237 mL Oral BID BM   heparin   5,000 Units Subcutaneous Q8H   multivitamin with minerals  1 tablet Oral Daily   nicotine  21 mg Transdermal Daily   pantoprazole  40 mg Oral Daily   senna  1 tablet Oral BID   zinc sulfate  220 mg Oral Daily    Antimicrobials: Anti-infectives (From admission, onward)    Start     Dose/Rate Route Frequency Ordered Stop   12/07/20 1730  piperacillin-tazobactam (ZOSYN) IVPB 3.375 g        3.375 g 12.5 mL/hr over 240 Minutes Intravenous Every 8 hours 12/07/20 1721         PRN meds: acetaminophen **OR** acetaminophen, albuterol, bisacodyl, guaiFENesin-dextromethorphan, hydrALAZINE, magnesium sulfate bolus IVPB, methocarbamol, morphine injection, ondansetron, oxyCODONE-acetaminophen, phenol, potassium chloride, traMADol   Objective: Vitals:   12/11/20 0542 12/11/20 0740  BP: (!) 123/95 (!) 144/89  Pulse: 95 (!) 102  Resp:  18  Temp: 98.1 F (36.7 C) 98.2 F (36.8 C)  SpO2: 97% 96%    Intake/Output Summary (Last 24 hours) at 12/11/2020 1316 Last data filed at 12/11/2020 0900 Gross per 24 hour  Intake 930 ml  Output 2350 ml  Net -1420 ml    Filed Weights   12/07/20 1415 12/08/20 0922  Weight: 68 kg 63.5 kg   Weight change:  Body mass index is 21.93 kg/m.   Physical Exam: General exam: Pleasant middle-aged African-American male.  Not in distress.  Pain controlled  skin: No rashes, lesions or ulcers. HEENT: Atraumatic, normocephalic, no obvious bleeding Lungs: Clear to auscultation bilaterally CVS: Regular rate and rhythm, no murmur GI/Abd soft, nontender, nondistended, bowel sound present CNS: Alert, awake, oriented x3 Psychiatry: Mood appropriate Extremities: Right leg with trace edema, left leg amputation stump with bandage on.  Data Review: I have personally reviewed the laboratory data and studies available.  Recent Labs  Lab 12/07/20 1441 12/08/20 0040 12/09/20 0254 12/10/20 0226 12/11/20 0156  WBC 10.7* 11.4* 13.0* 11.6* 8.7  NEUTROABS 9.2*  --    --   --  6.5  HGB 9.3* 9.0* 7.4* 7.7* 7.6*  HCT 27.6* 27.4* 21.7* 23.5* 22.9*  MCV 75.4* 77.4* 74.3* 74.6* 75.1*  PLT 621* 542* 502* 578* 565*    Recent Labs  Lab 12/07/20 1441 12/08/20 0040 12/09/20 0254 12/10/20 0226 12/11/20 0156  NA 138 135 126* 136 133*  K 3.9 3.3* 3.7 3.9 4.0  CL 105 102 96* 99 98  CO2 25 25 23 28 27   GLUCOSE 108* 100* 109* 124* 150*  BUN 11 10 8  7* 8  CREATININE 0.64 0.64 0.67 0.79 0.71  CALCIUM 8.5* 8.4* 7.9* 8.7* 8.4*     F/u labs ordered Unresulted Labs (From admission, onward)     Start     Ordered   12/11/20 0500  CBC with Differential/Platelet  Daily,   R      12/10/20 1620   12/11/20 0500  Basic metabolic panel  Daily,   R  12/10/20 1620   12/09/20 1456  Vitamin A  Once,   R        12/09/20 1510   12/09/20 1456  Vitamin C  Once,   R        12/09/20 1510   12/09/20 1456  Zinc  Once,   R        12/09/20 1510            Signed, Lorin Glass, MD [facility] 12/11/2020

## 2020-12-11 NOTE — Progress Notes (Signed)
Wound care completed with patient help. Scan amount of oozing from incision site and dried blood. Area cleansed and re-dressed per order with dry 4x4, Kerlix, and ace bandage. Patient educated on the importance of keeping the dressing clean, dry, and intact. Patient verbalizes understanding.

## 2020-12-12 DIAGNOSIS — L0390 Cellulitis, unspecified: Secondary | ICD-10-CM | POA: Diagnosis not present

## 2020-12-12 LAB — BASIC METABOLIC PANEL
Anion gap: 9 (ref 5–15)
BUN: 10 mg/dL (ref 8–23)
CO2: 24 mmol/L (ref 22–32)
Calcium: 8.3 mg/dL — ABNORMAL LOW (ref 8.9–10.3)
Chloride: 101 mmol/L (ref 98–111)
Creatinine, Ser: 0.71 mg/dL (ref 0.61–1.24)
GFR, Estimated: >60 mL/min (ref >60)
Glucose, Bld: 107 mg/dL — ABNORMAL HIGH (ref 70–99)
Potassium: 3.9 mmol/L (ref 3.5–5.1)
Sodium: 134 mmol/L — ABNORMAL LOW (ref 135–145)

## 2020-12-12 LAB — CBC WITH DIFFERENTIAL/PLATELET
Abs Immature Granulocytes: 0 10*3/uL (ref 0.00–0.07)
Basophils Absolute: 0 10*3/uL (ref 0.0–0.1)
Basophils Relative: 0 %
Eosinophils Absolute: 0 10*3/uL (ref 0.0–0.5)
Eosinophils Relative: 0 %
HCT: 22.4 % — ABNORMAL LOW (ref 39.0–52.0)
Hemoglobin: 7.3 g/dL — ABNORMAL LOW (ref 13.0–17.0)
Lymphocytes Relative: 12 %
Lymphs Abs: 0.9 10*3/uL (ref 0.7–4.0)
MCH: 24.4 pg — ABNORMAL LOW (ref 26.0–34.0)
MCHC: 32.6 g/dL (ref 30.0–36.0)
MCV: 74.9 fL — ABNORMAL LOW (ref 80.0–100.0)
Monocytes Absolute: 0.4 10*3/uL (ref 0.1–1.0)
Monocytes Relative: 5 %
Neutro Abs: 6.2 10*3/uL (ref 1.7–7.7)
Neutrophils Relative %: 83 %
Platelets: 571 10*3/uL — ABNORMAL HIGH (ref 150–400)
RBC: 2.99 MIL/uL — ABNORMAL LOW (ref 4.22–5.81)
RDW: 21.1 % — ABNORMAL HIGH (ref 11.5–15.5)
WBC: 7.5 10*3/uL (ref 4.0–10.5)
nRBC: 0 % (ref 0.0–0.2)
nRBC: 0 /100 WBC

## 2020-12-12 LAB — CULTURE, BLOOD (ROUTINE X 2)
Culture: NO GROWTH
Culture: NO GROWTH
Special Requests: ADEQUATE
Special Requests: ADEQUATE

## 2020-12-12 LAB — ZINC: Zinc: 46 ug/dL (ref 44–115)

## 2020-12-12 MED ORDER — CARVEDILOL 6.25 MG PO TABS
6.2500 mg | ORAL_TABLET | Freq: Two times a day (BID) | ORAL | Status: DC
  Administered 2020-12-12 – 2020-12-15 (×7): 6.25 mg via ORAL
  Filled 2020-12-12 (×7): qty 1

## 2020-12-12 MED ORDER — CARVEDILOL 6.25 MG PO TABS: 6.2500 mg | ORAL_TABLET | Freq: Two times a day (BID) | ORAL | Status: DC

## 2020-12-12 NOTE — Progress Notes (Signed)
PROGRESS NOTE  Benjamin [name]  DOB: [birth year]  PCP: Arnette Felts, FNP ZOX:[id number]  DOA: 12/07/2020  LOS: 5 days  Hospital Day: 6   Chief Complaint  Patient presents with   Wound Infection   Brief narrative: Benjamin [name] is a 60 y.o. male with PMH significant for HTN, HLD, heavy smoking, peripheral artery disease s/p bilateral femoral artery bypass graft who underwent left above-knee amputation, removal of infected left femoral-popliteal bypass graft, placement of antibiotic beads by vascular surgery on 10/04/2020 and was discharged to home with p.o. Bactrim for 6 weeks, oxycodone for pain as well as wound VAC and home health services.   Patient presented to the ED from home today with significant drainage from the amputation stump with possible bone exposure.  He has the symptoms ongoing for last several days.  He was seen earlier in the month by vascular surgeon Dr. Randie Heinz with ongoing discussion about additional surgery, had amputation and further courses of antibiotics.  Patient was reluctant to come to the hospital despite multiple requests by family and home health RN.   In the ED, patient was afebrile, heart rate 120s, blood pressure 170/108, respiratory rate 22 on room air 99% O2 sat Labs with WBC 10.7, hemoglobin 9.3, platelets 621, BUN/creatinine 11/0.64, albumin 2.6, lactic acid 1.2 Left femur x-ray showed above-knee amputation status on the left without evidence of osteomyelitis but possible soft tissue infection. Vascular surgery consultation was obtained.  Hospitalist service consulted for inpatient admission and management. See below for details  Subjective: Patient was seen and examined this morning.  Sitting up in bed.  Not in distress.  No new symptoms.  Tachycardia present elevated again this morning.  Coreg dose increased.  Assessment/Plan: Infected left AKA amputation stump -Admitted for infected stump site. -On 10/18, patient underwent revision of left  above-knee amputation.  Purulent material was sent for culture.  Growing Enterobacter and MDR Klebsiella, but sensitive to IV Zosyn which patient is currently getting. -Remains afebrile.  WBC count normal. Recent Labs  Lab 12/07/20 1441 12/08/20 0040 12/09/20 0254 12/10/20 0226 12/11/20 0156 12/12/20 0103  WBC 10.7* 11.4* 13.0* 11.6* 8.7 7.5  LATICACIDVEN 1.2  --   --   --   --   --      Peripheral artery disease Hyperlipidemia -Continue Plavix, Lipitor -Tramadol for pain.   Essential hypertension -On 10/20, because of tachycardia and hypertension, I increased the dose of Coreg to 6.25 mg today.  Losartan remains on hold.   Heavy smoking -1 pack/day smoker.  Nicotine patch offered.  Counseled to quit.  Mobility: PT eval appreciated Code Status:   Code Status: Full Code  Nutritional status: Body mass index is 21.93 kg/m. Nutrition Problem: Severe Malnutrition Etiology: chronic illness (PAD, Wound healing) Signs/Symptoms: severe fat depletion, severe muscle depletion Diet:  Diet Order             Diet regular Room service appropriate? Yes; Fluid consistency: Thin  Diet effective now                  DVT prophylaxis:  heparin injection 5,000 Units Start: 12/08/20 2200 SCD's Start: 12/08/20 1322   Antimicrobials: IV Zosyn Fluid: Off IV fluid Consultants: Vascular surgery Family Communication: Not at bedside  Status is: Inpatient  Remains inpatient appropriate because: Pending CIR  Dispo: The patient is from: Home              Anticipated d/c is to: PT eval appreciated.  CIR recommended.  Patient currently is medically stable to d/c.   Difficult to place patient No     Infusions:   magnesium sulfate bolus IVPB     piperacillin-tazobactam (ZOSYN)  IV 3.375 g (12/12/20 0528)    Scheduled Meds:  vitamin C  500 mg Oral Daily   atorvastatin  20 mg Oral Daily   carvedilol  6.25 mg Oral BID WC   clopidogrel  75 mg Oral Daily   docusate  sodium  100 mg Oral Daily   feeding supplement  237 mL Oral BID BM   heparin  5,000 Units Subcutaneous Q8H   multivitamin with minerals  1 tablet Oral Daily   nicotine  21 mg Transdermal Daily   pantoprazole  40 mg Oral Daily   senna  1 tablet Oral BID   zinc sulfate  220 mg Oral Daily    Antimicrobials: Anti-infectives (From admission, onward)    Start     Dose/Rate Route Frequency Ordered Stop   12/07/20 1730  piperacillin-tazobactam (ZOSYN) IVPB 3.375 g        3.375 g 12.5 mL/hr over 240 Minutes Intravenous Every 8 hours 12/07/20 1721         PRN meds: acetaminophen **OR** acetaminophen, albuterol, bisacodyl, guaiFENesin-dextromethorphan, hydrALAZINE, hydrOXYzine, magnesium sulfate bolus IVPB, methocarbamol, morphine injection, ondansetron, oxyCODONE-acetaminophen, phenol, potassium chloride, traMADol   Objective: Vitals:   12/12/20 0645 12/12/20 0907  BP: (!) 151/89 (!) 130/96  Pulse: 72 80  Resp: 18 18  Temp: 97.7 F (36.5 C) 98.2 F (36.8 C)  SpO2: 98% 100%    Intake/Output Summary (Last 24 hours) at 12/12/2020 1157 Last data filed at 12/12/2020 0908 Gross per 24 hour  Intake 949.5 ml  Output 1150 ml  Net -200.5 ml    Filed Weights   12/07/20 1415 12/08/20 0922  Weight: 68 kg 63.5 kg   Weight change:  Body mass index is 21.93 kg/m.   Physical Exam: General exam: Pleasant middle-aged African-American male.  Not in distress.  Pain controlled  skin: No rashes, lesions or ulcers. HEENT: Atraumatic, normocephalic, no obvious bleeding Lungs: Clear to auscultation bilaterally CVS: Regular rate and rhythm, no murmur GI/Abd soft, nontender, nondistended, bowel sound present CNS: Alert, awake, oriented x3 Psychiatry: Mood appropriate Extremities: Right leg with trace edema, left leg amputation stump with bandage on.  Data Review: I have personally reviewed the laboratory data and studies available.  Recent Labs  Lab 12/07/20 1441 12/08/20 0040  12/09/20 0254 12/10/20 0226 12/11/20 0156 12/12/20 0103  WBC 10.7* 11.4* 13.0* 11.6* 8.7 7.5  NEUTROABS 9.2*  --   --   --  6.5 6.2  HGB 9.3* 9.0* 7.4* 7.7* 7.6* 7.3*  HCT 27.6* 27.4* 21.7* 23.5* 22.9* 22.4*  MCV 75.4* 77.4* 74.3* 74.6* 75.1* 74.9*  PLT 621* 542* 502* 578* 565* 571*    Recent Labs  Lab 12/08/20 0040 12/09/20 0254 12/10/20 0226 12/11/20 0156 12/12/20 0103  NA 135 126* 136 133* 134*  K 3.3* 3.7 3.9 4.0 3.9  CL 102 96* 99 98 101  CO2 25 23 28 27 24   GLUCOSE 100* 109* 124* 150* 107*  BUN 10 8 7* 8 10  CREATININE 0.64 0.67 0.79 0.71 0.71  CALCIUM 8.4* 7.9* 8.7* 8.4* 8.3*     F/u labs ordered Unresulted Labs (From admission, onward)     Start     Ordered   12/11/20 0500  CBC with Differential/Platelet  Daily,   R      12/10/20 1620   12/11/20  0500  Basic metabolic panel  Daily,   R      12/10/20 1620   12/09/20 1456  Vitamin A  Once,   R        12/09/20 1510   12/09/20 1456  Vitamin C  Once,   R        12/09/20 1510   12/09/20 1456  Zinc  Once,   R        12/09/20 1510            Signed, Lorin Glass, MD [facility] 12/12/2020

## 2020-12-12 NOTE — Plan of Care (Signed)

## 2020-12-12 NOTE — Progress Notes (Signed)
OT Cancellation Note  Patient Details Name: Benjamin [name] MRN: [id number] DOB: January 01, 1961   Cancelled Treatment:    Reason Eval/Treat Not Completed: Pain limiting ability to participate.  Met with pt. For attempted skilled OT treatment session.  Pt. Reports he has recently had pain meds but is still feeling achy LLE and would like to rest for now.  States he "may be up for it later" but not now. Will attempt back as able.  Clearnce Sorrel Lorraine-COTA/L 12/12/2020, 11:04 AM

## 2020-12-12 NOTE — Plan of Care (Signed)
  Problem: Education: Goal: Knowledge of General Education information will improve Description: Including pain rating scale, medication(s)/side effects and non-pharmacologic comfort measures Outcome: Progressing   Problem: Clinical Measurements: Goal: Ability to maintain clinical measurements within normal limits will improve Outcome: Progressing Goal: Will remain free from infection Outcome: Progressing Goal: Respiratory complications will improve Outcome: Progressing   Problem: Activity: Goal: Risk for activity intolerance will decrease Outcome: Progressing   

## 2020-12-13 DIAGNOSIS — L0390 Cellulitis, unspecified: Secondary | ICD-10-CM | POA: Diagnosis not present

## 2020-12-13 LAB — CBC WITH DIFFERENTIAL/PLATELET
Abs Immature Granulocytes: 0.03 10*3/uL (ref 0.00–0.07)
Basophils Absolute: 0 10*3/uL (ref 0.0–0.1)
Basophils Relative: 0 %
Eosinophils Absolute: 0.2 10*3/uL (ref 0.0–0.5)
Eosinophils Relative: 2 %
HCT: 23.5 % — ABNORMAL LOW (ref 39.0–52.0)
Hemoglobin: 7.8 g/dL — ABNORMAL LOW (ref 13.0–17.0)
Immature Granulocytes: 0 %
Lymphocytes Relative: 23 %
Lymphs Abs: 1.7 10*3/uL (ref 0.7–4.0)
MCH: 24.8 pg — ABNORMAL LOW (ref 26.0–34.0)
MCHC: 33.2 g/dL (ref 30.0–36.0)
MCV: 74.6 fL — ABNORMAL LOW (ref 80.0–100.0)
Monocytes Absolute: 0.5 10*3/uL (ref 0.1–1.0)
Monocytes Relative: 7 %
Neutro Abs: 4.9 10*3/uL (ref 1.7–7.7)
Neutrophils Relative %: 68 %
Platelets: 597 10*3/uL — ABNORMAL HIGH (ref 150–400)
RBC: 3.15 MIL/uL — ABNORMAL LOW (ref 4.22–5.81)
RDW: 21 % — ABNORMAL HIGH (ref 11.5–15.5)
WBC: 7.3 10*3/uL (ref 4.0–10.5)
nRBC: 0 % (ref 0.0–0.2)

## 2020-12-13 LAB — BASIC METABOLIC PANEL
Anion gap: 8 (ref 5–15)
BUN: 9 mg/dL (ref 8–23)
CO2: 26 mmol/L (ref 22–32)
Calcium: 8.5 mg/dL — ABNORMAL LOW (ref 8.9–10.3)
Chloride: 99 mmol/L (ref 98–111)
Creatinine, Ser: 0.8 mg/dL (ref 0.61–1.24)
GFR, Estimated: >60 mL/min (ref >60)
Glucose, Bld: 114 mg/dL — ABNORMAL HIGH (ref 70–99)
Potassium: 4 mmol/L (ref 3.5–5.1)
Sodium: 133 mmol/L — ABNORMAL LOW (ref 135–145)

## 2020-12-13 LAB — AEROBIC/ANAEROBIC CULTURE W GRAM STAIN (SURGICAL/DEEP WOUND)

## 2020-12-13 NOTE — Plan of Care (Signed)

## 2020-12-13 NOTE — Progress Notes (Signed)
PROGRESS NOTE  Benjamin [name]  DOB: 07/18/1960  PCP: Arnette Felts, FNP QIO:[id number]  DOA: 12/07/2020  LOS: 6 days  Hospital Day: 7   Chief Complaint  Patient presents with   Wound Infection   Brief narrative: Benjamin [name] is a 60 y.o. male with PMH significant for HTN, HLD, heavy smoking, peripheral artery disease s/p bilateral femoral artery bypass graft who underwent left above-knee amputation, removal of infected left femoral-popliteal bypass graft, placement of antibiotic beads by vascular surgery on 10/04/2020 and was discharged to home with p.o. Bactrim for 6 weeks, oxycodone for pain as well as wound VAC and home health services.   Patient presented to the ED from home today with significant drainage from the amputation stump with possible bone exposure.  He has the symptoms ongoing for last several days.  He was seen earlier in the month by vascular surgeon Dr. Randie Heinz with ongoing discussion about additional surgery, had amputation and further courses of antibiotics.  Patient was reluctant to come to the hospital despite multiple requests by family and home health RN.   In the ED, patient was afebrile, heart rate 120s, blood pressure 170/108, respiratory rate 22 on room air 99% O2 sat Labs with WBC 10.7, hemoglobin 9.3, platelets 621, BUN/creatinine 11/0.64, albumin 2.6, lactic acid 1.2 Left femur x-ray showed above-knee amputation status on the left without evidence of osteomyelitis but possible soft tissue infection. Vascular surgery consultation was obtained.  Hospitalist service consulted for inpatient admission and management. See below for details  Subjective: Patient was seen and examined this morning.  Sitting up in bed.  Not in distress.  No new symptoms. Blood pressure heart rate both improved after Coreg dose was increased.  Assessment/Plan: Infected left AKA amputation stump -Admitted for infected stump site. -On 10/18, patient underwent revision of left  above-knee amputation. Purulent material was sent for culture.  Growing Enterobacter and MDR Klebsiella, but sensitive to IV Zosyn which patient is currently getting. -Remains afebrile, WC count normal.  Agree with vascular surgery.  Patient probably does not need Zosyn any longer. Recent Labs  Lab 12/07/20 1441 12/08/20 0040 12/09/20 0254 12/10/20 0226 12/11/20 0156 12/12/20 0103 12/13/20 0047  WBC 10.7*   < > 13.0* 11.6* 8.7 7.5 7.3  LATICACIDVEN 1.2  --   --   --   --   --   --    < > = values in this interval not displayed.     Peripheral artery disease Hyperlipidemia -Continue Plavix, Lipitor -Tramadol for pain.   Essential hypertension -Blood pressure and heart rate improved after Coreg dose was increased to 6.25 mg twice daily yesterday.   Heavy smoking -1 pack/day smoker.  Nicotine patch offered.  Counseled to quit.  Mobility: PT eval appreciated Code Status:   Code Status: Full Code  Nutritional status: Body mass index is 21.93 kg/m. Nutrition Problem: Severe Malnutrition Etiology: chronic illness (PAD, Wound healing) Signs/Symptoms: severe fat depletion, severe muscle depletion Diet:  Diet Order             Diet regular Room service appropriate? Yes; Fluid consistency: Thin  Diet effective now                  DVT prophylaxis:  heparin injection 5,000 Units Start: 12/08/20 2200 SCD's Start: 12/08/20 1322   Antimicrobials: IV Zosyn Fluid: Off IV fluid Consultants: Vascular surgery Family Communication: Not at bedside  Status is: Inpatient  Remains inpatient appropriate because: Pending CIR  Dispo: The patient  is from: Home              Anticipated d/c is to: PT eval appreciated.  CIR recommended.              Patient currently is medically stable to d/c.   Difficult to place patient No     Infusions:   magnesium sulfate bolus IVPB      Scheduled Meds:  vitamin C  500 mg Oral Daily   atorvastatin  20 mg Oral Daily   carvedilol  6.25  mg Oral BID WC   clopidogrel  75 mg Oral Daily   docusate sodium  100 mg Oral Daily   feeding supplement  237 mL Oral BID BM   heparin  5,000 Units Subcutaneous Q8H   multivitamin with minerals  1 tablet Oral Daily   nicotine  21 mg Transdermal Daily   pantoprazole  40 mg Oral Daily   senna  1 tablet Oral BID   zinc sulfate  220 mg Oral Daily    Antimicrobials: Anti-infectives (From admission, onward)    Start     Dose/Rate Route Frequency Ordered Stop   12/07/20 1730  piperacillin-tazobactam (ZOSYN) IVPB 3.375 g  Status:  Discontinued        3.375 g 12.5 mL/hr over 240 Minutes Intravenous Every 8 hours 12/07/20 1721 12/13/20 1205       PRN meds: acetaminophen **OR** acetaminophen, albuterol, bisacodyl, guaiFENesin-dextromethorphan, hydrALAZINE, hydrOXYzine, magnesium sulfate bolus IVPB, methocarbamol, morphine injection, ondansetron, oxyCODONE-acetaminophen, phenol, potassium chloride, traMADol   Objective: Vitals:   12/13/20 0359 12/13/20 1000  BP: (!) 157/98 128/72  Pulse: 85 93  Resp: 18 18  Temp: 98.5 F (36.9 C)   SpO2: 100% 100%    Intake/Output Summary (Last 24 hours) at 12/13/2020 1205 Last data filed at 12/13/2020 0900 Gross per 24 hour  Intake 1020 ml  Output 1470 ml  Net -450 ml    Filed Weights   12/07/20 1415 12/08/20 0922  Weight: 68 kg 63.5 kg   Weight change:  Body mass index is 21.93 kg/m.   Physical Exam: General exam: Pleasant middle-aged African-American male.  Not in distress.  Pain controlled. skin: No rashes, lesions or ulcers. HEENT: Atraumatic, normocephalic, no obvious bleeding Lungs: Clear to auscultation bilaterally CVS: Regular rate and rhythm, no murmur GI/Abd soft, nontender, nondistended, bowel sound present CNS: Alert, awake, oriented x3 Psychiatry: Mood appropriate Extremities: Right leg with trace edema, left leg amputation stump with bandage on.  Data Review: I have personally reviewed the laboratory data and studies  available.  Recent Labs  Lab 12/07/20 1441 12/08/20 0040 12/09/20 0254 12/10/20 0226 12/11/20 0156 12/12/20 0103 12/13/20 0047  WBC 10.7*   < > 13.0* 11.6* 8.7 7.5 7.3  NEUTROABS 9.2*  --   --   --  6.5 6.2 4.9  HGB 9.3*   < > 7.4* 7.7* 7.6* 7.3* 7.8*  HCT 27.6*   < > 21.7* 23.5* 22.9* 22.4* 23.5*  MCV 75.4*   < > 74.3* 74.6* 75.1* 74.9* 74.6*  PLT 621*   < > 502* 578* 565* 571* 597*   < > = values in this interval not displayed.    Recent Labs  Lab 12/09/20 0254 12/10/20 0226 12/11/20 0156 12/12/20 0103 12/13/20 0047  NA 126* 136 133* 134* 133*  K 3.7 3.9 4.0 3.9 4.0  CL 96* 99 98 101 99  CO2 23 28 27 24 26   GLUCOSE 109* 124* 150* 107* 114*  BUN 8 7*  8 10 9   CREATININE 0.67 0.79 0.71 0.71 0.80  CALCIUM 7.9* 8.7* 8.4* 8.3* 8.5*     F/u labs ordered Unresulted Labs (From admission, onward)     Start     Ordered   12/09/20 1456  Vitamin A  Once,   R        12/09/20 1510   12/09/20 1456  Vitamin C  Once,   R        12/09/20 1510            Signed, 12/11/20, MD [facility] 12/13/2020

## 2020-12-13 NOTE — Progress Notes (Signed)
Occupational Therapy Treatment Patient Details Name: Benjamin [name] MRN: [id number] DOB: [birth year] Today's Date: 12/13/2020   History of present illness Pt is 60 y/o M admitted 12/07/20 with necrotic left above-knee amputation site. 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   OT comments  Pt is progressing towards OT goals. Pt remains limited by pain and balance. During session, pt able to stand at sink for grooming activities with supervision. Pt also requiring supervision for bed mobility and Min guard for functional transfers/mobility. Continues to be appropriate for CIR at d/c. Will continue to follow acutely.   Recommendations for follow up therapy are one component of a multi-disciplinary discharge planning process, led by the attending physician.  Recommendations may be updated based on patient status, additional functional criteria and insurance authorization.    Follow Up Recommendations  CIR    Equipment Recommendations  3 in 1 bedside commode    Recommendations for Other Services      Precautions / Restrictions Precautions Precautions: Fall Precaution Comments: monitor for pain on L AK amp, HR Restrictions Weight Bearing Restrictions: Yes LLE Weight Bearing: Non weight bearing       Mobility Bed Mobility Overal bed mobility: Needs Assistance Bed Mobility: Supine to Sit     Supine to sit: Supervision          Transfers Overall transfer level: Needs assistance Equipment used: Rolling walker (2 wheeled) Transfers: Sit to/from Stand Sit to Stand: Min guard              Balance Overall balance assessment: Needs assistance Sitting-balance support: Feet supported;No upper extremity supported Sitting balance-Leahy Scale: Good     Standing balance support: During functional activity Standing balance-Leahy Scale: Poor                              ADL either performed or assessed with clinical judgement   ADL Overall ADL's : Needs assistance/impaired     Grooming: Supervision/safety;Wash/dry hands;Wash/dry face;Standing                               Functional mobility during ADLs: Min guard;Rolling walker General ADL Comments: Pt limited by pain and balance     Vision       Perception     Praxis      Cognition Arousal/Alertness: Awake/alert Behavior During Therapy: WFL for tasks assessed/performed Overall Cognitive Status: Within Functional Limits for tasks assessed                                          Exercises     Shoulder Instructions       General Comments      Pertinent Vitals/ Pain       Pain Assessment: 0-10 Pain Score: 7  Pain Location: left residual limb Pain Descriptors / Indicators: Sharp;Aching;Guarding;Grimacing Pain Intervention(s): Monitored during session;Repositioned  Home Living                                          Prior Functioning/Environment  Frequency  Min 2X/week        Progress Toward Goals  OT Goals(current goals can now be found in the care plan section)  Progress towards OT goals: Progressing toward goals  Acute Rehab OT Goals Patient Stated Goal: return home OT Goal Formulation: With patient Time For Goal Achievement: 12/23/20 Potential to Achieve Goals: Good ADL Goals Pt Will Perform Grooming: with modified independence;standing Pt Will Perform Lower Body Bathing: with modified independence;sitting/lateral leans Pt Will Perform Lower Body Dressing: with modified independence;sitting/lateral leans;sit to/from stand Pt Will Transfer to Toilet: with modified independence;ambulating;bedside commode Pt Will Perform Tub/Shower Transfer: Tub transfer;with modified independence;ambulating;shower seat;rolling walker  Plan Discharge plan remains appropriate     Co-evaluation                 AM-PAC OT "6 Clicks" Daily Activity     Outcome Measure   Help from another person eating meals?: None Help from another person taking care of personal grooming?: A Little Help from another person toileting, which includes using toliet, bedpan, or urinal?: A Little Help from another person bathing (including washing, rinsing, drying)?: A Little Help from another person to put on and taking off regular upper body clothing?: A Little Help from another person to put on and taking off regular lower body clothing?: A Little 6 Click Score: 19    End of Session Equipment Utilized During Treatment: Gait belt;Rolling walker  OT Visit Diagnosis: Unsteadiness on feet (R26.81);Other abnormalities of gait and mobility (R26.89);Pain Pain - Right/Left: Left Pain - part of body: Leg   Activity Tolerance Patient limited by pain   Patient Left in bed;with call bell/phone within reach   Nurse Communication Mobility status        Time: 6579-0383 OT Time Calculation (min): 14 min  Charges: OT General Charges $OT Visit: 1 Visit OT Treatments $Self Care/Home Management : 8-22 mins  Sabino Denning C, OT/L  Acute Rehab [phone]  Lenice Llamas 12/13/2020, 4:47 PM

## 2020-12-13 NOTE — Progress Notes (Signed)
   VASCULAR SURGERY ASSESSMENT & PLAN:   POD 5 LEFT AKA: His AKA is healing nicely. Continue daily dressing changes.    ID: He had purulent drainage in the proximal thigh. Intraoperative culture shows moderate Enterobacter and few Klebsiella he is on IV Zosyn.  Both of these bacteria are sensitive to Zosyn.  He has completed an additional 4 days of intravenous antibiotics postoperatively.  From my standpoint the Zosyn can be discontinued.   DVT PROPHYLAXIS: He is on SQ heparin.    Awaiting placement (CIR).   Vascular surgery will be available as needed.  I have arranged follow-up in 1 month for staple removal.   SUBJECTIVE:   No complaints this morning  PHYSICAL EXAM:   Vitals:   12/12/20 0907 12/12/20 1709 12/12/20 1928 12/13/20 0359  BP: (!) 130/96 (!) 140/91 128/82 (!) 157/98  Pulse: 80 91 92 85  Resp: 18 18 18 18   Temp: 98.2 F (36.8 C) 98.3 F (36.8 C) 98.7 F (37.1 C) 98.5 F (36.9 C)  TempSrc: Oral Oral Oral Oral  SpO2: 100% 100% 99% 100%  Weight:      Height:       The dressing on his left AKA is dry.  LABS:   Lab Results  Component Value Date   WBC 7.3 12/13/2020   HGB 7.8 (L) 12/13/2020   HCT 23.5 (L) 12/13/2020   MCV 74.6 (L) 12/13/2020   PLT 597 (H) 12/13/2020   Lab Results  Component Value Date   CREATININE 0.80 12/13/2020   Lab Results  Component Value Date   INR 1.1 12/07/2020    PROBLEM LIST:    Active Problems:   Wound cellulitis   Protein-calorie malnutrition, severe   CURRENT MEDS:    vitamin C  500 mg Oral Daily   atorvastatin  20 mg Oral Daily   carvedilol  6.25 mg Oral BID WC   clopidogrel  75 mg Oral Daily   docusate sodium  100 mg Oral Daily   feeding supplement  237 mL Oral BID BM   heparin  5,000 Units Subcutaneous Q8H   multivitamin with minerals  1 tablet Oral Daily   nicotine  21 mg Transdermal Daily   pantoprazole  40 mg Oral Daily   senna  1 tablet Oral BID   zinc sulfate  220 mg Oral Daily    12/09/2020 Office: [phone] 12/13/2020

## 2020-12-13 NOTE — Progress Notes (Addendum)
Pt refused dressing change at this time, educated. Pt continuously refused ace wrap over the stump. Will try again later.

## 2020-12-14 ENCOUNTER — Ambulatory Visit: Payer: Medicaid Other

## 2020-12-14 DIAGNOSIS — L0390 Cellulitis, unspecified: Secondary | ICD-10-CM | POA: Diagnosis not present

## 2020-12-14 LAB — VITAMIN A: Vitamin A (Retinoic Acid): 29.2 ug/dL (ref 22.0–69.5)

## 2020-12-14 NOTE — Progress Notes (Signed)
PROGRESS NOTE  Benjamin [name]  DOB: [birth year]  PCP: Arnette Felts, FNP ION:[id number]  DOA: 12/07/2020  LOS: 7 days  Hospital Day: 8   Chief Complaint  Patient presents with   Wound Infection   Brief narrative: Benjamin [name] is a 60 y.o. male with PMH significant for HTN, HLD, heavy smoking, peripheral artery disease s/p bilateral femoral artery bypass graft who underwent left above-knee amputation, removal of infected left femoral-popliteal bypass graft, placement of antibiotic beads by vascular surgery on 10/04/2020 and was discharged to home with p.o. Bactrim for 6 weeks, oxycodone for pain as well as wound VAC and home health services.   Patient presented to the ED from home today with significant drainage from the amputation stump with possible bone exposure.  He has the symptoms ongoing for last several days.  He was seen earlier in the month by vascular surgeon Dr. Randie Heinz with ongoing discussion about additional surgery, had amputation and further courses of antibiotics.  Patient was reluctant to come to the hospital despite multiple requests by family and home health RN.   In the ED, patient was afebrile, heart rate 120s, blood pressure 170/108, respiratory rate 22 on room air 99% O2 sat Labs with WBC 10.7, hemoglobin 9.3, platelets 621, BUN/creatinine 11/0.64, albumin 2.6, lactic acid 1.2 Left femur x-ray showed above-knee amputation status on the left without evidence of osteomyelitis but possible soft tissue infection. Vascular surgery consultation was obtained.  Hospitalist service consulted for inpatient admission and management. See below for details  Subjective: Patient was seen and examined this morning.   Sitting up in bed.  Not in distress.  No new symptoms. Hemodynamically stable Pending perioperative CIR  Assessment/Plan: Infected left AKA amputation stump -Admitted for infected stump site. -On 10/18, patient underwent revision of left above-knee amputation.  Purulent material was sent for culture.  Grew Enterobacter and MDR Klebsiella.  Completed course of IV Zosyn. -Remains afebrile, WBC count normal. Recent Labs  Lab 12/07/20 1441 12/08/20 0040 12/09/20 0254 12/10/20 0226 12/11/20 0156 12/12/20 0103 12/13/20 0047  WBC 10.7*   < > 13.0* 11.6* 8.7 7.5 7.3  LATICACIDVEN 1.2  --   --   --   --   --   --    < > = values in this interval not displayed.     Peripheral artery disease Hyperlipidemia -Continue Plavix, Lipitor -Tramadol for pain.   Essential hypertension -Blood pressure and heart rate improved after Coreg dose was increased to 6.25 mg twice daily   Heavy smoking -1 pack/day smoker.  Nicotine patch offered.  Counseled to quit.  Mobility: PT eval appreciated Code Status:   Code Status: Full Code  Nutritional status: Body mass index is 21.93 kg/m. Nutrition Problem: Severe Malnutrition Etiology: chronic illness (PAD, Wound healing) Signs/Symptoms: severe fat depletion, severe muscle depletion Diet:  Diet Order             Diet regular Room service appropriate? Yes; Fluid consistency: Thin  Diet effective now                  DVT prophylaxis:  heparin injection 5,000 Units Start: 12/08/20 2200 SCD's Start: 12/08/20 1322   Antimicrobials: IV Zosyn Fluid: Off IV fluid Consultants: Vascular surgery Family Communication: Not at bedside  Status is: Inpatient  Remains inpatient appropriate because: Pending bed at [facility]  Dispo: The patient is from: Home              Anticipated d/c is to: PT eval  appreciated.  CIR recommended.              Patient currently is medically stable to d/c.   Difficult to place patient No     Infusions:   magnesium sulfate bolus IVPB      Scheduled Meds:  vitamin C  500 mg Oral Daily   atorvastatin  20 mg Oral Daily   carvedilol  6.25 mg Oral BID WC   clopidogrel  75 mg Oral Daily   docusate sodium  100 mg Oral Daily   feeding supplement  237 mL Oral BID BM   heparin   5,000 Units Subcutaneous Q8H   multivitamin with minerals  1 tablet Oral Daily   nicotine  21 mg Transdermal Daily   pantoprazole  40 mg Oral Daily   senna  1 tablet Oral BID   zinc sulfate  220 mg Oral Daily    Antimicrobials: Anti-infectives (From admission, onward)    Start     Dose/Rate Route Frequency Ordered Stop   12/07/20 1730  piperacillin-tazobactam (ZOSYN) IVPB 3.375 g  Status:  Discontinued        3.375 g 12.5 mL/hr over 240 Minutes Intravenous Every 8 hours 12/07/20 1721 12/13/20 1205       PRN meds: acetaminophen **OR** acetaminophen, albuterol, bisacodyl, guaiFENesin-dextromethorphan, hydrALAZINE, hydrOXYzine, magnesium sulfate bolus IVPB, methocarbamol, morphine injection, ondansetron, oxyCODONE-acetaminophen, phenol, potassium chloride, traMADol   Objective: Vitals:   12/14/20 0531 12/14/20 0849  BP: (!) 137/103 116/83  Pulse: 96 100  Resp: 18 18  Temp: 97.7 F (36.5 C) 98.2 F (36.8 C)  SpO2: 98% 99%    Intake/Output Summary (Last 24 hours) at 12/14/2020 1350 Last data filed at 12/14/2020 1344 Gross per 24 hour  Intake 850 ml  Output 580 ml  Net 270 ml    Filed Weights   12/07/20 1415 12/08/20 0922  Weight: 68 kg 63.5 kg   Weight change:  Body mass index is 21.93 kg/m.   Physical Exam: General exam: Pleasant middle-aged African-American male.  Not in distress. Pain controlled. skin: No rashes, lesions or ulcers. HEENT: Atraumatic, normocephalic, no obvious bleeding Lungs: Clear to auscultation bilaterally CVS: Regular rate and rhythm, no murmur GI/Abd soft, nontender, nondistended, bowel sound present CNS: Alert, awake, oriented x3 Psychiatry: Mood appropriate Extremities: Right leg with trace edema, left leg amputation stump with bandage on.  Data Review: I have personally reviewed the laboratory data and studies available.  Recent Labs  Lab 12/07/20 1441 12/08/20 0040 12/09/20 0254 12/10/20 0226 12/11/20 0156 12/12/20 0103  12/13/20 0047  WBC 10.7*   < > 13.0* 11.6* 8.7 7.5 7.3  NEUTROABS 9.2*  --   --   --  6.5 6.2 4.9  HGB 9.3*   < > 7.4* 7.7* 7.6* 7.3* 7.8*  HCT 27.6*   < > 21.7* 23.5* 22.9* 22.4* 23.5*  MCV 75.4*   < > 74.3* 74.6* 75.1* 74.9* 74.6*  PLT 621*   < > 502* 578* 565* 571* 597*   < > = values in this interval not displayed.    Recent Labs  Lab 12/09/20 0254 12/10/20 0226 12/11/20 0156 12/12/20 0103 12/13/20 0047  NA 126* 136 133* 134* 133*  K 3.7 3.9 4.0 3.9 4.0  CL 96* 99 98 101 99  CO2 23 28 27 24 26   GLUCOSE 109* 124* 150* 107* 114*  BUN 8 7* 8 10 9   CREATININE 0.67 0.79 0.71 0.71 0.80  CALCIUM 7.9* 8.7* 8.4* 8.3* 8.5*  F/u labs ordered Unresulted Labs (From admission, onward)     Start     Ordered   12/09/20 1456  Vitamin C  Once,   R        12/09/20 1510            Signed, Lorin Glass, MD [facility] 12/14/2020

## 2020-12-14 NOTE — Progress Notes (Signed)
Inpatient Rehab Admissions Coordinator:   Awaiting determination from [facility] regarding prior auth request for CIR.  Updated pt at bedside.   Estill Dooms, PT, DPT Admissions Coordinator [phone] 12/14/20  3:33 PM

## 2020-12-14 NOTE — Progress Notes (Signed)
PT Cancellation Note  Patient Details Name: Benjamin [name] MRN: [id number] DOB: Jun 27, 1960   Cancelled Treatment:    Reason Eval/Treat Not Completed: Patient declined, no reason specified (Pt. declines therapy at this time, states he just had dressing change and reports being in inc pain, 8/10. NSG notified that pt. is requesting pain medicine.)  Pt. Is encouraged to amb with NSG/PCT later in day in room if pain is better under control. Demos understanding.  Benjamin [name], PT, DPT Acute Rehabilitation Services Office: [phone]  Benjamin [name] 12/14/2020, 10:56 AM

## 2020-12-14 NOTE — Progress Notes (Signed)
Mobility Specialist Progress Note    12/14/20 1422  Mobility  Activity Ambulated in hall  Level of Assistance Minimal assist, patient does 75% or more  Assistive Device Front wheel walker  Distance Ambulated (ft) 280 ft  Mobility Ambulated with assistance in hallway  Mobility Response Tolerated well  Mobility performed by Mobility specialist  Bed Position Chair  $Mobility charge 1 Mobility   Pt received in bed and agreeable. C/o soreness and stiffness in L stump. Encouraged WB in R foot. Returned to chair with call bell in reach.   [place] Mobility Specialist  Mobility Specialist Phone: [phone]

## 2020-12-14 NOTE — Plan of Care (Signed)
  Problem: Nutrition: Goal: Adequate nutrition will be maintained Outcome: Progressing   Problem: Pain Managment: Goal: General experience of comfort will improve Outcome: Progressing   

## 2020-12-15 ENCOUNTER — Other Ambulatory Visit: Payer: Self-pay

## 2020-12-15 ENCOUNTER — Inpatient Hospital Stay (HOSPITAL_COMMUNITY)
Admission: RE | Admit: 2020-12-15 | Discharge: 2020-12-25 | DRG: 559 | Disposition: A | Discharge status: Skilled Nursing Facility | Payer: Medicaid Other | Source: Intra-hospital | Attending: Physical Medicine and Rehabilitation | Admitting: Physical Medicine and Rehabilitation

## 2020-12-15 ENCOUNTER — Encounter (HOSPITAL_COMMUNITY): Payer: Self-pay | Admitting: Physical Medicine and Rehabilitation

## 2020-12-15 DIAGNOSIS — I10 Essential (primary) hypertension: Secondary | ICD-10-CM | POA: Diagnosis present

## 2020-12-15 DIAGNOSIS — Z4781 Encounter for orthopedic aftercare following surgical amputation: Principal | ICD-10-CM

## 2020-12-15 DIAGNOSIS — R64 Cachexia: Secondary | ICD-10-CM | POA: Diagnosis present

## 2020-12-15 DIAGNOSIS — Z820 Family history of epilepsy and other diseases of the nervous system: Secondary | ICD-10-CM | POA: Diagnosis not present

## 2020-12-15 DIAGNOSIS — Z886 Allergy status to analgesic agent status: Secondary | ICD-10-CM

## 2020-12-15 DIAGNOSIS — Z20822 Contact with and (suspected) exposure to covid-19: Secondary | ICD-10-CM | POA: Diagnosis present

## 2020-12-15 DIAGNOSIS — Z23 Encounter for immunization: Secondary | ICD-10-CM | POA: Diagnosis not present

## 2020-12-15 DIAGNOSIS — Z681 Body mass index (BMI) 19 or less, adult: Secondary | ICD-10-CM | POA: Diagnosis not present

## 2020-12-15 DIAGNOSIS — G546 Phantom limb syndrome with pain: Secondary | ICD-10-CM | POA: Diagnosis present

## 2020-12-15 DIAGNOSIS — Z8261 Family history of arthritis: Secondary | ICD-10-CM

## 2020-12-15 DIAGNOSIS — Z825 Family history of asthma and other chronic lower respiratory diseases: Secondary | ICD-10-CM

## 2020-12-15 DIAGNOSIS — E785 Hyperlipidemia, unspecified: Secondary | ICD-10-CM | POA: Diagnosis present

## 2020-12-15 DIAGNOSIS — E876 Hypokalemia: Secondary | ICD-10-CM | POA: Diagnosis not present

## 2020-12-15 DIAGNOSIS — I739 Peripheral vascular disease, unspecified: Secondary | ICD-10-CM | POA: Diagnosis present

## 2020-12-15 DIAGNOSIS — R7303 Prediabetes: Secondary | ICD-10-CM | POA: Diagnosis present

## 2020-12-15 DIAGNOSIS — Z79899 Other long term (current) drug therapy: Secondary | ICD-10-CM

## 2020-12-15 DIAGNOSIS — G4700 Insomnia, unspecified: Secondary | ICD-10-CM | POA: Diagnosis present

## 2020-12-15 DIAGNOSIS — E43 Unspecified severe protein-calorie malnutrition: Secondary | ICD-10-CM | POA: Diagnosis present

## 2020-12-15 DIAGNOSIS — D62 Acute posthemorrhagic anemia: Secondary | ICD-10-CM | POA: Diagnosis present

## 2020-12-15 DIAGNOSIS — S78112A Complete traumatic amputation at level between left hip and knee, initial encounter: Secondary | ICD-10-CM | POA: Diagnosis present

## 2020-12-15 DIAGNOSIS — F17210 Nicotine dependence, cigarettes, uncomplicated: Secondary | ICD-10-CM | POA: Diagnosis present

## 2020-12-15 DIAGNOSIS — E871 Hypo-osmolality and hyponatremia: Secondary | ICD-10-CM | POA: Diagnosis present

## 2020-12-15 DIAGNOSIS — Z823 Family history of stroke: Secondary | ICD-10-CM

## 2020-12-15 DIAGNOSIS — Z91018 Allergy to other foods: Secondary | ICD-10-CM | POA: Diagnosis not present

## 2020-12-15 DIAGNOSIS — Z7902 Long term (current) use of antithrombotics/antiplatelets: Secondary | ICD-10-CM

## 2020-12-15 DIAGNOSIS — Z89612 Acquired absence of left leg above knee: Secondary | ICD-10-CM | POA: Diagnosis not present

## 2020-12-15 DIAGNOSIS — L0390 Cellulitis, unspecified: Secondary | ICD-10-CM | POA: Diagnosis not present

## 2020-12-15 DIAGNOSIS — K219 Gastro-esophageal reflux disease without esophagitis: Secondary | ICD-10-CM | POA: Diagnosis present

## 2020-12-15 LAB — VITAMIN C: Vitamin C: 0.2 mg/dL — ABNORMAL LOW (ref 0.4–2.0)

## 2020-12-15 MED ORDER — ADULT MULTIVITAMIN W/MINERALS CH
1.0000 | ORAL_TABLET | Freq: Every day | ORAL | Status: DC
  Administered 2020-12-16 – 2020-12-25 (×10): 1 via ORAL
  Filled 2020-12-15 (×10): qty 1

## 2020-12-15 MED ORDER — CARVEDILOL 6.25 MG PO TABS
6.2500 mg | ORAL_TABLET | Freq: Two times a day (BID) | ORAL | Status: DC
  Administered 2020-12-15 – 2020-12-25 (×21): 6.25 mg via ORAL
  Filled 2020-12-15 (×22): qty 1

## 2020-12-15 MED ORDER — INFLUENZA VAC SPLIT QUAD 0.5 ML IM SUSY
0.5000 mL | PREFILLED_SYRINGE | INTRAMUSCULAR | Status: AC
  Administered 2020-12-16: 0.5 mL via INTRAMUSCULAR
  Filled 2020-12-15: qty 0.5

## 2020-12-15 MED ORDER — BOOST / RESOURCE BREEZE PO LIQD CUSTOM
1.0000 | Freq: Two times a day (BID) | ORAL | Status: DC
  Administered 2020-12-15: 1 via ORAL

## 2020-12-15 MED ORDER — PROCHLORPERAZINE MALEATE 5 MG PO TABS: 5.0000 mg | ORAL_TABLET | Freq: Four times a day (QID) | ORAL | Status: DC | PRN

## 2020-12-15 MED ORDER — ALUM & MAG HYDROXIDE-SIMETH 200-200-20 MG/5ML PO SUSP: 30.0000 mL | ORAL | Status: DC | PRN

## 2020-12-15 MED ORDER — ALBUTEROL SULFATE (2.5 MG/3ML) 0.083% IN NEBU: 2.5000 mg | INHALATION_SOLUTION | Freq: Four times a day (QID) | RESPIRATORY_TRACT | Status: DC | PRN

## 2020-12-15 MED ORDER — GUAIFENESIN-DM 100-10 MG/5ML PO SYRP: 15.0000 mL | ORAL_SOLUTION | ORAL | 0 refills | Status: DC | PRN

## 2020-12-15 MED ORDER — BOOST / RESOURCE BREEZE PO LIQD CUSTOM
1.0000 | Freq: Two times a day (BID) | ORAL | Status: DC
  Administered 2020-12-16 – 2020-12-25 (×18): 1 via ORAL

## 2020-12-15 MED ORDER — TRAZODONE HCL 50 MG PO TABS
25.0000 mg | ORAL_TABLET | Freq: Every evening | ORAL | Status: DC | PRN
  Administered 2020-12-15 – 2020-12-24 (×8): 50 mg via ORAL
  Filled 2020-12-15 (×7): qty 1

## 2020-12-15 MED ORDER — METHOCARBAMOL 500 MG PO TABS
500.0000 mg | ORAL_TABLET | Freq: Four times a day (QID) | ORAL | Status: DC | PRN
  Administered 2020-12-15 – 2020-12-25 (×23): 500 mg via ORAL
  Filled 2020-12-15 (×25): qty 1

## 2020-12-15 MED ORDER — FLEET ENEMA 7-19 GM/118ML RE ENEM: 1.0000 | ENEMA | Freq: Once | RECTAL | Status: DC | PRN

## 2020-12-15 MED ORDER — CLOPIDOGREL BISULFATE 75 MG PO TABS
75.0000 mg | ORAL_TABLET | Freq: Every day | ORAL | Status: DC
  Administered 2020-12-16 – 2020-12-25 (×10): 75 mg via ORAL
  Filled 2020-12-15 (×10): qty 1

## 2020-12-15 MED ORDER — ASCORBIC ACID 500 MG PO TABS
500.0000 mg | ORAL_TABLET | Freq: Every day | ORAL | Status: DC
  Administered 2020-12-16 – 2020-12-25 (×10): 500 mg via ORAL
  Filled 2020-12-15 (×10): qty 1

## 2020-12-15 MED ORDER — CARVEDILOL 6.25 MG PO TABS: 6.2500 mg | ORAL_TABLET | Freq: Two times a day (BID) | ORAL | Status: DC

## 2020-12-15 MED ORDER — PROCHLORPERAZINE 25 MG RE SUPP: 12.5000 mg | Freq: Four times a day (QID) | RECTAL | Status: DC | PRN

## 2020-12-15 MED ORDER — DIPHENHYDRAMINE HCL 12.5 MG/5ML PO ELIX: 12.5000 mg | ORAL_SOLUTION | Freq: Four times a day (QID) | ORAL | Status: DC | PRN

## 2020-12-15 MED ORDER — PROCHLORPERAZINE EDISYLATE 10 MG/2ML IJ SOLN: 5.0000 mg | Freq: Four times a day (QID) | INTRAMUSCULAR | Status: DC | PRN

## 2020-12-15 MED ORDER — BISACODYL 5 MG PO TBEC: 5.0000 mg | DELAYED_RELEASE_TABLET | Freq: Every day | ORAL | 0 refills | Status: DC | PRN

## 2020-12-15 MED ORDER — OXYCODONE-ACETAMINOPHEN 5-325 MG PO TABS
1.0000 | ORAL_TABLET | ORAL | Status: DC | PRN
  Administered 2020-12-15 – 2020-12-22 (×27): 2 via ORAL
  Filled 2020-12-15 (×29): qty 2

## 2020-12-15 MED ORDER — GUAIFENESIN-DM 100-10 MG/5ML PO SYRP
15.0000 mL | ORAL_SOLUTION | ORAL | Status: DC | PRN
Start: 2020-12-15 — End: 2020-12-26

## 2020-12-15 MED ORDER — ACETAMINOPHEN 325 MG PO TABS
325.0000 mg | ORAL_TABLET | ORAL | Status: DC | PRN
  Administered 2020-12-20 – 2020-12-23 (×4): 650 mg via ORAL
  Filled 2020-12-15 (×4): qty 2

## 2020-12-15 MED ORDER — NICOTINE 21 MG/24HR TD PT24
21.0000 mg | MEDICATED_PATCH | Freq: Every day | TRANSDERMAL | Status: DC
  Administered 2020-12-16 – 2020-12-25 (×10): 21 mg via TRANSDERMAL
  Filled 2020-12-15 (×11): qty 1

## 2020-12-15 MED ORDER — PROSOURCE PLUS PO LIQD
30.0000 mL | Freq: Two times a day (BID) | ORAL | Status: DC
  Administered 2020-12-16 – 2020-12-25 (×20): 30 mL via ORAL
  Filled 2020-12-15 (×20): qty 30

## 2020-12-15 MED ORDER — ADULT MULTIVITAMIN W/MINERALS CH: 1.0000 | ORAL_TABLET | Freq: Every day | ORAL | Status: DC

## 2020-12-15 MED ORDER — ENOXAPARIN SODIUM 40 MG/0.4ML IJ SOSY
40.0000 mg | PREFILLED_SYRINGE | INTRAMUSCULAR | Status: DC
Start: 2020-12-15 — End: 2020-12-26
  Administered 2020-12-15 – 2020-12-25 (×11): 40 mg via SUBCUTANEOUS
  Filled 2020-12-15 (×11): qty 0.4

## 2020-12-15 MED ORDER — POLYETHYLENE GLYCOL 3350 17 G PO PACK: 17.0000 g | PACK | Freq: Every day | ORAL | Status: DC | PRN

## 2020-12-15 MED ORDER — DOCUSATE SODIUM 100 MG PO CAPS
100.0000 mg | ORAL_CAPSULE | Freq: Every day | ORAL | Status: DC
  Administered 2020-12-16 – 2020-12-25 (×10): 100 mg via ORAL
  Filled 2020-12-15 (×10): qty 1

## 2020-12-15 MED ORDER — GUAIFENESIN-DM 100-10 MG/5ML PO SYRP: 5.0000 mL | ORAL_SOLUTION | Freq: Four times a day (QID) | ORAL | Status: DC | PRN

## 2020-12-15 MED ORDER — PHENOL 1.4 % MT LIQD
1.0000 | OROMUCOSAL | Status: DC | PRN
  Filled 2020-12-15: qty 177

## 2020-12-15 MED ORDER — PANTOPRAZOLE SODIUM 40 MG PO TBEC
40.0000 mg | DELAYED_RELEASE_TABLET | Freq: Every day | ORAL | Status: DC
  Administered 2020-12-16 – 2020-12-25 (×10): 40 mg via ORAL
  Filled 2020-12-15 (×13): qty 1

## 2020-12-15 MED ORDER — DOCUSATE SODIUM 100 MG PO CAPS: 100.0000 mg | ORAL_CAPSULE | Freq: Every day | ORAL | 0 refills | Status: DC

## 2020-12-15 MED ORDER — ATORVASTATIN CALCIUM 10 MG PO TABS
20.0000 mg | ORAL_TABLET | Freq: Every day | ORAL | Status: DC
  Administered 2020-12-16 – 2020-12-25 (×10): 20 mg via ORAL
  Filled 2020-12-15 (×10): qty 2

## 2020-12-15 MED ORDER — BISACODYL 10 MG RE SUPP: 10.0000 mg | Freq: Every day | RECTAL | Status: DC | PRN

## 2020-12-15 MED ORDER — SENNA 8.6 MG PO TABS
1.0000 | ORAL_TABLET | Freq: Two times a day (BID) | ORAL | Status: DC
  Administered 2020-12-15 – 2020-12-25 (×21): 8.6 mg via ORAL
  Filled 2020-12-15 (×21): qty 1

## 2020-12-15 MED ORDER — HYDROXYZINE HCL 25 MG PO TABS
25.0000 mg | ORAL_TABLET | Freq: Every evening | ORAL | Status: DC | PRN
  Administered 2020-12-15: 25 mg via ORAL
  Filled 2020-12-15: qty 1

## 2020-12-15 MED ORDER — METHOCARBAMOL 500 MG PO TABS: 500.0000 mg | ORAL_TABLET | Freq: Four times a day (QID) | ORAL | Status: DC | PRN

## 2020-12-15 MED ORDER — OXYCODONE-ACETAMINOPHEN 5-325 MG PO TABS: 1.0000 | ORAL_TABLET | ORAL | 0 refills | Status: DC | PRN

## 2020-12-15 MED ORDER — TRAMADOL HCL 50 MG PO TABS
50.0000 mg | ORAL_TABLET | Freq: Two times a day (BID) | ORAL | Status: DC | PRN
  Administered 2020-12-15 – 2020-12-21 (×2): 50 mg via ORAL
  Filled 2020-12-15 (×4): qty 1

## 2020-12-15 MED ORDER — PROSOURCE PLUS PO LIQD
30.0000 mL | Freq: Two times a day (BID) | ORAL | Status: DC
  Administered 2020-12-15: 30 mL via ORAL
  Filled 2020-12-15: qty 30

## 2020-12-15 MED ORDER — ZINC SULFATE 220 (50 ZN) MG PO CAPS
220.0000 mg | ORAL_CAPSULE | Freq: Every day | ORAL | Status: DC
  Administered 2020-12-16 – 2020-12-25 (×10): 220 mg via ORAL
  Filled 2020-12-15 (×11): qty 1

## 2020-12-15 NOTE — Progress Notes (Signed)
Mobility Specialist Progress Note    12/15/20 1251  Mobility  Activity Ambulated in hall  Level of Assistance Standby assist, set-up cues, supervision of patient - no hands on  Assistive Device Front wheel walker  Distance Ambulated (ft) 110 ft  Mobility Ambulated with assistance in hallway  Mobility Response Tolerated well  Mobility performed by Mobility specialist  Bed Position Chair  $Mobility charge 1 Mobility   Pt received in bed and agreeable. C/o some pain while moving stump in prep to stand, but otherwise no complaints. Returned to chair with call bell in reach.   [place] Mobility Specialist  Mobility Specialist Phone: [phone]

## 2020-12-15 NOTE — Progress Notes (Signed)
Nutrition Follow-up  DOCUMENTATION CODES:   Severe malnutrition in context of chronic illness  INTERVENTION:   Encourage good PO intake  Discontinue Ensure Enlive   Boost Breeze po BID, each supplement provides 250 kcal and 9 grams of protein  30 ml ProSource Plus BID, each supplement provides 100 kcals and 15 grams protein.   Continue multivitamin w/ minerals daily  Provided menu assistance and education  NUTRITION DIAGNOSIS:   Severe Malnutrition related to chronic illness (PAD, Wound healing) as evidenced by severe fat depletion, severe muscle depletion. - Ongoing   GOAL:   Patient will meet greater than or equal to 90% of their needs - Progressing  MONITOR:   PO intake, Supplement acceptance, Skin, Weight trends  REASON FOR ASSESSMENT:   Consult Wound healing  ASSESSMENT:   60 y.o. male presented to the ED with drainage from amputation stump and possible bone exposure. PMH include HTN, HLD, and PAD. 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   Patient transitioning to CIR today.  Per pt, things are going good and that he is eating okay. Pt is requesting to be on a different diet, explained to pt that he is on a regular diet and an order whatever items off the menu that he would like. Found menu in pts room and went over the number to call and all the options he has.   Per EMR, pt intake includes: 10/22: Lunch 35% 10/23: Breakfast 100%, Lunch 100%, Dinner 100% 10/24: Breakfast 95%, Dinner 100% 10/25: Breakfast 100%  Pt reports that he has not been drinking the Ensure and that he's been getting Boost. States that the Ensure makes him use the bathroom. Discussed that Boost Breeze does not have as much protein and that it is best paired with ProSource to help with his wound healing. Pt agreed to trying ProSource in addition to the [employer].   Medications reviewed and include: Vitamin C, Colace, Protonix, MVI, Senokot, Zinc Labs reviewed:  Sodium 133, Vitamin C 0.2, Zinc 46, Vitamin A 29.2   Diet Order:   Diet Order             Diet regular Room service appropriate? Yes; Fluid consistency: Thin  Diet effective now                   EDUCATION NEEDS:   No education needs have been identified at this time  Skin:  Skin Assessment: Skin Integrity Issues: Skin Integrity Issues:: Incisions Incisions: L Thigh  Last BM:  12/13/2020  Height:   Ht Readings from Last 1 Encounters:  12/08/20 5\' 7"  (1.702 m)    Weight:   Wt Readings from Last 1 Encounters:  12/08/20 63.5 kg    Ideal Body Weight:  67.3 kg  BMI:  Body mass index is 21.93 kg/m.  Estimated Nutritional Needs:   Kcal:  1900-2100  Protein:  95-110 grams  Fluid:  >/= 1.9 L    Callan Norden BS, PLDN Clinical Dietitian See AMiON for contact information.

## 2020-12-15 NOTE — H&P (Signed)
Physical Medicine and Rehabilitation Admission H&P    Chief Complaint  Patient presents with   Functional deficits due to L AKA     HPI:  Benjamin [name] is a 60 year old male with history of PAD s/p multiple BLE bypass grafts, ongoing heavy tobacco abuse, L-AKA, removal of infected femoral-popliteal graft 10/04/20 with wound dehiscence,bone exposure,drainage and cellulitis. He was started on IV Zosyn and cultures grew out Enterococcus, Entrobacter and Klebsiella. He underwent wound revision of L-AKA on 10/18 by Dr. Scot Dock and post op tachycardia treated with low dose coreg.  He completed course of antibiotics on 10/23 and leucocytosis has resolve. Anemia being monitored with H/H stable at 7.8 and Na levels continue to fluctuate. Therapy ongoing and patient limited by pain, weakness and fatigue. CIR recommended due to functional decline.   Pt reports that he just had dressing placed before walking with PT- asked me not to assess L AKA- per nursing C/D/I. Staples intact.  Hard to lay in bed- it's painful- likes bedside chair better.  Phantom pain esp at night.  LBM today- peeing well.    Review of Systems  Constitutional:  Negative for chills and fever.  HENT:  Positive for hearing loss (mild). Negative for tinnitus.   Eyes:  Negative for blurred vision and double vision.  Respiratory:  Negative for cough and shortness of breath.   Cardiovascular:  Negative for chest pain and palpitations.  Gastrointestinal:  Positive for constipation. Negative for heartburn and nausea.  Genitourinary:  Negative for dysuria and urgency.  Musculoskeletal:  Negative for back pain, joint pain and myalgias.  Skin:  Negative for rash.  Neurological:  Positive for dizziness. Negative for focal weakness, weakness and headaches.  Psychiatric/Behavioral:  The patient is not nervous/anxious and does not have insomnia.   All other systems reviewed and are negative.   Past Medical History:  Diagnosis Date    Back pain    Heavy smoker    Hyperlipidemia LDL goal <70    Hypertension    Peripheral arterial occlusive disease ([place]) 06/2013   Bilateral femoral artery disease    Past Surgical History:  Procedure Laterality Date   ABDOMINAL AORTOGRAM W/LOWER EXTREMITY N/A 07/12/2016   Procedure: Abdominal Aortogram w/Lower Extremity;  Surgeon: Serafina Mitchell, MD;  Location: [place] [facility];  Service: Cardiovascular;  Laterality: N/A;  Bilateral extermity: Patent Renal As. No sig Dz in infrarenal Abd Aorta. Normal Bilat Iliac arteries. R SFA is 100% @ origin - recon in AK-Pop A. R PT A patent. L CFA occluded. L PFA recon @ origin, L SFA occluded w/ recon in AK Pop A.   ABDOMINAL AORTOGRAM W/LOWER EXTREMITY N/A 10/15/2019   Procedure: ABDOMINAL AORTOGRAM W/LOWER EXTREMITY;  Surgeon: Serafina Mitchell, MD;  Location: [place] [facility];  Service: Cardiovascular;  Laterality: N/A;   AMPUTATION Left 10/04/2020   Procedure: LEFT ABOVE KNEE AMPUTATION;  Surgeon: Serafina Mitchell, MD;  Location: [place];  Service: Vascular;  Laterality: Left;   AMPUTATION Left 12/08/2020   Procedure: RIGHT ABOVE KNEE AMPUTATION REVISION;  Surgeon: Angelia Mould, MD;  Location: [facility];  Service: Vascular;  Laterality: Left;   AORTA - BILATERAL FEMORAL ARTERY BYPASS GRAFT N/A 05/08/2020   Procedure: AORTA BIFEMORAL BYPASS GRAFT;  Surgeon: Serafina Mitchell, MD;  Location: [place];  Service: Vascular;  Laterality: N/A;   COLONOSCOPY  06/2016   never   ENDARTERECTOMY FEMORAL Left 11/14/2019   Procedure: Left groin exploration, Redo left  femoral artery exposure;  Surgeon: Serafina Mitchell, MD;  Location[facility];  Service: Vascular;  Laterality: Left;   FEMORAL-FEMORAL BYPASS GRAFT N/A 11/13/2019   Procedure: BYPASS GRAFT FEMORAL-FEMORAL ARTERY RIGHT TO LEFT USING HEMASHIELD GOLD GRAFT 28m x 30cm;  Surgeon: BSerafina Mitchell MD;  [facility];  Service: Vascular;  Laterality: N/A;   FEMORAL-POPLITEAL BYPASS GRAFT Left  11/13/2019   Procedure: LEFT FEMORAL BELOW KNEE-POPLITEAL ARTERY USING NON-REVERSED GREATER SAPHENOUS VEIN;  Surgeon: BSerafina Mitchell MD;  Location: [facility];  Service: Vascular;  Laterality: Left;   FEMORAL-POPLITEAL BYPASS GRAFT Left 05/08/2020   Procedure: LEFT REDO BYPASS GRAFT COMMON FEMORAL- BELOW KNEE POPLITEAL ARTERY USING PROPATEN GRAFT;  Surgeon: BSerafina Mitchell MD;  Location: [facility] OR;  Service: Vascular;  Laterality: Left;   I & D EXTREMITY Left 09/07/2020   Procedure: IRRIGATION AND DEBRIDEMENT LEFT LEG WITH EXCISION OF LEFT LEG DISTAL BYPASS GRAFT;  Surgeon: CMarty Heck MD;  [place] [facility]  Service: Vascular;  Laterality: Left;   LOWER EXTREMITY ANGIOGRAM Left 11/14/2019   Procedure: LEFT LOWER EXTREMITY ANGIOGRAM, BYPASS GRAFT ANGIOPLASTY;  Surgeon: BSerafina Mitchell MD;  Location: [facility];  Service: Vascular;  Laterality: Left;   PERIPHERAL VASCULAR INTERVENTION  10/15/2019   Procedure: PERIPHERAL VASCULAR INTERVENTION;  Surgeon: BSerafina Mitchell MD;  [facility];  Service: Cardiovascular;;  Rt Iliac   REMOVAL OF GRAFT Left 10/04/2020   Procedure: REMOVAL OF LEFT FEMORAL TO POPITEAL BYPASS GRAFT;  Surgeon: BSerafina Mitchell MD;  Location: [facility];  Service: Vascular;  Laterality: Left;    Family History  Problem Relation Age of Onset   Arthritis Mother    COPD Father    Stroke Father    Multiple sclerosis Sister     Social History:  Used to work in [employer] Was independent and walking with walker PTA.  He  reports that he has been smoking cigarettes. He has a 43.50 pack-year smoking history. He has never used smokeless tobacco. He reports that he does not drink alcohol and does not use drugs.  Allergies  Allergen Reactions   Aspirin Nausea And Vomiting   Garlic Other (See Comments)    sneezing    Medications Prior to Admission  Medication Sig Dispense Refill   acetaminophen (TYLENOL) 500 MG tablet Take 2 tablets (1,000 mg  total) by mouth every 6 (six) hours as needed for mild pain or headache. (Patient taking differently: Take 1,500 mg by mouth 3 (three) times daily as needed for moderate pain.) 100 tablet 0   atorvastatin (LIPITOR) 20 MG tablet Take 1 tablet (20 mg total) by mouth daily. 90 tablet 3   clopidogrel (PLAVIX) 75 MG tablet Take 1 tablet (75 mg total) by mouth daily. 90 tablet 3   gabapentin (NEURONTIN) 400 MG capsule Take one capsule by mouth in the morning, and two capsules by mouth at bedtime. 90 capsule 3   Ibuprofen-diphenhydrAMINE HCl (IBUPROFEN PM) 200-25 MG CAPS Take 3 tablets by mouth at bedtime as needed (sleep).     lactose free nutrition (BOOST) LIQD Take 237 mLs by mouth 2 (two) times daily between meals.     losartan (COZAAR) 25 MG tablet Take 1 tablet (25 mg total) by mouth daily. 90 tablet 1   OVER THE COUNTER MEDICATION Take 1 capsule by mouth daily. Stacker energy pill     phenylephrine (SUDAFED PE) 10 MG TABS tablet Take 10 mg by mouth every 4 (  four) hours as needed (allergies).     Sennosides (EX-LAX PO) Take 1 tablet by mouth daily as needed (constipation).     buPROPion (WELLBUTRIN XL) 150 MG 24 hr tablet TAKE 1 TABLET (150 MG TOTAL) BY MOUTH EVERY MORNING. (Patient not taking: No sig reported) 90 tablet 1   ceFAZolin (ANCEF) IVPB Inject 2 g into the vein every 8 (eight) hours. Indication:  Bypass graft infection First Dose: No Last Day of Therapy: 10/17/20 Labs - Once weekly:  CBC/D and BMP, Labs - Every other week:  ESR and CRP Method of administration: IV Push Method of administration may be changed at the discretion of home infusion pharmacist based upon assessment of the patient and/or caregiver's ability to self-administer the medication ordered. (Patient not taking: No sig reported) 126 Units 0   traMADol (ULTRAM) 50 MG tablet Take 1 tablet (50 mg total) by mouth every 12 (twelve) hours as needed. (Patient not taking: No sig reported) 10 tablet 0   Vitamin D, Ergocalciferol,  (DRISDOL) 1.25 MG (50000 UNIT) CAPS capsule Take 1 capsule (50,000 Units total) by mouth 2 (two) times a week. (Patient not taking: No sig reported) 24 capsule 0    Drug Regimen Review  Drug regimen was reviewed and remains appropriate with no significant issues identified  Home: Home Living Family/patient expects to be discharged to:: Private residence Living Arrangements: Other relatives, Other (Comment) Available Help at Discharge: Family, Available 24 hours/day Type of Home: House Home Access: Stairs to enter [employer] of Steps: 4 Entrance Stairs-Rails: Left, Right, Can reach both Home Layout: One level Bathroom Shower/Tub: Chiropodist: Standard Home Equipment: Environmental consultant - 2 wheels, Shower seat, Crutches, Wheelchair - manual, Grab bars - tub/shower   Functional History: Prior Function (Read Only) Level of Independence: Independent with assistive device(s) (Read Only) Comments: RW on stairs and to walk  Functional Status:  Mobility: Bed Mobility Overal bed mobility: Needs Assistance Bed Mobility: Supine to Sit Supine to sit: Supervision Sit to supine: Min guard General bed mobility comments: Up in chair upon PT Arrival. Transfers Overall transfer level: Needs assistance Equipment used: Rolling walker (2 wheeled) Transfers: Sit to/from Stand Sit to Stand: Min guard General transfer comment: Min A to power to standing from low chair with cues for hand placement; pt grunts during transition due to pain. Ambulation/Gait Ambulation/Gait assistance: Min guard Gait Distance (Feet): 100 Feet Assistive device: Rolling walker (2 wheeled) Gait Pattern/deviations:  ("hop to") General Gait Details: SLow, steady gait with heavy reliance on UEs on RW; a few instances of sharp shooting pains through left residual limb. Cues to extend LLE in standing. Gait velocity: decreased    ADL: ADL Overall ADL's : Needs assistance/impaired Eating/Feeding:  Independent, Sitting Grooming: Supervision/safety, Wash/dry hands, Wash/dry face, Standing Upper Body Bathing: Independent, Sitting Lower Body Bathing: Minimal assistance, Sitting/lateral leans Upper Body Dressing : Set up, Sitting Lower Body Dressing: Minimal assistance, Sitting/lateral leans, Sit to/from stand Toilet Transfer: Min guard, Ambulation, BSC, RW Toileting- Clothing Manipulation and Hygiene: Min guard, Cueing for safety, Cueing for sequencing, Sitting/lateral lean Functional mobility during ADLs: Min guard, Rolling walker General ADL Comments: Pt limited by pain and balance  Cognition: Cognition Overall Cognitive Status: Within Functional Limits for tasks assessed Orientation Level: Oriented X4 Cognition Arousal/Alertness: Awake/alert Behavior During Therapy: WFL for tasks assessed/performed Overall Cognitive Status: Within Functional Limits for tasks assessed   Blood pressure 119/70, pulse 79, temperature 98.2 F (36.8 C), temperature source Oral, resp. rate 19, height _0  (  1.702 m), weight 63.5 kg, SpO2 100 %. Physical Exam Vitals and nursing note reviewed. Exam conducted with a chaperone present.  Constitutional:      Appearance: Normal appearance.     Comments: Sitting at EOB with good balance. NAD. Seen in bedside chair- after eating lunch; nurse giving meds; NAD - thought lovenox could cause worse phantom pain- educated pt. Cachetic pt.   HENT:     Head: Normocephalic and atraumatic.     Right Ear: External ear normal.     Left Ear: External ear normal.     Nose: Nose normal. No congestion.     Mouth/Throat:     Mouth: Mucous membranes are dry.     Pharynx: Oropharynx is clear. No oropharyngeal exudate.  Eyes:     General:        Right eye: No discharge.        Left eye: No discharge.     Extraocular Movements: Extraocular movements intact.  Cardiovascular:     Rate and Rhythm: Regular rhythm. Tachycardia present.     Heart sounds: Normal heart sounds.  No murmur heard.   No gallop.  Pulmonary:     Comments: A little coarse on R side- cleared with coughing- otherwise, no W/R/R Abdominal:     General: Abdomen is flat. Bowel sounds are normal.     Palpations: Abdomen is soft.     Comments: Soft, NT, ND, (+)BS    Musculoskeletal:     Cervical back: Normal range of motion. No rigidity.     Comments: Hight L-AKA with dry dressing. MS: UE strength 5/5; RLE 5/5 LLE- didn't test due to high AKA except could lift against gravity- HF  Skin:    Comments: Pt didn't allow me to assess- since "just walked- is throbbing- asked for pain meds L groin incision assessed - healed by moist- no yeast seen   Neurological:     Mental Status: He is alert and oriented to person, place, and time.     Comments: Intact to light touch in UE and RLE-  Psychiatric:        Mood and Affect: Mood normal.        Behavior: Behavior normal.    No results found for this or any previous visit (from the past 48 hour(s)). No results found.     Medical Problem List and Plan: 1.  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  PAD/Antithrombotics: -DVT/anticoagulation:  Pharmaceutical: Lovenox  -antiplatelet therapy: Plavix 3. Pain Management: Oxycodone prn for severe pain and ultram prn for moderate pain 4. Mood: LCSW to follow for evaluation and support.   -antipsychotic agents: N/A 5. Neuropsych: This patient is capable of making decisions on his own behalf. 6. Skin/Wound Care: Monitor wound for healing.   --On Zinc, vitamin C, boost and prosource to promote healing.  7. Fluids/Electrolytes/Nutrition: Monitor I/O.  8.  GERD: On  Protonix 9. Acute blood loss anemia: Recheck CBC in am.  10 Tobacco abuse: Continue to encourage tobacco cessation.   --nicotine patch to prevent withdrawal.  11. Hyponatremia: Recheck CMET in am  12. Prediabetes: Hgb A1C- 6.0. Add CM  restrictions to diet as fasting BS 107-150 range  --Monitor CBGs daily in am.  13. Elevated A phos: Likely due to infection. Recheck LFTs in am.  14. HTN: Cozaar remains on hold.  --Continue Coreg bid  I have personally performed a face to face diagnostic evaluation of this patient and formulated the key components of the plan.  Additionally, I have personally reviewed laboratory data, imaging studies, as well as relevant notes and concur with the physician assistant's documentation above.   The patient's status has not changed from the original H&P.  Any changes in documentation from the acute care chart have been noted above.     Bary Leriche, PA-C 12/15/2020

## 2020-12-15 NOTE — Progress Notes (Signed)
Admission Note Patient arrived to the unit free of complications. Patient oriented to the the unit and assigned room. Assessments complete. Bed in lowest position, call bell within reach.

## 2020-12-15 NOTE — Progress Notes (Signed)
PIV consult: No intravenous medications ordered. Discussed with patient's nurse. Consult canceled, pt does not need IV at this time.

## 2020-12-15 NOTE — Progress Notes (Signed)
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 Date: 07/22/20     Deduct: $0      Out of Pocket Max: $0      Life Max: N/A CIR: 100%      SNF: 100% Outpatient: 27 visits     Co-Pay: $3 copay Home Health: 100%      Co-Pay: none DME: 100%     Co-Pay: none Providers: in network  SECONDARY:       Policy#:      Phone#:    Development worker, community:       Phone#:    The Engineer, petroleum" for patients in Inpatient Rehabilitation Facilities with attached "Privacy Act [place] Records" was provided and verbally reviewed with: N/A   Emergency Contact Information Contact Information       Name Relation Home Work Mobile    Benjamin Benjamin Sister [phone]   [phone]    Benjamin Benjamin Niece     [phone]           Current Medical History  Patient Admitting Diagnosis: Revision L AKA   History of Present Illness: Pt is 60 y/o M admitted 12/07/20 with necrotic left above-knee amputation site. 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  PT/OT evaluations completed with recommendations for acute inpatient rehab admission.   Patient's medical record from [facility] has been reviewed by the rehabilitation admission coordinator and physician.   Past  Medical History      Past Medical History:  Diagnosis Date   Back pain     Heavy smoker     Hyperlipidemia LDL goal <70     Hypertension     Peripheral arterial occlusive disease ([place]) 06/2013    Bilateral femoral artery disease      Has the patient had major surgery during 100 days prior to admission? Yes   Family History   family history includes Arthritis in his mother; COPD in his father; Multiple sclerosis in his sister; Stroke in his father.   Current Medications   Current Facility-Administered Medications:    acetaminophen (TYLENOL) tablet 650 mg, 650 mg, Oral, Q6H PRN **OR** acetaminophen (TYLENOL) suppository 650 mg, 650 mg, Rectal, Q6H PRN, Rhyne, Samantha J, PA-C   albuterol (PROVENTIL) (2.5 MG/3ML) 0.083% nebulizer solution 2.5 mg, 2.5 mg, Nebulization, Q6H PRN, Dahal, Binaya, MD   ascorbic acid (VITAMIN C) tablet 500 mg, 500 mg, Oral, Daily, Dahal, Binaya, MD, 500 mg at 12/15/20 1012   atorvastatin (LIPITOR) tablet 20 mg, 20 mg, Oral, Daily, Rhyne, Samantha J, PA-C, 20 mg at 12/15/20 1012   bisacodyl (DULCOLAX)  EC tablet 5 mg, 5 mg, Oral, Daily PRN, Rhyne, Samantha J, PA-C   carvedilol (COREG) tablet 6.25 mg, 6.25 mg, Oral, BID WC, Dahal, Binaya, MD, 6.25 mg at 12/15/20 7628   clopidogrel (PLAVIX) tablet 75 mg, 75 mg, Oral, Daily, Rhyne, Samantha J, PA-C, 75 mg at 12/15/20 1012   docusate sodium (COLACE) capsule 100 mg, 100 mg, Oral, Daily, Rhyne, Samantha J, PA-C, 100 mg at 12/15/20 1012   feeding supplement (ENSURE ENLIVE / ENSURE PLUS) liquid 237 mL, 237 mL, Oral, BID BM, Dahal, Binaya, MD, 237 mL at 12/15/20 1015   guaiFENesin-dextromethorphan (ROBITUSSIN DM) 100-10 MG/5ML syrup 15 mL, 15 mL, Oral, Q4H PRN, Rhyne, Samantha J, PA-C   heparin injection 5,000 Units, 5,000 Units, Subcutaneous, Q8H, Rhyne, Samantha J, PA-C, 5,000 Units at 12/15/20 0534   hydrALAZINE (APRESOLINE) injection 10 mg, 10 mg, Intravenous, Q6H PRN, Rhyne, Samantha J, PA-C   hydrOXYzine  (ATARAX/VISTARIL) tablet 25 mg, 25 mg, Oral, QHS PRN, Dahal, Marlowe Aschoff, MD, 25 mg at 12/14/20 2136   magnesium sulfate IVPB 2 g 50 mL, 2 g, Intravenous, Daily PRN, Rhyne, Samantha J, PA-C   methocarbamol (ROBAXIN) tablet 500 mg, 500 mg, Oral, Q6H PRN, Dahal, Binaya, MD, 500 mg at 12/15/20 1013   morphine 2 MG/ML injection 2 mg, 2 mg, Intravenous, Q2H PRN, Rhyne, Samantha J, PA-C, 2 mg at 12/15/20 0605   multivitamin with minerals tablet 1 tablet, 1 tablet, Oral, Daily, Dahal, Binaya, MD, 1 tablet at 12/15/20 1015   nicotine (NICODERM CQ - dosed in mg/24 hours) patch 21 mg, 21 mg, Transdermal, Daily, Rhyne, Samantha J, PA-C, 21 mg at 12/15/20 1016   ondansetron (ZOFRAN) injection 4 mg, 4 mg, Intravenous, Q6H PRN, Rhyne, Samantha J, PA-C   oxyCODONE-acetaminophen (PERCOCET/ROXICET) 5-325 MG per tablet 1-2 tablet, 1-2 tablet, Oral, Q4H PRN, Rhyne, Samantha J, PA-C, 2 tablet at 12/15/20 0535   pantoprazole (PROTONIX) EC tablet 40 mg, 40 mg, Oral, Daily, Rhyne, Samantha J, PA-C, 40 mg at 12/15/20 1012   phenol (CHLORASEPTIC) mouth spray 1 spray, 1 spray, Mouth/Throat, PRN, Rhyne, Samantha J, PA-C   potassium chloride SA (KLOR-CON) CR tablet 20-40 mEq, 20-40 mEq, Oral, Daily PRN, Rhyne, Samantha J, PA-C   senna (SENOKOT) tablet 8.6 mg, 1 tablet, Oral, BID, Rhyne, Samantha J, PA-C, 8.6 mg at 12/15/20 1012   traMADol (ULTRAM) tablet 50 mg, 50 mg, Oral, Q12H PRN, Rhyne, Samantha J, PA-C, 50 mg at 12/15/20 1012   zinc sulfate capsule 220 mg, 220 mg, Oral, Daily, Dahal, Binaya, MD, 220 mg at 12/15/20 1012   Patients Current Diet:  Diet Order                  Diet regular Room service appropriate? Yes; Fluid consistency: Thin  Diet effective now                         Precautions / Restrictions Precautions Precautions: Fall Precaution Comments: monitor for pain on L AK amp, HR Restrictions Weight Bearing Restrictions: Yes LLE Weight Bearing: Non weight bearing    Has the patient had 2 or more  falls or a fall with injury in the past year? Yes   Prior Activity Level Community (5-7x/wk): Went out about 4 X a week, worked PT   Prior Functional Level Self Care: Did the patient need help bathing, dressing, using the toilet or eating? Independent   Indoor Mobility: Did the patient need assistance with walking from room to room (with or  without device)? Independent   Stairs: Did the patient need assistance with internal or external stairs (with or without device)? Independent   Functional Cognition: Did the patient need help planning regular tasks such as shopping or remembering to take medications? Independent   Patient Information Are you of Hispanic, Latino/a,or Spanish origin?: A. No, not of Hispanic, Latino/a, or Spanish origin What is your race?: B. Black or African American Do you need or want an interpreter to communicate with a doctor or health care staff?: 0. No   Patient's Response To:  Health Literacy and Transportation Is the patient able to respond to health literacy and transportation needs?: Yes Health Literacy - How often do you need to have someone help you when you read instructions, pamphlets, or other written material from your doctor or pharmacy?: Rarely In the past 12 months, has lack of transportation kept you from medical appointments or from getting medications?: No In the past 12 months, has lack of transportation kept you from meetings, work, or from getting things needed for daily living?: No   Development worker, international aid / [place]: Environmental consultant - 2 wheels, Shower seat, Crutches, Wheelchair - manual, Grab bars - tub/shower   Prior Device Use: Indicate devices/aids used by the patient prior to current illness, exacerbation or injury? Manual wheelchair and Walker   Current Functional Level Cognition   Overall Cognitive Status: Within Functional Limits for tasks assessed Orientation Level: Oriented X4    Extremity Assessment (includes  Sensation/Coordination)   Upper Extremity Assessment: Overall WFL for tasks assessed  Lower Extremity Assessment: Defer to PT evaluation RLE Deficits / Details: minor weakness in hip     ADLs   Overall ADL's : Needs assistance/impaired Eating/Feeding: Independent, Sitting Grooming: Supervision/safety, Wash/dry hands, Wash/dry face, Standing Upper Body Bathing: Independent, Sitting Lower Body Bathing: Minimal assistance, Sitting/lateral leans Upper Body Dressing : Set up, Sitting Lower Body Dressing: Minimal assistance, Sitting/lateral leans, Sit to/from stand Toilet Transfer: Min guard, Ambulation, BSC, RW Toileting- Clothing Manipulation and Hygiene: Min guard, Cueing for safety, Cueing for sequencing, Sitting/lateral lean Functional mobility during ADLs: Min guard, Rolling walker General ADL Comments: Pt limited by pain and balance     Mobility   Overal bed mobility: Needs Assistance Bed Mobility: Supine to Sit Supine to sit: Supervision Sit to supine: Min guard General bed mobility comments: Up in chair upon PT Arrival.     Transfers   Overall transfer level: Needs assistance Equipment used: Rolling walker (2 wheeled) Transfers: Sit to/from Stand Sit to Stand: Min guard General transfer comment: Min A to power to standing from low chair with cues for hand placement; pt grunts during transition due to pain.     Ambulation / Gait / Stairs / Wheelchair Mobility   Ambulation/Gait Ambulation/Gait assistance: Counsellor (Feet): 100 Feet Assistive device: Rolling walker (2 wheeled) Gait Pattern/deviations:  ("hop to") General Gait Details: SLow, steady gait with heavy reliance on UEs on RW; a few instances of sharp shooting pains through left residual limb. Cues to extend LLE in standing. Gait velocity: decreased     Posture / Balance Dynamic Sitting Balance Sitting balance - Comments: Pt leans to decrease WB into LLE Balance Overall balance assessment: Needs  assistance Sitting-balance support: Feet supported, No upper extremity supported Sitting balance-Leahy Scale: Good Sitting balance - Comments: Pt leans to decrease WB into LLE Standing balance support: During functional activity Standing balance-Leahy Scale: Poor Standing balance comment: Requires Ue support in standing  Special needs/care consideration Skin Left AKA incisional site revision with dressing and ace wrap    Previous Home Environment (from acute therapy documentation) Living Arrangements: Other relatives, Other (Comment) Available Help at Discharge: Family, Available 24 hours/day Type of Home: House Home Layout: One level Home Access: Stairs to enter Entrance Stairs-Rails: Left, Right, Can reach both Entrance Stairs-Number of Steps: 4 Bathroom Shower/Tub: Chiropodist: Standard   Discharge Living Setting Plans for Discharge Living Setting: House, Lives with (comment) (Lives with sister and her family.) Type of Home at Discharge: House Discharge Home Layout: One level Discharge Home Access: Ramped entrance Discharge Bathroom Shower/Tub: Tub/shower unit, Curtain Discharge Bathroom Toilet: Standard Discharge Bathroom Accessibility: Yes How Accessible: Accessible via walker (Can get in sideways with a walker)   Social/Family/Support Systems Patient Roles: Other (Comment) (Has sister and her family) Contact Information: Benjamin Benjamin - sister Anticipated Caregiver: Sister Anticipated Caregiver's Contact Information: Benjamin Benjamin - sister - [phone] Ability/Limitations of Caregiver: Sister his MS and uses a walker, can provide supervision Caregiver Availability: 24/7 Discharge Plan Discussed with Primary Caregiver: Yes Is Caregiver In Agreement with Plan?: Yes Does Caregiver/Family have Issues with Lodging/Transportation while Pt is in Rehab?: No   Goals Patient/Family Goal for Rehab: PT/OT supervision goals Expected length of stay: 10-14  days Cultural Considerations: None Pt/Family Agrees to Admission and willing to participate: Yes Program Orientation Provided & Reviewed with Pt/Caregiver Including Roles  & Responsibilities: Yes   Decrease burden of Care through IP rehab admission: N/A   Possible need for SNF placement upon discharge: Not anticipated   Patient Condition: I have reviewed medical records from [facility], spoken with CM, and patient. I met with patient at the bedside for inpatient rehabilitation assessment.  Patient will benefit from ongoing PT and OT, can actively participate in 3 hours of therapy a day 5 days of the week, and can make measurable gains during the admission.  Patient will also benefit from the coordinated team approach during an Inpatient Acute Rehabilitation admission.  The patient will receive intensive therapy as well as Rehabilitation physician, nursing, social worker, and care management interventions.  Due to bladder management, bowel management, safety, skin/wound care, disease management, medication administration, pain management, and patient education the patient requires 24 hour a day rehabilitation nursing.  The patient is currently min to minguard assist with mobility and basic ADLs.  Discharge setting and therapy post discharge at home with home health is anticipated.  Patient has agreed to participate in the Acute Inpatient Rehabilitation Program and will admit today.   Preadmission Screen Completed By:  Retta Diones, 12/15/2020 10:48 AM ______________________________________________________________________   Discussed status with Dr. Dagoberto Ligas on 12/15/20  at 10:48 AM  and received approval for admission today.   Admission Coordinator:  Michel Santee, PT, time 10:48 AM Sudie Grumbling 12/15/20     Assessment/Plan: Diagnosis: Does the need for close, 24 hr/day Medical supervision in concert with the patient's rehab needs make it unreasonable for this patient to be served in a less intensive  setting? Yes Co-Morbidities requiring supervision/potential complications: L AKA revision due to necrosis; heavy smoker; HTN, PAD, s/p B/L femoral bypasses; HLD,  Due to bladder management, bowel management, safety, skin/wound care, disease management, medication administration, pain management, and patient education, does the patient require 24 hr/day rehab nursing? Yes Does the patient require coordinated care of a physician, rehab nurse, PT, OT, and SLP to address physical and functional deficits in the context of the above medical diagnosis(es)? Yes  Addressing deficits in the following areas: balance, endurance, locomotion, strength, transferring, bowel/bladder control, bathing, dressing, feeding, grooming, and toileting Can the patient actively participate in an intensive therapy program of at least 3 hrs of therapy 5 days a week? Yes The potential for patient to make measurable gains while on inpatient rehab is good Anticipated functional outcomes upon discharge from inpatient rehab: supervision PT, supervision OT, n/a SLP Estimated rehab length of stay to reach the above functional goals is: 10-14 days Anticipated discharge destination: Home 10. Overall Rehab/Functional Prognosis: good     MD Signature:

## 2020-12-15 NOTE — Progress Notes (Signed)
Inpatient Rehab Admissions Coordinator:    I have insurance approval and a bed available for pt to admit to CIR today. Dr. Pola Corn in agreement.  Will let pt/family and TOC team know.   Estill Dooms, PT, DPT Admissions Coordinator [phone] 12/15/20  10:42 AM

## 2020-12-15 NOTE — Plan of Care (Signed)
  Problem: Nutrition: Goal: Adequate nutrition will be maintained Outcome: Progressing   Problem: Pain Managment: Goal: General experience of comfort will improve Outcome: Progressing   

## 2020-12-15 NOTE — Discharge Summary (Signed)
Physician Discharge Summary  [name] MVE:[id number] DOB: [birth year] DOA: 12/07/2020  PCP: Arnette Felts, FNP  Admit date: 12/07/2020 Discharge date: 12/15/2020  Admitted From: Home Discharge disposition: CIR   Code Status: Full Code   Discharge Diagnosis:   Active Problems:   Wound cellulitis   Protein-calorie malnutrition, severe    Chief Complaint  Patient presents with   Wound Infection   Brief narrative: Benjamin [name] is a 60 y.o. male with PMH significant for HTN, HLD, heavy smoking, peripheral artery disease s/p bilateral femoral artery bypass graft who underwent left above-knee amputation, removal of infected left femoral-popliteal bypass graft, placement of antibiotic beads by vascular surgery on 10/04/2020 and was discharged to home with p.o. Bactrim for 6 weeks, oxycodone for pain as well as wound VAC and home health services.   Patient presented to the ED from home today with significant drainage from the amputation stump with possible bone exposure.  He has the symptoms ongoing for last several days.  He was seen earlier in the month by vascular surgeon Dr. Randie Heinz with ongoing discussion about additional surgery, had amputation and further courses of antibiotics.  Patient was reluctant to come to the hospital despite multiple requests by family and home health RN.   In the ED, patient was afebrile, heart rate 120s, blood pressure 170/108, respiratory rate 22 on room air 99% O2 sat Labs with WBC 10.7, hemoglobin 9.3, platelets 621, BUN/creatinine 11/0.64, albumin 2.6, lactic acid 1.2 Left femur x-ray showed above-knee amputation status on the left without evidence of osteomyelitis but possible soft tissue infection. Vascular surgery consultation was obtained.  Hospitalist service consulted for inpatient admission and management. See below for details  Subjective: Patient was seen and examined this morning.   Sitting up in bed.  Not in distress.  Hemodynamically  stable.  Insurance approval obtained for CIR.    Hospital course: Infected left AKA amputation stump -Admitted for infected stump site. -On 10/18, patient underwent revision of left above-knee amputation. Purulent material was sent for culture.  Grew Enterobacter and MDR Klebsiella.  Completed course of IV Zosyn. -Remains afebrile, WBC count normal. Recent Labs  Lab 12/09/20 0254 12/10/20 0226 12/11/20 0156 12/12/20 0103 12/13/20 0047  WBC 13.0* 11.6* 8.7 7.5 7.3    Peripheral artery disease Hyperlipidemia -Continue Plavix, Lipitor -Tramadol for pain.   Essential hypertension -Blood pressure and heart rate improved after Coreg dose was increased to 6.25 mg twice daily   Heavy smoking -1 pack/day smoker.  Nicotine patch offered.  Counseled to quit.   Allergies as of 12/15/2020       Reactions   Aspirin Nausea And Vomiting   Garlic Other (See Comments)   sneezing        Medication List     STOP taking these medications    buPROPion 150 MG 24 hr tablet Commonly known as: WELLBUTRIN XL   ceFAZolin  IVPB Commonly known as: ANCEF   losartan 25 MG tablet Commonly known as: Cozaar   OVER THE COUNTER MEDICATION   traMADol 50 MG tablet Commonly known as: Ultram       TAKE these medications    acetaminophen 500 MG tablet Commonly known as: TYLENOL Take 2 tablets (1,000 mg total) by mouth every 6 (six) hours as needed for mild pain or headache. What changed:  how much to take when to take this reasons to take this   atorvastatin 20 MG tablet Commonly known as: Lipitor Take 1 tablet (20 mg total)  by mouth daily.   bisacodyl 5 MG EC tablet Commonly known as: DULCOLAX Take 1 tablet (5 mg total) by mouth daily as needed for moderate constipation.   carvedilol 6.25 MG tablet Commonly known as: COREG Take 1 tablet (6.25 mg total) by mouth 2 (two) times daily with a meal.   clopidogrel 75 MG tablet Commonly known as: PLAVIX Take 1 tablet (75 mg total)  by mouth daily.   docusate sodium 100 MG capsule Commonly known as: COLACE Take 1 capsule (100 mg total) by mouth daily. Start taking on: December 16, 2020   EX-LAX PO Take 1 tablet by mouth daily as needed (constipation).   gabapentin 400 MG capsule Commonly known as: Neurontin Take one capsule by mouth in the morning, and two capsules by mouth at bedtime.   guaiFENesin-dextromethorphan 100-10 MG/5ML syrup Commonly known as: ROBITUSSIN DM Take 15 mLs by mouth every 4 (four) hours as needed for cough.   Ibuprofen PM 200-25 MG Caps Generic drug: Ibuprofen-diphenhydrAMINE HCl Take 3 tablets by mouth at bedtime as needed (sleep).   lactose free nutrition Liqd Take 237 mLs by mouth 2 (two) times daily between meals.   methocarbamol 500 MG tablet Commonly known as: ROBAXIN Take 1 tablet (500 mg total) by mouth every 6 (six) hours as needed for muscle spasms.   multivitamin with minerals Tabs tablet Take 1 tablet by mouth daily. Start taking on: December 16, 2020   oxyCODONE-acetaminophen 5-325 MG tablet Commonly known as: PERCOCET/ROXICET Take 1-2 tablets by mouth every 4 (four) hours as needed for moderate pain.   phenylephrine 10 MG Tabs tablet Commonly known as: SUDAFED PE Take 10 mg by mouth every 4 (four) hours as needed (allergies).   Vitamin D (Ergocalciferol) 1.25 MG (50000 UNIT) Caps capsule Commonly known as: DRISDOL Take 1 capsule (50,000 Units total) by mouth 2 (two) times a week.               Discharge Care Instructions  (From admission, onward)           Start     Ordered   12/15/20 0000  No dressing needed        12/15/20 1105            Discharge Instructions:  Diet Recommendation:    @BRDDSCINSTRUCTIONS @  Follow ups:    Follow-up Information     [facility] - Follow up in 4 week(s).   Specialty: Vascular Surgery Why: Office will call you to arrange your appt (sent) Contact information: [address] [place] [phone]        [facility]              Follow up.   Why: 12/21/20 at 0730 am Contact information: [address] [place] [address]        [phone], FNP Follow up.   Specialty: General Practice Contact information: [address] [place] [phone]                 Wound care:   Incision (Closed) 11/13/19 Groin Right (Active)  Date First Assessed/Time First Assessed: 11/13/19 1604   Location: Groin  Location Orientation: Right    Assessments 11/13/2019  6:20 PM 11/17/2019  8:23 AM  Dressing Type Liquid skin adhesive None  Dressing Clean;Dry;Intact Intact;Dry;Clean  Site / Wound Assessment Clean;Dry Clean;Dry  Margins Attached edges (approximated) Attached edges (approximated)  Closure Skin glue Skin glue;Approximated  Drainage Amount None None  Treatment -- Cleansed     No Linked orders to display     Incision (Closed) 05/08/20 Abdomen Other (Comment) (Active)  Date First Assessed/Time First Assessed: 05/08/20 0932   Location: Abdomen  Location Orientation: Other (Comment)    Assessments 05/08/2020  2:20 PM 05/16/2020  7:58 AM  Dressing Type Liquid skin adhesive Liquid skin adhesive  Dressing Clean;Dry;Intact Clean;Dry;Intact  Site / Wound Assessment -- Clean;Dry  Margins Attached edges (approximated) Attached edges (approximated)  Closure -- Approximated;Skin glue  Drainage Amount None None     No Linked orders to display     Incision (Closed) 05/08/20 Groin Right (Active)  Date First Assessed/Time First Assessed: 05/08/20 1036   Location: Groin  Location Orientation: Right    Assessments 05/08/2020  2:20 PM 05/16/2020  7:58 AM  Dressing Type Liquid skin adhesive Gauze (Comment)  Dressing Clean;Dry;Intact Clean;Dry;Intact  Dressing Change Frequency -- PRN  Site / Wound Assessment -- Dressing in  place / Unable to assess  Margins Attached edges (approximated) Attached edges (approximated)  Closure -- Approximated;Skin glue  Drainage Amount None None  Drainage Description -- Serosanguineous  Treatment -- Cleansed     No Linked orders to display     Incision (Closed) 05/08/20 Leg Left (Active)  Date First Assessed/Time First Assessed: 05/08/20 1405   Location: Leg  Location Orientation: Left    Assessments 05/08/2020  2:20 PM 05/16/2020  7:58 AM  Dressing Type Liquid skin adhesive Liquid skin adhesive  Dressing Clean;Dry;Intact Clean;Intact  Site / Wound Assessment -- Clean;Dry  Margins Attached edges (approximated) Attached edges (approximated)  Closure -- Approximated;Skin glue  Drainage Amount None None     No Linked orders to display     Incision (Closed) 10/04/20 Thigh Left (Active)  Date First Assessed/Time First Assessed: 10/04/20 1258   Location: Thigh  Location Orientation: Left    Assessments 10/04/2020  1:25 PM 10/15/2020  8:00 AM  Dressing Type Compression wrap;Gauze (Comment) --  Dressing Clean;Dry;Intact Clean;Dry  Site / Wound Assessment Dressing in place / Unable to assess --  Margins -- Attached edges (approximated)  Closure -- Staples  Drainage Amount None None     No Linked orders to display     Incision (Closed) 10/04/20 Groin Left (Active)  Date First Assessed/Time First Assessed: 10/04/20 1258   Location: Groin  Location Orientation: Left    Assessments 10/04/2020  1:25 PM 10/15/2020 11:00 PM  Dressing Type Gauze (Comment);Tape dressing Negative pressure wound therapy  Dressing Clean;Dry;Intact --  Site / Wound Assessment Dressing in place / Unable to assess --  Drainage Amount None --     No Linked orders to display     Negative Pressure Wound Therapy Groin Left (Active)  Placement Date: 10/13/20   Location: Groin  Location Orientation: Left    Assessments 10/13/2020 11:00 AM 10/16/2020  8:30 AM  Last dressing change 10/13/20 --  Site / Wound  Assessment Red;Yellow --  Wound filler - Black foam 1 --  Cycle Continuous Continuous;On  Target Pressure (mmHg) -- 125  Canister Changed Yes --  Machine plugged into wall outlet (NOT bed outlet) Yes --  Dressing Status Intact --  Drainage Amount Scant --  Drainage Description Serosanguineous --     No Linked orders to display     Incision (Closed) 12/08/20 Thigh Left (Active)  Date First Assessed/Time First Assessed: 12/08/20 1158   Location: Thigh  Location Orientation: Left  Assessments 12/08/2020 12:20 PM 12/15/2020  6:00 AM  Dressing Type Compression wrap Gauze (Comment);Abdominal pads  Dressing Clean;Dry;Intact Changed  Site / Wound Assessment Dressing in place / Unable to assess Painful;Red  Drainage Amount None --  Drainage Description -- Sanguineous  Treatment -- Cleansed     No Linked orders to display    Discharge Exam:   Vitals:   12/14/20 1659 12/14/20 2032 12/15/20 0504 12/15/20 0745  BP: 121/87 (!) 128/93 (!) 141/85 119/70  Pulse: 97 98 80 79  Resp: 19     Temp: 97.6 F (36.4 C) 98.3 F (36.8 C) 98.3 F (36.8 C) 98.2 F (36.8 C)  TempSrc: Oral Oral Oral Oral  SpO2: 100% (!) 76% 97% 100%  Weight:      Height:        Body mass index is 21.93 kg/m.  General exam: Pleasant middle-aged African-American male.  Not in distress. Pain controlled. skin: No rashes, lesions or ulcers. HEENT: Atraumatic, normocephalic, no obvious bleeding Lungs: Clear to auscultation bilaterally CVS: Regular rate and rhythm, no murmur GI/Abd soft, nontender, nondistended, bowel sound present CNS: Alert, awake, oriented x3 Psychiatry: Mood appropriate Extremities: Right leg with trace edema, left leg amputation stump with bandage on.  Time coordinating discharge: 35 minutes   The results of significant diagnostics from this hospitalization (including imaging, microbiology, ancillary and laboratory) are listed below for reference.    Procedures and Diagnostic Studies:    DG Chest 2 View  Result Date: 12/07/2020 CLINICAL DATA:  Suspected Sepsis EXAM: CHEST - 2 VIEW COMPARISON:  Radiograph 05/09/2020 FINDINGS: Unchanged cardiomediastinal silhouette. There is no focal airspace consolidation. There is no large pleural effusion or visible pneumothorax. There is no acute osseous abnormality. Thoracic spondylosis. IMPRESSION: No focal airspace consolidation. Electronically Signed   By: Caprice Renshaw M.D.   On: 12/07/2020 15:49   DG Femur Min 2 Views Left  Result Date: 12/07/2020 CLINICAL DATA:  Patient complains of distal left femur wound infection post amputation 3 months ago. Suspected sepsis. EXAM: LEFT FEMUR 2 VIEWS COMPARISON:  CT runoff study 10/01/2020. FINDINGS: Interval above the knee amputation through the distal left femur. The amputation margins are sharp, without bone destruction to suggest osteomyelitis. There is localized gas within the soft tissues of the distal stump. Scattered vascular calcifications and vascular clips are noted. No unexpected foreign body. IMPRESSION: Status post above the knee amputation without radiographic evidence of osteomyelitis. Nonspecific distal soft tissue emphysema which could be contained within the wound or indicate soft tissue infection. Electronically Signed   By: Carey Bullocks M.D.   On: 12/07/2020 15:51     Labs:   Basic Metabolic Panel: Recent Labs  Lab 12/09/20 0254 12/10/20 0226 12/11/20 0156 12/12/20 0103 12/13/20 0047  NA 126* 136 133* 134* 133*  K 3.7 3.9 4.0 3.9 4.0  CL 96* 99 98 101 99  CO2 GLUCOSE 109* 124* 150* 107* 114*  BUN 8 7* CREATININE 0.67 0.79 0.71 0.71 0.80  CALCIUM 7.9* 8.7* 8.4* 8.3* 8.5*   GFR Estimated Creatinine Clearance: 87.1 mL/min (by C-G formula based on SCr of 0.8 mg/dL). Liver Function Tests: No results for input(s): AST, ALT, ALKPHOS, BILITOT, PROT, ALBUMIN in the last 168 hours. No results for input(s): LIPASE, AMYLASE in the last 168  hours. No results for input(s): AMMONIA in the last 168 hours. Coagulation profile No results for input(s): INR, PROTIME in the last 168 hours.  CBC: Recent Labs  Lab 12/09/20 0254 12/10/20 0226 12/11/20 0156 12/12/20 0103 12/13/20 0047  WBC 13.0* 11.6* 8.7 7.5 7.3  NEUTROABS  --   --  6.5 6.2 4.9  HGB 7.4* 7.7* 7.6* 7.3* 7.8*  HCT 21.7* 23.5* 22.9* 22.4* 23.5*  MCV 74.3* 74.6* 75.1* 74.9* 74.6*  PLT 502* 578* 565* 571* 597*   Cardiac Enzymes: No results for input(s): CKTOTAL, CKMB, CKMBINDEX, TROPONINI in the last 168 hours. BNP: Invalid input(s): POCBNP CBG: No results for input(s): GLUCAP in the last 168 hours. D-Dimer No results for input(s): DDIMER in the last 72 hours. Hgb A1c No results for input(s): HGBA1C in the last 72 hours. Lipid Profile No results for input(s): CHOL, HDL, LDLCALC, TRIG, CHOLHDL, LDLDIRECT in the last 72 hours. Thyroid function studies No results for input(s): TSH, T4TOTAL, T3FREE, THYROIDAB in the last 72 hours.  Invalid input(s): FREET3 Anemia work up No results for input(s): VITAMINB12, FOLATE, FERRITIN, TIBC, IRON, RETICCTPCT in the last 72 hours. Microbiology Recent Results (from the past 240 hour(s))  Culture, blood (Routine x 2)     Status: None   Collection Time: 12/07/20  2:41 PM   Specimen: BLOOD RIGHT FOREARM  Result Value Ref Range Status   Specimen Description BLOOD RIGHT FOREARM  Final   Special Requests   Final    BOTTLES DRAWN AEROBIC AND ANAEROBIC Blood Culture adequate volume   Culture   Final    NO GROWTH 5 DAYS Performed at [facility], [address] [place], [place] [address]    Report Status 12/12/2020 FINAL  Final  Resp Panel by RT-PCR (Flu A&B, Covid) Nasopharyngeal Swab     Status: None   Collection Time: 12/07/20  2:59 PM   Specimen: Nasopharyngeal Swab; Nasopharyngeal(NP) swabs in vial transport medium  Result Value Ref Range Status   SARS Coronavirus 2 by RT PCR NEGATIVE NEGATIVE Final     Comment: (NOTE) SARS-CoV-2 target nucleic acids are NOT DETECTED.  The SARS-CoV-2 RNA is generally detectable in upper respiratory specimens during the acute phase of infection. The lowest concentration of SARS-CoV-2 viral copies this assay can detect is 138 copies/mL. A negative result does not preclude SARS-Cov-2 infection and should not be used as the sole basis for treatment or other patient management decisions. A negative result may occur with  improper specimen collection/handling, submission of specimen other than nasopharyngeal swab, presence of viral mutation(s) within the areas targeted by this assay, and inadequate number of viral copies(<138 copies/mL). A negative result must be combined with clinical observations, patient history, and epidemiological information. The expected result is Negative.  Fact Sheet for Patients:  BloggerCourse.com  Fact Sheet for Healthcare Providers:  SeriousBroker.it  This test is no t yet approved or cleared by the [place] FDA and  has been authorized for detection and/or diagnosis of SARS-CoV-2 by FDA under an Emergency Use Authorization (EUA). This EUA will remain  in effect (meaning this test can be used) for the duration of the COVID-19 declaration under Section 564(b)(1) of the Act, 21 U.S.C.section 360bbb-3(b)(1), unless the authorization is terminated  or revoked sooner.       Influenza A by PCR NEGATIVE NEGATIVE Final   Influenza B by PCR NEGATIVE NEGATIVE Final    Comment: (NOTE) The Xpert Xpress SARS-CoV-2/FLU/RSV plus assay is intended as an aid in the diagnosis of influenza from Nasopharyngeal swab specimens and should not be used as a sole basis for treatment. Nasal washings and aspirates are unacceptable for Xpert Xpress SARS-CoV-2/FLU/RSV testing.  Fact Sheet for Patients: BloggerCourse.com  Fact Sheet for Healthcare  Providers: SeriousBroker.it  This test is not yet approved or cleared by the [place] FDA and has been authorized for detection and/or diagnosis of SARS-CoV-2 by FDA under an Emergency Use Authorization (EUA). This EUA will remain in effect (meaning this test can be used) for the duration of the COVID-19 declaration under Section 564(b)(1) of the Act, 21 U.S.C. section 360bbb-3(b)(1), unless the authorization is terminated or revoked.  Performed at [facility], [address] [place], [place] [address]   Culture, blood (Routine x 2)     Status: None   Collection Time: 12/07/20  3:25 PM   Specimen: BLOOD RIGHT ARM  Result Value Ref Range Status   Specimen Description BLOOD RIGHT ARM  Final   Special Requests   Final    BOTTLES DRAWN AEROBIC AND ANAEROBIC Blood Culture adequate volume   Culture   Final    NO GROWTH 5 DAYS Performed at [facility], [address], [place], [place] [address]    Report Status 12/12/2020 FINAL  Final  Aerobic/Anaerobic Culture w Gram Stain (surgical/deep wound)     Status: None   Collection Time: 12/08/20 11:09 AM   Specimen: Soft Tissue, Other  Result Value Ref Range Status   Specimen Description WOUND LEFT KNEE  Final   Special Requests PATIENT ON FOLLOWING ZINACEF  Final   Gram Stain   Final    ABUNDANT WBC PRESENT,BOTH PMN AND MONONUCLEAR MODERATE GRAM POSITIVE COCCI FEW GRAM NEGATIVE RODS    Culture   Final    MODERATE ENTEROBACTER CLOACAE FEW KLEBSIELLA PNEUMONIAE Confirmed Extended Spectrum Beta-Lactamase Producer (ESBL).  In bloodstream infections from ESBL organisms, carbapenems are preferred over piperacillin/tazobactam. They are shown to have a lower risk of mortality. FEW ENTEROCOCCUS FAECALIS NO ANAEROBES ISOLATED Performed at [facility], [address] [place], [place] [address]    Report Status 12/13/2020 FINAL  Final   Organism ID, Bacteria ENTEROBACTER CLOACAE  Final    Organism ID, Bacteria KLEBSIELLA PNEUMONIAE  Final   Organism ID, Bacteria ENTEROCOCCUS FAECALIS  Final      Susceptibility   Enterobacter cloacae - MIC*    CEFAZOLIN >=64 RESISTANT Resistant     CEFEPIME <=0.12 SENSITIVE Sensitive     CEFTAZIDIME <=1 SENSITIVE Sensitive     CIPROFLOXACIN <=0.25 SENSITIVE Sensitive     GENTAMICIN <=1 SENSITIVE Sensitive     IMIPENEM <=0.25 SENSITIVE Sensitive     TRIMETH/SULFA >=320 RESISTANT Resistant     PIP/TAZO 8 SENSITIVE Sensitive     * MODERATE ENTEROBACTER CLOACAE   Enterococcus faecalis - MIC*    AMPICILLIN <=2 SENSITIVE Sensitive     VANCOMYCIN 1 SENSITIVE Sensitive     GENTAMICIN SYNERGY RESISTANT Resistant     * FEW ENTEROCOCCUS FAECALIS   Klebsiella pneumoniae - MIC*    AMPICILLIN >=32 RESISTANT Resistant     CEFAZOLIN >=64 RESISTANT Resistant     CEFEPIME >=32 RESISTANT Resistant     CEFTAZIDIME RESISTANT Resistant     CEFTRIAXONE >=64 RESISTANT Resistant     CIPROFLOXACIN 0.5 INTERMEDIATE Intermediate     GENTAMICIN <=1 SENSITIVE Sensitive     IMIPENEM <=0.25 SENSITIVE Sensitive     TRIMETH/SULFA >=320 RESISTANT Resistant     AMPICILLIN/SULBACTAM >=32 RESISTANT Resistant     PIP/TAZO <=4 SENSITIVE Sensitive     * FEW KLEBSIELLA PNEUMONIAE     Signed: Cage Gupton  [facility] 12/15/2020, 11:07 AM

## 2020-12-15 NOTE — H&P (Signed)
Physical Medicine and Rehabilitation Admission H&P        Chief Complaint  Patient presents with   Functional deficits due to L AKA        HPI:  Benjamin [name] is a 60 year old male with history of PAD s/p multiple BLE bypass grafts, ongoing heavy tobacco abuse, L-AKA, removal of infected femoral-popliteal graft 10/04/20 with wound dehiscence,bone exposure,drainage and cellulitis. He was started on IV Zosyn and cultures grew out Enterococcus, Entrobacter and Klebsiella. He underwent wound revision of L-AKA on 10/18 by Dr. Scot Dock and post op tachycardia treated with low dose coreg.  He completed course of antibiotics on 10/23 and leucocytosis has resolve. Anemia being monitored with H/H stable at 7.8 and Na levels continue to fluctuate. Therapy ongoing and patient limited by pain, weakness and fatigue. CIR recommended due to functional decline.    Pt reports that he just had dressing placed before walking with PT- asked me not to assess L AKA- per nursing C/D/I. Staples intact.  Hard to lay in bed- it's painful- likes bedside chair better.  Phantom pain esp at night.  LBM today- peeing well.      Review of Systems  Constitutional:  Negative for chills and fever.  HENT:  Positive for hearing loss (mild). Negative for tinnitus.   Eyes:  Negative for blurred vision and double vision.  Respiratory:  Negative for cough and shortness of breath.   Cardiovascular:  Negative for chest pain and palpitations.  Gastrointestinal:  Positive for constipation. Negative for heartburn and nausea.  Genitourinary:  Negative for dysuria and urgency.  Musculoskeletal:  Negative for back pain, joint pain and myalgias.  Skin:  Negative for rash.  Neurological:  Positive for dizziness. Negative for focal weakness, weakness and headaches.  Psychiatric/Behavioral:  The patient is not nervous/anxious and does not have insomnia.   All other systems reviewed and are negative.         Past Medical History:   Diagnosis Date   Back pain     Heavy smoker     Hyperlipidemia LDL goal <70     Hypertension     Peripheral arterial occlusive disease ([place]) 06/2013    Bilateral femoral artery disease           Past Surgical History:  Procedure Laterality Date   ABDOMINAL AORTOGRAM W/LOWER EXTREMITY N/A 07/12/2016    Procedure: Abdominal Aortogram w/Lower Extremity;  Surgeon: Serafina Mitchell, MD;  Location: [facility];  Service: Cardiovascular;  Laterality: N/A;  Bilateral extermity: Patent Renal As. No sig Dz in infrarenal Abd Aorta. Normal Bilat Iliac arteries. R SFA is 100% @ origin - recon in AK-Pop A. R PT A patent. L CFA occluded. L PFA recon @ origin, L SFA occluded w/ recon in AK Pop A.   ABDOMINAL AORTOGRAM W/LOWER EXTREMITY N/A 10/15/2019    Procedure: ABDOMINAL AORTOGRAM W/LOWER EXTREMITY;  Surgeon: Serafina Mitchell, MD;  Location: [facility];  Service: Cardiovascular;  Laterality: N/A;   AMPUTATION Left 10/04/2020    Procedure: LEFT ABOVE KNEE AMPUTATION;  Surgeon: Serafina Mitchell, MD;  Location: [facility];  Service: Vascular;  Laterality: Left;   AMPUTATION Left 12/08/2020    Procedure: RIGHT ABOVE KNEE AMPUTATION REVISION;  Surgeon: Angelia Mould, MD;  Location: [facility];  Service: Vascular;  Laterality: Left;   AORTA - BILATERAL FEMORAL ARTERY BYPASS GRAFT N/A 05/08/2020    Procedure: AORTA BIFEMORAL BYPASS GRAFT;  Surgeon: Serafina Mitchell,  MD;  Location: [facility];  Service: Vascular;  Laterality: N/A;   COLONOSCOPY   06/2016    never   ENDARTERECTOMY FEMORAL Left 11/14/2019    Procedure: Left groin exploration, Redo left femoral artery exposure;  Surgeon: Serafina Mitchell, MD;  [facility]: [facility];  Service: Vascular;  Laterality: Left;   FEMORAL-FEMORAL BYPASS GRAFT N/A 11/13/2019    Procedure: BYPASS GRAFT FEMORAL-FEMORAL ARTERY RIGHT TO LEFT USING HEMASHIELD GOLD GRAFT 45m x 30cm;  Surgeon: BSerafina Mitchell MD;  [facility];  Service: Vascular;  Laterality: N/A;    FEMORAL-POPLITEAL BYPASS GRAFT Left 11/13/2019    Procedure: LEFT FEMORAL BELOW KNEE-POPLITEAL ARTERY USING NON-REVERSED GREATER SAPHENOUS VEIN;  Surgeon: BSerafina Mitchell MD;  [facility];  Service: Vascular;  Laterality: Left;   FEMORAL-POPLITEAL BYPASS GRAFT Left 05/08/2020    Procedure: LEFT REDO BYPASS GRAFT COMMON FEMORAL- BELOW KNEE POPLITEAL ARTERY USING PROPATEN GRAFT;  Surgeon: BSerafina Mitchell MD;  [facility];  Service: Vascular;  Laterality: Left;   I & D EXTREMITY Left 09/07/2020    Procedure: IRRIGATION AND DEBRIDEMENT LEFT LEG WITH EXCISION OF LEFT LEG DISTAL BYPASS GRAFT;  Surgeon: CMarty Heck MD;  [facility]  Service: Vascular;  Laterality: Left;   LOWER EXTREMITY ANGIOGRAM Left 11/14/2019    Procedure: LEFT LOWER EXTREMITY ANGIOGRAM, BYPASS GRAFT ANGIOPLASTY;  Surgeon: BSerafina Mitchell MD;  [facility];  Service: Vascular;  Laterality: Left;   PERIPHERAL VASCULAR INTERVENTION   10/15/2019    Procedure: PERIPHERAL VASCULAR INTERVENTION;  Surgeon: BSerafina Mitchell MD;  [facility];  Service: Cardiovascular;;  Rt Iliac   REMOVAL OF GRAFT Left 10/04/2020    Procedure: REMOVAL OF LEFT FEMORAL TO POPITEAL BYPASS GRAFT;  Surgeon: BSerafina Mitchell MD;  [facility];  Service: Vascular;  Laterality: Left;           Family History  Problem Relation Age of Onset   Arthritis Mother     COPD Father     Stroke Father     Multiple sclerosis Sister        Social History:  Used to work in [employer] Was independent and walking with walker PTA.  He  reports that he has been smoking cigarettes. He has a 43.50 pack-year smoking history. He has never used smokeless tobacco. He reports that he does not drink alcohol and does not use drugs.        Allergies  Allergen Reactions   Aspirin Nausea And Vomiting   Garlic Other (See Comments)      sneezing            Medications Prior to Admission  Medication Sig  Dispense Refill   acetaminophen (TYLENOL) 500 MG tablet Take 2 tablets (1,000 mg total) by mouth every 6 (six) hours as needed for mild pain or headache. (Patient taking differently: Take 1,500 mg by mouth 3 (three) times daily as needed for moderate pain.) 100 tablet 0   atorvastatin (LIPITOR) 20 MG tablet Take 1 tablet (20 mg total) by mouth daily. 90 tablet 3   clopidogrel (PLAVIX) 75 MG tablet Take 1 tablet (75 mg total) by mouth daily. 90 tablet 3   gabapentin (NEURONTIN) 400 MG capsule Take one capsule by mouth in the morning, and two capsules by mouth at bedtime. 90 capsule 3   Ibuprofen-diphenhydrAMINE HCl (IBUPROFEN PM) 200-25 MG CAPS Take 3 tablets by mouth at bedtime as needed (sleep).  lactose free nutrition (BOOST) LIQD Take 237 mLs by mouth 2 (two) times daily between meals.       losartan (COZAAR) 25 MG tablet Take 1 tablet (25 mg total) by mouth daily. 90 tablet 1   OVER THE COUNTER MEDICATION Take 1 capsule by mouth daily. Stacker energy pill       phenylephrine (SUDAFED PE) 10 MG TABS tablet Take 10 mg by mouth every 4 (four) hours as needed (allergies).       Sennosides (EX-LAX PO) Take 1 tablet by mouth daily as needed (constipation).       buPROPion (WELLBUTRIN XL) 150 MG 24 hr tablet TAKE 1 TABLET (150 MG TOTAL) BY MOUTH EVERY MORNING. (Patient not taking: No sig reported) 90 tablet 1   ceFAZolin (ANCEF) IVPB Inject 2 g into the vein every 8 (eight) hours. Indication:  Bypass graft infection First Dose: No Last Day of Therapy: 10/17/20 Labs - Once weekly:  CBC/D and BMP, Labs - Every other week:  ESR and CRP Method of administration: IV Push Method of administration may be changed at the discretion of home infusion pharmacist based upon assessment of the patient and/or caregiver's ability to self-administer the medication ordered. (Patient not taking: No sig reported) 126 Units 0   traMADol (ULTRAM) 50 MG tablet Take 1 tablet (50 mg total) by mouth every 12 (twelve) hours  as needed. (Patient not taking: No sig reported) 10 tablet 0   Vitamin D, Ergocalciferol, (DRISDOL) 1.25 MG (50000 UNIT) CAPS capsule Take 1 capsule (50,000 Units total) by mouth 2 (two) times a week. (Patient not taking: No sig reported) 24 capsule 0      Drug Regimen Review  Drug regimen was reviewed and remains appropriate with no significant issues identified   Home: Home Living Family/patient expects to be discharged to:: Private residence Living Arrangements: Other relatives, Other (Comment) Available Help at Discharge: Family, Available 24 hours/day Type of Home: House Home Access: Stairs to enter [employer] of Steps: 4 Entrance Stairs-Rails: Left, Right, Can reach both Home Layout: One level Bathroom Shower/Tub: Chiropodist: Standard Home Equipment: Environmental consultant - 2 wheels, Shower seat, Crutches, Wheelchair - manual, Grab bars - tub/shower   Functional History: Prior Function (Read Only) Level of Independence: Independent with assistive device(s) (Read Only) Comments: RW on stairs and to walk   Functional Status:  Mobility: Bed Mobility Overal bed mobility: Needs Assistance Bed Mobility: Supine to Sit Supine to sit: Supervision Sit to supine: Min guard General bed mobility comments: Up in chair upon PT Arrival. Transfers Overall transfer level: Needs assistance Equipment used: Rolling walker (2 wheeled) Transfers: Sit to/from Stand Sit to Stand: Min guard General transfer comment: Min A to power to standing from low chair with cues for hand placement; pt grunts during transition due to pain. Ambulation/Gait Ambulation/Gait assistance: Min guard Gait Distance (Feet): 100 Feet Assistive device: Rolling walker (2 wheeled) Gait Pattern/deviations:  ("hop to") General Gait Details: SLow, steady gait with heavy reliance on UEs on RW; a few instances of sharp shooting pains through left residual limb. Cues to extend LLE in standing. Gait  velocity: decreased   ADL: ADL Overall ADL's : Needs assistance/impaired Eating/Feeding: Independent, Sitting Grooming: Supervision/safety, Wash/dry hands, Wash/dry face, Standing Upper Body Bathing: Independent, Sitting Lower Body Bathing: Minimal assistance, Sitting/lateral leans Upper Body Dressing : Set up, Sitting Lower Body Dressing: Minimal assistance, Sitting/lateral leans, Sit to/from stand Toilet Transfer: Min guard, Ambulation, BSC, RW Toileting- Clothing Manipulation and Hygiene: Min guard,  Cueing for safety, Cueing for sequencing, Sitting/lateral lean Functional mobility during ADLs: Min guard, Rolling walker General ADL Comments: Pt limited by pain and balance   Cognition: Cognition Overall Cognitive Status: Within Functional Limits for tasks assessed Orientation Level: Oriented X4 Cognition Arousal/Alertness: Awake/alert Behavior During Therapy: WFL for tasks assessed/performed Overall Cognitive Status: Within Functional Limits for tasks assessed     Blood pressure 119/70, pulse 79, temperature 98.2 F (36.8 C), temperature source Oral, resp. rate 19, height 5' 7"  (1.702 m), weight 63.5 kg, SpO2 100 %. Physical Exam Vitals and nursing note reviewed. Exam conducted with a chaperone present.  Constitutional:      Appearance: Normal appearance.     Comments: Sitting at EOB with good balance. NAD. Seen in bedside chair- after eating lunch; nurse giving meds; NAD - thought lovenox could cause worse phantom pain- educated pt. Cachetic pt.   HENT:     Head: Normocephalic and atraumatic.     Right Ear: External ear normal.     Left Ear: External ear normal.     Nose: Nose normal. No congestion.     Mouth/Throat:     Mouth: Mucous membranes are dry.     Pharynx: Oropharynx is clear. No oropharyngeal exudate.  Eyes:     General:        Right eye: No discharge.        Left eye: No discharge.     Extraocular Movements: Extraocular movements intact.  Cardiovascular:      Rate and Rhythm: Regular rhythm. Tachycardia present.     Heart sounds: Normal heart sounds. No murmur heard.   No gallop.  Pulmonary:     Comments: A little coarse on R side- cleared with coughing- otherwise, no W/R/R Abdominal:     General: Abdomen is flat. Bowel sounds are normal.     Palpations: Abdomen is soft.     Comments: Soft, NT, ND, (+)BS    Musculoskeletal:     Cervical back: Normal range of motion. No rigidity.     Comments: Hight L-AKA with dry dressing. MS: UE strength 5/5; RLE 5/5 LLE- didn't test due to high AKA except could lift against gravity- HF  Skin:    Comments: Pt didn't allow me to assess- since "just walked- is throbbing- asked for pain meds L groin incision assessed - healed by moist- no yeast seen   Neurological:     Mental Status: He is alert and oriented to person, place, and time.     Comments: Intact to light touch in UE and RLE-  Psychiatric:        Mood and Affect: Mood normal.        Behavior: Behavior normal.      Lab Results Last 48 Hours  No results found for this or any previous visit (from the past 48 hour(s)).   Imaging Results (Last 48 hours)  No results found.           Medical Problem List and Plan: 1.  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  PAD/Antithrombotics: -DVT/anticoagulation:  Pharmaceutical: Lovenox             -antiplatelet therapy: Plavix 3. Pain Management: Oxycodone prn for severe pain and ultram prn for moderate pain  4. Mood: LCSW to follow for evaluation and support.              -antipsychotic agents: N/A 5. Neuropsych: This patient is capable of making decisions on his own behalf. 6. Skin/Wound Care: Monitor wound for healing.   --On Zinc, vitamin C, boost and prosource to promote healing.  7. Fluids/Electrolytes/Nutrition: Monitor I/O.  8.  GERD: On  Protonix 9. Acute  blood loss anemia: Recheck CBC in am.  10 Tobacco abuse: Continue to encourage tobacco cessation.              --nicotine patch to prevent withdrawal.  11. Hyponatremia: Recheck CMET in am  12. Prediabetes: Hgb A1C- 6.0. Add CM restrictions to diet as fasting BS 107-150 range             --Monitor CBGs daily in am.  13. Elevated A phos: Likely due to infection. Recheck LFTs in am.  14. HTN: Cozaar remains on hold.  --Continue Coreg bid       I have personally performed a face to face diagnostic evaluation of this patient and formulated the key components of the plan.  Additionally, I have personally reviewed laboratory data, imaging studies, as well as relevant notes and concur with the physician assistant's documentation above.   The patient's status has not changed from the original H&P.  Any changes in documentation from the acute care chart have been noted above.       Bary Leriche, PA-C 12/15/2020

## 2020-12-16 ENCOUNTER — Ambulatory Visit: Payer: Self-pay

## 2020-12-16 DIAGNOSIS — S78112A Complete traumatic amputation at level between left hip and knee, initial encounter: Secondary | ICD-10-CM | POA: Diagnosis not present

## 2020-12-16 LAB — CBC WITH DIFFERENTIAL/PLATELET
Abs Immature Granulocytes: 0.01 10*3/uL (ref 0.00–0.07)
Basophils Absolute: 0 10*3/uL (ref 0.0–0.1)
Basophils Relative: 0 %
Eosinophils Absolute: 0.2 10*3/uL (ref 0.0–0.5)
Eosinophils Relative: 3 %
HCT: 24.3 % — ABNORMAL LOW (ref 39.0–52.0)
Hemoglobin: 7.9 g/dL — ABNORMAL LOW (ref 13.0–17.0)
Immature Granulocytes: 0 %
Lymphocytes Relative: 25 %
Lymphs Abs: 1.7 10*3/uL (ref 0.7–4.0)
MCH: 24.4 pg — ABNORMAL LOW (ref 26.0–34.0)
MCHC: 32.5 g/dL (ref 30.0–36.0)
MCV: 75 fL — ABNORMAL LOW (ref 80.0–100.0)
Monocytes Absolute: 0.4 10*3/uL (ref 0.1–1.0)
Monocytes Relative: 7 %
Neutro Abs: 4.4 10*3/uL (ref 1.7–7.7)
Neutrophils Relative %: 65 %
Platelets: 727 10*3/uL — ABNORMAL HIGH (ref 150–400)
RBC: 3.24 MIL/uL — ABNORMAL LOW (ref 4.22–5.81)
RDW: 20.9 % — ABNORMAL HIGH (ref 11.5–15.5)
WBC: 6.8 10*3/uL (ref 4.0–10.5)
nRBC: 0 % (ref 0.0–0.2)

## 2020-12-16 LAB — COMPREHENSIVE METABOLIC PANEL
ALT: 32 U/L (ref 0–44)
AST: 25 U/L (ref 15–41)
Albumin: 2.5 g/dL — ABNORMAL LOW (ref 3.5–5.0)
Alkaline Phosphatase: 135 U/L — ABNORMAL HIGH (ref 38–126)
Anion gap: 10 (ref 5–15)
BUN: 12 mg/dL (ref 8–23)
CO2: 25 mmol/L (ref 22–32)
Calcium: 9 mg/dL (ref 8.9–10.3)
Chloride: 98 mmol/L (ref 98–111)
Creatinine, Ser: 0.75 mg/dL (ref 0.61–1.24)
GFR, Estimated: >60 mL/min (ref >60)
Glucose, Bld: 128 mg/dL — ABNORMAL HIGH (ref 70–99)
Potassium: 4.4 mmol/L (ref 3.5–5.1)
Sodium: 133 mmol/L — ABNORMAL LOW (ref 135–145)
Total Bilirubin: 0.4 mg/dL (ref 0.3–1.2)
Total Protein: 6.3 g/dL — ABNORMAL LOW (ref 6.5–8.1)

## 2020-12-16 MED ORDER — GABAPENTIN 100 MG PO CAPS
100.0000 mg | ORAL_CAPSULE | Freq: Every day | ORAL | Status: DC
  Administered 2020-12-16: 100 mg via ORAL
  Filled 2020-12-16: qty 1

## 2020-12-16 NOTE — Evaluation (Signed)
Physical Therapy Assessment and Plan  Patient Details  Name: Benjamin [name] MRN: [id number] Date of Birth: 10-07-1960  PT Diagnosis: Abnormal posture, Abnormality of gait, Cognitive deficits, Difficulty walking, Impaired cognition, Impaired sensation, Muscle weakness, and Pain in L residual limb Rehab Potential: Good ELOS: 7-10 days   Today's Date: 12/16/2020 PT Individual Time: 4967-5916 PT Individual Time Calculation (min): 54 min    Hospital Problem: Principal Problem:   Unilateral AKA, left ([place])   Past Medical History:  Past Medical History:  Diagnosis Date   Back pain    Heavy smoker    Hyperlipidemia LDL goal <70    Hypertension    Peripheral arterial occlusive disease ([place]) 06/2013   Bilateral femoral artery disease   Past Surgical History:  Past Surgical History:  Procedure Laterality Date   ABDOMINAL AORTOGRAM W/LOWER EXTREMITY N/A 07/12/2016   Procedure: Abdominal Aortogram w/Lower Extremity;  Surgeon: Serafina Mitchell, MD;  Location: [place] [facility];  Service: Cardiovascular;  Laterality: N/A;  Bilateral extermity: Patent Renal As. No sig Dz in infrarenal Abd Aorta. Normal Bilat Iliac arteries. R SFA is 100% @ origin - recon in AK-Pop A. R PT A patent. L CFA occluded. L PFA recon @ origin, L SFA occluded w/ recon in AK Pop A.   ABDOMINAL AORTOGRAM W/LOWER EXTREMITY N/A 10/15/2019   Procedure: ABDOMINAL AORTOGRAM W/LOWER EXTREMITY;  Surgeon: Serafina Mitchell, MD;  Location: [place] [facility];  Service: Cardiovascular;  Laterality: N/A;   AMPUTATION Left 10/04/2020   Procedure: LEFT ABOVE KNEE AMPUTATION;  Surgeon: Serafina Mitchell, MD;  Location: [place];  Service: Vascular;  Laterality: Left;   AMPUTATION Left 12/08/2020   Procedure: RIGHT ABOVE KNEE AMPUTATION REVISION;  Surgeon: Angelia Mould, MD;  [facility];  Service: Vascular;  Laterality: Left;   AORTA - BILATERAL FEMORAL ARTERY BYPASS GRAFT N/A 05/08/2020   Procedure: AORTA BIFEMORAL BYPASS  GRAFT;  Surgeon: Serafina Mitchell, MD;  [facility];  Service: Vascular;  Laterality: N/A;   COLONOSCOPY  06/2016   never   ENDARTERECTOMY FEMORAL Left 11/14/2019   Procedure: Left groin exploration, Redo left femoral artery exposure;  Surgeon: Serafina Mitchell, MD;  Location: [facility];  Service: Vascular;  Laterality: Left;   FEMORAL-FEMORAL BYPASS GRAFT N/A 11/13/2019   Procedure: BYPASS GRAFT FEMORAL-FEMORAL ARTERY RIGHT TO LEFT USING HEMASHIELD GOLD GRAFT 43m x 30cm;  Surgeon: BSerafina Mitchell MD;  [facility];  Service: Vascular;  Laterality: N/A;   FEMORAL-POPLITEAL BYPASS GRAFT Left 11/13/2019   Procedure: LEFT FEMORAL BELOW KNEE-POPLITEAL ARTERY USING NON-REVERSED GREATER SAPHENOUS VEIN;  Surgeon: BSerafina Mitchell MD;  [facility];  Service: Vascular;  Laterality: Left;   FEMORAL-POPLITEAL BYPASS GRAFT Left 05/08/2020   Procedure: LEFT REDO BYPASS GRAFT COMMON FEMORAL- BELOW KNEE POPLITEAL ARTERY USING PROPATEN GRAFT;  Surgeon: BSerafina Mitchell MD;  [facility];  Service: Vascular;  Laterality: Left;   I & D EXTREMITY Left 09/07/2020   Procedure: IRRIGATION AND DEBRIDEMENT LEFT LEG WITH EXCISION OF LEFT LEG DISTAL BYPASS GRAFT;  Surgeon: CMarty Heck MD;  [facility]  Service: Vascular;  Laterality: Left;   LOWER EXTREMITY ANGIOGRAM Left 11/14/2019   Procedure: LEFT LOWER EXTREMITY ANGIOGRAM, BYPASS GRAFT ANGIOPLASTY;  Surgeon: BSerafina Mitchell MD;  [facility];  Service: Vascular;  Laterality: Left;   PERIPHERAL VASCULAR INTERVENTION  10/15/2019   Procedure: PERIPHERAL VASCULAR INTERVENTION;  Surgeon: BSerafina Mitchell MD;  [facility];  Service: Cardiovascular;;  Rt Iliac   REMOVAL OF GRAFT Left 10/04/2020   Procedure: REMOVAL OF LEFT FEMORAL TO POPITEAL BYPASS GRAFT;  Surgeon: Serafina Mitchell, MD;  [facility];  Service: Vascular;  Laterality: Left;    Assessment & Plan Clinical Impression: Patient is a 60 y.o. year old male  with history of PAD s/p multiple BLE bypass grafts, ongoing heavy tobacco abuse, L-AKA, removal of infected femoral-popliteal graft 10/04/20 with wound dehiscence,bone exposure,drainage and cellulitis. He was started on IV Zosyn and cultures grew out Enterococcus, Entrobacter and Klebsiella. He underwent wound revision of L-AKA on 10/18 by Dr. Scot Dock and post op tachycardia treated with low dose coreg.  He completed course of antibiotics on 10/23 and leucocytosis has resolve. Anemia being monitored with H/H stable at 7.8 and Na levels continue to fluctuate. Therapy ongoing and patient limited by pain, weakness and fatigue. CIR recommended due to functional decline  Patient currently requires min with mobility secondary to muscle weakness and muscle joint tightness, decreased cardiorespiratoy endurance, decreased problem solving, and decreased standing balance, decreased postural control, decreased balance strategies, and difficulty maintaining precautions.  Prior to hospitalization, patient was modified independent  with mobility and lived with Family (sister, nephew, and niece) in a House home.  Home access is 5Stairs to enter.  Patient will benefit from skilled PT intervention to maximize safe functional mobility, minimize fall risk, and decrease caregiver burden for planned discharge home with 24 hour supervision.  Anticipate patient will benefit from follow up OP at discharge.  PT - End of Session Activity Tolerance: Tolerates 30+ min activity with multiple rests Endurance Deficit: Yes Endurance Deficit Description: required rest breaks during session PT Assessment Rehab Potential (ACUTE/IP ONLY): Good PT Barriers to Discharge: Decreased caregiver support;Wound Care PT Barriers to Discharge Comments: pain, limited caregiver support as pt's sister has MS and cannot provide physical assist. PT Patient demonstrates impairments in the following area(s):  Balance;Endurance;Nutrition;Pain;Perception;Sensory;Skin Integrity PT Transfers Functional Problem(s): Bed Mobility;Bed to Chair;Car;Furniture PT Locomotion Functional Problem(s): Ambulation;Wheelchair Mobility;Stairs PT Plan PT Intensity: Minimum of 1-2 x/day ,45 to 90 minutes PT Frequency: 5 out of 7 days PT Duration Estimated Length of Stay: 7-10 days PT Treatment/Interventions: Ambulation/gait training;Discharge planning;Functional mobility training;Therapeutic Activities;Visual/perceptual remediation/compensation;Balance/vestibular training;Disease management/prevention;Neuromuscular re-education;Skin care/wound management;Therapeutic Exercise;Wheelchair propulsion/positioning;Cognitive remediation/compensation;DME/adaptive equipment instruction;Pain management;Splinting/orthotics;UE/LE Strength taining/ROM;Community reintegration;Patient/family education;Stair training;UE/LE Coordination activities PT Transfers Anticipated Outcome(s): Mod I PT Locomotion Anticipated Outcome(s): Mod I PT Recommendation Follow Up Recommendations: Outpatient PT Patient destination: Home Equipment Recommended: To be determined Equipment Details: has RW, WC, and crutches  PT Evaluation Precautions/Restrictions Precautions Precautions: Fall Precaution Comments: pain, monitor HR Restrictions Weight Bearing Restrictions: Yes LLE Weight Bearing: Non weight bearing Pain Interference Pain Interference Pain Effect on Sleep: 1. Rarely or not at all Pain Interference with Therapy Activities: 4. Almost constantly Pain Interference with Day-to-Day Activities: 4. Almost constantly Home Living/Prior Functioning Home Living Available Help at Discharge: Family;Available 24 hours/day Type of Home: House Home Access: Stairs to enter [employer] of Steps: 5 Entrance Stairs-Rails: Right;Left (cannot reach both, but has ramp on the back entrance.) Home Layout: One level Bathroom Shower/Tub: Scientist, forensic: Standard Bathroom Accessibility: Yes Additional Comments: Lives with sister who has MS and uses RW to walk around. Pt reports his brother passed away last night.  Lives With: Family (sister, nephew, and niece) Prior Function Level of Independence: Requires assistive device for independence;Independent with homemaking with wheelchair  Able to Take Stairs?: No Driving: No (hasn't driven since March) Vocation: On  disability Leisure: Hobbies-yes (Comment) Vision/Perception  Vision - History Ability to See in Adequate Light: 0 Adequate Perception Perception: Within Functional Limits Praxis Praxis: Intact  Cognition Overall Cognitive Status: Within Functional Limits for tasks assessed Arousal/Alertness: Awake/alert Orientation Level: Oriented X4 Memory: Appears intact Awareness: Appears intact Problem Solving: Impaired Safety/Judgment: Appears intact Sensation Sensation Light Touch: Appears Intact Proprioception: Appears Intact Additional Comments: decreased sensation along L residual limb Coordination Gross Motor Movements are Fluid and Coordinated: Yes Fine Motor Movements are Fluid and Coordinated: No Coordination and Movement Description: uncoordinated due to pain, decreased balance/postural control, altered balance strategies, and generalized weakness/deconditioning Finger Nose Finger Test: dysmetria bilaterally and slow, as well as decreased precision with finger opposition Heel Shin Test: WFL on RLE, unable to perform on LLE due to AKA and pain Motor  Motor Motor: Abnormal postural alignment and control Motor - Skilled Clinical Observations: uncoordinated due to pain, decreased balance/postural control, altered balance strategies, and generalized weakness/deconditioning  Trunk/Postural Assessment  Cervical Assessment Cervical Assessment: Exceptions to [facility] (forward head) Thoracic Assessment Thoracic Assessment: Exceptions to [facility] (kyphosis) Lumbar  Assessment Lumbar Assessment: Exceptions to [facility] (posterior pelvic tilt) Postural Control Postural Control: Deficits on evaluation (altered balance strategies due to L AKA)  Balance Balance Balance Assessed: Yes Static Sitting Balance Static Sitting - Balance Support: Feet supported;No upper extremity supported Static Sitting - Level of Assistance: 7: Independent Dynamic Sitting Balance Dynamic Sitting - Balance Support: Feet supported;No upper extremity supported Dynamic Sitting - Level of Assistance: 6: Modified independent (Device/Increase time) Static Standing Balance Static Standing - Balance Support: Bilateral upper extremity supported (RW) Static Standing - Level of Assistance: 5: Stand by assistance (CGA) Dynamic Standing Balance Dynamic Standing - Balance Support: Bilateral upper extremity supported (RW) Dynamic Standing - Level of Assistance: 4: Min assist Extremity Assessment  RLE Assessment RLE Assessment: Exceptions to [facility] General Strength Comments: grossly generalized to 4-/5 LLE Assessment LLE Assessment: Exceptions to [facility] LLE Strength Left Hip Flexion: 2+/5 Left Hip ABduction: 2+/5 Left Hip ADduction: 2+/5  Care Tool Care Tool Bed Mobility Roll left and right activity   Roll left and right assist level: Supervision/Verbal cueing    Sit to lying activity   Sit to lying assist level: Supervision/Verbal cueing    Lying to sitting on side of bed activity   Lying to sitting on side of bed assist level: the ability to move from lying on the back to sitting on the side of the bed with no back support.: Supervision/Verbal cueing     Care Tool Transfers Sit to stand transfer   Sit to stand assist level: Minimal Assistance - Patient > 75%    Chair/bed transfer   Chair/bed transfer assist level: Minimal Assistance - Patient > 75%     Toilet transfer   Assist Level: Minimal Assistance - Patient > 75%    Car transfer Car transfer activity did not occur:  Environmental limitations        Care Tool Locomotion Ambulation   Assist level: Minimal Assistance - Patient > 75% Assistive device: Walker-rolling Max distance: 70f  Walk 10 feet activity   Assist level: Minimal Assistance - Patient > 75% Assistive device: Walker-rolling   Walk 50 feet with 2 turns activity Walk 50 feet with 2 turns activity did not occur: Safety/medical concerns (fatigue, pain, decreased balance, weakness/deconditioning)      Walk 150 feet activity Walk 150 feet activity did not occur: Safety/medical concerns (fatigue, pain, decreased balance, weakness/deconditioning)      Walk 10  feet on uneven surfaces activity Walk 10 feet on uneven surfaces activity did not occur: Safety/medical concerns (fatigue, pain, decreased balance, weakness/deconditioning)      Stairs Stair activity did not occur: Safety/medical concerns (fatigue, pain, decreased balance, weakness/deconditioning)        Walk up/down 1 step activity Walk up/down 1 step or curb (drop down) activity did not occur: Safety/medical concerns (fatigue, pain, decreased balance, weakness/deconditioning)      Walk up/down 4 steps activity Walk up/down 4 steps activity did not occur: Safety/medical concerns (fatigue, pain, decreased balance, weakness/deconditioning)      Walk up/down 12 steps activity Walk up/down 12 steps activity did not occur: Safety/medical concerns (fatigue, pain, decreased balance, weakness/deconditioning)      Pick up small objects from floor Pick up small object from the floor (from standing position) activity did not occur: Safety/medical concerns (decreased balance, weakness/deconditioning)      Wheelchair Is the patient using a wheelchair?: Yes Type of Wheelchair: Manual   Wheelchair assist level: Supervision/Verbal cueing Max wheelchair distance: 129f  Wheel 50 feet with 2 turns activity   Assist Level: Supervision/Verbal cueing  Wheel 150 feet activity   Assist Level:  Supervision/Verbal cueing    Refer to Care Plan for Long Term Goals  SHORT TERM GOAL WEEK 1 PT Short Term Goal 1 (Week 1): STG=LTG due to LOS  Recommendations for other services: None   Skilled Therapeutic Intervention Evaluation completed (see details above and below) with education on PT POC and goals and individual treatment initiated with focus on functional mobility/transfers, generalized strengthening, dynamic standing balance/coordination, ambulation, and improved activity tolerance. Received pt sitting EOB, pt educated on PT evaluation, CIR policies, and therapy schedule and agreeable. Pt reported pain 8/10 in L residual limb during session (premedicated) and RN arrived to administer more medication. Pt also reported not sleeping well last night as his brother passed away last night. Pt transferred sit<>supine and supine<>sitting EOB with supervision from flat bed using bedrails and transferred bed<>WC stand<>pivot with RW and min A. Pt performed WC mobility >156fusing BUE and supervision through hallways. Sit<>stands with RW and CGA/min A and ambulated 4752f 2 trials with RW and min A. Pt demonstrates good control/landing on RLE but educated pt on importance of wearing shoe to protect LE. Pt performed WC mobility additional [id number] BUE and supervision back to room and transferred WC<>bed stand<>pivot without AD and min A. Concluded session with pt EOB with all needs within reach and bed alarm on.   Mobility Bed Mobility Bed Mobility: Rolling Right;Rolling Left;Sit to Supine;Supine to Sit Rolling Right: Supervision/verbal cueing Rolling Left: Supervision/Verbal cueing Supine to Sit: Supervision/Verbal cueing Sit to Supine: Supervision/Verbal cueing Transfers Transfers: Sit to Stand;Stand to Sit;Stand Pivot Transfers Sit to Stand: Minimal Assistance - Patient > 75% Stand to Sit: Contact Guard/Touching assist Stand Pivot Transfers: Minimal Assistance - Patient > 75% Transfer  (Assistive device): Rolling walker Locomotion  Gait Ambulation: Yes Gait Assistance: Minimal Assistance - Patient > 75% Gait Distance (Feet): 47 Feet Assistive device: Rolling walker Gait Assistance Details: Verbal cues for technique;Verbal cues for precautions/safety;Verbal cues for safe use of DME/AE Gait Assistance Details: verbal cues for ecentric control when landing, increased R foot clearance, and to maintain RW within BOS Gait Gait: Yes Gait Pattern: Impaired Gait Pattern: Decreased stride length;Decreased step length - right;Poor foot clearance - right Gait velocity: decreased Wheelchair Mobility Wheelchair Mobility: Yes Wheelchair Assistance: SupeChartered loss adjusterth upper extremities Wheelchair Parts Management: Needs assistance Distance:  19f   Discharge Criteria: Patient will be discharged from PT if patient refuses treatment 3 consecutive times without medical reason, if treatment goals not met, if there is a change in medical status, if patient makes no progress towards goals or if patient is discharged from hospital.  The above assessment, treatment plan, treatment alternatives and goals were discussed and mutually agreed upon: by patient  AAlfonse AlpersPT, DPT  12/16/2020, 12:07 PM

## 2020-12-16 NOTE — Progress Notes (Addendum)
Inpatient Rehabilitation Admission Medication Review by a Pharmacist  A complete drug regimen review was completed for this patient to identify any potential clinically significant medication issues.  High Risk Drug Classes Is patient taking? Indication by Medication  Antipsychotic Yes Prochlorperazine PRN nausea   Anticoagulant Yes Enoxaparin for DVT prophylaxis  Antibiotic No   Opioid Yes Oxycodone and tramadol PRN acute pain  Antiplatelet Yes Clopidogrel for PAD  Hypoglycemics/insulin No   Vasoactive Medication Yes   Chemotherapy No   Other No      Type of Medication Issue Identified Description of Issue Recommendation(s)  Drug Interaction(s) (clinically significant)     Duplicate Therapy     Allergy     No Medication Administration End Date     Incorrect Dose     Additional Drug Therapy Needed     Significant med changes from prior encounter (inform family/care partners about these prior to discharge). Home Gabapentin 400mg  QAM/800mg  QHS, ibuprofen, phenylephrine, vitamin D 50,000 unit capsules were held during inpatient admission and have not been restarted per discharge summary   Home losartan 25mg  was discontinued during inpatient admission and patient was directed to stop per discharge summary   Carvedilol is a new medication  Restart Gabapentin 400mg  QAM/800mg  QHS, ibuprofen, phenylephrine, vitamin D 50,000 unit capsules if clinically appropriate   Monitor HR/BP and restart losartan (or increase carvedilol) if appropriate    Educate patient on discharge  Other       Clinically significant medication issues were identified that warrant physician communication and completion of prescribed/recommended actions by midnight of the next day:  No  Time spent performing this drug regimen review (minutes):  15  , PharmD, [place], [facility] Clinical Pharmacist  Please check AMION for all [facility] phone numbers After 10:00 PM, call Main Pharmacy  [phone]   Addendum:  Med review done 10/25 pm by another pharmacist, who is not working today. Copied into chart note.  [facility], RPh 12/16/2020 2:06 PM

## 2020-12-16 NOTE — Progress Notes (Signed)
Inpatient Rehabilitation  Patient information reviewed and entered into eRehab system by Rhett Najera Emerie Vanderkolk, OTR/L.   Information including medical coding, functional ability and quality indicators will be reviewed and updated through discharge.    

## 2020-12-16 NOTE — Progress Notes (Signed)
Patient ID: Benjamin [name], male   DOB: 16-Aug-1960, 60 y.o.   MRN: [id number] Informed pt the team were able to conference him and plan on target discharge for 11/2 and goals are mod/I-supervision level. He wants his staples out before he goes he knows this is up to the MD. Will confirm equipment he has at home with sister. Left message for her

## 2020-12-16 NOTE — Progress Notes (Signed)
Inpatient Rehabilitation Care Coordinator Assessment and Plan Patient Details  Name: Benjamin [name] MRN: [id number] Date of Birth: Mar 02, 1960  Today's Date: 12/16/2020  Hospital Problems: Principal Problem:   Unilateral AKA, left [facility])  Past Medical History:  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   Bilateral femoral artery disease   Past Surgical History:  Past Surgical History:  Procedure Laterality Date   ABDOMINAL AORTOGRAM W/LOWER EXTREMITY N/A 07/12/2016   Procedure: Abdominal Aortogram w/Lower Extremity;  Surgeon: Nada Libman, MD;  Location: [facility] INV[facility] CV [facility];  Service: Cardiovascular;  Laterality: N/A;  Bilateral extermity: Patent Renal As. No sig Dz in infrarenal Abd Aorta. Normal Bilat Iliac arteries. R SFA is 100% @ origin - recon in AK-Pop A. R PT A patent. L CFA occluded. L PFA recon @ origin, L SFA occluded w/ recon in AK Pop A.   ABDOMINAL AORTOGRAM W/LOWER EXTREMITY N/A 10/15/2019   Procedure: ABDOMINAL AORTOGRAM W/LOWER EXTREMITY;  Surgeon: Nada Libman, MD;  Location: [facility];  Service: Cardiovascular;  Laterality: N/A;   AMPUTATION Left 10/04/2020   Procedure: LEFT ABOVE KNEE AMPUTATION;  Surgeon: Nada Libman, MD;  Location: [facility] OR;  Service: Vascular;  Laterality: Left;   AMPUTATION Left 12/08/2020   Procedure: RIGHT ABOVE KNEE AMPUTATION REVISION;  Surgeon: Chuck Hint, MD;  Location: [facility] OR;  Service: Vascular;  Laterality: Left;   AORTA - BILATERAL FEMORAL ARTERY BYPASS GRAFT N/A 05/08/2020   Procedure: AORTA BIFEMORAL BYPASS GRAFT;  Surgeon: Nada Libman, MD;  Location: [facility] OR;  Service: Vascular;  Laterality: N/A;   COLONOSCOPY  06/2016   never   ENDARTERECTOMY FEMORAL Left 11/14/2019   Procedure: Left groin exploration, Redo left femoral artery exposure;  Surgeon: Nada Libman, MD;   Location: [facility] OR;  Service: Vascular;  Laterality: Left;   FEMORAL-FEMORAL BYPASS GRAFT N/A 11/13/2019   Procedure: BYPASS GRAFT FEMORAL-FEMORAL ARTERY RIGHT TO LEFT USING HEMASHIELD GOLD GRAFT 53mm x 30cm;  Surgeon: Nada Libman, MD;  Location: [facility] OR;  Service: Vascular;  Laterality: N/A;   FEMORAL-POPLITEAL BYPASS GRAFT Left 11/13/2019   Procedure: LEFT FEMORAL BELOW KNEE-POPLITEAL ARTERY USING NON-REVERSED GREATER SAPHENOUS VEIN;  Surgeon: Nada Libman, MD;  Location: [facility] OR;  Service: Vascular;  Laterality: Left;   FEMORAL-POPLITEAL BYPASS GRAFT Left 05/08/2020   Procedure: LEFT REDO BYPASS GRAFT COMMON FEMORAL- BELOW KNEE POPLITEAL ARTERY USING PROPATEN GRAFT;  Surgeon: Nada Libman, MD;  Location: [facility] OR;  Service: Vascular;  Laterality: Left;   I & D EXTREMITY Left 09/07/2020   Procedure: IRRIGATION AND DEBRIDEMENT LEFT LEG WITH EXCISION OF LEFT LEG DISTAL BYPASS GRAFT;  Surgeon: Cephus Shelling, MD;  Location: [facility] OR;  Service: Vascular;  Laterality: Left;   LOWER EXTREMITY ANGIOGRAM Left 11/14/2019   Procedure: LEFT LOWER EXTREMITY ANGIOGRAM, BYPASS GRAFT ANGIOPLASTY;  Surgeon: Nada Libman, MD;  Location: [facility] OR;  Service: Vascular;  Laterality: Left;   PERIPHERAL VASCULAR INTERVENTION  10/15/2019   Procedure: PERIPHERAL VASCULAR INTERVENTION;  Surgeon: Nada Libman, MD;  [facility]: [facility];  Service: Cardiovascular;;  Rt Iliac   REMOVAL OF GRAFT Left 10/04/2020   Procedure: REMOVAL OF LEFT FEMORAL TO POPITEAL BYPASS GRAFT;  Surgeon: Nada Libman, MD;  Location: [facility] OR;  Service: Vascular;  Laterality: Left;   Social History:  reports that he has been smoking cigarettes. He has a 43.50 pack-year smoking history. He has never used smokeless tobacco. He reports that he does not drink alcohol and does not use drugs.  Family / Support Systems Marital Status: Single Patient Roles: Other (Comment) (sibling) Other Supports: [name]-sister  [phone] Anticipated Caregiver: Sister Ability/Limitations of Caregiver: Sister has MS and uses a walker, but is there along with her other fmaily members Caregiver Availability: 24/7 Family Dynamics: Close with his family and they will make sure he has what he needs at home. Pt is quite stubborn and does what he wants to do, even if not the best for him  Social History Preferred language: English Religion: Christian Cultural Background: No issues Education: HS Health Literacy - How often do you need to have someone help you when you read instructions, pamphlets, or other written material from your doctor or pharmacy?: Sometimes Writes: Yes Employment Status: Disabled Marine scientist Issues: No issues Guardian/Conservator: None-according to MD pt is capable of making his own decisions, but seems to need extra instructions. Will include sister in any decisions or for input while here   Abuse/Neglect Abuse/Neglect Assessment Can Be Completed: Yes Physical Abuse: Denies Verbal Abuse: Denies Sexual Abuse: Denies Exploitation of patient/patient's resources: Denies Self-Neglect: Denies  Patient response to: Social Isolation - How often do you feel lonely or isolated from those around you?: Never  Emotional Status Pt's affect, behavior and adjustment status: Pt is wanting to get his staples out before he goes home he seems to perservates on this. He informed worker he takes care of himself and gets around ok. He was driving prior to admission Recent Psychosocial Issues: wound issues and non-healing amputation Psychiatric History: No history do feel pt would benefit from seeing neuro-psych while here due to seems to be a disconnect with his healing and the habits he has and what he is doing Substance Abuse History: Tobacco does not plan to quit  Patient / Family Perceptions, Expectations & Goals Pt/Family understanding of illness & functional limitations: Pt can explain  his amputation has been revised and he wants to get a new leg. His sister is more aware of the health issues of pt but reports he does what he wants to do. Pt took multiple attempts/tries to come back into the hospital for his wound-was here then left AMA. Would continued to get worse as time passed Premorbid pt/family roles/activities: Brother, friend, church member Anticipated changes in roles/activities/participation: resume Pt/family expectations/goals: Pt states: " I want to get my staples out before I go.'" Sister states: " I hope he does what he is suppose to do to heal."  [employer]: Other (Comment) (Wound Clinic patient) Premorbid Home Care/DME Agencies: Other (Comment) (had [name] but will not take him back) Transportation available at discharge: Self family will also Is the patient able to respond to transportation needs?: Yes In the past 12 months, has lack of transportation kept you from medical appointments or from getting medications?: No In the past 12 months, has lack of transportation kept you from meetings, work, or from getting things needed for daily living?: No Resource referrals recommended: Neuropsychology  Discharge Planning Living Arrangements: Other relatives Support Systems: Other relatives, Friends/neighbors Type of Residence: Private residence Insurance Resources: [employer] (specify county) Surveyor, quantity Resources: [employer] Financial Screen Referred: No Living Expenses: Lives with family Money Management: Family Does the patient have any problems obtaining your medications?: No Home Management: Sister and her family Patient/Family Preliminary Plans: Return home  with his sister, pt was mod/i prior to admission with walker or his wheelchair. But he did not use the wheelchair much didnt like it. He has all needed equipment-tub seat, rw, wc, crutches, bsc, etc Care Coordinator Barriers to Discharge: Behavior, Insurance for SNF coverage Care  Coordinator Anticipated Follow Up Needs: HH/OP  Clinical Impression Pleasant yet perservative pt who is focused on his staple removal. Discussed he is high level and will be a short length of stay. His goals are mod/I -supervision level. Pt is non-compliant with his wound care and continues to smoke. Sister is home with him but has health issues-MS. Pt is high level and will be short length of stay here.  Lucy Chris 12/16/2020, 1:19 PM

## 2020-12-16 NOTE — Progress Notes (Signed)
Inpatient Rehabilitation Center Individual Statement of Services  Patient Name:  Benjamin [name]  Date:  12/16/2020  Welcome to the Inpatient Rehabilitation Center.  Our goal is to provide you with an individualized program based on your diagnosis and situation, designed to meet your specific needs.  With this comprehensive rehabilitation program, you will be expected to participate in at least 3 hours of rehabilitation therapies Monday-Friday, with modified therapy programming on the weekends.  Your rehabilitation program will include the following services:  Physical Therapy (PT), Occupational Therapy (OT), 24 hour per day rehabilitation nursing, Neuropsychology, Care Coordinator, Rehabilitation Medicine, Nutrition Services, and Pharmacy Services  Weekly team conferences will be held on Wednesday to discuss your progress.  Your Inpatient Rehabilitation Care Coordinator will talk with you frequently to get your input and to update you on team discussions.  Team conferences with you and your family in attendance may also be held.  Expected length of stay: 7 days  Overall anticipated outcome: mod/I-supervision level  Depending on your progress and recovery, your program may change. Your Inpatient Rehabilitation Care Coordinator will coordinate services and will keep you informed of any changes. Your Inpatient Rehabilitation Care Coordinator's name and contact numbers are listed  below.  The following services may also be recommended but are not provided by the Inpatient Rehabilitation Center:  Driving Evaluations Home Health Rehabiltiation Services Outpatient Rehabilitation Services    Arrangements will be made to provide these services after discharge if needed.  Arrangements include referral to agencies that provide these services.  Your insurance has been verified to be:  [employer] Your primary doctor is:  Benjamin [name]  Pertinent information will be shared with your doctor and  your insurance company.  Inpatient Rehabilitation Care Coordinator:  Benjamin [name], Benjamin [name] [phone] or Benjamin [name]  Information discussed with and copy given to patient by: Benjamin [name], 12/16/2020, 1:26 PM

## 2020-12-16 NOTE — Progress Notes (Signed)
Occupational Therapy Session Note  Patient Details  Name: Benjamin [name] MRN: [id number] Date of Birth: 03-Dec-1960  Today's Date: 12/16/2020 OT Individual Time: 4970-2637 OT Individual Time Calculation (min): 41 min    Short Term Goals: Week 1:  OT Short Term Goal 1 (Week 1): STG = LTG due to ELOS  Skilled Therapeutic Interventions/Progress Updates:  Skilled OT intervention completed with focus on wound care education and BUE/core strengthening. Pt received seated EOB, agreeable to session. Provided education regarding limb care, skin inspection using mirror and importance of keeping wound clean to prevent infection. Pt receptive, and discussed therapist bringing mirror and educational booklet on amputations to next session. Pt with need to void, opted to use urinal, with independence. Completed BUE/core strengthening while seated EOB with 3 pound dowel including the following:  Bicep flexion 2x15 Chest presses 2 x15 Russian twists (4 pound ball) x20   Verbal cues and demonstration needed for form and technique. Completed sit > stand with min A using RW, stand hop to recliner with CGA and pt demonstrating great BUE control during transfer however required cues for positioning of RW. Discussed positioning techniques to alleviate pain from pressure sore on bottom/pain with residual limb with propping technique using pillow and off loading technique as well as educated on reclining in the chair to lengthen the hip flexors to prevent contractures as pt reports a pulling sensation when hip is straightened even in reclined chair. Pt left with alarm belt on, and all needs in reach at end of session.  Therapy Documentation Precautions:  Restrictions Weight Bearing Restrictions: No LLE Weight Bearing: Non weight bearing  Pain: Pt with 6/10 pain in residual limb- stabbing, intermittent, premedicated. Positioned for comfort with improvement and RN notified for need of pain meds when  due.   Therapy/Group: Individual Therapy  Karel Mowers E Klutz 12/16/2020, 7:28 AM

## 2020-12-16 NOTE — Progress Notes (Signed)
Chaplain responded to consult by pt's nurse. Chaplain introduced spiritual care and offered support. Chaplain asked open ended questions to facilitate story telling and emotional expression. Mr Summons named that his brother was found dead in a hotel room yesterday. He engaged in life review, sharing about all of the stress that his family has been through and working to make sense of his brother's decision to use drugs. Mr Stapel has lost many loved ones in the last few years and also lost his leg. He speculated why it is that some people break under the pressure of stress and difficulty and others go on. Mr Wellen named his spirituality as an aid to coping. He says when he gets overwhelmed he prays for rest and that he'll wake up and "it'll be Sunday." Mr Fairley his hopeful that this second surgery will resolve his infection and heal to completion. He is working on mobility because there is minimal room in the home he shares with his sister for assistive equipment. Chaplain encouraged pt to continue to practice self care and reach out to spiritual care for continual support during his stay.  Please page as further needs arise.  Maryanna Shape. Carley Hammed, M.Div. [facility] Chaplain Pager [phone] Office [phone]

## 2020-12-16 NOTE — Progress Notes (Signed)
Physical Therapy Session Note  Patient Details  Name: NICKALOUS STINGLEY MRN: [id number] Date of Birth: 06/19/1960  Today's Date: 12/16/2020 PT Individual Time: 8144-8185 PT Individual Time Calculation (min): 38 min   Short Term Goals: Week 1:  PT Short Term Goal 1 (Week 1): STG=LTG due to LOS  Skilled Therapeutic Interventions/Progress Updates:   Received pt sitting in recliner, pt agreeable to PT treatment, and reported pain 6/10 in L residual limb (premedicated). Repositioning, rest breaks, and distraction done to reduce pain levels. Session with emphasis on functional mobility/transfers, generalized strengthening, dynamic standing balance/coordination, ambulation, and improved activity tolerance. Sit<>stand with RW and CGA and performed the following exercises standing with BUE support on RW and CGA for balance: -L hip flexion 2x15 -L hip abduction 2x15 -L hip extension 2x15 -R heel raises 2x12 Pt then ambulated 19ft x 2 trials with RW and CGA to/from therapy gym on 5C. Pt performed BUE and RLE strengthening on Nustep at workload 6 decreasing to level 4 for 5 minutes for a total of 177 steps with 1 rest break with emphasis on cardiovascular endurance. Concluded session with pt sitting EOB, needs within reach, and bed alarm on.  Therapy Documentation Precautions:  Restrictions Weight Bearing Restrictions: No LLE Weight Bearing: Non weight bearing   Therapy/Group: Individual Therapy Martin Majestic PT, DPT   12/16/2020, 7:23 AM

## 2020-12-16 NOTE — Patient Care Conference (Signed)
Inpatient RehabilitationTeam Conference and Plan of Care Update Date: 12/16/2020   Time: 11:43 AM    Patient Name: Benjamin [name]      Medical Record Number: [id number]  Date of Birth: 12-Jan-1961 Sex: Male         Room/Bed: 5C18C/5C18C-01 Payor Info: Payor: [place] MEDICAID PREPAID HEALTH PLAN / Plan: [place] MEDICAID HEALTHY BLUE / Product Type: *No Product type* /    Admit Date/Time:  12/15/2020  4:18 PM  Primary Diagnosis:  Unilateral AKA, left [facility])  Hospital Problems: Principal Problem:   Unilateral AKA, left [facility])    Expected Discharge Date: Expected Discharge Date: 12/23/20  Team Members Present: Physician leading conference: Dr. Sula Soda Social Worker Present: Dossie Der, LCSW Nurse Present: Chana Bode, RN PT Present: Raechel Chute, PT OT Present: Other (comment) [facility] Poindexter, OT) PPS Coordinator present : Edson Snowball, PT     Current Status/Progress Goal Weekly Team Focus  Bowel/Bladder   continent of b/b; LBM: 10/23  gain regular bowel pattern  assist with toileting needs prn   Swallow/Nutrition/ Hydration             ADL's   set up- grooming, min A- LB bathing, mod A- LB dressing, set up- B/D UB, min A stand pivot and sit > stand transfers without RW during eval  Mod I-supervision  ADL retraining, AE education, residual limb care, endurance, functional transfers   Mobility   bed mobility supervision, transfers with RW and min A, gait 58ft with RW min A, WC mobility 186ft supervision  Mod I/supervision  functional mobility/transfers, generalized strengthening, dynamic standing balance/coordination, gait training, amputee education, D/C planning, and imporved activity tolerance.   Communication             Safety/Cognition/ Behavioral Observations            Pain   c/o pain to surgical AKA site; prn oxycodone, tramadol and robaxin  manage pain, level <6/10  assess pain QS and prn   Skin   surgical AKA site with dressing in place; dressing order per MD   continue dressing change per MD orders; remain free of new skin breakdown/infection  assess skin QS and prn     Discharge Planning:  Home with sister and sister's family. Sister has MS and is limited but can provide supervision level. Pt fairly high level and will be short length of stay   Team Discussion: Patient with phantom limb pain post AKA. Anemia monitored per MD. DM management ongoing and minimal drainage from incision.  Patient on target to meet rehab goals: Yes, currently min assist stand pivot and stand pivot with a walker with min assist.  Needs set up for lower body bathing and mod assist for dressing due to pain and getting clothes over equipment. Goals for discharge set for mod I assist.  *See Care Plan and progress notes for long and short-term goals.   Revisions to Treatment Plan:    Teaching Needs: Safety, skin care, medications, secondary risk management, transfers, etc  Current Barriers to Discharge: Decreased caregiver support, Home enviroment access/layout, and Weight bearing restrictions  Possible Resolutions to Barriers: Patient has his equipment; BSC and TTB Ramped entry to home     Medical Summary Current Status: phantom limb pain, residual limb drainage, hyperglycemia, low protein levels, insomnia  Barriers to Discharge: Medical stability;Wound care  Barriers to Discharge Comments: phantom limb pain, residual limb drainage, hyperglycemia, low protein levels, insomnia Possible Resolutions to [employer] Focus: start gabapentin 100mg  HS, continue  daily wound care, provided dietary education regarding low sugar and high protein diet given labwork and wound healing   Continued Need for Acute Rehabilitation Level of Care: The patient requires daily medical management by a physician with specialized training in physical medicine and rehabilitation for the following reasons: Direction of a multidisciplinary physical rehabilitation program to maximize functional  independence : Yes Medical management of patient stability for increased activity during participation in an intensive rehabilitation regime.: Yes Analysis of laboratory values and/or radiology reports with any subsequent need for medication adjustment and/or medical intervention. : Yes   I attest that I was present, lead the team conference, and concur with the assessment and plan of the team.   Chana Bode B 12/16/2020, 4:11 PM

## 2020-12-16 NOTE — Progress Notes (Signed)
Initial Nutrition Assessment  DOCUMENTATION CODES:   Severe malnutrition in context of chronic illness, Underweight  INTERVENTION:  - Liberalize diet to regular to encourage PO intake  - Continue Boost Breeze po BID, each supplement provides 250 kcal and 9 grams of protein  - Continue 30 ml ProSource Plus BID, each supplement provides 100 kcals and 15 grams protein.   - Continue MVI with minerals daily  NUTRITION DIAGNOSIS:   Severe Malnutrition related to chronic illness (PAD and wound healing) as evidenced by severe fat depletion, severe muscle depletion.  GOAL:   Patient will meet greater than or equal to 90% of their needs   MONITOR:   PO intake, Supplement acceptance, Labs, Weight trends, Skin  REASON FOR ASSESSMENT:   Other (Comment) (low BMI; history of malnutrition)    ASSESSMENT:   Pt admitted to inpatient rehab with functional decline d/t L AKA. PMH includes PAD s/p multiple BLE bypass grafts, tobacco use, L AKA in April 2022, L AKA revision/application of wound vac on 10/18.  Pt sitting on side of bed ordering dinner during visit. He states that he is trying to eat well and has a good appetite. For breakfast he had eggs, 1 pancake, and grits. For lunch he had spaghetti which he reports only eating 33%. He was ordering an omelette with beans for dinner. Pt reports that he is drinking the Boost and Prosource without any issue. He does not prefer Ensure but aware that if he does not want the Prosource, that Ensure is an option. Spoke with him about the importance of adequate protein and calories to help with wound healing and maintaining muscle mass/strength during rehab.  Meal Completion:  (10/25) 70% x 1 recorded meal (10/26) 100% x 1 recorded meal  Per review of chart, noted weight on 10/18 of 140 lbs and a weight on  10/25 of 115 lbs. This is a 18% weight loss in 1 week which is significant for time frame. This amount of weight loss is questionable but will  continue to monitor trends.  Medications: vitamin c, colace, MVI with minerals, protonix, senokot, zinc  Labs: sodium 133  NUTRITION - FOCUSED PHYSICAL EXAM:  Flowsheet Row Most Recent Value  Orbital Region Severe depletion  Upper Arm Region Severe depletion  Thoracic and Lumbar Region Severe depletion  Buccal Region Severe depletion  Temple Region Severe depletion  Clavicle Bone Region Severe depletion  Clavicle and Acromion Bone Region Severe depletion  Scapular Bone Region Severe depletion  Dorsal Hand Severe depletion  Patellar Region Severe depletion  [L AKA]  Anterior Thigh Region Severe depletion  Posterior Calf Region Severe depletion  Edema (RD Assessment) Mild  Hair Reviewed  Eyes Reviewed  Mouth Reviewed  Skin Reviewed  Nails Reviewed       Diet Order:   Diet Order             Diet regular Room service appropriate? Yes; Fluid consistency: Thin  Diet effective now                   EDUCATION NEEDS:   Education needs have been addressed  Skin:  Skin Assessment: Skin Integrity Issues: Skin Integrity Issues:: Incisions Incisions: L thigh  Last BM:  12/13/20  Height:   Ht Readings from Last 1 Encounters:  12/15/20 5\' 7"  (1.702 m)    Weight:   Wt Readings from Last 1 Encounters:  12/15/20 52.3 kg    BMI:  Body mass index is 18.06 kg/m.  Estimated  Nutritional Needs:   Kcal:  1900-2100  Protein:  95-105g  Fluid:  >1.9L  Drusilla Kanner, RDN, LDN Clinical Nutrition

## 2020-12-16 NOTE — Progress Notes (Signed)
PROGRESS NOTE   Subjective/Complaints: C/o phantom limb pain that is worse at night- started gabapentin 100mg  HS. Asks for a muscle relaxer now and .  Reviewed labs with him  ROS: + residual limb pain   Objective:   No results found. Recent Labs    12/16/20 0518  WBC 6.8  HGB 7.9*  HCT 24.3*  PLT 727*   Recent Labs    12/16/20 0518  NA 133*  K 4.4  CL 98  CO2 25  GLUCOSE 128*  BUN 12  CREATININE 0.75  CALCIUM 9.0    Intake/Output Summary (Last 24 hours) at 12/16/2020 1034 Last data filed at 12/16/2020 0700 Gross per 24 hour  Intake 360 ml  Output 900 ml  Net -540 ml        Physical Exam: Vital Signs Blood pressure 124/75, pulse 79, temperature 98.1 F (36.7 C), temperature source Oral, resp. rate 18, height 5\' 7"  (1.702 m), weight 52.3 kg, SpO2 97 %. Gen: no distress, normal appearing HEENT: oral mucosa pink and moist, NCAT Cardio: Reg rate Pulmonary:     Comments: A little coarse on R side- cleared with coughing- otherwise, no W/R/R Abdominal:     General: Abdomen is flat. Bowel sounds are normal.     Palpations: Abdomen is soft.     Comments: Soft, NT, ND, (+)BS    Musculoskeletal:     Cervical back: Normal range of motion. No rigidity.     Comments: Hight L-AKA with dry dressing. MS: UE strength 5/5; RLE 5/5 LLE- didn't test due to high AKA except could lift against gravity- HF  Skin:    Comments: Pt didn't allow me to assess- since "just walked- is throbbing- asked for pain meds L groin incision assessed - healed by moist- no yeast seen   Neurological:     Mental Status: He is alert and oriented to person, place, and time.     Comments: Intact to light touch in UE and RLE-  Psychiatric:        Mood and Affect: Mood normal.        Behavior: Behavior normal.    Assessment/Plan: 1. Functional deficits which require 3+ hours per day of interdisciplinary therapy in a  comprehensive inpatient rehab setting. Physiatrist is providing close team supervision and 24 hour management of active medical problems listed below. Physiatrist and rehab team continue to assess barriers to discharge/monitor patient progress toward functional and medical goals  Care Tool:  Bathing    Body parts bathed by patient: Right arm, Left arm, Chest, Abdomen, Front perineal area, Buttocks, Right upper leg, Face   Body parts bathed by helper: Right lower leg Body parts n/a: Left upper leg   Bathing assist Assist Level: Minimal Assistance - Patient > 75%     Upper Body Dressing/Undressing Upper body dressing   What is the patient wearing?: Pull over shirt    Upper body assist Assist Level: Set up assist    Lower Body Dressing/Undressing Lower body dressing      What is the patient wearing?: Underwear/pull up, Pants     Lower body assist Assist for lower body dressing: Moderate Assistance - Patient  50 - 74%     Toileting Toileting    Toileting assist Assist for toileting: Moderate Assistance - Patient 50 - 74% Assistive Device Comment: urinal   Transfers Chair/bed transfer  Transfers assist     Chair/bed transfer assist level: Minimal Assistance - Patient > 75%     Locomotion Ambulation   Ambulation assist              Walk 10 feet activity   Assist           Walk 50 feet activity   Assist           Walk 150 feet activity   Assist           Walk 10 feet on uneven surface  activity   Assist           Wheelchair     Assist               Wheelchair 50 feet with 2 turns activity    Assist            Wheelchair 150 feet activity     Assist          Blood pressure 124/75, pulse 79, temperature 98.1 F (36.7 C), temperature source Oral, resp. rate 18, height 5\' 7"  (1.702 m), weight 52.3 kg, SpO2 97 %.  Medical Problem List and Plan: 1.  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  PAD/Antithrombotics: -DVT/anticoagulation:  Pharmaceutical: Lovenox             -antiplatelet therapy: Plavix 3. Phantom limb pain: Start gabapentin 100mg  HS. Oxycodone prn for severe pain and ultram prn for moderate pain 4. Mood: LCSW to follow for evaluation and support.              -antipsychotic agents: N/A 5. Neuropsych: This patient is capable of making decisions on his own behalf. 6. Skin/Wound Care: Monitor wound for healing.   --On Zinc, vitamin C, boost and prosource to promote healing.  7. Fluids/Electrolytes/Nutrition: Monitor I/O.  8.  GERD: Continue Protonix 9. Acute blood loss anemia: Recheck CBC in am.  10 Tobacco abuse: Continue to encourage tobacco cessation.              --nicotine patch to prevent withdrawal.  11. Hyponatremia: Recheck CMET in am  12. Prediabetes: Hgb A1C- 6.0. Add CM restrictions to diet as fasting BS 107-150 range             --Monitor CBGs daily in am.  13. Elevated A phos: Likely due to infection. Recheck LFTs in am.  14. HTN: Cozaar remains on hold.  --Continue Coreg bid   15. Low protein levels: encouraged high protein diet.     LOS: 1 days A FACE TO FACE EVALUATION WAS PERFORMED  Shaleka Brines 12/16/2020, 10:34 AM

## 2020-12-16 NOTE — Evaluation (Signed)
Occupational Therapy Assessment and Plan  Patient Details  Name: Benjamin [name] MRN: [id number] Date of Birth: 10/10/1960  OT Diagnosis: acute pain, cognitive deficits, muscular wasting and disuse atrophy, muscle weakness (generalized), and swelling of limb Rehab Potential: Rehab Potential (ACUTE ONLY): Good ELOS: about 7 days   Today's Date: 12/16/2020 OT Individual Time: 0800-0900 OT Individual Time Calculation (min): 60 min     Hospital Problem: Principal Problem:   Unilateral AKA, left ([place])   Past Medical History:  Past Medical History:  Diagnosis Date   Back pain    Heavy smoker    Hyperlipidemia LDL goal <70    Hypertension    Peripheral arterial occlusive disease ([place]) 06/2013   Bilateral femoral artery disease   Past Surgical History:  Past Surgical History:  Procedure Laterality Date   ABDOMINAL AORTOGRAM W/LOWER EXTREMITY N/A 07/12/2016   Procedure: Abdominal Aortogram w/Lower Extremity;  Surgeon: Serafina Mitchell, MD;  Location: [place] [facility];  Service: Cardiovascular;  Laterality: N/A;  Bilateral extermity: Patent Renal As. No sig Dz in infrarenal Abd Aorta. Normal Bilat Iliac arteries. R SFA is 100% @ origin - recon in AK-Pop A. R PT A patent. L CFA occluded. L PFA recon @ origin, L SFA occluded w/ recon in AK Pop A.   ABDOMINAL AORTOGRAM W/LOWER EXTREMITY N/A 10/15/2019   Procedure: ABDOMINAL AORTOGRAM W/LOWER EXTREMITY;  Surgeon: Serafina Mitchell, MD;  Location: [place] [facility];  Service: Cardiovascular;  Laterality: N/A;   AMPUTATION Left 10/04/2020   Procedure: LEFT ABOVE KNEE AMPUTATION;  Surgeon: Serafina Mitchell, MD;  Location: [place];  Service: Vascular;  Laterality: Left;   AMPUTATION Left 12/08/2020   Procedure: RIGHT ABOVE KNEE AMPUTATION REVISION;  Surgeon: Angelia Mould, MD;  [facility];  Service: Vascular;  Laterality: Left;   AORTA - BILATERAL FEMORAL ARTERY BYPASS GRAFT N/A 05/08/2020   Procedure: AORTA BIFEMORAL BYPASS GRAFT;   Surgeon: Serafina Mitchell, MD;  [facility];  Service: Vascular;  Laterality: N/A;   COLONOSCOPY  06/2016   never   ENDARTERECTOMY FEMORAL Left 11/14/2019   Procedure: Left groin exploration, Redo left femoral artery exposure;  Surgeon: Serafina Mitchell, MD;  [facility];  Service: Vascular;  Laterality: Left;   FEMORAL-FEMORAL BYPASS GRAFT N/A 11/13/2019   Procedure: BYPASS GRAFT FEMORAL-FEMORAL ARTERY RIGHT TO LEFT USING HEMASHIELD GOLD GRAFT 56m x 30cm;  Surgeon: BSerafina Mitchell MD;  [facility];  Service: Vascular;  Laterality: N/A;   FEMORAL-POPLITEAL BYPASS GRAFT Left 11/13/2019   Procedure: LEFT FEMORAL BELOW KNEE-POPLITEAL ARTERY USING NON-REVERSED GREATER SAPHENOUS VEIN;  Surgeon: BSerafina Mitchell MD;  [facility];  Service: Vascular;  Laterality: Left;   FEMORAL-POPLITEAL BYPASS GRAFT Left 05/08/2020   Procedure: LEFT REDO BYPASS GRAFT COMMON FEMORAL- BELOW KNEE POPLITEAL ARTERY USING PROPATEN GRAFT;  Surgeon: BSerafina Mitchell MD;  [facility];  Service: Vascular;  Laterality: Left;   I & D EXTREMITY Left 09/07/2020   Procedure: IRRIGATION AND DEBRIDEMENT LEFT LEG WITH EXCISION OF LEFT LEG DISTAL BYPASS GRAFT;  Surgeon: CMarty Heck MD;  [place]  Service: Vascular;  Laterality: Left;   LOWER EXTREMITY ANGIOGRAM Left 11/14/2019   Procedure: LEFT LOWER EXTREMITY ANGIOGRAM, BYPASS GRAFT ANGIOPLASTY;  Surgeon: BSerafina Mitchell MD;  [facility];  Service: Vascular;  Laterality: Left;   PERIPHERAL VASCULAR INTERVENTION  10/15/2019   Procedure: PERIPHERAL VASCULAR INTERVENTION;  Surgeon: BSerafina Mitchell MD;  [facility] [facility];  Service: Cardiovascular;;  Rt Iliac   REMOVAL OF GRAFT Left 10/04/2020   Procedure: REMOVAL OF LEFT FEMORAL TO POPITEAL BYPASS GRAFT;  Surgeon: Serafina Mitchell, MD;  [facility];  Service: Vascular;  Laterality: Left;    Assessment & Plan Clinical Impression:  Pt is 60 y/o M admitted 12/07/20 with  necrotic left above-knee amputation site. 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 Patient transferred to CIR on 12/15/2020 .    Patient currently requires min-mod A with basic self-care skills secondary to muscle weakness, decreased cardiorespiratoy endurance, decreased motor planning, and decreased problem solving.  Prior to hospitalization, patient could complete basic ADLs with modified independence.  Patient will benefit from skilled intervention to decrease level of assist with basic self-care skills, increase independence with basic self-care skills, and increase level of independence with iADL prior to discharge with sister.  Anticipate patient will require intermittent supervision and no further OT follow recommended.  OT - End of Session Activity Tolerance: Tolerates 30+ min activity with multiple rests Endurance Deficit: Yes OT Assessment Rehab Potential (ACUTE ONLY): Good OT Barriers to Discharge: Home environment access/layout;Decreased caregiver support;Wound Care;Lack of/limited family support OT Patient demonstrates impairments in the following area(s): Balance;Cognition;Edema;Endurance;Motor;Pain;Safety;Skin Integrity;Vision OT Basic ADL's Functional Problem(s): Grooming;Bathing;Dressing;Toileting OT Advanced ADL's Functional Problem(s): Simple Meal Preparation OT Transfers Functional Problem(s): Toilet;Tub/Shower OT Additional Impairment(s): None OT Plan OT Intensity: Minimum of 1-2 x/day, 45 to 90 minutes OT Frequency: 5 out of 7 days OT Duration/Estimated Length of Stay: about 7 days OT Treatment/Interventions: Balance/vestibular training;Discharge planning;Pain management;Self Care/advanced ADL retraining;Therapeutic Activities;Disease mangement/prevention;Functional mobility training;Patient/family education;Skin care/wound managment;Therapeutic  Exercise;Community reintegration;DME/adaptive equipment instruction;Psychosocial support;UE/LE Strength taining/ROM;Wheelchair propulsion/positioning OT Self Feeding Anticipated Outcome(s): Mod I OT Basic Self-Care Anticipated Outcome(s): Mod I OT Toileting Anticipated Outcome(s): Mod I-supervision OT Bathroom Transfers Anticipated Outcome(s): Supervision OT Recommendation Patient destination: Home Follow Up Recommendations: None;Other (comment) (intermittent supervision) Equipment Recommended: To be determined   OT Evaluation Skilled OT intervention completed with purpose of OT, rehab process, and POC provided. Pt received EOB in side lean with head at foot of the bed and RLE hanging over side of bed, groggy but easy to arouse. Pt agreeable to session. While EOB completed UB bathing/dressing with set up assist, with min A needed for LB bathing for RLE and mod A for LB dressing with pt requiring assist to thread BLE into under/wear pants and complete donning of socks at total assist. Pt completed lateral leans for threading of residual limb through clothing, and able to complete sit > stand with min A without RW and bed rail for pushing up, however pt with great strength and balance but would utilize RW in future sessions. Reports wearing dentures normally, however doesn't have any in his possession. Completed grooming tasks with set up assist at EOB. Completed stand pivot transfer with min A to w/c. Pt left seated in w/c in a position of comfort, with alarm belt on and all needs in reach at end of session.   Precautions/Restrictions  Precautions Precautions: Fall Precaution Comments: Monitor for pain on L AKA, HR (runs high) Restrictions Weight Bearing Restrictions: Yes LLE Weight Bearing: Non weight bearing Pain Pain Assessment Pain Scale: 0-10 Pain Score: 6  Pain Type: Acute pain Pain Location: Leg Pain Orientation: Left Pain Intervention(s): Medication (See eMAR) Home Living/Prior  Functioning Home Living Family/patient expects to be discharged to:: Private residence Living  Arrangements: Other relatives, Other (Comment) Available Help at Discharge: Family, Available 24 hours/day Type of Home: House Home Access: Stairs to enter [employer] of Steps: 4 Entrance Stairs-Rails: Left, Right (reports not being able to reach both at same time) Home Layout: One level Bathroom Shower/Tub: Chiropodist: Standard Bathroom Accessibility: No Additional Comments: Lives with sister who has MS, she uses walker to ambulate and also has w/c per pt report. Her son and his girlfirend also live there but do not help him much. 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 Unclear on what IADLs pt was completing Prior Function Level of Independence: Requires assistive device for independence, Independent with basic ADLs, Independent with transfers, Independent with homemaking with wheelchair  Able to Take Stairs?:  (pt reports using RW on stairs) Driving: No Vocation: On disability Leisure: Hobbies-yes (Comment) Vision Baseline Vision/History: 0 No visual deficits Ability to See in Adequate Light: 0 Adequate Patient Visual Report: No change from baseline Vision Assessment?: No apparent visual deficits Perception  Perception: Within Functional Limits Praxis Praxis: Intact Cognition Overall Cognitive Status: Within Functional  Limits for tasks assessed Arousal/Alertness: Awake/alert Orientation Level: Person;Place;Situation Person: Oriented Place: Oriented Situation: Oriented Year: 2022 Month: October Day of Week: Correct Memory: Appears intact Immediate Memory Recall: Blue;Bed Memory Recall Sock: With Cue Memory Recall Blue: Without Cue Memory Recall Bed: Without Cue Attention: Focused;Divided Focused Attention: Appears intact Divided Attention: Impaired Awareness: Appears intact Problem Solving: Impaired Safety/Judgment: Appears intact Sensation Sensation Light Touch: Appears Intact Proprioception: Appears Intact Additional Comments: decreased sensation along L residual limb Coordination Gross Motor Movements are Fluid and Coordinated: Yes Fine Motor Movements are Fluid and Coordinated: No Coordination and Movement Description: uncoordinated due to pain, decreased balance/postural control, altered balance strategies, and generalized weakness/deconditioning Finger Nose Finger Test: dysmetria bilaterally and slow, as well as decreased precision with finger opposition Heel Shin Test: WFL on RLE, unable to perform on LLE due to AKA and pain Motor  Motor Motor: Abnormal postural alignment and control Motor - Skilled Clinical Observations: uncoordinated due to pain, decreased balance/postural control, altered balance strategies, and generalized weakness/deconditioning  Trunk/Postural Assessment  Cervical Assessment Cervical Assessment: Exceptions to [facility] (forward head) Thoracic Assessment Thoracic Assessment: Exceptions to [facility] (kyphosis) Lumbar Assessment Lumbar Assessment: Exceptions to [facility] (posterior pelvic tilt) Postural Control Postural Control: Deficits on evaluation (altered balance strategies due to L AKA)  Balance Balance Balance Assessed: Yes Static Sitting Balance Static Sitting - Balance Support: Feet supported;No upper extremity supported Static Sitting - Level of Assistance: 7:  Independent Dynamic Sitting Balance Dynamic Sitting - Balance Support: Feet supported;No upper extremity supported Dynamic Sitting - Level of Assistance: 6: Modified independent (Device/Increase time) Static Standing Balance Static Standing - Balance Support: Bilateral upper extremity supported (RW) Static Standing - Level of Assistance: 5: Stand by assistance (CGA) Dynamic Standing Balance Dynamic Standing - Balance Support: Bilateral upper extremity supported (RW) Dynamic Standing - Level of Assistance: 4: Min assist Extremity/Trunk Assessment RUE Assessment RUE Assessment: Within Functional Limits General Strength Comments: 4+/5 grossly,  R>L LUE Assessment LUE Assessment: Within Functional Limits General Strength Comments: 4/5 grossly, L<R  Care Tool Care Tool Self Care Eating   Eating Assist Level: Set up assist    Oral Care    Oral Care Assist Level: Set up assist    Bathing   Body parts bathed by patient: Right arm;Left arm;Chest;Abdomen;Front perineal area;Buttocks;Right upper leg;Face Body parts bathed by helper: Right lower leg Body parts n/a: Left upper leg Assist Level: Minimal Assistance - Patient > 75%    Upper Body Dressing(including orthotics)   What is the patient wearing?: Pull over shirt   Assist Level: Set up assist    Lower Body Dressing (excluding footwear)   What is the patient wearing?: Underwear/pull up;Pants Assist for lower body dressing: Moderate Assistance - Patient 50 - 74%    Putting on/Taking off footwear   What is the patient wearing?: Non-skid slipper socks Assist for footwear: Total Assistance - Patient < 25%       Care Tool Toileting Toileting activity   Assist for toileting: Moderate Assistance - Patient 50 - 74%     Care Tool Bed Mobility Roll left and right activity   Roll left and right assist level: Supervision/Verbal cueing    Sit to lying activity   Sit to lying assist level: Supervision/Verbal cueing    Lying to  sitting on side of bed activity   Lying to sitting on side of bed assist level: the ability to move from lying on the back to sitting on the side of the bed with no back support.: Supervision/Verbal cueing     Care Tool Transfers Sit to stand transfer   Sit to stand assist level: Minimal Assistance - Patient > 75%    Chair/bed transfer   Chair/bed transfer assist level: Minimal Assistance - Patient > 75%     Toilet transfer   Assist Level: Minimal Assistance - Patient > 75%     Care Tool Cognition  Expression of Ideas and Wants Expression of Ideas and Wants: 3. Some difficulty - exhibits some difficulty with expressing needs and ideas (e.g, some words or finishing thoughts) or speech is not clear  Understanding Verbal and Non-Verbal Content Understanding Verbal and Non-Verbal Content: 3. Usually understands - understands most conversations, but misses some part/intent of message. Requires cues at times to understand   Memory/Recall Ability Memory/Recall Ability : Current season;That he or she is in a hospital/hospital unit   Refer to Care Plan for [place] 1 OT Short Term Goal 1 (Week 1): STG = LTG due to ELOS  Recommendations for other services: None    Skilled Therapeutic Intervention ADL ADL Grooming: Setup Where Assessed-Grooming: Edge of bed Upper Body Bathing: Setup Where Assessed-Upper Body Bathing: Edge of bed Lower Body Bathing: Minimal assistance Where Assessed-Lower Body Bathing: Edge of bed Upper Body Dressing: Setup Where Assessed-Upper Body Dressing: Edge of bed Lower Body Dressing: Moderate assistance Where Assessed-Lower Body Dressing: Edge of bed Toileting: Moderate assistance Where Assessed-Toileting: Bedside Commode Toilet Transfer: Minimal assistance Toilet Transfer Method: Stand pivot Toilet Transfer Equipment: Bedside commode (RW) Mobility  Bed Mobility Bed Mobility: Rolling Right;Rolling Left;Sit to Supine;Supine to  Sit Rolling Right: Supervision/verbal cueing Rolling Left: Supervision/Verbal cueing Supine to Sit: Supervision/Verbal cueing Sit to Supine: Supervision/Verbal cueing Transfers Sit to Stand: Minimal Assistance - Patient > 75% Stand to Sit: Contact Guard/Touching assist   Discharge Criteria: Patient will be discharged from OT if patient refuses treatment 3 consecutive times  without medical reason, if treatment goals not met, if there is a change in medical status, if patient makes no progress towards goals or if patient is discharged from hospital.  The above assessment, treatment plan, treatment alternatives and goals were discussed and mutually agreed upon: by patient  Haywood Lasso 12/16/2020, 9:31 AM

## 2020-12-17 ENCOUNTER — Other Ambulatory Visit: Payer: Self-pay

## 2020-12-17 ENCOUNTER — Other Ambulatory Visit: Payer: Self-pay | Admitting: Physician Assistant

## 2020-12-17 DIAGNOSIS — S78112A Complete traumatic amputation at level between left hip and knee, initial encounter: Secondary | ICD-10-CM | POA: Diagnosis not present

## 2020-12-17 MED ORDER — GABAPENTIN 100 MG PO CAPS
200.0000 mg | ORAL_CAPSULE | Freq: Every day | ORAL | Status: DC
  Administered 2020-12-17: 200 mg via ORAL
  Filled 2020-12-17: qty 2

## 2020-12-17 NOTE — Progress Notes (Addendum)
Patient ID: Benjamin [name], male   DOB: 10-May-1960, 60 y.o.   MRN: [id number] Met with the patient to introduce self and role of the nurse care manager. Reviewed current medical status, plan of care and discharge process. Discussed pain management and secondary risk management including HLD, HTN, prediabetes and smoking cessation. Also reviewed dietary modifications for protein and magnesium. Continue to follow along to discharge to address educational needs and review dressing change for discharge. Provided information on ESBL as well. No other concerns noted at present. [name]

## 2020-12-17 NOTE — Progress Notes (Signed)
Occupational Therapy Session Note  Patient Details  Name: Benjamin [name] MRN: [id number] Date of Birth: 03/08/1960  Today's Date: 12/17/2020 OT Individual Time: 6815-9470 OT Individual Time Calculation (min): 58 min    Short Term Goals: Week 1:  OT Short Term Goal 1 (Week 1): STG = LTG due to ELOS  Skilled Therapeutic Interventions/Progress Updates:  Pt greeted  seated EOB reporting little sleep but agreeable to OT intervention. Session focus on  BADL reeducation and ADL transfers. Pt completed bathing from EOB with supervision assist. Pt able to sit<>stand with supervision to wash anterior/posterior periarea. Pt completed dressing with supervision able to don pants via sit<>stand with rw. Pt completed oral care with set- up assist EOB. Pt completed toileting via sit<>stand with urinal with supervision. Pt completed stand pivot transfer from EOB>wc with Rw and supervision. Pt transported to tub room with total A to practice stand pivot from w/c>TTB. Pt completed transfer with S. Verbal cues for sequencing. Pt completed stand pivot from TTB>w/c with supervision where pt was transported back to room with total A. Pt completed ambulatory transfer back to recliner with rw and Supervision, ~ 5 ft.  pt left  seated in recliner with alarm belt activated and all needs within reach.                    Therapy Documentation Precautions:  Precautions Precautions: Fall Precaution Comments: pain, monitor HR Restrictions Weight Bearing Restrictions: Yes LLE Weight Bearing: Non weight bearing  Pain: pt reports unrated phantom pain in LLE, offered rest breaks, repositioning and education on desensitization techniques.     Therapy/Group: Individual Therapy  Pollyann Glen [facility] 12/17/2020, 9:56 AM

## 2020-12-17 NOTE — Progress Notes (Signed)
Physical Therapy Session Note  Patient Details  Name: Benjamin [name] MRN: [id number] Date of Birth: [birth year]  Today's Date: 12/17/2020 PT Individual Time: 1000-1054 and 9211-9417 PT Individual Time Calculation (min): 54 min and 70 min  Short Term Goals: Week 1:  PT Short Term Goal 1 (Week 1): STG=LTG due to LOS  Skilled Therapeutic Interventions/Progress Updates:   Treatment Session 1 Received pt sitting in recliner, pt agreeable to PT treatment, and reported pain 5/10 in L residual limb (premedicated). Repositioning, rest breaks, and distraction done to reduce pain levels. Session with emphasis on functional mobility/transfers, generalized strengthening, dynamic standing balance/coordination, simulated car transfers, and improved activity tolerance. Donned R shoe with max A and transferred recliner<>WC stand<>pivot with RW and CGA. Pt performed WC mobility 187ft x 1 and 67ft x 1 using BUE and supervision to 41M ortho gym including navigating on/off elevator. Pt performed ambulatory simulated car transfer with RW and CGA and ambulated 81ft on uneven surfaces (ramp) with RW and CGA - cues for control when descending. Stand<>pivot WC<>mat with RW and CGA and sit<>prone with supervision and cues for technique as pt initially lying halfway off mat. Of note, pt with significant L hip flexion contracture and difficulty getting hips flat on mat despite verbal/visual cues - also limited by pain. Educated pt on importance of lying in prone to stretch hip flexors to prepare for prosthetic fitting and pt verbalized understanding but will need further reinforcement due to poor carry over. Pt performed the following exercises with emphasis on LE strength and ROM while stretching hip flexors: -prone L hip extension 2x10 -prone R hamstring curls 2x10 with 3lb ankle weight  -prone L hip abduction 2x10 Prone<>sitting EOM with supervision and stand<>pivot transfer mat<>WC without AD and CGA. Pt transported back to  room in [facility] total A and ambulated 75ft with RW and CGA to recliner. Pt requested to sit up - therapist encouraged pt to return to bed in ~1hr to do more stretching - NT made aware. Concluded session with pt reclined in recliner (to stretch L hip), needs within reach, and chair pad alarm on.   Treatment Session 2 Received pt sitting upright in bed, pt agreeable to PT treatment, and reported pain 6/10 in L residual limb (premedicated). Repositioning, rest breaks, and distraction done to reduce pain levels. Session with emphasis on functional mobility/transfers, generalized strengthening, dynamic standing balance/coordination, community navigation, gait training, and improved activity tolerance. Pt transferred long sitting<>sitting EOB with supervision and donned R shoe with supervision. Sit<>stands with RW and CGA throughout session. Pt ambulated 65ft with RW and CGA to WC. Pt requested to go to vending machine and transported there in WC dependently. Pt required assist to select items and pay due to mild difficulty with problem solving. Pt then transported outside to entrance of [facility] in WC dependently for "fresh air" and to uplift mood. Pt performed WC mobility 175ft using BUE and supervision over concrete and up/down mild slopes. Pt then ambulated 50ft with RW and CGA around reflection fountain with no LOB and good foot clearance on RLE.  Pt transported back to [facility] and transferred onto Nustep with RW and CGA. Pt performed BUE and RLE strengthening on Nustep at workload 5 for 8.5 minutes for a total of 332 steps with emphasis on cardiovascular endurance. Pt ambulated 61ft with RW and CGA back to room and requested to sit in recliner. Discussed concerns regarding D/C and pt reported his only concern is the healing of his L residual limb and  wanted to ensure staples are removed prior to D/C. Also discussed desensitization strategies including tapping, rubbing, massage. Concluded session with pt sitting in recliner,  needs within reach, and chair pad alarm on.   Therapy Documentation Precautions:  Precautions Precautions: Fall Precaution Comments: pain, monitor HR Restrictions Weight Bearing Restrictions: Yes LLE Weight Bearing: Non weight bearing  Therapy/Group: Individual Therapy Martin Majestic PT, DPT   12/17/2020, 7:22 AM

## 2020-12-17 NOTE — Progress Notes (Signed)
PROGRESS NOTE   Subjective/Complaints: Continues to have phantom limb pain- worst at night, discussed plan to increase gabapentin t 200mg  HS He does find the muscle relaxers helpful.    ROS: + residual limb pain, +insomnia   Objective:   No results found. Recent Labs    12/16/20 0518  WBC 6.8  HGB 7.9*  HCT 24.3*  PLT 727*   Recent Labs    12/16/20 0518  NA 133*  K 4.4  CL 98  CO2 25  GLUCOSE 128*  BUN 12  CREATININE 0.75  CALCIUM 9.0    Intake/Output Summary (Last 24 hours) at 12/17/2020 12/19/2020 Last data filed at 12/17/2020 0818 Gross per 24 hour  Intake 560 ml  Output 1475 ml  Net -915 ml        Physical Exam: Vital Signs Blood pressure (!) 129/91, pulse 80, temperature 98.2 F (36.8 C), temperature source Oral, resp. rate 18, height 5\' 7"  (1.702 m), weight 52.3 kg, SpO2 100 %. Gen: no distress, normal appearing HEENT: oral mucosa pink and moist, NCAT Cardio: Reg rate Pulmonary:     Comments: A little coarse on R side- cleared with coughing- otherwise, no W/R/R Abdominal:     General: Abdomen is flat. Bowel sounds are normal.     Palpations: Abdomen is soft.     Comments: Soft, NT, ND, (+)BS    Musculoskeletal:     Cervical back: Normal range of motion. No rigidity.     Comments: Hight L-AKA with dry dressing. MS: UE strength 5/5; RLE 5/5 LLE- didn't test due to high AKA except could lift against gravity- HF  Skin:    Comments: Pt didn't allow me to assess- since "just walked- is throbbing- asked for pain meds L groin incision assessed - healed by moist- no yeast seen   Neurological:     Mental Status: He is alert and oriented to person, place, and time.     Comments: Intact to light touch in UE and RLE-  Psychiatric:        Mood and Affect: Mood normal.        Behavior: Behavior normal.    Assessment/Plan: 1. Functional deficits which require 3+ hours per day of interdisciplinary  therapy in a comprehensive inpatient rehab setting. Physiatrist is providing close team supervision and 24 hour management of active medical problems listed below. Physiatrist and rehab team continue to assess barriers to discharge/monitor patient progress toward functional and medical goals  Care Tool:  Bathing    Body parts bathed by patient: Right arm, Left arm, Chest, Abdomen, Front perineal area, Buttocks, Right upper leg, Face   Body parts bathed by helper: Right lower leg Body parts n/a: Left upper leg   Bathing assist Assist Level: Minimal Assistance - Patient > 75%     Upper Body Dressing/Undressing Upper body dressing   What is the patient wearing?: Pull over shirt    Upper body assist Assist Level: Set up assist    Lower Body Dressing/Undressing Lower body dressing      What is the patient wearing?: Underwear/pull up     Lower body assist Assist for lower body dressing: Moderate Assistance - Patient  50 - 74%     Toileting Toileting    Toileting assist Assist for toileting: Independent with assistive device Assistive Device Comment: Urinal   Transfers Chair/bed transfer  Transfers assist     Chair/bed transfer assist level: Minimal Assistance - Patient > 75%     Locomotion Ambulation   Ambulation assist      Assist level: Minimal Assistance - Patient > 75% Assistive device: Walker-rolling Max distance: 28ft   Walk 10 feet activity   Assist     Assist level: Minimal Assistance - Patient > 75% Assistive device: Walker-rolling   Walk 50 feet activity   Assist Walk 50 feet with 2 turns activity did not occur: Safety/medical concerns (fatigue, pain, decreased balance, weakness/deconditioning)         Walk 150 feet activity   Assist Walk 150 feet activity did not occur: Safety/medical concerns (fatigue, pain, decreased balance, weakness/deconditioning)         Walk 10 feet on uneven surface  activity   Assist Walk 10 feet  on uneven surfaces activity did not occur: Safety/medical concerns (fatigue, pain, decreased balance, weakness/deconditioning)         Wheelchair     Assist Is the patient using a wheelchair?: Yes Type of Wheelchair: Manual    Wheelchair assist level: Supervision/Verbal cueing Max wheelchair distance: 122ft    Wheelchair 50 feet with 2 turns activity    Assist        Assist Level: Supervision/Verbal cueing   Wheelchair 150 feet activity     Assist      Assist Level: Supervision/Verbal cueing   Blood pressure (!) 129/91, pulse 80, temperature 98.2 F (36.8 C), temperature source Oral, resp. rate 18, height 5\' 7"  (1.702 m), weight 52.3 kg, SpO2 100 %.  Medical Problem List and Plan: 1.  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  PAD/Antithrombotics: -DVT/anticoagulation:  Pharmaceutical: Lovenox             -antiplatelet therapy: Plavix 3. Phantom limb pain: Increase gabapentin to 200mg  HS. Oxycodone prn for severe pain and ultram prn for moderate pain 4. Mood: LCSW to follow for evaluation and support.              -antipsychotic agents: N/A 5. Neuropsych: This patient is capable of making decisions on his own behalf. 6. Skin/Wound Care: Monitor wound for healing.   --On Zinc, vitamin C, boost and prosource to promote healing.  7. Fluids/Electrolytes/Nutrition: Monitor I/O.  8.  GERD: Continue Protonix 9. Acute blood loss anemia: Recheck CBC in am.  10 Tobacco abuse: Continue to encourage tobacco cessation.              --continue nicotine patch to prevent withdrawal.  11. Hyponatremia: Recheck CMET in am  12. Prediabetes: Hgb A1C- 6.0. Add CM restrictions to diet as fasting BS 107-150 range             --Monitor CBGs daily in am.  13. Elevated A phos: Likely due to infection. Recheck LFTs in am.  14. HTN: Cozaar  remains on hold.  --Continue Coreg bid   15. Low protein levels: encouraged high protein diet.     LOS: 2 days A FACE TO FACE EVALUATION WAS PERFORMED  Clint Bolder P Shariq Puig 12/17/2020, 9:28 AM

## 2020-12-18 ENCOUNTER — Telehealth: Payer: Self-pay | Admitting: Licensed Clinical Social Worker

## 2020-12-18 MED ORDER — GABAPENTIN 300 MG PO CAPS
300.0000 mg | ORAL_CAPSULE | Freq: Every day | ORAL | Status: DC
  Administered 2020-12-18 – 2020-12-21 (×4): 300 mg via ORAL
  Filled 2020-12-18 (×4): qty 1

## 2020-12-18 NOTE — IPOC Note (Signed)
Overall Plan of Care [facility]) Patient Details Name: Benjamin [name] MRN: [id number] DOB: 08/30/1960  Admitting Diagnosis: Unilateral AKA, left [facility])  Hospital Problems: Principal Problem:   Unilateral AKA, left (HCC)     Functional Problem List: Nursing Bowel, Medication Management, Safety, Skin Integrity, Endurance, Pain  PT Balance, Endurance, Nutrition, Pain, Perception, Sensory, Skin Integrity  OT Balance, Cognition, Edema, Endurance, Motor, Pain, Safety, Skin Integrity, Vision  SLP    TR         Basic ADL's: OT Grooming, Bathing, Dressing, Toileting     Advanced  ADL's: OT Simple Meal Preparation     Transfers: PT Bed Mobility, Bed to Chair, Car, Occupational psychologist, Research scientist (life sciences): PT Ambulation, Psychologist, prison and probation services, Stairs     Additional Impairments: OT None  SLP        TR      Anticipated Outcomes Item Anticipated Outcome  Self Feeding Mod I  Swallowing      Basic self-care  Mod I  Toileting  Mod I-supervision   Bathroom Transfers Supervision  Bowel/Bladder  manage bowel w mod I assist  Transfers  Mod I  Locomotion  Mod I  Communication     Cognition     Pain  at or below level 4  Safety/Judgment  maintain w cues/reminders   Therapy Plan: PT Intensity: Minimum of 1-2 x/day ,45 to 90 minutes PT Frequency: 5 out of 7 days PT Duration Estimated Length of Stay: 7-10 days OT Intensity: Minimum of 1-2 x/day, 45 to 90 minutes OT Frequency: 5 out of 7 days OT Duration/Estimated Length of Stay: about 7 days     Due to the current state of emergency, patients may not be receiving their 3-hours of Medicare-mandated therapy.   Team Interventions: Nursing Interventions Disease Management/Prevention, Medication Management, Skin Care/Wound Management, Pain Management, Bowel Management, Patient/Family Education  PT interventions Ambulation/gait training, Discharge planning, Functional mobility training, Therapeutic Activities,  Visual/perceptual remediation/compensation, Balance/vestibular training, Disease management/prevention, Neuromuscular re-education, Skin care/wound management, Therapeutic Exercise, Wheelchair propulsion/positioning, Cognitive remediation/compensation, DME/adaptive equipment instruction, Pain management, Splinting/orthotics, UE/LE Strength taining/ROM, Community reintegration, Equities trader education, Museum/gallery curator, UE/LE Coordination activities  OT Interventions Warden/ranger, Discharge planning, Pain management, Self Care/advanced ADL retraining, Therapeutic Activities, Disease mangement/prevention, Functional mobility training, Patient/family education, Skin care/wound managment, Therapeutic Exercise, Community reintegration, Fish farm manager, Psychosocial support, UE/LE Strength taining/ROM, Wheelchair propulsion/positioning  SLP Interventions    TR Interventions    SW/CM Interventions Discharge Planning, Psychosocial Support, Patient/Family Education   Barriers to Discharge MD  Medical stability  Nursing Decreased caregiver support, Home environment access/layout, Weight bearing restrictions 1 level 4ste/ramped w sister to provide supervision  PT Decreased caregiver support, Wound Care pain, limited caregiver support as pt's sister has MS and cannot provide physical assist.  OT Home environment access/layout, Decreased caregiver support, Wound Care, Lack of/limited family support    SLP      SW Behavior, Insurance for SNF coverage     Team Discharge Planning: Destination: PT-Home ,OT- Home , SLP-  Projected Follow-up: PT-Outpatient PT, OT-  None, Other (comment) (intermittent supervision), SLP-  Projected Equipment Needs: PT-To be determined, OT- To be determined, SLP-  Equipment Details: PT-has RW, WC, and crutches, OT-  Patient/family involved in discharge planning: PT- Patient,  OT-Patient, SLP-   MD ELOS: 7 days Medical Rehab Prognosis:   Excellent Assessment: Benjamin [name] is a 60 year old man admitted to CIR with 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 Medications are  being managed, and labs and vitals are being monitored regularly.     See Team Conference Notes for weekly updates to the plan of care

## 2020-12-18 NOTE — Progress Notes (Signed)
PROGRESS NOTE   Subjective/Complaints: Continues to have phantom pain and insomnia at night- discussed with him increasing his Gabapentin to 300mg  and he is agreeable. Sister asks whether d/c can be extended to 11/3- will determine based on whether or nor patient would like to attend his brother's funeral.    ROS: + residual limb pain, +insomnia, +phantom limb pain   Objective:   No results found. Recent Labs    12/16/20 0518  WBC 6.8  HGB 7.9*  HCT 24.3*  PLT 727*   Recent Labs    12/16/20 0518  NA 133*  K 4.4  CL 98  CO2 25  GLUCOSE 128*  BUN 12  CREATININE 0.75  CALCIUM 9.0    Intake/Output Summary (Last 24 hours) at 12/18/2020 1329 Last data filed at 12/18/2020 1300 Gross per 24 hour  Intake 1280 ml  Output 1600 ml  Net -320 ml        Physical Exam: Vital Signs Blood pressure (!) 136/91, pulse 93, temperature 98.4 F (36.9 C), temperature source Oral, resp. rate 18, height 5\' 7"  (1.702 m), weight 52.3 kg, SpO2 99 %. Gen: no distress, normal appearing, watching sports HEENT: oral mucosa pink and moist, NCAT Cardio: Reg rate Pulmonary:     Comments: A little coarse on R side- cleared with coughing- otherwise, no W/R/R Abdominal:     General: Abdomen is flat. Bowel sounds are normal.     Palpations: Abdomen is soft.     Comments: Soft, NT, ND, (+)BS    Musculoskeletal:     Cervical back: Normal range of motion. No rigidity.     Comments: Hight L-AKA with dry dressing. MS: UE strength 5/5; RLE 5/5 LLE- didn't test due to high AKA except could lift against gravity- HF  Skin:    Comments: Pt didn't allow me to assess- since "just walked- is throbbing- asked for pain meds L groin incision assessed - healed by moist- no yeast seen   Neurological:     Mental Status: He is alert and oriented to person, place, and time.     Comments: Intact to light touch in UE and RLE-  Psychiatric:        Mood  and Affect: Mood normal.        Behavior: Behavior normal.    Assessment/Plan: 1. Functional deficits which require 3+ hours per day of interdisciplinary therapy in a comprehensive inpatient rehab setting. Physiatrist is providing close team supervision and 24 hour management of active medical problems listed below. Physiatrist and rehab team continue to assess barriers to discharge/monitor patient progress toward functional and medical goals  Care Tool:  Bathing    Body parts bathed by patient: Right arm, Left arm, Chest, Abdomen, Front perineal area, Buttocks, Right upper leg, Face, Left upper leg, Right lower leg   Body parts bathed by helper: Right lower leg Body parts n/a: Left upper leg   Bathing assist Assist Level: Supervision/Verbal cueing     Upper Body Dressing/Undressing Upper body dressing   What is the patient wearing?: Pull over shirt    Upper body assist Assist Level: Supervision/Verbal cueing    Lower Body Dressing/Undressing Lower body dressing  What is the patient wearing?: Underwear/pull up, Pants     Lower body assist Assist for lower body dressing: Supervision/Verbal cueing     Toileting Toileting    Toileting assist Assist for toileting: Supervision/Verbal cueing Assistive Device Comment: BSC   Transfers Chair/bed transfer  Transfers assist     Chair/bed transfer assist level: Contact Guard/Touching assist     Locomotion Ambulation   Ambulation assist      Assist level: Contact Guard/Touching assist Assistive device: Walker-rolling Max distance: 33ft   Walk 10 feet activity   Assist     Assist level: Contact Guard/Touching assist Assistive device: Walker-rolling   Walk 50 feet activity   Assist Walk 50 feet with 2 turns activity did not occur: Safety/medical concerns (fatigue, pain, decreased balance, weakness/deconditioning)  Assist level: Contact Guard/Touching assist Assistive device: Walker-rolling     Walk 150 feet activity   Assist Walk 150 feet activity did not occur: Safety/medical concerns (fatigue, pain, decreased balance, weakness/deconditioning)         Walk 10 feet on uneven surface  activity   Assist Walk 10 feet on uneven surfaces activity did not occur: Safety/medical concerns (fatigue, pain, decreased balance, weakness/deconditioning)   Assist level: Contact Guard/Touching assist Assistive device: Walker-rolling   Wheelchair     Assist Is the patient using a wheelchair?: Yes Type of Wheelchair: Manual    Wheelchair assist level: Supervision/Verbal cueing Max wheelchair distance: 119ft    Wheelchair 50 feet with 2 turns activity    Assist        Assist Level: Supervision/Verbal cueing   Wheelchair 150 feet activity     Assist      Assist Level: Supervision/Verbal cueing   Blood pressure (!) 136/91, pulse 93, temperature 98.4 F (36.9 C), temperature source Oral, resp. rate 18, height 5\' 7"  (1.702 m), weight 52.3 kg, SpO2 99 %.  Medical Problem List and Plan: 1.  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  PAD/Antithrombotics: -DVT/anticoagulation:  Pharmaceutical: Continue Lovenox             -antiplatelet therapy: Plavix 3. Phantom limb pain: Increase gabapentin to 300mg  HS. Oxycodone prn for severe pain and ultram prn for moderate pain 4. Mood: LCSW to follow for evaluation and support.              -antipsychotic agents: N/A 5. Neuropsych: This patient is capable of making decisions on his own behalf. 6. Skin/Wound Care: Monitor wound for healing.   --On Zinc, vitamin C, boost and prosource to promote healing.  7. Fluids/Electrolytes/Nutrition: Monitor I/O.  8.  GERD: Continue Protonix 9. Acute blood loss anemia: Recheck CBC in am.  10 Tobacco abuse: Continue to encourage tobacco  cessation.              --continue nicotine patch to prevent withdrawal.  11. Hyponatremia: stable, monitor as needed  12. Prediabetes: Hgb A1C- 6.0. Add CM restrictions to diet as fasting BS 107-150 range             --Monitor CBGs daily in am.  13. Elevated A phos: Likely due to infection. Recheck LFTs in am.  14. HTN: Cozaar remains on hold.  --Continue Coreg bid   15. Low protein levels: encouraged high  protein diet.     LOS: 3 days A FACE TO FACE EVALUATION WAS PERFORMED  Clint Bolder P Treyon Wymore 12/18/2020, 1:29 PM

## 2020-12-18 NOTE — Progress Notes (Signed)
Occupational Therapy Session Note  Patient Details  Name: Benjamin [name] MRN: [id number] Date of Birth: Oct 20, 1960  Today's Date: 12/18/2020 OT Individual Time: 1103-1200 OT Individual Time Calculation (min): 57 min    Short Term Goals: Week 1:  OT Short Term Goal 1 (Week 1): STG = LTG due to ELOS  Skilled Therapeutic Interventions/Progress Updates:  Skilled OT intervention completed with focus on residual limb inspection and shower/toilet transfers. Pt received seated in recliner, agreeable to session. Pt completing sit > stand with supervision using RW and stand hopping to sink to complete grooming tasks with CGA with pt using forearms on counter to balance. Provided limb/skin inspection education on [facility] mirror, importance of checking skin for infection and keeping wound clean. Pt unable to teach back methods of cleaning, and inspecting skin, presumably due to health literacy, and would benefit from visual aids for methods as reminders. Pt with questions about when staples will be removed, discussed POC. Provided education on amputation booklet, importance of lengthening L hip as much as possible with positioning as hip is very tight due to pt's seated positioning. Completed TTB transfer at [facility] level with RW. Pt requiring verbal cues and demonstration prior to first trial, then able to repeat transfer with pt able to verbalize the steps out loud and supervision assist. Provided LH sponge and education to eliminate pressure put on residual limb during forward lean to bathe RLE. Pt needing to have BM, transferred to [facility] over toilet with supervision and RW, able to complete toileting with min A for underwear management over L residual limb due to pain and for safety with balance. Stand hop using RW and supervision to recliner, with pt left in a position of comfort. Pt left with alarm belt on and all needs in reach at end of session.  Therapy Documentation Precautions:  Precautions Precautions:  Fall Precaution Comments: pain, monitor HR Restrictions Weight Bearing Restrictions: Yes LLE Weight Bearing: Non weight bearing  Pain: Unrated pain in residual limb with pressure on BSC, encouraged pt to stand for wiping for position. Improved once standing.   Therapy/Group: Individual Therapy  Brinnley Lacap E Mando Blatz 12/18/2020, 7:42 AM

## 2020-12-18 NOTE — Telephone Encounter (Signed)
Pt sister [name] called this Clinical research associate this morning to share that their brother ([name]'s twin and pt's brother) had been found deceased. They are planning a service through the [place] in [place]. Although she is sad Benjamin [name] shares that she is doing okay and was happy to hear that Benjamin [name] had been doing well at [facility]. She is concerned about projected date of discharge as 11/2 since that is the day of their brother's service and they will be in [place]. LCSW will pass this on to CIR LCSW for consideration. Pt sister perseverating on that pt needs to stay there longer. Explained that he is receiving close to 3 hrs of therapy a day and that the program is designed to be a short and intensive rehab program so that pt can return home safer than he may have right after surgery. I explained that discharge is based on his progress and insurance considerations. I encouraged pt sister to reach out to care coordination team with any further questions about discharge planning. I also sent Dossie Der, LCSW, a secure chat with the above concerns. I remain available for assistance post d/c from CIR for any coordination.   [name], MSW, LCSW [facility] Heart/Vascular Care Navigation  [phone]

## 2020-12-18 NOTE — Progress Notes (Signed)
Physical Therapy Session Note  Patient Details  Name: Benjamin [name] MRN: [id number] Date of Birth: [birth year]  Today's Date: 12/18/2020 PT Individual Time: 0933-1028 PT Individual Time Calculation (min): 55 min   Short Term Goals: Week 1:  PT Short Term Goal 1 (Week 1): STG=LTG due to LOS  Skilled Therapeutic Interventions/Progress Updates:  Pt received seated in recliner in room, denied pain but reported fatigue following previous PT session and muscle relaxant he took earlier. Pt agreeable to PT, emphasis of session on pt education regarding positioning of L residual limb, activity tolerance in prone and gait training. Pt performed sit <>stand w/CGA and ambulated 15' to Benjamin [name] w/RW and CGA. Pt self-propelled >200' from room to ortho gym w/S* for dynamic cardiovascular warmup. Sit <>stand pivot from WC to mat w/CGA and RW. Sit <>prone w/S* on mat table.   The following were performed in prone w/wedge under head for improved glute strength, L hip flexor stretch, LLE strength and desensitization of L residual limb:  -Prone lying for 15 minutes, mod tactile cues to lie flat on L hip as pt assumed R sidelying position.  -Glute kickbacks on LLE, emphasis on 3s isometric contraction for increased hip stretch. Pt performed 30-40 reps   Prone <>sit edge of mat w/S* and pt requested to do hammer curls w/10# Dbs, 2x10 per side. Sit <> stand pivot from mat to [name] w/RW and CGA. Pt self-propelled >200' back to room w/S*, reporting UE fatigue during last 50'. Pt performed sit <>stand from [name] and ambulated 10' to recliner in room w/RW and CGA. Pt was left seated in recliner in room, all needs in reach.   Therapy Documentation Precautions:  Precautions Precautions: Fall Precaution Comments: pain, monitor HR Restrictions Weight Bearing Restrictions: Yes LLE Weight Bearing: Non weight bearing   Therapy/Group: Individual Therapy Jill Alexanders Money Mckeithan, PT, DPT  12/18/2020, 7:49 AM

## 2020-12-18 NOTE — Progress Notes (Addendum)
Patient ID: CACE OSORTO, male   DOB: 1960/10/17, 60 y.o.   MRN: [id number] Have left message for pt's sister-[name] and am awaiting return call. I received message form Westley Hummer and will follow up with sister's concerns.  12:54 PM Spoke with sister and she reports it would be really helpful for pt to stay until 11/3 due to funeral is 11/2. Have asked team and MD for their input. Pt doesn't feel either way he does not feel the need to go with them to the funeral. Sister reports he does really well here ut once home is stubborn and does what he wants. Await input from team and MD.  1:38 PM received another call from pt's sister-[name] who would like to have pt go to a NH from here so he can heal and do what he is suppose to do. She is handicapped also and he stresses her.Discussed he would not get therapy there due to medicaid and he would need to agree to this. She reports he has, worker will see pt between therapies to see if agreeable to NHP  2:12 PM Met with pt who is in agreement with looking for a NH bed for him. Made aware to him and sister his insurance may not cover this since he came to rehab. Will sent out Fl2. Although pt worked at [employer] he does not prefer to go there. He has no preference.

## 2020-12-18 NOTE — Progress Notes (Signed)
Physical Therapy Session Note  Patient Details  Name: Benjamin [name] MRN: [id number] Date of Birth: 1960-07-17  Today's Date: 12/18/2020 PT Individual Time: 8338-2505 and 3976-7341  PT Individual Time Calculation (min): 53 min and 24 min  Short Term Goals: Week 1:  PT Short Term Goal 1 (Week 1): STG=LTG due to LOS  Skilled Therapeutic Interventions/Progress Updates:   Treatment Session 1 Received pt sitting EOB with blankets wrapped around him, in the dark. Educated pt again on avoiding spending extended amounts of time sitting in hip flexion to avoid worsening contracture, but pt demonstrates poor carry over and poor health literacy. Pt agreeable to PT treatment and reported pain 6/10 in L residual limb. RN notified and present to administer medications. Repositioning, rest breaks, and distraction done to reduce pain levels. Session with emphasis on functional mobility/transfers, dressing, generalized strengthening, dynamic standing balance/coordination, gait training, and improved activity tolerance. Donned R shoe with supervision and transferred bed<>WC stand<>pivot with RW and CGA. Noticed hole in pt's pants - provided pt with new scrub pants. Stood to remove pants with close supervision and sat in [facility] and donned underwear and clean pants with supervision. Pt stood with CGA to pull underwear/pants over hips. Pt suddenly attempted to reach down and pick up shoe from floor - therapist quickly stopped pt and reminded him to use RW for stabilization and educated pt on avoiding standing to pick up objects from floor when at home due to decreased balance; pt verbalized understanding. Pt transported to/from room in [facility] dependently for time management purposes. Pt ambulated 2ft x 1 and 29ft x 1 using RW and CGA using a "hop to" pattern with no LOB but increased UE fatigue. Pt demonstrates good RLE foot clearance with soft heel strike without any cues. Pt then performed seated RLE strengthening on Kinetron at  20 cm/sec for 1 minute and at 10 cm/sec for 1 minute x 2 trials with therapist providing manual counter resistance with emphasis on glute/quad strengthening. Pt ambulated 4ft with RW and CGA to recliner. Concluded session with pt reclined in recliner (to stretch L hip), needs within reach, and seatbelt alarm on.   Treatment Session 2 Received pt sitting EOB, pt agreeable to PT treatment, and reported pain 6/10 in L residual limb (premedicated). Pt upset that his sister wants him to go to a nursing home rather than going home and told therapist he didn't want to go - notified CSW/care team. Session with emphasis on functional mobility/transfers, generalized strengthening, and improved activity tolerance. Pt performed bed mobility with supervision multiple times throughout session and performed the following exercises in supine to stretch hip flexors with verbal cues for technique and emphasis on strength and ROM: -SLR 2x10 bilaterally  -hip abduction 2x10 bilaterally  -R hip marching 2x12 -R single leg bridges 2x10 Pt with difficulty lying completely flat, frequently propping himself up on elbows and sitting back up on EOB despite encouragement to lie flat. Noted swelling in RLE; educated pt on avoiding sitting EOB with leg dangling and encouraged lying in bed and propping up LE or elevating it in recliner. Pt voided in urinal mod I and concluded session with pt sitting EOB, needs within reach, and bed alarm on.    Therapy Documentation Precautions:  Precautions Precautions: Fall Precaution Comments: pain, monitor HR Restrictions Weight Bearing Restrictions: Yes LLE Weight Bearing: Non weight bearing  Therapy/Group: Individual Therapy Martin Majestic PT, DPT   12/18/2020, 7:25 AM

## 2020-12-18 NOTE — NC FL2 (Signed)
[place] MEDICAID FL2 LEVEL OF CARE SCREENING TOOL     IDENTIFICATION  Patient Name: Benjamin [name] Birthdate: March 28, 1960 Sex: male Admission Date (Current Location): 12/15/2020  [place] and [place] Number:  Haynes Bast [id number] HEALTHY BLUE Facility and Address:  [facility], [address], [place], [place] [address]      Provider Number: [id number]  Attending Physician Name and Address:  Horton Chin, MD  Relative Name and Phone Number:  Tyson Dense [phone] OR [phone]    Current Level of Care: Other (Comment) (REHAB) Recommended Level of Care: Skilled Nursing Facility Prior Approval Number:    Date Approved/Denied:   PASRR Number: [id number]  Discharge Plan: SNF    Current Diagnoses: Patient Active Problem List   Diagnosis Date Noted   Unilateral AKA, left (HCC) 12/15/2020   Protein-calorie malnutrition, severe 12/09/2020   Wound cellulitis 12/07/2020   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/2020   Ischemia of lower extremity 10/01/2020   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   Femoral-popliteal bypass graft occlusion, 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   PAD (peripheral artery disease) (HCC) 11/13/2019   Callus of foot 07/01/2019   Intermittent claudication (HCC) 01/20/2017   Need for influenza vaccination 01/20/2017   Needs smoking cessation education 09/06/2016   Abnormal echocardiogram 09/05/2016   Abnormal EKG 07/25/2016   Preoperative cardiovascular examination 07/25/2016   Shortness of breath on exertion 07/25/2016   Dyslipidemia, goal LDL below 70 07/25/2016   Uncontrolled hypertension 07/25/2016   Abnormal chest x-ray 07/04/2016   Peripheral vascular disease (HCC) 07/04/2016   Decreased pedal pulses 06/24/2016   Varicose vein of leg 06/24/2016   Heavy smoker 06/24/2016   Abnormal weight loss 06/24/2016   Foot pain, left 06/24/2016   Screening for prostate cancer 06/24/2016    Orientation RESPIRATION BLADDER Height & Weight      Self, Time, Situation, Place  Normal Continent Weight: 115 lb 4.8 oz (52.3 kg) Height:  5\' 7"  (170.2 cm)  BEHAVIORAL SYMPTOMS/MOOD NEUROLOGICAL BOWEL NUTRITION STATUS      Continent Diet (REGULAR THIN LIQUIDS)  AMBULATORY STATUS COMMUNICATION OF NEEDS Skin   Supervision Verbally Surgical wounds                       Personal Care Assistance Level of Assistance  Bathing, Dressing Bathing Assistance: Limited assistance   Dressing Assistance: Limited assistance     Functional Limitations Info             SPECIAL CARE FACTORS FREQUENCY  PT (By licensed PT), OT (By licensed OT)     PT Frequency: 2X WEEK OT Frequency: 2X WEEK            Contractures Contractures Info: Not present    Additional Factors Info  Code Status, Allergies, Isolation Precautions Code Status Info: FULL CODE Allergies Info: ASPIRIN AND GARLIC SEASONING     Isolation Precautions Info: ESBL     Current Medications (12/18/2020):  This is the current hospital active medication list Current Facility-Administered Medications  Medication Dose Route Frequency Provider Last Rate Last Admin   (feeding supplement) PROSource Plus liquid 30 mL  30 mL Oral BID BM Love, Pamela S, PA-C   30 mL at 12/18/20 1331   acetaminophen (TYLENOL) tablet 325-650 mg  325-650 mg Oral Q4H PRN Love, Pamela S, PA-C       albuterol (PROVENTIL) (2.5 MG/3ML) 0.083% nebulizer solution 2.5 mg  2.5 mg Nebulization Q6H PRN Love, Pamela S, PA-C       alum &  mag hydroxide-simeth (MAALOX/MYLANTA) 200-200-20 MG/5ML suspension 30 mL  30 mL Oral Q4H PRN Love, Pamela S, PA-C       ascorbic acid (VITAMIN C) tablet 500 mg  500 mg Oral Daily Jacquelynn Cree, PA-C   500 mg at 12/18/20 0810   atorvastatin (LIPITOR) tablet 20 mg  20 mg Oral Daily Jacquelynn Cree, PA-C   20 mg at 12/18/20 3710   bisacodyl (DULCOLAX) suppository 10 mg  10 mg Rectal Daily PRN Love, Pamela S, PA-C       carvedilol (COREG) tablet 6.25 mg  6.25 mg Oral BID WC Love,  Pamela S, PA-C   6.25 mg at 12/18/20 6269   clopidogrel (PLAVIX) tablet 75 mg  75 mg Oral Daily Jacquelynn Cree, PA-C   75 mg at 12/18/20 4854   diphenhydrAMINE (BENADRYL) 12.5 MG/5ML elixir 12.5-25 mg  12.5-25 mg Oral Q6H PRN Love, Pamela S, PA-C       docusate sodium (COLACE) capsule 100 mg  100 mg Oral Daily Jacquelynn Cree, PA-C   100 mg at 12/18/20 6270   enoxaparin (LOVENOX) injection 40 mg  40 mg Subcutaneous Q24H Love, Pamela S, PA-C   40 mg at 12/17/20 2023   feeding supplement (BOOST / RESOURCE BREEZE) liquid 1 Container  1 Container Oral BID BM Jacquelynn Cree, PA-C   1 Container at 12/18/20 1332   gabapentin (NEURONTIN) capsule 300 mg  300 mg Oral QHS Raulkar, Drema Pry, MD       guaiFENesin-dextromethorphan (ROBITUSSIN DM) 100-10 MG/5ML syrup 15 mL  15 mL Oral Q4H PRN Love, Pamela S, PA-C       hydrOXYzine (ATARAX/VISTARIL) tablet 25 mg  25 mg Oral QHS PRN Jacquelynn Cree, PA-C   25 mg at 12/15/20 2302   methocarbamol (ROBAXIN) tablet 500 mg  500 mg Oral Q6H PRN Jacquelynn Cree, PA-C   500 mg at 12/18/20 1331   multivitamin with minerals tablet 1 tablet  1 tablet Oral Daily Jacquelynn Cree, PA-C   1 tablet at 12/18/20 3500   nicotine (NICODERM CQ - dosed in mg/24 hours) patch 21 mg  21 mg Transdermal Daily Jacquelynn Cree, PA-C   21 mg at 12/18/20 0820   oxyCODONE-acetaminophen (PERCOCET/ROXICET) 5-325 MG per tablet 1-2 tablet  1-2 tablet Oral Q4H PRN Jacquelynn Cree, PA-C   2 tablet at 12/18/20 9381   pantoprazole (PROTONIX) EC tablet 40 mg  40 mg Oral Daily Jacquelynn Cree, PA-C   40 mg at 12/18/20 0810   phenol (CHLORASEPTIC) mouth spray 1 spray  1 spray Mouth/Throat PRN Love, Pamela S, PA-C       polyethylene glycol (MIRALAX / GLYCOLAX) packet 17 g  17 g Oral Daily PRN Love, Pamela S, PA-C       prochlorperazine (COMPAZINE) tablet 5-10 mg  5-10 mg Oral Q6H PRN Love, Pamela S, PA-C       Or   prochlorperazine (COMPAZINE) injection 5-10 mg  5-10 mg Intramuscular Q6H PRN Love, Pamela S, PA-C        Or   prochlorperazine (COMPAZINE) suppository 12.5 mg  12.5 mg Rectal Q6H PRN Love, Pamela S, PA-C       senna (SENOKOT) tablet 8.6 mg  1 tablet Oral BID Love, Pamela S, PA-C   8.6 mg at 12/18/20 0810   sodium phosphate (FLEET) 7-19 GM/118ML enema 1 enema  1 enema Rectal Once PRN Love, Evlyn Kanner, PA-C       traMADol Janean Sark) tablet  50 mg  50 mg Oral Q12H PRN Jacquelynn Cree, PA-C   50 mg at 12/15/20 2246   traZODone (DESYREL) tablet 25-50 mg  25-50 mg Oral QHS PRN Jacquelynn Cree, PA-C   50 mg at 12/16/20 2107   zinc sulfate capsule 220 mg  220 mg Oral Daily Jacquelynn Cree, PA-C   220 mg at 12/18/20 7824     Discharge Medications: Please see discharge summary for a list of discharge medications.  Relevant Imaging Results:  Relevant Lab Results:   Additional Information SSN: [id number] COVID VACCINES-(2)  Si Gaul [name], LCSW

## 2020-12-19 NOTE — Progress Notes (Signed)
Occupational Therapy Session Note  Patient Details  Name: Benjamin [name] MRN: [id number] Date of Birth: 03/06/1960  Today's Date: 12/19/2020 OT Individual Time: 1415-1530 OT Individual Time Calculation (min): 75 min    Short Term Goals: Week 1:  OT Short Term Goal 1 (Week 1): STG = LTG due to ELOS  Skilled Therapeutic Interventions/Progress Updates:    Pt semi reclined in bed, reporting feeling hungry and requesting snack. Also c/o pain in left hip and phantom limb type pain in left ankle region.  Pt required min assist to call and order desired snack from dietary. Pt self fed meal with mod I midway through mirror therapy.  While pt self feeding, educated and instructed pt on mirror therapy benefits and treatment through didactic and visual demonstration.  Pt participated in simple mirror therapy activity in long sitting position in bed.  Pt initially observed limb "through window" and then added gentle AROM of right ankle and knee.  Pt then instructed on prone hip extension stretch.  Pt attempted however only able to tolerate 10-15 second intervals in full prone, therefore attempted in full supine position.  Pt tolerated this more easily but returned to flexed position at hip intermittently due to reported pain in back with full supine.  Educated pt on importance of hip extensor mobility in preparation for prosthesis provision. Nurse made aware of pain levels and request for pain medication.  Call bell in reach, bed alarm on.    Therapy Documentation Precautions:  Precautions Precautions: Fall Precaution Comments: pain, monitor HR Restrictions Weight Bearing Restrictions: Yes LLE Weight Bearing: Non weight bearing   Therapy/Group: Individual Therapy  Amie Critchley 12/19/2020, 4:00 PM

## 2020-12-19 NOTE — Progress Notes (Signed)
Physical Therapy Session Note  Patient Details  Name: Benjamin [name] MRN: [id number] Date of Birth: [birth year]  Today's Date: 12/19/2020 PT Individual Time: 0805-0905 and 1105-1200 PT Individual Time Calculation (min): 60 min and55 min   Short Term Goals: Week 1:  PT Short Term Goal 1 (Week 1): STG=LTG due to LOS  Skilled Therapeutic Interventions/Progress Updates:   Pt received sitting EOB and agreeable to PT. Pt requesting  for pain medication. Once pain meds administered, Stand pivot transfer to [facility] with supervision assist.   WC mobility with supervision assist x 171f, 2053fand 17063fCues for doorway management in tight spaces.   Standing therex in parallel bars. Hip extension, hip abduction, miin squat 2x 10 BLE. Gait training with RW x 63f21fth CGA-min assist for safety.   Patient returned to room and performed stand pivot to recliner with RW and min assist Pt left sitting in recliner with call bell in reach and all needs met.    Session 2.  Pt received sitting in recliner and agreeable to PT. Pt performed WC mobility with supervision assist x 200ft57fd 220ft 58f cues for direction.   Pt performed UE therex: UBE 4 min forward/4 min reverse on random resistance. Chest press to push ball back to 3 x 1 min 5# bar weight.   Sit<>stand from [facility] with supervision assist and cues for safety and improved UE position.   Gait training with RW in room x 18ft w25fsupervision assist and cues for posture and step length in turns.   Patient returned to room and performed stand pivot to recliner with RW and supervision assist. Pt left sitting in recliner with call bell in reach and all needs met.            Therapy Documentation Precautions:  Precautions Precautions: Fall Precaution Comments: pain, monitor HR Restrictions Weight Bearing Restrictions: Yes LLE Weight Bearing: Non weight bearing General:   Vital Signs: Therapy Vitals Pulse Rate: 96 BP: 134/78 Pain: Pain  Assessment Pain Scale: 0-10 Pain Score: 6  Pain Type: Phantom pain;Surgical pain Pain Location: Leg Pain Orientation: Left Pain Descriptors / Indicators: Aching;Burning Pain Frequency: Constant Pain Onset: On-going Patients Stated Pain Goal: 4 Pain Intervention(s): Medication (See eMAR) Mobility:   Locomotion :    Trunk/Postural Assessment :    Balance:   Exercises:   Other Treatments:      Therapy/Group: Individual Therapy  Royce Sciara Lorie Phenix2022, 9:08 AM

## 2020-12-20 NOTE — Progress Notes (Signed)
PROGRESS NOTE   Subjective/Complaints:  Pt had gabapentin increased Friday- still no change significantly in phantom pain-  Doppler not working, but nurse couldn't feel pulse today on R foot- is warm and good color.     ROS:  Pt denies SOB, abd pain, CP, N/V/C/D, and vision changes Having phantom pain  Objective:   No results found. No results for input(s): WBC, HGB, HCT, PLT in the last 72 hours.  No results for input(s): NA, K, CL, CO2, GLUCOSE, BUN, CREATININE, CALCIUM in the last 72 hours.   Intake/Output Summary (Last 24 hours) at 12/20/2020 1735 Last data filed at 12/20/2020 1421 Gross per 24 hour  Intake 480 ml  Output 2325 ml  Net -1845 ml        Physical Exam: Vital Signs Blood pressure 122/78, pulse 86, temperature 98 F (36.7 C), temperature source Oral, resp. rate 16, height 5\' 7"  (1.702 m), weight 52.3 kg, SpO2 99 %.   General: awake, alert, appropriate, sitting EOB eating lunch; nurse in room; NAD HENT: conjugate gaze; oropharynx moist CV: regular rate; no JVD Pulmonary: CTA B/L; no W/R/R- good air movement GI: soft, NT, ND, (+)BS Psychiatric: appropriate- focused on meal Neurological: alert  Musculoskeletal:     Cervical back: Normal range of motion. No rigidity.     Comments: Hight L-AKA with dry dressing.C/D/I MS: UE strength 5/5; RLE 5/5 LLE- didn't test due to high AKA except could lift against gravity- HF  Skin:    Comments: Pt didn't allow me to assess- since "just walked- is throbbing- asked for pain meds L groin incision assessed - healed by moist- no yeast seen   R foot- has DP pulse- trace; not PT pulse- is warm- no skin breakdown.  On RLE Neurological:     Mental Status: He is alert and oriented to person, place, and time.     Comments: Intact to light touch in UE and RLE-  Psychiatric:        Mood and Affect: Mood normal.        Behavior: Behavior normal.     Assessment/Plan: 1. Functional deficits which require 3+ hours per day of interdisciplinary therapy in a comprehensive inpatient rehab setting. Physiatrist is providing close team supervision and 24 hour management of active medical problems listed below. Physiatrist and rehab team continue to assess barriers to discharge/monitor patient progress toward functional and medical goals  Care Tool:  Bathing    Body parts bathed by patient: Right arm, Left arm, Chest, Abdomen, Front perineal area, Buttocks, Right upper leg, Face, Left upper leg, Right lower leg   Body parts bathed by helper: Right lower leg Body parts n/a: Left upper leg   Bathing assist Assist Level: Supervision/Verbal cueing     Upper Body Dressing/Undressing Upper body dressing   What is the patient wearing?: Pull over shirt    Upper body assist Assist Level: Supervision/Verbal cueing    Lower Body Dressing/Undressing Lower body dressing      What is the patient wearing?: Underwear/pull up, Pants     Lower body assist Assist for lower body dressing: Supervision/Verbal cueing     Toileting Toileting    Toileting assist  Assist for toileting: Supervision/Verbal cueing Assistive Device Comment: BSC   Transfers Chair/bed transfer  Transfers assist     Chair/bed transfer assist level: Contact Guard/Touching assist     Locomotion Ambulation   Ambulation assist      Assist level: Contact Guard/Touching assist Assistive device: Walker-rolling Max distance: 1ft   Walk 10 feet activity   Assist     Assist level: Contact Guard/Touching assist Assistive device: Walker-rolling   Walk 50 feet activity   Assist Walk 50 feet with 2 turns activity did not occur: Safety/medical concerns (fatigue, pain, decreased balance, weakness/deconditioning)  Assist level: Contact Guard/Touching assist Assistive device: Walker-rolling    Walk 150 feet activity   Assist Walk 150 feet activity did  not occur: Safety/medical concerns (fatigue, pain, decreased balance, weakness/deconditioning)         Walk 10 feet on uneven surface  activity   Assist Walk 10 feet on uneven surfaces activity did not occur: Safety/medical concerns (fatigue, pain, decreased balance, weakness/deconditioning)   Assist level: Contact Guard/Touching assist Assistive device: Walker-rolling   Wheelchair     Assist Is the patient using a wheelchair?: Yes Type of Wheelchair: Manual    Wheelchair assist level: Supervision/Verbal cueing Max wheelchair distance: 16ft    Wheelchair 50 feet with 2 turns activity    Assist        Assist Level: Supervision/Verbal cueing   Wheelchair 150 feet activity     Assist      Assist Level: Supervision/Verbal cueing   Blood pressure 122/78, pulse 86, temperature 98 F (36.7 C), temperature source Oral, resp. rate 16, height 5\' 7"  (1.702 m), weight 52.3 kg, SpO2 99 %.  Medical Problem List and Plan: 1.  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  PAD/Antithrombotics: -DVT/anticoagulation:  Pharmaceutical: Continue Lovenox             -antiplatelet therapy: Plavix 3. Phantom limb pain: Increase gabapentin to 300mg  HS. Oxycodone prn for severe pain and ultram prn for moderate pain  10/30- will allow Dr manage gabapentin dose-but think 300 mg BID or 600 mg QHS might be effective?  4. Mood: LCSW to follow for evaluation and support.              -antipsychotic agents: N/A 5. Neuropsych: This patient is capable of making decisions on his own behalf. 6. Skin/Wound Care: Monitor wound for healing.   --On Zinc, vitamin C, boost and prosource to promote healing.  7. Fluids/Electrolytes/Nutrition: Monitor I/O.  8.  GERD: Continue Protonix 9. Acute blood loss anemia: Recheck CBC  in am.  10 Tobacco abuse: Continue to encourage tobacco cessation.              --continue nicotine patch to prevent withdrawal.  11. Hyponatremia: stable, monitor as needed  12. Prediabetes: Hgb A1C- 6.0. Add CM restrictions to diet as fasting BS 107-150 range             --Monitor CBGs daily in am.  13. Elevated A phos: Likely due to infection. Recheck LFTs in am.  14. HTN: Cozaar remains on hold.  --Continue Coreg bid   10/30- BP controlled- con't regimen 15. Low protein levels: encouraged high protein diet.  LOS: 5 days A FACE TO FACE EVALUATION WAS PERFORMED  Benjamin [name] 12/20/2020, 5:35 PM

## 2020-12-21 ENCOUNTER — Encounter (HOSPITAL_BASED_OUTPATIENT_CLINIC_OR_DEPARTMENT_OTHER): Payer: Medicaid Other | Admitting: Internal Medicine

## 2020-12-21 LAB — BASIC METABOLIC PANEL
Anion gap: 8 (ref 5–15)
BUN: 13 mg/dL (ref 8–23)
CO2: 26 mmol/L (ref 22–32)
Calcium: 8.6 mg/dL — ABNORMAL LOW (ref 8.9–10.3)
Chloride: 102 mmol/L (ref 98–111)
Creatinine, Ser: 0.68 mg/dL (ref 0.61–1.24)
GFR, Estimated: >60 mL/min (ref >60)
Glucose, Bld: 95 mg/dL (ref 70–99)
Potassium: 3.3 mmol/L — ABNORMAL LOW (ref 3.5–5.1)
Sodium: 136 mmol/L (ref 135–145)

## 2020-12-21 LAB — CBC
HCT: 23.2 % — ABNORMAL LOW (ref 39.0–52.0)
Hemoglobin: 7.5 g/dL — ABNORMAL LOW (ref 13.0–17.0)
MCH: 24.6 pg — ABNORMAL LOW (ref 26.0–34.0)
MCHC: 32.3 g/dL (ref 30.0–36.0)
MCV: 76.1 fL — ABNORMAL LOW (ref 80.0–100.0)
Platelets: 644 10*3/uL — ABNORMAL HIGH (ref 150–400)
RBC: 3.05 MIL/uL — ABNORMAL LOW (ref 4.22–5.81)
RDW: 20.8 % — ABNORMAL HIGH (ref 11.5–15.5)
WBC: 6.7 10*3/uL (ref 4.0–10.5)
nRBC: 0 % (ref 0.0–0.2)

## 2020-12-21 MED ORDER — POTASSIUM CHLORIDE 20 MEQ PO PACK
40.0000 meq | PACK | Freq: Once | ORAL | Status: AC
  Administered 2020-12-21: 40 meq via ORAL
  Filled 2020-12-21: qty 2

## 2020-12-21 NOTE — Progress Notes (Signed)
Occupational Therapy Session Note  Patient Details  Name: Benjamin [name] MRN: [id number] Date of Birth: May 23, 1960  Today's Date: 12/21/2020 OT Individual Time: 4193-7902 OT Individual Time Calculation (min): 70 min    Short Term Goals: Week 1:  OT Short Term Goal 1 (Week 1): STG = LTG due to ELOS  Skilled Therapeutic Interventions/Progress Updates:  Skilled OT intervention completed with focus on ADL retraining, d/c planning and BUE endurance. Pt received seated EOB, agreeable to session. Pt opted to complete self-care tasks at EOB vs shower, due to misunderstanding about his ability to shower with his wound. Educated pt that he can shower in shower if residual limb covered, with pt thinking he was told he couldn't shower until staples removed. Still preferred EOB bathing. Completed UB bathing and dressing with set up assist, then LB bathing with supervision for leaning forward to reach RLE and standing using RW and supervision assist to clean buttocks and front perineal. Donned grip sock on with supervision while seated as pt needed to lean back in order to reach foot. Completed LB dressing with supervision to thread RLE into pants and donn pants over hips at the sit > stand level. Discussed d/c plans, with pt at first reporting he was going home, then accurately recalled plan to go to SNF that was decided between him and his sister. Supervision for bed mobility, pt encouraged to lay supine for at least 15 seconds at a time to promote light stretch in L hip, due to pt's intolerance with prone stretches from previous sessions due to pain. Pt with decreased awareness to why it's important to lay flat/provide stretching to L hip, despite being educated minutes before. Completed sit > stand with supervision using RW, then ambulated with RW in hallway about 100 ft before requiring seated rest break, to promote BUE and cardiorespiratory endurance. Pt self-propelled while seated in w/c back to pts room, with  pt left seated in recliner in a position of comfort, with chair alarm activated and all needs within reach at end of session.  Therapy Documentation Precautions:  Precautions Precautions: Fall Precaution Comments: pain, monitor HR Restrictions Weight Bearing Restrictions: Yes LLE Weight Bearing: Non weight bearing  Pain: No c/o pain during session.   Therapy/Group: Individual Therapy  Beyonka Pitney E Kenyatte Gruber 12/21/2020, 7:43 AM

## 2020-12-21 NOTE — Progress Notes (Signed)
PROGRESS NOTE   Subjective/Complaints: Patient and his sister prefer d/c to SNF so he can be more closely monitored and less of a burden to his sister.  Sleep has improved     ROS:  Pt denies SOB, abd pain, CP, N/V/C/D, and vision changes, insomnia improved  Objective:   No results found. Recent Labs    12/21/20 0605  WBC 6.7  HGB 7.5*  HCT 23.2*  PLT 644*    Recent Labs    12/21/20 0605  NA 136  K 3.3*  CL 102  CO2 26  GLUCOSE 95  BUN 13  CREATININE 0.68  CALCIUM 8.6*     Intake/Output Summary (Last 24 hours) at 12/21/2020 1019 Last data filed at 12/21/2020 0811 Gross per 24 hour  Intake 840 ml  Output 750 ml  Net 90 ml        Physical Exam: Vital Signs Blood pressure 134/86, pulse 96, temperature 97.8 F (36.6 C), temperature source Oral, resp. rate 18, height 5\' 7"  (1.702 m), weight 52.3 kg, SpO2 97 %. Gen: no distress, normal appearing HEENT: oral mucosa pink and moist, NCAT Cardio: Reg rate Chest: normal effort, normal rate of breathing Abd: soft, non-distended Ext: no edema Psych: pleasant, normal affect Skin: intact  Musculoskeletal:     Cervical back: Normal range of motion. No rigidity.     Comments: Hight L-AKA with dry dressing.C/D/I MS: UE strength 5/5; RLE 5/5 LLE- didn't test due to high AKA except could lift against gravity- HF  Skin:    Comments: Pt didn't allow me to assess- since "just walked- is throbbing- asked for pain meds L groin incision assessed - healed by moist- no yeast seen   R foot- has DP pulse- trace; not PT pulse- is warm- no skin breakdown.  On RLE Neurological:     Mental Status: He is alert and oriented to person, place, and time.     Comments: Intact to light touch in UE and RLE-  Psychiatric:        Mood and Affect: Mood normal.        Behavior: Behavior normal.    Assessment/Plan: 1. Functional deficits which require 3+ hours per day of  interdisciplinary therapy in a comprehensive inpatient rehab setting. Physiatrist is providing close team supervision and 24 hour management of active medical problems listed below. Physiatrist and rehab team continue to assess barriers to discharge/monitor patient progress toward functional and medical goals  Care Tool:  Bathing    Body parts bathed by patient: Right arm, Left arm, Chest, Abdomen, Front perineal area, Buttocks, Right upper leg, Face, Left upper leg, Right lower leg   Body parts bathed by helper: Right lower leg Body parts n/a: Left upper leg   Bathing assist Assist Level: Supervision/Verbal cueing     Upper Body Dressing/Undressing Upper body dressing   What is the patient wearing?: Pull over shirt    Upper body assist Assist Level: Supervision/Verbal cueing    Lower Body Dressing/Undressing Lower body dressing      What is the patient wearing?: Underwear/pull up, Pants     Lower body assist Assist for lower body dressing: Supervision/Verbal cueing  Toileting Toileting    Toileting assist Assist for toileting: Supervision/Verbal cueing Assistive Device Comment: BSC   Transfers Chair/bed transfer  Transfers assist     Chair/bed transfer assist level: Supervision/Verbal cueing     Locomotion Ambulation   Ambulation assist      Assist level: Supervision/Verbal cueing Assistive device: Walker-rolling Max distance: 5ft   Walk 10 feet activity   Assist     Assist level: Supervision/Verbal cueing Assistive device: Walker-rolling   Walk 50 feet activity   Assist Walk 50 feet with 2 turns activity did not occur: Safety/medical concerns (fatigue, pain, decreased balance, weakness/deconditioning)  Assist level: Supervision/Verbal cueing Assistive device: Walker-rolling    Walk 150 feet activity   Assist Walk 150 feet activity did not occur: Safety/medical concerns (fatigue, pain, decreased balance, weakness/deconditioning)          Walk 10 feet on uneven surface  activity   Assist Walk 10 feet on uneven surfaces activity did not occur: Safety/medical concerns (fatigue, pain, decreased balance, weakness/deconditioning)   Assist level: Contact Guard/Touching assist Assistive device: Walker-rolling   Wheelchair     Assist Is the patient using a wheelchair?: Yes Type of Wheelchair: Manual    Wheelchair assist level: Supervision/Verbal cueing Max wheelchair distance: 155ft    Wheelchair 50 feet with 2 turns activity    Assist        Assist Level: Supervision/Verbal cueing   Wheelchair 150 feet activity     Assist      Assist Level: Supervision/Verbal cueing   Blood pressure 134/86, pulse 96, temperature 97.8 F (36.6 C), temperature source Oral, resp. rate 18, height 5\' 7"  (1.702 m), weight 52.3 kg, SpO2 97 %.  Medical Problem List and Plan: 1.  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  PAD/Antithrombotics: Continue Lovenox             -antiplatelet therapy: Plavix 3. Phantom limb pain: Continue gabapentin 300mg  HS. Oxycodone prn for severe pain and ultram prn for moderate pain 4. Mood: LCSW to follow for evaluation and support.              -antipsychotic agents: N/A 5. Neuropsych: This patient is capable of making decisions on his own behalf. 6. Skin/Wound Care: Monitor wound for healing.   --On Zinc, vitamin C, boost and prosource to promote healing.  7. Fluids/Electrolytes/Nutrition: Monitor I/O.  8.  GERD: Continue Protonix 9. Acute blood loss anemia: Recheck CBC in am.  10 Tobacco abuse: Continue to encourage tobacco cessation.              --continue nicotine patch to prevent withdrawal.  11. Hyponatremia: stable, monitor as needed  12. Prediabetes: Hgb A1C- 6.0. Add CM restrictions to diet as  fasting BS 107-150 range             --Monitor CBGs daily in am.  13. Elevated A phos: Likely due to infection. Recheck LFTs in am.  14. HTN: Cozaar remains on hold.  --Continue Coreg bid   10/30- BP controlled- con't regimen 15. Low protein levels: encouraged high protein diet.  16. Hypokalemia: supplement with klor today    LOS: 6 days A FACE TO FACE EVALUATION WAS PERFORMED  11/30  Benjamin [name] 12/21/2020, 10:19 AM

## 2020-12-21 NOTE — Progress Notes (Addendum)
Patient ID: Benjamin [name], male   DOB: 1960/04/06, 60 y.o.   MRN: [id number]  Met with pt and spoke with sister regarding bed offers-[facility] and [facility]. He has chosen [facility] have let message with [facility] to begin insurance auth and to call me when out of morning meeting. Sister in agreement with this plan.   1;52 PM Attempted to call [facility] at [facility] with no answer-another message left

## 2020-12-21 NOTE — Progress Notes (Signed)
   12/21/20 1100  Clinical Encounter Type  Visited With Other (Comment) (Spoke with the patient on the phone)  Visit Type Initial  Referral From Nurse  Consult/Referral To Chaplain   Chaplain responded to the consult request. This chaplain spoke with the patient over the phone. The patient said his brother passed away a few weeks ago. This chaplain asked open-ended questions to assess his grief and questions to bring forth feelings. The patient also requested prayer. This note was prepared by Deneen Harts, M.Div..  For questions please contact by phone [phone].

## 2020-12-21 NOTE — Discharge Summary (Signed)
Physical Therapy Discharge Summary  Patient Details  Name: Benjamin [name] MRN: [id number] Date of Birth: May 03, 1960  Patient has met 9 of 9 long term goals due to improved activity tolerance, improved balance, improved postural control, increased strength, increased range of motion, ability to compensate for deficits, improved awareness, and improved coordination. Patient to discharge at a wheelchair level Modified Independent. Patient's care partner unavailable to provide the necessary physical and cognitive assistance at discharge. Pt/pt's sister have discussed that the best option is to D/C to SNF for continued care.   All goals met   Recommendation:  Patient will benefit from ongoing skilled PT services in home health setting to continue to advance safe functional mobility, address ongoing impairments in transfers, generalized strengthening, dynamic standing balance/coordination, gait training, amputee education, endurance, and to minimize fall risk. However, pt and pt's sister have decided pt will D/C to SNF for continued care.   Equipment: No equipment provided  Reasons for discharge: treatment goals met and discharge from hospital  Patient/family agrees with progress made and goals achieved: Yes  PT Discharge Precautions/Restrictions Precautions Precautions: Fall Restrictions Weight Bearing Restrictions: Yes LLE Weight Bearing: Non weight bearing Pain Interference Pain Interference Pain Effect on Sleep: 2. Occasionally Pain Interference with Therapy Activities: 1. Rarely or not at all Pain Interference with Day-to-Day Activities: 2. Occasionally Cognition Overall Cognitive Status: Within Functional Limits for tasks assessed Arousal/Alertness: Awake/alert Orientation Level: Oriented X4 Memory: Impaired Awareness: Appears intact Problem Solving: Impaired Safety/Judgment: Appears intact Sensation Sensation Light Touch: Impaired by gross assessment Proprioception: Appears  Intact Additional Comments: pt reports decreased sensation along L residual limb Coordination Gross Motor Movements are Fluid and Coordinated: No Fine Motor Movements are Fluid and Coordinated: No Coordination and Movement Description: mild uncoordination due to pain, decreased balance/postural control, and generalized weakness Finger Nose Finger Test: slow bilaterally Heel Shin Test: WFL on RLE, unable to perform on LLE due to L AKA Motor  Motor Motor: Within Functional Limits Motor - Skilled Clinical Observations: mild uncoordination due to pain, decreased balance/postural control, and generalized weakness  Mobility Bed Mobility Bed Mobility: Rolling Right;Rolling Left;Sit to Supine;Supine to Sit Rolling Right: Independent with assistive device Rolling Left: Independent with assistive device Supine to Sit: Independent with assistive device Sit to Supine: Independent with assistive device Transfers Transfers: Sit to Stand;Stand to Sit;Stand Pivot Transfers Sit to Stand: Independent with assistive device Stand to Sit: Independent with assistive device Stand Pivot Transfers: Independent with assistive device Transfer (Assistive device): Rolling walker Locomotion  Gait Ambulation: Yes Gait Assistance: Independent with assistive device Gait Distance (Feet): 80 Feet Assistive device: Rolling walker Gait Gait: Yes Gait Pattern:  (hop-to pattern) Gait velocity: decreased Stairs / Additional Locomotion Stairs: No Ramp: Supervision/Verbal cueing (gait using RW) Pick up small object from the floor assist level: Moderate Assistance - Patient 50 - 74% Wheelchair Mobility Wheelchair Mobility: Yes Wheelchair Assistance: Independent with Camera operator: Both upper extremities Wheelchair Parts Management: Needs assistance Distance: 231f  Trunk/Postural Assessment  Cervical Assessment Cervical Assessment: Exceptions to [facility] head) Thoracic  Assessment Thoracic Assessment: Exceptions to [place](kyphosis) Lumbar Assessment Lumbar Assessment: Exceptions to [facility](posterior pelvic tilt) Postural Control Postural Control: Within Functional Limits  Balance Balance Balance Assessed: Yes Static Sitting Balance Static Sitting - Balance Support: Feet supported;No upper extremity supported Static Sitting - Level of Assistance: 7: Independent Dynamic Sitting Balance Dynamic Sitting - Balance Support: Feet supported;No upper extremity supported Dynamic Sitting - Level of Assistance: 7: Independent Static Standing Balance  Static Standing - Balance Support: Bilateral upper extremity supported (RW) Static Standing - Level of Assistance: 6: Modified independent (Device/Increase time) Dynamic Standing Balance Dynamic Standing - Balance Support: Bilateral upper extremity supported (RW) Dynamic Standing - Level of Assistance: 6: Modified independent (Device/Increase time) Extremity Assessment  RLE Assessment RLE Assessment: Exceptions to [facility] General Strength Comments: grossly generalized to 4/5 LLE Assessment LLE Assessment: Exceptions to [facility] General Par LLE Strength Left Hip Flexion: 3+/5 Left Hip ABduction: 3+/5 Left Hip ADduction: 3+/5  Page Spiro , PT, DPT, NCS, CSRS  Alfonse Alpers PT, DPT  12/22/2020, 6:34 PM

## 2020-12-21 NOTE — Progress Notes (Signed)
Physical Therapy Session Note  Patient Details  Name: Benjamin [name] MRN: [id number] Date of Birth: 06-12-1960  Today's Date: 12/21/2020 PT Individual Time: 4098-1191 and 1100-1154 PT Individual Time Calculation (min): 53 min and 54 min   Short Term Goals: Week 1:  PT Short Term Goal 1 (Week 1): STG=LTG due to LOS  Skilled Therapeutic Interventions/Progress Updates:   Treatment Session 1 Received pt sitting EOB, pt agreeable to PT treatment, and reported pain 6/10 in L residual limb (premedicated). Repositioning, rest breaks, and distraction done to reduce pain levels. Session with focus on functional mobility, generalized strengthening, dynamic standing balance/coordination, gait training, and improved activity tolerance. Donned pull over shirt and donned R sock and shoe with setup assist but required assist from therapist to tie laces. Pt performed x 2 stand<>pivot with RW and supervision throughout session. Pt transported to/from room in [facility] total A for time management purposes. Sit<>stand with RW and supervision and ambulated 18ft with RW and close supervision using  "hop to" pattern - stopped due to reports of dizziness that resolved with 2 minute seated rest break. Pt requested to work on upper body strengthening and performed BUE strengthening on UBE at level 3.5 alternating 1 minute forward and 1 minute backwards for a total of 6 minutes - required cues to pay attention to timer to alternate directions. Then, worked on Airline pilot using RUE x 4 trials - 2/4 trials standing on level ground and 2/4 trials standing on Airex with close supervision/CGA with generalized unsteadiness but no true LOB. Pt very concerned about staple removal and if they will be removed prior to D/C - secure chat sent to MD. Concluded session with pt sitting in recliner, needs within reach, and chair pad alarm on.   Treatment Session 2 Received pt sitting EOB, pt agreeable to  PT treatment, and reported pain 6/10 in L residual limb (premedicated). Repositioning, rest breaks, and distraction done to reduce pain levels. Session with focus on discharge planning, functional mobility, generalized strengthening, dynamic standing balance/coordination, simulated car transfers, ambulation, and improved activity tolerance. Pt performed bed mobility mod I and donned R shoe with setup assist. Pt transferred bed<>WC stand<>pivot with RW and supervision and performed WC mobility 186ft x 2 and 75 ft x 2 trials using BUE and supervision/mod I to/from room on 5C including navigating on/off elevator and performing tight turns. Pt performed ambulatory simulated car transfer with RW and supervision and ambulated 60ft on uneven surfaces (ramp) with RW and supervision. Pt then performed BUE and RLE strengthening on Nustep at workload 5 for 8 minutes for a total of 333 steps with emphasis on cardiovascular endurance with 3 rest breaks due to phantom limb spasms. Pt ambulated 47ft with RW and supervision back to room. Concluded session with pt sitting in recliner, needs within reach, and chair pad alarm on.   Therapy Documentation Precautions:  Precautions Precautions: Fall Precaution Comments: pain, monitor HR Restrictions Weight Bearing Restrictions: Yes LLE Weight Bearing: Non weight bearing  Therapy/Group: Individual Therapy Martin Majestic PT, DPT   12/21/2020, 7:14 AM

## 2020-12-22 MED ORDER — TRAMADOL HCL 50 MG PO TABS
50.0000 mg | ORAL_TABLET | Freq: Four times a day (QID) | ORAL | Status: DC | PRN
  Administered 2020-12-22 – 2020-12-23 (×3): 50 mg via ORAL
  Filled 2020-12-22 (×3): qty 1

## 2020-12-22 MED ORDER — CLONIDINE HCL 0.1 MG/24HR TD PTWK
0.1000 mg | MEDICATED_PATCH | TRANSDERMAL | Status: DC
  Administered 2020-12-22: 0.1 mg via TRANSDERMAL
  Filled 2020-12-22: qty 1

## 2020-12-22 MED ORDER — GABAPENTIN 300 MG PO CAPS
300.0000 mg | ORAL_CAPSULE | Freq: Two times a day (BID) | ORAL | Status: DC
  Administered 2020-12-22 – 2020-12-25 (×6): 300 mg via ORAL
  Filled 2020-12-22 (×6): qty 1

## 2020-12-22 MED ORDER — OXYCODONE-ACETAMINOPHEN 5-325 MG PO TABS
1.0000 | ORAL_TABLET | Freq: Four times a day (QID) | ORAL | Status: DC | PRN
  Administered 2020-12-22 – 2020-12-25 (×10): 2 via ORAL
  Filled 2020-12-22 (×10): qty 2

## 2020-12-22 NOTE — Progress Notes (Signed)
Physical Therapy Session Note  Patient Details  Name: IVA POSTEN MRN: [id number] Date of Birth: [birth year]  Today's Date: 12/22/2020 PT Individual Time: 3976-7341 PT Individual Time Calculation (min): 60 min   Short Term Goals: Week 1:  PT Short Term Goal 1 (Week 1): STG=LTG due to LOS  Skilled Therapeutic Interventions/Progress Updates:    Pt received reclined, sitting on EOB working on word search and agreeable to therapy session. Sitting EOB, pt donned R shoe without assist. Sit<>stands to/from EOB using RW mod-I. Short distance ambulatory transfer to w/c using RW mod-I. Pt requesting to go outside stating he has been feeling sad about his brother passing recently and wants to be in the sunshine. B UE w/c propulsion ~230ft with supervision and pt demoing slow propulsion speed with decreased efficiency - cuing for improvement. Pt requests to get snacks from vending machine therefore worked on sit<>stands and standing balance to complete this functional task using RW, completed mod-I. Pt reports no additional questions/concerns regarding D/C plan and is able to vocalize the plan to this therapist. B UE w/c propulsion ~225ft back up towards pt's room with pt becoming fatigued therefore providing dependent transport remainder of the distance. Short distance ~82ft ambulatory transfer w/c>EOB using RW mod-I. Pt left seated EOB with needs in reach and bed alarm on.  Therapy Documentation Precautions:  Precautions Precautions: Fall Precaution Comments: pain, monitor HR Restrictions Weight Bearing Restrictions: Yes LLE Weight Bearing: Non weight bearing  Pain: Reports L LE pain, unrated, due to nurse having just changed the dressing. Provided distraction and change of scenery for pain management.   Therapy/Group: Individual Therapy  Ginny Forth , PT, DPT, NCS, CSRS 12/22/2020, 6:21 PM

## 2020-12-22 NOTE — Progress Notes (Signed)
Patient ID: Benjamin [name], male   DOB: 09-Oct-1960, 60 y.o.   MRN: [id number] Met with pt and spoke with his sister-Moonie to update regarding awaiting insurance auth to be approved before can transfer to [place]. Both understand and sister is now back from her brother's funeral. Will call [facility] to see if any word from [facility]

## 2020-12-22 NOTE — Progress Notes (Signed)
Occupational Therapy Session Note  Patient Details  Name: CAYTON CUEVAS MRN: [id number] Date of Birth: 1960-09-29  Today's Date: 12/22/2020 OT Individual Time: 0900-0940 OT Individual Time Calculation (min): 40 min    Short Term Goals: Week 1:  OT Short Term Goal 1 (Week 1): STG = LTG due to ELOS  Skilled Therapeutic Interventions/Progress Updates:    Pt sleeping in bed upon arrival but easily aroused. Pt agreeable to sitting EOB and engaging in BADLs with sit<>stand from EOB. Bathing/dressing with sit<>stand from EOB for LB dressing tasks. Sit<>stand and standing balance with CGA. Stand pivot transfer to recliner with supervision. Pt pleased with progress. Continued education on LLE care and inspection. Pt remained in recliner with belt alarm activated and all needs within reach.   Therapy Documentation Precautions:  Precautions Precautions: Fall Precaution Comments: pain, monitor HR Restrictions Weight Bearing Restrictions: Yes LLE Weight Bearing: Non weight bearing  Pain: Pt denies pain this morning  Therapy/Group: Individual Therapy  Rich Brave 12/22/2020, 9:47 AM

## 2020-12-22 NOTE — Progress Notes (Signed)
Occupational Therapy Session Note  Patient Details  Name: Benjamin [name] MRN: [id number] Date of Birth: [birth year]  Today's Date: 12/22/2020 OT Individual Time: 1004-1057 OT Individual Time Calculation (min): 53 min    Short Term Goals: Week 1:  OT Short Term Goal 1 (Week 1): STG = LTG due to ELOS  Skilled Therapeutic Interventions/Progress Updates:  Skilled OT intervention completed with focus on dynamic reaching tasks and endurance. Pt received seated in recliner with NT in room, requesting for assistance to get back to bed due to lack of sleep. Pt agreeable to complete therapy session, then rest after session during long break. Pt c/o feeling groggy after receiving muscle relaxant, and reported his R shoulder was sore from exercises yesterday. Donned shoe on R foot with total A for time, then sit > stand with RW and ambulation to w/c in room with supervision assist. Self-propelled to next door therapy gym. Participated in 2 rounds (with intermittent seated break) of large connect 4 game, in standing using RW with supervision assist, and CGA at times during reaching the lower connect 4 pieces, however demonstrated great balance. Throughout game, pt would often miss when connect 4 was achieved and needed cues at time to initiate his turn, demonstrating decreased awareness and challenge with cognitive requirements of task, however still [facility]. Therapist set up horse shoes around room, at varying heights for pt to retrieve while ambulating using RW. Pt completed with supervision assist at all levels, with pt requiring increased time and verbal cues to recognize some of the higher placed horse shoes. Education provided on safety with using RW and managing items in hands while transferring, with suggestions of placing horse shoes on walker, or using RW bag/pockets for items within home or kitchen during home management tasks to promote BUE support on walker at all times when ambulating vs one for safety. Pt  able to toss all horse shoe in standing with one hand on walker at supervision level. While seated completed BUE strengthening with 5 pound dumbbell for tricep rows x15 on each arm and bicep flexion x15 each arm. Self propelled in w/c back to room, with pt left supine in bed, with supervision for bed mobility, and left in a position of comfort for pt to nap. Pt left with bed alarm on, and all needs in reach at end of session.  Therapy Documentation Precautions:  Precautions Precautions: Fall Precaution Comments: pain, monitor HR Restrictions Weight Bearing Restrictions: Yes LLE Weight Bearing: Non weight bearing  Pain: Unrated c/o of soreness in R shoulder. Pre-medicated. No intervention needed, improved with movement   Therapy/Group: Individual Therapy  [name] 12/22/2020, 7:26 AM

## 2020-12-22 NOTE — Progress Notes (Signed)
PROGRESS NOTE   Subjective/Complaints: Appreciate Becky's work and note in getting him SNF placement as per patient's request He continues to feel phantom limb pain- agreeable to starting gabapentin during the day as well.    ROS:  Pt denies SOB, abd pain, CP, N/V/C/D, and vision changes, insomnia improved, +phantom limb pain  Objective:   No results found. Recent Labs    12/21/20 0605  WBC 6.7  HGB 7.5*  HCT 23.2*  PLT 644*    Recent Labs    12/21/20 0605  NA 136  K 3.3*  CL 102  CO2 26  GLUCOSE 95  BUN 13  CREATININE 0.68  CALCIUM 8.6*     Intake/Output Summary (Last 24 hours) at 12/22/2020 1253 Last data filed at 12/22/2020 0800 Gross per 24 hour  Intake 1200 ml  Output 1500 ml  Net -300 ml        Physical Exam: Vital Signs Blood pressure (!) 155/89, pulse 100, temperature 98.6 F (37 C), temperature source Oral, resp. rate 18, height 5\' 7"  (1.702 m), weight 52.3 kg, SpO2 97 %. Gen: no distress, normal appearing HEENT: oral mucosa pink and moist, NCAT Cardio: Reg rate Chest: normal effort, normal rate of breathing Abd: soft, non-distended Ext: no edema Psych: pleasant, normal affect Skin: intact  Musculoskeletal:     Cervical back: Normal range of motion. No rigidity.     Comments: Hight L-AKA with dry dressing.C/D/I MS: UE strength 5/5; RLE 5/5 LLE- didn't test due to high AKA except could lift against gravity- HF  Skin:    Comments: Pt didn't allow me to assess- since "just walked- is throbbing- asked for pain meds L groin incision assessed - healed by moist- no yeast seen   R foot- has DP pulse- trace; not PT pulse- is warm- no skin breakdown.  On RLE Neurological:     Mental Status: He is alert and oriented to person, place, and time.     Comments: Intact to light touch in UE and RLE-  Psychiatric:        Mood and Affect: Mood normal.        Behavior: Behavior normal.     Assessment/Plan: 1. Functional deficits which require 3+ hours per day of interdisciplinary therapy in a comprehensive inpatient rehab setting. Physiatrist is providing close team supervision and 24 hour management of active medical problems listed below. Physiatrist and rehab team continue to assess barriers to discharge/monitor patient progress toward functional and medical goals  Care Tool:  Bathing    Body parts bathed by patient: Right arm, Left arm, Chest, Abdomen, Front perineal area, Buttocks, Right upper leg, Face, Left upper leg, Right lower leg   Body parts bathed by helper: Right lower leg Body parts n/a: Left upper leg   Bathing assist Assist Level: Supervision/Verbal cueing     Upper Body Dressing/Undressing Upper body dressing   What is the patient wearing?: Pull over shirt    Upper body assist Assist Level: Supervision/Verbal cueing    Lower Body Dressing/Undressing Lower body dressing      What is the patient wearing?: Underwear/pull up, Pants     Lower body assist Assist for lower  body dressing: Supervision/Verbal cueing     Toileting Toileting    Toileting assist Assist for toileting: Supervision/Verbal cueing Assistive Device Comment: BSC   Transfers Chair/bed transfer  Transfers assist     Chair/bed transfer assist level: Supervision/Verbal cueing     Locomotion Ambulation   Ambulation assist      Assist level: Supervision/Verbal cueing Assistive device: Walker-rolling Max distance: 44ft   Walk 10 feet activity   Assist     Assist level: Supervision/Verbal cueing Assistive device: Walker-rolling   Walk 50 feet activity   Assist Walk 50 feet with 2 turns activity did not occur: Safety/medical concerns (fatigue, pain, decreased balance, weakness/deconditioning)  Assist level: Supervision/Verbal cueing Assistive device: Walker-rolling    Walk 150 feet activity   Assist Walk 150 feet activity did not occur:  Safety/medical concerns (fatigue, pain, decreased balance, weakness/deconditioning)         Walk 10 feet on uneven surface  activity   Assist Walk 10 feet on uneven surfaces activity did not occur: Safety/medical concerns (fatigue, pain, decreased balance, weakness/deconditioning)   Assist level: Contact Guard/Touching assist Assistive device: Walker-rolling   Wheelchair     Assist Is the patient using a wheelchair?: Yes Type of Wheelchair: Manual    Wheelchair assist level: Independent Max wheelchair distance: 1101ft    Wheelchair 50 feet with 2 turns activity    Assist        Assist Level: Independent   Wheelchair 150 feet activity     Assist      Assist Level: Independent   Blood pressure (!) 155/89, pulse 100, temperature 98.6 F (37 C), temperature source Oral, resp. rate 18, height 5\' 7"  (1.702 m), weight 52.3 kg, SpO2 97 %.  Medical Problem List and Plan: 1.  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  PAD/Antithrombotics: Continue Lovenox             -antiplatelet therapy: Plavix 3. Phantom limb pain: Increase gabapentin to 300mg  BID. Oxycodone prn for severe pain and ultram prn for moderate pain 4. Mood: LCSW to follow for evaluation and support.              -antipsychotic agents: N/A 5. Neuropsych: This patient is capable of making decisions on his own behalf. 6. Skin/Wound Care: Monitor wound for healing.   --On Zinc, vitamin C, boost and prosource to promote healing.  7. Fluids/Electrolytes/Nutrition: Monitor I/O.  8.  GERD: Continue Protonix 9. Acute blood loss anemia: Recheck CBC in am.  10 Tobacco abuse: Continue to encourage tobacco cessation.              --continue nicotine patch to prevent withdrawal.  11. Hyponatremia: stable, monitor as needed  12.  Prediabetes: Hgb A1C- 6.0. Add CM restrictions to diet as fasting BS 107-150 range             --Monitor CBGs daily in am.  13. Elevated A phos: Likely due to infection. Recheck LFTs in am.  14. HTN: Cozaar remains on hold.  --Continue Coreg bid   Add clonidine patch for HTN and pain 15. Low protein levels: encouraged high protein diet.  16. Hypokalemia: supplement with klor today 17. Disposition: and April  to schedule HFU.    LOS: 7 days A FACE TO FACE EVALUATION WAS PERFORMED  Drema Pry Laterrance Nauta 12/22/2020, 12:53 PM

## 2020-12-22 NOTE — Progress Notes (Signed)
Occupational Therapy Discharge Summary  Patient Details  Name: Benjamin [name] MRN: [id number] Date of Birth: April 21, 1960  Patient has met 10 of 10 long term goals due to improved activity tolerance, improved balance, postural control, ability to compensate for deficits, and improved awareness. Patient to discharge at a wheelchair level Modified Independent. Patient's care partner unavailable to provide the necessary physical and cognitive assistance at discharge. Pt/pt's sister have discussed that the best option is to D/C to SNF for continued care.   Reasons goals not met: n/a  Recommendation:  No f/u OT recommended, however pt and pt's sister have decided pt will D/C to SNF for continued care.   Equipment: No equipment provided  Reasons for discharge: treatment goals met  Patient/family agrees with progress made and goals achieved: Yes  OT Discharge Precautions/Restrictions  Precautions Precautions: Fall Restrictions Weight Bearing Restrictions: Yes LLE Weight Bearing: Non weight bearing ADL ADL Equipment Provided: Long-handled sponge Eating: Independent Where Assessed-Eating: Edge of bed Grooming: Supervision/safety Where Assessed-Grooming: Standing at sink Upper Body Bathing: Setup Where Assessed-Upper Body Bathing: Edge of bed Lower Body Bathing: Supervision/safety Where Assessed-Lower Body Bathing: Edge of bed Upper Body Dressing: Setup Where Assessed-Upper Body Dressing: Edge of bed Lower Body Dressing: Supervision/safety Where Assessed-Lower Body Dressing: Edge of bed Toileting: Supervision/safety Where Assessed-Toileting: Bedside Commode Toilet Transfer: Close supervision Toilet Transfer Method: Stand pivot Science writer: Radiographer, therapeutic: Close [place] Transfer: Close supervision Social research officer, government Method: Management consultant: Radio broadcast assistant, Grab bars Vision Baseline Vision/History: 0  No visual deficits Patient Visual Report: No change from baseline Vision Assessment?: No apparent visual deficits Perception  Perception: Within Functional Limits Praxis Praxis: Intact Cognition Overall Cognitive Status: Within Functional Limits for tasks assessed Arousal/Alertness: Awake/alert Orientation Level: Oriented X4 Year: 2022 Month: October Day of Week: Correct Immediate Memory Recall: Sock;Blue;Bed Memory Recall Sock: Without Cue Memory Recall Blue: Without Cue Memory Recall Bed: Without Cue Awareness: Appears intact Problem Solving: Impaired Safety/Judgment: Appears intact Sensation Sensation Light Touch: Appears Intact Proprioception: Appears Intact Coordination Gross Motor Movements are Fluid and Coordinated: Yes Fine Motor Movements are Fluid and Coordinated: No Finger Nose Finger Test: slow bilaterally Motor  Motor Motor: Within Functional Limits Motor - Skilled Clinical Observations: mild uncoordination due to pain, decreased balance/postural control, and generalized weakness Mobility  Bed Mobility Bed Mobility: Rolling Right;Rolling Left;Sit to Supine;Supine to Sit Rolling Right: Independent with assistive device Rolling Left: Independent with assistive device Supine to Sit: Independent with assistive device Sit to Supine: Independent with assistive device Transfers Sit to Stand: Independent with assistive device Stand to Sit: Independent with assistive device  Trunk/Postural Assessment  Cervical Assessment Cervical Assessment: Exceptions to [facility] (forward head) Thoracic Assessment Thoracic Assessment: Exceptions to [facility] (kyphosis) Lumbar Assessment Lumbar Assessment: Exceptions to [facility] (posterior pelvic tilt) Postural Control Postural Control: Within Functional Limits  Balance Balance Balance Assessed: Yes Static Sitting Balance Static Sitting - Balance Support: Feet supported;No upper extremity supported Static Sitting - Level of Assistance: 7:  Independent Dynamic Sitting Balance Dynamic Sitting - Balance Support: Feet supported;No upper extremity supported Dynamic Sitting - Level of Assistance: 7: Independent Static Standing Balance Static Standing - Balance Support: Bilateral upper extremity supported (RW) Static Standing - Level of Assistance: 6: Modified independent (Device/Increase time) Dynamic Standing Balance Dynamic Standing - Balance Support: Bilateral upper extremity supported (RW) Dynamic Standing - Level of Assistance: 6: Modified independent (Device/Increase time) Extremity/Trunk Assessment RUE Assessment RUE Assessment: Within Functional Limits General Strength Comments:  4+/5 grossly, R>L LUE Assessment LUE Assessment: Within Functional Limits General Strength Comments: 4/5 grossly, L<R   Louie Meaders E Sriram Febles 12/22/2020, 4:15 PM

## 2020-12-22 NOTE — Progress Notes (Signed)
Occupational Therapy Session Note  Patient Details  Name: SHYAM DAWSON MRN: [id number] Date of Birth: 1960-10-22  Today's Date: 12/22/2020 OT Individual Time: 1400-1425 OT Individual Time Calculation (min): 25 min    Short Term Goals: Week 1:  OT Short Term Goal 1 (Week 1): STG = LTG due to ELOS  Skilled Therapeutic Interventions/Progress Updates:    Pt sitting EOB upon arrival. OT intervention with focus on sit<>stand and standing balance, in addition to education on LLE care. Sit<>stand X 5 with supervision; standing balance activities 3x2 mins with supervisoin using UE for task. Eudcated on use of inspection mirror and self care for LLE.Pt verbalized understanding. Pt with questions about plans after discharge and if he will still have therapy Answered all questions to pt's satisfaction. Pt remained seated EOB with bed alarm activated. All needs within reach.   Therapy Documentation Precautions:  Precautions Precautions: Fall Precaution Comments: pain, monitor HR Restrictions Weight Bearing Restrictions: Yes LLE Weight Bearing: Non weight bearing Pain: Pt denies pain this afternoon; pt reports that phantom pain "gets bad" during night   Therapy/Group: Individual Therapy  Rich Brave 12/22/2020, 2:33 PM

## 2020-12-22 NOTE — Progress Notes (Addendum)
Patient ID: Benjamin [name], male   DOB: 1960/12/12, 60 y.o.   MRN: [id number]  Spoke with Orland Mustard covering for [facility] to discuss pt and see if can start insurance auth. Trying to get him to transfer by tomorrow. She will get back with worker once hears something. Spoke with Esell-CM at [facility] to give her a heads up and she will await call from facility.

## 2020-12-22 NOTE — Progress Notes (Signed)
Nutrition Follow-up  DOCUMENTATION CODES:   Severe malnutrition in context of chronic illness, Underweight  INTERVENTION:  - Encourage adequate PO intake  - Continue Boost Breeze po BID, each supplement provides 250 kcal and 9 grams of protein  - Continue 30 ml ProSource Plus BID, each supplement provides 100 kcals and 15 grams protein.   - Continue MVI with minerals daily  NUTRITION DIAGNOSIS:   Severe Malnutrition related to chronic illness (PAD and wound healing) as evidenced by severe fat depletion, severe muscle depletion.  On-going  GOAL:   Patient will meet greater than or equal to 90% of their needs  Met  MONITOR:   PO intake, Supplement acceptance, Labs, Weight trends, Skin  REASON FOR ASSESSMENT:   Other (Comment) (low BMI; history of malnutrition)    ASSESSMENT:   Pt admitted to inpatient rehab with functional decline d/t L AKA. PMH includes PAD s/p multiple BLE bypass grafts, tobacco use, L AKA in April 2022, L AKA revision/application of wound vac on 10/18.  Pt reports having a good appetite and feeling well. 100% meal completions. Nutrition Ambassador present taking meal orders. She states that with garlic allergy, it limits the foods he is trying to order. Pt states that garlic makes him sneeze but denies any rashes or anaphylaxis, however he feels like he is getting enough options for meals. Pt continues to consume [employer] and Prosource as ordered. Will continue with current interventions to aid in wound healing.   Medications: Vitamin C, colace, MVI with minerals daily, protonix, senokot, zinc  Labs: potassium 3.3  Diet Order:   Diet Order             Diet regular Room service appropriate? Yes; Fluid consistency: Thin  Diet effective now                   EDUCATION NEEDS:   Education needs have been addressed  Skin:  Skin Assessment: Skin Integrity Issues: Skin Integrity Issues:: Incisions Incisions: L thigh  Last BM:   12/21/20  Height:   Ht Readings from Last 1 Encounters:  12/15/20 5' 7"  (1.702 m)    Weight:   Wt Readings from Last 1 Encounters:  12/15/20 52.3 kg    BMI:  Body mass index is 18.06 kg/m.  Estimated Nutritional Needs:   Kcal:  1900-2100  Protein:  95-105g  Fluid:  >1.9L  Clayborne Dana, RDN, LDN Clinical Nutrition

## 2020-12-23 ENCOUNTER — Telehealth: Payer: Self-pay | Admitting: Licensed Clinical Social Worker

## 2020-12-23 LAB — BASIC METABOLIC PANEL
Anion gap: 8 (ref 5–15)
BUN: 12 mg/dL (ref 8–23)
CO2: 26 mmol/L (ref 22–32)
Calcium: 8.8 mg/dL — ABNORMAL LOW (ref 8.9–10.3)
Chloride: 99 mmol/L (ref 98–111)
Creatinine, Ser: 0.67 mg/dL (ref 0.61–1.24)
GFR, Estimated: >60 mL/min (ref >60)
Glucose, Bld: 98 mg/dL (ref 70–99)
Potassium: 4.1 mmol/L (ref 3.5–5.1)
Sodium: 133 mmol/L — ABNORMAL LOW (ref 135–145)

## 2020-12-23 NOTE — Progress Notes (Signed)
Patient ID: Benjamin [name], male   DOB: January 22, 1961, 60 y.o.   MRN: [id number]  messages left for [facility] and [place]-covering yesterday for Genevie Cheshire to find out status of insurance authorization for transfer to [place]. Await return call

## 2020-12-23 NOTE — Patient Care Conference (Signed)
Inpatient RehabilitationTeam Conference and Plan of Care Update Date: 12/23/2020   Time: 11:45 AM    Patient Name: Benjamin [name]      Medical Record Number: [id number]  Date of Birth: 03/27/1960 Sex: Male         Room/Bed: 5C18C/5C18C-01 Payor Info: Payor: [place] MEDICAID PREPAID HEALTH PLAN / Plan: [place] MEDICAID HEALTHY BLUE / Product Type: *No Product type* /    Admit Date/Time:  12/15/2020  4:18 PM  Primary Diagnosis:  Unilateral AKA, left [facility])  Hospital Problems: Principal Problem:   Unilateral AKA, left [facility])    Expected Discharge Date: Expected Discharge Date:  (SNF pending)  Team Members Present: Physician leading conference: Dr. Sula Soda Social Worker Present: Dossie Der, LCSW Nurse Present: Chana Bode, RN PT Present: Malachi Pro, PT OT Present: Other (comment) [facility], OT) PPS Coordinator present : Fae Pippin, SLP     Current Status/Progress Goal Weekly Team Focus  Bowel/Bladder     Continent        Swallow/Nutrition/ Hydration             ADL's   Supervision grooming in standing at sink with RW, supervision transfers with RW, supervision B/D, supervision toileting  Mod I-supervision  ADL retraining, residual limb care, endurance, BUE/core strengthening, positioning of residual limb   Mobility   bed mobility mod I, transfers with RW supervision, gait 50ft with RW supervision, WC mobility 145ft mod I  Mod I/supervision  functional mobility/transfers, generalized strengthening, dynamic standing balance/coordination, gait training, amputee education, D/C planning, and imporved activity tolerance.   Communication             Safety/Cognition/ Behavioral Observations            Pain     Left leg; phantom pain   Pain managed with prn meds   Monitor need for and effectiveness of meds  Skin     Incision left leg   Pain can manage dressing   Dressing change education and practice    Discharge Planning:  Sister and pt have decided best option is to go  to a SNF to continue his healing. Bed offer and accepted from [place] awaiting insurance to approve, so can transfer, if not will DC to sister's   Team Discussion: Pain struggling with self care, diet, etc.  Patient on target to meet rehab goals: yes, needs supervision for ADLs and toileting. Able to ambulate with a walker up to 96' and mod assist.   *See Care Plan and progress notes for long and short-term goals.   Revisions to Treatment Plan:  None  Teaching Needs: Skin care, dressing changes, medications, transfers, safety, etc, secondary risk management  Current Barriers to Discharge: Home enviroment access/layout and Lack of/limited family support  Possible Resolutions to Barriers: SNF recommended     Medical Summary Current Status: pain well controlled this morning  Barriers to Discharge: Medical stability;Wound care  Barriers to Discharge Comments: continues to have phantom> residual limb at times, AKA Possible Resolutions to Barriers/Weekly Focus: continue percocet, gabapentin, robaxin prn for pain/insomnia, continue to monitor wound daily, pending SNF acceptance   Continued Need for Acute Rehabilitation Level of Care: The patient requires daily medical management by a physician with specialized training in physical medicine and rehabilitation for the following reasons: Direction of a multidisciplinary physical rehabilitation program to maximize functional independence : Yes Medical management of patient stability for increased activity during participation in an intensive rehabilitation regime.: Yes Analysis of laboratory values and/or radiology  reports with any subsequent need for medication adjustment and/or medical intervention. : Yes   I attest that I was present, lead the team conference, and concur with the assessment and plan of the team.   Chana Bode B 12/23/2020, 3:53 PM

## 2020-12-23 NOTE — Progress Notes (Addendum)
Patient ID: [name], male   DOB: 11/27/1960, 60 y.o.   MRN: [id number] Benjamin [name] again and now voice mail has said unavailable and to contact Brooke-[phone]. Attempting to find out status of pt's insurance approval so can work on transfer to facility. Will await return call. Made pt and sister aware of this plan.  3;22 PM Have left another message for [facility] regarding insurance update. Will await return call

## 2020-12-23 NOTE — Progress Notes (Signed)
PROGRESS NOTE   Subjective/Complaints: No complaints this morning Pain is better controlled Discussed awaiting SNF placement, appreciate SW work on this K+ improved to 4.1   ROS:  Pt denies SOB, abd pain, CP, N/V/C/D, and vision changes, insomnia improved, +phantom limb pain  Objective:   No results found. Recent Labs    12/21/20 0605  WBC 6.7  HGB 7.5*  HCT 23.2*  PLT 644*    Recent Labs    12/21/20 0605 12/23/20 0542  NA 136 133*  K 3.3* 4.1  CL 102 99  CO2 26 26  GLUCOSE 95 98  BUN 13 12  CREATININE 0.68 0.67  CALCIUM 8.6* 8.8*     Intake/Output Summary (Last 24 hours) at 12/23/2020 1308 Last data filed at 12/23/2020 0600 Gross per 24 hour  Intake 480 ml  Output 2600 ml  Net -2120 ml        Physical Exam: Vital Signs Blood pressure 132/89, pulse 84, temperature (!) 97.5 F (36.4 C), resp. rate 18, height 5\' 7"  (1.702 m), weight 56.5 kg, SpO2 98 %. Gen: no distress, normal appearing HEENT: oral mucosa pink and moist, NCAT Cardio: Reg rate Chest: normal effort, normal rate of breathing Abd: soft, non-distended Ext: no edema Psych: pleasant, normal affect Musculoskeletal:     Cervical back: Normal range of motion. No rigidity.     Comments: Hight L-AKA with dry dressing.C/D/I MS: UE strength 5/5; RLE 5/5 LLE- didn't test due to high AKA except could lift against gravity- HF  Skin:    Comments: Pt didn't allow me to assess- since "just walked- is throbbing- asked for pain meds L groin incision assessed - healed by moist- no yeast seen   R foot- has DP pulse- trace; not PT pulse- is warm- no skin breakdown.  On RLE Neurological:     Mental Status: He is alert and oriented to person, place, and time.     Comments: Intact to light touch in UE and RLE-  Psychiatric:        Mood and Affect: Mood normal.        Behavior: Behavior normal.    Assessment/Plan: 1. Functional deficits which  require 3+ hours per day of interdisciplinary therapy in a comprehensive inpatient rehab setting. Physiatrist is providing close team supervision and 24 hour management of active medical problems listed below. Physiatrist and rehab team continue to assess barriers to discharge/monitor patient progress toward functional and medical goals  Care Tool:  Bathing    Body parts bathed by patient: Right arm, Left arm, Chest, Abdomen, Front perineal area, Buttocks, Right upper leg, Face, Left upper leg, Right lower leg   Body parts bathed by helper: Right lower leg Body parts n/a: Left upper leg   Bathing assist Assist Level: Supervision/Verbal cueing     Upper Body Dressing/Undressing Upper body dressing   What is the patient wearing?: Pull over shirt    Upper body assist Assist Level: Supervision/Verbal cueing    Lower Body Dressing/Undressing Lower body dressing      What is the patient wearing?: Underwear/pull up, Pants     Lower body assist Assist for lower body dressing: Supervision/Verbal cueing  Toileting Toileting    Toileting assist Assist for toileting: Supervision/Verbal cueing Assistive Device Comment: BSC   Transfers Chair/bed transfer  Transfers assist     Chair/bed transfer assist level: Independent with assistive device Chair/bed transfer assistive device: Geologist, engineering   Ambulation assist      Assist level: Independent with assistive device Assistive device: Walker-rolling Max distance: 11ft   Walk 10 feet activity   Assist     Assist level: Independent with assistive device Assistive device: Walker-rolling   Walk 50 feet activity   Assist Walk 50 feet with 2 turns activity did not occur: Safety/medical concerns (fatigue, pain, decreased balance, weakness/deconditioning)  Assist level: Independent with assistive device Assistive device: Walker-rolling    Walk 150 feet activity   Assist Walk 150 feet activity  did not occur: Safety/medical concerns         Walk 10 feet on uneven surface  activity   Assist Walk 10 feet on uneven surfaces activity did not occur: Safety/medical concerns (fatigue, pain, decreased balance, weakness/deconditioning)   Assist level: Supervision/Verbal cueing Assistive device: Walker-rolling   Wheelchair     Assist Is the patient using a wheelchair?: Yes Type of Wheelchair: Manual    Wheelchair assist level: Independent, Set up assist Max wheelchair distance: 131ft    Wheelchair 50 feet with 2 turns activity    Assist        Assist Level: Independent   Wheelchair 150 feet activity     Assist      Assist Level: Independent   Blood pressure 132/89, pulse 84, temperature (!) 97.5 F (36.4 C), resp. rate 18, height 5\' 7"  (1.702 m), weight 56.5 kg, SpO2 98 %.  Medical Problem List and Plan: 1.  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  PAD/Antithrombotics: Continue Lovenox             -antiplatelet therapy: Plavix 3. Phantom limb pain: Continue gabapentin to 300mg  BID. Oxycodone prn for severe pain and ultram prn for moderate pain 4. Mood: LCSW to follow for evaluation and support.              -antipsychotic agents: N/A 5. Neuropsych: This patient is capable of making decisions on his own behalf. 6. Skin/Wound Care: Monitor wound for healing.   --On Zinc, vitamin C, boost and prosource to promote healing.  7. Fluids/Electrolytes/Nutrition: Monitor I/O.  8.  GERD: Continue Protonix 9. Acute blood loss anemia: Recheck CBC in am.  10 Tobacco abuse: Continue to encourage tobacco cessation.              --continue nicotine patch to prevent withdrawal.  11. Hyponatremia: stable, monitor as needed  12. Prediabetes: Hgb A1C-  6.0. Add CM restrictions to diet as fasting BS 107-150 range             --Monitor CBGs daily in am.  13. Elevated A phos: Likely due to infection. Recheck LFTs in am.  14. HTN: Cozaar remains on hold.  --Continue Coreg bid   Add clonidine patch for HTN and pain- much better control of both- continue 15. Low protein levels: encouraged high protein diet.  16. Hypokalemia: supplement  with klor today 17. Disposition: HFU with me in December     LOS: 8 days A FACE TO FACE EVALUATION WAS PERFORMED  Nayan Proch P Quavon Keisling 12/23/2020, 1:08 PM

## 2020-12-23 NOTE — Progress Notes (Signed)
Inpatient Rehabilitation Discharge Medication Review by a Pharmacist  A complete drug regimen review was completed for this patient to identify any potential clinically significant medication issues.  High Risk Drug Classes Is patient taking? Indication by Medication  Antipsychotic No   Anticoagulant No   Antibiotic No   Opioid Yes Tramadol/percocet for pain  Antiplatelet Yes Plavix for PAD  Hypoglycemics/insulin No   Vasoactive Medication Yes Carvedilol for BP / clonidine patch for BP / pain  Chemotherapy No   Other No      Type of Medication Issue Identified Description of Issue Recommendation(s)  Drug Interaction(s) (clinically significant)     Duplicate Therapy     Allergy     No Medication Administration End Date     Incorrect Dose     Additional Drug Therapy Needed     Significant med changes from prior encounter (inform family/care partners about these prior to discharge).    Other       Clinically significant medication issues were identified that warrant physician communication and completion of prescribed/recommended actions by midnight of the next day:  No  Pharmacist comments: No issues identified  Time spent performing this drug regimen review (minutes):  20 minutes   Elwin Sleight 12/23/2020 9:53 AM

## 2020-12-23 NOTE — Progress Notes (Signed)
Patient remains alert and oriented. Encouraged to rest after watching TV through the night. Sleep, pain, and muscle relaxant medication offered with good effect. Safety ensured at all times.

## 2020-12-23 NOTE — Telephone Encounter (Signed)
LCSW received a call from pt sister [name] [phone]). [name] just f/u to let me know about the funeral and check on any updates from pt. Pt was confused as to what the discharge timing was going to be as "he got discharge papers yesterday." I shared that it appears per notes that they are continuing to await a determination for insurance for plan of care. If approved then it would be dependent on [facility]'s readiness and bed availability for discharge. If denied then pt would be discharging home with what home supports CIR would be able to arrange. Pt sister states understanding, aware that any further updates would come from [place], CIR LCSW. Sent the above to [facility] to make her aware of call. Remain available for outpatient support as needed.   [name], MSW, LCSW [facility]/V[facility] Navigation  [phone]

## 2020-12-24 NOTE — Progress Notes (Addendum)
  Patient ID: Benjamin [name], male   DOB: Nov 26, 1960, 60 y.o.   MRN: [id number]  Spoke with [facility] to inquire about SNF approval. She reports the SNF has not contacted them to pursue approval. Will need to pursue another facility will talk with pt and sister. Regional manager from [facility] never called this worker back  9:02 AM Spoke with pt and sister who are agreeable to pursuing [facility]. Message left for admissions at [facility]. Both pt and sister aware this may not be approved.  11:35 AM Second message left with [facility] admissions  2:48 Pm Have left another message for admissions at [facility]. Will ask to speak to the administrator. Pt is aware of this and concerned has not been getting any therapy the past two days. Will ask therapy supervisor about this  3;16 PM Spoke with Crystal-Admissions at [facility] who reports they have beds, gave information for [facility] and she will call her and start insurance auth. Will touch base in morning

## 2020-12-24 NOTE — Progress Notes (Signed)
PROGRESS NOTE   Subjective/Complaints: Sleepy this morning Looking into another facility for him as we never heard back from the first one    ROS:  Pt denies SOB, abd pain, CP, N/V/C/D, and vision changes, insomnia improved, +phantom limb pain  Objective:   No results found. No results for input(s): WBC, HGB, HCT, PLT in the last 72 hours.   Recent Labs    12/23/20 0542  NA 133*  K 4.1  CL 99  CO2 26  GLUCOSE 98  BUN 12  CREATININE 0.67  CALCIUM 8.8*     Intake/Output Summary (Last 24 hours) at 12/24/2020 1018 Last data filed at 12/24/2020 0202 Gross per 24 hour  Intake 480 ml  Output 975 ml  Net -495 ml        Physical Exam: Vital Signs Blood pressure 125/76, pulse 94, temperature 98.9 F (37.2 C), temperature source Oral, resp. rate 18, height 5\' 7"  (1.702 m), weight 56.5 kg, SpO2 96 %. Gen: no distress, normal appearing HEENT: oral mucosa pink and moist, NCAT Cardio: Reg rate Chest: normal effort, normal rate of breathing Abd: soft, non-distended Ext: no edema Psych: pleasant, normal affect Skin: intact Musculoskeletal:     Cervical back: Normal range of motion. No rigidity.     Comments: Hight L-AKA with dry dressing.C/D/I MS: UE strength 5/5; RLE 5/5 LLE- didn't test due to high AKA except could lift against gravity- HF  Skin:    Comments: Pt didn't allow me to assess- since "just walked- is throbbing- asked for pain meds L groin incision assessed - healed by moist- no yeast seen   R foot- has DP pulse- trace; not PT pulse- is warm- no skin breakdown.  On RLE Neurological:     Mental Status: He is alert and oriented to person, place, and time.     Comments: Intact to light touch in UE and RLE-  Psychiatric:        Mood and Affect: Mood normal.        Behavior: Behavior normal.    Assessment/Plan: 1. Functional deficits which require 3+ hours per day of interdisciplinary therapy in a  comprehensive inpatient rehab setting. Physiatrist is providing close team supervision and 24 hour management of active medical problems listed below. Physiatrist and rehab team continue to assess barriers to discharge/monitor patient progress toward functional and medical goals  Care Tool:  Bathing    Body parts bathed by patient: Right arm, Left arm, Chest, Abdomen, Front perineal area, Buttocks, Right upper leg, Face, Left upper leg, Right lower leg   Body parts bathed by helper: Right lower leg Body parts n/a: Left upper leg   Bathing assist Assist Level: Supervision/Verbal cueing     Upper Body Dressing/Undressing Upper body dressing   What is the patient wearing?: Pull over shirt    Upper body assist Assist Level: Supervision/Verbal cueing    Lower Body Dressing/Undressing Lower body dressing      What is the patient wearing?: Underwear/pull up, Pants     Lower body assist Assist for lower body dressing: Supervision/Verbal cueing     Toileting Toileting    Toileting assist Assist for toileting: Supervision/Verbal cueing Assistive  Device Comment: BSC   Transfers Chair/bed transfer  Transfers assist     Chair/bed transfer assist level: Independent with assistive device Chair/bed transfer assistive device: Geologist, engineering   Ambulation assist      Assist level: Independent with assistive device Assistive device: Walker-rolling Max distance: 29ft   Walk 10 feet activity   Assist     Assist level: Independent with assistive device Assistive device: Walker-rolling   Walk 50 feet activity   Assist Walk 50 feet with 2 turns activity did not occur: Safety/medical concerns (fatigue, pain, decreased balance, weakness/deconditioning)  Assist level: Independent with assistive device Assistive device: Walker-rolling    Walk 150 feet activity   Assist Walk 150 feet activity did not occur: Safety/medical concerns         Walk  10 feet on uneven surface  activity   Assist Walk 10 feet on uneven surfaces activity did not occur: Safety/medical concerns (fatigue, pain, decreased balance, weakness/deconditioning)   Assist level: Supervision/Verbal cueing Assistive device: Walker-rolling   Wheelchair     Assist Is the patient using a wheelchair?: Yes Type of Wheelchair: Manual    Wheelchair assist level: Independent, Set up assist Max wheelchair distance: 17ft    Wheelchair 50 feet with 2 turns activity    Assist        Assist Level: Independent   Wheelchair 150 feet activity     Assist      Assist Level: Independent   Blood pressure 125/76, pulse 94, temperature 98.9 F (37.2 C), temperature source Oral, resp. rate 18, height 5\' 7"  (1.702 m), weight 56.5 kg, SpO2 96 %.  Medical Problem List and Plan: 1.  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  PAD/Antithrombotics: Continue Lovenox             -antiplatelet therapy: Plavix 3. Phantom limb pain: Continue gabapentin to 300mg  BID. Oxycodone prn for severe pain and ultram prn for moderate pain 4. Mood: LCSW to follow for evaluation and support.              -antipsychotic agents: N/A 5. Neuropsych: This patient is capable of making decisions on his own behalf. 6. Skin/Wound Care: Monitor wound for healing.   --On Zinc, vitamin C, boost and prosource to promote healing.  7. Fluids/Electrolytes/Nutrition: Monitor I/O.  8.  GERD: Continue Protonix 9. Acute blood loss anemia: Recheck CBC in am.  10 Tobacco abuse: Continue to encourage tobacco cessation.              --continue nicotine patch to prevent withdrawal.  11. Hyponatremia: stable, monitor as needed  12. Prediabetes: Hgb A1C- 6.0. Add CM restrictions to diet as fasting BS 107-150 range. No need for  additional CBG monitoring 13. Elevated A phos: Likely due to infection. Recheck LFTs in am.  14. HTN: Cozaar remains on hold.  --Continue Coreg bid   Add clonidine patch for HTN and pain- much better control of both- continue 15. Low protein levels: encouraged high protein diet.  16. Hypokalemia: supplement with klor today 17. Disposition: HFU with me in December     LOS: 9 days A FACE TO FACE EVALUATION WAS PERFORMED  Chyan Carnero P  Jaidah Lomax 12/24/2020, 10:18 AM

## 2020-12-25 ENCOUNTER — Other Ambulatory Visit (HOSPITAL_COMMUNITY): Payer: Self-pay

## 2020-12-25 DIAGNOSIS — G546 Phantom limb syndrome with pain: Secondary | ICD-10-CM

## 2020-12-25 DIAGNOSIS — E871 Hypo-osmolality and hyponatremia: Secondary | ICD-10-CM

## 2020-12-25 DIAGNOSIS — D62 Acute posthemorrhagic anemia: Secondary | ICD-10-CM

## 2020-12-25 LAB — RESP PANEL BY RT-PCR (FLU A&B, COVID) ARPGX2
Influenza A by PCR: NEGATIVE
Influenza B by PCR: NEGATIVE
SARS Coronavirus 2 by RT PCR: NEGATIVE

## 2020-12-25 LAB — CBC
HCT: 28.6 % — ABNORMAL LOW (ref 39.0–52.0)
Hemoglobin: 9 g/dL — ABNORMAL LOW (ref 13.0–17.0)
MCH: 24.1 pg — ABNORMAL LOW (ref 26.0–34.0)
MCHC: 31.5 g/dL (ref 30.0–36.0)
MCV: 76.7 fL — ABNORMAL LOW (ref 80.0–100.0)
Platelets: 692 10*3/uL — ABNORMAL HIGH (ref 150–400)
RBC: 3.73 MIL/uL — ABNORMAL LOW (ref 4.22–5.81)
RDW: 20.5 % — ABNORMAL HIGH (ref 11.5–15.5)
WBC: 5.5 10*3/uL (ref 4.0–10.5)
nRBC: 0 % (ref 0.0–0.2)

## 2020-12-25 MED ORDER — DOCUSATE SODIUM 100 MG PO CAPS
100.0000 mg | ORAL_CAPSULE | Freq: Every day | ORAL | 0 refills | Status: DC
  Filled 2020-12-25: qty 30, 30d supply, fill #0

## 2020-12-25 MED ORDER — TRAZODONE HCL 50 MG PO TABS
25.0000 mg | ORAL_TABLET | Freq: Every evening | ORAL | 0 refills | Status: DC | PRN
  Filled 2020-12-25: qty 30, 30d supply, fill #0

## 2020-12-25 MED ORDER — GABAPENTIN 400 MG PO CAPS: 400.0000 mg | ORAL_CAPSULE | Freq: Two times a day (BID) | ORAL | Status: DC

## 2020-12-25 MED ORDER — METHOCARBAMOL 500 MG PO TABS
500.0000 mg | ORAL_TABLET | Freq: Four times a day (QID) | ORAL | 0 refills | Status: DC | PRN
  Filled 2020-12-25 – 2021-03-23 (×2): qty 30, 8d supply, fill #0

## 2020-12-25 MED ORDER — TRAMADOL HCL 50 MG PO TABS
50.0000 mg | ORAL_TABLET | Freq: Two times a day (BID) | ORAL | 0 refills | Status: DC | PRN
  Filled 2020-12-25: qty 10, 5d supply, fill #0

## 2020-12-25 MED ORDER — ZINC SULFATE 220 (50 ZN) MG PO TABS
220.0000 mg | ORAL_TABLET | Freq: Every day | ORAL | 0 refills | Status: DC
  Filled 2020-12-25: qty 30, 30d supply, fill #0

## 2020-12-25 MED ORDER — ACETAMINOPHEN 325 MG PO TABS
325.0000 mg | ORAL_TABLET | ORAL | 0 refills | Status: DC | PRN
  Filled 2020-12-25: qty 60, 5d supply, fill #0

## 2020-12-25 MED ORDER — ASCORBIC ACID 500 MG PO TABS
500.0000 mg | ORAL_TABLET | Freq: Every day | ORAL | 0 refills | Status: DC
  Filled 2020-12-25: qty 30, 30d supply, fill #0

## 2020-12-25 MED ORDER — OXYCODONE-ACETAMINOPHEN 5-325 MG PO TABS: 1.0000 | ORAL_TABLET | Freq: Three times a day (TID) | ORAL | 0 refills | Status: DC | PRN

## 2020-12-25 MED ORDER — GABAPENTIN 400 MG PO CAPS
400.0000 mg | ORAL_CAPSULE | Freq: Two times a day (BID) | ORAL | Status: DC
  Administered 2020-12-25: 400 mg via ORAL
  Filled 2020-12-25: qty 1

## 2020-12-25 MED ORDER — PANTOPRAZOLE SODIUM 40 MG PO TBEC
40.0000 mg | DELAYED_RELEASE_TABLET | Freq: Every day | ORAL | 0 refills | Status: DC
  Filled 2020-12-25: qty 30, 30d supply, fill #0

## 2020-12-25 MED ORDER — OXYCODONE-ACETAMINOPHEN 5-325 MG PO TABS
1.0000 | ORAL_TABLET | Freq: Three times a day (TID) | ORAL | 0 refills | Status: DC | PRN
  Filled 2020-12-25: qty 35, 5d supply, fill #0

## 2020-12-25 MED ORDER — TRAMADOL HCL 50 MG PO TABS: 50.0000 mg | ORAL_TABLET | Freq: Two times a day (BID) | ORAL | 0 refills | Status: DC | PRN

## 2020-12-25 MED ORDER — NICOTINE 21 MG/24HR TD PT24
21.0000 mg | MEDICATED_PATCH | Freq: Every day | TRANSDERMAL | 0 refills | Status: DC
  Filled 2020-12-25: qty 28, 28d supply, fill #0

## 2020-12-25 MED ORDER — GABAPENTIN 300 MG PO CAPS
300.0000 mg | ORAL_CAPSULE | Freq: Two times a day (BID) | ORAL | 0 refills | Status: DC
  Filled 2020-12-25: qty 60, 30d supply, fill #0

## 2020-12-25 MED ORDER — CARVEDILOL 6.25 MG PO TABS
6.2500 mg | ORAL_TABLET | Freq: Two times a day (BID) | ORAL | 0 refills | Status: DC
  Filled 2020-12-25: qty 60, 30d supply, fill #0

## 2020-12-25 MED ORDER — HYDROXYZINE HCL 25 MG PO TABS
25.0000 mg | ORAL_TABLET | Freq: Every evening | ORAL | 0 refills | Status: DC | PRN
  Filled 2020-12-25: qty 30, 30d supply, fill #0

## 2020-12-25 MED ORDER — SENNA 8.6 MG PO TABS
1.0000 | ORAL_TABLET | Freq: Two times a day (BID) | ORAL | 0 refills | Status: DC
  Filled 2020-12-25: qty 60, 30d supply, fill #0

## 2020-12-25 MED ORDER — CLONIDINE 0.1 MG/24HR TD PTWK
0.1000 mg | MEDICATED_PATCH | TRANSDERMAL | 0 refills | Status: DC
  Filled 2020-12-25: qty 4, 28d supply, fill #0

## 2020-12-25 NOTE — Progress Notes (Addendum)
Nurse to Nurse Report  This nurse spoke with Wandra Mannan [facility]) and gave a report on patient prior to admission. Tameka stated she noted patient's lab result via epic.

## 2020-12-25 NOTE — Discharge Instructions (Addendum)
       Inpatient Rehab Discharge Instructions  [facility] Discharge date and time: 12/25/20   Activities/Precautions/ Functional Status: Activity: activity as tolerated Diet: low fat, low cholesterol diet Wound Care: keep wound clean and dry   Functional status:  ___ No restrictions     ___ Walk up steps independently _X__ 24/7 supervision/assistance   ___ Walk up steps with assistance ___ Intermittent supervision/assistance  ___ Bathe/dress independently ___ Walk with walker     ___ Bathe/dress with assistance ___ Walk Independently    ___ Shower independently ___ Walk with assistance    ___ Shower with assistance _X__ No alcohol     ___ Return to work/school ________   Special Instructions:    My questions have been answered and I understand these instructions. I will adhere to these goals and the provided educational materials after my discharge from the hospital.  Patient/Caregiver Signature _______________________________ Date __________  Clinician Signature _______________________________________ Date __________  Please bring this form and your medication list with you to all your follow-up doctor's appointments.  COMMUNITY REFERRALS UPON DISCHARGE:    HOME EXERCISE PROGRAM RECOMMEND OUTPATIENT PT ONCE ABLE TO GET PROTHESIS  Medical Equipment/Items Ordered:HAS ALL NEEDED EQUIPMENT FROM PAST ADMISSIONS                                                 Agency/Supplier:NA

## 2020-12-25 NOTE — Progress Notes (Signed)
Inpatient Rehabilitation Care Coordinator Discharge Note   Patient Details  Name: Benjamin [name] MRN: [id number] Date of Birth: 02-08-1961   Discharge location: [facility]  Length of Stay: 10 DAYS  Discharge activity level: SUPERVISION LEVEL  Home/community participation: ACTIVE  Patient response OA:CZYSAY Literacy - How often do you need to have someone help you when you read instructions, pamphlets, or other written material from your doctor or pharmacy?: Sometimes  Patient response TK:ZSWFUX Isolation - How often do you feel lonely or isolated from those around you?: Rarely  Services provided included: MD, RD, PT, OT, RN, CM, Pharmacy, SW  Financial Services:  Financial Services Utilized: Medicaid    Choices offered to/list presented to: PT AND SISTER  Follow-up services arranged:  Other (Comment) (COULD NOT FIND A HOME HEALTH AGENCY TO TAKE HIS REFERRAL DUE TO MEDICAID) HAS ALL EQUIPMENT FROM PAST ADMISSIONS           Patient response to transportation need: Is the patient able to respond to transportation needs?: Yes In the past 12 months, has lack of transportation kept you from medical appointments or from getting medications?: No In the past 12 months, has lack of transportation kept you from meetings, work, or from getting things needed for daily living?: No    Comments (or additional information):PT CHANGED HIS MIND MULTIPLE TIMES AND NOW IS GOING TO GO TO [facility] SINCE IT IS A PRIVATE ROOM AND THEY WILL NOT TAKE HIS CHECK.   Patient/Family verbalized understanding of follow-up arrangements:  Yes  Individual responsible for coordination of the follow-up plan: [name]-SISTER [phone]  Confirmed correct DME delivered: Lucy Chris 12/25/2020    Lucy Chris

## 2020-12-25 NOTE — Progress Notes (Addendum)
Patient ID: Benjamin [name], male   DOB: [birth year], 60 y.o.   MRN: [id number] After much back and forth with pt and sister, pt has decided to go home and not to the NH. He does have insurance as of today. Aware have not been able to find any home health due to his medicaid. Sister reports he will need to do what he is suppose to do at home to take care of himself. Sister does not drive due to her MS. Will need transport home and has none of his equipment here so will need to go via ambulance. Pam-PA aware of change and is getting paperwork together for discharge home. HHRN-Denise feels pt should go to the NH, but pt still wants to go home. He doesn't want a roommate and he does not want to give up his check  11:00 AM Pt has changed his mind again and plans to go to [facility] now. They called him and told him he will be in a private room. Have informed RN need COVID test and Pam-PA along with sister aware of change.

## 2020-12-25 NOTE — Progress Notes (Signed)
Patient discharged and transported by [facility] to [facility]. Pick up at 2105

## 2020-12-25 NOTE — Progress Notes (Signed)
PROGRESS NOTE   Subjective/Complaints: D/c home today. I have discussed with sister over phone today. Unfortunately he will not qualify for home services but should qualify for outpatient services, and he has been practicing his dressing changes.   ROS:  Pt denies SOB, abd pain, CP, N/V/C/D, and vision changes, insomnia improved, +phantom limb pain  Objective:   No results found. No results for input(s): WBC, HGB, HCT, PLT in the last 72 hours.   Recent Labs    12/23/20 0542  NA 133*  K 4.1  CL 99  CO2 26  GLUCOSE 98  BUN 12  CREATININE 0.67  CALCIUM 8.8*     Intake/Output Summary (Last 24 hours) at 12/25/2020 1015 Last data filed at 12/25/2020 0757 Gross per 24 hour  Intake 820 ml  Output 1600 ml  Net -780 ml        Physical Exam: Vital Signs Blood pressure 97/76, pulse 90, temperature 97.7 F (36.5 C), temperature source Oral, resp. rate 17, height 5\' 7"  (1.702 m), weight 56.5 kg, SpO2 98 %. Gen: no distress, normal appearing HEENT: oral mucosa pink and moist, NCAT Cardio: Reg rate Chest: normal effort, normal rate of breathing Abd: soft, non-distended Ext: no edema Psych: pleasant, normal affect Musculoskeletal:     Cervical back: Normal range of motion. No rigidity.     Comments: Hight L-AKA with dry dressing.C/D/I MS: UE strength 5/5; RLE 5/5 LLE- didn't test due to high AKA except could lift against gravity- HF  Skin:    Comments: Pt didn't allow me to assess- since "just walked- is throbbing- asked for pain meds L groin incision assessed - healed by moist- no yeast seen   R foot- has DP pulse- trace; not PT pulse- is warm- no skin breakdown.  On RLE Neurological:     Mental Status: He is alert and oriented to person, place, and time.     Comments: Intact to light touch in UE and RLE-  Psychiatric:        Mood and Affect: Mood normal.        Behavior: Behavior normal.     Assessment/Plan: 1. Functional deficits which require 3+ hours per day of interdisciplinary therapy in a comprehensive inpatient rehab setting. Physiatrist is providing close team supervision and 24 hour management of active medical problems listed below. Physiatrist and rehab team continue to assess barriers to discharge/monitor patient progress toward functional and medical goals  Care Tool:  Bathing    Body parts bathed by patient: Right arm, Left arm, Chest, Abdomen, Front perineal area, Buttocks, Right upper leg, Face, Left upper leg, Right lower leg   Body parts bathed by helper: Right lower leg Body parts n/a: Left upper leg   Bathing assist Assist Level: Supervision/Verbal cueing     Upper Body Dressing/Undressing Upper body dressing   What is the patient wearing?: Pull over shirt    Upper body assist Assist Level: Supervision/Verbal cueing    Lower Body Dressing/Undressing Lower body dressing      What is the patient wearing?: Underwear/pull up, Pants     Lower body assist Assist for lower body dressing: Supervision/Verbal cueing     Toileting  Toileting    Toileting assist Assist for toileting: Supervision/Verbal cueing Assistive Device Comment: BSC   Transfers Chair/bed transfer  Transfers assist     Chair/bed transfer assist level: Independent with assistive device Chair/bed transfer assistive device: Geologist, engineering   Ambulation assist      Assist level: Independent with assistive device Assistive device: Walker-rolling Max distance: 43ft   Walk 10 feet activity   Assist     Assist level: Independent with assistive device Assistive device: Walker-rolling   Walk 50 feet activity   Assist Walk 50 feet with 2 turns activity did not occur: Safety/medical concerns (fatigue, pain, decreased balance, weakness/deconditioning)  Assist level: Independent with assistive device Assistive device: Walker-rolling    Walk  150 feet activity   Assist Walk 150 feet activity did not occur: Safety/medical concerns         Walk 10 feet on uneven surface  activity   Assist Walk 10 feet on uneven surfaces activity did not occur: Safety/medical concerns (fatigue, pain, decreased balance, weakness/deconditioning)   Assist level: Supervision/Verbal cueing Assistive device: Walker-rolling   Wheelchair     Assist Is the patient using a wheelchair?: Yes Type of Wheelchair: Manual    Wheelchair assist level: Independent, Set up assist Max wheelchair distance: 188ft    Wheelchair 50 feet with 2 turns activity    Assist        Assist Level: Independent   Wheelchair 150 feet activity     Assist      Assist Level: Independent   Blood pressure 97/76, pulse 90, temperature 97.7 F (36.5 C), temperature source Oral, resp. rate 17, height 5\' 7"  (1.702 m), weight 56.5 kg, SpO2 98 %.  Medical Problem List and Plan: 1.  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  PAD/Antithrombotics: d/c lovenox             -antiplatelet therapy: Plavix 3. Phantom limb pain: Increase gabapentin to 400mg  BID. Oxycodone prn for severe pain and ultram prn for moderate pain 4. Mood: LCSW to follow for evaluation and support.              -antipsychotic agents: N/A 5. Neuropsych: This patient is capable of making decisions on his own behalf. 6. Skin/Wound Care: Monitor wound for healing.   --On Zinc, vitamin C, boost and prosource to promote healing.  7. Fluids/Electrolytes/Nutrition: Monitor I/O.  8.  GERD: Continue Protonix 9. Acute blood loss anemia: Recheck CBC in am.  10 Tobacco abuse: Continue to encourage tobacco cessation.              --continue nicotine patch to prevent withdrawal.  11. Hyponatremia: stable, monitor as needed  12. Prediabetes: Hgb A1C- 6.0. Add CM  restrictions to diet as fasting BS 107-150 range. No need for additional CBG monitoring 13. Elevated A phos: Likely due to infection. Recheck LFTs in am.  14. HTN: Cozaar remains on hold.  --Continue Coreg bid   Add clonidine patch for HTN and pain- much better control of both- continue 15. Low protein levels: encouraged high protein diet.  16. Hypokalemia: supplement with klor today 17. Disposition: HFU with me in December   >30 minutes spent in discharge of patient including review of medications  and follow-up appointments, physical examination, and in answering all patient's questions    LOS: 10 days A FACE TO FACE EVALUATION WAS PERFORMED  Jahlia Omura P Aiden Helzer 12/25/2020, 10:15 AM

## 2020-12-25 NOTE — Progress Notes (Signed)
Notified [facility] regarding time for transfer, was informed they ere in route for pickup, assigned nurse was to be Rella Larve) informed by NT

## 2020-12-25 NOTE — Discharge Summary (Addendum)
Physician Discharge Summary  Patient ID: Benjamin [name] MRN: [id number] DOB/AGE: Dec 30, 1960 60 y.o.  Admit date: 12/15/2020 Discharge date: 12/25/2020  Discharge Diagnoses:  Principal Problem:   Unilateral AKA, left (HCC) Active Problems:   High blood pressure   PAD (peripheral artery disease) (HCC)   Acute blood loss anemia   Hyponatremia   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HCC)   Discharged Condition: good  Significant Diagnostic Studies: N/a   Labs:  Basic Metabolic Panel: BMP Latest Ref Rng & Units 12/23/2020 12/21/2020 12/16/2020  Glucose 70 - 99 mg/dL 98 95 469(G)  BUN 8 - 23 mg/dL 12 13 12   Creatinine 0.61 - 1.24 mg/dL 2.95 2.84  BUN/Creat Ratio 10 - 24 - - -  Sodium 135 - 145 mmol/L 133(L) 136 133(L)  Potassium 3.5 - 5.1 mmol/L 4.1 3.3(L) 4.4  Chloride 98 - 111 mmol/L 99 102 98  CO2 22 - 32 mmol/L 26 26 25   Calcium 8.9 - 10.3 mg/dL 1.32) ) 9.0     CBC: CBC Latest Ref Rng & Units 12/25/2020 12/21/2020 12/16/2020  WBC 4.0 - 10.5 K/uL 5.5 6.7 6.8  Hemoglobin 13.0 - 17.0 g/dL 9.0(L) 7.5(L) 7.9(L)  Hematocrit 39.0 - 52.0 % 28.6(L) 23.2(L) 24.3(L)  Platelets 150 - 400 K/uL 692(H) 644(H) 727(H)     CBG: No results for input(s): GLUCAP in the last 168 hours.  Brief HPI:   Benjamin [name] is a 60 y.o. male with history of PAD s/p multiple BLE bypass grafts, ongoing heavy tobacco abuse, left-AKA with removal of infected femoropopliteal graft 08/14 but continued to have wound dehiscence with bone exposure, drainage and cellulitis.  Family had to encourage him to seek help and he was admitted on 12/07/2020 for IV antibiotics and cultures of wound grew out Enterococcus, Enterobacter and Klebsiella.  He underwent wound revision of left AKA on 10/18 by Dr. 12/09/2020 and.   Post op tachycardia was managed with addition of low-dose Coreg.  He completed his course of antibiotics with resolution of leukocytosis and ABLA noted to be stable around 7 range. His  sodium levels were noted to be labile.  Therapy was initiated and patient continued to be limited by pain, weakness as well as fatigue.  CIR was recommended due to functional decline   Hospital Course: Benjamin [name] was admitted to rehab 12/15/2020 for inpatient therapies to consist of PT  and OT at least three hours five days a week. Past admission physiatrist, therapy team and rehab RN have worked together to provide customized collaborative inpatient rehab.  Zinc and vitamin C was added to help with wound healing.  Carb modified restrictions were added due to history of prediabetes with A1c at 6.0.  P.o. intake has been good and his fasting blood sugars have been within normal limits. His blood pressures were monitored on TID basis and have been well controlled on Coreg alone follow-up CBC showed acute blood loss anemia is improving and reactive thrombocytosis is slowly resolving.  Serial check of electrolytes showed mild hypokalemia which has resolved with brief supplementation.    He was noted to have recurrent hyponatremia which is stable and recommend repeat labs in 1 to 2 weeks to monitor for trends.  He reported issues with phantom pain which is worse at nights therefore was started on low-dose gabapentin which has been titrated upwards for better pain control.  Muscle relaxers were also used to help with muscle spasms and he has been educated on desensitization techniques.  He continues to require oxycodone as needed for breakthrough pain and this is slowly being weaned down.  He has made good gains during his rehab stay and intermittent supervision is recommended for safety.  Patient and family have elected on SNF for progressive therapies.  He was discharged to [facility] on 12/25/2020.   Rehab course: During patient's stay in rehab weekly team conferences were held to monitor patient's progress, set goals and discuss barriers to discharge. At admission, patient required he required min to mod  assist with basic ADL tasks and min assist with mobility. He  has had improvement in activity tolerance, balance, postural control as well as ability to compensate for deficits.  He requires set up assist for ADL tasks.He is independent with assistive device for transfers and is able to ambulate 80 feet with rolling walker.  He requires supervision to navigate a ramp.    Disposition: Skilled Nursing Facility  Diet: Cab Modified.    Special Instructions: Wash incision with soap and water. Keep clean and dry and wear compressive stocking. No smoking.  Recommend repeat BMET/CBC in 5-7 days to monitor Sodium as well as H/H.    Allergies as of 12/25/2020       Reactions   Aspirin Nausea And Vomiting   Garlic Other (See Comments)   sneezing        Medication List     STOP taking these medications    bisacodyl 5 MG EC tablet Commonly known as: DULCOLAX   Ibuprofen PM 200-25 MG Caps Generic drug: Ibuprofen-diphenhydrAMINE HCl   losartan 25 MG tablet Commonly known as: COZAAR   phenylephrine 10 MG Tabs tablet Commonly known as: SUDAFED PE   Vitamin D (Ergocalciferol) 1.25 MG (50000 UNIT) Caps capsule Commonly known as: DRISDOL       TAKE these medications    acetaminophen 325 MG tablet Commonly known as: TYLENOL Take 1-2 tablets (325-650 mg total) by mouth every 4 (four) hours as needed for mild pain. What changed:  medication strength how much to take when to take this reasons to take this   ascorbic acid 500 MG tablet Commonly known as: VITAMIN C Take 1 tablet (500 mg total) by mouth daily. Start taking on: December 26, 2020   atorvastatin 20 MG tablet Commonly known as: Lipitor Take 1 tablet (20 mg total) by mouth daily.   carvedilol 6.25 MG tablet Commonly known as: COREG Take 1 tablet (6.25 mg total) by mouth 2 (two) times daily with a meal.   cloNIDine 0.1 mg/24hr patch Commonly known as: CATAPRES - Dosed in mg/24 hr Place 1 patch (0.1 mg total) onto  the skin once a week. On tuesdays Start taking on: December 29, 2020   clopidogrel 75 MG tablet Commonly known as: PLAVIX Take 1 tablet (75 mg total) by mouth daily.   docusate sodium 100 MG capsule Commonly known as: COLACE Take 1 capsule (100 mg total) by mouth daily.   gabapentin 300 MG capsule Commonly known as: NEURONTIN Take 1 capsule (300 mg total) by mouth 2 (two) times daily. What changed:  medication strength how much to take how to take this when to take this additional instructions   gabapentin 400 MG capsule Commonly known as: NEURONTIN Take 1 capsule (400 mg total) by mouth 2 (two) times daily. What changed: You were already taking a medication with the same name, and this prescription was added. Make sure you understand how and when to take each.   guaiFENesin-dextromethorphan 100-10 MG/5ML syrup  Commonly known as: ROBITUSSIN DM Take 15 mLs by mouth every 4 (four) hours as needed for cough.   hydrOXYzine 25 MG tablet Commonly known as: ATARAX/VISTARIL Take 1 tablet (25 mg total) by mouth at bedtime as needed for anxiety.   lactose free nutrition Liqd Take 237 mLs by mouth 2 (two) times daily between meals.   methocarbamol 500 MG tablet Commonly known as: ROBAXIN Take 1 tablet (500 mg total) by mouth every 6 (six) hours as needed for muscle spasms.   multivitamin with minerals Tabs tablet Take 1 tablet by mouth daily.   nicotine 21 mg/24hr patch Commonly known as: NICODERM CQ - dosed in mg/24 hours Place 1 patch (21 mg total) onto the skin daily. Start taking on: December 26, 2020   oxyCODONE-acetaminophen 5-325 MG tablet Commonly known as: PERCOCET/ROXICET Take 1-2 tablets by mouth every 8 (eight) hours as needed for severe pain. What changed:  when to take this reasons to take this   pantoprazole 40 MG tablet Commonly known as: PROTONIX Take 1 tablet (40 mg total) by mouth daily.   senna 8.6 MG Tabs tablet Commonly known as: SENOKOT Take 1  tablet (8.6 mg total) by mouth 2 (two) times daily. What changed:  medication strength how much to take when to take this reasons to take this   traMADol 50 MG tablet Commonly known as: Ultram Take 1 tablet (50 mg total) by mouth every 12 (twelve) hours as needed for moderate pain.   traZODone 50 MG tablet Commonly known as: DESYREL Take 0.5-1 tablets (25-50 mg total) by mouth at bedtime as needed for sleep.   Zinc Sulfate 220 (50 Zn) MG Tabs Take 1 tablet (220 mg total) by mouth daily.        Contact information for follow-up providers     Raulkar, Clide Deutscher, MD Follow up.   Specialty: Physical Medicine and Rehabilitation Why: Please arrive on 01/26/21 at 1:20pm for 1:40pm appointment, thank you! Contact information: [id number] [place] [address] [phone]         Angelia Mould, MD. Call on 01/21/2021.   Specialties: Vascular Surgery, Cardiology Why: be there at 2:40 pm for follow up appointment Contact information: [place] [address] [phone]              Contact information for after-discharge care     [facility] .   Service: Skilled Nursing Contact information: [address]                     Signed: Bary Leriche 12/25/2020, 4:19 PM

## 2020-12-29 ENCOUNTER — Telehealth: Payer: Self-pay | Admitting: Licensed Clinical Social Worker

## 2020-12-29 NOTE — Telephone Encounter (Signed)
Received a call from pt sister inquiring if I had any updates on pt from rehab- shared that I do not have access to SNF charting system, recommended she call SNF directly. She is aware of f/u visit at beginning of December. Had several additional questions about how to manage pt car payments w/ checks while he is at SNF since he didn't open a bank account and had been cashing them out- recommended she speak with pt and her bank to see how to best manage this. She will reach out if any additional questions/concerns.   [name], MSW, LCSW [facility] Heart/Vascular Care Navigation  [phone]

## 2020-12-30 ENCOUNTER — Telehealth: Payer: Self-pay

## 2020-12-30 NOTE — Telephone Encounter (Signed)
Spoke with patients sister to complete TCM call. Sister reported Benjamin [name] went to a nursing home for rehab after discharge. Provider notified for further steps.

## 2020-12-30 NOTE — Telephone Encounter (Signed)
Error

## 2021-01-04 ENCOUNTER — Other Ambulatory Visit: Payer: Self-pay

## 2021-01-07 ENCOUNTER — Encounter (HOSPITAL_BASED_OUTPATIENT_CLINIC_OR_DEPARTMENT_OTHER): Payer: Medicaid Other | Admitting: Internal Medicine

## 2021-01-11 ENCOUNTER — Encounter (HOSPITAL_BASED_OUTPATIENT_CLINIC_OR_DEPARTMENT_OTHER): Payer: Medicaid Other | Attending: Internal Medicine | Admitting: Internal Medicine

## 2021-01-11 ENCOUNTER — Other Ambulatory Visit: Payer: Self-pay

## 2021-01-11 DIAGNOSIS — I739 Peripheral vascular disease, unspecified: Secondary | ICD-10-CM | POA: Diagnosis not present

## 2021-01-11 DIAGNOSIS — L97821 Non-pressure chronic ulcer of other part of left lower leg limited to breakdown of skin: Secondary | ICD-10-CM | POA: Insufficient documentation

## 2021-01-11 DIAGNOSIS — Z87891 Personal history of nicotine dependence: Secondary | ICD-10-CM | POA: Diagnosis not present

## 2021-01-11 DIAGNOSIS — Z89612 Acquired absence of left leg above knee: Secondary | ICD-10-CM

## 2021-01-11 NOTE — Progress Notes (Signed)
IMANUEL, HIRSCHFELD ([id number]) Visit Report for 01/11/2021 Chief Complaint Document Details Patient Name: Date of Service: [place], [place] V ID L. 01/11/2021 2:45 PM Medical Record Number: [id number] Patient Account Number: [id number] Date of Birth/Sex: Treating RN: 1961/01/26 (60 y.o. Marcheta Grammes Primary Care Provider: [name] Other Clinician: Referring Provider: Treating Provider/Extender: Angie Fava Weeks in Treatment: 0 Information Obtained from: Patient Chief Complaint 01/11/2021: Wound on incision site of recent left AKA revision Electronic Signature(s) Signed: 01/11/2021 4:40:45 PM By: Kalman Shan DO Entered By: Kalman Shan on 01/11/2021 16:28:08 -------------------------------------------------------------------------------- HPI Details Patient Name: Date of Service: [place], [place] L. 01/11/2021 2:45 PM Medical Record Number: [id number] Patient Account Number: [id number] Date of Birth/Sex: Treating RN: 1960/11/18 (60 y.o. Marcheta Grammes Primary Care Provider: [name] Other Clinician: Referring Provider: Treating Provider/Extender: Angie Fava Weeks in Treatment: 0 History of Present Illness HPI Description: Admission 01/11/2021 Mr. 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 He states he noticed this 1 week ago and has been putting gauze on the area. He denies signs of infection. He states the wound site appears healing to him. Electronic Signature(s) Signed: 01/11/2021 4:40:45 PM By: Kalman Shan DO Entered By: Kalman Shan on 01/11/2021  16:37:15 -------------------------------------------------------------------------------- Physical Exam Details Patient Name: Date of Service: [place], [place] V [place] L. 01/11/2021 2:45 PM Medical Record Number: [id number] Patient Account Number: [id number] Date of Birth/Sex: Treating RN: 04-21-1960 (60 y.o. Marcheta Grammes Primary Care Provider: [name] Other Clinician: Referring Provider: Treating Provider/Extender: Angie Fava Weeks in Treatment: 0 Constitutional respirations regular, non-labored and within target range for patient.Marland Kitchen Psychiatric pleasant and cooperative. Notes Left AKA incision site with staples in place. There is an open wound along the incision site limited to skin breakdown. No obvious signs of infection. Electronic Signature(s) Signed: 01/11/2021 4:40:45 PM By: Kalman Shan DO Entered By: Kalman Shan on 01/11/2021 16:38:01 -------------------------------------------------------------------------------- Physician Orders Details Patient Name: Date of Service: [place], [place] V [place] L. 01/11/2021 2:45 PM Medical Record Number: [id number] Patient Account Number: [id number] Date of Birth/Sex: Treating RN: [birth year] (60 y.o. Ernestene Mention Primary Care Provider: [name] Other Clinician: Referring Provider: Treating Provider/Extender: Angie Fava Weeks in Treatment: 0 Verbal / Phone Orders: No Diagnosis Coding Follow-up Appointments Return Appointment in 2 weeks. Bathing/ Shower/ Hygiene May shower with protection but do not get wound dressing(s) wet. Wound Treatment Wound #2 - Amputation Site - Above Knee Wound Laterality: Left Prim Dressing: KerraCel Ag Gelling Fiber Dressing, 2x2 in (silver alginate) Every Other Day/15 Days ary Discharge Instructions: Apply silver alginate to wound bed as instructed Secondary Dressing: ABD Pad, 8x10 Every Other Day/15 Days Discharge Instructions: Apply over primary  dressing as directed. Secured With: 48M Medipore H Soft Cloth Surgical T ape, 4 x 10 (in/yd) Every Other Day/15 Days Discharge Instructions: Secure with tape as directed. Electronic Signature(s) Signed: 01/11/2021 4:40:45 PM By: Kalman Shan DO Previous Signature: 01/11/2021 4:18:59 PM Version By: Baruch Gouty RN, BSN Entered By: Kalman Shan on 01/11/2021 16:38:18 -------------------------------------------------------------------------------- Problem List Details Patient Name: Date of Service: [place], [place] V [place] L. 01/11/2021 2:45 PM Medical Record Number:  [id number] Patient Account Number: [id number] Date of Birth/Sex: Treating RN: 11-17-1960 (60 y.o. Marcheta Grammes Primary Care Provider: [name] Other Clinician: Referring Provider: Treating Provider/Extender: Angie Fava Weeks in Treatment: 0 Active Problems ICD-10 Encounter Code Description Active Date MDM Diagnosis [phone] Acquired absence of left leg above knee 01/11/2021 No Yes L97.821 Non-pressure chronic ulcer of other part of left lower leg limited to breakdown 01/11/2021 No Yes of skin I73.9 Peripheral vascular disease, unspecified 01/11/2021 No Yes Z87.891 Personal history of nicotine dependence 01/11/2021 No Yes Inactive Problems Resolved Problems Electronic Signature(s) Signed: 01/11/2021 4:40:45 PM By: Kalman Shan DO Entered By: Kalman Shan on 01/11/2021 16:27:39 -------------------------------------------------------------------------------- Progress Note Details Patient Name: Date of Service: [place], [place] V [place] L. 01/11/2021 2:45 PM Medical Record Number: [id number] Patient Account Number: [id number] Date of Birth/Sex: Treating RN: [birth year] (60 y.o. Marcheta Grammes Primary Care Provider: [name] Other Clinician: Referring Provider: Treating Provider/Extender: Angie Fava Weeks in Treatment: 0 Subjective Chief Complaint Information  obtained from Patient 01/11/2021: Wound on incision site of recent left AKA revision History of Present Illness (HPI) Admission 01/11/2021 Mr. 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 He states he noticed this 1 week ago and has been putting gauze on the area. He denies signs of infection. He states the wound site appears healing to him. Patient History Information obtained from Patient. Allergies aspirin, garlic Family History No family history of Cancer, Diabetes, Heart Disease, Hereditary Spherocytosis, Hypertension, Kidney Disease, Lung Disease, Seizures, Stroke, Thyroid Problems, Tuberculosis. Social History Former smoker - quit 1 month ago, Marital Status - Single, Alcohol Use - Never, Drug Use - No History, Caffeine Use - Daily. Medical History Eyes Denies history of Cataracts, Glaucoma, Optic Neuritis Ear/Nose/Mouth/Throat Denies history of Chronic sinus problems/congestion, Middle ear problems Hematologic/Lymphatic Patient has history of Anemia Denies history of Hemophilia, Human Immunodeficiency Virus, Lymphedema, Sickle Cell Disease Respiratory Denies history of Aspiration, Asthma, Chronic Obstructive Pulmonary Disease (COPD), Pneumothorax, Sleep Apnea, Tuberculosis Cardiovascular Patient has history of Hypertension, Peripheral Arterial Disease Denies history of Angina, Arrhythmia, Congestive Heart Failure, Coronary Artery Disease, Deep Vein Thrombosis, Hypotension, Myocardial Infarction, Peripheral Venous Disease, Phlebitis, Vasculitis Gastrointestinal Denies history of Cirrhosis , Colitis, Crohnoos, Hepatitis A, Hepatitis B, Hepatitis C Endocrine Denies history of  Type I Diabetes, Type II Diabetes Genitourinary Denies history of End Stage Renal Disease Immunological Denies history of Lupus Erythematosus, Raynaudoos, Scleroderma Integumentary (Skin) Denies history of History of Burn Musculoskeletal Denies history of Gout, Rheumatoid Arthritis, Osteoarthritis, Osteomyelitis Neurologic Denies history of Dementia, Neuropathy, Quadriplegia, Paraplegia, Seizure Disorder Oncologic Denies history of Received Chemotherapy, Received Radiation Psychiatric Denies history of Anorexia/bulimia, Confinement Anxiety Hospitalization/Surgery History - left AKA. - revision left AKA. Review of Systems (ROS) Constitutional Symptoms (General Health) Denies complaints or symptoms of Fatigue, Fever, Chills, Marked Weight Change. Eyes Denies complaints or symptoms of Dry Eyes, Vision Changes, Glasses / Contacts. Ear/Nose/Mouth/Throat Denies complaints or symptoms of Chronic sinus problems or rhinitis. Respiratory Denies complaints or symptoms of Chronic or frequent coughs, Shortness of Breath. Cardiovascular Denies complaints or symptoms of Chest pain. Gastrointestinal Denies complaints or symptoms of Frequent diarrhea, Nausea, Vomiting. Endocrine Denies complaints or symptoms of Heat/cold intolerance. Genitourinary Denies complaints  or symptoms of Frequent urination. Integumentary (Skin) Complains or has symptoms of Wounds - left AKA. Musculoskeletal Denies complaints or symptoms of Muscle Pain, Muscle Weakness. Neurologic Denies complaints or symptoms of Numbness/parasthesias. Psychiatric Denies complaints or symptoms of Claustrophobia, Suicidal. Objective Constitutional respirations regular, non-labored and within target range for patient.. Vitals Time Taken: 3:15 PM, Height: 66 in, Source: Stated, Weight: 144 lbs, Source: Stated, BMI: 23.2, Temperature: 97.8 F, Pulse: 113 bpm, Respiratory Rate: 18 breaths/min, Blood Pressure: 166/100  mmHg. Psychiatric pleasant and cooperative. General Notes: Left AKA incision site with staples in place. There is an open wound along the incision site limited to skin breakdown. No obvious signs of infection. Integumentary (Hair, Skin) Wound #2 status is Open. Original cause of wound was Surgical Injury. The date acquired was: 12/07/2020. The wound is located on the Left Amputation Site - Above Knee. The wound measures 0.4cm length x 1cm width x 0.1cm depth; 0.314cm^2 area and 0.031cm^3 volume. There is Fat Layer (Subcutaneous Tissue) exposed. There is no tunneling or undermining noted. There is a small amount of serosanguineous drainage noted. The wound margin is flat and intact. There is large (67-100%) red, pink granulation within the wound bed. There is no necrotic tissue within the wound bed. 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 There are no signs of infection on exam. It appears that one of the staples is aggravating the wound. He states he is seeing Dr. Trula Slade December 1 for evaluation of the site and possible staple removal. For now I recommended he use silver alginate every other day to the wound. Plan Follow-up Appointments: Return Appointment in 2 weeks. Bathing/ Shower/ Hygiene: May shower with protection but do not get wound dressing(s) wet. WOUND #2: - Amputation Site - Above Knee Wound Laterality: Left Prim Dressing: KerraCel Ag Gelling Fiber Dressing, 2x2 in (silver alginate) Every Other Day/15 Days ary Discharge Instructions: Apply silver alginate to wound bed as instructed Secondary Dressing: ABD Pad, 8x10 Every Other Day/15 Days Discharge Instructions:  Apply over primary dressing as directed. Secured With: 27M Medipore H Soft Cloth Surgical T ape, 4 x 10 (in/yd) Every Other Day/15 Days Discharge Instructions: Secure with tape as directed. 1. Silver alginate 2. Follow-up in 2 weeks Electronic Signature(s) Signed: 01/11/2021 4:40:45 PM By: Kalman Shan DO Entered By: Kalman Shan on 01/11/2021 16:39:57 -------------------------------------------------------------------------------- HxROS Details Patient Name: Date of Service: [place], [place] V [place] L. 01/11/2021 2:45 PM Medical Record Number: [id number] Patient Account Number: [id number] Date of Birth/Sex: Treating RN: Dec 13, 1960 (60 y.o. Ernestene Mention Primary Care Provider: [name] Other Clinician: Referring Provider: Treating Provider/Extender: Angie Fava Weeks in Treatment: 0 Information Obtained From Patient Constitutional Symptoms (General Health) Complaints and Symptoms: Negative for: Fatigue; Fever; Chills; Marked Weight Change Eyes Complaints and Symptoms: Negative for: Dry Eyes; Vision Changes; Glasses / Contacts Medical History: Negative for: Cataracts; Glaucoma; Optic Neuritis Ear/Nose/Mouth/Throat Complaints and Symptoms: Negative for: Chronic sinus problems or rhinitis Medical History: Negative for: Chronic sinus problems/congestion; Middle ear problems Respiratory Complaints and Symptoms: Negative for: Chronic or frequent coughs; Shortness of Breath Medical History: Negative for: Aspiration; Asthma; Chronic Obstructive Pulmonary Disease (COPD); Pneumothorax; Sleep Apnea; Tuberculosis Cardiovascular Complaints and Symptoms: Negative for: Chest  pain Medical History: Positive for: Hypertension; Peripheral Arterial Disease Negative for: Angina; Arrhythmia; Congestive Heart Failure; Coronary Artery Disease; Deep Vein Thrombosis; Hypotension; Myocardial Infarction; Peripheral Venous Disease; Phlebitis;  Vasculitis Gastrointestinal Complaints and Symptoms: Negative for: Frequent diarrhea; Nausea; Vomiting Medical History: Negative for: Cirrhosis ; Colitis; Crohns; Hepatitis A; Hepatitis B; Hepatitis C Endocrine Complaints and Symptoms: Negative for: Heat/cold intolerance Medical History: Negative for: Type I Diabetes; Type II Diabetes Genitourinary Complaints and Symptoms: Negative for: Frequent urination Medical History: Negative for: End Stage Renal Disease Integumentary (Skin) Complaints and Symptoms: Positive for: Wounds - left AKA Medical History: Negative for: History of Burn Musculoskeletal Complaints and Symptoms: Negative for: Muscle Pain; Muscle Weakness Medical History: Negative for: Gout; Rheumatoid Arthritis; Osteoarthritis; Osteomyelitis Neurologic Complaints and Symptoms: Negative for: Numbness/parasthesias Medical History: Negative for: Dementia; Neuropathy; Quadriplegia; Paraplegia; Seizure Disorder Psychiatric Complaints and Symptoms: Negative for: Claustrophobia; Suicidal Medical History: Negative for: Anorexia/bulimia; Confinement Anxiety Hematologic/Lymphatic Medical History: Positive for: Anemia Negative for: Hemophilia; Human Immunodeficiency Virus; Lymphedema; Sickle Cell Disease Immunological Medical History: Negative for: Lupus Erythematosus; Raynauds; Scleroderma Oncologic Medical History: Negative for: Received Chemotherapy; Received Radiation Immunizations Pneumococcal Vaccine: Received Pneumococcal Vaccination: Yes Received Pneumococcal Vaccination On or After 60th Birthday: Yes Implantable Devices None Hospitalization / Surgery History Type of Hospitalization/Surgery left AKA revision left AKA Family and Social History Cancer: No; Diabetes: No; Heart Disease: No; Hereditary Spherocytosis: No; Hypertension: No; Kidney Disease: No; Lung Disease: No; Seizures: No; Stroke: No; Thyroid Problems: No; Tuberculosis: No; Former smoker -  quit 1 month ago; Marital Status - Single; Alcohol Use: Never; Drug Use: No History; Caffeine Use: Daily; Financial Concerns: No; Food, Clothing or Shelter Needs: No; Support System Lacking: No; Transportation Concerns: No Electronic Signature(s) Signed: 01/11/2021 4:18:59 PM By: Zenaida Deed RN, BSN Signed: 01/11/2021 4:40:45 PM By: Geralyn Corwin DO Entered By: Zenaida Deed on 01/11/2021 15:24:08 -------------------------------------------------------------------------------- SuperBill Details Patient Name: Date of Service: [place], [place] V ID L. 01/11/2021 Medical Record Number: [id number] Patient Account Number: [id number] Date of Birth/Sex: Treating RN: 10-05-1960 (60 y.o. Damaris Schooner Primary Care Provider: Arnette Felts Other Clinician: Referring Provider: Treating Provider/Extender: Tempie Donning Weeks in Treatment: 0 Diagnosis Coding ICD-10 Codes Code Description [id number] Acquired absence of left leg above knee L97.821 Non-pressure chronic ulcer of other part of left lower leg limited to breakdown of skin I73.9 Peripheral vascular disease, unspecified Z87.891 Personal history of nicotine dependence Facility Procedures CPT4 Code: [id number] Description: 99214 - WOUND CARE VISIT-LEV 4 EST PT Modifier: Quantity: 1 Physician Procedures : CPT4 Code Description Modifier [id number] 99213 - WC PHYS LEVEL 3 - EST PT 1 ICD-10 Diagnosis Description Z89.612 Acquired absence of left leg above knee L97.821 Non-pressure chronic ulcer of other part of left lower leg limited to breakdown of skin I73.9  Peripheral vascular disease, unspecified Z87.891 Personal history of nicotine dependence Quantity: Electronic Signature(s) Signed: 01/11/2021 4:40:45 PM By: Geralyn Corwin DO Previous Signature: 01/11/2021 4:18:59 PM Version By: Zenaida Deed RN, BSN Entered By: Geralyn Corwin on 01/11/2021 16:40:22

## 2021-01-11 NOTE — Progress Notes (Signed)
Benjamin [name], Benjamin [name] ([id number]) Visit Report for 01/11/2021 Abuse/Suicide Risk Screen Details Patient Name: Date of Service: [place], [place] V ID L. 01/11/2021 2:45 PM Medical Record Number: [id number] Patient Account Number: [id number] Date of Birth/Sex: Treating RN: November 22, 1960 (60 y.o. Benjamin [name] Primary Care Hawkin Charo: Arnette Felts Other Clinician: Referring Makaylia Hewett: Treating Austine Kelsay/Extender: Tempie Donning Weeks in Treatment: 0 Abuse/Suicide Risk Screen Items Answer ABUSE RISK SCREEN: Has anyone close to you tried to hurt or harm you recentlyo No Do you feel uncomfortable with anyone in your familyo No Has anyone forced you do things that you didnt want to doo No Electronic Signature(s) Signed: 01/11/2021 4:18:59 PM By: Zenaida Deed RN, BSN Entered By: Zenaida Deed on 01/11/2021 15:24:16 -------------------------------------------------------------------------------- Activities of Daily Living Details Patient Name: Date of Service: [place], [place] V [place] L. 01/11/2021 2:45 PM Medical Record Number: [id number] Patient Account Number: [id number] Date of Birth/Sex: Treating RN: Feb 09, 1961 (60 y.o. Benjamin [name] Primary Care Vir Whetstine: Arnette Felts Other Clinician: Referring Shanan Mcmiller: Treating Johnatha Zeidman/Extender: Tempie Donning Weeks in Treatment: 0 Activities of Daily Living Items Answer Activities of Daily Living (Please select one for each item) Drive Automobile Not Able T Medications ake Need Assistance Use T elephone Completely Able Care for Appearance Completely Able Use T oilet Completely Able Bath / Shower Completely Able Dress Self Completely Able Feed Self Completely Able Walk Need Assistance Get In / Out Bed Completely Able Housework Need Assistance Prepare Meals Need Assistance Handle Money Completely Able Shop for Self Need Assistance Electronic Signature(s) Signed: 01/11/2021 4:18:59 PM By: Zenaida Deed RN,  BSN Entered By: Zenaida Deed on 01/11/2021 15:24:57 -------------------------------------------------------------------------------- Education Screening Details Patient Name: Date of Service: CO Benjamin [name], [place] V ID L. 01/11/2021 2:45 PM Medical Record Number: [id number] Patient Account Number: [id number] Date of Birth/Sex: Treating RN: 1960-09-23 (60 y.o. Benjamin [name] Primary Care Veverly Larimer: Arnette Felts Other Clinician: Referring Keara Pagliarulo: Treating Arianah Torgeson/Extender: Maryruth Bun in Treatment: 0 Primary Learner Assessed: Patient Learning Preferences/Education Level/Primary Language Learning Preference: Explanation, Demonstration, Printed Material Highest Education Level: High School Preferred Language: English Cognitive Barrier Language Barrier: No Translator Needed: No Memory Deficit: No Emotional Barrier: No Cultural/Religious Beliefs Affecting Medical Care: No Physical Barrier Impaired Vision: No Impaired Hearing: No Decreased Hand dexterity: No Knowledge/Comprehension Knowledge Level: High Comprehension Level: High Ability to understand written instructions: High Ability to understand verbal instructions: High Motivation Anxiety Level: Calm Cooperation: Cooperative Education Importance: Acknowledges Need Interest in Health Problems: Asks Questions Perception: Coherent Willingness to Engage in Self-Management High Activities: Readiness to Engage in Self-Management High Activities: Electronic Signature(s) Signed: 01/11/2021 4:18:59 PM By: Zenaida Deed RN, BSN Entered By: Zenaida Deed on 01/11/2021 15:25:32 -------------------------------------------------------------------------------- Fall Risk Assessment Details Patient Name: Date of Service: [place], [place] V [place] L. 01/11/2021 2:45 PM Medical Record Number: [id number] Patient Account Number: [id number] Date of Birth/Sex: Treating RN: 1960/10/27 (60 y.o. Benjamin [name] Primary  Care Jiyah Torpey: Arnette Felts Other Clinician: Referring Mykaila Blunck: Treating Arlo Buffone/Extender: Tempie Donning Weeks in Treatment: 0 Fall Risk Assessment Items Have you had 2 or more falls in the last 12 monthso 0 Yes Have you had any fall that resulted in injury in the last 12 monthso 0 No FALLS RISK SCREEN History of falling - immediate or within 3 months 25 Yes Secondary diagnosis (Do you have 2 or more medical diagnoseso) 0 No Ambulatory aid None/bed rest/wheelchair/nurse 0 No Crutches/cane/walker 15 Yes Furniture 0 No Intravenous therapy Access/Saline/Heparin Lock 0  No Gait/Transferring Normal/ bed rest/ wheelchair 0 Yes Weak (short steps with or without shuffle, stooped but able to lift head while walking, may seek 0 No support from furniture) Impaired (short steps with shuffle, may have difficulty arising from chair, head down, impaired 0 No balance) Mental Status Oriented to own ability 0 Yes Electronic Signature(s) Signed: 01/11/2021 4:18:59 PM By: Zenaida Deed RN, BSN Entered By: Zenaida Deed on 01/11/2021 15:26:00 -------------------------------------------------------------------------------- Foot Assessment Details Patient Name: Date of Service: CO Benjamin [name], [place] V ID L. 01/11/2021 2:45 PM Medical Record Number: [id number] Patient Account Number: [id number] Date of Birth/Sex: Treating RN: 05-29-1960 (60 y.o. Benjamin [name] Primary Care Roel Douthat: Arnette Felts Other Clinician: Referring Shaunak Kreis: Treating Shanice Poznanski/Extender: Tempie Donning Weeks in Treatment: 0 Foot Assessment Items [x]  Unable to perform left foot assessment due to amputation Site Locations + = Sensation present, - = Sensation absent, C = Callus, U = Ulcer R = Redness, W = Warmth, M = Maceration, PU = Pre-ulcerative lesion F = Fissure, S = Swelling, D = Dryness Assessment Right: Left: Other Deformity: No Prior Foot Ulcer: No Prior Amputation: No Charcot  Joint: No Ambulatory Status: Ambulatory With Help Assistance Device: Walker Gait: Steady Electronic Signature(s) Signed: 01/11/2021 4:18:59 PM By: 01/13/2021 RN, BSN Entered By: Zenaida Deed on 01/11/2021 15:27:08 -------------------------------------------------------------------------------- Nutrition Risk Screening Details Patient Name: Date of Service: CO 01/13/2021, Benjamin [name] V ID L. 01/11/2021 2:45 PM Medical Record Number: 01/13/2021 Patient Account Number: [id number] Date of Birth/Sex: Treating RN: 11/16/1960 (60 y.o. 77 Primary Care Rosan Calbert: Benjamin [name] Other Clinician: Referring Zakhari Fogel: Treating Evea Sheek/Extender: Arnette Felts Weeks in Treatment: 0 Height (in): 66 Weight (lbs): 144 Body Mass Index (BMI): 23.2 Nutrition Risk Screening Items Score Screening NUTRITION RISK SCREEN: I have an illness or condition that made me change the kind and/or amount of food I eat 0 No I eat fewer than two meals per day 0 No I eat few fruits and vegetables, or milk products 0 No I have three or more drinks of beer, liquor or wine almost every day 0 No I have tooth or mouth problems that make it hard for me to eat 0 No I don't always have enough money to buy the food I need 0 No I eat alone most of the time 1 Yes I take three or more different prescribed or over-the-counter drugs a day 1 Yes Without wanting to, I have lost or gained 10 pounds in the last six months 2 Yes I am not always physically able to shop, cook and/or feed myself 0 No Nutrition Protocols Good Risk Protocol Moderate Risk Protocol 0 Provide education on nutrition High Risk Proctocol Risk Level: Moderate Risk Score: 4 Electronic Signature(s) Signed: 01/11/2021 4:18:59 PM By: 01/13/2021 RN, BSN Entered By: Zenaida Deed on 01/11/2021 15:26:49

## 2021-01-11 NOTE — Progress Notes (Addendum)
DEJOHN, IBARRA ([id number]) Visit Report for 01/11/2021 Allergy List Details Patient Name: Date of Service: [place], [place] V ID L. 01/11/2021 2:45 PM Medical Record Number: [id number] Patient Account Number: [id number] Date of Birth/Sex: Treating RN: May 03, 1960 (60 y.o. Damaris Schooner Primary Care Attilio Zeitler: Arnette Felts Other Clinician: Referring Peggi Yono: Treating Haileigh Pitz/Extender: Tempie Donning Weeks in Treatment: 0 Allergies Active Allergies aspirin garlic Allergy Notes Electronic Signature(s) Signed: 01/11/2021 4:18:59 PM By: Zenaida Deed RN, BSN Entered By: Zenaida Deed on 01/11/2021 15:17:11 -------------------------------------------------------------------------------- Arrival Information Details Patient Name: Date of Service: [place] Izora [place], [place] V ID L. 01/11/2021 2:45 PM Medical Record Number: [id number] Patient Account Number: [id number] Date of Birth/Sex: Treating RN: 11/22/1960 (60 y.o. Damaris Schooner Primary Care Manav Pierotti: Arnette Felts Other Clinician: Referring Garrell Flagg: Treating Kamarii Buren/Extender: Maryruth Bun in Treatment: 0 Visit Information Patient Arrived: Wheel Chair Arrival Time: 15:09 Accompanied By: facility staff Transfer Assistance: None Patient Identification Verified: Yes Secondary Verification Process Completed: Yes Patient Requires Transmission-Based Precautions: No Patient Has Alerts: No History Since Last Visit Has Dressing in Place as Prescribed: No Pain Present Now: Yes Electronic Signature(s) Signed: 01/11/2021 4:18:59 PM By: Zenaida Deed RN, BSN Entered By: Zenaida Deed on 01/11/2021 15:15:04 -------------------------------------------------------------------------------- Clinic Level of Care Assessment Details Patient Name: Date of Service: [place], [place] V ID L. 01/11/2021 2:45 PM Medical Record Number: [id number] Patient Account Number: [id number] Date of Birth/Sex: Treating  RN: [birth year] (60 y.o. Damaris Schooner Primary Care Talley Casco: Arnette Felts Other Clinician: Referring Robi Dewolfe: Treating Gurpreet Mikhail/Extender: Tempie Donning Weeks in Treatment: 0 Clinic Level of Care Assessment Items TOOL 2 Quantity Score []  - 0 Use when only an EandM is performed on the INITIAL visit ASSESSMENTS - Nursing Assessment / Reassessment X- 1 20 General Physical Exam (combine w/ comprehensive assessment (listed just below) when performed on new pt. evals) X- 1 25 Comprehensive Assessment (HX, ROS, Risk Assessments, Wounds Hx, etc.) ASSESSMENTS - Wound and Skin A ssessment / Reassessment X - Simple Wound Assessment / Reassessment - one wound 1 5 []  - 0 Complex Wound Assessment / Reassessment - multiple wounds []  - 0 Dermatologic / Skin Assessment (not related to wound area) ASSESSMENTS - Ostomy and/or Continence Assessment and Care []  - 0 Incontinence Assessment and Management []  - 0 Ostomy Care Assessment and Management (repouching, etc.) PROCESS - Coordination of Care X - Simple Patient / Family Education for ongoing care 1 15 []  - 0 Complex (extensive) Patient / Family Education for ongoing care X- 1 10 Staff obtains , Records, T Results / Process Orders est X- 1 10 Staff telephones HHA, Nursing Homes / Clarify orders / etc []  - 0 Routine Transfer to another Facility (non-emergent condition) []  - 0 Routine Hospital Admission (non-emergent condition) []  - 0 New Admissions / / Ordering NPWT Apligraf, etc. , X- 1 20 Emergency Hospital Admission (emergent condition) X- 1 10 Simple Discharge Coordination []  - 0 Complex (extensive) Discharge Coordination PROCESS - Special Needs []  - 0 Pediatric / Minor Patient Management []  - 0 Isolation Patient Management []  - 0 Hearing / Language / Visual special needs []  - 0 Assessment of Community assistance (transportation, D/C planning, etc.) []  - 0 Additional  assistance / Altered mentation []  - 0 Support Surface(s) Assessment (bed, cushion, seat, etc.) INTERVENTIONS - Wound Cleansing / Measurement X- 1 5 Wound Imaging (photographs - any number of wounds) []  - 0 Wound Tracing (instead of photographs) X- 1 5 Simple Wound  Measurement - one wound []  - 0 Complex Wound Measurement - multiple wounds X- 1 5 Simple Wound Cleansing - one wound []  - 0 Complex Wound Cleansing - multiple wounds INTERVENTIONS - Wound Dressings X - Small Wound Dressing one or multiple wounds 1 10 []  - 0 Medium Wound Dressing one or multiple wounds []  - 0 Large Wound Dressing one or multiple wounds []  - 0 Application of Medications - injection INTERVENTIONS - Miscellaneous []  - 0 External ear exam []  - 0 Specimen Collection (cultures, biopsies, blood, body fluids, etc.) []  - 0 Specimen(s) / Culture(s) sent or taken to Lab for analysis []  - 0 Patient Transfer (multiple staff / Lift / Similar devices) []  - 0 Simple Staple / Suture removal (25 or less) []  - 0 Complex Staple / Suture removal (26 or more) []  - 0 Hypo / Hyperglycemic Management (close monitor of Blood Glucose) []  - 0 Ankle / Brachial Index (ABI) - do not check if billed separately Has the patient been seen at the hospital within the last three years: Yes Total Score: 140 Level Of Care: New/Established - Level 4 Electronic Signature(s) Signed: 01/11/2021 4:18:59 PM By: RN, BSN Entered By: on 01/11/2021 16:17:05 -------------------------------------------------------------------------------- Encounter Discharge Information Details Patient Name: Date of Service: CO [address] [place], V ID L. 01/11/2021 2:45 PM Medical Record Number: Michiel Sites Patient Account Number: Date of Birth/Sex: Treating RN: 1960/05/30 (60 y.o. Primary Care Tilton Marsalis: 01/13/2021 Other Clinician: Referring Tamara Monteith: Treating Bryanda Mikel/Extender: Zenaida Deed in Treatment: 0 Encounter Discharge Information Items Discharge Condition: Stable Ambulatory Status: Wheelchair Discharge Destination: Skilled Nursing Facility Telephoned: No Orders Sent: Yes Transportation: Other Accompanied By: facility staff Schedule Follow-up Appointment: Yes Clinical Summary of Care: Patient Declined Notes facility transportation Electronic Signature(s) Signed: 01/11/2021 4:18:59 PM By: 01/13/2021 RN, BSN Entered By: [address] on 01/11/2021 16:18:28 -------------------------------------------------------------------------------- Lower Extremity Assessment Details Patient Name: Date of Service: [place], 01/13/2021 V ID L. 01/11/2021 2:45 PM Medical Record Number: [id number] Patient Account Number: 05/27/1960 Date of Birth/Sex: Treating RN: 1960-10-29 (60 y.o. Arnette Felts Primary Care Chanele Douglas: Maryruth Bun Other Clinician: Referring Dex Blakely: Treating Windi Toro/Extender: 01/13/2021 Weeks in Treatment: 0 Electronic Signature(s) Signed: 01/11/2021 4:18:59 PM By: Zenaida Deed RN, BSN Entered By: 01/13/2021 on 01/11/2021 15:27:17 -------------------------------------------------------------------------------- Multi Wound Chart Details Patient Name: Date of Service: [place], 01/13/2021 V ID L. 01/11/2021 2:45 PM Medical Record Number: [id number] Patient Account Number: 05/27/1960 Date of Birth/Sex: Treating RN: 1960/03/05 (60 y.o. Arnette Felts Primary Care Sissi Padia: Tempie Donning Other Clinician: Referring Burris Matherne: Treating Prudie Guthridge/Extender: 01/13/2021 Weeks in Treatment: 0 Vital Signs Height(in): 66 Pulse(bpm): 113 Weight(lbs): 144 Blood Pressure(mmHg): 166/100 Body Mass Index(BMI): 23 Temperature(F): 97.8 Respiratory Rate(breaths/min): 18 Photos: [N/A:N/A] Left Amputation Site - Above Knee N/A N/A Wound Location: Surgical Injury N/A N/A Wounding Event: Open  Surgical Wound N/A N/A Primary Etiology: Arterial Insufficiency Ulcer N/A N/A Secondary Etiology: Anemia, Hypertension, Peripheral N/A N/A Comorbid History: Arterial Disease 12/07/2020 N/A N/A Date Acquired: 0 N/A N/A Weeks of Treatment: Open N/A N/A Wound Status: 0.4x1x0.1 N/A N/A Measurements L x W x D (cm) 0.314 N/A N/A A (cm) : rea 0.031 N/A N/A Volume (cm) : 0.00% N/A N/A % Reduction in Area: 0.00% N/A N/A % Reduction in Volume: Full Thickness Without Exposed N/A N/A Classification: Support Structures Small N/A N/A Exudate Amount: Serosanguineous N/A N/A Exudate Type: red, brown N/A N/A Exudate  Color: Flat and Intact N/A N/A Wound Margin: Large (67-100%) N/A N/A Granulation Amount: Red, Pink N/A N/A Granulation Quality: None Present (0%) N/A N/A Necrotic Amount: Fat Layer (Subcutaneous Tissue): Yes N/A N/A Exposed Structures: Fascia: No Tendon: No Muscle: No Joint: No Bone: No Small (1-33%) N/A N/A Epithelialization: Treatment Notes Wound #2 (Amputation Site - Above Knee) Wound Laterality: Left Cleanser Peri-Wound Care Topical Primary Dressing KerraCel Ag Gelling Fiber Dressing, 2x2 in (silver alginate) Discharge Instruction: Apply silver alginate to wound bed as instructed Secondary Dressing ABD Pad, 8x10 Discharge Instruction: Apply over primary dressing as directed. Secured With 75M Medipore H Soft Cloth Surgical T ape, 4 x 10 (in/yd) Discharge Instruction: Secure with tape as directed. Compression Wrap Compression Stockings Add-Ons Electronic Signature(s) Signed: 01/11/2021 4:40:45 PM By: Geralyn Corwin DO Signed: 01/11/2021 4:56:39 PM By: Bo Mcclintock By: Geralyn Corwin on 01/11/2021 16:27:44 -------------------------------------------------------------------------------- Multi-Disciplinary Care Plan Details Patient Name: Date of Service: CO [address] [place], [place] V ID L. 01/11/2021 2:45 PM Medical Record Number:  [id number] Patient Account Number: [id number] Date of Birth/Sex: Treating RN: Mar 09, 1960 (60 y.o. Damaris Schooner Primary Care Lenya Sterne: Arnette Felts Other Clinician: Referring Amritpal Shropshire: Treating Haly Feher/Extender: Maryruth Bun in Treatment: 0 Multidisciplinary Care Plan reviewed with physician Active Inactive Abuse / Safety / Falls / Self Care Management Nursing Diagnoses: History of Falls Potential for falls Goals: Patient/caregiver will verbalize/demonstrate measures taken to prevent injury and/or falls Date Initiated: 01/11/2021 Target Resolution Date: 02/08/2021 Goal Status: Active Interventions: Assess fall risk on admission and as needed Assess impairment of mobility on admission and as needed per policy Notes: Wound/Skin Impairment Nursing Diagnoses: Impaired tissue integrity Knowledge deficit related to ulceration/compromised skin integrity Goals: Patient/caregiver will verbalize understanding of skin care regimen Date Initiated: 01/11/2021 Target Resolution Date: 02/08/2021 Goal Status: Active Ulcer/skin breakdown will have a volume reduction of 30% by week 4 Date Initiated: 01/11/2021 Target Resolution Date: 02/08/2021 Goal Status: Active Interventions: Assess patient/caregiver ability to obtain necessary supplies Assess patient/caregiver ability to perform ulcer/skin care regimen upon admission and as needed Assess ulceration(s) every visit Provide education on ulcer and skin care Treatment Activities: Skin care regimen initiated : 01/11/2021 Topical wound management initiated : 01/11/2021 Notes: Electronic Signature(s) Signed: 01/11/2021 4:18:59 PM By: Zenaida Deed RN, BSN Entered By: Zenaida Deed on 01/11/2021 16:07:41 -------------------------------------------------------------------------------- Pain Assessment Details Patient Name: Date of Service: [place] Iz[place], [place] V ID L. 01/11/2021 2:45 PM Medical Record Number:  [id number] Patient Account Number: [id number] Date of Birth/Sex: Treating RN: 1960/04/23 (61 y.o. Damaris Schooner Primary Care Jessabelle Markiewicz: Arnette Felts Other Clinician: Referring Teriyah Purington: Treating Makyle Eslick/Extender: Tempie Donning Weeks in Treatment: 0 Active Problems Location of Pain Severity and Description of Pain Patient Has Paino Yes Site Locations Pain Location: Pain in Ulcers With Dressing Change: Yes Duration of the Pain. Constant / Intermittento Intermittent Rate the pain. Current Pain Level: 2 Worst Pain Level: 4 Least Pain Level: 0 Character of Pain Describe the Pain: Tender, Other: Sore Pain Management and Medication Current Pain Management: Medication: Yes Is the Current Pain Management Adequate: Adequate How does your wound impact your activities of daily livingo Sleep: No Bathing: No Appetite: No Relationship With Others: No Bladder Continence: No Emotions: No Bowel Continence: No Work: No Toileting: No Drive: No Dressing: No Hobbies: No Electronic Signature(s) Signed: 01/11/2021 4:18:59 PM By: Zenaida Deed RN, BSN Entered By: Zenaida Deed on 01/11/2021 15:37:47 -------------------------------------------------------------------------------- Patient/Caregiver Education Details Patient Name: Date of Service: [place] L.  11/21/2022andnbsp2:45 PM Medical Record Number: [id number] Patient Account Number: [id number] Date of Birth/Gender: Treating RN: Apr 16, 1960 (60 y.o. Damaris Schooner Primary Care Physician: Arnette Felts Other Clinician: Referring Physician: Treating Physician/Extender: Maryruth Bun in Treatment: 0 Education Assessment Education Provided To: Patient Education Topics Provided Safety: Methods: Explain/Verbal Responses: Reinforcements needed, State content correctly Wound/Skin Impairment: Methods: Explain/Verbal Responses: Reinforcements needed, State content  correctly Electronic Signature(s) Signed: 01/11/2021 4:18:59 PM By: Zenaida Deed RN, BSN Entered By: Zenaida Deed on 01/11/2021 16:08:05 -------------------------------------------------------------------------------- Wound Assessment Details Patient Name: Date of Service: [place], [place] V ID L. 01/11/2021 2:45 PM Medical Record Number: [id number] Patient Account Number: [id number] Date of Birth/Sex: Treating RN: 23-May-1960 (60 y.o. 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 Wounding Event: Surgical Injury Wound Status: Open Date Acquired: 12/07/2020 Comorbid History: Anemia, Hypertension, Peripheral Arterial Disease Weeks Of Treatment: 0 Clustered Wound: No Photos Wound Measurements Length: (cm) 0.4 Width: (cm) 1 Depth: (cm) 0.1 Area: (cm) 0.314 Volume: (cm) 0.031 % Reduction in Area: 0% % Reduction in Volume: 0% Epithelialization: Small (1-33%) Tunneling: No Undermining: No Wound Description Classification: Full Thickness Without Exposed Support Structures Wound Margin: Flat and Intact Exudate Amount: Small Exudate Type: Serosanguineous Exudate Color: red, brown Foul Odor After Cleansing: No Slough/Fibrino No Wound Bed Granulation Amount: Large (67-100%) Exposed Structure Granulation Quality: Red, Pink Fascia Exposed: No Necrotic Amount: None Present (0%) Fat Layer (Subcutaneous Tissue) Exposed: Yes Tendon Exposed: No Muscle Exposed: No Joint Exposed: No Bone Exposed: No Treatment Notes Wound #2 (Amputation Site - Above Knee) Wound Laterality: Left Cleanser Peri-Wound Care Topical Primary Dressing KerraCel Ag Gelling Fiber Dressing, 2x2 in (silver  alginate) Discharge Instruction: Apply silver alginate to wound bed as instructed Secondary Dressing ABD Pad, 8x10 Discharge Instruction: Apply over primary dressing as directed. Secured With 32M Medipore H Soft Cloth Surgical T ape, 4 x 10 (in/yd) Discharge Instruction: Secure with tape as directed. Compression Wrap Compression Stockings Add-Ons Electronic Signature(s) Signed: 01/11/2021 4:18:59 PM By: Zenaida Deed RN, BSN Entered By: Zenaida Deed on 01/11/2021 15:35:26 -------------------------------------------------------------------------------- Vitals Details Patient Name: Date of Service: [place] Iz[place], [place] V ID L. 01/11/2021 2:45 PM Medical Record Number: [id number] Patient Account Number: [id number] Date of Birth/Sex: Treating RN: 1960-12-21 (60 y.o. Damaris Schooner Primary Care Krrish Freund: Arnette Felts Other Clinician: Referring Avea Mcgowen: Treating Joani Cosma/Extender: Tempie Donning Weeks in Treatment: 0 Vital Signs Time Taken: 15:15 Temperature (F): 97.8 Height (in): 66 Pulse (bpm): 113 Source: Stated Respiratory Rate (breaths/min): 18 Weight (lbs): 144 Blood Pressure (mmHg): 166/100 Source: Stated Reference Range: 80 - 120 mg / dl Body Mass Index (BMI): 23.2 Electronic Signature(s) Signed: 01/11/2021 4:18:59 PM By: Zenaida Deed RN, BSN Entered By: Zenaida Deed on 01/11/2021 15:16:05

## 2021-01-13 ENCOUNTER — Telehealth: Payer: Self-pay | Admitting: Licensed Clinical Social Worker

## 2021-01-13 NOTE — Telephone Encounter (Signed)
Reached out to pt sister [facility], she shares that pt is doing okay per his reports (she hasnt spoken with staff at [facility]). She thinks his d/c home may be around 11/28. She is aware of all of his upcoming appointments and they plan to have him join them tomorrow for thanksgiving meal. LCSW encouraged pt sister to f/u if any additional questions/concerns.   [name], MSW, LCSW Clinical Social Worker II [facility] Navigation  [phone]- work cell phone (preferred) [phone]- desk phone

## 2021-01-18 ENCOUNTER — Encounter: Payer: Medicaid Other | Admitting: Surgery

## 2021-01-20 ENCOUNTER — Telehealth: Payer: Self-pay | Admitting: Licensed Clinical Social Worker

## 2021-01-20 NOTE — Telephone Encounter (Signed)
Pt called and spoke with this writer from [phone]. He is asking how long he has to stay at SNF and when he can return home as he feels he is ready. LCSW shared that unfortunately I do not have access to discharge planning from SNF and am not able to coordinate that process. I encouraged him to speak with the social worker/discharge planner at [facility] to let them know of interest in discharge and to inquire if there is a plan in place for that. Pt shares he will do so and is aware of appt tomorrow at [facility] for f/u. He is still eager to get a prosthetic, I provided support for this but cautioned that it is dependent on the heeling of his stump and safety before he can be fitted for any form of prosthetic.   I will f/u with pt/pt sister on Monday.   Benjamin [name], MSW, LCSW Clinical Social Worker II [facility] Navigation  [phone]- work cell phone (preferred) [phone]- desk phone

## 2021-01-21 ENCOUNTER — Ambulatory Visit (INDEPENDENT_AMBULATORY_CARE_PROVIDER_SITE_OTHER): Payer: Medicaid Other | Admitting: Vascular Surgery

## 2021-01-21 ENCOUNTER — Other Ambulatory Visit: Payer: Self-pay

## 2021-01-21 ENCOUNTER — Encounter: Payer: Self-pay | Admitting: Vascular Surgery

## 2021-01-21 VITALS — BP 152/93 | HR 104 | Temp 98.3°F | Resp 20 | Ht 67.0 in | Wt 124.0 lb

## 2021-01-21 DIAGNOSIS — Z89612 Acquired absence of left leg above knee: Secondary | ICD-10-CM

## 2021-01-21 DIAGNOSIS — I739 Peripheral vascular disease, unspecified: Secondary | ICD-10-CM

## 2021-01-21 NOTE — Progress Notes (Signed)
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 male with a history of multilevel arterial occlusive disease.  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  This was performed on 12/08/2020.  This patient had been followed for some time with severe peripheral arterial disease by Dr. Myra Gianotti.  Is undergone multiple previous interventions.   He has no specific complaints and does not have significant pain at his amputation site.  His groin incisions of healed.  Current Outpatient Medications  Medication Sig Dispense Refill   acetaminophen (TYLENOL) 325 MG tablet Take 1-2 tablets (325-650 mg total) by mouth every 4 (four) hours as needed for mild pain. 200 tablet 0   ascorbic acid (VITAMIN C) 500 MG tablet Take 1 tablet (500 mg total) by mouth daily. 30 tablet 0   atorvastatin (LIPITOR) 20 MG tablet Take 1 tablet (20 mg total) by mouth daily. 90 tablet 3   carvedilol (COREG) 6.25 MG tablet Take 1 tablet (6.25 mg total) by mouth 2 (two) times daily with a meal. 60 tablet 0   cloNIDine (CATAPRES - DOSED IN MG/24 HR) 0.1 mg/24hr patch Place 1 patch (0.1 mg total) onto the skin once a week. On tuesdays 4 patch 0   clopidogrel (PLAVIX) 75 MG tablet Take 1 tablet (75 mg total) by mouth daily. 90 tablet 3   docusate sodium (COLACE) 100 MG capsule Take 1 capsule (100 mg total) by mouth daily. 30 capsule 0   gabapentin (NEURONTIN) 300 MG capsule Take 1 capsule (300 mg total) by mouth 2 (two) times daily. 60 capsule 0   gabapentin (NEURONTIN) 400 MG capsule Take 1 capsule (400 mg total) by mouth 2 (two) times daily.     guaiFENesin-dextromethorphan (ROBITUSSIN DM) 100-10 MG/5ML syrup Take 15 mLs by mouth every 4 (four) hours as needed for cough. 118 mL 0   hydrOXYzine  (ATARAX/VISTARIL) 25 MG tablet Take 1 tablet (25 mg total) by mouth at bedtime as needed for anxiety. 30 tablet 0   lactose free nutrition (BOOST) LIQD Take 237 mLs by mouth 2 (two) times daily between meals.     methocarbamol (ROBAXIN) 500 MG tablet Take 1 tablet (500 mg total) by mouth every 6 (six) hours as needed for muscle spasms. 30 tablet 0   Multiple Vitamin (MULTIVITAMIN WITH MINERALS) TABS tablet Take 1 tablet by mouth daily.     nicotine (NICODERM CQ - DOSED IN MG/24 HOURS) 21 mg/24hr patch Place 1 patch (21 mg total) onto the skin daily. 28 patch 0   oxyCODONE-acetaminophen (PERCOCET/ROXICET) 5-325 MG tablet Take 1-2 tablets by mouth every 8 (eight) hours as needed for severe pain. 20 tablet 0   pantoprazole (PROTONIX) 40 MG tablet Take 1 tablet (40 mg total) by mouth daily. 30 tablet 0   senna (SENOKOT) 8.6 MG TABS tablet Take 1 tablet (8.6 mg total) by mouth 2 (two) times daily. 60 tablet 0   traMADol (ULTRAM) 50 MG tablet Take 1 tablet (50 mg total) by mouth every 12 (twelve) hours as needed for moderate pain. 10 tablet 0   traZODone (DESYREL) 50 MG tablet Take 0.5-1 tablets (25-50 mg total) by mouth at bedtime as needed for sleep. 30 tablet 0   Zinc Sulfate 220 (50 Zn) MG TABS Take  1 tablet (220 mg total) by mouth daily. 30 tablet 0   No current facility-administered medications for this visit.    REVIEW OF SYSTEMS:  [X]  denotes positive finding, [ ]  denotes negative finding Vascular    Leg swelling    Cardiac    Chest pain or chest pressure:    Shortness of breath upon exertion:    Short of breath when lying flat:    Irregular heart rhythm:    Constitutional    Fever or chills:     PHYSICAL EXAM:   Vitals:   01/21/21 1425  BP: (!) 152/93  Pulse: (!) 104  Resp: 20  Temp: 98.3 F (36.8 C)  SpO2: 98%  Weight: 124 lb (56.2 kg)  Height: 5\' 7"  (1.702 m)    GENERAL: The patient is a well-nourished male, in no acute distress. The vital signs are documented  above. CARDIOVASCULAR: There is a regular rate and rhythm. PULMONARY: There is good air exchange bilaterally without wheezing or rales. He is left AKA is healing nicely.   I removed his staples in the office today.  I applied Steri-Strips.   DATA:   No new data  MEDICAL ISSUES:   S/P LEFT ABOVE-THE-KNEE AMPUTATION: His left above-the-knee amputation is healing well.  We removed his staples today.  He is scheduled back in our office early next year to continue to follow his peripheral arterial disease and his right leg.  Once we know that the amputation site is healed well he could be considered for a prosthesis.  [facility] [phone]

## 2021-01-22 ENCOUNTER — Other Ambulatory Visit: Payer: Self-pay

## 2021-01-22 DIAGNOSIS — I739 Peripheral vascular disease, unspecified: Secondary | ICD-10-CM

## 2021-01-25 ENCOUNTER — Encounter (HOSPITAL_BASED_OUTPATIENT_CLINIC_OR_DEPARTMENT_OTHER): Payer: Medicaid Other | Admitting: Internal Medicine

## 2021-01-26 ENCOUNTER — Encounter: Payer: Medicaid Other | Admitting: Physical Medicine and Rehabilitation

## 2021-01-28 ENCOUNTER — Telehealth: Payer: Self-pay | Admitting: Licensed Clinical Social Worker

## 2021-01-28 NOTE — Telephone Encounter (Signed)
LCSW spoke with pt sister [name] this morning as a check in.  Pt still not home, unfortunately his test cane back positive for COVID Monday after he returned from his [facility] appt. Pt cancelled his other appts this week and per sister he is supposed to be in quarantine for 7 days. Pt currently upset bc he had a private room but now is sharing with another resident that has COVID. Pt eager to come home. [name] shares that she would like pt to finish quarantine at SNF then return home, she is okay with me changing his appt for wound care on Monday. I will check in with pt/pt sister next week- encouraged her to let me know before that time if he needs anything and to tell him I am thinking about him and hoping that he continues to feel well.   [facility] and had pt changed to appt on 12/19 which will be 14 days from + test.  Appt still at 10am.    [name], MSW, LCSW Clinical Social Worker II [facility] Navigation  [phone]- work cell phone (preferred) [phone]- desk phone

## 2021-02-01 ENCOUNTER — Encounter (HOSPITAL_BASED_OUTPATIENT_CLINIC_OR_DEPARTMENT_OTHER): Payer: Medicaid Other | Admitting: Internal Medicine

## 2021-02-05 ENCOUNTER — Telehealth: Payer: Self-pay | Admitting: Licensed Clinical Social Worker

## 2021-02-05 NOTE — Telephone Encounter (Signed)
LCSW reached out to pt sister to f/u w/ her and pt since + COVID test last week. Was able to reach pt sister [name] at [phone]. Pt has not yet returned home, but she shares that hopefully it will be before christmas. She contacted the wound care center to let them know and they cancelled visit for Monday- requested she contact them when he is discharged. Pt sister shares he is in decent spirits, only complaint is issues w/ his television in his room.   I will f/u with pt sister next week to see the status of return home. Pt has a new cell number, added this to chart.    [name], MSW, LCSW Clinical Social Worker II [facility] Navigation  [phone]- work cell phone (preferred) [phone]- desk phone

## 2021-02-08 ENCOUNTER — Encounter (HOSPITAL_BASED_OUTPATIENT_CLINIC_OR_DEPARTMENT_OTHER): Payer: Medicaid Other | Admitting: Internal Medicine

## 2021-02-09 ENCOUNTER — Telehealth: Payer: Self-pay | Admitting: Licensed Clinical Social Worker

## 2021-02-09 NOTE — Telephone Encounter (Signed)
LCSW received call from CSW at [facility].  I directed her to call PCP or [facility] office for any additional DME/HH needs for discharge.   [name], MSW, LCSW Clinical Social Worker II [facility] Navigation  [phone]- work cell phone (preferred) [phone]- desk phone

## 2021-02-09 NOTE — Telephone Encounter (Signed)
Received a call from pt sister [facility], she shares that pt expected return home tomorrow. She inquires if I need to provide them with anything. I explained that they should contact pt PCP and get an appt with her for general check up. He has an appt on 03/04/21 w/ PM&R. I have provided pt sister with the following numbers: PCP, PM&R clinic, and [employer] transportation/[employer] number. I encouraged her to not let pt drive until he has been cleared by a physician. I remain available as needed.   [name], MSW, LCSW Clinical Social Worker II [facility] Navigation  [phone]- work cell phone (preferred) [phone]- desk phone

## 2021-02-10 ENCOUNTER — Other Ambulatory Visit: Payer: Self-pay

## 2021-02-10 ENCOUNTER — Telehealth: Payer: Self-pay | Admitting: Licensed Clinical Social Worker

## 2021-02-10 MED ORDER — NICOTINE 7 MG/24HR TD PT24
7.0000 mg | MEDICATED_PATCH | Freq: Every day | TRANSDERMAL | 0 refills | Status: DC
  Filled 2021-02-10: qty 14, 14d supply, fill #0

## 2021-02-10 MED ORDER — GABAPENTIN 400 MG PO CAPS
400.0000 mg | ORAL_CAPSULE | Freq: Every evening | ORAL | 0 refills | Status: DC | PRN
  Filled 2021-02-10: qty 90, 30d supply, fill #0

## 2021-02-10 MED ORDER — NICOTINE 14 MG/24HR TD PT24
14.0000 mg | MEDICATED_PATCH | Freq: Every day | TRANSDERMAL | 0 refills | Status: DC
Start: 2021-02-10 — End: 2021-02-24
  Filled 2021-02-10: qty 14, 14d supply, fill #0

## 2021-02-10 MED ORDER — HYDROXYZINE HCL 25 MG PO TABS
25.0000 mg | ORAL_TABLET | Freq: Every day | ORAL | 0 refills | Status: DC
  Filled 2021-02-10: qty 30, 30d supply, fill #0

## 2021-02-10 MED ORDER — PANTOPRAZOLE SODIUM 40 MG PO TBEC
40.0000 mg | DELAYED_RELEASE_TABLET | Freq: Every day | ORAL | 0 refills | Status: DC
  Filled 2021-02-10: qty 30, 30d supply, fill #0

## 2021-02-10 MED ORDER — CLONIDINE 0.1 MG/24HR TD PTWK
0.1000 mg | MEDICATED_PATCH | TRANSDERMAL | 0 refills | Status: DC
Start: 2021-02-10 — End: 2021-03-16
  Filled 2021-02-10: qty 4, 28d supply, fill #0

## 2021-02-10 MED ORDER — CLOPIDOGREL BISULFATE 75 MG PO TABS
75.0000 mg | ORAL_TABLET | Freq: Every day | ORAL | 0 refills | Status: DC
  Filled 2021-02-10: qty 30, 30d supply, fill #0

## 2021-02-10 MED ORDER — CARVEDILOL 6.25 MG PO TABS
6.2500 mg | ORAL_TABLET | Freq: Two times a day (BID) | ORAL | 0 refills | Status: DC
  Filled 2021-02-10: qty 60, 30d supply, fill #0

## 2021-02-10 MED ORDER — TRAZODONE HCL 50 MG PO TABS
50.0000 mg | ORAL_TABLET | Freq: Every evening | ORAL | 0 refills | Status: DC | PRN
Start: 2021-02-10 — End: 2021-03-16
  Filled 2021-02-10: qty 30, 30d supply, fill #0

## 2021-02-10 MED ORDER — METHOCARBAMOL 500 MG PO TABS
500.0000 mg | ORAL_TABLET | Freq: Four times a day (QID) | ORAL | 0 refills | Status: DC | PRN
  Filled 2021-02-10: qty 120, 30d supply, fill #0

## 2021-02-10 MED ORDER — ATORVASTATIN CALCIUM 20 MG PO TABS
20.0000 mg | ORAL_TABLET | Freq: Every day | ORAL | 0 refills | Status: DC
Start: 2021-02-10 — End: 2021-03-16
  Filled 2021-02-10: qty 30, 30d supply, fill #0

## 2021-02-10 NOTE — Telephone Encounter (Signed)
[name] (pt sister) called [facility] this morning asking about what pharmacy pt utilizes. She shares that her family will provide pt with ride home.   Provided her [employer] [facility] information (address and phone number). [name] asked if pt was going to have a nurse coming to the house. I shared that if he did it would be coordinated through the discharge coordinator at [facility]. If they have not then he would need to contact either the nurse at [facility] or his PCP office to determine if outpatient PT would potentially be an option. Pt sister has written all of this down. I remain available.   [name], MSW, LCSW Clinical Social Worker II [facility] Navigation  [phone]- work cell phone (preferred) [phone]- desk phone

## 2021-02-10 NOTE — Telephone Encounter (Signed)
LCSW received a call from pt this evening, he shares he needs a ride home from SNF. LCSW shared that his discharge should be coordinated by the staff at SNF, I three way called pt sister [facility]. I spoke with both of them about the need to communicate directly with staff at SNF about discharge and that they are responsible for ordering needed items/coordinating discharge and that I remain available to answer questions as needed but cannot coordinate those needs until pt is back home. Both state understanding at this time.   [name], MSW, LCSW Clinical Social Worker II [facility] Navigation  [phone]- work cell phone (preferred) [phone]- desk phone

## 2021-02-16 ENCOUNTER — Telehealth: Payer: Self-pay | Admitting: Licensed Clinical Social Worker

## 2021-02-16 ENCOUNTER — Other Ambulatory Visit: Payer: Self-pay

## 2021-02-16 NOTE — Telephone Encounter (Signed)
LCSW received a call from pt and pt sister.  They state they cant get in touch with pharmacy and think it is closed. LCSW again provided [facility] pharmacy number to pt sister and confirmed they are not closed today. Pt sister to call back if any issues.   [name], MSW, LCSW Clinical Social Worker II [facility] Navigation  [phone]- work cell phone (preferred) [phone]- desk phone

## 2021-02-18 ENCOUNTER — Other Ambulatory Visit: Payer: Self-pay

## 2021-02-18 NOTE — Patient Outreach (Signed)
Medicaid Managed Care   Nurse Care Manager Note  02/18/2021 Name:  Benjamin [name] MRN:  [id number] DOB:  [birth year]  Benjamin [name] is an 60 y.o. year old male who is a primary patient of [name], [place].  [facility] Managed Care Coordination team was consulted for assistance with:    HTN PAD with status post left AKA on 10/04/2020 and revision on 12/08/2020  Benjamin [name] was given information about Medicaid Managed Care Coordination team services today. Benjamin [name] Patient agreed to services and verbal consent obtained.  Engaged with patient by telephone for initial visit in response to provider referral for case management and/or care coordination services.   Assessments/Interventions:  Review of past medical history, allergies, medications, health status, including review of consultants reports, laboratory and other test data, was performed as part of comprehensive evaluation and provision of chronic care management services.  SDOH (Social Determinants of Health) assessments and interventions performed: SDOH Interventions    Flowsheet Row Most Recent Value  SDOH Interventions   Food Insecurity Interventions Intervention Not Indicated  Housing Interventions Intervention Not Indicated  Physical Activity Interventions Intervention Not Indicated  Stress Interventions Intervention Not Indicated  Social Connections Interventions Intervention Not Indicated  Transportation Interventions Intervention Not Indicated       Care Plan  Allergies  Allergen Reactions   Aspirin Nausea And Vomiting   Garlic Other (See Comments)    sneezing    Medications Reviewed Today     Reviewed by Inge Rise, RN (Case Manager) on 02/18/21 at 1009  Med List Status: <None>   Medication Order Taking? Sig Documenting Provider Last Dose Status Informant  acetaminophen (TYLENOL) 325 MG tablet [id number] Yes Take 1-2 tablets (325-650 mg total) by mouth every 4 (four) hours as needed for mild pain.  Bary Leriche, PA-C Taking Active   ascorbic acid (VITAMIN C) 500 MG tablet [id number] Yes Take 1 tablet (500 mg total) by mouth daily. Bary Leriche, PA-C Taking Active   atorvastatin (LIPITOR) 20 MG tablet [id number] Yes Take 1 tablet (20 mg total) by mouth daily. Serafina Mitchell, MD Taking Active Self  atorvastatin (LIPITOR) 20 MG tablet [id number] Yes Take 1 tablet (20 mg total) by mouth daily.  Taking Active   carvedilol (COREG) 6.25 MG tablet [id number] Yes Take 1 tablet (6.25 mg total) by mouth 2 (two) times daily with a meal. Love, Pamela S, PA-C Taking Active   carvedilol (COREG) 6.25 MG tablet [id number] Yes Take 1 tablet (6.25 mg total) by mouth 2 (two) times daily with a meal.  Taking Active   cloNIDine (CATAPRES - DOSED IN MG/24 HR) 0.1 mg/24hr patch [id number] Yes Place 1 patch (0.1 mg total) onto the skin once a week. On tuesdays Love, Pamela S, PA-C Taking Active   cloNIDine (CATAPRES - DOSED IN MG/24 HR) 0.1 mg/24hr patch [id number] Yes Place 1 patch (0.1 mg total) onto the skin once a week. (on Tuesday)  Taking Active   clopidogrel (PLAVIX) 75 MG tablet [id number] Yes Take 1 tablet (75 mg total) by mouth daily. Serafina Mitchell, MD Taking Active Self  clopidogrel (PLAVIX) 75 MG tablet [id number] Yes Take 1 tablet (75 mg total) by mouth daily.  Taking Active   docusate sodium (COLACE) 100 MG capsule [id number] Yes Take 1 capsule (100 mg total) by mouth daily. Bary Leriche, PA-C Taking Active   gabapentin (NEURONTIN) 300 MG capsule [id number] No Take 1 capsule (300 mg total) by mouth 2 (two) times daily.  Patient not taking: Reported on 02/18/2021   Bary Leriche, PA-C Not Taking Active   gabapentin (NEURONTIN) 400 MG capsule [id number] Yes Take 1 capsule (400 mg total) by mouth 2 (two) times daily. Bary Leriche, PA-C Taking Active   gabapentin (NEURONTIN) 400 MG capsule [id number] Yes Take 1 capsule (400 mg total) by mouth at bedtime AND 2 capsules at bedtime  Taking Active    guaiFENesin-dextromethorphan (ROBITUSSIN DM) 100-10 MG/5ML syrup [id number] Yes Take 15 mLs by mouth every 4 (four) hours as needed for cough. Terrilee Croak, MD Taking Active   hydrOXYzine (ATARAX) 25 MG tablet [id number] Yes Take 1 tablet (25 mg total) by mouth at bedtime for anxiety  Taking Active   hydrOXYzine (ATARAX/VISTARIL) 25 MG tablet [id number] Yes Take 1 tablet (25 mg total) by mouth at bedtime as needed for anxiety. Bary Leriche, PA-C Taking Active   lactose free nutrition (BOOST) LIQD [id number]  Take 237 mLs by mouth 2 (two) times daily between meals. [provider]  Active Self  methocarbamol (ROBAXIN) 500 MG tablet [id number] Yes Take 1 tablet (500 mg total) by mouth every 6 (six) hours as needed for muscle spasms. Bary Leriche, PA-C Taking Active   methocarbamol (ROBAXIN) 500 MG tablet [id number] Yes Take 1 tablet (500 mg total) by mouth every 6 (six) hours as needed for muscles spasms  Taking Active   Multiple Vitamin (MULTIVITAMIN WITH MINERALS) TABS tablet [id number] Yes Take 1 tablet by mouth daily. Terrilee Croak, MD Taking Active   nicotine (NICODERM CQ - DOSED IN MG/24 HOURS) 14 mg/24hr patch [id number] Yes Place 1 patch (14 mg total) onto the skin daily for smoking  Taking Active   nicotine (NICODERM CQ - DOSED IN MG/24 HOURS) 21 mg/24hr patch [id number] Yes Place 1 patch (21 mg total) onto the skin daily. Bary Leriche, PA-C Taking Active   nicotine (NICODERM CQ - DOSED IN MG/24 HR) 7 mg/24hr patch [id number] Yes Place 1 patch (7 mg total) onto the skin daily for 14 days and then stop  Taking Active   oxyCODONE-acetaminophen (PERCOCET/ROXICET) 5-325 MG tablet [id number] Yes Take 1-2 tablets by mouth every 8 (eight) hours as needed for severe pain. Bary Leriche, PA-C Taking Active   pantoprazole (PROTONIX) 40 MG tablet [id number] Yes Take 1 tablet (40 mg total) by mouth daily. Bary Leriche, PA-C Taking Active   pantoprazole (PROTONIX) 40 MG tablet [id number] Yes Take 1  tablet (40 mg total) by mouth daily.  Taking Active   senna (SENOKOT) 8.6 MG TABS tablet [id number] Yes Take 1 tablet (8.6 mg total) by mouth 2 (two) times daily. Bary Leriche, PA-C Taking Active   traMADol (ULTRAM) 50 MG tablet [id number] Yes Take 1 tablet (50 mg total) by mouth every 12 (twelve) hours as needed for moderate pain. Bary Leriche, PA-C Taking Active   traZODone (DESYREL) 50 MG tablet [id number] Yes Take 0.5-1 tablets (25-50 mg total) by mouth at bedtime as needed for sleep. Bary Leriche, PA-C Taking Active   traZODone (DESYREL) 50 MG tablet [id number] Yes Take 1 tablet (50 mg total) by mouth at bedtime as needed for sleep  Taking Active   Zinc Sulfate 220 (50 Zn) MG TABS [id number] Yes Take 1 tablet (220 mg total) by mouth daily. Bary Leriche, PA-C Taking Active            Med Note [facility], Gwenette Greet A   Thu Feb 18, 2021 10:08 AM) Needs to get.  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   PAD (peripheral artery disease) ([place]) 11/13/2019   Callus of foot 07/01/2019   Intermittent claudication ([place]) 01/20/2017   Need for influenza vaccination 01/20/2017   Needs smoking cessation education 09/06/2016   Abnormal echocardiogram 09/05/2016   Abnormal EKG 07/25/2016   Preoperative cardiovascular examination 07/25/2016   Shortness of breath on exertion 07/25/2016   Dyslipidemia, goal LDL below 70 07/25/2016   High blood pressure 07/25/2016   Abnormal chest x-ray 07/04/2016   Peripheral vascular disease ([place]) 07/04/2016    Decreased pedal pulses 06/24/2016   Varicose vein of leg 06/24/2016   Heavy smoker 06/24/2016   Abnormal weight loss 06/24/2016   Foot pain, left 06/24/2016   Screening for prostate cancer 06/24/2016    Conditions to be addressed/monitored per PCP order:  HTN and PAD with status post left AKA  Care Plan : RN Care Manger Plan of Care  Updates made by Inge Rise, RN since 02/18/2021 12:00 AM     Problem: Chronic Disease Management and Care Coordination Needs for HTN, PAD with status post left AKA   Priority: High     Long-Range Goal: Development of Plan of Care for Chronic Disease Management and Care Coordination Needs (HTN, PAD with status post left AKA)   Start Date: 02/18/2021  Expected End Date: 06/18/2021  Priority: High  Note:   Current Barriers:  Knowledge Deficits related to plan of care for management of HTN and PAD with status post left AKA on 10/04/2020  Care Coordination needs related to Financial constraints related to obtaining medications and rent, Medication procurement, and ADL IADL limitations  Chronic Disease Management support and education needs related to HTN and PAD with status post left AKA Financial Constraints.  Difficulty obtaining medications  RNCM Clinical Goal(s):  Patient will verbalize understanding of plan for management of HTN and PAD with status post left AKA as evidenced by improved management of these chronic diseases/ verbalize basic understanding of HTN and PAD with status post left AKA disease process and self health management plan as evidenced by improved management of these chronic diseases. take all medications exactly as prescribed and will call provider for medication related questions as evidenced by being compliant with all medications    attend all scheduled medical appointments: Follow up appointments in January 2022 as evidenced by attending all scheduled appointments        demonstrate improved adherence to prescribed  treatment plan for HTN and PAD with status post left AKA as evidenced by blood pressure readings within normal limits and proper healing of left AKA. demonstrate improved health management independence as evidenced by blood pressure readings within normal limits and proper healing of left AKA.        continue to work with Consulting civil engineer and/or Social Worker to address care management and care coordination needs related to HTN and PAD with status post left AKA as evidenced by adherence to CM Team Scheduled appointments     work with pharmacist to address complex medication regimen related to HTN and PAD with status post left  AKA as evidenced by review of EMR and patient or pharmacist report    work with Education officer, museum to address Lacks knowledge of community resource: for Dental Care covered under [employer] related to the management of HTN and PAD with status post left AKA as evidenced by review of EMR and patient or Education officer, museum report     through collaboration with Consulting civil engineer, provider, and care team.   Interventions: Inter-disciplinary care team collaboration (see longitudinal plan of care) Evaluation of current treatment plan related to  self management and patient's adherence to plan as established by provider   PAD with status post left AKA on 10/04/2020 and revision on 12/08/2020  (Status: New goal.) Long Term Goal  Evaluation of current treatment plan related to  PAD with status post left AKA , Financial constraints related to obtaining medications (zinc tablets for AKA healing) and rent, Medication procurement, Inability to perform IADL's independently, and Lacks knowledge of community resource: for dental care services covered [employer],  self-management and patient's adherence to plan as established by provider. Discussed plans with patient for ongoing care management follow up and provided patient with direct contact information for care management  team Reviewed medications with patient and discussed importance of medication compliance and benefits of zinc & Vitamin C for healing of left AKA; Reviewed scheduled/upcoming provider appointments including follow up appointments in January 2023; Social Work referral for information regarding dental services covered under [employer] ; Pharmacy referral for complex medication regimen; Screening for signs and symptoms of depression related to chronic disease state;  Assessed social determinant of health barriers;   Hypertension: (Status: New goal.) Long Term Goal  Last practice recorded BP readings:  BP Readings from Last 3 Encounters:  01/21/21 (!) 152/93  12/25/20 (!) 139/103  12/15/20 119/70  Most recent eGFR/CrCl:  Lab Results  Component Value Date   EGFR 101 07/22/2020    No components found for: CRCL  Evaluation of current treatment plan related to hypertension self management and patient's adherence to plan as established by provider;   Reviewed medications with patient and discussed importance of compliance;  Provided assistance with obtaining home blood pressure monitor via request PCP send order to [place] for a blood pressure monitor;  Discussed plans with patient for ongoing care management follow up and provided patient with direct contact information for care management team; Reviewed scheduled/upcoming provider appointments including:  Screening for signs and symptoms of depression related to chronic disease state;  Assessed social determinant of health barriers;   Patient Goals/Self-Care Activities: Take medications as prescribed   Attend all scheduled provider appointments Call pharmacy for medication refills 3-7 days in advance of running out of medications Call provider office for new concerns or questions  Work with the social worker to address care coordination needs and will continue to work with the clinical team to address health care and  disease management related needs Work with PCP to obtain a blood pressure monitor       Follow Up:  Patient agrees to Care Plan and Follow-up.  Plan: The Managed Medicaid care management team will reach out to the patient again over the next 30 days.  Date/time of next scheduled RN care management/care coordination outreach:  03/18/2020 at 11:00 AM  [place] [place] Network Mobile: [phone]

## 2021-02-18 NOTE — Patient Instructions (Signed)
Visit Information  [name] was given information about Medicaid Managed Care team care coordination services as a part of their Healthy [facility] Medicaid benefit. [name] verbally consented to engagement with the [facility] Managed Care team.   If you are experiencing a medical emergency, please call 911 or report to your local emergency department or urgent care.   If you have a non-emergency medical problem during routine business hours, please contact your provider's office and ask to speak with a nurse.   For questions related to your Healthy [facility] health plan, please call: [phone] or visit the homepage here: [email or web address]  If you would like to schedule transportation through your Healthy [facility] plan, please call the following number at least 2 days in advance of your appointment: [phone]  Call the [place] at [phone], at any time, 24 hours a day, 7 days a week. If you are in danger or need immediate medical attention call 911.  If you would like help to quit smoking, call 1-800-QUIT-NOW 610-847-8215) OR Espaol: 1-855-Djelo-Ya (4-259-563-8756) o para ms informacin haga clic aqu or Text READY to 200-400 to register via text  [name] - following are the goals we discussed in your visit today:   Patient Goals Addressed today are stated in [place] below.   Please see education materials related to today's visit provided as print materials.   The patient verbalized understanding of instructions provided today and agreed to receive a mailed copy of patient instruction and/or educational materials.  The Managed Medicaid care management team will reach out to the patient again over the next 30 days.   [name] RN, BSN Community Care Coordinator [place] Network Mobile: [phone]   Following is a copy of your plan of care:  Care  Plan : [place] of Care  Updates made by Inge Rise, RN since 02/18/2021 12:00 AM     Problem: Chronic Disease Management and Care Coordination Needs for HTN, PAD with status post left AKA   Priority: High     Long-Range Goal: Development of Plan of Care for Chronic Disease Management and Care Coordination Needs (HTN, PAD with status post left AKA)   Start Date: 02/18/2021  Expected End Date: 06/18/2021  Priority: High  Note:   Current Barriers:  Knowledge Deficits related to plan of care for management of HTN and PAD with status post left AKA on 10/04/2020  Care Coordination needs related to Financial constraints related to obtaining medications and rent, Medication procurement, and ADL IADL limitations  Chronic Disease Management support and education needs related to HTN and PAD with status post left AKA Financial Constraints.  Difficulty obtaining medications  RNCM Clinical Goal(s):  Patient will verbalize understanding of plan for management of HTN and PAD with status post left AKA as evidenced by improved management of these chronic diseases/ verbalize basic understanding of HTN and PAD with status post left AKA disease process and self health management plan as evidenced by improved management of these chronic diseases. take all medications exactly as prescribed and will call provider for medication related questions as evidenced by being compliant with all medications    attend all scheduled medical appointments: Follow up appointments in January 2022 as evidenced by attending all scheduled appointments        demonstrate improved adherence to prescribed treatment plan for HTN and PAD with status post left AKA as evidenced by blood  pressure readings within normal limits and proper healing of left AKA. demonstrate improved health management independence as evidenced by blood pressure readings within normal limits and proper healing of left AKA.        continue to  work with Consulting civil engineer and/or Social Worker to address care management and care coordination needs related to HTN and PAD with status post left AKA as evidenced by adherence to CM Team Scheduled appointments     work with pharmacist to address complex medication regimen related to HTN and PAD with status post left AKA as evidenced by review of EMR and patient or pharmacist report    work with Education officer, museum to address Lacks knowledge of community resource: for Dental Care covered under [employer] related to the management of HTN and PAD with status post left AKA as evidenced by review of EMR and patient or Education officer, museum report     through collaboration with Consulting civil engineer, provider, and care team.   Interventions: Inter-disciplinary care team collaboration (see longitudinal plan of care) Evaluation of current treatment plan related to  self management and patient's adherence to plan as established by provider   PAD with status post left AKA on 10/04/2020 and revision on 12/08/2020  (Status: New goal.) Long Term Goal  Evaluation of current treatment plan related to  PAD with status post left AKA , Financial constraints related to obtaining medications (zinc tablets for AKA healing) and rent, Medication procurement, Inability to perform IADL's independently, and Lacks knowledge of community resource: for dental care services covered [employer],  self-management and patient's adherence to plan as established by provider. Discussed plans with patient for ongoing care management follow up and provided patient with direct contact information for care management team Reviewed medications with patient and discussed importance of medication compliance and benefits of zinc & Vitamin C for healing of left AKA; Reviewed scheduled/upcoming provider appointments including follow up appointments in January 2023; Social Work referral for information regarding dental services covered under  [employer] ; Pharmacy referral for complex medication regimen; Screening for signs and symptoms of depression related to chronic disease state;  Assessed social determinant of health barriers;   Hypertension: (Status: New goal.) Long Term Goal  Last practice recorded BP readings:  BP Readings from Last 3 Encounters:  01/21/21 (!) 152/93  12/25/20 (!) 139/103  12/15/20 119/70  Most recent eGFR/CrCl:  Lab Results  Component Value Date   EGFR 101 07/22/2020    No components found for: CRCL  Evaluation of current treatment plan related to hypertension self management and patient's adherence to plan as established by provider;   Reviewed medications with patient and discussed importance of compliance;  Provided assistance with obtaining home blood pressure monitor via request PCP send order to [place] for a blood pressure monitor;  Discussed plans with patient for ongoing care management follow up and provided patient with direct contact information for care management team; Reviewed scheduled/upcoming provider appointments including:  Screening for signs and symptoms of depression related to chronic disease state;  Assessed social determinant of health barriers;   Patient Goals/Self-Care Activities: Take medications as prescribed   Attend all scheduled provider appointments Call pharmacy for medication refills 3-7 days in advance of running out of medications Call provider office for new concerns or questions  Work with the social worker to address care coordination needs and will continue to work with the clinical team to address health care and disease management  related needs Work with PCP to obtain a blood pressure monitor

## 2021-02-19 ENCOUNTER — Telehealth: Payer: Self-pay | Admitting: Licensed Clinical Social Worker

## 2021-02-19 NOTE — Telephone Encounter (Signed)
LCSW sent [facility] SW team an email letting them know of my involvement w/ pt and pt family.  I remain available for additional support. Appreciate [facility] involvement. Mailed pt a flyer for [employer] at [employer].  [name], MSW, LCSW Clinical Social Worker II [facility] Navigation  [phone]- work cell phone (preferred) [phone]- desk phone

## 2021-02-23 ENCOUNTER — Telehealth: Payer: Self-pay | Admitting: Licensed Clinical Social Worker

## 2021-02-23 NOTE — Telephone Encounter (Signed)
Pt sister had called this writer Monday 1/2 while clinic was closed.  She was inquiring about if pt has an appointment 1/4. Confirmed he does w/ PCP.  Pt stating he wants to drive to appt. I advised that until he sees PCP or PM&R teams that I did not feel I could advise pt that was safe. I asked if I could arrange him a ride through [employer] since he is out of window to arrange [employer] ride. I shared that I would mail pt his upcoming appointments and the [employer] transportation number. I stressed that if he should need a ride that he needs to call [employer] at least 48 hours before appointments. I have mailed him that information as discussed.   [name], MSW, LCSW Clinical Social Worker II [facility] Navigation  [phone]- work cell phone (preferred) [phone]- desk phone

## 2021-02-24 ENCOUNTER — Other Ambulatory Visit: Payer: Self-pay

## 2021-02-24 ENCOUNTER — Encounter: Payer: Self-pay | Admitting: Nurse Practitioner

## 2021-02-24 ENCOUNTER — Ambulatory Visit: Payer: Medicaid Other | Admitting: Nurse Practitioner

## 2021-02-24 VITALS — BP 120/62 | HR 98 | Temp 98.2°F

## 2021-02-24 DIAGNOSIS — I119 Hypertensive heart disease without heart failure: Secondary | ICD-10-CM | POA: Diagnosis not present

## 2021-02-24 DIAGNOSIS — E782 Mixed hyperlipidemia: Secondary | ICD-10-CM

## 2021-02-24 DIAGNOSIS — I739 Peripheral vascular disease, unspecified: Secondary | ICD-10-CM | POA: Diagnosis not present

## 2021-02-24 DIAGNOSIS — R7309 Other abnormal glucose: Secondary | ICD-10-CM

## 2021-02-24 DIAGNOSIS — Z89612 Acquired absence of left leg above knee: Secondary | ICD-10-CM

## 2021-02-24 DIAGNOSIS — I10 Essential (primary) hypertension: Secondary | ICD-10-CM

## 2021-02-24 NOTE — Patient Instructions (Signed)
Living With an Amputation An amputation is a surgical procedure to remove all or part of an arm or leg (limb). After this surgery, it will take time for you to heal and get used to living with the amputation. There are things you can do to help yourself adjust. Living with an amputation can be challenging, but it is often possible to do all of the activities that you used to do. How to manage lifestyle changes   Return to your normal activities as told by your health care provider. Ask your health care provider what activities are safe for you. You may choose to have an artificial limb (prosthesis) made for you. Use and care for your prosthesis as told by your health care provider. Discuss your leisure interests with your health care provider and prosthesis specialist. Equipment changes can often be made, which may allow you to return to a sport or hobby. Some Production manager for this purpose. Talk with your health care team about returning to work. When you are ready to return to work, your therapists can evaluate your job site and make recommendations to help you do your job. If you are not able to return to the same job, Futures trader of [employer]oc[employer] can help you with job retraining. Talk with your health care provider about returning to driving. You may: Work with an occupational therapist to learn new ways to safely drive with an amputation. Work with a prosthesis specialist or physical therapist to identify assistive devices for safe driving. Challenges of living with an amputation Some common challenges of living with an amputation may cause stress. These issues are normal. They include: Changes in how you move around (mobility). Changes in how you care for yourself. Changes in how you participate in leisure activities. Emotions after the amputation, such as grief and anger. Issues with body image and weight. Problems with pain and treatment. How to  manage stress Use these strategies to ease stress related to the daily challenges of living with an amputation: Be aware of any sources of your stress. Monitor yourself for symptoms of stress. Identify what challenges cause the most stress for you. Rethink the situation. Try to: Think realistically about stressful challenges. Do not ignore these challenges. Do not overreact to them. Understand what you would like to change about the situation and how you can set goals in that direction. Connect with other people who have gone through the same experience. Find ways to help relax your body and mind. These may include: Meditation, deep breathing, or progressive relaxation techniques. Hypnosis or guided imagery. Yoga or tai chi. Biofeedback or mindfulness techniques. Keeping a journal. Doing hobbies, like listening to music or being out in nature. Exercise. Talk with your rehabilitation team about the best way to get 30 minutes of physical activity at least 5 days a week. Where to find support: Talking to others Look for a local or online support group for people living with an amputation. Talk about your emotional challenges, such as grief, with a mental health professional. Omelia [place] Talk to your insurance company about what assistive devices your plan covers. Contact national or local amputee charities to find out if you are eligible for scholarships and medical equipment for people in need. People in [employer] service may be eligible for financial assistance or programs that support amputees. Rehabilitation A rehabilitation program can help you regain mobility and independence. Your rehabilitation team may include: Physicians and nurses. Physical and occupational therapists. Prosthesis specialists.  Social workers. Mental health professionals. Diet and nutrition specialists. Follow these instructions at home: Managing pain Work with your health care provider to manage any residual pain  or pain in a body part that no longer exists (phantom limb pain). This may include: Pain medicine. Physical therapy. Techniques that help retrain the brain and nervous system (movement representation techniques). Sensory discrimination training. For this treatment, stimulation is applied to different parts of your stump, and you describe what you feel. Biofeedback. This involves using monitors that alert you to changes in your breathing, heart rate, skin temperature, or muscle activity, and using relaxation techniques to reverse those changes. Acupuncture. This involves inserting small needles into certain places on your skin to help relieve pain. Eating and drinking Work with a dietitian to develop an eating plan that helps you maintain a healthy body weight. Your plan may include a balance of: Fresh fruits and vegetables. Whole grains. Lean meats. Low-fat dairy. Healthy fats, such as fish, nuts, avocado, or olive oil. Do not drink alcohol to cope with stress. General instructions Take over-the-counter and prescription medicines only as told by your health care provider. Do not use any products that contain nicotine or tobacco. These products include cigarettes, chewing tobacco, and vaping devices, such as e-cigarettes. If you need help quitting, ask your health care provider. Keep all follow-up visits. This is important. Where to find more information You can find more information about living with an amputation from: Amputee Coalition: www.amputee-coalition.org Amputee Support Groups: amputee.supportgroups.[facility] on Health, Physical Activity and Disability: www.nchpad.org Move [place]: move[place].org Contact a health care provider if: Your pain does not improve with medicine or treatment. You have trouble managing your emotions about losing an extremity. Get help right away if: You have severe pain. You have thoughts of harming yourself or others. If you ever feel  like you may hurt yourself or others, or have thoughts about taking your own life, get help right away. Go to your nearest emergency department or: Call your local emergency services (911 in the [place] Call a suicide crisis helpline, such as the [place] at (820) 728-8854 or 988 in the [place]. This is open 24 hours a day in the [place] Text the Crisis Text Line at 508-686-3718 (in the [place].). Summary It will take time for you to heal and get used to living with an amputation. Look for a local or online support group for people living with an amputation. Work with your health care provider to manage any phantom limb pain. This information is not intended to replace advice given to you by your health care provider. Make sure you discuss any questions you have with your health care provider. Document Revised: 09/02/2020 Document Reviewed: 06/25/2020 Elsevier Patient Education  [place].

## 2021-02-24 NOTE — Progress Notes (Signed)
I,Tianna Badgett,acting as a Education administrator for [employer], NP.,have documented all relevant documentation on the behalf of [employer], NP,as directed by  Bary Castilla, NP while in the presence of Bary Castilla, NP.  This visit occurred during the SARS-CoV-2 public health emergency.  Safety protocols were in place, including screening questions prior to the visit, additional usage of staff PPE, and extensive cleaning of exam room while observing appropriate contact time as indicated for disinfecting solutions.  Subjective:     Patient ID: Benjamin [name] , male    DOB: 12/12/1960 , 61 y.o.   MRN: [id number]   Chief Complaint  Patient presents with   Hospitalization Follow-up    HPI  Patient is here for hospital follow up. He had an infection in the toe, and the infection went up to his leg. He worked in [employer]. He was a smoker. 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 He is living with his sister home now. He is on wheelchair.  He sees a Hydrographic surveyor in [place].  He goes to health wellness.  He is not having any pan but he is ready to get a prostatic leg.  He quit smoking after his amputation. He does use a nicotine patch.     Past Medical History:  Diagnosis Date   Back pain    Heavy smoker    Hyperlipidemia LDL goal <70    Hypertension    Peripheral arterial occlusive disease ([place]) 06/2013   Bilateral femoral artery disease     Family History  Problem Relation Age of Onset   Arthritis Mother    COPD Father    Stroke Father    Multiple sclerosis Sister      Current Outpatient Medications:    acetaminophen (TYLENOL) 325 MG tablet, Take 1-2 tablets (325-650 mg total) by mouth every 4 (four) hours as needed for mild pain., Disp: 200 tablet, Rfl: 0   ascorbic acid (VITAMIN C) 500 MG tablet, Take 1 tablet (500 mg total) by mouth daily., Disp: 30 tablet, Rfl: 0   atorvastatin (LIPITOR) 20 MG tablet, Take 1 tablet (20 mg total) by mouth  daily., Disp: 90 tablet, Rfl: 3   atorvastatin (LIPITOR) 20 MG tablet, Take 1 tablet (20 mg total) by mouth daily., Disp: 30 tablet, Rfl: 0   carvedilol (COREG) 6.25 MG tablet, Take 1 tablet (6.25 mg total) by mouth 2 (two) times daily with a meal., Disp: 60 tablet, Rfl: 0   carvedilol (COREG) 6.25 MG tablet, Take 1 tablet (6.25 mg total) by mouth 2 (two) times daily with a meal., Disp: 60 tablet, Rfl: 0   cloNIDine (CATAPRES - DOSED IN MG/24 HR) 0.1 mg/24hr patch, Place 1 patch (0.1 mg total) onto the skin once a week. On tuesdays, Disp: 4 patch, Rfl: 0   cloNIDine (CATAPRES - DOSED IN MG/24 HR) 0.1 mg/24hr patch, Place 1 patch (0.1 mg total) onto the skin once a week. (on Tuesday), Disp: 4 patch, Rfl: 0   clopidogrel (PLAVIX) 75 MG tablet, Take 1 tablet (75 mg total) by mouth daily., Disp: 90 tablet, Rfl: 3   clopidogrel (PLAVIX) 75 MG tablet, Take 1 tablet (75 mg total) by mouth daily., Disp: 30 tablet, Rfl: 0   docusate sodium (COLACE) 100 MG capsule, Take 1 capsule (100 mg total) by mouth daily., Disp: 30 capsule, Rfl: 0   gabapentin (NEURONTIN) 400 MG capsule, Take 1 capsule (400 mg total) by mouth 2 (two) times daily., Disp: ,  Rfl:    gabapentin (NEURONTIN) 400 MG capsule, Take 1 capsule (400 mg total) by mouth at bedtime AND 2 capsules at bedtime, Disp: 90 capsule, Rfl: 0   guaiFENesin-dextromethorphan (ROBITUSSIN DM) 100-10 MG/5ML syrup, Take 15 mLs by mouth every 4 (four) hours as needed for cough., Disp: 118 mL, Rfl: 0   hydrOXYzine (ATARAX) 25 MG tablet, Take 1 tablet (25 mg total) by mouth at bedtime for anxiety, Disp: 30 tablet, Rfl: 0   hydrOXYzine (ATARAX/VISTARIL) 25 MG tablet, Take 1 tablet (25 mg total) by mouth at bedtime as needed for anxiety., Disp: 30 tablet, Rfl: 0   lactose free nutrition (BOOST) LIQD, Take 237 mLs by mouth 2 (two) times daily between meals., Disp: , Rfl:    methocarbamol (ROBAXIN) 500 MG tablet, Take 1 tablet (500 mg total) by mouth every 6 (six) hours as  needed for muscle spasms., Disp: 30 tablet, Rfl: 0   methocarbamol (ROBAXIN) 500 MG tablet, Take 1 tablet (500 mg total) by mouth every 6 (six) hours as needed for muscles spasms, Disp: 120 tablet, Rfl: 0   Multiple Vitamin (MULTIVITAMIN WITH MINERALS) TABS tablet, Take 1 tablet by mouth daily., Disp: , Rfl:    oxyCODONE-acetaminophen (PERCOCET/ROXICET) 5-325 MG tablet, Take 1-2 tablets by mouth every 8 (eight) hours as needed for severe pain., Disp: 20 tablet, Rfl: 0   pantoprazole (PROTONIX) 40 MG tablet, Take 1 tablet (40 mg total) by mouth daily., Disp: 30 tablet, Rfl: 0   pantoprazole (PROTONIX) 40 MG tablet, Take 1 tablet (40 mg total) by mouth daily., Disp: 30 tablet, Rfl: 0   senna (SENOKOT) 8.6 MG TABS tablet, Take 1 tablet (8.6 mg total) by mouth 2 (two) times daily., Disp: 60 tablet, Rfl: 0   traMADol (ULTRAM) 50 MG tablet, Take 1 tablet (50 mg total) by mouth every 12 (twelve) hours as needed for moderate pain., Disp: 10 tablet, Rfl: 0   traZODone (DESYREL) 50 MG tablet, Take 0.5-1 tablets (25-50 mg total) by mouth at bedtime as needed for sleep., Disp: 30 tablet, Rfl: 0   traZODone (DESYREL) 50 MG tablet, Take 1 tablet (50 mg total) by mouth at bedtime as needed for sleep, Disp: 30 tablet, Rfl: 0   Zinc Sulfate 220 (50 Zn) MG TABS, Take 1 tablet (220 mg total) by mouth daily., Disp: 30 tablet, Rfl: 0   Allergies  Allergen Reactions   Aspirin Nausea And Vomiting   Garlic Other (See Comments)    sneezing     Review of Systems  Constitutional: Negative.  Negative for chills, fatigue and fever.  HENT:  Negative for congestion, rhinorrhea and sinus pressure.   Respiratory: Negative.  Negative for cough, shortness of breath and wheezing.   Cardiovascular: Negative.  Negative for chest pain and palpitations.  Gastrointestinal: Negative.   Endocrine: Negative for polydipsia, polyphagia and polyuria.  Genitourinary:  Negative for urgency.  Musculoskeletal:        Left BKA    Neurological:  Negative for weakness and headaches.    Today's Vitals   02/24/21 1100  BP: 120/62  Pulse: 98  Temp: 98.2 F (36.8 C)  TempSrc: Oral   There is no height or weight on file to calculate BMI.   Objective:  Physical Exam Constitutional:      Appearance: Normal appearance.  HENT:     Head: Normocephalic and atraumatic.  Cardiovascular:     Rate and Rhythm: Normal rate and regular rhythm.     Pulses: Normal pulses.  Heart sounds: Normal heart sounds. No murmur heard. Pulmonary:     Effort: Pulmonary effort is normal. No respiratory distress.     Breath sounds: Normal breath sounds. No wheezing.  Musculoskeletal:     Comments: BKA left leg   Skin:    General: Skin is warm and dry.     Capillary Refill: Capillary refill takes less than 2 seconds.  Neurological:     Mental Status: He is alert.        Assessment And Plan:     1. S/P AKA (above knee amputation), left ([place]) -he is doing well. He is living with his sister. He was in rehab for 2 weeks.  -he is doing ADLs  -Requires supervision to navigate a ramp.    2. PAD (peripheral artery disease) (HCC) - CMP14+EGFR - CBC no Diff -Followed by vascular  -Continue Lipitor, Plavix   3. Mixed hyperlipidemia -will check labs today  -Chronic, continue meds  - Lipid panel  4. Primary hypertension -Chronic, continue meds  - CMP14+EGFR - CBC no Diff  5. Abnormal glucose -will check labs and assess  -Advised patient to decrease intake of sugar and sweet drinks and food.  - Hemoglobin A1c   The patient was encouraged to call or send a message through [place] for any questions or concerns.   Follow up: if symptoms persist or do not get better.   Side effects and appropriate use of all the medication(s) were discussed with the patient today. Patient advised to use the medication(s) as directed by their healthcare provider. The patient was encouraged to read, review, and understand all associated  package inserts and contact our office with any questions or concerns. The patient accepts the risks of the treatment plan and had an opportunity to ask questions.   Patient was given opportunity to ask questions. Patient verbalized understanding of the plan and was able to repeat key elements of the plan. All questions were answered to their satisfaction.  Raman Daysia Vandenboom, DNP   I, Raman Auguste Tebbetts have reviewed all documentation for this visit. The documentation on 02/25/20 for the exam, diagnosis, procedures, and orders are all accurate and complete.    IF YOU HAVE BEEN REFERRED TO A SPECIALIST, IT MAY TAKE 1-2 WEEKS TO SCHEDULE/PROCESS THE REFERRAL. IF YOU HAVE NOT HEARD FROM US/SPECIALIST IN TWO WEEKS, PLEASE GIVE [name] A CALL AT [phone] X [phone].   THE PATIENT IS ENCOURAGED TO PRACTICE SOCIAL DISTANCING DUE TO THE COVID-19 PANDEMIC.

## 2021-02-25 ENCOUNTER — Other Ambulatory Visit: Payer: Self-pay

## 2021-02-25 ENCOUNTER — Encounter (HOSPITAL_BASED_OUTPATIENT_CLINIC_OR_DEPARTMENT_OTHER): Payer: Medicaid Other | Admitting: Internal Medicine

## 2021-02-25 LAB — CMP14+EGFR
ALT: 16 IU/L (ref 0–44)
AST: 17 IU/L (ref 0–40)
Albumin/Globulin Ratio: 1.7 (ref 1.2–2.2)
Albumin: 4.1 g/dL (ref 3.8–4.8)
Alkaline Phosphatase: 145 IU/L — ABNORMAL HIGH (ref 44–121)
BUN/Creatinine Ratio: 12 (ref 10–24)
BUN: 10 mg/dL (ref 8–27)
Bilirubin Total: 0.3 mg/dL (ref 0.0–1.2)
CO2: 21 mmol/L (ref 20–29)
Calcium: 9.2 mg/dL (ref 8.6–10.2)
Chloride: 105 mmol/L (ref 96–106)
Creatinine, Ser: 0.81 mg/dL (ref 0.76–1.27)
Globulin, Total: 2.4 g/dL (ref 1.5–4.5)
Glucose: 99 mg/dL (ref 70–99)
Potassium: 4.1 mmol/L (ref 3.5–5.2)
Sodium: 141 mmol/L (ref 134–144)
Total Protein: 6.5 g/dL (ref 6.0–8.5)
eGFR: 100 mL/min/{1.73_m2} (ref >59)

## 2021-02-25 LAB — CBC
Hematocrit: 33.3 % — ABNORMAL LOW (ref 37.5–51.0)
Hemoglobin: 10.8 g/dL — ABNORMAL LOW (ref 13.0–17.7)
MCH: 22.1 pg — ABNORMAL LOW (ref 26.6–33.0)
MCHC: 32.4 g/dL (ref 31.5–35.7)
MCV: 68 fL — ABNORMAL LOW (ref 79–97)
Platelets: 462 10*3/uL — ABNORMAL HIGH (ref 150–450)
RBC: 4.89 x10E6/uL (ref 4.14–5.80)
RDW: 16.2 % — ABNORMAL HIGH (ref 11.6–15.4)
WBC: 4.4 10*3/uL (ref 3.4–10.8)

## 2021-02-25 LAB — LIPID PANEL
Chol/HDL Ratio: 4 ratio (ref 0.0–5.0)
Cholesterol, Total: 193 mg/dL (ref 100–199)
HDL: 48 mg/dL (ref >39)
LDL Chol Calc (NIH): 118 mg/dL — ABNORMAL HIGH (ref 0–99)
Triglycerides: 152 mg/dL — ABNORMAL HIGH (ref 0–149)
VLDL Cholesterol Cal: 27 mg/dL (ref 5–40)

## 2021-02-25 LAB — HEMOGLOBIN A1C
Est. average glucose Bld gHb Est-mCnc: 128 mg/dL
Hgb A1c MFr Bld: 6.1 % — ABNORMAL HIGH (ref 4.8–5.6)

## 2021-02-25 NOTE — Patient Outreach (Signed)
Medicaid Managed Care Social Work Note  02/25/2021 Name:  Benjamin [name] MRN:  [id number] DOB:  [birth year]  Benjamin [name] is an 61 y.o. year old male who is a primary patient of [name], [place].  The [facility] Managed Care Coordination team was consulted for assistance with:  Community Resources   Mr. Watling was given information about Medicaid Managed Care Coordination team services today. Benjamin [name] Patient agreed to services and verbal consent obtained.  Engaged with patient  for by telephone forinitial visit in response to referral for case management and/or care coordination services.   Assessments/Interventions:  Review of past medical history, allergies, medications, health status, including review of consultants reports, laboratory and other test data, was performed as part of comprehensive evaluation and provision of chronic care management services.  SDOH: (Social Determinant of Health) assessments and interventions performed:  BSW contacted patient regarding Dental resources. Patient stated he would like dental resources in the area to be mailed to him. Patient stated that everything else was good and no other resources are needed at this time.   Advanced Directives Status:  Not addressed in this encounter.  Care Plan                 Allergies  Allergen Reactions   Aspirin Nausea And Vomiting   Garlic Other (See Comments)    sneezing    Medications Reviewed Today     Reviewed by Octavio Manns (Medical Assistant) on 02/24/21 at 1058  Med List Status: <None>   Medication Order Taking? Sig Documenting Provider Last Dose Status Informant  acetaminophen (TYLENOL) 325 MG tablet [id number] Yes Take 1-2 tablets (325-650 mg total) by mouth every 4 (four) hours as needed for mild pain. Bary Leriche, PA-C Taking Active   ascorbic acid (VITAMIN C) 500 MG tablet [id number] Yes Take 1 tablet (500 mg total) by mouth daily. Bary Leriche, PA-C Taking Active   atorvastatin (LIPITOR)  20 MG tablet [id number] Yes Take 1 tablet (20 mg total) by mouth daily. Serafina Mitchell, MD Taking Active Self  atorvastatin (LIPITOR) 20 MG tablet [id number] Yes Take 1 tablet (20 mg total) by mouth daily.  Taking Active   carvedilol (COREG) 6.25 MG tablet [id number] Yes Take 1 tablet (6.25 mg total) by mouth 2 (two) times daily with a meal. Love, Pamela S, PA-C Taking Active   carvedilol (COREG) 6.25 MG tablet [id number] Yes Take 1 tablet (6.25 mg total) by mouth 2 (two) times daily with a meal.  Taking Active   cloNIDine (CATAPRES - DOSED IN MG/24 HR) 0.1 mg/24hr patch [id number] Yes Place 1 patch (0.1 mg total) onto the skin once a week. On tuesdays Love, Pamela S, PA-C Taking Active   cloNIDine (CATAPRES - DOSED IN MG/24 HR) 0.1 mg/24hr patch [id number] Yes Place 1 patch (0.1 mg total) onto the skin once a week. (on Tuesday)  Taking Active   clopidogrel (PLAVIX) 75 MG tablet [id number] Yes Take 1 tablet (75 mg total) by mouth daily. Serafina Mitchell, MD Taking Active Self  clopidogrel (PLAVIX) 75 MG tablet [id number] Yes Take 1 tablet (75 mg total) by mouth daily.  Taking Active   docusate sodium (COLACE) 100 MG capsule [id number] Yes Take 1 capsule (100 mg total) by mouth daily. Bary Leriche, PA-C Taking Active   gabapentin (NEURONTIN) 400 MG capsule [id number] Yes Take 1 capsule (400 mg total) by mouth 2 (two) times daily. Bary Leriche, PA-C Taking Active   gabapentin (NEURONTIN) 400  MG capsule [id number] Yes Take 1 capsule (400 mg total) by mouth at bedtime AND 2 capsules at bedtime  Taking Active   guaiFENesin-dextromethorphan (ROBITUSSIN DM) 100-10 MG/5ML syrup [id number] Yes Take 15 mLs by mouth every 4 (four) hours as needed for cough. Terrilee Croak, MD Taking Active   hydrOXYzine (ATARAX) 25 MG tablet [id number] Yes Take 1 tablet (25 mg total) by mouth at bedtime for anxiety  Taking Active   hydrOXYzine (ATARAX/VISTARIL) 25 MG tablet [id number] Yes Take 1 tablet (25 mg total) by mouth at bedtime as  needed for anxiety. Bary Leriche, PA-C Taking Active   lactose free nutrition (BOOST) LIQD [id number] Yes Take 237 mLs by mouth 2 (two) times daily between meals. [provider] Taking Active Self  methocarbamol (ROBAXIN) 500 MG tablet [id number] Yes Take 1 tablet (500 mg total) by mouth every 6 (six) hours as needed for muscle spasms. Bary Leriche, PA-C Taking Active   methocarbamol (ROBAXIN) 500 MG tablet [id number] Yes Take 1 tablet (500 mg total) by mouth every 6 (six) hours as needed for muscles spasms  Taking Active   Multiple Vitamin (MULTIVITAMIN WITH MINERALS) TABS tablet [id number] Yes Take 1 tablet by mouth daily. Terrilee Croak, MD Taking Active   oxyCODONE-acetaminophen (PERCOCET/ROXICET) 5-325 MG tablet [id number] Yes Take 1-2 tablets by mouth every 8 (eight) hours as needed for severe pain. Bary Leriche, PA-C Taking Active   pantoprazole (PROTONIX) 40 MG tablet [id number] Yes Take 1 tablet (40 mg total) by mouth daily. Bary Leriche, PA-C Taking Active   pantoprazole (PROTONIX) 40 MG tablet [id number] Yes Take 1 tablet (40 mg total) by mouth daily.  Taking Active   senna (SENOKOT) 8.6 MG TABS tablet [id number] Yes Take 1 tablet (8.6 mg total) by mouth 2 (two) times daily. Bary Leriche, PA-C Taking Active   traMADol (ULTRAM) 50 MG tablet [id number] Yes Take 1 tablet (50 mg total) by mouth every 12 (twelve) hours as needed for moderate pain. Bary Leriche, PA-C Taking Active   traZODone (DESYREL) 50 MG tablet [id number] Yes Take 0.5-1 tablets (25-50 mg total) by mouth at bedtime as needed for sleep. Bary Leriche, PA-C Taking Active   traZODone (DESYREL) 50 MG tablet [id number] Yes Take 1 tablet (50 mg total) by mouth at bedtime as needed for sleep  Taking Active   Zinc Sulfate 220 (50 Zn) MG TABS [id number] Yes Take 1 tablet (220 mg total) by mouth daily. Bary Leriche, PA-C Taking Active            Med Note [facility], Albertine Patricia Feb 18, 2021 10:38 AM) Does not have  currently due to inability to pay for it.            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   PAD (peripheral artery disease) ([place]) 11/13/2019   Callus of foot 07/01/2019   Intermittent claudication ([place]) 01/20/2017   Need for influenza vaccination 01/20/2017   Needs smoking cessation education 09/06/2016   Abnormal echocardiogram 09/05/2016   Abnormal EKG 07/25/2016   Preoperative cardiovascular examination 07/25/2016  Shortness of breath on exertion 07/25/2016   Dyslipidemia, goal LDL below 70 07/25/2016   High blood pressure 07/25/2016   Abnormal chest x-ray 07/04/2016   Peripheral vascular disease ([place]) 07/04/2016   Decreased pedal pulses 06/24/2016   Varicose vein of leg 06/24/2016   Heavy smoker 06/24/2016   Abnormal weight loss 06/24/2016   Foot pain, left 06/24/2016   Screening for prostate cancer 06/24/2016    Conditions to be addressed/monitored per PCP order:   dental resources  Care Plan : [place] of Care  Updates made by Ethelda Chick since 02/25/2021 12:00 AM     Problem: Chronic Disease Management and Care Coordination Needs for HTN, PAD with status post left AKA   Priority: High     Long-Range Goal: Development of Plan of Care for Chronic Disease Management and Care Coordination Needs (HTN, PAD with status post left AKA)   Start Date: 02/18/2021  Expected End Date: 06/18/2021  Priority: High  Note:   Current Barriers:  Knowledge Deficits related to plan of care for management of HTN and PAD with  status post left AKA on 10/04/2020  Care Coordination needs related to Financial constraints related to obtaining medications and rent, Medication procurement, and ADL IADL limitations  Chronic Disease Management support and education needs related to HTN and PAD with status post left AKA Financial Constraints.  Difficulty obtaining medications  RNCM Clinical Goal(s):  Patient will verbalize understanding of plan for management of HTN and PAD with status post left AKA as evidenced by improved management of these chronic diseases/ verbalize basic understanding of HTN and PAD with status post left AKA disease process and self health management plan as evidenced by improved management of these chronic diseases. take all medications exactly as prescribed and will call provider for medication related questions as evidenced by being compliant with all medications    attend all scheduled medical appointments: Follow up appointments in January 2022 as evidenced by attending all scheduled appointments        demonstrate improved adherence to prescribed treatment plan for HTN and PAD with status post left AKA as evidenced by blood pressure readings within normal limits and proper healing of left AKA. demonstrate improved health management independence as evidenced by blood pressure readings within normal limits and proper healing of left AKA.        continue to work with Consulting civil engineer and/or Social Worker to address care management and care coordination needs related to HTN and PAD with status post left AKA as evidenced by adherence to CM Team Scheduled appointments     work with pharmacist to address complex medication regimen related to HTN and PAD with status post left AKA as evidenced by review of EMR and patient or pharmacist report    work with Education officer, museum to address Lacks knowledge of community resource: for Dental Care covered under [employer] related to the management of HTN and PAD with  status post left AKA as evidenced by review of EMR and patient or Education officer, museum report     through collaboration with Consulting civil engineer, provider, and care team.   Interventions: Inter-disciplinary care team collaboration (see longitudinal plan of care) Evaluation of current treatment plan related to  self management and patient's adherence to plan as established by provider BSW contacted patient regarding Dental resources. Patient stated he would like dental resources in the area to be mailed to him. Patient stated that everything else was good and no  other resources are needed at this time.    PAD with status post left AKA on 10/04/2020 and revision on 12/08/2020  (Status: New goal.) Long Term Goal  Evaluation of current treatment plan related to  PAD with status post left AKA , Financial constraints related to obtaining medications (zinc tablets for AKA healing) and rent, Medication procurement, Inability to perform IADL's independently, and Lacks knowledge of community resource: for dental care services covered [employer],  self-management and patient's adherence to plan as established by provider. Discussed plans with patient for ongoing care management follow up and provided patient with direct contact information for care management team Reviewed medications with patient and discussed importance of medication compliance and benefits of zinc & Vitamin C for healing of left AKA; Reviewed scheduled/upcoming provider appointments including follow up appointments in January 2023; Social Work referral for information regarding dental services covered under [employer] ; Pharmacy referral for complex medication regimen; Screening for signs and symptoms of depression related to chronic disease state;  Assessed social determinant of health barriers;   Hypertension: (Status: New goal.) Long Term Goal  Last practice recorded BP readings:  BP Readings from Last 3 Encounters:   01/21/21 (!) 152/93  12/25/20 (!) 139/103  12/15/20 119/70  Most recent eGFR/CrCl:  Lab Results  Component Value Date   EGFR 101 07/22/2020    No components found for: CRCL  Evaluation of current treatment plan related to hypertension self management and patient's adherence to plan as established by provider;   Reviewed medications with patient and discussed importance of compliance;  Provided assistance with obtaining home blood pressure monitor via request PCP send order to [place] for a blood pressure monitor;  Discussed plans with patient for ongoing care management follow up and provided patient with direct contact information for care management team; Reviewed scheduled/upcoming provider appointments including:  Screening for signs and symptoms of depression related to chronic disease state;  Assessed social determinant of health barriers;   Patient Goals/Self-Care Activities: Take medications as prescribed   Attend all scheduled provider appointments Call pharmacy for medication refills 3-7 days in advance of running out of medications Call provider office for new concerns or questions  Work with the social worker to address care coordination needs and will continue to work with the clinical team to address health care and disease management related needs Work with PCP to obtain a blood pressure monitor       Follow up:  Patient agrees to Care Plan and Follow-up.  Plan: The Managed Medicaid care management team will reach out to the patient again over the next 30 days.  Date/time of next scheduled Social Work care management/care coordination outreac2/6/23  Mickel Fuchs, [place], [place] Medicaid Team  [phone]

## 2021-02-25 NOTE — Patient Instructions (Signed)
Visit Information  Benjamin [name] was given information about Medicaid Managed Care team care coordination services as a part of their Healthy [facility] Medicaid benefit. Benjamin [name] verbally consented to engagement with the [facility] Managed Care team.   If you are experiencing a medical emergency, please call 911 or report to your local emergency department or urgent care.   If you have a non-emergency medical problem during routine business hours, please contact your provider's office and ask to speak with a nurse.   For questions related to your Healthy [facility] health plan, please call: [phone] or visit the homepage here: [email or web address]  If you would like to schedule transportation through your Healthy [facility] plan, please call the following number at least 2 days in advance of your appointment: [phone]  Call the [place] at [phone], at any time, 24 hours a day, 7 days a week. If you are in danger or need immediate medical attention call 911.  If you would like help to quit smoking, call 1-800-QUIT-NOW 514-193-0459) OR Espaol: 1-855-Djelo-Ya (2-993-716-9678) o para ms informacin haga clic aqu or Text READY to 200-400 to register via text  [name] - following are the goals we discussed in your visit today:   Goals Addressed   None     Social Worker will follow up with patient in 30 days .   Benjamin [name], Benjamin [name], [place]  High Risk Managed Medicaid Team  [phone]   Following is a copy of your plan of care:  Care Plan : [place] of Care  Updates made by Ethelda Chick since 02/25/2021 12:00 AM     Problem: Chronic Disease Management and Care Coordination Needs for HTN, PAD with status post left AKA   Priority: High     Long-Range Goal: Development of Plan of Care for Chronic Disease Management and Care Coordination Needs (HTN, PAD with  status post left AKA)   Start Date: 02/18/2021  Expected End Date: 06/18/2021  Priority: High  Note:   Current Barriers:  Knowledge Deficits related to plan of care for management of HTN and PAD with status post left AKA on 10/04/2020  Care Coordination needs related to Financial constraints related to obtaining medications and rent, Medication procurement, and ADL IADL limitations  Chronic Disease Management support and education needs related to HTN and PAD with status post left AKA Financial Constraints.  Difficulty obtaining medications  RNCM Clinical Goal(s):  Patient will verbalize understanding of plan for management of HTN and PAD with status post left AKA as evidenced by improved management of these chronic diseases/ verbalize basic understanding of HTN and PAD with status post left AKA disease process and self health management plan as evidenced by improved management of these chronic diseases. take all medications exactly as prescribed and will call provider for medication related questions as evidenced by being compliant with all medications    attend all scheduled medical appointments: Follow up appointments in January 2022 as evidenced by attending all scheduled appointments        demonstrate improved adherence to prescribed treatment plan for HTN and PAD with status post left AKA as evidenced by blood pressure readings within normal limits and proper healing of left AKA. demonstrate improved health management independence as evidenced by blood pressure readings within normal limits and proper healing of left AKA.        continue to work with Consulting civil engineer and/or Social  Worker to address care management and care coordination needs related to HTN and PAD with status post left AKA as evidenced by adherence to CM Team Scheduled appointments     work with pharmacist to address complex medication regimen related to HTN and PAD with status post left AKA as evidenced by review of EMR and  patient or pharmacist report    work with Education officer, museum to address Lacks knowledge of community resource: for Dental Care covered under [employer] related to the management of HTN and PAD with status post left AKA as evidenced by review of EMR and patient or Education officer, museum report     through collaboration with Consulting civil engineer, provider, and care team.   Interventions: Inter-disciplinary care team collaboration (see longitudinal plan of care) Evaluation of current treatment plan related to  self management and patient's adherence to plan as established by provider Benjamin [name] contacted patient regarding Dental resources. Patient stated he would like dental resources in the area to be mailed to him. Patient stated that everything else was good and no other resources are needed at this time.    PAD with status post left AKA on 10/04/2020 and revision on 12/08/2020  (Status: New goal.) Long Term Goal  Evaluation of current treatment plan related to  PAD with status post left AKA , Financial constraints related to obtaining medications (zinc tablets for AKA healing) and rent, Medication procurement, Inability to perform IADL's independently, and Lacks knowledge of community resource: for dental care services covered [employer],  self-management and patient's adherence to plan as established by provider. Discussed plans with patient for ongoing care management follow up and provided patient with direct contact information for care management team Reviewed medications with patient and discussed importance of medication compliance and benefits of zinc & Vitamin C for healing of left AKA; Reviewed scheduled/upcoming provider appointments including follow up appointments in January 2023; Social Work referral for information regarding dental services covered under [employer] ; Pharmacy referral for complex medication regimen; Screening for signs and symptoms of depression related to  chronic disease state;  Assessed social determinant of health barriers;   Hypertension: (Status: New goal.) Long Term Goal  Last practice recorded BP readings:  BP Readings from Last 3 Encounters:  01/21/21 (!) 152/93  12/25/20 (!) 139/103  12/15/20 119/70  Most recent eGFR/CrCl:  Lab Results  Component Value Date   EGFR 101 07/22/2020    No components found for: CRCL  Evaluation of current treatment plan related to hypertension self management and patient's adherence to plan as established by provider;   Reviewed medications with patient and discussed importance of compliance;  Provided assistance with obtaining home blood pressure monitor via request PCP send order to [place] for a blood pressure monitor;  Discussed plans with patient for ongoing care management follow up and provided patient with direct contact information for care management team; Reviewed scheduled/upcoming provider appointments including:  Screening for signs and symptoms of depression related to chronic disease state;  Assessed social determinant of health barriers;   Patient Goals/Self-Care Activities: Take medications as prescribed   Attend all scheduled provider appointments Call pharmacy for medication refills 3-7 days in advance of running out of medications Call provider office for new concerns or questions  Work with the social worker to address care coordination needs and will continue to work with the clinical team to address health care and disease management related needs Work with PCP to obtain a blood  pressure monitor

## 2021-02-26 ENCOUNTER — Encounter (HOSPITAL_BASED_OUTPATIENT_CLINIC_OR_DEPARTMENT_OTHER): Payer: Medicaid Other | Admitting: Internal Medicine

## 2021-03-01 ENCOUNTER — Telehealth: Payer: Self-pay | Admitting: Licensed Clinical Social Worker

## 2021-03-01 NOTE — Telephone Encounter (Signed)
Received f/u from pt/pt sister. They are interested in [place] [facility] [place]. I have sent his contact information to Jamey Reas, PT, who facilitates group. Appreciate her assistance.   Westley Hummer, MSW, [place]  [phone]- work cell phone (preferred) [phone]- desk phone

## 2021-03-04 ENCOUNTER — Encounter: Payer: Medicaid Other | Admitting: Physical Medicine and Rehabilitation

## 2021-03-08 ENCOUNTER — Other Ambulatory Visit: Payer: Self-pay

## 2021-03-09 ENCOUNTER — Other Ambulatory Visit: Payer: Self-pay | Admitting: Nurse Practitioner

## 2021-03-09 ENCOUNTER — Other Ambulatory Visit: Payer: Self-pay

## 2021-03-09 DIAGNOSIS — R7303 Prediabetes: Secondary | ICD-10-CM

## 2021-03-09 MED ORDER — METFORMIN HCL 500 MG PO TABS
500.0000 mg | ORAL_TABLET | Freq: Two times a day (BID) | ORAL | 2 refills | Status: DC
  Filled 2021-03-09: qty 60, 30d supply, fill #0

## 2021-03-09 NOTE — Progress Notes (Signed)
Sent!

## 2021-03-10 ENCOUNTER — Telehealth: Payer: Self-pay | Admitting: Licensed Clinical Social Worker

## 2021-03-10 NOTE — Telephone Encounter (Signed)
Incoming call from pt this afternoon again regarding Amputee Support Group. I shared that I had reached out to facilitator Robin, PT. Inquired if pt had an email that could be used since that is primary way of coordination for group- he confirms he does not at this time. I again reached out to Robin via email to see if she could f/u with pt again if she has not yet already. Appreciate pt reaching out. Remain available.   Benjamin [name], MSW, LCSW Clinical Social Worker II [facility] Navigation  [phone]- work cell phone (preferred) [phone]- desk phone

## 2021-03-11 NOTE — Telephone Encounter (Signed)
Received update from Robin, PT, who coordinates amputee group. Pt was contacted, did not pick up phone and voicemail was full. I mentioned sometimes the home phone is the better contact and provided that number. Appreciate her assistance and attempts at reaching him.  [name], MSW, LCSW Clinical Social Worker II [facility] Navigation  [phone]- work cell phone (preferred) [phone]- desk phone

## 2021-03-15 ENCOUNTER — Other Ambulatory Visit: Payer: Self-pay

## 2021-03-16 ENCOUNTER — Other Ambulatory Visit: Payer: Self-pay

## 2021-03-16 ENCOUNTER — Encounter
Payer: Medicaid Other | Attending: Physical Medicine and Rehabilitation | Admitting: Physical Medicine and Rehabilitation

## 2021-03-16 VITALS — BP 141/89 | HR 89

## 2021-03-16 DIAGNOSIS — I10 Essential (primary) hypertension: Secondary | ICD-10-CM | POA: Diagnosis present

## 2021-03-16 DIAGNOSIS — G4701 Insomnia due to medical condition: Secondary | ICD-10-CM | POA: Diagnosis not present

## 2021-03-16 DIAGNOSIS — S78112A Complete traumatic amputation at level between left hip and knee, initial encounter: Secondary | ICD-10-CM

## 2021-03-16 DIAGNOSIS — I739 Peripheral vascular disease, unspecified: Secondary | ICD-10-CM | POA: Diagnosis present

## 2021-03-16 DIAGNOSIS — K5900 Constipation, unspecified: Secondary | ICD-10-CM

## 2021-03-16 DIAGNOSIS — E785 Hyperlipidemia, unspecified: Secondary | ICD-10-CM | POA: Diagnosis not present

## 2021-03-16 DIAGNOSIS — G546 Phantom limb syndrome with pain: Secondary | ICD-10-CM

## 2021-03-16 MED ORDER — ATORVASTATIN CALCIUM 20 MG PO TABS
20.0000 mg | ORAL_TABLET | Freq: Every day | ORAL | 3 refills | Status: DC
Start: 2021-03-16 — End: 2021-03-23

## 2021-03-16 MED ORDER — GABAPENTIN 600 MG PO TABS: 600.0000 mg | ORAL_TABLET | Freq: Two times a day (BID) | ORAL | 3 refills | Status: DC

## 2021-03-16 MED ORDER — CARVEDILOL 6.25 MG PO TABS: 6.2500 mg | ORAL_TABLET | Freq: Two times a day (BID) | ORAL | 0 refills | Status: DC

## 2021-03-16 MED ORDER — TRAZODONE HCL 50 MG PO TABS: 50.0000 mg | ORAL_TABLET | Freq: Every evening | ORAL | 0 refills | Status: DC | PRN

## 2021-03-16 MED ORDER — GABAPENTIN 400 MG PO CAPS
ORAL_CAPSULE | ORAL | 0 refills | Status: DC
  Filled 2021-03-16: qty 90, 30d supply, fill #0

## 2021-03-16 MED ORDER — DOCUSATE SODIUM 100 MG PO CAPS: 100.0000 mg | ORAL_CAPSULE | Freq: Every day | ORAL | 0 refills | Status: DC

## 2021-03-16 MED ORDER — GABAPENTIN 600 MG PO TABS
600.0000 mg | ORAL_TABLET | Freq: Two times a day (BID) | ORAL | 3 refills | Status: DC
  Filled 2021-03-16: qty 60, 30d supply, fill #0

## 2021-03-16 MED ORDER — LOSARTAN POTASSIUM 25 MG PO TABS: 25.0000 mg | ORAL_TABLET | Freq: Every day | ORAL | 3 refills | Status: DC

## 2021-03-16 NOTE — Progress Notes (Signed)
Subjective:    Patient ID: Benjamin [name], male    DOB: Jul 18, 1960, 61 y.o.   MRN: [id number]  HPI Benjamin [name] is a 61 year old man who presents for hospital follow-up after CIR admission for Left AKA.   1) Left AKA -he has had surgical follow-up and has repeat follow-up next month.  -he is eager to get his prosthesis so he can improve his mobility  -he is eager to begin PT and OT again to regain his strength -he is able to transfer to and from his bathroom on his right leg as wheelchair does not fit inside bathroom door -he is living with his sister  2) Left phantom limb pain -worst at night -gabapentin helps -he asks whether dose may be increased -currently takes 400mg  BID    Pain Inventory Average Pain 7 Pain Right Now 1 My pain is tingling  In the last 24 hours, has pain interfered with the following? General activity 1 Relation with others 0 Enjoyment of life 1 What TIME of day is your pain at its worst? night Sleep (in general) Fair  Pain is worse with: sitting Pain improves with: medication Relief from Meds: 8  use a walker how many minutes can you walk? 30 ability to climb steps?  yes do you drive?  no use a wheelchair transfers alone  disabled: date disabled . I need assistance with the following:  meal prep  numbness tingling trouble walking spasms anxiety  Hospital f/u  Hospital f/u    Family History  Problem Relation Age of Onset   Arthritis Mother    COPD Father    Stroke Father    Multiple sclerosis Sister    Social History   Socioeconomic History   Marital status: Single    Spouse name: Not on file   Number of children: Not on file   Years of education: 12   Highest education level: Not on file  Occupational History   Not on file  Tobacco Use   Smoking status: Former    Packs/day: 1.50    Years: 29.00    Pack years: 43.50    Types: Cigarettes    Quit date: 10/22/2020    Years since quitting: 0.3   Smokeless tobacco: Never    Tobacco comments:    Quit smoking September 2022.  Using Nicoderm patch currently.  Vaping Use   Vaping Use: Never used  Substance and Sexual Activity   Alcohol use: No   Drug use: No   Sexual activity: Not Currently  Other Topics Concern   Not on file  Social History Narrative   He works as a October 2022 at [employer].   He lives with his sister and her son.   Walks every day to work at least 15+ minutes.   Social Determinants of Health   Financial Resource Strain: Medium Risk   Difficulty of Paying Living Expenses: Somewhat hard  Food Insecurity: No Food Insecurity   Worried About [employer] in the Last Year: Never true   Ran Out of Food in the Last Year: Never true  Transportation Needs: No Transportation Needs   Lack of Transportation (Medical): No   Lack of Transportation (Non-Medical): No  Physical Activity: Inactive   Days of Exercise per Week: 0 days   Minutes of Exercise per Session: 0 min  Stress: Stress Concern Present   Feeling of Stress : To some extent  Social Connections: Unknown   Frequency of Communication  with Friends and Family: More than three times a week   Frequency of Social Gatherings with Friends and Family: More than three times a week   Attends Religious Services: Not on file   Active Member of Clubs or Organizations: Not on file   Attends BankerClub or Organization Meetings: Not on file   Marital Status: Never married   Past Surgical History:  Procedure Laterality Date   ABDOMINAL AORTOGRAM W/LOWER EXTREMITY N/A 07/12/2016   Procedure: Abdominal Aortogram w/Lower Extremity;  Surgeon: Nada LibmanBrabham, Vance W, MD;  [facility]: [facility];  Service: Cardiovascular;  Laterality: N/A;  Bilateral extermity: Patent Renal As. No sig Dz in infrarenal Abd Aorta. Normal Bilat Iliac arteries. R SFA is 100% @ origin - recon in AK-Pop A. R PT A patent. L CFA occluded. L PFA recon @ origin, L SFA occluded w/ recon in AK Pop A.   ABDOMINAL AORTOGRAM  W/LOWER EXTREMITY N/A 10/15/2019   Procedure: ABDOMINAL AORTOGRAM W/LOWER EXTREMITY;  Surgeon: Nada LibmanBrabham, Vance W, MD;  [facility]: [facility];  Service: Cardiovascular;  Laterality: N/A;   AMPUTATION Left 10/04/2020   Procedure: LEFT ABOVE KNEE AMPUTATION;  Surgeon: Nada LibmanBrabham, Vance W, MD;  [facility];  Service: Vascular;  Laterality: Left;   AMPUTATION Left 12/08/2020   Procedure: RIGHT ABOVE KNEE AMPUTATION REVISION;  Surgeon: Chuck Hintickson, Christopher S, MD;  [facility];  Service: Vascular;  Laterality: Left;   AORTA - BILATERAL FEMORAL ARTERY BYPASS GRAFT N/A 05/08/2020   Procedure: AORTA BIFEMORAL BYPASS GRAFT;  Surgeon: Nada LibmanBrabham, Vance W, MD;  [facility];  Service: Vascular;  Laterality: N/A;   COLONOSCOPY  06/2016   never   ENDARTERECTOMY FEMORAL Left 11/14/2019   Procedure: Left groin exploration, Redo left femoral artery exposure;  Surgeon: Nada LibmanBrabham, Vance W, MD;  [facility];  Service: Vascular;  Laterality: Left;   FEMORAL-FEMORAL BYPASS GRAFT N/A 11/13/2019   Procedure: BYPASS GRAFT FEMORAL-FEMORAL ARTERY RIGHT TO LEFT USING HEMASHIELD GOLD GRAFT 8mm x 30cm;  Surgeon: Nada LibmanBrabham, Vance W, MD;  [facility];  Service: Vascular;  Laterality: N/A;   FEMORAL-POPLITEAL BYPASS GRAFT Left 11/13/2019   Procedure: LEFT FEMORAL BELOW KNEE-POPLITEAL ARTERY USING NON-REVERSED GREATER SAPHENOUS VEIN;  Surgeon: Nada LibmanBrabham, Vance W, MD;  [facility];  Service: Vascular;  Laterality: Left;   FEMORAL-POPLITEAL BYPASS GRAFT Left 05/08/2020   Procedure: LEFT REDO BYPASS GRAFT COMMON FEMORAL- BELOW KNEE POPLITEAL ARTERY USING PROPATEN GRAFT;  Surgeon: Nada LibmanBrabham, Vance W, MD;  [facility];  Service: Vascular;  Laterality: Left;   I & D EXTREMITY Left 09/07/2020   Procedure: IRRIGATION AND DEBRIDEMENT LEFT LEG WITH EXCISION OF LEFT LEG DISTAL BYPASS GRAFT;  Surgeon: Cephus Shellinglark, Christopher J, MD;  [facility];  Service: Vascular;  Laterality: Left;   LOWER EXTREMITY ANGIOGRAM Left  11/14/2019   Procedure: LEFT LOWER EXTREMITY ANGIOGRAM, BYPASS GRAFT ANGIOPLASTY;  Surgeon: Nada LibmanBrabham, Vance W, MD;  [facility];  Service: Vascular;  Laterality: Left;   PERIPHERAL VASCULAR INTERVENTION  10/15/2019   Procedure: PERIPHERAL VASCULAR INTERVENTION;  Surgeon: Nada LibmanBrabham, Vance W, MD;  [facility];  Service: Cardiovascular;;  Rt Iliac   REMOVAL OF GRAFT Left 10/04/2020   Procedure: REMOVAL OF LEFT FEMORAL TO POPITEAL BYPASS GRAFT;  Surgeon: Nada LibmanBrabham, Vance W, MD;  [facility];  Service: Vascular;  Laterality: Left;   Past Medical History:  Diagnosis Date   Back pain    Heavy smoker    Hyperlipidemia LDL goal <70    Hypertension  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  Opioid Risk Score:   Fall Risk Score:  `1  Depression screen PHQ 2/9  Depression screen [facility] 2/9 03/16/2021 02/18/2021 07/22/2020 07/01/2019 05/15/2019 01/20/2017  Decreased Interest 2 1 0 1 1 0  Down, Depressed, Hopeless 0 1 0 0 1 0  PHQ - 2 Score 2 2 0 1 2 0  Altered sleeping 1 - - 2 3 -  Tired, decreased energy 1 - - 1 1 -  Change in appetite 0 - - 0 2 -  Feeling bad or failure about yourself  1 - - 0 2 -  Trouble concentrating 0 - - 0 0 -  Moving slowly or fidgety/restless 0 - - 0 0 -  Suicidal thoughts 0 - - 0 0 -  PHQ-9 Score 5 - - 4 10 -  Difficult doing work/chores Somewhat difficult - - Not difficult at all Extremely dIfficult -  Some recent data might be hidden     Review of Systems  Musculoskeletal:        Spasms  Neurological:  Positive for numbness.  Psychiatric/Behavioral:  The patient is nervous/anxious.   All other systems reviewed and are negative.     Objective:   Physical Exam Gen: no distress, normal appearing HEENT: oral mucosa pink and moist, NCAT Cardio: Reg rate Chest: normal effort, normal rate of breathing Abd: soft, non-distended Ext: no edema Psych:  pleasant, normal affect Skin: intact Neuro: Alert and oriented x3 Musculoskeletal: Well healing AKA     Assessment & Plan:  1) L AKA -prescribed outpatient PT and OT, he is ready for prosthesis -continue follow-up with surgery next month  2) Left phantom limb pain -increase gabapentin to 600mg  BID, start with 400mg  in the morning 600mg  at night since pain is worst at night and affecting sleep. If pain severe during day as well, can increase daytime dose to 600mg   3) HLD -history of LDL 108, now <100 -refilled statin  4) Constipation: -continue colace BID, refilled  5) PAD -refilled Plavix  6) HTN -refilled Cozaar and Coreg  7) Insomnia: -refilled trazodone

## 2021-03-17 ENCOUNTER — Other Ambulatory Visit: Payer: Self-pay

## 2021-03-17 NOTE — Patient Instructions (Addendum)
Benjamin [name] ,   The [facility] Managed Care Team is available to provide assistance to you with your healthcare needs at no cost and as a benefit of your [facility] Health plan.  Today's telephone contact with the patient and patient's sister, Benjamin [name], was to verify continued active enrollment in the Healthy [facility] Plan.  It was confirmed that Benjamin [name] is actively enrolled in this plan at this time and will continue to be an active patient with [facility].  The care management team is available to assist with your healthcare needs at any time. Please do not hesitate to contact me at the number below. .   Thank you,   Virgina Norfolk RN, BSN [facility] Coordinator [facility]   [facility] Mobile: [phone]

## 2021-03-18 ENCOUNTER — Other Ambulatory Visit: Payer: Self-pay

## 2021-03-18 ENCOUNTER — Ambulatory Visit: Payer: Self-pay

## 2021-03-18 NOTE — Patient Instructions (Signed)
Visit Information  Benjamin [name] was given information about Medicaid Managed Care team care coordination services as a part of their Healthy [facility] Medicaid benefit. Benjamin [name] verbally consented to engagement with the [facility] Managed Care team.   If you are experiencing a medical emergency, please call 911 or report to your local emergency department or urgent care.   If you have a non-emergency medical problem during routine business hours, please contact your provider's office and ask to speak with a nurse.   For questions related to your Healthy [facility] health plan, please call: [phone] or visit the homepage here: [email or web address]  If you would like to schedule transportation through your Healthy [facility] plan, please call the following number at least 2 days in advance of your appointment: [phone]  Call the [place] at [phone], at any time, 24 hours a day, 7 days a week. If you are in danger or need immediate medical attention call 911.  If you would like help to quit smoking, call 1-800-QUIT-NOW 830-160-4061) OR Espaol: 1-855-Djelo-Ya (0-814-481-8563) o para ms informacin haga clic aqu or Text READY to 200-400 to register via text  [name] - following are the goals we discussed in your visit today: Please see Patient Goals in the [place] of Care below.  Please see education materials related to today's visit provided as print materials.   The patient verbalized understanding of instructions provided today and agreed to receive a mailed copy of patient instruction and/or educational materials.  The Managed Medicaid care management team will reach out to the patient again over the next 30 days.   Benjamin Marvel RN, BSN Community Care Coordinator [place] [facility] Mobile: [phone]   Following is a copy of your plan of care:  Care Plan : [place] of Care  Updates made by Inge Rise, RN since 03/18/2021 12:00 AM     Problem: Chronic Disease Management and Care Coordination Needs for HTN, PAD with status post left AKA   Priority: High     Long-Range Goal: Development of Plan of Care for Chronic Disease Management and Care Coordination Needs (HTN, PAD with status post left AKA)   Start Date: 02/18/2021  Expected End Date: 06/18/2021  Priority: High  Note:   Current Barriers:  Knowledge Deficits related to plan of care for management of HTN and PAD with status post left AKA on 10/04/2020  Care Coordination needs related to Financial constraints related to obtaining medications and rent, Medication procurement, and ADL IADL limitations  Chronic Disease Management support and education needs related to HTN and PAD with status post left AKA Financial Constraints.  Difficulty obtaining medications  RNCM Clinical Goal(s):  Patient will verbalize understanding of plan for management of HTN and PAD with status post left AKA as evidenced by improved management of these chronic diseases/ verbalize basic understanding of HTN and PAD with status post left AKA disease process and self health management plan as evidenced by improved management of these chronic diseases. take all medications exactly as prescribed and will call provider for medication related questions as evidenced by being compliant with all medications    attend all scheduled medical appointments: 03/29/21 [facility] Social Worker; 04/26/21 Vascular follow up as evidenced by attending all scheduled appointments        demonstrate improved adherence to prescribed treatment plan for HTN and PAD with status post left AKA as evidenced by blood pressure readings  within normal limits and proper healing of left AKA. demonstrate improved health management independence as evidenced by blood pressure readings within normal limits and proper healing of left AKA.        continue to  work with Consulting civil engineer and/or Social Worker to address care management and care coordination needs related to HTN and PAD with status post left AKA as evidenced by adherence to CM Team Scheduled appointments     work with pharmacist to address complex medication regimen related to HTN and PAD with status post left AKA as evidenced by review of EMR and patient or pharmacist report    work with Education officer, museum to address Lacks knowledge of community resource: for Dental Care covered under [employer] related to the management of HTN and PAD with status post left AKA as evidenced by review of EMR and patient or Education officer, museum report     through collaboration with Consulting civil engineer, provider, and care team.   Interventions: Inter-disciplinary care team collaboration (see longitudinal plan of care) Evaluation of current treatment plan related to  self management and patient's adherence to plan as established by provider BSW contacted patient regarding Dental resources. Patient stated he would like dental resources in the area to be mailed to him. Patient stated that everything else was good and no other resources are needed at this time.    PAD with status post left AKA on 10/04/2020 and revision on 12/08/2020  (Status: Goal on Track (progressing): YES.) Long Term Goal  Evaluation of current treatment plan related to  PAD with status post left AKA , Financial constraints related to obtaining medications (zinc tablets for AKA healing) and rent, Medication procurement, Inability to perform IADL's independently, and Lacks knowledge of community resource: for dental care services covered [employer],  self-management and patient's adherence to plan as established by provider. Discussed plans with patient for ongoing care management follow up and provided patient with direct contact information for care management team Reviewed medications with patient and discussed importance of medication  compliance and benefits of zinc & Vitamin C for healing of left AKA; Reviewed scheduled/upcoming provider appointments including : [place] Worker 03/29/2021; 04/26/2021 Vascular follow up; Social Work referral for information regarding dental services covered under [employer] ; Pharmacy referral for complex medication regimen;   Hypertension: (Status: Goal on track: NO.) Long Term Goal  Last practice recorded BP readings:   BP Readings from Last 3 Encounters:  03/16/21 (!) 141/89  02/24/21 120/62  01/21/21 (!) 152/93   Most recent eGFR/CrCl:  Lab Results  Component Value Date   EGFR 101 07/22/2020    No components found for: CRCL  Evaluation of current treatment plan related to hypertension self management and patient's adherence to plan as established by provider;   Reviewed medications with patient and discussed importance of compliance;  Provided assistance with obtaining home blood pressure monitor via need to follow up with PCP regarding order to [place] for a blood pressure monitor;  Discussed plans with patient for ongoing care management follow up and provided patient with direct contact information for care management team; Reviewed scheduled/upcoming provider appointments including: 03/29/21 [facility] Social Worker follow up; 04/26/21 Vascular follow up  Patient Goals/Self-Care Activities: Take medications as prescribed   Attend all scheduled provider appointments Call pharmacy for medication refills 3-7 days in advance of running out of medications Call provider office for new concerns or questions  Work with the social worker to  address care coordination needs and will continue to work with the clinical team to address health care and disease management related needs Work with PCP to obtain a blood pressure monitor

## 2021-03-18 NOTE — Patient Outreach (Signed)
Medicaid Managed Care   Nurse Care Manager Note  03/18/2021 Name:  Benjamin [name] MRN:  [id number] DOB:  11-01-1960  Benjamin [name] is an 61 y.o. year old male who is a primary patient of [name], [place].  [facility] Managed Care Coordination team was consulted for assistance with:    HTN PAD with status post Left AKA  Mr. Riendeau was given information about Medicaid Managed Care Coordination team services today. Benjamin [name] Patient agreed to services and verbal consent obtained.  Engaged with patient by telephone for follow up visit in response to provider referral for case management and/or care coordination services.   Assessments/Interventions:  Review of past medical history, allergies, medications, health status, including review of consultants reports, laboratory and other test data, was performed as part of comprehensive evaluation and provision of chronic care management services.  SDOH (Social Determinants of Health) assessments and interventions performed:   Care Plan  Allergies  Allergen Reactions   Aspirin Nausea And Vomiting   Garlic Other (See Comments)    sneezing    Medications Reviewed Today     Reviewed by Inge Rise, RN (Case Manager) on 03/18/21 at 1112  Med List Status: <None>   Medication Order Taking? Sig Documenting Provider Last Dose Status Informant  acetaminophen (TYLENOL) 325 MG tablet [id number] No Take 1-2 tablets (325-650 mg total) by mouth every 4 (four) hours as needed for mild pain. Bary Leriche, PA-C Taking Active   atorvastatin (LIPITOR) 20 MG tablet [id number]  Take 1 tablet (20 mg total) by mouth daily. Izora Ribas, MD  Active   carvedilol (COREG) 6.25 MG tablet [id number]  Take 1 tablet (6.25 mg total) by mouth 2 (two) times daily with a meal. Raulkar, Clide Deutscher, MD  Active   clopidogrel (PLAVIX) 75 MG tablet [id number] No Take 1 tablet (75 mg total) by mouth daily.  Patient not taking: Reported on 03/16/2021    Not Taking  Active   docusate sodium (COLACE) 100 MG capsule [id number]  Take 1 capsule (100 mg total) by mouth daily. Izora Ribas, MD  Active   gabapentin (NEURONTIN) 600 MG tablet [id number]  Take 1 tablet (600 mg total) by mouth 2 (two) times daily. Izora Ribas, MD  Active   guaiFENesin-dextromethorphan [facility] DM) 100-10 MG/5ML syrup [id number] No Take 15 mLs by mouth every 4 (four) hours as needed for cough.  Patient not taking: Reported on 03/16/2021   Terrilee Croak, MD Not Taking Active   hydrOXYzine (ATARAX) 25 MG tablet [id number] No Take 1 tablet (25 mg total) by mouth at bedtime for anxiety  Taking Active   lactose free nutrition (BOOST) LIQD [id number] No Take 237 mLs by mouth 2 (two) times daily between meals. [provider] Taking Active Self  losartan (COZAAR) 25 MG tablet [id number]  Take 1 tablet (25 mg total) by mouth daily. Izora Ribas, MD  Active   methocarbamol (ROBAXIN) 500 MG tablet [id number] No Take 1 tablet (500 mg total) by mouth every 6 (six) hours as needed for muscle spasms. Bary Leriche, PA-C Taking Active   Multiple Vitamin (MULTIVITAMIN WITH MINERALS) TABS tablet [id number] No Take 1 tablet by mouth daily. Terrilee Croak, MD Taking Active   senna (SENOKOT) 8.6 MG TABS tablet [id number] No Take 1 tablet (8.6 mg total) by mouth 2 (two) times daily.  Patient not taking: Reported on 03/16/2021   Bary Leriche, PA-C Not Taking Active   traZODone (DESYREL) 50 MG  tablet [id number]  Take 1 tablet (50 mg total) by mouth at bedtime as needed for sleep Raulkar, Clide Deutscher, MD  Active             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[place]) 12/25/2020   Unilateral AKA, left ([place]) 12/15/2020   Protein-calorie malnutrition, severe 12/09/2020   Wound cellulitis 12/07/2020   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/2020   Ischemia of lower extremity  10/01/2020   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   Femoral-popliteal bypass graft occlusion, 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   PAD (peripheral artery disease) ([place]) 11/13/2019   Callus of foot 07/01/2019   Intermittent claudication ([place]) 01/20/2017   Need for influenza vaccination 01/20/2017   Needs smoking cessation education 09/06/2016   Abnormal echocardiogram 09/05/2016   Abnormal EKG 07/25/2016   Preoperative cardiovascular examination 07/25/2016   Shortness of breath on exertion 07/25/2016   Dyslipidemia, goal LDL below 70 07/25/2016   High blood pressure 07/25/2016   Abnormal chest x-ray 07/04/2016   Peripheral vascular disease ([place]) 07/04/2016   Decreased pedal pulses 06/24/2016   Varicose vein of leg 06/24/2016   Heavy smoker 06/24/2016   Abnormal weight loss 06/24/2016   Foot pain, left 06/24/2016   Screening for prostate cancer 06/24/2016    Conditions to be addressed/monitored per PCP order:  HTN and PAD with status post Left AKA  Care Plan : RN Care Manger Plan of Care  Updates made by Inge Rise, RN since 03/18/2021 12:00 AM     Problem: Chronic Disease Management and Care Coordination Needs for HTN, PAD with status post left AKA   Priority: High     Long-Range Goal: Development of Plan of Care for Chronic Disease Management and Care Coordination Needs (HTN, PAD with status post left AKA)   Start Date: 02/18/2021  Expected End Date: 06/18/2021  Priority: High  Note:   Current Barriers:  Knowledge Deficits related to plan of care for management of HTN and PAD with status post left AKA on 10/04/2020  Care Coordination needs related to Financial constraints related to obtaining medications and rent, Medication procurement, and ADL IADL limitations  Chronic Disease Management support and education needs related to HTN and PAD with status post left AKA Financial Constraints.  Difficulty obtaining medications  RNCM Clinical Goal(s):  Patient will verbalize  understanding of plan for management of HTN and PAD with status post left AKA as evidenced by improved management of these chronic diseases/ verbalize basic understanding of HTN and PAD with status post left AKA disease process and self health management plan as evidenced by improved management of these chronic diseases. take all medications exactly as prescribed and will call provider for medication related questions as evidenced by being compliant with all medications    attend all scheduled medical appointments: 03/29/21 [facility] Social Worker; 04/26/21 Vascular follow up as evidenced by attending all scheduled appointments        demonstrate improved adherence to prescribed treatment plan for HTN and PAD with status post left AKA as evidenced by blood pressure readings within normal limits and proper healing of left AKA. demonstrate improved health management independence as evidenced by blood pressure readings within normal limits and proper healing of left AKA.        continue to work with Consulting civil engineer and/or Social Worker to address care management and care coordination needs related to HTN  and PAD with status post left AKA as evidenced by adherence to CM Team Scheduled appointments     work with pharmacist to address complex medication regimen related to HTN and PAD with status post left AKA as evidenced by review of EMR and patient or pharmacist report    work with Education officer, museum to address Lacks knowledge of community resource: for Dental Care covered under [employer] related to the management of HTN and PAD with status post left AKA as evidenced by review of EMR and patient or Education officer, museum report     through collaboration with Consulting civil engineer, provider, and care team.   Interventions: Inter-disciplinary care team collaboration (see longitudinal plan of care) Evaluation of current treatment plan related to  self management and patient's adherence to plan as established by provider BSW  contacted patient regarding Dental resources. Patient stated he would like dental resources in the area to be mailed to him. Patient stated that everything else was good and no other resources are needed at this time.    PAD with status post left AKA on 10/04/2020 and revision on 12/08/2020  (Status: Goal on Track (progressing): YES.) Long Term Goal  Evaluation of current treatment plan related to  PAD with status post left AKA , Financial constraints related to obtaining medications (zinc tablets for AKA healing) and rent, Medication procurement, Inability to perform IADL's independently, and Lacks knowledge of community resource: for dental care services covered [employer],  self-management and patient's adherence to plan as established by provider. Discussed plans with patient for ongoing care management follow up and provided patient with direct contact information for care management team Reviewed medications with patient and discussed importance of medication compliance and benefits of zinc & Vitamin C for healing of left AKA; Reviewed scheduled/upcoming provider appointments including : [place] Worker 03/29/2021; 04/26/2021 Vascular follow up; Social Work referral for information regarding dental services covered under [employer] ; Pharmacy referral for complex medication regimen;   Hypertension: (Status: Goal on track: NO.) Long Term Goal  Last practice recorded BP readings:   BP Readings from Last 3 Encounters:  03/16/21 (!) 141/89  02/24/21 120/62  01/21/21 (!) 152/93   Most recent eGFR/CrCl:  Lab Results  Component Value Date   EGFR 101 07/22/2020    No components found for: CRCL  Evaluation of current treatment plan related to hypertension self management and patient's adherence to plan as established by provider;   Reviewed medications with patient and discussed importance of compliance;  Provided assistance with obtaining home blood pressure monitor  via need to follow up with PCP regarding order to [facility] for a blood pressure monitor;  Discussed plans with patient for ongoing care management follow up and provided patient with direct contact information for care management team; Reviewed scheduled/upcoming provider appointments including: 03/29/21 [facility] Social Worker follow up; 04/26/21 Vascular follow up  Patient Goals/Self-Care Activities: Take medications as prescribed   Attend all scheduled provider appointments Call pharmacy for medication refills 3-7 days in advance of running out of medications Call provider office for new concerns or questions  Work with the social worker to address care coordination needs and will continue to work with the clinical team to address health care and disease management related needs Work with PCP to obtain a blood pressure monitor       Follow Up:  Patient agrees to Care Plan and Follow-up.  Plan: The Managed Medicaid care management team will reach  out to the patient again over the next 30 days.  Date/time of next scheduled RN care management/care coordination outreach:  04/14/2021 at 11:00 AM  [place], [place] [facility] Mobile: [phone]

## 2021-03-22 ENCOUNTER — Telehealth: Payer: Self-pay

## 2021-03-22 ENCOUNTER — Telehealth: Payer: Self-pay | Admitting: Licensed Clinical Social Worker

## 2021-03-22 ENCOUNTER — Other Ambulatory Visit: Payer: Self-pay | Admitting: Physical Medicine and Rehabilitation

## 2021-03-22 MED ORDER — GABAPENTIN 600 MG PO TABS
600.0000 mg | ORAL_TABLET | Freq: Two times a day (BID) | ORAL | 3 refills | Status: DC
  Filled 2021-03-22 – 2021-03-23 (×2): qty 60, 30d supply, fill #0
  Filled 2021-04-16: qty 60, 30d supply, fill #1
  Filled 2021-05-11: qty 60, 30d supply, fill #2

## 2021-03-22 NOTE — Telephone Encounter (Signed)
LCSW called [facility]. Pt has not had an active account with them since 2019- no scripts are being processed and pt does not have any medications being sent to him. Per the representative pt scripts sent to them would cancel. LCSW has reached back out to [place], [place] w/ [facility] and has requested those medications be sent to [facility] pharmacy instead in order to streamline how pt continues to receive medications.   [name], MSW, LCSW Clinical Social Worker II [facility] Navigation  [phone]- work cell phone (preferred) [phone]- desk phone

## 2021-03-22 NOTE — Telephone Encounter (Signed)
The medication that was prescribed to patient on 03/16/21 was sent to [facility] and patient does not have an account with [facility]. Can you resend to [facility]?

## 2021-03-22 NOTE — Telephone Encounter (Signed)
Pt called confused regarding mail order pharmacy that he had been told would fill his medications. LCSW noted that [facility] f/u had sent some of his medications to [facility] pharmacy, not familiar with this service or if it is something pt is even eligible for. I have Dina Rich, CMA, at [facility] office to f/u on this. Once I have assessed current status I will f/u with pt again.   Benjamin [name], MSW, LCSW Clinical Social Worker II [facility] Navigation  [phone]- work cell phone (preferred) [phone]- desk phone

## 2021-03-23 ENCOUNTER — Other Ambulatory Visit: Payer: Self-pay

## 2021-03-23 ENCOUNTER — Other Ambulatory Visit: Payer: Self-pay | Admitting: Physical Medicine and Rehabilitation

## 2021-03-23 ENCOUNTER — Telehealth: Payer: Self-pay | Admitting: Licensed Clinical Social Worker

## 2021-03-23 ENCOUNTER — Other Ambulatory Visit: Payer: Self-pay | Admitting: Nurse Practitioner

## 2021-03-23 MED ORDER — LOSARTAN POTASSIUM 25 MG PO TABS
25.0000 mg | ORAL_TABLET | Freq: Every day | ORAL | 3 refills | Status: DC
  Filled 2021-03-23: qty 90, 90d supply, fill #0

## 2021-03-23 MED ORDER — TRAZODONE HCL 50 MG PO TABS
50.0000 mg | ORAL_TABLET | Freq: Every evening | ORAL | 0 refills | Status: DC | PRN
  Filled 2021-03-23: qty 30, 30d supply, fill #0

## 2021-03-23 MED ORDER — ATORVASTATIN CALCIUM 20 MG PO TABS
20.0000 mg | ORAL_TABLET | Freq: Every day | ORAL | 3 refills | Status: DC
  Filled 2021-03-23: qty 90, 90d supply, fill #0

## 2021-03-23 MED ORDER — CARVEDILOL 6.25 MG PO TABS
6.2500 mg | ORAL_TABLET | Freq: Two times a day (BID) | ORAL | 0 refills | Status: DC
  Filled 2021-03-23: qty 60, 30d supply, fill #0

## 2021-03-23 NOTE — Telephone Encounter (Signed)
Patient notified

## 2021-03-23 NOTE — Addendum Note (Signed)
Addended by: Horton Chin on: 03/23/2021 01:28 PM   Modules accepted: Orders

## 2021-03-23 NOTE — Telephone Encounter (Signed)
Patient also needs Atorvastatin, Carvedilol, Colace, Losartan, Trazodone sent to [facility].

## 2021-03-23 NOTE — Telephone Encounter (Signed)
All medications have been transitioned back to [facility] from [facility]. Appreciated Lakeesha's assistance. I have called pt and pt sister and let them know of new address for pharmacy and provided them with the number. They state understanding, no additional questions at this time, I remain available as needed.   [name], MSW, LCSW Clinical Social Worker II [facility] Navigation  [phone]- work cell phone (preferred) [phone]- desk phone

## 2021-03-29 ENCOUNTER — Other Ambulatory Visit: Payer: Self-pay

## 2021-03-29 NOTE — Patient Outreach (Signed)
Medicaid Managed Care Social Work Note  03/29/2021 Name:  Benjamin [name] MRN:  [id number] DOB:  June 07, 1960  Benjamin [name] is an 61 y.o. year old male who is a primary patient of [name], [place].  The [facility] Managed Care Coordination team was consulted for assistance with:  Community Resources   Mr. Staebell was given information about Medicaid Managed Care Coordination team services today. Benjamin [name] Patient agreed to services and verbal consent obtained.  Engaged with patient  for by telephone forfollow up visit in response to referral for case management and/or care coordination services.   Assessments/Interventions:  Review of past medical history, allergies, medications, health status, including review of consultants reports, laboratory and other test data, was performed as part of comprehensive evaluation and provision of chronic care management services.  SDOH: (Social Determinant of Health) assessments and interventions performed: 03/29/21: BSW completed follow up with patient. He stated he did not receive the letter with the dental resources. Patient stated he would also like eye resources. BSW will send a letter with dental and eye resources.  Advanced Directives Status:  Not addressed in this encounter.  Care Plan                 Allergies  Allergen Reactions   Aspirin Nausea And Vomiting   Garlic Other (See Comments)    sneezing    Medications Reviewed Today     Reviewed by Inge Rise, RN (Case Manager) on 03/18/21 at [place] List Status: <None>   Medication Order Taking? Sig Documenting Provider Last Dose Status Informant  acetaminophen (TYLENOL) 325 MG tablet [id number] No Take 1-2 tablets (325-650 mg total) by mouth every 4 (four) hours as needed for mild pain. Bary Leriche, PA-C Taking Active   atorvastatin (LIPITOR) 20 MG tablet [id number]  Take 1 tablet (20 mg total) by mouth daily. Izora Ribas, MD  Active   carvedilol (COREG) 6.25 MG tablet  [id number]  Take 1 tablet (6.25 mg total) by mouth 2 (two) times daily with a meal. Raulkar, Clide Deutscher, MD  Active   clopidogrel (PLAVIX) 75 MG tablet [id number] No Take 1 tablet (75 mg total) by mouth daily.  Patient not taking: Reported on 03/16/2021    Not Taking Active   docusate sodium (COLACE) 100 MG capsule [id number]  Take 1 capsule (100 mg total) by mouth daily. Izora Ribas, MD  Active   gabapentin (NEURONTIN) 600 MG tablet [id number]  Take 1 tablet (600 mg total) by mouth 2 (two) times daily. Izora Ribas, MD  Active   guaiFENesin-dextromethorphan [facility] DM) 100-10 MG/5ML syrup [id number] No Take 15 mLs by mouth every 4 (four) hours as needed for cough.  Patient not taking: Reported on 03/16/2021   Terrilee Croak, MD Not Taking Active   hydrOXYzine (ATARAX) 25 MG tablet [id number] No Take 1 tablet (25 mg total) by mouth at bedtime for anxiety  Taking Active   lactose free nutrition (BOOST) LIQD [id number] No Take 237 mLs by mouth 2 (two) times daily between meals. [provider] Taking Active Self  losartan (COZAAR) 25 MG tablet [id number]  Take 1 tablet (25 mg total) by mouth daily. Izora Ribas, MD  Active   methocarbamol (ROBAXIN) 500 MG tablet [id number] No Take 1 tablet (500 mg total) by mouth every 6 (six) hours as needed for muscle spasms. Bary Leriche, PA-C Taking Active   Multiple Vitamin (MULTIVITAMIN WITH MINERALS) TABS tablet [id number] No Take 1 tablet  by mouth daily. Terrilee Croak, MD Taking Active   senna (SENOKOT) 8.6 MG TABS tablet [id number] No Take 1 tablet (8.6 mg total) by mouth 2 (two) times daily.  Patient not taking: Reported on 03/16/2021   Bary Leriche, PA-C Not Taking Active   traZODone (DESYREL) 50 MG tablet [id number]  Take 1 tablet (50 mg total) by mouth at bedtime as needed for sleep Raulkar, Clide Deutscher, MD  Active             Patient Active Problem List   Diagnosis Date Noted   Acute blood loss anemia 12/25/2020   Hyponatremia  12/25/2020   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[place]) 12/25/2020   Unilateral AKA, left ([place]) 12/15/2020   Protein-calorie malnutrition, severe 12/09/2020   Wound cellulitis 12/07/2020   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/2020   Ischemia of lower extremity 10/01/2020   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[place]) 05/08/2020   Femoral-popliteal bypass graft occlusion, 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/13/2019   PAD (peripheral artery disease) ([place]) 11/13/2019   Callus of foot 07/01/2019   Intermittent claudication ([place]) 01/20/2017   Need for influenza vaccination 01/20/2017   Needs smoking cessation education 09/06/2016   Abnormal echocardiogram 09/05/2016   Abnormal EKG 07/25/2016   Preoperative cardiovascular examination 07/25/2016   Shortness of breath on exertion 07/25/2016   Dyslipidemia, goal LDL below 70 07/25/2016   High blood pressure 07/25/2016   Abnormal chest x-ray 07/04/2016   Peripheral vascular disease ([place]) 07/04/2016   Decreased pedal pulses 06/24/2016   Varicose vein of leg 06/24/2016   Heavy smoker 06/24/2016   Abnormal weight loss 06/24/2016   Foot pain, left 06/24/2016   Screening for prostate cancer 06/24/2016    Conditions to be addressed/monitored per PCP order:   dental and eye resources  Care Plan : RN Care Manger Plan of Care  Updates made by Ethelda Chick since 03/29/2021 12:00 AM     Problem: Chronic Disease Management and Care Coordination Needs for HTN, PAD with status post left AKA   Priority: High     Long-Range Goal: Development of Plan of Care for Chronic Disease Management and Care Coordination Needs (HTN, PAD with status post left AKA)   Start Date: 02/18/2021  Expected End Date: 06/18/2021  Priority: High  Note:   Current Barriers:  Knowledge Deficits related to plan of care for management of HTN and PAD with status post left AKA on 10/04/2020  Care Coordination needs related to Financial constraints related to obtaining medications and rent, Medication procurement, and  ADL IADL limitations  Chronic Disease Management support and education needs related to HTN and PAD with status post left AKA Financial Constraints.  Difficulty obtaining medications  RNCM Clinical Goal(s):  Patient will verbalize understanding of plan for management of HTN and PAD with status post left AKA as evidenced by improved management of these chronic diseases/ verbalize basic understanding of HTN and PAD with status post left AKA disease process and self health management plan as evidenced by improved management of these chronic diseases. take all medications exactly as prescribed and will call provider for medication related questions as evidenced by being compliant with all medications    attend all scheduled medical appointments: 03/29/21 [facility] Social Worker; 04/26/21 Vascular follow up as evidenced by attending all scheduled appointments        demonstrate improved adherence to prescribed treatment plan for HTN and PAD with status post left AKA as evidenced by blood pressure readings within normal limits and proper healing of left AKA.  demonstrate improved health management independence as evidenced by blood pressure readings within normal limits and proper healing of left AKA.        continue to work with Consulting civil engineer and/or Social Worker to address care management and care coordination needs related to HTN and PAD with status post left AKA as evidenced by adherence to CM Team Scheduled appointments     work with pharmacist to address complex medication regimen related to HTN and PAD with status post left AKA as evidenced by review of EMR and patient or pharmacist report    work with Education officer, museum to address Lacks knowledge of community resource: for Dental Care covered under [employer] related to the management of HTN and PAD with status post left AKA as evidenced by review of EMR and patient or Education officer, museum report     through collaboration with Consulting civil engineer, provider, and  care team.   Interventions: Inter-disciplinary care team collaboration (see longitudinal plan of care) Evaluation of current treatment plan related to  self management and patient's adherence to plan as established by provider BSW contacted patient regarding Dental resources. Patient stated he would like dental resources in the area to be mailed to him. Patient stated that everything else was good and no other resources are needed at this time.  03/29/21: BSW completed follow up with patient. He stated he did not receive the letter with the dental resources. Patient stated he would also like eye resources. BSW will send a letter with dental and eye resources.   PAD with status post left AKA on 10/04/2020 and revision on 12/08/2020  (Status: Goal on Track (progressing): YES.) Long Term Goal  Evaluation of current treatment plan related to  PAD with status post left AKA , Financial constraints related to obtaining medications (zinc tablets for AKA healing) and rent, Medication procurement, Inability to perform IADL's independently, and Lacks knowledge of community resource: for dental care services covered [employer],  self-management and patient's adherence to plan as established by provider. Discussed plans with patient for ongoing care management follow up and provided patient with direct contact information for care management team Reviewed medications with patient and discussed importance of medication compliance and benefits of zinc & Vitamin C for healing of left AKA; Reviewed scheduled/upcoming provider appointments including : [name] Worker 03/29/2021; 04/26/2021 Vascular follow up; Social Work referral for information regarding dental services covered under [employer] ; Pharmacy referral for complex medication regimen;   Hypertension: (Status: Goal on track: NO.) Long Term Goal  Last practice recorded BP readings:   BP Readings from Last 3 Encounters:  03/16/21 (!)  141/89  02/24/21 120/62  01/21/21 (!) 152/93   Most recent eGFR/CrCl:  Lab Results  Component Value Date   EGFR 101 07/22/2020    No components found for: CRCL  Evaluation of current treatment plan related to hypertension self management and patient's adherence to plan as established by provider;   Reviewed medications with patient and discussed importance of compliance;  Provided assistance with obtaining home blood pressure monitor via need to follow up with PCP regarding order to [place] for a blood pressure monitor;  Discussed plans with patient for ongoing care management follow up and provided patient with direct contact information for care management team; Reviewed scheduled/upcoming provider appointments including: 03/29/21 [facility] Social Worker follow up; 04/26/21 Vascular follow up  Patient Goals/Self-Care Activities: Take medications as prescribed   Attend all scheduled provider appointments Call  pharmacy for medication refills 3-7 days in advance of running out of medications Call provider office for new concerns or questions  Work with the social worker to address care coordination needs and will continue to work with the clinical team to address health care and disease management related needs Work with PCP to obtain a blood pressure monitor       Follow up:  Patient agrees to Care Plan and Follow-up.  Plan: The Managed Medicaid care management team will reach out to the patient again over the next 30 days.  Date/time of next scheduled Social Work care management/care coordination outreach:  04/26/21  Mickel Fuchs, Arita Miss, [place] Medicaid Team  [phone]

## 2021-03-29 NOTE — Patient Instructions (Signed)
Visit Information  Benjamin [name] was given information about Medicaid Managed Care team care coordination services as a part of their Healthy [facility] Medicaid benefit. Benjamin [name] verbally consented to engagement with the [facility] Managed Care team.   If you are experiencing a medical emergency, please call 911 or report to your local emergency department or urgent care.   If you have a non-emergency medical problem during routine business hours, please contact your provider's office and ask to speak with a nurse.   For questions related to your Healthy [facility] health plan, please call: [phone] or visit the homepage here: [email or web address]  If you would like to schedule transportation through your Healthy [facility] plan, please call the following number at least 2 days in advance of your appointment: [phone]  Call the [place] at [phone], at any time, 24 hours a day, 7 days a week. If you are in danger or need immediate medical attention call 911.  If you would like help to quit smoking, call 1-800-QUIT-NOW 575-499-9128) OR Espaol: 1-855-Djelo-Ya (2-947-654-6503) o para ms informacin haga clic aqu or Text READY to 200-400 to register via text  [name] - following are the goals we discussed in your visit today:   Goals Addressed   None      Social Worker will follow up in 30 days .   Marland Kitchen Benjamin [name], BSW, [place]  High Risk Managed Medicaid Team  [phone]   Following is a copy of your plan of care:  Care Plan : [place] of Care  Updates made by Ethelda Chick since 03/29/2021 12:00 AM     Problem: Chronic Disease Management and Care Coordination Needs for HTN, PAD with status post left AKA   Priority: High     Long-Range Goal: Development of Plan of Care for Chronic Disease Management and Care Coordination Needs (HTN, PAD with status  post left AKA)   Start Date: 02/18/2021  Expected End Date: 06/18/2021  Priority: High  Note:   Current Barriers:  Knowledge Deficits related to plan of care for management of HTN and PAD with status post left AKA on 10/04/2020  Care Coordination needs related to Financial constraints related to obtaining medications and rent, Medication procurement, and ADL IADL limitations  Chronic Disease Management support and education needs related to HTN and PAD with status post left AKA Financial Constraints.  Difficulty obtaining medications  RNCM Clinical Goal(s):  Patient will verbalize understanding of plan for management of HTN and PAD with status post left AKA as evidenced by improved management of these chronic diseases/ verbalize basic understanding of HTN and PAD with status post left AKA disease process and self health management plan as evidenced by improved management of these chronic diseases. take all medications exactly as prescribed and will call provider for medication related questions as evidenced by being compliant with all medications    attend all scheduled medical appointments: 03/29/21 [facility] Social Worker; 04/26/21 Vascular follow up as evidenced by attending all scheduled appointments        demonstrate improved adherence to prescribed treatment plan for HTN and PAD with status post left AKA as evidenced by blood pressure readings within normal limits and proper healing of left AKA. demonstrate improved health management independence as evidenced by blood pressure readings within normal limits and proper healing of left AKA.        continue to work with Consulting civil engineer  and/or Education officer, museum to address care management and care coordination needs related to HTN and PAD with status post left AKA as evidenced by adherence to CM Team Scheduled appointments     work with pharmacist to address complex medication regimen related to HTN and PAD with status post left AKA as evidenced by review of  EMR and patient or pharmacist report    work with Education officer, museum to address Lacks knowledge of community resource: for Dental Care covered under [employer] related to the management of HTN and PAD with status post left AKA as evidenced by review of EMR and patient or Education officer, museum report     through collaboration with Consulting civil engineer, provider, and care team.   Interventions: Inter-disciplinary care team collaboration (see longitudinal plan of care) Evaluation of current treatment plan related to  self management and patient's adherence to plan as established by provider BSW contacted patient regarding Dental resources. Patient stated he would like dental resources in the area to be mailed to him. Patient stated that everything else was good and no other resources are needed at this time.  03/29/21: BSW completed follow up with patient. He stated he did not receive the letter with the dental resources. Patient stated he would also like eye resources. BSW will send a letter with dental and eye resources.   PAD with status post left AKA on 10/04/2020 and revision on 12/08/2020  (Status: Goal on Track (progressing): YES.) Long Term Goal  Evaluation of current treatment plan related to  PAD with status post left AKA , Financial constraints related to obtaining medications (zinc tablets for AKA healing) and rent, Medication procurement, Inability to perform IADL's independently, and Lacks knowledge of community resource: for dental care services covered [employer],  self-management and patient's adherence to plan as established by provider. Discussed plans with patient for ongoing care management follow up and provided patient with direct contact information for care management team Reviewed medications with patient and discussed importance of medication compliance and benefits of zinc & Vitamin C for healing of left AKA; Reviewed scheduled/upcoming provider appointments including :  [place] Worker 03/29/2021; 04/26/2021 Vascular follow up; Social Work referral for information regarding dental services covered under [employer] ; Pharmacy referral for complex medication regimen;   Hypertension: (Status: Goal on track: NO.) Long Term Goal  Last practice recorded BP readings:   BP Readings from Last 3 Encounters:  03/16/21 (!) 141/89  02/24/21 120/62  01/21/21 (!) 152/93   Most recent eGFR/CrCl:  Lab Results  Component Value Date   EGFR 101 07/22/2020    No components found for: CRCL  Evaluation of current treatment plan related to hypertension self management and patient's adherence to plan as established by provider;   Reviewed medications with patient and discussed importance of compliance;  Provided assistance with obtaining home blood pressure monitor via need to follow up with PCP regarding order to [place] for a blood pressure monitor;  Discussed plans with patient for ongoing care management follow up and provided patient with direct contact information for care management team; Reviewed scheduled/upcoming provider appointments including: 03/29/21 [facility] Social Worker follow up; 04/26/21 Vascular follow up  Patient Goals/Self-Care Activities: Take medications as prescribed   Attend all scheduled provider appointments Call pharmacy for medication refills 3-7 days in advance of running out of medications Call provider office for new concerns or questions  Work with the social worker to address care coordination needs and will  continue to work with the clinical team to address health care and disease management related needs Work with PCP to obtain a blood pressure monitor

## 2021-03-30 ENCOUNTER — Ambulatory Visit: Payer: Medicaid Other | Attending: Nurse Practitioner | Admitting: Rehabilitation

## 2021-03-30 ENCOUNTER — Other Ambulatory Visit: Payer: Self-pay

## 2021-03-30 ENCOUNTER — Telehealth: Payer: Self-pay

## 2021-03-30 DIAGNOSIS — R2681 Unsteadiness on feet: Secondary | ICD-10-CM | POA: Insufficient documentation

## 2021-03-30 DIAGNOSIS — S78112A Complete traumatic amputation at level between left hip and knee, initial encounter: Secondary | ICD-10-CM | POA: Insufficient documentation

## 2021-03-30 NOTE — Telephone Encounter (Signed)
Need order sent for left AKA prothesis to [facility]. Patient already has appt.

## 2021-03-30 NOTE — Therapy (Signed)
[facility] [address] [place], [place], [address] Phone: [phone]   Fax:  [phone]  Patient Details  Name: Benjamin [name] MRN: [id number] Date of Birth: 1960-06-17 Referring Provider:  Arnette Felts, FNP  Encounter Date: 03/30/2021   Pt arrived for PT therapy appt today without prosthesis.  Pt reports that he has not received it nor has he had an appt at [facility] to get fitted.  Educated pt that because he has Medicaid ([place]) he only has a certain number of visits he can be approved for and therefore would recommend waiting until he has prosthesis before beginning PT.  He reports he is moving well in the house either with w/c or hopping with RW.  He does endorse some difficulty in bathroom due to being small, but is managing.  Upon questioning, he also does not have a shrinker for shaping limb.  PT did look at limb and scar is well healed and skin looks intact with good hair growth.  Educated on purpose of shrinker.  PT made appt with [facility] while pt present and PT to get order sent from Dr. Carlis Abbott for prosthesis.  Pt given number to this clinic so that he may schedule PT once he is given delivery date. Pt verbalized understanding to all.    Harriet [place], PT, MPT [facility] [address] [place] [address] [place], [place], [address] Phone: [phone]   Fax:  [phone] 03/30/21, 2:46 PM

## 2021-04-05 NOTE — Telephone Encounter (Signed)
Mr. Benak appointment is at 2 PM tomorrow (04/06/2021) at [facility]. I have the notes, information and script on your desk. If granted please fill out script,  so it can be faxed in the morning.   Thank you

## 2021-04-06 NOTE — Telephone Encounter (Signed)
Rx, notes & demographics faxed to [facility] this morning.

## 2021-04-14 ENCOUNTER — Other Ambulatory Visit: Payer: Self-pay

## 2021-04-14 NOTE — Patient Instructions (Signed)
Visit Information  Benjamin [name] was given information about Medicaid Managed Care team care coordination services as a part of their Healthy [facility] Medicaid benefit. Benjamin [name] verbally consented to engagement with the [facility] Managed Care team.   If you are experiencing a medical emergency, please call 911 or report to your local emergency department or urgent care.   If you have a non-emergency medical problem during routine business hours, please contact your provider's office and ask to speak with a nurse.   For questions related to your Healthy [facility] health plan, please call: [phone] or visit the homepage here: [email or web address]  If you would like to schedule transportation through your Healthy [facility] plan, please call the following number at least 2 days in advance of your appointment: [phone]  For information about your ride after you set it up, call Ride Assist at [phone]. Use this number to activate a Will Call pickup, or if your transportation is late for a scheduled pickup. Use this number, too, if you need to make a change or cancel a previously scheduled reservation.  If you need transportation services right away, call [phone]. The after-hours call center is staffed 24 hours to handle ride assistance and urgent reservation requests (including discharges) 365 days a year. Urgent trips include sick visits, hospital discharge requests and life-sustaining treatment.  Call the [place] at [phone], at any time, 24 hours a day, 7 days a week. If you are in danger or need immediate medical attention call 911.  If you would like help to quit smoking, call 1-800-QUIT-NOW 479-455-8508) OR Espaol: 1-855-Djelo-Ya (2-993-716-9678) o para ms informacin haga clic aqu or Text READY to 200-400 to register via text  [name] - following are the goals we discussed in your visit today:   Goals  Addressed   None     Please see education materials related to today's visit provided as print materials.   The patient verbalized understanding of instructions, educational materials, and care plan provided today and agreed to receive a mailed copy of patient instructions, educational materials, and care plan.   The Managed Medicaid care management team will reach out to the patient again over the next 30 days.   Benjamin Marvel RN, BSN Community Care Coordinator [place] [facility] Mobile: [phone]   Following is a copy of your plan of care:  Care Plan : [place] of Care  Updates made by Benjamin Rise, RN since 04/14/2021 12:00 AM     Problem: Chronic Disease Management and Care Coordination Needs for HTN, PAD with status post left AKA   Priority: High     Long-Range Goal: Development of Plan of Care for Chronic Disease Management and Care Coordination Needs (HTN, PAD with status post left AKA)   Start Date: 02/18/2021  Expected End Date: 06/18/2021  Priority: High  Note:   Current Barriers:  Knowledge Deficits related to plan of care for management of HTN and PAD with status post left AKA on 10/04/2020  Care Coordination needs related to Financial constraints related to obtaining medications and rent, Medication procurement, and ADL IADL limitations  Chronic Disease Management support and education needs related to HTN and PAD with status post left AKA Financial Constraints.  Difficulty obtaining medications  RNCM Clinical Goal(s):  RNCM will help patient verbalize understanding of plan for management of HTN and PAD with status post left AKA as evidenced by improved management of these chronic diseases/  verbalize basic understanding of HTN and PAD with status post left AKA disease process and self health management plan as evidenced by improved management of these chronic diseases. take all medications exactly as prescribed and will  call provider for medication related questions as evidenced by being compliant with all medications    attend all scheduled medical appointments: 04/26/21 Vascular follow up as evidenced by attending all scheduled appointments        demonstrate improved adherence to prescribed treatment plan for HTN and PAD with status post left AKA as evidenced by blood pressure readings within normal limits and proper healing of left AKA. demonstrate improved health management independence as evidenced by blood pressure readings within normal limits and proper healing of left AKA.        continue to work with Consulting civil engineer and/or Social Worker to address care management and care coordination needs related to HTN and PAD with status post left AKA as evidenced by adherence to CM Team Scheduled appointments     work with pharmacist to address complex medication regimen related to HTN and PAD with status post left AKA as evidenced by review of EMR and patient or pharmacist report    work with Education officer, museum to address Lacks knowledge of community resource: for [place] covered under [employer] related to the management of HTN and PAD with status post left AKA as evidenced by review of EMR and patient or Education officer, museum report     through collaboration with Consulting civil engineer, provider, and care team.   Interventions: Inter-disciplinary care team collaboration (see longitudinal plan of care) Evaluation of current treatment plan related to  self management and patient's adherence to plan as established by provider BSW contacted patient regarding Dental resources. Patient stated he would like dental resources in the area to be mailed to him. Patient stated that everything else was good and no other resources are needed at this time.  03/29/21: BSW completed follow up with patient. He stated he did not receive the letter with the dental resources. Patient stated he would also like eye resources. BSW will  send a letter with dental and eye resources.   PAD with status post left AKA on 10/04/2020 and revision on 12/08/2020  (Status: Goal on Track (progressing): YES.) Long Term Goal  Evaluation of current treatment plan related to  PAD with status post left AKA , Financial constraints related to obtaining medications (zinc tablets for AKA healing) and rent, Medication procurement, Inability to perform IADL's independently, and Lacks knowledge of community resource: for dental care & vision care services covered ny Healthy [employer],  self-management and patient's adherence to plan as established by provider. Discussed plans with patient for ongoing care management follow up and provided patient with direct contact information for care management team Reviewed medications with patient and discussed importance of medication compliance and benefits of zinc & Vitamin C for healing of left AKA; Reviewed scheduled/upcoming provider appointments including : 04/26/2021 Vascular follow up; Social Work referral for information regarding dental & vision services covered under Healthy [place] Medicaid ; Pharmacy referral for complex medication regimen;  Patient's sister reports patient received shrinker for left AKA from [facility].  Patient has been consistently using the shrinker amd sister states there is a noticeable difference in left AKA stump.  Stump is well healed and patient is expecting to receive his prosthesis in near future.  Patient is now using a walker to ambulate and has  started to drive his car.  Sister reports patient has had no falls and that he is eating better and gaining weight.  We discussed how patient needs to pace himself in terms of his activity and be aware of his energy level as he starts back to walking, driving and keeping up with medical appointments.  Hypertension: (Status: Goal on track: NO.) Long Term Goal  Last practice recorded BP readings:   BP Readings from Last 3  Encounters:  03/16/21 (!) 141/89  02/24/21 120/62  01/21/21 (!) 152/93   Most recent eGFR/CrCl:  Lab Results  Component Value Date   EGFR 101 07/22/2020    No components found for: CRCL  Evaluation of current treatment plan related to hypertension self management and patient's adherence to plan as established by provider;   Reviewed medications with patient and discussed importance of compliance;  Provided assistance with obtaining home blood pressure monitor via patient did receive blood pressure monitor.  Instructed patient's sister to check patient's blood pressure at home occasionally so we have an idea of blood pressure at home versus at a doctor's office.  Sister agreed with this instruction  ;  Discussed plans with patient for ongoing care management follow up and provided patient with direct contact information for care management team; Reviewed scheduled/upcoming provider appointments including: 04/26/21 Vascular follow up  Patient Goals/Self-Care Activities: Take medications as prescribed   Attend all scheduled provider appointments Call pharmacy for medication refills 3-7 days in advance of running out of medications Call provider office for new concerns or questions  Work with the social worker to address care coordination needs and will continue to work with the clinical team to address health care and disease management related needs Work with PCP to obtain a blood pressure monitor- completed.

## 2021-04-14 NOTE — Patient Outreach (Signed)
Medicaid Managed Care   Nurse Care Manager Note  04/14/2021 Name:  Benjamin [name] MRN:  [id number] DOB:  Dec 25, 1960  Benjamin [name] is an 61 y.o. year old male who is a primary patient of [name], [place].  The [facility] Managed Care Coordination team was consulted for assistance with:    PAD with ststua post Left AKA and HTN  Mr. Ciotti was given information about Medicaid Managed Care Coordination team services today. Benjamin [name] Patient agreed to services and verbal consent obtained.  Engaged with patient by telephone for follow up visit in response to provider referral for case management and/or care coordination services.   Assessments/Interventions:  Review of past medical history, allergies, medications, health status, including review of consultants reports, laboratory and other test data, was performed as part of comprehensive evaluation and provision of chronic care management services.  SDOH (Social Determinants of Health) assessments and interventions performed:   Care Plan  Allergies  Allergen Reactions   Aspirin Nausea And Vomiting   Garlic Other (See Comments)    sneezing    Medications Reviewed Today     Reviewed by Inge Rise, RN (Case Manager) on 04/14/21 at 1128  Med List Status: <None>   Medication Order Taking? Sig Documenting Provider Last Dose Status Informant  acetaminophen (TYLENOL) 325 MG tablet [id number] No Take 1-2 tablets (325-650 mg total) by mouth every 4 (four) hours as needed for mild pain. Bary Leriche, PA-C Taking Active   atorvastatin (LIPITOR) 20 MG tablet [id number]  Take 1 tablet (20 mg total) by mouth daily. Izora Ribas, MD  Active   carvedilol (COREG) 6.25 MG tablet [id number]  Take 1 tablet (6.25 mg total) by mouth 2 (two) times daily with a meal. Raulkar, Clide Deutscher, MD  Active   clopidogrel (PLAVIX) 75 MG tablet [id number] No Take 1 tablet (75 mg total) by mouth daily.  Patient not taking: Reported on 03/16/2021    Not  Taking Active   docusate sodium (COLACE) 100 MG capsule [id number]  Take 1 capsule (100 mg total) by mouth daily. Izora Ribas, MD  Active   gabapentin (NEURONTIN) 600 MG tablet [id number]  Take 1 tablet (600 mg total) by mouth 2 (two) times daily. Izora Ribas, MD  Active   guaiFENesin-dextromethorphan [facility] DM) 100-10 MG/5ML syrup [id number] No Take 15 mLs by mouth every 4 (four) hours as needed for cough.  Patient not taking: Reported on 03/16/2021   Terrilee Croak, MD Not Taking Active   hydrOXYzine (ATARAX) 25 MG tablet [id number] No Take 1 tablet (25 mg total) by mouth at bedtime for anxiety  Taking Active   lactose free nutrition (BOOST) LIQD [id number] No Take 237 mLs by mouth 2 (two) times daily between meals. [provider] Taking Active Self  losartan (COZAAR) 25 MG tablet [id number]  Take 1 tablet (25 mg total) by mouth daily. Izora Ribas, MD  Active   methocarbamol (ROBAXIN) 500 MG tablet [id number] No Take 1 tablet (500 mg total) by mouth every 6 (six) hours as needed for muscle spasms. Bary Leriche, PA-C Taking Active   Multiple Vitamin (MULTIVITAMIN WITH MINERALS) TABS tablet [id number] No Take 1 tablet by mouth daily. Terrilee Croak, MD Taking Active   senna (SENOKOT) 8.6 MG TABS tablet [id number] No Take 1 tablet (8.6 mg total) by mouth 2 (two) times daily.  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   Ischemia of lower  extremity 10/01/2020   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   Femoral-popliteal bypass graft occlusion, 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   PAD (peripheral artery disease) ([place]) 11/13/2019   Callus of foot 07/01/2019   Intermittent claudication ([place]) 01/20/2017   Need for influenza vaccination 01/20/2017   Needs smoking cessation education 09/06/2016   Abnormal echocardiogram 09/05/2016   Abnormal EKG 07/25/2016   Preoperative cardiovascular examination 07/25/2016   Shortness of breath on exertion 07/25/2016   Dyslipidemia, goal LDL below 70 07/25/2016   High blood pressure 07/25/2016   Abnormal chest x-ray 07/04/2016   Peripheral vascular disease ([place]) 07/04/2016   Decreased pedal pulses 06/24/2016   Varicose vein of leg 06/24/2016   Heavy smoker 06/24/2016   Abnormal weight loss 06/24/2016   Foot pain, left 06/24/2016   Screening for prostate cancer 06/24/2016    Conditions to be addressed/monitored per PCP order:  HTN and PAD with status post Left AKA  Care Plan : RN Care Manger Plan of Care  Updates made by Inge Rise, RN since 04/14/2021 12:00 AM     Problem: Chronic Disease Management and Care Coordination Needs for HTN, PAD with status post left AKA   Priority: High     Long-Range Goal: Development of Plan of Care for Chronic Disease Management and Care Coordination Needs (HTN, PAD with status post left AKA)   Start Date: 02/18/2021  Expected End Date: 06/18/2021  Priority: High  Note:   Current Barriers:  Knowledge Deficits related to plan of care for management of HTN and PAD with status post left AKA on 10/04/2020  Care Coordination needs related to Financial constraints related to obtaining medications and rent, Medication procurement, and ADL IADL limitations  Chronic Disease Management support and education needs related to HTN and PAD with status post left AKA Financial Constraints.  Difficulty obtaining medications  RNCM Clinical Goal(s):  RNCM will help patient  verbalize understanding of plan for management of HTN and PAD with status post left AKA as evidenced by improved management of these chronic diseases/ verbalize basic understanding of HTN and PAD with status post left AKA disease process and self health management plan as evidenced by improved management of these chronic diseases. take all medications exactly as prescribed and will call provider for medication related questions as evidenced by being compliant with all medications    attend all scheduled medical appointments: 04/26/21 Vascular follow up as evidenced by attending all scheduled appointments        demonstrate improved adherence to prescribed treatment plan for HTN and PAD with status post left AKA as evidenced by blood pressure readings within normal limits and proper healing of left AKA. demonstrate improved health management independence as evidenced by blood pressure readings within normal limits and proper healing of left AKA.        continue to work with Consulting civil engineer and/or Social Worker to address care management and care coordination needs related to HTN and  PAD with status post left AKA as evidenced by adherence to CM Team Scheduled appointments     work with pharmacist to address complex medication regimen related to HTN and PAD with status post left AKA as evidenced by review of EMR and patient or pharmacist report    work with Education officer, museum to address Lacks knowledge of community resource: for [place] covered under [employer] related to the management of HTN and PAD with status post left AKA as evidenced by review of EMR and patient or Education officer, museum report     through collaboration with Consulting civil engineer, provider, and care team.   Interventions: Inter-disciplinary care team collaboration (see longitudinal plan of care) Evaluation of current treatment plan related to  self management and patient's adherence to plan as established by provider BSW  contacted patient regarding Dental resources. Patient stated he would like dental resources in the area to be mailed to him. Patient stated that everything else was good and no other resources are needed at this time.  03/29/21: BSW completed follow up with patient. He stated he did not receive the letter with the dental resources. Patient stated he would also like eye resources. BSW will send a letter with dental and eye resources.   PAD with status post left AKA on 10/04/2020 and revision on 12/08/2020  (Status: Goal on Track (progressing): YES.) Long Term Goal  Evaluation of current treatment plan related to  PAD with status post left AKA , Financial constraints related to obtaining medications (zinc tablets for AKA healing) and rent, Medication procurement, Inability to perform IADL's independently, and Lacks knowledge of community resource: for dental care & vision care services covered ny [employer],  self-management and patient's adherence to plan as established by provider. Discussed plans with patient for ongoing care management follow up and provided patient with direct contact information for care management team Reviewed medications with patient and discussed importance of medication compliance and benefits of zinc & Vitamin C for healing of left AKA; Reviewed scheduled/upcoming provider appointments including : 04/26/2021 Vascular follow up; Social Work referral for information regarding dental & vision services covered under Healthy [place] Medicaid ; Pharmacy referral for complex medication regimen;  Patient's sister reports patient received shrinker for left AKA from [facility].  Patient has been consistently using the shrinker amd sister states there is a noticeable difference in left AKA stump.  Stump is well healed and patient is expecting to receive his prosthesis in near future.  Patient is now using a walker to ambulate and has started to drive his car.  Sister reports  patient has had no falls and that he is eating better and gaining weight.  We discussed how patient needs to pace himself in terms of his activity and be aware of his energy level as he starts back to walking, driving and keeping up with medical appointments.  Hypertension: (Status: Goal on track: NO.) Long Term Goal  Last practice recorded BP readings:   BP Readings from Last 3 Encounters:  03/16/21 (!) 141/89  02/24/21 120/62  01/21/21 (!) 152/93   Most recent eGFR/CrCl:  Lab Results  Component Value Date   EGFR 101 07/22/2020    No components found for: CRCL  Evaluation of current treatment plan related to hypertension self management and patient's adherence to plan as established by provider;   Reviewed medications with patient and discussed importance of compliance;  Provided assistance with obtaining home blood pressure monitor  via patient did receive blood pressure monitor.  Instructed patient's sister to check patient's blood pressure at home occasionally so we have an idea of blood pressure at home versus at a doctor's office.  Sister agreed with this instruction  ;  Discussed plans with patient for ongoing care management follow up and provided patient with direct contact information for care management team; Reviewed scheduled/upcoming provider appointments including: 04/26/21 Vascular follow up  Patient Goals/Self-Care Activities: Take medications as prescribed   Attend all scheduled provider appointments Call pharmacy for medication refills 3-7 days in advance of running out of medications Call provider office for new concerns or questions  Work with the social worker to address care coordination needs and will continue to work with the clinical team to address health care and disease management related needs Work with PCP to obtain a blood pressure monitor- completed.       Follow Up:  Patient agrees to Care Plan and Follow-up.  Plan: The Managed Medicaid care  management team will reach out to the patient again over the next 30 days.  Date/time of next scheduled RN care management/care coordination outreach:  05/12/2021 at 11:00 am  [place], [place] Network Mobile: [phone]

## 2021-04-15 ENCOUNTER — Other Ambulatory Visit: Payer: Self-pay

## 2021-04-16 ENCOUNTER — Other Ambulatory Visit: Payer: Self-pay

## 2021-04-26 ENCOUNTER — Other Ambulatory Visit: Payer: Self-pay

## 2021-04-26 ENCOUNTER — Ambulatory Visit: Payer: Medicaid Other | Admitting: Physician Assistant

## 2021-04-26 ENCOUNTER — Ambulatory Visit (HOSPITAL_COMMUNITY)
Admission: RE | Admit: 2021-04-26 | Discharge: 2021-04-26 | Disposition: A | Discharge status: Home / Self Care | Payer: Medicaid Other | Source: Ambulatory Visit | Attending: Surgery | Admitting: Surgery

## 2021-04-26 VITALS — BP 166/104 | HR 83 | Temp 98.1°F | Resp 18 | Ht 67.0 in | Wt 150.0 lb

## 2021-04-26 DIAGNOSIS — I739 Peripheral vascular disease, unspecified: Secondary | ICD-10-CM

## 2021-04-26 DIAGNOSIS — Z89612 Acquired absence of left leg above knee: Secondary | ICD-10-CM | POA: Diagnosis not present

## 2021-04-26 NOTE — Patient Outreach (Signed)
Care Coordination ? ?04/26/2021 ? ?ANASTASIO WOGAN ?01-21-1961 ?[id number] ? ? ?Medicaid Managed Care  ? ?Unsuccessful Outreach Note ? ?04/26/2021 ?Name: ANDRI PRESTIA MRN: [id number] DOB: December 29, 1960 ? ?Referred by: Arnette Felts, FNP ?Reason for referral : High Risk Managed Medicaid (MM Social work Soil scientist) ? ? ?An unsuccessful telephone outreach was attempted today. The patient was referred to the case management team for assistance with care management and care coordination.  ? ?Follow Up Plan: The care management team will reach out to the patient again over the next 30-45 days.  ? ?Gus Puma, BSW, MHA ?[employer] [facility] Health  ?High Risk Managed Medicaid Team  ?[phone]  ?

## 2021-04-26 NOTE — Patient Instructions (Signed)
Visit Information ? ?Mr. [name]  - as a part of your Medicaid benefit, you are eligible for care management and care coordination services at no cost or copay. I was unable to reach you by phone today but would be happy to help you with your health related needs. Please feel free to call me @ [place] [place] number).  ? ?A member of the Managed Medicaid care management team will reach out to you again over the next 30-45 days.  ? ?Gus Puma, BSW, MHA ?[employer] [facility]  ?High Risk Managed Medicaid Team  ?[phone]  ?

## 2021-04-27 ENCOUNTER — Encounter: Payer: Self-pay | Admitting: Physician Assistant

## 2021-04-27 NOTE — Progress Notes (Signed)
? ? ? ? ? ? ?HPI:  This is a 61 y.o. male who is here for follow up.   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  This was performed on 12/08/2020. ? ?Pt returns today for follow up.  Pt states he has done well and is very active with a walker at home.  He denise complaints otherwise.  He denies right LE rest pain and non healing wounds.  ? ?Allergies  ?Allergen Reactions  ? Aspirin Nausea And Vomiting  ? Garlic Other (See Comments)  ?  sneezing  ? ? ?Current Outpatient Medications  ?Medication Sig Dispense Refill  ? acetaminophen (TYLENOL) 325 MG tablet Take 1-2 tablets (325-650 mg total) by mouth every 4 (four) hours as needed for mild pain. 200 tablet 0  ? atorvastatin (LIPITOR) 20 MG tablet Take 1 tablet (20 mg total) by mouth daily. 90 tablet 3  ? carvedilol (COREG) 6.25 MG tablet Take 1 tablet (6.25 mg total) by mouth 2 (two) times daily with a meal. 60 tablet 0  ? docusate sodium (COLACE) 100 MG capsule Take 1 capsule (100 mg total) by mouth daily. 30 capsule 0  ? gabapentin (NEURONTIN) 600 MG tablet Take 1 tablet (600 mg total) by mouth 2 (two) times daily. 60 tablet 3  ? guaiFENesin-dextromethorphan (ROBITUSSIN DM) 100-10 MG/5ML syrup Take 15 mLs by mouth every 4 (four) hours as needed for cough. 118 mL 0  ? hydrOXYzine (ATARAX) 25 MG tablet Take 1 tablet (25 mg total) by mouth at bedtime for anxiety 30 tablet 0  ? lactose free nutrition (BOOST) LIQD Take 237 mLs by mouth 2 (two) times daily between meals.    ? losartan (COZAAR) 25 MG tablet Take 1 tablet (25 mg total) by mouth daily. 90 tablet 3  ? methocarbamol (ROBAXIN) 500 MG tablet Take 1 tablet (500 mg total) by mouth every 6 (six) hours as needed for muscle spasms. 30 tablet 0  ? Multiple Vitamin (MULTIVITAMIN WITH MINERALS) TABS tablet Take 1 tablet by mouth daily.    ? senna (SENOKOT) 8.6 MG TABS tablet Take 1 tablet (8.6 mg  total) by mouth 2 (two) times daily. 60 tablet 0  ? traZODone (DESYREL) 50 MG tablet Take 1 tablet (50 mg total) by mouth at bedtime as needed for sleep 30 tablet 0  ? clopidogrel (PLAVIX) 75 MG tablet Take 1 tablet (75 mg total) by mouth daily. (Patient not taking: Reported on 03/16/2021) 30 tablet 0  ? ?No current facility-administered medications for this visit.  ? ? ? ROS:  See HPI ? ?Physical Exam: ? ? ?   ?ABI Findings:  ?+---------+------------------+-----+----------+--------+  ?Right    Rt Pressure (mmHg)IndexWaveform  Comment   ?+---------+------------------+-----+----------+--------+  ?Brachial 185                                        ?+---------+------------------+-----+----------+--------+  ?PTA      92                0.50 monophasic          ?+---------+------------------+-----+----------+--------+  ?DP       90                0.49 monophasic          ?+---------+------------------+-----+----------+--------+  ?Great [phone]  0.46                     ?+---------+------------------+-----+----------+--------+  ? ?+--------+------------------+-----+--------+-------+  ?Left    Lt Pressure (mmHg)IndexWaveformComment  ?+--------+------------------+-----+--------+-------+  ?[id number]                                     ?+--------+------------------+-----+--------+-------+  ? ?+-------+-----------+-----------+------------+------------+  ?ABI/TBIToday's ABIToday's TBIPrevious ABIPrevious TBI  ?+-------+-----------+-----------+------------+------------+  ?Right  0.50       0.46       0.79        0.56          ?+-------+-----------+-----------+------------+------------+  ?Left   AKA        AKA        0.00        0.00          ?+-------+-----------+-----------+------------+------------+  ? ?   ? ?Right ABIs appear decreased compared to prior study on 05/04/2020.  ?   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 The right toe-brachial index is abnormal.  ? ?Incision:  left AKA well healed without erythema or edema.  Warm to touch.  ?Extremities:  right LE with doppler signal Dp/PT monophasic, no ischemic changes. ?Heart: RRR ?Lungs: Non labored breathing, clear to auscultation ?Abdomen:  soft NTTP ? ? ?Assessment/Plan:  This is a 61 y.o. male who is s/p:left above-the-knee amputation which was not healing and the only remaining option was to revise this to a fairly short left above-the-knee amputation.  This was performed on 12/08/2020. ? Right ABI has monophasic wave forms with 0.50 index and 85 toe pressure.  He remains asymptomatic for symptoms of  ischemia.  ? ?F/U for repeat ABI on the right LE in 6 months.  If he has problems or concerns he will call. ? ?The patient has a left Above Knee Amputation. The patient is well motivated to return to their prior functional status by utilizing a prosthesis to perform ADL's and maintain a healthy lifestyle. The patient has the physical and cognitive capacity to function with a prosthesis.  ? ?Functional Level: ?K3 Community Ambulator: Has the ability or potential for ambulation with variable cadence, to traverse most environmental barriers, and may have vocational, therapeutic, or exercise activity that demands prosthetic utilization beyond simple locomotion. Pt may benefit from Human resources officer.  ? ?Residual Limb History: ?The skin condition of the residual limb is good. The patient will continue to monitor the skin of the residual limb and follow hygiene instructions.  ?The patient is experiencing no pain related to amputation ? ?Prosthetic Prescription Plan: ?Counseling and education regarding prosthetic management will be provided to the patient via a certified prosthetist. ?A multi-discipline team, including physical therapy, will manage the prosthetic fabrication, fitting and prosthetic gait training.  ? ? ? ?Mosetta Pigeon ?PA-C ?[facility] ?[phone] ? ? ?Clinic MD:  Myra Gianotti ?

## 2021-04-29 ENCOUNTER — Other Ambulatory Visit: Payer: Self-pay | Admitting: *Deleted

## 2021-04-29 DIAGNOSIS — I739 Peripheral vascular disease, unspecified: Secondary | ICD-10-CM

## 2021-05-11 ENCOUNTER — Ambulatory Visit (HOSPITAL_COMMUNITY)
Admission: EM | Admit: 2021-05-11 | Discharge: 2021-05-11 | Disposition: A | Discharge status: Home / Self Care | Payer: Medicaid Other | Attending: Internal Medicine | Admitting: Internal Medicine

## 2021-05-11 ENCOUNTER — Encounter (HOSPITAL_COMMUNITY): Payer: Self-pay | Admitting: Emergency Medicine

## 2021-05-11 ENCOUNTER — Other Ambulatory Visit: Payer: Self-pay

## 2021-05-11 DIAGNOSIS — R002 Palpitations: Secondary | ICD-10-CM | POA: Diagnosis present

## 2021-05-11 LAB — CBC WITH DIFFERENTIAL/PLATELET
Abs Immature Granulocytes: 0.02 10*3/uL (ref 0.00–0.07)
Basophils Absolute: 0 10*3/uL (ref 0.0–0.1)
Basophils Relative: 1 %
Eosinophils Absolute: 0.1 10*3/uL (ref 0.0–0.5)
Eosinophils Relative: 2 %
HCT: 36.8 % — ABNORMAL LOW (ref 39.0–52.0)
Hemoglobin: 11.7 g/dL — ABNORMAL LOW (ref 13.0–17.0)
Immature Granulocytes: 0 %
Lymphocytes Relative: 27 %
Lymphs Abs: 1.4 10*3/uL (ref 0.7–4.0)
MCH: 21.4 pg — ABNORMAL LOW (ref 26.0–34.0)
MCHC: 31.8 g/dL (ref 30.0–36.0)
MCV: 67.4 fL — ABNORMAL LOW (ref 80.0–100.0)
Monocytes Absolute: 0.4 10*3/uL (ref 0.1–1.0)
Monocytes Relative: 7 %
Neutro Abs: 3.5 10*3/uL (ref 1.7–7.7)
Neutrophils Relative %: 63 %
Platelets: 336 10*3/uL (ref 150–400)
RBC: 5.46 MIL/uL (ref 4.22–5.81)
RDW: 21.1 % — ABNORMAL HIGH (ref 11.5–15.5)
WBC: 5.4 10*3/uL (ref 4.0–10.5)
nRBC: 0 % (ref 0.0–0.2)

## 2021-05-11 LAB — BASIC METABOLIC PANEL
Anion gap: 9 (ref 5–15)
BUN: 14 mg/dL (ref 8–23)
CO2: 24 mmol/L (ref 22–32)
Calcium: 9.2 mg/dL (ref 8.9–10.3)
Chloride: 106 mmol/L (ref 98–111)
Creatinine, Ser: 0.96 mg/dL (ref 0.61–1.24)
GFR, Estimated: >60 mL/min (ref >60)
Glucose, Bld: 99 mg/dL (ref 70–99)
Potassium: 4.1 mmol/L (ref 3.5–5.1)
Sodium: 139 mmol/L (ref 135–145)

## 2021-05-11 MED ORDER — CLOPIDOGREL BISULFATE 75 MG PO TABS
75.0000 mg | ORAL_TABLET | Freq: Every day | ORAL | 0 refills | Status: DC
  Filled 2021-05-11 – 2021-05-19 (×2): qty 30, 30d supply, fill #0

## 2021-05-11 NOTE — Discharge Instructions (Addendum)
Please follow-up with your care physician ?You may need cardiac monitoring for up to a month ?Your primary care physician can set up Holter monitoring. ?Please call your primary care physician to set up Holter monitoring. ?If you have worsening symptoms please go to the emergency room to be evaluated further. ?

## 2021-05-11 NOTE — ED Triage Notes (Signed)
Pt reports that he will have times that heart will race, this on going for a month. Pt reports that when his heart is racing he feels dizzy. Pt unsure of when he uses the walker to ambulate around is when he notices HR will increase.  Reports that he not currently on any blood thinners for about 2 months.  ?Pt unsure if the Gabapentin is causing the heart to race bc time he started on it is time symptoms started.  ?

## 2021-05-12 ENCOUNTER — Other Ambulatory Visit: Payer: Self-pay

## 2021-05-12 NOTE — Patient Instructions (Signed)
Visit Information ? ?Benjamin [name] was given information about Medicaid Managed Care team care coordination services as a part of their Healthy [facility] Medicaid benefit. Benjamin [name] verbally consented to engagement with the [facility] Managed Care team.  ? ?If you are experiencing a medical emergency, please call 911 or report to your local emergency department or urgent care.  ? ?If you have a non-emergency medical problem during routine business hours, please contact your provider's office and ask to speak with a nurse.  ? ?For questions related to your Healthy [facility] health plan, please call: [phone] or visit the homepage here: [email or web address] ? ?If you would like to schedule transportation through your Healthy [facility] plan, please call the following number at least 2 days in advance of your appointment: [phone] ? For information about your ride after you set it up, call Ride Assist at [phone]. Use this number to activate a Will Call pickup, or if your transportation is late for a scheduled pickup. Use this number, too, if you need to make a change or cancel a previously scheduled reservation. ? If you need transportation services right away, call [phone]. The after-hours call center is staffed 24 hours to handle ride assistance and urgent reservation requests (including discharges) 365 days a year. Urgent trips include sick visits, hospital discharge requests and life-sustaining treatment. ? ?Call the [facility] Line at [phone], at any time, 24 hours a day, 7 days a week. If you are in danger or need immediate medical attention call 911. ? ?If you would like help to quit smoking, call 1-800-QUIT-NOW (765-141-5028) OR Espa?ol: 1-855-D?jelo-Ya 502-520-2587) o para m?s informaci?n haga clic aqu? or Text READY to 200-400 to register via text ? ?Benjamin [name] - following are the goals we discussed in your visit today:  Please see  Patient's Goals in the RN Care Manager Plan of Care below. ? ? ?Please see education materials related to today's visit provided as print materials.  ? ?The patient verbalized understanding of instructions, educational materials, and care plan provided today and agreed to receive a mailed copy of patient instructions, educational materials, and care plan.  ? ?The Managed Medicaid care management team will reach out to the patient again over the next 30 days.  ? ?Benjamin Norfolk RN, BSN ?Community Care Coordinator ?[place]  [facility] ?Mobile: [phone]  ? ?Following is a copy of your plan of care:  ?Care Plan : RN Care Manger Plan of Care  ?Updates made by Leane Call, RN since 05/12/2021 12:00 AM  ?  ? ?Problem: Chronic Disease Management and Care Coordination Needs for HTN, PAD with status post left AKA   ?Priority: High  ?  ? ?Long-Range Goal: Development of Plan of Care for Chronic Disease Management and Care Coordination Needs (HTN, PAD with status post left AKA)   ?Start Date: 02/18/2021  ?Expected End Date: 06/18/2021  ?Priority: High  ?Note:   ?Current Barriers:  ?Knowledge Deficits related to plan of care for management of HTN and PAD with status post left AKA on 10/04/2020  ?Care Coordination needs related to Financial constraints related to obtaining medications and rent, Medication procurement, and ADL IADL limitations  ?Chronic Disease Management support and education needs related to HTN and PAD with status post left AKA ?Corporate treasurer.  ?Difficulty obtaining medications ? ?RNCM Clinical Goal(s):  ?RNCM will help patient verbalize understanding of plan for management of HTN and PAD with status post left AKA as evidenced by improved management  of these chronic diseases/ ?verbalize basic understanding of HTN and PAD with status post left AKA disease process and self health management plan as evidenced by improved management of these chronic diseases. ?take all  medications exactly as prescribed and will call provider for medication related questions as evidenced by being compliant with all medications    ?attend all scheduled medical appointments: 05/25/2021 PCP visit as evidenced by attending all scheduled appointments        ?demonstrate improved adherence to prescribed treatment plan for HTN and PAD with status post left AKA as evidenced by blood pressure readings within normal limits and proper healing of left AKA. ?demonstrate improved health management independence as evidenced by blood pressure readings within normal limits and proper healing of left AKA.        ?continue to work with Medical illustratorN Care Manager and/or Social Worker to address care management and care coordination needs related to HTN and PAD with status post left AKA as evidenced by adherence to CM Team Scheduled appointments     ?work with pharmacist to address complex medication regimen related to HTN and PAD with status post left AKA as evidenced by review of EMR and patient or pharmacist report    ?work with Child psychotherapistsocial worker to address Lacks knowledge of community resource: for Dental Care & Vision Care covered under [employer] insurance related to the management of HTN and PAD with status post left AKA as evidenced by review of EMR and patient or Child psychotherapistsocial worker report     through collaboration with Medical illustratorN Care manager, provider, and care team.  ? ?Interventions: ?Inter-disciplinary care team collaboration (see longitudinal plan of care) ?Evaluation of current treatment plan related to  self management and patient's adherence to plan as established by provider ?BSW contacted patient regarding Dental resources. Patient stated he would like dental resources in the area to be mailed to him. Patient stated that everything else was good and no other resources are needed at this time.  ?03/29/21: BSW completed follow up with patient. He stated he did not receive the letter with the dental resources. Patient stated he  would also like eye resources. BSW will send a letter with dental and eye resources. ? ? ?PAD with status post left AKA on 10/04/2020 and revision on 12/08/2020  (Status: Goal on Track (progressing): YES.) Long Term Goal  ?Evaluation of current treatment plan related to  PAD with status post left AKA , Financial constraints related to obtaining medications (zinc tablets for AKA healing) and rent, Medication procurement, Inability to perform IADL's independently, and Lacks knowledge of community resource: for dental care & vision care services covered ny [employer] insurance,  self-management and patient's adherence to plan as established by provider. ?Discussed plans with patient for ongoing care management follow up and provided patient with direct contact information for care management team ?Reviewed medications with patient and discussed importance of medication compliance and benefits of zinc & Vitamin C for healing of left AKA; ?Reviewed scheduled/upcoming provider appointments including : 05/25/2021 PCP Visit; ?Social Work referral for information regarding dental & vision services covered under [employer] ; ?Pharmacy referral for complex medication regimen;  ?Patient [facility].  Stump is well healed and patient is expecting to receive his prosthesis tomorrow.  Patient continues to use a walker to ambulate and is back driving his car.  Patient complains of bilateral shoulder "pressure", soreness and aching from using the walker.  The aching  shoulders are relieved by lying down to relieve pressure on shoulders.  This RN Care Manger instructed patient to use Tylenol PRN for shoulder aches.  Encouraged him to use PRN Tylenol prior to leaving the house when he knows he will doing doing a lot of walking with the walker.  Patient verbalized understanding. Patient denies any falls and improved appetite.  This RN Care Manager continued to encourage patient to  pace himself in terms of his activities especially activities involving walking. ? ?Hypertension: (Status: Goal on track: NO.) Long Term Goal  ?Last practice recorded BP readings:  ? ?BP Readings from Last 3 Enc

## 2021-05-12 NOTE — Patient Outreach (Signed)
?Medicaid Managed Care   ?Nurse Care Manager Note ? ?05/12/2021 ?Name:  Benjamin [name] MRN:  [id number] DOB:  [birth year] ? ?Benjamin [name] is an 61 y.o. year old male who is a primary patient of Arnette Felts, FNP.  [facility] Managed Care Coordination team was consulted for assistance with:    ?HTN ?PAD with status post Left AKA ? ?Benjamin [name] was given information about Medicaid Managed Care Coordination team services today. Benjamin [name] Patient agreed to services and verbal consent obtained. ? ?Engaged with patient by telephone for follow up visit in response to provider referral for case management and/or care coordination services.  ? ?Assessments/Interventions:  Review of past medical history, allergies, medications, health status, including review of consultants reports, laboratory and other test data, was performed as part of comprehensive evaluation and provision of chronic care management services. ? ?SDOH (Social Determinants of Health) assessments and interventions performed: ? ? ?Care Plan ? ?Allergies  ?Allergen Reactions  ? Aspirin Nausea And Vomiting  ? Garlic Other (See Comments)  ?  sneezing  ? ? ?Medications Reviewed Today   ? ? Reviewed by Leane Call, RN (Case Manager) on 05/12/21 at 1135  Med List Status: <None>  ? ?Medication Order Taking? Sig Documenting Provider Last Dose Status Informant  ?acetaminophen (TYLENOL) 325 MG tablet [id number] No Take 1-2 tablets (325-650 mg total) by mouth every 4 (four) hours as needed for mild pain. Jacquelynn Cree, PA-C Taking Active   ?atorvastatin (LIPITOR) 20 MG tablet [id number] No Take 1 tablet (20 mg total) by mouth daily. Horton Chin, MD Taking Active   ?carvedilol (COREG) 6.25 MG tablet [id number] No Take 1 tablet (6.25 mg total) by mouth 2 (two) times daily with a meal. Raulkar, Drema Pry, MD Taking Active   ?clopidogrel (PLAVIX) 75 MG tablet [id number]  Take 1 tablet (75 mg total) by mouth daily.   Active   ?docusate sodium (COLACE) 100 MG  capsule [id number] No Take 1 capsule (100 mg total) by mouth daily. Horton Chin, MD Taking Active   ?gabapentin (NEURONTIN) 600 MG tablet [id number] No Take 1 tablet (600 mg total) by mouth 2 (two) times daily. Horton Chin, MD Taking Active   ?guaiFENesin-dextromethorphan [facility] DM) 100-10 MG/5ML syrup [id number] No Take 15 mLs by mouth every 4 (four) hours as needed for cough. Lorin Glass, MD Taking Active   ?hydrOXYzine (ATARAX) 25 MG tablet [id number] No Take 1 tablet (25 mg total) by mouth at bedtime for anxiety  Taking Active   ?lactose free nutrition (BOOST) LIQD [id number] No Take 237 mLs by mouth 2 (two) times daily between meals. [provider] Taking Active Self  ?losartan (COZAAR) 25 MG tablet [id number] No Take 1 tablet (25 mg total) by mouth daily. Horton Chin, MD Taking Active   ?methocarbamol (ROBAXIN) 500 MG tablet [id number] No Take 1 tablet (500 mg total) by mouth every 6 (six) hours as needed for muscle spasms. Jacquelynn Cree, PA-C Taking Active   ?Multiple Vitamin (MULTIVITAMIN WITH MINERALS) TABS tablet [id number] No Take 1 tablet by mouth daily. Lorin Glass, MD Taking Active   ?senna (SENOKOT) 8.6 MG TABS tablet [id number] No Take 1 tablet (8.6 mg total) by mouth 2 (two) times daily. Jacquelynn Cree, PA-C Taking Active   ?traZODone (DESYREL) 50 MG tablet [id number] No Take 1 tablet (50 mg total) by mouth at bedtime as needed for sleep Raulkar, Drema Pry, MD Taking Active   ? ?  ?  ? ?  ? ? ?  Patient Active Problem List  ? Diagnosis Date Noted  ? Acute blood loss anemia 12/25/2020  ? Hyponatremia 12/25/2020  ? 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 Protein-calorie malnutrition, severe 12/09/2020  ? Wound cellulitis 12/07/2020  ? 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 Ischemia of lower extremity 10/01/2020  ? 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 PAD (peripheral artery disease) (HCC) 11/13/2019  ? Callus of foot 07/01/2019  ? Intermittent claudication (HCC) 01/20/2017  ? Need for influenza vaccination 01/20/2017  ? Needs smoking cessation education 09/06/2016  ? Abnormal echocardiogram 09/05/2016  ? Abnormal EKG 07/25/2016  ? Preoperative cardiovascular examination 07/25/2016  ? Shortness of breath on exertion 07/25/2016  ? Dyslipidemia, goal LDL below 70 07/25/2016  ? High blood pressure 07/25/2016  ? Abnormal chest x-ray 07/04/2016  ? Peripheral vascular disease (HCC) 07/04/2016  ? Decreased pedal pulses 06/24/2016  ? 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 Heavy smoker 06/24/2016  ? Abnormal weight loss 06/24/2016  ? Foot pain, left 06/24/2016  ? Screening for prostate cancer 06/24/2016  ? ? ?Conditions to be addressed/monitored per PCP order:  HTN and PAD with status post Left AKA ? ?Care Plan : RN Care Manger Plan of Care  ?Updates made by Leane Call, RN since 05/12/2021 12:00 AM  ?  ? ?Problem: Chronic Disease Management and Care Coordination Needs for HTN, PAD with status post left AKA   ?Priority: High  ?  ? ?Long-Range Goal: Development of Plan of Care for Chronic Disease Management and Care Coordination Needs (HTN, PAD with status post left AKA)   ?Start Date: 02/18/2021  ?Expected End Date: 06/18/2021  ?Priority: High  ?Note:   ?Current Barriers:  ?Knowledge Deficits related to plan of care for management of HTN and PAD with status post left AKA on 10/04/2020  ?Care Coordination needs related to Financial constraints related to obtaining medications and rent, Medication procurement, and ADL IADL limitations  ?Chronic Disease Management support and education needs related to HTN and PAD with status post left AKA ?Corporate treasurer.  ?Difficulty obtaining medications ? ?RNCM Clinical Goal(s):  ?RNCM will help patient verbalize understanding of plan for management of HTN and PAD with status post left AKA as evidenced by improved  management of these chronic diseases/ ?verbalize basic understanding of HTN and PAD with status post left AKA disease process and self health management plan as evidenced by improved management of these chronic diseases. ?take all medications exactly as prescribed and will call provider for medication related questions as evidenced by being compliant with all medications    ?attend all scheduled medical appointments: 05/25/2021 PCP visit as evidenced by attending all scheduled appointments        ?demonstrate improved adherence to prescribed treatment plan for HTN and PAD with status post left AKA as evidenced by blood pressure readings within normal limits and proper healing of left AKA. ?demonstrate improved health management independence as evidenced by blood pressure readings within normal limits and proper healing of left AKA.        ?continue to work with Medical illustrator and/or Social Worker to address care management and care coordination needs related to HTN and PAD with status post left AKA as evidenced by adherence to CM Team Scheduled appointments     ?work with pharmacist to address complex medication regimen related to HTN and PAD with status post left AKA  as evidenced by review of EMR and patient or pharmacist report    ?work with Child psychotherapistsocial worker to address Lacks knowledge of community resource: for Dental Care & Vision Care covered under [employer] insurance related to the management of HTN and PAD with status post left AKA as evidenced by review of EMR and patient or Child psychotherapistsocial worker report     through collaboration with Medical illustratorN Care manager, provider, and care team.  ? ?Interventions: ?Inter-disciplinary care team collaboration (see longitudinal plan of care) ?Evaluation of current treatment plan related to  self management and patient's adherence to plan as established by provider ?BSW contacted patient regarding Dental resources. Patient stated he would like dental resources in the area to be mailed to him.  Patient stated that everything else was good and no other resources are needed at this time.  ?03/29/21: BSW completed follow up with patient. He stated he did not receive the letter with the dental resources. P

## 2021-05-12 NOTE — ED Provider Notes (Signed)
?[facility] ? ? ? ?CSN: [id number] ?Arrival date & time: 05/11/21  1053 ? ? ?  ? ?History   ?Chief Complaint ?Chief Complaint  ?Patient presents with  ? Palpitations  ? ? ?HPI ?Benjamin [name] is a 61 y.o. male comes to the urgent care with palpitations.  Patient complains of palpitations over the past month.  Palpitations started spontaneously and resolved spontaneously.  Patient denies any chest pain or chest pressure.  He endorses dizziness during these episodes.  No new medications.  No nausea or vomiting.  No cough or sputum production.  No fever or chills.  Patient has a history of peripheral vascular disease that is post AKA of the left lower extremity..  No chest pain or chest pressure at this time.  No fever or chills.  No nausea or vomiting. ? ?Previous records reviewed shows myocardial perfusion results from 9/21 and echocardiogram from 6/21..  ? ?HPI ? ?Past Medical History:  ?Diagnosis Date  ? Back pain   ? Heavy smoker   ? Hyperlipidemia LDL goal <70   ? Hypertension   ? 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  ? Bilateral femoral artery disease  ? ? ?Patient Active Problem List  ? Diagnosis Date Noted  ? Acute blood loss anemia 12/25/2020  ? Hyponatremia 12/25/2020  ? 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 Protein-calorie malnutrition, severe 12/09/2020  ? Wound cellulitis 12/07/2020  ? 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 Ischemia of lower extremity 10/01/2020  ? 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 PAD (peripheral artery disease) (HCC) 11/13/2019  ? Callus of foot 07/01/2019  ? Intermittent claudication (HCC) 01/20/2017  ? Need for influenza vaccination 01/20/2017  ? Needs smoking cessation education 09/06/2016  ? Abnormal echocardiogram 09/05/2016  ? Abnormal EKG 07/25/2016  ? Preoperative cardiovascular examination 07/25/2016  ? Shortness of  breath on exertion 07/25/2016  ? Dyslipidemia, goal LDL below 70 07/25/2016  ? High blood pressure 07/25/2016  ? Abnormal chest x-ray 07/04/2016  ? Peripheral vascular disease (HCC) 07/04/2016  ? Decreased pedal pulses 06/24/2016  ? 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 Heavy smoker 06/24/2016  ? Abnormal weight loss 06/24/2016  ? Foot pain, left 06/24/2016  ? Screening for prostate cancer 06/24/2016  ? ? ?Past Surgical History:  ?Procedure Laterality Date  ? ABDOMINAL AORTOGRAM W/LOWER EXTREMITY N/A 07/12/2016  ? Procedure: Abdominal Aortogram w/Lower Extremity;  Surgeon: Nada LibmanBrabham, Vance W, MD;  [facility];  Service: Cardiovascular;  Laterality: N/A;  Bilateral extermity: Patent Renal As. No sig Dz in infrarenal Abd Aorta. Normal Bilat Iliac arteries. R SFA is 100% @ origin - recon in AK-Pop A. R PT A patent. L CFA occluded. L PFA recon @ origin, L SFA occluded w/ recon in AK Pop A.  ? ABDOMINAL AORTOGRAM W/LOWER EXTREMITY N/A 10/15/2019  ? Procedure: ABDOMINAL AORTOGRAM W/LOWER EXTREMITY;  Surgeon: Nada LibmanBrabham, Vance W, MD;  [facility];  Service: Cardiovascular;  Laterality: N/A;  ? AMPUTATION Left 10/04/2020  ? Procedure: LEFT ABOVE KNEE AMPUTATION;  Surgeon: Nada LibmanBrabham, Vance W, MD;  [facility];  Service: Vascular;  Laterality: Left;  ? AMPUTATION Left 12/08/2020  ? Procedure: RIGHT ABOVE KNEE AMPUTATION REVISION;  Surgeon: Chuck Hintickson, Christopher S, MD;  [facility];  Service: Vascular;  Laterality: Left;  ? AORTA - BILATERAL FEMORAL ARTERY BYPASS GRAFT N/A 05/08/2020  ? Procedure:  AORTA BIFEMORAL BYPASS GRAFT;  Surgeon: Nada Libman, MD;  Location: [facility] OR;  Service: Vascular;  Laterality: N/A;  ? COLONOSCOPY  06/2016  ? never  ? ENDARTERECTOMY FEMORAL Left 11/14/2019  ? Procedure: Left groin exploration, Redo left femoral artery exposure;  Surgeon: Nada Libman, MD;  [facility];  Service: Vascular;  Laterality: Left;  ? FEMORAL-FEMORAL BYPASS GRAFT N/A 11/13/2019  ?  Procedure: BYPASS GRAFT FEMORAL-FEMORAL ARTERY RIGHT TO LEFT USING HEMASHIELD GOLD GRAFT 48mm x 30cm;  Surgeon: Nada Libman, MD;  Location: [facility] OR;  Service: Vascular;  Laterality: N/A;  ? FEMORAL-POPLITEAL BYPASS GRAFT Left 11/13/2019  ? Procedure: LEFT FEMORAL BELOW KNEE-POPLITEAL ARTERY USING NON-REVERSED GREATER SAPHENOUS VEIN;  Surgeon: Nada Libman, MD;  [facility];  Service: Vascular;  Laterality: Left;  ? FEMORAL-POPLITEAL BYPASS GRAFT Left 05/08/2020  ? Procedure: LEFT REDO BYPASS GRAFT COMMON FEMORAL- BELOW KNEE POPLITEAL ARTERY USING PROPATEN GRAFT;  Surgeon: Nada Libman, MD;  [facility];  Service: Vascular;  Laterality: Left;  ? I & D EXTREMITY Left 09/07/2020  ? Procedure: IRRIGATION AND DEBRIDEMENT LEFT LEG WITH EXCISION OF LEFT LEG DISTAL BYPASS GRAFT;  Surgeon: Cephus Shelling, MD;  Location: [facility] OR;  Service: Vascular;  Laterality: Left;  ? LOWER EXTREMITY ANGIOGRAM Left 11/14/2019  ? Procedure: LEFT LOWER EXTREMITY ANGIOGRAM, BYPASS GRAFT ANGIOPLASTY;  Surgeon: Nada Libman, MD;  [facility];  Service: Vascular;  Laterality: Left;  ? PERIPHERAL VASCULAR INTERVENTION  10/15/2019  ? Procedure: PERIPHERAL VASCULAR INTERVENTION;  Surgeon: Nada Libman, MD;  [facility];  Service: Cardiovascular;;  Rt Iliac  ? REMOVAL OF GRAFT Left 10/04/2020  ? Procedure: REMOVAL OF LEFT FEMORAL TO POPITEAL BYPASS GRAFT;  Surgeon: Nada Libman, MD;  [facility];  Service: Vascular;  Laterality: Left;  ? ? ? ? ? ?Home Medications   ? ?Prior to Admission medications   ?Medication Sig Start Date End Date Taking? Authorizing Provider  ?acetaminophen (TYLENOL) 325 MG tablet Take 1-2 tablets (325-650 mg total) by mouth every 4 (four) hours as needed for mild pain. 12/25/20   Love, Evlyn Kanner, PA-C  ?atorvastatin (LIPITOR) 20 MG tablet Take 1 tablet (20 mg total) by mouth daily. 03/23/21   Raulkar, Drema Pry, MD  ?carvedilol (COREG) 6.25 MG tablet Take 1 tablet (6.25  mg total) by mouth 2 (two) times daily with a meal. 03/23/21   Raulkar, Drema Pry, MD  ?clopidogrel (PLAVIX) 75 MG tablet Take 1 tablet (75 mg total) by mouth daily. 05/11/21     ?docusate sodium (COLACE) 100 MG capsule Take 1 capsule (100 mg total) by mouth daily. 03/16/21   Raulkar, Drema Pry, MD  ?gabapentin (NEURONTIN) 600 MG tablet Take 1 tablet (600 mg total) by mouth 2 (two) times daily. 03/22/21   Raulkar, Drema Pry, MD  ?guaiFENesin-dextromethorphan (ROBITUSSIN DM) 100-10 MG/5ML syrup Take 15 mLs by mouth every 4 (four) hours as needed for cough. 12/15/20   Lorin Glass, MD  ?hydrOXYzine (ATARAX) 25 MG tablet Take 1 tablet (25 mg total) by mouth at bedtime for anxiety 02/10/21     ?lactose free nutrition (BOOST) LIQD Take 237 mLs by mouth 2 (two) times daily between meals.    [provider]  ?losartan (COZAAR) 25 MG tablet Take 1 tablet (25 mg total) by mouth daily. 03/23/21   Raulkar, Drema Pry, MD  ?methocarbamol (ROBAXIN) 500 MG tablet Take 1 tablet (500 mg total) by mouth every 6 (six) hours  as needed for muscle spasms. 12/25/20   Jacquelynn Cree, PA-C  ?Multiple Vitamin (MULTIVITAMIN WITH MINERALS) TABS tablet Take 1 tablet by mouth daily. 12/16/20   Lorin Glass, MD  ?senna (SENOKOT) 8.6 MG TABS tablet Take 1 tablet (8.6 mg total) by mouth 2 (two) times daily. 12/25/20   Love, Evlyn Kanner, PA-C  ?traZODone (DESYREL) 50 MG tablet Take 1 tablet (50 mg total) by mouth at bedtime as needed for sleep 03/23/21   Raulkar, Drema Pry, MD  ? ? ?Family History ?Family History  ?Problem Relation Age of Onset  ? Arthritis Mother   ? COPD Father   ? Stroke Father   ? Multiple sclerosis Sister   ? ? ?Social History ?Social History  ? ?Tobacco Use  ? Smoking status: Former  ?  Packs/day: 1.50  ?  Years: 29.00  ?  Pack years: 43.50  ?  Types: Cigarettes  ?  Quit date: 10/22/2020  ?  Years since quitting: 0.5  ? Smokeless tobacco: Never  ? Tobacco comments:  ?  Quit smoking September 2022.  Using Nicoderm patch  currently.  ?Vaping Use  ? Vaping Use: Never used  ?Substance Use Topics  ? Alcohol use: No  ? Drug use: No  ? ? ? ?Allergies   ?Aspirin and Garlic ? ? ?Review of Systems ?Review of Systems  ?HENT: Negative.    ?Re

## 2021-05-18 ENCOUNTER — Other Ambulatory Visit: Payer: Self-pay

## 2021-05-19 ENCOUNTER — Other Ambulatory Visit: Payer: Self-pay

## 2021-05-19 ENCOUNTER — Ambulatory Visit: Payer: Medicaid Other | Admitting: Nurse Practitioner

## 2021-05-19 VITALS — BP 130/80 | HR 71 | Temp 98.5°F

## 2021-05-19 DIAGNOSIS — H6122 Impacted cerumen, left ear: Secondary | ICD-10-CM

## 2021-05-19 DIAGNOSIS — Z89612 Acquired absence of left leg above knee: Secondary | ICD-10-CM

## 2021-05-19 DIAGNOSIS — I10 Essential (primary) hypertension: Secondary | ICD-10-CM | POA: Diagnosis not present

## 2021-05-19 DIAGNOSIS — H9311 Tinnitus, right ear: Secondary | ICD-10-CM | POA: Diagnosis not present

## 2021-05-19 DIAGNOSIS — R0989 Other specified symptoms and signs involving the circulatory and respiratory systems: Secondary | ICD-10-CM | POA: Diagnosis not present

## 2021-05-19 DIAGNOSIS — Z9989 Dependence on other enabling machines and devices: Secondary | ICD-10-CM

## 2021-05-19 DIAGNOSIS — Z23 Encounter for immunization: Secondary | ICD-10-CM

## 2021-05-19 DIAGNOSIS — R7303 Prediabetes: Secondary | ICD-10-CM

## 2021-05-19 DIAGNOSIS — E782 Mixed hyperlipidemia: Secondary | ICD-10-CM

## 2021-05-19 NOTE — Progress Notes (Signed)
?This visit occurred during the SARS-CoV-2 public health emergency.  Safety protocols were in place, including screening questions prior to the visit, additional usage of staff PPE, and extensive cleaning of exam room while observing appropriate contact time as indicated for disinfecting solutions. ? ?Subjective:  ?  ? Patient ID: Benjamin [name] , male    DOB: September 25, 1960 , 61 y.o.   MRN: [id number] ? ? ?Chief Complaint  ?Patient presents with  ? Tinnitus  ? ? ?HPI ? ?He reports having ringing to his right ear, for the last month. When he lays down it worsens, "hissing" sound. He is taking gabapentin given by [facility]. Gabapentin has been helpful. He is taking 600 mg  ?  ? ?Past Medical History:  ?Diagnosis Date  ? Back pain   ? Heavy smoker   ? Hyperlipidemia LDL goal <70   ? Hypertension   ? 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  ? Bilateral femoral artery disease  ?  ? ?Family History  ?Problem Relation Age of Onset  ? Arthritis Mother   ? COPD Father   ? Stroke Father   ? Multiple sclerosis Sister   ? ? ? ?Current Outpatient Medications:  ?  acetaminophen (TYLENOL) 325 MG tablet, Take 1-2 tablets (325-650 mg total) by mouth every 4 (four) hours as needed for mild pain., Disp: 200 tablet, Rfl: 0 ?  atorvastatin (LIPITOR) 20 MG tablet, Take 1 tablet (20 mg total) by mouth daily., Disp: 90 tablet, Rfl: 3 ?  carvedilol (COREG) 6.25 MG tablet, Take 1 tablet (6.25 mg total) by mouth 2 (two) times daily with a meal., Disp: 60 tablet, Rfl: 0 ?  clopidogrel (PLAVIX) 75 MG tablet, Take 1 tablet (75 mg total) by mouth daily., Disp: 30 tablet, Rfl: 0 ?  docusate sodium (COLACE) 100 MG capsule, Take 1 capsule (100 mg total) by mouth daily., Disp: 30 capsule, Rfl: 0 ?  gabapentin (NEURONTIN) 600 MG tablet, Take 1 tablet (600 mg total) by mouth 2 (two) times daily., Disp: 60 tablet, Rfl: 3 ?  guaiFENesin-dextromethorphan (ROBITUSSIN DM) 100-10 MG/5ML syrup, Take 15 mLs by mouth every 4 (four) hours as needed for  cough., Disp: 118 mL, Rfl: 0 ?  hydrOXYzine (ATARAX) 25 MG tablet, Take 1 tablet (25 mg total) by mouth at bedtime for anxiety, Disp: 30 tablet, Rfl: 0 ?  lactose free nutrition (BOOST) LIQD, Take 237 mLs by mouth 2 (two) times daily between meals., Disp: , Rfl:  ?  losartan (COZAAR) 25 MG tablet, Take 1 tablet (25 mg total) by mouth daily., Disp: 90 tablet, Rfl: 3 ?  methocarbamol (ROBAXIN) 500 MG tablet, Take 1 tablet (500 mg total) by mouth every 6 (six) hours as needed for muscle spasms., Disp: 30 tablet, Rfl: 0 ?  Multiple Vitamin (MULTIVITAMIN WITH MINERALS) TABS tablet, Take 1 tablet by mouth daily., Disp: , Rfl:  ?  senna (SENOKOT) 8.6 MG TABS tablet, Take 1 tablet (8.6 mg total) by mouth 2 (two) times daily., Disp: 60 tablet, Rfl: 0 ?  traZODone (DESYREL) 50 MG tablet, Take 1 tablet (50 mg total) by mouth at bedtime as needed for sleep, Disp: 30 tablet, Rfl: 0  ? ?Allergies  ?Allergen Reactions  ? Aspirin Nausea And Vomiting  ? Garlic Other (See Comments)  ?  sneezing  ?  ? ?Review of Systems  ?Constitutional: Negative.   ?Respiratory: Negative.    ?Musculoskeletal:   ?     Left AKA  ?Psychiatric/Behavioral: Negative.     ? ?  Today's Vitals  ? 05/19/21 1203  ?BP: 130/80  ?Pulse: 71  ?Temp: 98.5 ?F (36.9 ?C)  ?TempSrc: Oral  ? ?There is no height or weight on file to calculate BMI.  ? ?Objective:  ?Physical Exam ?Vitals reviewed.  ?Constitutional:   ?   General: He is not in acute distress. ?   Appearance: Normal appearance.  ?Cardiovascular:  ?   Pulses: Normal pulses.  ?   Heart sounds: Normal heart sounds. No murmur heard. ?Pulmonary:  ?   Effort: Pulmonary effort is normal. No respiratory distress.  ?   Breath sounds: Normal breath sounds.  ?Musculoskeletal:  ?   Comments: Left AKA, using wheelchair in the office  ?Skin: ?   Capillary Refill: Capillary refill takes less than 2 seconds.  ?Neurological:  ?   General: No focal deficit present.  ?   Mental Status: He is alert and oriented to person, place,  and time.  ?   Cranial Nerves: No cranial nerve deficit.  ?   Motor: No weakness.  ?Psychiatric:     ?   Mood and Affect: Mood normal.     ?   Behavior: Behavior normal.     ?   Thought Content: Thought content normal.     ?   Judgment: Judgment normal.  ?  ? ?   ?Assessment And Plan:  ?   ?1. Tinnitus of right ear ?Comments: Will refer to ENT ?- US Carotid Duplex Bilateral; Future ? ?2. Decreased carotid pulse ?Comments: I am ordering him a carotid doppler to evaluate further ?- US Carotid Duplex Bilateral; Future ? ?3. Uncontrolled hypertension ?Comments: Slightly elevated but not too bad, continue current medications ? ?4. Mixed hyperlipidemia ?Comments: Continue statin, tolerating well  ? ?5. Prediabetes ? ?6. S/P AKA (above knee amputation), left (HCC) ? ?7. Impacted cerumen of left ear ?Comments: hard cerumen present to canal, encouraged to avoid using qtips.  ?- Ear Lavage ? ?8. Encounter for immunization ?Letitia Caul for tdap done ?Will give tetanus vaccine today while in office. Refer to order management. TDAP will be administered to adults 34-67 years old every 10 years. ?- Tdap vaccine greater than or equal to 7yo IM ?  ? ? ?Patient was given opportunity to ask questions. Patient verbalized understanding of the plan and was able to repeat key elements of the plan. All questions were answered to their satisfaction.  ?Arnette Felts, FNP  ? ?I, Arnette Felts, FNP, have reviewed all documentation for this visit. The documentation on 05/27/21 for the exam, diagnosis, procedures, and orders are all accurate and complete.  ? ?IF YOU HAVE BEEN REFERRED TO A SPECIALIST, IT MAY TAKE 1-2 WEEKS TO SCHEDULE/PROCESS THE REFERRAL. IF YOU HAVE NOT HEARD FROM US/SPECIALIST IN TWO WEEKS, PLEASE GIVE [name] A CALL AT [phone] X [phone].  ? ?THE PATIENT IS ENCOURAGED TO PRACTICE SOCIAL DISTANCING DUE TO THE COVID-19 PANDEMIC.   ?

## 2021-05-19 NOTE — Patient Instructions (Signed)
Tinnitus Tinnitus refers to hearing a sound when there is no actual source for that sound. This is often described as ringing in the ears. However, people with this condition may hear a variety of noises, in one ear or in both ears. The sounds of tinnitus can be soft, loud, or somewhere in between. Tinnitus can last for a few seconds or can be constant for days. It may go away without treatment and come back at various times. When tinnitus is constant or happens often, it can lead to other problems, such as trouble sleeping and trouble concentrating. Almost everyone experiences tinnitus at some point. Tinnitus is not the same as hearing loss. Tinnitus that is long-lasting (chronic) or comes back often (recurs) may require medical attention. What are the causes? The cause of tinnitus is often not known. In some cases, it can result from: Exposure to loud noises from machinery, music, or other sources. An object (foreign body) stuck in the ear. Earwax buildup. Drinking alcohol or caffeine. Taking certain medicines. Age-related hearing loss. It may also be caused by medical conditions such as: Ear or sinus infections. Heart diseases or high blood pressure. Allergies. Mnire's disease. Thyroid problems. Tumors. A weak, bulging blood vessel (aneurysm) near the ear. What increases the risk? The following factors may make you more likely to develop this condition: Exposure to loud noises. Age. Tinnitus is more likely in older individuals. Using alcohol or tobacco. What are the signs or symptoms? The main symptom of tinnitus is hearing a sound when there is no source for that sound. It may sound like: Buzzing. Sizzling. Ringing. Blowing air. Hissing. Whistling. Other sounds may include: Roaring. Running water. A musical note. Tapping. Humming. Symptoms may affect only one ear (unilateral) or both ears (bilateral). How is this diagnosed? Tinnitus is diagnosed based on your symptoms,  your medical history, and a physical exam. Your health care provider may do a thorough hearing test (audiologic exam) if your tinnitus: Is unilateral. Causes hearing difficulties. Lasts 6 months or longer. You may work with a health care provider who specializes in hearing disorders (audiologist). You may be asked questions about your symptoms and how they affect your daily life. You may have other tests done, such as: CT scan. MRI. An imaging test of how blood flows through your blood vessels (angiogram). How is this treated? Treating an underlying medical condition can sometimes make tinnitus go away. If your tinnitus continues, other treatments may include: Therapy and counseling to help you manage the stress of living with tinnitus. Sound generators to mask the tinnitus. These include: Tabletop sound machines that play relaxing sounds to help you fall asleep. Wearable devices that fit in your ear and play sounds or music. Acoustic neural stimulation. This involves using headphones to listen to music that contains an auditory signal. Over time, listening to this signal may change some pathways in your brain and make you less sensitive to tinnitus. This treatment is used for very severe cases when no other treatment is working. Using hearing aids or cochlear implants if your tinnitus is related to hearing loss. Hearing aids are worn in the outer ear. Cochlear implants are surgically placed in the inner ear. Follow these instructions at home: Managing symptoms   When possible, avoid being in loud places and being exposed to loud sounds. Wear hearing protection, such as earplugs, when you are exposed to loud noises. Use a white noise machine, a humidifier, or other devices to mask the sound of tinnitus. Practice techniques   for reducing stress, such as meditation, yoga, or deep breathing. Work with your health care provider if you need help with managing stress. Sleep with your head slightly  raised. This may reduce the impact of tinnitus. General instructions Do not use stimulants, such as nicotine, alcohol, or caffeine. Talk with your health care provider about other stimulants to avoid. Stimulants are substances that can make you feel alert and attentive by increasing certain activities in the body (such as heart rate and blood pressure). These substances may make tinnitus worse. Take over-the-counter and prescription medicines only as told by your health care provider. Try to get plenty of sleep each night. Keep all follow-up visits. This is important. Contact a health care provider if: Your tinnitus continues for 3 weeks or longer without stopping. You develop sudden hearing loss. Your symptoms get worse or do not get better with home care. You feel you are not able to manage the stress of living with tinnitus. Get help right away if: You develop tinnitus after a head injury. You have tinnitus along with any of the following: Dizziness. Nausea and vomiting. Loss of balance. Sudden, severe headache. Vision changes. Facial weakness or weakness of arms or legs. These symptoms may represent a serious problem that is an emergency. Do not wait to see if the symptoms will go away. Get medical help right away. Call your local emergency services (911 in the [place] Do not drive yourself to the hospital. Summary Tinnitus refers to hearing a sound when there is no actual source for that sound. This is often described as ringing in the ears. Symptoms may affect only one ear (unilateral) or both ears (bilateral). Use a white noise machine, a humidifier, or other devices to mask the sound of tinnitus. Do not use stimulants, such as nicotine, alcohol, or caffeine. These substances may make tinnitus worse. This information is not intended to replace advice given to you by your health care provider. Make sure you discuss any questions you have with your health care provider. Document  Revised: 01/13/2020 Document Reviewed: 01/13/2020 Elsevier Patient Education  2022 Elsevier Inc.  

## 2021-05-24 ENCOUNTER — Ambulatory Visit
Admission: RE | Admit: 2021-05-24 | Discharge: 2021-05-24 | Disposition: A | Discharge status: Home / Self Care | Payer: Medicaid Other | Source: Ambulatory Visit | Attending: Nurse Practitioner | Admitting: Nurse Practitioner

## 2021-05-24 DIAGNOSIS — R0989 Other specified symptoms and signs involving the circulatory and respiratory systems: Secondary | ICD-10-CM

## 2021-05-24 DIAGNOSIS — H9311 Tinnitus, right ear: Secondary | ICD-10-CM

## 2021-05-25 ENCOUNTER — Ambulatory Visit: Payer: MEDICAID | Admitting: Nurse Practitioner

## 2021-05-27 ENCOUNTER — Encounter: Payer: Self-pay | Admitting: Nurse Practitioner

## 2021-05-31 ENCOUNTER — Other Ambulatory Visit: Payer: Self-pay

## 2021-05-31 DIAGNOSIS — R0989 Other specified symptoms and signs involving the circulatory and respiratory systems: Secondary | ICD-10-CM

## 2021-05-31 DIAGNOSIS — H9311 Tinnitus, right ear: Secondary | ICD-10-CM

## 2021-06-01 ENCOUNTER — Other Ambulatory Visit (INDEPENDENT_AMBULATORY_CARE_PROVIDER_SITE_OTHER): Payer: Medicaid Other | Admitting: Nurse Practitioner

## 2021-06-01 ENCOUNTER — Telehealth: Payer: Self-pay

## 2021-06-01 ENCOUNTER — Other Ambulatory Visit: Payer: Self-pay | Admitting: Nurse Practitioner

## 2021-06-01 ENCOUNTER — Other Ambulatory Visit: Payer: Self-pay

## 2021-06-01 ENCOUNTER — Emergency Department (HOSPITAL_COMMUNITY)
Admission: EM | Admit: 2021-06-01 | Discharge: 2021-06-01 | Disposition: A | Discharge status: Home / Self Care | Payer: Medicaid Other | Attending: Emergency Medicine | Admitting: Emergency Medicine

## 2021-06-01 ENCOUNTER — Encounter (HOSPITAL_COMMUNITY): Payer: Self-pay | Admitting: Emergency Medicine

## 2021-06-01 DIAGNOSIS — I10 Essential (primary) hypertension: Secondary | ICD-10-CM | POA: Diagnosis not present

## 2021-06-01 DIAGNOSIS — H9312 Tinnitus, left ear: Secondary | ICD-10-CM | POA: Diagnosis present

## 2021-06-01 DIAGNOSIS — H9319 Tinnitus, unspecified ear: Secondary | ICD-10-CM

## 2021-06-01 DIAGNOSIS — Z7902 Long term (current) use of antithrombotics/antiplatelets: Secondary | ICD-10-CM | POA: Insufficient documentation

## 2021-06-01 DIAGNOSIS — Z89612 Acquired absence of left leg above knee: Secondary | ICD-10-CM

## 2021-06-01 DIAGNOSIS — Z79899 Other long term (current) drug therapy: Secondary | ICD-10-CM | POA: Insufficient documentation

## 2021-06-01 LAB — CBC WITH DIFFERENTIAL/PLATELET
Abs Immature Granulocytes: 0.02 10*3/uL (ref 0.00–0.07)
Basophils Absolute: 0 10*3/uL (ref 0.0–0.1)
Basophils Relative: 1 %
Eosinophils Absolute: 0 10*3/uL (ref 0.0–0.5)
Eosinophils Relative: 1 %
HCT: 36.7 % — ABNORMAL LOW (ref 39.0–52.0)
Hemoglobin: 12.1 g/dL — ABNORMAL LOW (ref 13.0–17.0)
Immature Granulocytes: 0 %
Lymphocytes Relative: 21 %
Lymphs Abs: 1.2 10*3/uL (ref 0.7–4.0)
MCH: 22.1 pg — ABNORMAL LOW (ref 26.0–34.0)
MCHC: 33 g/dL (ref 30.0–36.0)
MCV: 67.1 fL — ABNORMAL LOW (ref 80.0–100.0)
Monocytes Absolute: 0.4 10*3/uL (ref 0.1–1.0)
Monocytes Relative: 6 %
Neutro Abs: 4.2 10*3/uL (ref 1.7–7.7)
Neutrophils Relative %: 71 %
Platelets: 354 10*3/uL (ref 150–400)
RBC: 5.47 MIL/uL (ref 4.22–5.81)
RDW: 19.9 % — ABNORMAL HIGH (ref 11.5–15.5)
WBC: 5.9 10*3/uL (ref 4.0–10.5)
nRBC: 0 % (ref 0.0–0.2)

## 2021-06-01 LAB — COMPREHENSIVE METABOLIC PANEL
ALT: 13 U/L (ref 0–44)
AST: 20 U/L (ref 15–41)
Albumin: 4.1 g/dL (ref 3.5–5.0)
Alkaline Phosphatase: 134 U/L — ABNORMAL HIGH (ref 38–126)
Anion gap: 8 (ref 5–15)
BUN: 9 mg/dL (ref 8–23)
CO2: 22 mmol/L (ref 22–32)
Calcium: 9.2 mg/dL (ref 8.9–10.3)
Chloride: 106 mmol/L (ref 98–111)
Creatinine, Ser: 0.8 mg/dL (ref 0.61–1.24)
GFR, Estimated: >60 mL/min (ref >60)
Glucose, Bld: 114 mg/dL — ABNORMAL HIGH (ref 70–99)
Potassium: 3.4 mmol/L — ABNORMAL LOW (ref 3.5–5.1)
Sodium: 136 mmol/L (ref 135–145)
Total Bilirubin: 0.9 mg/dL (ref 0.3–1.2)
Total Protein: 7.2 g/dL (ref 6.5–8.1)

## 2021-06-01 MED ORDER — CARVEDILOL 6.25 MG PO TABS
6.2500 mg | ORAL_TABLET | Freq: Two times a day (BID) | ORAL | 0 refills | Status: DC
  Filled 2021-06-01: qty 60, 30d supply, fill #0

## 2021-06-01 MED ORDER — HYDROXYZINE HCL 25 MG PO TABS
25.0000 mg | ORAL_TABLET | Freq: Every day | ORAL | 1 refills | Status: DC
Start: 2021-06-01 — End: 2021-06-28
  Filled 2021-06-01: qty 30, 30d supply, fill #0

## 2021-06-01 MED ORDER — LOSARTAN POTASSIUM 25 MG PO TABS
25.0000 mg | ORAL_TABLET | Freq: Every day | ORAL | 0 refills | Status: DC
  Filled 2021-06-01: qty 30, 30d supply, fill #0

## 2021-06-01 NOTE — Patient Instructions (Signed)
Visit Information ? ?Mr. Wiacek was given information about Medicaid Managed Care team care coordination services as a part of their Healthy [facility] Medicaid benefit. [name] verbally consented to engagement with the [facility] Managed Care team.  ? ?If you are experiencing a medical emergency, please call 911 or report to your local emergency department or urgent care.  ? ?If you have a non-emergency medical problem during routine business hours, please contact your provider's office and ask to speak with a nurse.  ? ?For questions related to your Healthy [facility] health plan, please call: [phone] or visit the homepage here: [email or web address] ? ?If you would like to schedule transportation through your Healthy [facility] plan, please call the following number at least 2 days in advance of your appointment: [phone] ? For information about your ride after you set it up, call Ride Assist at [phone]. Use this number to activate a Will Call pickup, or if your transportation is late for a scheduled pickup. Use this number, too, if you need to make a change or cancel a previously scheduled reservation. ? If you need transportation services right away, call [phone]. The after-hours call center is staffed 24 hours to handle ride assistance and urgent reservation requests (including discharges) 365 days a year. Urgent trips include sick visits, hospital discharge requests and life-sustaining treatment. ? ?Call the [facility] Line at [phone], at any time, 24 hours a day, 7 days a week. If you are in danger or need immediate medical attention call 911. ? ?If you would like help to quit smoking, call 1-800-QUIT-NOW (407-484-5468) OR Espa?ol: 1-855-D?jelo-Ya 845-231-6807) o para m?s informaci?n haga clic aqu? or Text READY to 200-400 to register via text ? ?Mr. Masoud received a return telephone call today from this Medical illustrator  today. ? ?Today, he patient stated that he continues to have ringing in his ears.  He purchased an OTC product, Lipo-Flavonoid, that is advertised to help with Tinnitus.  Patient has been taking 2 tablets TID and states Lipo-Flavonoid has been "slowing down" the ringing in the ears.  Patient feels the product is helpful.  I reviewed the make up of Lipo-Flavonoid with the patient since patient was concerned it may be causing his blood pressure to increase.  There was no evidence that the product would increase his blood pressure.    ?I explained that PCP has placed an ENT referral yesterday and someone should be contacting him within the the next few to schedule an appointment. Patient verbalized understanding. ? ?The Managed Medicaid care management team will reach out to the patient again over the next 14 days.  ? ?Virgina Norfolk RN, BSN ?Community Care Coordinator ?[place]  [facility] ?Mobile: [phone]  ? ?Following is a copy of your plan of care:  ?There are no care plans that you recently modified to display for this patient. ? ?  ?

## 2021-06-01 NOTE — Patient Outreach (Signed)
Care Coordination ? ?06/01/2021 ? ?Benjamin [name] ?08-Jul-1960 ?[id number] ? ?This RN Care Manager returned patient's caregiver, Shiquan Mathieu (sister), telephone call at 3:45 pm this afternoon.   ? ?Patient's sister reports patient was getting "mixed up' on his medications which may have caused his blood pressure to become elevated.  Patient did go to ED earlier today because he was concerned with his blood pressure being high.  Patient discharged to home with no new medications, but two current medications refilled. ? ?Patient and patient's sister reviewed the ED Discharge Instructions regarding current medications after patient arrived home and sister had a few questions regarding patient's medications: ?Carvedilol (Coreg) - questioning if it should be taken 2 times/day.  I confirmed it is ordered 1 tablet 2 times/days with a meal. ?Clopidogrel (Plavix) - questioning if it should be taken daily.  I confirmed it is ordered 1 tablet daily. ?Losartan - patient had 2 bottles of this medication. Confirmed it is ordered 1 tablet daily.  Sister condensed 2 bottles of same medication into one bottle. ?Pantoprazole - questioning if patient should be taking.  Patient has a bottle of it from 2022, but not on DC Instruction sheet.  I confirmed this medication is no longer on current medication list. ? ?Patient's sister stated she is going to be the primary person overseeing the patient's medications.  [name] plans on "taking the medicine' to the patient each day and keep patient's medication bottle in her bedroom.  [name] said she would give patient his medication similar to how they administered medications at the nursing home. ? ?Patient and sister asking about follow up for patient's out-patient physical therapy.  I reviewed patient chart for notes pertaining to out-patient physical therapy.  This RN Care Manager plans on following up with both [facility] where prosthetic leg was provided and [facility] out-patient therapy tomorrow  to learn status of therapy. ? ?Virgina Norfolk RN, BSN ?Community Care Coordinator ?[place]  [facility] ?Mobile: [phone]  ?

## 2021-06-01 NOTE — Progress Notes (Addendum)
Received multiple calls from patient on call since 445 am about the ringing in his ear, explained to him his referral to ENT is pending and will try to get referral specialist to work on today. Will also send refill for hydroxyzine for anxiety which may help. He called back and reported his blood pressure was 170/100 he has not taken his blood pressure medication for the last week due to the ringing in his ear and the ear drops he was using he did not want to mix them. I have advised him to take his carvedilol this morning and then take losartan in one hour repeat blood pressure and return call to office. His sister returned call due to she is confused on what he needs to do. I explained everything to her, I have also explained to her that I have placed an order for a home health nurse to visit to check his medications, referral also placed to [facility] (nurse, SW and pharmacist). I have expressed to both the patient and his sister that  I am concerned that he may not understand fully how to care for himself at home primarily related to the medications and he may need a higher level of care, I am hoping if he can get a [place] and potentially a PCA to assist this may help keep him at home. Unfortunately his sister also suffers medical issues. I have also advised to make sure to take the clopidrogrel daily for his PAD.  ? ? ? ?[name], DNP, FNP-BC ? ? ?Spoke with patient for 11.24 minutes, in addition to his sister.  ? ?

## 2021-06-01 NOTE — ED Triage Notes (Signed)
Patient here with complaint of hypertension that he first discovered today. Patient was complaining of ringing in his ears and slight lightheadedness and his sister checked his blood pressure and found it to be high. Patient states she was taking antihypertensives until he had his left leg amputated at which point he stopped. ?

## 2021-06-01 NOTE — Patient Instructions (Signed)
Visit Information ? ?Benjamin [name] was given information about Medicaid Managed Care team care coordination services as a part of their Healthy [facility] Medicaid benefit. Benjamin [name] verbally consented to engagement with the [facility] Managed Care team.  ? ?If you are experiencing a medical emergency, please call 911 or report to your local emergency department or urgent care.  ? ?If you have a non-emergency medical problem during routine business hours, please contact your provider's office and ask to speak with a nurse.  ? ?For questions related to your Healthy [facility] health plan, please call: [phone] or visit the homepage here: [email or web address] ? ?If you would like to schedule transportation through your Healthy [facility] plan, please call the following number at least 2 days in advance of your appointment: [phone] ? For information about your ride after you set it up, call Ride Assist at [phone]. Use this number to activate a Will Call pickup, or if your transportation is late for a scheduled pickup. Use this number, too, if you need to make a change or cancel a previously scheduled reservation. ? If you need transportation services right away, call [phone]. The after-hours call center is staffed 24 hours to handle ride assistance and urgent reservation requests (including discharges) 365 days a year. Urgent trips include sick visits, hospital discharge requests and life-sustaining treatment. ? ?Call the [facility] Line at [phone], at any time, 24 hours a day, 7 days a week. If you are in danger or need immediate medical attention call 911. ? ?If you would like help to quit smoking, call 1-800-QUIT-NOW (619-027-8046) OR Espa?ol: 1-855-D?jelo-Ya 605-702-7802) o para m?s informaci?n haga clic aqu? or Text READY to 200-400 to register via text ? ?This RN Care Manager returned patient's caregiver, Benjamin [name] (sister), telephone  call at 3:45 pm this afternoon.   ? ?Patient's sister reports patient was getting "mixed up' on his medications which may have caused his blood pressure to become elevated.  Patient did go to ED earlier today because he was concerned with his blood pressure being high.  Patient discharged to home with no new medications, but two current medications refilled. ? ?Patient and patient's sister reviewed the ED Discharge Instructions regarding current medications after patient arrived home and sister had a few questions regarding patient's medications: ?Carvedilol (Coreg) - questioning if it should be taken 2 times/day.  I confirmed it is ordered 1 tablet 2 times/days with a meal. ?Clopidogrel (Plavix) - questioning if it should be taken daily.  I confirmed it is ordered 1 tablet daily. ?Losartan - patient had 2 bottles of this medication. Confirmed it is ordered 1 tablet daily.  Sister condensed 2 bottles of same medication into one bottle. ?Pantoprazole - questioning if patient should be taking.  Patient has a bottle of it from 2022, but not on DC Instruction sheet.  I confirmed this medication is no longer on current medication list. ? ?Patient's sister stated she is going to be the primary person overseeing the patient's medications.  Benjamin [name] plans on "taking the medicine' to the patient each day and keep patient's medication bottle in her bedroom.  Benjamin [name] said she would give patient his medication similar to how they administered medications at the nursing home. ? ?Patient and sister asking about follow up for patient's out-patient physical therapy.  I reviewed patient chart for notes pertaining to out-patient physical therapy.  This RN Care Manager plans on following up with both [facility] where prosthetic leg was provided and [facility]  out-patient therapy tomorrow to learn status of therapy. ? ?The Managed Medicaid care management team will reach out to the patient again over the next 14 days.  ? ?Virgina Norfolk  RN, BSN ?Community Care Coordinator ?[place]  [facility] ?Mobile: [phone]  ? ?Following is a copy of your plan of care:  ?There are no care plans that you recently modified to display for this patient. ? ?  ?

## 2021-06-01 NOTE — ED Provider Triage Note (Signed)
Emergency Medicine Provider Triage Evaluation Note ? ?Benjamin [name] , a 60 y.o. male  was evaluated in triage.  Pt complains of ringing of his ear and HTN.  He has had ringing in the left ear.  For about a month ? ?He reports he decided to get evaluated today due to the ringing affecting his sleep.  ? ?He is not taking antihypertensives per his report.  ? ?He has been taking Lipo flavonoid plus  supplement to try and help with the ringing which he feels like is helping.  ? ?He had a carotid US earlier this month from his PCP for this.  ? ?No CP or shortness of breath ? ?Physical Exam  ?BP (!) 180/106 (BP Location: Right Arm)   Pulse 77   Temp 98.5 ?F (36.9 ?C) (Oral)   Resp 16   SpO2 97%  ?Gen:   Awake, no distress   ?Resp:  Normal effort  ?MSK:   Moves extremities without difficulty  ?Other:   ? ?Medical Decision Making  ?Medically screening exam initiated at 1:26 PM.  Appropriate orders placed.  Benjamin [name] was informed that the remainder of the evaluation will be completed by another provider, this initial triage assessment does not replace that evaluation, and the importance of remaining in the ED until their evaluation is complete. ? ?Will check basic labs.  ?  ?Cristina Gong, PA-C ?06/01/21 1330 ? ?

## 2021-06-01 NOTE — ED Provider Notes (Signed)
?[name] EMERGENCY DEPARTMENT ?Provider Note ? ? ?CSN: [id number] ?Arrival date & time: 06/01/21  1249 ? ?  ? ?History ? ?Chief Complaint  ?Patient presents with  ? Hypertension  ? ? ?Benjamin [name] is a 61 y.o. male presenting to the ED with complaint of high blood pressure and ringing in his left ear.  The patient reports that he has not been taking his blood pressure medication so he cannot clarify why.  He is prescribed Coreg twice a day and Cozaar.  He does need a refill on these medicines.  He reports that he has been having some ringing in his left ear for the past several nights, which is constant.  He denies hearing loss.  He denies significant headache.  He has no other complaints ? ?Per my review the patient had a carotid ultrasound performed earlier this month approximately 1 week ago, which showed no hemodynamically significant stenosis by duplex criteria ? ?HPI ? ?  ? ?Home Medications ?Prior to Admission medications   ?Medication Sig Start Date End Date Taking? Authorizing Provider  ?carvedilol (COREG) 6.25 MG tablet Take 1 tablet (6.25 mg total) by mouth 2 (two) times daily with a meal. 06/01/21 07/31/21 Yes Takera Rayl, Kermit Balo, MD  ?losartan (COZAAR) 25 MG tablet Take 1 tablet (25 mg total) by mouth daily. 06/01/21 07/31/21 Yes Leaman Abe, Kermit Balo, MD  ?acetaminophen (TYLENOL) 325 MG tablet Take 1-2 tablets (325-650 mg total) by mouth every 4 (four) hours as needed for mild pain. 12/25/20   Love, Evlyn Kanner, PA-C  ?atorvastatin (LIPITOR) 20 MG tablet Take 1 tablet (20 mg total) by mouth daily. 03/23/21   Raulkar, Drema Pry, MD  ?clopidogrel (PLAVIX) 75 MG tablet Take 1 tablet (75 mg total) by mouth daily. 05/11/21     ?docusate sodium (COLACE) 100 MG capsule Take 1 capsule (100 mg total) by mouth daily. 03/16/21   Raulkar, Drema Pry, MD  ?gabapentin (NEURONTIN) 600 MG tablet Take 1 tablet (600 mg total) by mouth 2 (two) times daily. 03/22/21   Raulkar, Drema Pry, MD   ?guaiFENesin-dextromethorphan (ROBITUSSIN DM) 100-10 MG/5ML syrup Take 15 mLs by mouth every 4 (four) hours as needed for cough. 12/15/20   Lorin Glass, MD  ?hydrOXYzine (ATARAX) 25 MG tablet Take 1 tablet (25 mg total) by mouth at bedtime for anxiety 06/01/21   Arnette Felts, FNP  ?lactose free nutrition (BOOST) LIQD Take 237 mLs by mouth 2 (two) times daily between meals.    [provider]  ?losartan (COZAAR) 25 MG tablet Take 1 tablet (25 mg total) by mouth daily. 03/23/21   Raulkar, Drema Pry, MD  ?methocarbamol (ROBAXIN) 500 MG tablet Take 1 tablet (500 mg total) by mouth every 6 (six) hours as needed for muscle spasms. 12/25/20   Jacquelynn Cree, PA-C  ?Multiple Vitamin (MULTIVITAMIN WITH MINERALS) TABS tablet Take 1 tablet by mouth daily. 12/16/20   Lorin Glass, MD  ?senna (SENOKOT) 8.6 MG TABS tablet Take 1 tablet (8.6 mg total) by mouth 2 (two) times daily. 12/25/20   Love, Evlyn Kanner, PA-C  ?traZODone (DESYREL) 50 MG tablet Take 1 tablet (50 mg total) by mouth at bedtime as needed for sleep 03/23/21   Raulkar, Drema Pry, MD  ?   ? ?Allergies    ?Aspirin and Garlic   ? ?Review of Systems   ?Review of Systems ? ?Physical Exam ?Updated Vital Signs ?BP (!) 168/97   Pulse 80   Temp 97.7 ?F (36.5 ?C) (Oral)  Resp 18   SpO2 97%  ?Physical Exam ?Constitutional:   ?   General: He is not in acute distress. ?HENT:  ?   Head: Normocephalic and atraumatic.  ?Eyes:  ?   Conjunctiva/sclera: Conjunctivae normal.  ?   Pupils: Pupils are equal, round, and reactive to light.  ?Cardiovascular:  ?   Rate and Rhythm: Normal rate and regular rhythm.  ?   Pulses: Normal pulses.  ?Pulmonary:  ?   Effort: Pulmonary effort is normal. No respiratory distress.  ?Abdominal:  ?   General: There is no distension.  ?   Tenderness: There is no abdominal tenderness.  ?Skin: ?   General: Skin is warm and dry.  ?Neurological:  ?   General: No focal deficit present.  ?   Mental Status: He is alert and oriented to person, place, and  time. Mental status is at baseline.  ?   Sensory: No sensory deficit.  ?   Motor: No weakness.  ?Psychiatric:     ?   Mood and Affect: Mood normal.     ?   Behavior: Behavior normal.  ? ? ?ED Results / Procedures / Treatments   ?Labs ?(all labs ordered are listed, but only abnormal results are displayed) ?Labs Reviewed  ?COMPREHENSIVE METABOLIC PANEL - Abnormal; Notable for the following components:  ?    Result Value  ? Potassium 3.4 (*)   ? Glucose, Bld 114 (*)   ? Alkaline Phosphatase 134 (*)   ? All other components within normal limits  ?CBC WITH DIFFERENTIAL/PLATELET - Abnormal; Notable for the following components:  ? Hemoglobin 12.1 (*)   ? HCT 36.7 (*)   ? MCV 67.1 (*)   ? MCH 22.1 (*)   ? RDW 19.9 (*)   ? All other components within normal limits  ? ? ?EKG ?None ? ?Radiology ?No results found. ? ?Procedures ?Procedures  ? ? ?Medications Ordered in ED ?Medications - No data to display ? ?ED Course/ Medical Decision Making/ A&P ?  ?                        ?Medical Decision Making ?Risk ?Prescription drug management. ? ? ?Patient is here with suspected ringing in the left ear which may be tinnitus, with a low suspicion for stroke or intravascular injury.  I do not see evidence of ear infection, doubt M?ni?re's disease.  He has no balance issues or hearing loss.   ? ?He is also hypertensive, will refill his blood pressure medications and advised that he pick him up from the pharmacy.  Advised that if he continues having ringing in his ear despite improvement of his blood pressure, he can follow-up with an ENT doctor.  He verbalized understanding. ? ?Otherwise a low suspicion for other life-threatening medical emergency.  Do not believe we need emergent CT imaging of the brain.  I personally reviewed and interpreted his triage labs, which were largely unremarkable and close to baseline levels.  No significant derangement of electrolytes noted here today. ? ?No active chest pain or pressure to suggest acute  coronary syndrome.  He is stable for discharge. ? ? ? ? ? ? ? ?Final Clinical Impression(s) / ED Diagnoses ?Final diagnoses:  ?Hypertension, unspecified type  ?Tinnitus of left ear  ? ? ?Rx / DC Orders ?ED Discharge Orders   ? ?      Ordered  ?  losartan (COZAAR) 25 MG tablet  Daily       ?  06/01/21 1548  ?  carvedilol (COREG) 6.25 MG tablet  2 times daily with meals       ? 06/01/21 1548  ? ?  ?  ? ?  ? ? ?  ?Terald Sleeperrifan, Desmen Schoffstall J, MD ?06/01/21 1555 ? ?

## 2021-06-01 NOTE — Patient Outreach (Addendum)
Care Coordination ? ?06/01/2021 ? ?Benjamin [name] ?Jul 07, 1960 ?[id number] ? ? ?This RN Care Manager placed a telephone call at 10:45 am to patient in order to return voicemail messages received on 05/28/2021 and 05/31/2021.  I regret that I was not working on both these dates. ? ?Today, he patient stated that he continues to have ringing in his ears.  He purchased an OTC product, Lipo-Flavonoid, that is advertised to help with Tinnitus.  Patient has been taking 2 tablets TID and states Lipo-Flavonoid has been "slowing down" the ringing in the ears.  Patient feels the product is helpful.  I reviewed the make up of Lipo-Flavonoid with the patient since patient was concerned it may be causing his blood pressure to increase.  There was no evidence that the product would increase his blood pressure.    ?I explained that PCP has placed an ENT referral yesterday and someone should be contacting him within the the next few to schedule an appointment. Patient verbalized understanding. ? ?Virgina Norfolk RN, BSN ?Community Care Coordinator ?[place]  [facility] ?Mobile: [phone]  ? ?

## 2021-06-01 NOTE — Telephone Encounter (Signed)
Patient called with repeat blood pressure reading. His sister states that his bp is now 213/114 and he is light-headed. Advised sister to drive him to the ED or call EMS. She states that he could drive himself since they live so close.  ?

## 2021-06-02 ENCOUNTER — Telehealth: Payer: Self-pay

## 2021-06-02 ENCOUNTER — Telehealth: Payer: Self-pay | Admitting: *Deleted

## 2021-06-02 NOTE — Patient Instructions (Signed)
Visit Information ? ?Benjamin [name] was given information about Medicaid Managed Care team care coordination services as a part of their Healthy [name] Medicaid benefit. Benjamin [name] verbally consented to engagement with the [name] Managed Care team.  ? ? ?Successful Telephone Contact today regarding status of out-patient physical therapy. ? ? ?This RN Care Manager placed a telephone call to Benjamin [name] on [name]. [phone]) to learn status of patient's out-patient physical therapy (PT).  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  Next, patient needs an order from Dr. Carlis Abbott for out-patient PT to begin. ?  ?I placed a telephone call to Dr. Lonell Face office and asked for MD order for out-patient PT Evaluation and Gait Training to be sent to [name] on [address]  MD's office staff said order will be faxed. ?  ?Next, I placed a telephone call to patient and his sister, Benjamin [name].  I informed them of the above information and provided patient with [name] phone number so he can call them if he does not hear from them regarding scheduling an appointment.  Patient and sister verbalized understanding. ?  ?Patient and sister stated that patient's new prosthetic leg appears to be longer than his other leg.  I suggested patient contact [facility] and discuss what needs to be done to address this concern.  Patient will follow up with [facility]. ? ? ?Virgina Norfolk RN, BSN ?Community Care Coordinator ?[place]  [facility] ?Mobile: [phone]  ? ?Following is a copy of your plan of care:  ?There are no care plans that you recently modified to display for this patient. ? ?  ?

## 2021-06-02 NOTE — Addendum Note (Signed)
Addended by: Arnette Felts F on: 06/02/2021 09:34 AM ? ? Modules accepted: Level of Service ? ?

## 2021-06-02 NOTE — Telephone Encounter (Signed)
Received a call from [facility] stating that Mr Benjamin [name] has received his prosthetic and they are needing new referrals sent to Outpt neuro rehab for his gate training and ADL with prosthetic.  Of note, he was only seen in January as hospital follow up and no mention of return visit and none scheduled.   ?

## 2021-06-02 NOTE — Patient Outreach (Signed)
Care Coordination ? ?06/02/2021 ? ?JOBIN MONTELONGO ?1960/12/30 ?[id number] ? ?This RN Care Manager placed a telephone call to [facility] on [facility]. [phone]) to learn status of patient's out-patient physical therapy (PT).  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  Next, patient needs an order from Dr. Carlis Abbott for out-patient PT to begin. ? ?I placed a telephone call to Dr. Lonell Face office and asked for MD order for out-patient PT Evaluation and Gait Training to be sent to [facility] on [address]  MD's office staff said order will be faxed. ? ?Next, I placed a telephone call to patient and his sister, Akram Kissick.  I informed them of the above information and provided patient with [facility] phone number so he can call them if he does not hear from them regarding scheduling an appointment.  Patient and sister verbalized understanding. ? ?Patient and sister stated that patient's new prosthetic leg appears to be longer than his other leg.  I suggested patient contact [facility] and discuss what needs to be done to address this concern.  Patient will follow up with [facility]. ? ?Virgina Norfolk RN, BSN ?Community Care Coordinator ?[place]  [facility] ?Mobile: [phone]  ? ? ?

## 2021-06-03 ENCOUNTER — Other Ambulatory Visit: Payer: Self-pay | Admitting: Physical Medicine and Rehabilitation

## 2021-06-03 DIAGNOSIS — S78112A Complete traumatic amputation at level between left hip and knee, initial encounter: Secondary | ICD-10-CM

## 2021-06-04 ENCOUNTER — Other Ambulatory Visit: Payer: Self-pay

## 2021-06-08 ENCOUNTER — Ambulatory Visit: Payer: Medicaid Other | Attending: Nurse Practitioner

## 2021-06-08 ENCOUNTER — Ambulatory Visit: Payer: Medicaid Other | Admitting: Occupational Therapy

## 2021-06-08 ENCOUNTER — Encounter: Payer: Self-pay | Admitting: Occupational Therapy

## 2021-06-08 DIAGNOSIS — R293 Abnormal posture: Secondary | ICD-10-CM | POA: Insufficient documentation

## 2021-06-08 DIAGNOSIS — R2689 Other abnormalities of gait and mobility: Secondary | ICD-10-CM | POA: Insufficient documentation

## 2021-06-08 DIAGNOSIS — Z89612 Acquired absence of left leg above knee: Secondary | ICD-10-CM | POA: Insufficient documentation

## 2021-06-08 DIAGNOSIS — M6281 Muscle weakness (generalized): Secondary | ICD-10-CM | POA: Insufficient documentation

## 2021-06-08 DIAGNOSIS — R2681 Unsteadiness on feet: Secondary | ICD-10-CM

## 2021-06-08 NOTE — Therapy (Addendum)
? ?OUTPATIENT PHYSICAL THERAPY PROSTHETICS EVALUATION ? ? ?Patient Name: Benjamin [name] ?MRN: [id number] ?DOB:[birth year], 61 y.o., male ?Today's Date: 06/08/2021 ? ?PCP: [name], FNP ?REFERRING PROVIDER: Izora Ribas, MD ? ? PT End of Session - 06/08/21 1200   ? ? Visit Number 1   ? Number of Visits 17   ? Date for PT Re-Evaluation 08/03/21   ? Authorization Type Medicaid   ? PT Start Time 0830   ? PT Stop Time 0930   ? PT Time Calculation (min) 60 min   ? Equipment Utilized During Treatment Gait belt   ? Activity Tolerance Patient tolerated treatment well   ? Behavior During Therapy [facility] for tasks assessed/performed   ? ?  ?  ? ?  ? ? ?Past Medical History:  ?Diagnosis Date  ? Back pain   ? Heavy smoker   ? Hyperlipidemia LDL goal <70   ? Hypertension   ? Peripheral arterial occlusive disease ([place]) 06/2013  ? Bilateral femoral artery disease  ? ?Past Surgical History:  ?Procedure Laterality Date  ? ABDOMINAL AORTOGRAM W/LOWER EXTREMITY N/A 07/12/2016  ? Procedure: Abdominal Aortogram w/Lower Extremity;  Surgeon: Serafina Mitchell, MD;  Location: [place] [facility];  Service: Cardiovascular;  Laterality: N/A;  Bilateral extermity: Patent Renal As. No sig Dz in infrarenal Abd Aorta. Normal Bilat Iliac arteries. R SFA is 100% @ origin - recon in AK-Pop A. R PT A patent. L CFA occluded. L PFA recon @ origin, L SFA occluded w/ recon in AK Pop A.  ? ABDOMINAL AORTOGRAM W/LOWER EXTREMITY N/A 10/15/2019  ? Procedure: ABDOMINAL AORTOGRAM W/LOWER EXTREMITY;  Surgeon: Serafina Mitchell, MD;  Location: [place] [facility];  Service: Cardiovascular;  Laterality: N/A;  ? AMPUTATION Left 10/04/2020  ? Procedure: LEFT ABOVE KNEE AMPUTATION;  Surgeon: Serafina Mitchell, MD;  Location: [place];  Service: Vascular;  Laterality: Left;  ? AMPUTATION Left 12/08/2020  ? Procedure: RIGHT ABOVE KNEE AMPUTATION REVISION;  Surgeon: Angelia Mould, MD;  Location: [facility] OR;  Service: Vascular;  Laterality: Left;  ? AORTA - BILATERAL  FEMORAL ARTERY BYPASS GRAFT N/A 05/08/2020  ? Procedure: AORTA BIFEMORAL BYPASS GRAFT;  Surgeon: Serafina Mitchell, MD;  Location: [facility] OR;  Service: Vascular;  Laterality: N/A;  ? COLONOSCOPY  06/2016  ? never  ? ENDARTERECTOMY FEMORAL Left 11/14/2019  ? Procedure: Left groin exploration, Redo left femoral artery exposure;  Surgeon: Serafina Mitchell, MD;  Location: [place];  Service: Vascular;  Laterality: Left;  ? FEMORAL-FEMORAL BYPASS GRAFT N/A 11/13/2019  ? Procedure: BYPASS GRAFT FEMORAL-FEMORAL ARTERY RIGHT TO LEFT USING HEMASHIELD GOLD GRAFT 15mm x 30cm;  Surgeon: Serafina Mitchell, MD;  Location: [facility] OR;  Service: Vascular;  Laterality: N/A;  ? FEMORAL-POPLITEAL BYPASS GRAFT Left 11/13/2019  ? Procedure: LEFT FEMORAL BELOW KNEE-POPLITEAL ARTERY USING NON-REVERSED GREATER SAPHENOUS VEIN;  Surgeon: Serafina Mitchell, MD;  [facility];  Service: Vascular;  Laterality: Left;  ? FEMORAL-POPLITEAL BYPASS GRAFT Left 05/08/2020  ? Procedure: LEFT REDO BYPASS GRAFT COMMON FEMORAL- BELOW KNEE POPLITEAL ARTERY USING PROPATEN GRAFT;  Surgeon: Serafina Mitchell, MD;  [facility]: [facility] OR;  Service: Vascular;  Laterality: Left;  ? I & D EXTREMITY Left 09/07/2020  ? Procedure: IRRIGATION AND DEBRIDEMENT LEFT LEG WITH EXCISION OF LEFT LEG DISTAL BYPASS GRAFT;  Surgeon: Marty Heck, MD;  Location: [place];  Service: Vascular;  Laterality: Left;  ? LOWER EXTREMITY ANGIOGRAM Left 11/14/2019  ? Procedure: LEFT LOWER EXTREMITY ANGIOGRAM,  BYPASS GRAFT ANGIOPLASTY;  Surgeon: Serafina Mitchell, MD;  Location: [facility] OR;  Service: Vascular;  Laterality: Left;  ? PERIPHERAL VASCULAR INTERVENTION  10/15/2019  ? Procedure: PERIPHERAL VASCULAR INTERVENTION;  Surgeon: Serafina Mitchell, MD;  Location: [place] [facility];  Service: Cardiovascular;;  Rt Iliac  ? REMOVAL OF GRAFT Left 10/04/2020  ? Procedure: REMOVAL OF LEFT FEMORAL TO POPITEAL BYPASS GRAFT;  Surgeon: Serafina Mitchell, MD;  Location: [place];  Service: Vascular;  Laterality: Left;   ? ?Patient Active Problem List  ? Diagnosis Date Noted  ? Acute blood loss anemia 12/25/2020  ? Hyponatremia 12/25/2020  ? 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 Unilateral AKA, left ([place]) 12/15/2020  ? Protein-calorie malnutrition, severe 12/09/2020  ? Wound cellulitis 12/07/2020  ? 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 Ischemia of lower extremity 10/01/2020  ? 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 Femoral-popliteal bypass graft occlusion, left ([place]) 11/13/2019  ? PAD (peripheral artery disease) ([place]) 11/13/2019  ? Callus of foot 07/01/2019  ? Intermittent claudication ([place]) 01/20/2017  ? Need for influenza vaccination 01/20/2017  ? Needs smoking cessation education 09/06/2016  ? Abnormal echocardiogram 09/05/2016  ? Abnormal EKG 07/25/2016  ? Preoperative cardiovascular examination 07/25/2016  ? Shortness of breath on exertion 07/25/2016  ? Dyslipidemia, goal LDL below 70 07/25/2016  ? High blood pressure 07/25/2016  ? Abnormal chest x-ray 07/04/2016  ? Peripheral vascular disease ([place]) 07/04/2016  ? Decreased pedal pulses 06/24/2016  ? 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 Heavy smoker 06/24/2016  ? Abnormal weight loss 06/24/2016  ? Foot pain, left 06/24/2016  ? Screening for prostate cancer 06/24/2016  ? ? ?ONSET DATE: 12/08/2020 ? ?REFERRING DIAG: [id number] (ICD-10-CM) - Unilateral AKA, left (HCC)  ? ?THERAPY DIAG:  ?Other abnormalities of gait and mobility - Plan: PT plan of care cert/re-cert ? ?Unsteadiness on feet - Plan: PT plan of care cert/re-cert ? ?Hx of AKA (above knee amputation), left ([place]) - Plan: PT plan of care cert/re-cert ? ?SUBJECTIVE:  ? ?SUBJECTIVE STATEMENT: ?Pt had initial AKA done on 10/04/2020 but he had non healing wound for which doctors had to revise his AKA to shorter version on 12/08/2020. He received his prosthesis from [place] and is working with Engineer, petroleum at [employer].  ?Pt accompanied by: self ? ?PERTINENT HISTORY: PMH significant for HTN, HLD, heavy smoking,  peripheral artery disease s/p bilateral femoral artery bypass graft  ? ? ?PAIN:  ?Are you having pain? No ? ? ?PRECAUTIONS: Fall ? ?WEIGHT BEARING RESTRICTIONS No ? ?FALLS: Has patient fallen in last 6 months? No ? ?LIVING ENVIRONMENT: ?Lives with: lives with their family and sister ?Lives in: House/apartment ?Home Access: Stairs to enter ?Home layout: One level ?Stairs: Yes: External: 4 steps; on right going up and on left going up ?Has following equipment at home: Gilford Rile - 2 wheeled ? ?PLOF: Independent ? ?PATIENT GOALS improve walking ? ?OBJECTIVE:  ? ?COGNITION: ?Overall cognitive status: Within functional limits for tasks assessed ?  ? ? ?BED MOBILITY:  ?Sit to supine Complete Independence ?Supine to sit Complete Independence ?Rolling to Right Complete Independence ?Rolling to Left Complete Independence ? ?TRANSFERS: ?Sit to stand: Modified independence ?Stand to sit: Modified independence ? ? ?GAIT: ?Gait pattern: decreased arm swing- Right, decreased arm swing- Left, decreased step length- Right, decreased stance time- Left, decreased stride length, decreased hip/knee flexion- Left, circumduction- Left, Left hip hike, lateral lean- Right, decreased trunk rotation, and wide BOS ?Distance walked: 34' ?Assistive device utilized:  Walker - 2 wheeled ?Level of assistance: CGA ?Gait velocity: 0.09 m/sec ?Comments: Patient educated on taking longer step with R and allowing weight to roll through L ball of foot to allow prosthesis to bend during mid to late stance phase on L ? ?FUNCTIONAL TESTs:  ?Timed up and go (TUG): 99 seconds with RW ?10 meter walk test: 0.77m/s with RW ? ?CARDIOVASCULAR RESPONSE: ?Functional activity: walking ? ?Post-activity vitals: ?BP: 191/109 ?HR: 97 ? ? ?CURRENT PROSTHETIC WEAR ASSESSMENT: ?Patient is independent with: skin check ?Patient is dependent with: residual limb care, prosthetic cleaning, ply sock cleaning, correct ply sock adjustment, proper wear schedule/adjustment, and proper  weight-bearing schedule/adjustment ?Donning prosthesis: Modified independence ?Doffing prosthesis: Complete Independence ?Prosthetic wear tolerance: 2 hours/2x day, 7 days/week ?Prosthetic weight bearing tolerance:

## 2021-06-08 NOTE — Therapy (Signed)
?OUTPATIENT OCCUPATIONAL THERAPY NEURO EVALUATION ? ?Patient Name: Benjamin [name] ?MRN: [id number] ?DOB:[birth year], 60 y.o., male ?Today's Date: 06/08/2021 ? ?PCP: Arnette Felts, FNP ?REFERRING PROVIDER: Horton Chin, MD ? ? OT End of Session - 06/08/21 1044   ? ? Visit Number 1   ? Number of Visits 17   ? Date for OT Re-Evaluation 08/03/21   ? Authorization Type [facility] [employer] - awaiting approval   ? OT Start Time [phone]   ? OT Stop Time 1015   ? OT Time Calculation (min) 40 min   ? Activity Tolerance Patient tolerated treatment well   ? Behavior During Therapy [facility] for tasks assessed/performed   ? ?  ?  ? ?  ? ? ?Past Medical History:  ?Diagnosis Date  ? Back pain   ? Heavy smoker   ? Hyperlipidemia LDL goal <70   ? Hypertension   ? 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  ? Bilateral femoral artery disease  ? ?Past Surgical History:  ?Procedure Laterality Date  ? ABDOMINAL AORTOGRAM W/LOWER EXTREMITY N/A 07/12/2016  ? Procedure: Abdominal Aortogram w/Lower Extremity;  Surgeon: Nada Libman, MD;  [facility];  Service: Cardiovascular;  Laterality: N/A;  Bilateral extermity: Patent Renal As. No sig Dz in infrarenal Abd Aorta. Normal Bilat Iliac arteries. R SFA is 100% @ origin - recon in AK-Pop A. R PT A patent. L CFA occluded. L PFA recon @ origin, L SFA occluded w/ recon in AK Pop A.  ? ABDOMINAL AORTOGRAM W/LOWER EXTREMITY N/A 10/15/2019  ? Procedure: ABDOMINAL AORTOGRAM W/LOWER EXTREMITY;  Surgeon: Nada Libman, MD;  [facility];  Service: Cardiovascular;  Laterality: N/A;  ? AMPUTATION Left 10/04/2020  ? Procedure: LEFT ABOVE KNEE AMPUTATION;  Surgeon: Nada Libman, MD;  Location: [facility] OR;  Service: Vascular;  Laterality: Left;  ? AMPUTATION Left 12/08/2020  ? Procedure: RIGHT ABOVE KNEE AMPUTATION REVISION;  Surgeon: Chuck Hint, MD;  Location: [facility] OR;  Service: Vascular;  Laterality: Left;  ? AORTA - BILATERAL FEMORAL ARTERY BYPASS GRAFT  N/A 05/08/2020  ? Procedure: AORTA BIFEMORAL BYPASS GRAFT;  Surgeon: Nada Libman, MD;  Location: [facility] OR;  Service: Vascular;  Laterality: N/A;  ? COLONOSCOPY  06/2016  ? never  ? ENDARTERECTOMY FEMORAL Left 11/14/2019  ? Procedure: Left groin exploration, Redo left femoral artery exposure;  Surgeon: Nada Libman, MD;  [facility];  Service: Vascular;  Laterality: Left;  ? FEMORAL-FEMORAL BYPASS GRAFT N/A 11/13/2019  ? Procedure: BYPASS GRAFT FEMORAL-FEMORAL ARTERY RIGHT TO LEFT USING HEMASHIELD GOLD GRAFT 39mm x 30cm;  Surgeon: Nada Libman, MD;  Location: [facility] OR;  Service: Vascular;  Laterality: N/A;  ? FEMORAL-POPLITEAL BYPASS GRAFT Left 11/13/2019  ? Procedure: LEFT FEMORAL BELOW KNEE-POPLITEAL ARTERY USING NON-REVERSED GREATER SAPHENOUS VEIN;  Surgeon: Nada Libman, MD;  [facility];  Service: Vascular;  Laterality: Left;  ? FEMORAL-POPLITEAL BYPASS GRAFT Left 05/08/2020  ? Procedure: LEFT REDO BYPASS GRAFT COMMON FEMORAL- BELOW KNEE POPLITEAL ARTERY USING PROPATEN GRAFT;  Surgeon: Nada Libman, MD;  [facility];  Service: Vascular;  Laterality: Left;  ? I & D EXTREMITY Left 09/07/2020  ? Procedure: IRRIGATION AND DEBRIDEMENT LEFT LEG WITH EXCISION OF LEFT LEG DISTAL BYPASS GRAFT;  Surgeon: Cephus Shelling, MD;  Location: [facility] OR;  Service: Vascular;  Laterality: Left;  ? LOWER EXTREMITY ANGIOGRAM Left 11/14/2019  ? Procedure: LEFT LOWER EXTREMITY ANGIOGRAM, BYPASS GRAFT ANGIOPLASTY;  Surgeon:  Nada Libman, MD;  Location: [facility] OR;  Service: Vascular;  Laterality: Left;  ? PERIPHERAL VASCULAR INTERVENTION  10/15/2019  ? Procedure: PERIPHERAL VASCULAR INTERVENTION;  Surgeon: Nada Libman, MD;  [facility];  Service: Cardiovascular;;  Rt Iliac  ? REMOVAL OF GRAFT Left 10/04/2020  ? Procedure: REMOVAL OF LEFT FEMORAL TO POPITEAL BYPASS GRAFT;  Surgeon: Nada Libman, MD;  [facility];  Service: Vascular;  Laterality: Left;  ? ?Patient Active Problem List   ? Diagnosis Date Noted  ? Acute blood loss anemia 12/25/2020  ? Hyponatremia 12/25/2020  ? 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 Protein-calorie malnutrition, severe 12/09/2020  ? Wound cellulitis 12/07/2020  ? 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 Ischemia of lower extremity 10/01/2020  ? 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 PAD (peripheral artery disease) (HCC) 11/13/2019  ? Callus of foot 07/01/2019  ? Intermittent claudication (HCC) 01/20/2017  ? Need for influenza vaccination 01/20/2017  ? Needs smoking cessation education 09/06/2016  ? Abnormal echocardiogram 09/05/2016  ? Abnormal EKG 07/25/2016  ? Preoperative cardiovascular examination 07/25/2016  ? Shortness of breath on exertion 07/25/2016  ? Dyslipidemia, goal LDL below 70 07/25/2016  ? High blood pressure 07/25/2016  ? Abnormal chest x-ray 07/04/2016  ? Peripheral vascular disease (HCC) 07/04/2016  ? Decreased pedal pulses 06/24/2016  ? 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 Heavy smoker 06/24/2016  ? Abnormal weight loss 06/24/2016  ? Foot pain, left 06/24/2016  ? Screening for prostate cancer 06/24/2016  ? ? ?ONSET DATE: 12/08/20 (Lt AKA revision) ? ?REFERRING DIAG: R74.081K (ICD-10-CM) - Unilateral AKA, left (HCC)  ? ?THERAPY DIAG:  ?Unsteadiness on feet ? ?Muscle weakness (generalized) ? ?SUBJECTIVE:  ? ?SUBJECTIVE STATEMENT: ? ?Pt accompanied by: self ? ?PERTINENT HISTORY:  This is a 61 y.o. male who is s/p:left above-the-knee amputation which was not healing and the only remaining option was to revise this to a fairly short left above-the-knee amputation.  This was performed on 12/08/2020. Pt also w/ PAD, HTN ? ?PRECAUTIONS: Fall and Other: *high blood pressure, LT AKA w/ prosthetic ? ?WEIGHT BEARING RESTRICTIONS No ? ?PAIN:  ?Are you having pain?  Tinnunitus Lt ear (not being addressed by O.T.) ? ?FALLS: Has patient  fallen in last 6 months? No ? ?LIVING ENVIRONMENT: ?Lives with: lives with their family (sister and nephew) ?Lives in: House/apartment one level, tub/shower combo w/ curtain ?Stairs: Yes: External: 3-4 steps; can reach both ?Has following equipment at home: Quad cane large base, Walker - 2 wheeled, Wheelchair (manual), shower chair, and bed side commode ? ?PLOF: Independent ? ?PATIENT GOALS LE dressing I'ly ? ?OBJECTIVE:  ? ?HAND DOMINANCE: Right ? ?ADLs: ? ?Transfers/ambulation related to ADLs: ?Eating: independent ?Grooming: independent ?UB Dressing: independent ?LB Dressing: Mod I w/o prosthesis, needs assist w/ prosthesis ?Toileting: independent (walks w/ walker to bathroom on one leg) ?Bathing: sponge bathing currently however could shower w/ proper DME (tub bench) ?Tub Shower transfers: not since Oct 2022 - would benefit from tub bench ? ? ?IADLs: ?Shopping: mod I using scooter in store ?Light housekeeping: folds laundry while seated (sister performs) ?Meal Prep: sister performs cooking, can get snack but needs help transporting if using walker - would benefit from walker basket and/or tray ?Community mobility: drives I'ly ?Medication management: sister puts in pill box for him ?Financial management: sister does mostly, pt does his own  car payment ?Handwriting:  no changes ? ?MOBILITY STATUS: Needs Assist: uses walker mostly in house, manual w/c occasionally for community ? ? ?UE ROM   BUE AROM WNL's ? ? ?UE MMT:   RUE shoulder grossly 4/5, LUE shoulder grossly 5/5  ? ? ?HAND FUNCTION: ?Grip strength: Right: 66.1 lbs; Left: 79.1 lbs ? ?COORDINATION: ?Intact fine motor coordination bilateral hands ? ?SENSATION: ?Reports intact ? ?EDEMA: Rt joint swelling MP joints of index and long fingers, OA and mild MP ulnar drift Rt hand ? ? ?COGNITION: ?Overall cognitive status: Difficulty to assess, appears intact for basic functional tasks, however had difficulty remembering dates of original and revised amputation (poor  historian)  ? ?VISION: ?Subjective report: I need to have my vision checked ?Baseline vision:  no glasses ?Visual history:  blurred vision ? ? ?OBSERVATIONS: Lt LE AKA, RUE mildly weaker than LUE, Rt

## 2021-06-09 ENCOUNTER — Other Ambulatory Visit: Payer: Self-pay

## 2021-06-09 ENCOUNTER — Encounter (HOSPITAL_COMMUNITY): Payer: Self-pay

## 2021-06-09 ENCOUNTER — Emergency Department (HOSPITAL_COMMUNITY)
Admission: EM | Admit: 2021-06-09 | Discharge: 2021-06-09 | Disposition: A | Discharge status: Home / Self Care | Payer: Medicaid Other | Attending: Emergency Medicine | Admitting: Emergency Medicine

## 2021-06-09 DIAGNOSIS — Z79899 Other long term (current) drug therapy: Secondary | ICD-10-CM | POA: Diagnosis not present

## 2021-06-09 DIAGNOSIS — H9312 Tinnitus, left ear: Secondary | ICD-10-CM | POA: Insufficient documentation

## 2021-06-09 DIAGNOSIS — Z7901 Long term (current) use of anticoagulants: Secondary | ICD-10-CM | POA: Diagnosis not present

## 2021-06-09 DIAGNOSIS — D649 Anemia, unspecified: Secondary | ICD-10-CM | POA: Diagnosis not present

## 2021-06-09 DIAGNOSIS — I10 Essential (primary) hypertension: Secondary | ICD-10-CM | POA: Insufficient documentation

## 2021-06-09 DIAGNOSIS — R7309 Other abnormal glucose: Secondary | ICD-10-CM | POA: Insufficient documentation

## 2021-06-09 DIAGNOSIS — Z87891 Personal history of nicotine dependence: Secondary | ICD-10-CM | POA: Insufficient documentation

## 2021-06-09 LAB — BASIC METABOLIC PANEL
Anion gap: 8 (ref 5–15)
BUN: 11 mg/dL (ref 8–23)
CO2: 22 mmol/L (ref 22–32)
Calcium: 9 mg/dL (ref 8.9–10.3)
Chloride: 107 mmol/L (ref 98–111)
Creatinine, Ser: 0.98 mg/dL (ref 0.61–1.24)
GFR, Estimated: >60 mL/min (ref >60)
Glucose, Bld: 104 mg/dL — ABNORMAL HIGH (ref 70–99)
Potassium: 3.5 mmol/L (ref 3.5–5.1)
Sodium: 137 mmol/L (ref 135–145)

## 2021-06-09 LAB — CBC
HCT: 36.8 % — ABNORMAL LOW (ref 39.0–52.0)
Hemoglobin: 11.9 g/dL — ABNORMAL LOW (ref 13.0–17.0)
MCH: 21.9 pg — ABNORMAL LOW (ref 26.0–34.0)
MCHC: 32.3 g/dL (ref 30.0–36.0)
MCV: 67.6 fL — ABNORMAL LOW (ref 80.0–100.0)
Platelets: 390 10*3/uL (ref 150–400)
RBC: 5.44 MIL/uL (ref 4.22–5.81)
RDW: 19.9 % — ABNORMAL HIGH (ref 11.5–15.5)
WBC: 7 10*3/uL (ref 4.0–10.5)
nRBC: 0 % (ref 0.0–0.2)

## 2021-06-09 NOTE — Discharge Instructions (Addendum)
It was a pleasure take care of you today! ? ?Your labs are overall unremarkable.  It is important that you call the ENT specialist today to set up a follow-up appointment regarding your visit today. Their information is attached [facility]). Ensure that when you are not taking her blood pressure that you keep a log at home to show your primary care provider.  Return to the emergency department for experiencing increasing/worsening headache, ear pain, hearing loss, vision changes, worsening symptoms.  ?

## 2021-06-09 NOTE — ED Provider Notes (Signed)
?[name] EMERGENCY DEPARTMENT ?Provider Note ? ? ?CSN: [id number] ?Arrival date & time: 06/09/21  0043 ? ?  ? ?History ? ?Chief Complaint  ?Patient presents with  ? Hypertension  ? ? ?Benjamin [name] is a 61 y.o. male with a past medical history of hypertension who presents to the emergency department complaint of elevated blood pressure reading onset PTA.  His blood pressure prior to arrival to the ED was 160/85.  He has associated intermittent tinnitus to left ear. The ringing to his left ear has been ongoing since he had an ear lavage in the clinic on 05/19/21.  Patient notes that he has not had the ENT specialist reach out to him regarding a follow-up appointment as of yet.  He has been taking his blood pressure medication as prescribed by his doctor however he thinks that he needs to have his dose increased.  He has been taking over-the-counter denies medications as well as his other prescription medications with relief of his symptoms.  Denies headache, vision changes, dizziness, numbness, ear pain, hearing loss, weakness, tingling, chest pain, shortness of breath, dysuria, hematuria.  ? ? ?The history is provided by the patient. No language interpreter was used.  ? ?  ? ?Home Medications ?Prior to Admission medications   ?Medication Sig Start Date End Date Taking? Authorizing Provider  ?acetaminophen (TYLENOL) 325 MG tablet Take 1-2 tablets (325-650 mg total) by mouth every 4 (four) hours as needed for mild pain. 12/25/20   Love, Evlyn Kanner, PA-C  ?atorvastatin (LIPITOR) 20 MG tablet Take 1 tablet (20 mg total) by mouth daily. 03/23/21   Raulkar, Drema Pry, MD  ?carvedilol (COREG) 6.25 MG tablet Take 1 tablet (6.25 mg total) by mouth 2 (two) times daily with a meal. 06/01/21 07/31/21  Trifan, Kermit Balo, MD  ?clopidogrel (PLAVIX) 75 MG tablet Take 1 tablet (75 mg total) by mouth daily. 05/11/21     ?docusate sodium (COLACE) 100 MG capsule Take 1 capsule (100 mg total) by mouth daily. 03/16/21    Raulkar, Drema Pry, MD  ?gabapentin (NEURONTIN) 600 MG tablet Take 1 tablet (600 mg total) by mouth 2 (two) times daily. 03/22/21   Raulkar, Drema Pry, MD  ?guaiFENesin-dextromethorphan (ROBITUSSIN DM) 100-10 MG/5ML syrup Take 15 mLs by mouth every 4 (four) hours as needed for cough. 12/15/20   Lorin Glass, MD  ?hydrOXYzine (ATARAX) 25 MG tablet Take 1 tablet (25 mg total) by mouth at bedtime for anxiety 06/01/21   Arnette Felts, FNP  ?lactose free nutrition (BOOST) LIQD Take 237 mLs by mouth 2 (two) times daily between meals.    [provider]  ?losartan (COZAAR) 25 MG tablet Take 1 tablet (25 mg total) by mouth daily. 03/23/21   Raulkar, Drema Pry, MD  ?losartan (COZAAR) 25 MG tablet Take 1 tablet (25 mg total) by mouth daily. 06/01/21 07/31/21  Terald Sleeper, MD  ?methocarbamol (ROBAXIN) 500 MG tablet Take 1 tablet (500 mg total) by mouth every 6 (six) hours as needed for muscle spasms. 12/25/20   Jacquelynn Cree, PA-C  ?Multiple Vitamin (MULTIVITAMIN WITH MINERALS) TABS tablet Take 1 tablet by mouth daily. 12/16/20   Lorin Glass, MD  ?senna (SENOKOT) 8.6 MG TABS tablet Take 1 tablet (8.6 mg total) by mouth 2 (two) times daily. 12/25/20   Love, Evlyn Kanner, PA-C  ?traZODone (DESYREL) 50 MG tablet Take 1 tablet (50 mg total) by mouth at bedtime as needed for sleep 03/23/21   Raulkar, Drema Pry, MD  ?   ? ?  Allergies    ?Aspirin and Garlic   ? ?Review of Systems   ?Review of Systems  ?HENT:  Positive for tinnitus (left). Negative for ear pain and hearing loss.   ?Eyes:  Negative for visual disturbance.  ?Respiratory:  Negative for shortness of breath.   ?Cardiovascular:  Negative for chest pain.  ?Genitourinary:  Negative for dysuria and hematuria.  ?Neurological:  Negative for dizziness, weakness, numbness and headaches.  ?All other systems reviewed and are negative. ? ?Physical Exam ?Updated Vital Signs ?BP (!) 163/87   Pulse 65   Temp 98.1 ?F (36.7 ?C)   Resp 12   SpO2 100%  ?Physical Exam ?Vitals and  nursing note reviewed.  ?Constitutional:   ?   General: He is not in acute distress. ?   Appearance: He is not diaphoretic.  ?HENT:  ?   Head: Normocephalic and atraumatic.  ?   Right Ear: Tympanic membrane, ear canal and external ear normal. No mastoid tenderness.  ?   Left Ear: Tympanic membrane, ear canal and external ear normal. No mastoid tenderness.  ?   Mouth/Throat:  ?   Pharynx: No oropharyngeal exudate.  ?Eyes:  ?   General: No scleral icterus. ?   Conjunctiva/sclera: Conjunctivae normal.  ?Cardiovascular:  ?   Rate and Rhythm: Normal rate and regular rhythm.  ?   Pulses: Normal pulses.  ?   Heart sounds: Normal heart sounds.  ?Pulmonary:  ?   Effort: Pulmonary effort is normal. No respiratory distress.  ?   Breath sounds: Normal breath sounds. No wheezing.  ?Abdominal:  ?   General: Bowel sounds are normal.  ?   Palpations: Abdomen is soft. There is no mass.  ?   Tenderness: There is no abdominal tenderness. There is no guarding or rebound.  ?Musculoskeletal:     ?   General: Normal range of motion.  ?   Cervical back: Normal range of motion and neck supple.  ?   Comments: Right AKA  ?Skin: ?   General: Skin is warm and dry.  ?Neurological:  ?   Mental Status: He is alert.  ?   Comments: Negative pronator drift. No focal neurological deficits.  ?Psychiatric:     ?   Behavior: Behavior normal.  ? ? ?ED Results / Procedures / Treatments   ?Labs ?(all labs ordered are listed, but only abnormal results are displayed) ?Labs Reviewed  ?CBC - Abnormal; Notable for the following components:  ?    Result Value  ? Hemoglobin 11.9 (*)   ? HCT 36.8 (*)   ? MCV 67.6 (*)   ? MCH 21.9 (*)   ? RDW 19.9 (*)   ? All other components within normal limits  ?BASIC METABOLIC PANEL - Abnormal; Notable for the following components:  ? Glucose, Bld 104 (*)   ? All other components within normal limits  ? ? ?EKG ?None ? ?Radiology ?No results found. ? ?Procedures ?Procedures  ? ? ?Medications Ordered in ED ?Medications - No data  to display ? ?ED Course/ Medical Decision Making/ A&P ?Clinical Course as of 06/09/21 0925  ?Wed Jun 09, 2021  ?7510 In-depth detailed conversation with patient regarding discharge treatment plan.  Stressed importance to patient the importance of maintaining follow-up with the ENT specialist.  Also stressed importance of maintaining follow-up with his primary care provider.  Discussed patient to keep a log of his blood pressures at home in order to show to his primary care provider.  Prior to  discharge blood pressure improved to 150/88 answered all available questions.  Patient appears safe to discharge at this time. [SB]  ?  ?Clinical Course User Index ?[SB] Abdinasir Spadafore A, PA-C  ? ?                        ?Medical Decision Making ? ?Pt presents with elevated blood pressure reading onset prior to arrival.  His blood pressure prior to arrival was 160/85.  Patient also has intermittent tinnitus to the left ear.  Vital signs stable, patient afebrile.  Slightly hypertensive initially at 151/92.  Patient has been compliant with his antihypertensives.  Patient has tried over-the-counter tinnitus medication with relief of his symptoms.  On exam patient without acute cardiovascular, respiratory, down exam findings.  No acute abnormalities noted to bilateral ears.  No mastoid tenderness to palpation noted bilaterally.  No erythema or bulging noted to bilateral TMs.  No focal neurological deficits.  Negative pronator drift.  Differential diagnosis includes otitis media, M?ni?re's disease, hypertension, CVA. ? ? ?Additional history obtained:  ?External records from outside source obtained and reviewed including: Patient was evaluated by his primary care provider on 05/19/2021 for tinnitus of the right ear.  At that time a referral to ENT was placed.  Also an order for an ultrasound carotid duplex bilateral was ordered.  At that time patient had impacted cerumen of the left ear and ear lavage was conducted in the office.   Patient was given information on ear hygiene.  Patient was evaluated in the emergency department on 06/01/2021 for elevated blood pressure and tinnitus to the left ear.  Patient was sent a prescription for 25 mg losar

## 2021-06-09 NOTE — ED Notes (Signed)
Reviewed discharge instructions with patient. Follow-up care and continuing medications reviewed. Patient verbalized understanding. Patient A&Ox4, VSS, and ambulatory with steady gait upon discharge.  ?

## 2021-06-09 NOTE — ED Triage Notes (Signed)
Pt arrives to ED POV c/o Hypertension and ringing of his RT ear x3 weeks. ?

## 2021-06-09 NOTE — ED Provider Triage Note (Signed)
?  Emergency Medicine Provider Triage Evaluation Note ? ?MRN:  [id number]  ?Arrival date & time: 06/09/21    ?Medically screening exam initiated at 1:20 AM.   ?CC:   ?Hypertension ?  ?HPI:  ?OTHMAR RINGER is a 61 y.o. year-old male presents to the ED with chief complaint of elevated blood pressure reading and tinnitus.  He states that he has been having this intermittently.  He has been taking blood pressure medicine prescribed by his doctor, but thinks he may need to have the dose increased.  He denies headache, numbness, weakness, tingling, chest pain, or urinary complaints.  Reports recent left leg amputation. ? ?History provided by History provided by patient. ?ROS:  ?-As included in HPI ?PE:  ? ?Vitals:  ? 06/09/21 0103  ?BP: (!) 151/92  ?Pulse: 82  ?Resp: 16  ?Temp: 98.3 ?F (36.8 ?C)  ?SpO2: 93%  ?  ?Non-toxic appearing ?No respiratory distress ?Left AKA ?MDM:  ? ? ?Patient was informed that the remainder of the evaluation will be completed by another provider, this initial triage assessment does not replace that evaluation, and the importance of remaining in the ED until their evaluation is complete. ? ?  ?Roxy Horseman, PA-C ?06/09/21 0122 ? ?

## 2021-06-10 ENCOUNTER — Other Ambulatory Visit: Payer: Self-pay

## 2021-06-10 NOTE — Patient Outreach (Signed)
?Medicaid Managed Care   ?Nurse Care Manager Note ? ?06/10/2021 ?Name:  Benjamin [name] MRN:  [id number] DOB:  [birth year] ? ?Benjamin [name] is an 61 y.o. year old male who is a primary patient of Benjamin Felts, FNP.  The [facility] Managed Care Coordination team was consulted for assistance with:    ?HTN ?PAD with Left AKA ? ?Benjamin [name] was given information about Medicaid Managed Care Coordination team services today. Benjamin [name] Patient agreed to services and verbal consent obtained. ? ?Engaged with patient by telephone for follow up visit in response to provider referral for case management and/or care coordination services.  ? ?Assessments/Interventions:  Review of past medical history, allergies, medications, health status, including review of consultants reports, laboratory and other test data, was performed as part of comprehensive evaluation and provision of chronic care management services. ? ?SDOH (Social Determinants of Health) assessments and interventions performed: ? ? ?Care Plan ? ?Allergies  ?Allergen Reactions  ? Aspirin Nausea And Vomiting  ? Garlic Other (See Comments)  ?  sneezing  ? ? ?Medications Reviewed Today   ? ? Reviewed by Benjamin Call, RN (Case Manager) on 06/10/21 at 1040  Med List Status: <None>  ? ?Medication Order Taking? Sig Documenting Provider Last Dose Status Informant  ?acetaminophen (TYLENOL) 325 MG tablet [id number] No Take 1-2 tablets (325-650 mg total) by mouth every 4 (four) hours as needed for mild pain. Benjamin Cree, PA-C Taking Active   ?atorvastatin (LIPITOR) 20 MG tablet [id number] No Take 1 tablet (20 mg total) by mouth daily. Benjamin Chin, [name] Taking Active   ?carvedilol (COREG) 6.25 MG tablet [id number]  Take 1 tablet (6.25 mg total) by mouth 2 (two) times daily with a meal. [name], Benjamin Balo, [name]  Active   ?clopidogrel (PLAVIX) 75 MG tablet [id number] No Take 1 tablet (75 mg total) by mouth daily.  Taking Active   ?docusate sodium (COLACE) 100 MG capsule  [id number] No Take 1 capsule (100 mg total) by mouth daily. Benjamin Chin, [name] Taking Active   ?gabapentin (NEURONTIN) 600 MG tablet [id number] No Take 1 tablet (600 mg total) by mouth 2 (two) times daily. Benjamin Chin, [name] Taking Active   ?guaiFENesin-dextromethorphan [facility] DM) 100-10 MG/5ML syrup [id number] No Take 15 mLs by mouth every 4 (four) hours as needed for cough. Benjamin Glass, [name] Taking Active   ?hydrOXYzine (ATARAX) 25 MG tablet [id number]  Take 1 tablet (25 mg total) by mouth at bedtime for anxiety Benjamin Felts, FNP  Active   ?lactose free nutrition (BOOST) LIQD [id number] No Take 237 mLs by mouth 2 (two) times daily between meals. Benjamin [name] Taking Active Self  ?losartan (COZAAR) 25 MG tablet [id number] No Take 1 tablet (25 mg total) by mouth daily. Benjamin Chin, [name] Taking Active   ?losartan (COZAAR) 25 MG tablet [id number]  Take 1 tablet (25 mg total) by mouth daily. Benjamin Sleeper, [name]  Active   ?methocarbamol (ROBAXIN) 500 MG tablet [id number] No Take 1 tablet (500 mg total) by mouth every 6 (six) hours as needed for muscle spasms. Benjamin Cree, PA-C Taking Active   ?Multiple Vitamin (MULTIVITAMIN WITH MINERALS) TABS tablet [id number] No Take 1 tablet by mouth daily. Benjamin Glass, [name] Taking Active   ?senna (SENOKOT) 8.6 MG TABS tablet [id number] No Take 1 tablet (8.6 mg total) by mouth 2 (two) times daily. Benjamin Cree, PA-C Taking Active   ?traZODone (DESYREL) 50 MG tablet [id number] No Take 1 tablet (  50 mg total) by mouth at bedtime as needed for sleep Raulkar, Drema Pry, [name] Taking Active   ? ?  ?  ? ?  ? ? ?Patient Active Problem List  ? Diagnosis Date Noted  ? Acute blood loss anemia 12/25/2020  ? Hyponatremia 12/25/2020  ? 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 Protein-calorie malnutrition, severe 12/09/2020  ? Wound cellulitis 12/07/2020  ? 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 Ischemia of lower  extremity 10/01/2020  ? 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 PAD (peripheral artery disease) (HCC) 11/13/2019  ? Callus of foot 07/01/2019  ? Intermittent claudication (HCC) 01/20/2017  ? Need for influenza vaccination 01/20/2017  ? Needs smoking cessation education 09/06/2016  ? Abnormal echocardiogram 09/05/2016  ? Abnormal EKG 07/25/2016  ? Preoperative cardiovascular examination 07/25/2016  ? Shortness of breath on exertion 07/25/2016  ? Dyslipidemia, goal LDL below 70 07/25/2016  ? High blood pressure 07/25/2016  ? Abnormal chest x-ray 07/04/2016  ? Peripheral vascular disease (HCC) 07/04/2016  ? Decreased pedal pulses 06/24/2016  ? 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 Heavy smoker 06/24/2016  ? Abnormal weight loss 06/24/2016  ? Foot pain, left 06/24/2016  ? Screening for prostate cancer 06/24/2016  ? ? ?Conditions to be addressed/monitored per PCP order:  HTN and PAD with Left AKA ? ?Care Plan : RN Care Manger Plan of Care  ?Updates made by Benjamin Call, RN since 06/10/2021 12:00 AM  ?  ? ?Problem: Chronic Disease Management and Care Coordination Needs for HTN, PAD with status post left AKA   ?Priority: High  ?  ? ?Long-Range Goal: Development of Plan of Care for Chronic Disease Management and Care Coordination Needs (HTN, PAD with status post left AKA)   ?Start Date: 02/18/2021  ?Expected End Date: 08/20/2021  ?Priority: High  ?Note:   ?Current Barriers:  ?Knowledge Deficits related to plan of care for management of HTN and PAD with status post left AKA on 10/04/2020  ?Care Coordination needs related to Financial constraints related to obtaining medications and rent, Medication procurement, and ADL IADL limitations  ?Chronic Disease Management support and education needs related to HTN and PAD with status post left AKA ?Corporate treasurer.  ?Difficulty obtaining medications ? ?RNCM Clinical Goal(s):  ?RNCM will help patient verbalize  understanding of plan for management of HTN and PAD with status post left AKA as evidenced by improved management of these chronic diseases/ ?verbalize basic understanding of HTN and PAD with status post left AKA disease process and self health management plan as evidenced by improved management of these chronic diseases. ?take all medications exactly as prescribed and will [name] provider for medication related questions as evidenced by being compliant with all medications    ?attend all scheduled medical appointments: 07/28/2021 PCP Visit; 08/16/2021 ENT visit; weekly Physical Therapy as evidenced by attending all scheduled appointments        ?demonstrate improved adherence to prescribed treatment plan for HTN and PAD with status post left AKA as evidenced by blood pressure readings within normal limits and proper healing of left AKA. ?demonstrate improved health management independence as evidenced by blood pressure readings within normal limits and proper healing of left AKA.        ?continue to work with Medical illustrator and/or Social Worker to address care management and care coordination needs related to HTN and PAD with status post  left AKA as evidenced by adherence to CM Team Scheduled appointments     ?work with pharmacist to address complex medication regimen related to HTN and PAD with status post left AKA as evidenced by review of EMR and patient or pharmacist report    ?work with Child psychotherapistsocial worker to address Lacks knowledge of community resource: for Dental Care & Vision Care covered under [employer] insurance related to the management of HTN and PAD with status post left AKA as evidenced by review of EMR and patient or Child psychotherapistsocial worker report     through collaboration with Medical illustratorN Care manager, provider, and care team.  ? ?Interventions: ?Inter-disciplinary care team collaboration (see longitudinal plan of care) ?Evaluation of current treatment plan related to  self management and patient's adherence to plan as  established by provider ?BSW contacted patient regarding Dental resources. Patient stated he would like dental resources in the area to be mailed to him. Patient stated that everything else was good and no other r

## 2021-06-10 NOTE — Patient Instructions (Signed)
Visit Information ? ?Benjamin [name] was given information about Medicaid Managed Care team care coordination services as a part of their Healthy [facility] Medicaid benefit. Benjamin [name] verbally consented to engagement with the [facility] Managed Care team.  ? ?If you are experiencing a medical emergency, please call 911 or report to your local emergency department or urgent care.  ? ?If you have a non-emergency medical problem during routine business hours, please contact your provider's office and ask to speak with a nurse.  ? ?For questions related to your Healthy [facility] health plan, please call: [phone] or visit the homepage here: [email or web address] ? ?If you would like to schedule transportation through your Healthy [facility] plan, please call the following number at least 2 days in advance of your appointment: [phone] ? For information about your ride after you set it up, call Ride Assist at [phone]. Use this number to activate a Will Call pickup, or if your transportation is late for a scheduled pickup. Use this number, too, if you need to make a change or cancel a previously scheduled reservation. ? If you need transportation services right away, call [phone]. The after-hours call center is staffed 24 hours to handle ride assistance and urgent reservation requests (including discharges) 365 days a year. Urgent trips include sick visits, hospital discharge requests and life-sustaining treatment. ? ?Call the [place] at [phone], at any time, 24 hours a day, 7 days a week. If you are in danger or need immediate medical attention call 911. ? ?If you would like help to quit smoking, call 1-800-QUIT-NOW (240)251-2860) OR Espa?ol: 1-855-D?jelo-Ya 4405020948) o para m?s informaci?n haga clic aqu? or Text READY to 200-400 to register via text ? ?Benjamin [name] - following are the goals we discussed in your visit today:  Please see  Patient Goals in the [place] of Care below. ? ?Please see education materials related to today's visit provided as print materials.  ? ?The patient verbalized understanding of instructions, educational materials, and care plan provided today and agreed to receive a mailed copy of patient instructions, educational materials, and care plan.  ? ?The Managed Medicaid care management team will reach out to the patient again over the next 30 days.  ? ?Benjamin Marvel RN, BSN ?Community Care Coordinator ?[facility] ?Mobile: [phone]  ? ?Following is a copy of your plan of care:  ?Care Plan : [place] of Care  ?Updates made by Inge Rise, RN since 06/10/2021 12:00 AM  ?  ? ?Problem: Chronic Disease Management and Care Coordination Needs for HTN, PAD with status post left AKA   ?Priority: High  ?  ? ?Long-Range Goal: Development of Plan of Care for Chronic Disease Management and Care Coordination Needs (HTN, PAD with status post left AKA)   ?Start Date: 02/18/2021  ?Expected End Date: 08/20/2021  ?Priority: High  ?Note:   ?Current Barriers:  ?Knowledge Deficits related to plan of care for management of HTN and PAD with status post left AKA on 10/04/2020  ?Care Coordination needs related to Financial constraints related to obtaining medications and rent, Medication procurement, and ADL IADL limitations  ?Chronic Disease Management support and education needs related to HTN and PAD with status post left AKA ?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 ?RNCM Clinical Goal(s):  ?RNCM will help patient verbalize understanding of plan for management of HTN and PAD with status post left AKA as evidenced by improved management of  these chronic diseases/ ?verbalize basic understanding of HTN and PAD with status post left AKA disease process and self health management plan as evidenced by improved management of these chronic diseases. ?take all  medications exactly as prescribed and will call provider for medication related questions as evidenced by being compliant with all medications    ?attend all scheduled medical appointments: 07/28/2021 PCP Visit; 08/16/2021 ENT visit; weekly Physical Therapy as evidenced by attending all scheduled appointments        ?demonstrate improved adherence to prescribed treatment plan for HTN and PAD with status post left AKA as evidenced by blood pressure readings within normal limits and proper healing of left AKA. ?demonstrate improved health management independence as evidenced by blood pressure readings within normal limits and proper healing of left AKA.        ?continue to work with Consulting civil engineer and/or Social Worker to address care management and care coordination needs related to HTN and PAD with status post left AKA as evidenced by adherence to CM Team Scheduled appointments     ?work with pharmacist to address complex medication regimen related to HTN and PAD with status post left AKA as evidenced by review of EMR and patient or pharmacist report    ?work with Education officer, museum to address Lacks knowledge of community resource: for [place] covered under [employer] related to the management of HTN and PAD with status post left AKA as evidenced by review of EMR and patient or Education officer, museum report     through collaboration with Consulting civil engineer, provider, and care team.  ? ?Interventions: ?Inter-disciplinary care team collaboration (see longitudinal plan of care) ?Evaluation of current treatment plan related to  self management and patient's adherence to plan as established by provider ?BSW contacted patient regarding Dental resources. Patient stated he would like dental resources in the area to be mailed to him. Patient stated that everything else was good and no other resources are needed at this time.  ?03/29/21: BSW completed follow up with patient. He stated he did not receive the letter  with the dental resources. Patient stated he would also like eye resources. BSW will send a letter with dental and eye resources. ? ? ?PAD with status post left AKA on 10/04/2020 and revision on 12/08/2020  (Status: Goal on Track (progressing): YES.) Long Term Goal  ?Evaluation of current treatment plan related to  PAD with status post left AKA , Financial constraints related to obtaining medications (zinc tablets for AKA healing) and rent, Medication procurement, Inability to perform IADL's independently, and Lacks knowledge of community resource: for dental care & vision care services covered ny [employer],  self-management and patient's adherence to plan as established by provider. ?Discussed plans with patient for ongoing care management follow up and provided patient with direct contact information for care management team ?Reviewed medications with patient and discussed importance of medication compliance and benefits of zinc & Vitamin C for healing of left AKA; ?Reviewed scheduled/upcoming provider appointments including : 07/28/2021 PCP Visit; 08/16/2021 ENT visit; Weekly Physical Therapy; ?Social Work referral for information regarding dental & vision services covered under [employer] Medicaid ; ?Pharmacy referral for complex medication regimen;  ?Patient received his Left AKA prosthesis.  Patient started Physical Therapy last week and will attend weekly sessions going forward for gait training.  Patient denies any falls and reports good appetite.  This RN Care Manager continued to encourage patient to pace himself in terms  of his activities.  Patient reports he is also working out at a gym to build his upper extremity strength. ? ?Hypertension: (Status: Goal on track: NO.) Long Term Goal  ?Last practice recorded BP readings:  ?BP Readings from Last 3 Encounters:  ?06/09/21 (!) 163/87  ?06/01/21 (!) 168/97  ?05/19/21 130/80  ? ?Most recent eGFR/CrCl:  ?Lab Results  ?Component Value Date  ? EGFR  101 07/22/2020  ?  No components found for: CRCL ? ?Evaluation of current treatment plan related to hypertension self management and patient's adherence to plan as established by provider;   ?Reviewed prescribed diet

## 2021-06-12 ENCOUNTER — Encounter (HOSPITAL_COMMUNITY): Payer: Self-pay | Admitting: *Deleted

## 2021-06-12 ENCOUNTER — Emergency Department (HOSPITAL_COMMUNITY): Payer: Medicaid Other

## 2021-06-12 ENCOUNTER — Other Ambulatory Visit: Payer: Self-pay

## 2021-06-12 ENCOUNTER — Emergency Department (HOSPITAL_COMMUNITY)
Admission: EM | Admit: 2021-06-12 | Discharge: 2021-06-12 | Disposition: A | Discharge status: Home / Self Care | Payer: Medicaid Other | Attending: Emergency Medicine | Admitting: Emergency Medicine

## 2021-06-12 DIAGNOSIS — Z79899 Other long term (current) drug therapy: Secondary | ICD-10-CM | POA: Diagnosis not present

## 2021-06-12 DIAGNOSIS — R079 Chest pain, unspecified: Secondary | ICD-10-CM

## 2021-06-12 DIAGNOSIS — R0789 Other chest pain: Secondary | ICD-10-CM | POA: Insufficient documentation

## 2021-06-12 DIAGNOSIS — Z87891 Personal history of nicotine dependence: Secondary | ICD-10-CM | POA: Diagnosis not present

## 2021-06-12 DIAGNOSIS — R062 Wheezing: Secondary | ICD-10-CM | POA: Diagnosis not present

## 2021-06-12 DIAGNOSIS — I10 Essential (primary) hypertension: Secondary | ICD-10-CM | POA: Insufficient documentation

## 2021-06-12 DIAGNOSIS — Z7902 Long term (current) use of antithrombotics/antiplatelets: Secondary | ICD-10-CM | POA: Diagnosis not present

## 2021-06-12 LAB — BRAIN NATRIURETIC PEPTIDE: B Natriuretic Peptide: 20.4 pg/mL (ref 0.0–100.0)

## 2021-06-12 LAB — BASIC METABOLIC PANEL
Anion gap: 8 (ref 5–15)
BUN: 14 mg/dL (ref 8–23)
CO2: 22 mmol/L (ref 22–32)
Calcium: 9.2 mg/dL (ref 8.9–10.3)
Chloride: 110 mmol/L (ref 98–111)
Creatinine, Ser: 0.86 mg/dL (ref 0.61–1.24)
GFR, Estimated: >60 mL/min (ref >60)
Glucose, Bld: 95 mg/dL (ref 70–99)
Potassium: 3.8 mmol/L (ref 3.5–5.1)
Sodium: 140 mmol/L (ref 135–145)

## 2021-06-12 LAB — CBC WITH DIFFERENTIAL/PLATELET
Abs Immature Granulocytes: 0.01 10*3/uL (ref 0.00–0.07)
Basophils Absolute: 0 10*3/uL (ref 0.0–0.1)
Basophils Relative: 1 %
Eosinophils Absolute: 0.1 10*3/uL (ref 0.0–0.5)
Eosinophils Relative: 2 %
HCT: 36 % — ABNORMAL LOW (ref 39.0–52.0)
Hemoglobin: 11.4 g/dL — ABNORMAL LOW (ref 13.0–17.0)
Immature Granulocytes: 0 %
Lymphocytes Relative: 24 %
Lymphs Abs: 1.4 10*3/uL (ref 0.7–4.0)
MCH: 21.8 pg — ABNORMAL LOW (ref 26.0–34.0)
MCHC: 31.7 g/dL (ref 30.0–36.0)
MCV: 68.8 fL — ABNORMAL LOW (ref 80.0–100.0)
Monocytes Absolute: 0.4 10*3/uL (ref 0.1–1.0)
Monocytes Relative: 7 %
Neutro Abs: 3.7 10*3/uL (ref 1.7–7.7)
Neutrophils Relative %: 66 %
Platelets: 372 10*3/uL (ref 150–400)
RBC: 5.23 MIL/uL (ref 4.22–5.81)
RDW: 19.9 % — ABNORMAL HIGH (ref 11.5–15.5)
WBC: 5.6 10*3/uL (ref 4.0–10.5)
nRBC: 0 % (ref 0.0–0.2)

## 2021-06-12 LAB — TROPONIN I (HIGH SENSITIVITY)
Troponin I (High Sensitivity): 6 ng/L (ref <18)
Troponin I (High Sensitivity): 5 ng/L (ref <18)

## 2021-06-12 MED ORDER — IPRATROPIUM-ALBUTEROL 0.5-2.5 (3) MG/3ML IN SOLN
3.0000 mL | Freq: Once | RESPIRATORY_TRACT | Status: AC
  Administered 2021-06-12: 3 mL via RESPIRATORY_TRACT
  Filled 2021-06-12: qty 3

## 2021-06-12 MED ORDER — CARVEDILOL 3.125 MG PO TABS
6.2500 mg | ORAL_TABLET | Freq: Once | ORAL | Status: AC
  Administered 2021-06-12: 6.25 mg via ORAL
  Filled 2021-06-12: qty 2

## 2021-06-12 MED ORDER — ALBUTEROL SULFATE (2.5 MG/3ML) 0.083% IN NEBU: 2.5000 mg | INHALATION_SOLUTION | Freq: Once | RESPIRATORY_TRACT | Status: DC

## 2021-06-12 MED ORDER — ALBUTEROL SULFATE HFA 108 (90 BASE) MCG/ACT IN AERS
1.0000 | INHALATION_SPRAY | Freq: Four times a day (QID) | RESPIRATORY_TRACT | 0 refills | Status: DC | PRN
  Filled 2021-06-12: qty 18, 25d supply, fill #0

## 2021-06-12 NOTE — ED Provider Notes (Signed)
?[name] ?Provider Note ? ? ?CSN: [id number] ?Arrival date & time: 06/12/21  0543 ? ?  ? ?History ? ?Chief Complaint  ?Patient presents with  ? Chest Pain  ? ? ?Benjamin [name] is a 61 y.o. male. ? ? Patient as above with significant medical history as below, including hypertension, hyperlipidemia, tobacco use, left-sided AKA who presents to the ED with complaint of chest tightness, high blood pressure. ? ?Patient reports he has been having high blood pressure, chest tightness since early this morning.  Difficulty sleeping last night secondary to the elevated blood pressure, chest tightness.  He is also having some ringing in his ears.  No nausea or vomiting.  No significant palpitations.  No chest pain only chest tightness.  No heaviness or pressure.  He has had this problem in the past when his blood pressure was elevated.  Also reports that his elevated blood pressures numbness and caused him to have ringing in his ears.  Usually improves after he takes his blood pressure medicine.  He has been compliant with his medications.  No recent stimulant use. ? ?Aspirin allergy ? ?Past Medical History: ?No date: Back pain ?No date: Heavy smoker ?No date: Hyperlipidemia LDL goal <70 ?No date: Hypertension ?06/2013: Peripheral arterial occlusive disease (HCC) ?    Comment:  Bilateral femoral artery disease ? ?Past Surgical History: ?07/12/2016: ABDOMINAL AORTOGRAM W/LOWER EXTREMITY; N/A ?    Comment:  Procedure: Abdominal Aortogram w/Lower Extremity;   ?             Surgeon: Nada LibmanBrabham, Vance W, MD;  [facility]  ?             LAB;  Service: Cardiovascular;  Laterality: N/A;   ?             Bilateral extermity: Patent Renal As. No sig Dz in  ?             infrarenal Abd Aorta. Normal Bilat Iliac arteries. R SFA  ?             is 100% @ origin - recon in AK-Pop A. R PT A patent. L  ?             CFA occluded. L PFA recon @ origin, L SFA occluded w/  ?             recon in AK  Pop A. ?10/15/2019: ABDOMINAL AORTOGRAM W/LOWER EXTREMITY; N/A ?    Comment:  Procedure: ABDOMINAL AORTOGRAM W/LOWER EXTREMITY;   ?             Surgeon: Nada LibmanBrabham, Vance W, MD;  Location: [facility] INVASIVE CV  ?             LAB;  Service: Cardiovascular;  Laterality: N/A; ?10/04/2020: AMPUTATION; Left ?    Comment:  Procedure: LEFT ABOVE KNEE AMPUTATION;  Surgeon:  ?             Nada LibmanBrabham, Vance W, MD;  Location: [facility] OR;  Service:  ?             Vascular;  Laterality: Left; ?12/08/2020: AMPUTATION; Left ?    Comment:  Procedure: RIGHT ABOVE KNEE AMPUTATION REVISION;   ?             Surgeon: Chuck Hintickson, Christopher S, MD;  [facility];   ?             Service: Vascular;  Laterality: Left; ?  05/08/2020: AORTA - BILATERAL FEMORAL ARTERY BYPASS GRAFT; N/A ?    Comment:  Procedure: AORTA BIFEMORAL BYPASS GRAFT;  Surgeon:  ?             Nada Libman, MD;  Location: [facility] OR;  Service:  ?             Vascular;  Laterality: N/A; ?06/2016: COLONOSCOPY ?    Comment:  never ?11/14/2019: ENDARTERECTOMY FEMORAL; Left ?    Comment:  Procedure: Left groin exploration, Redo left femoral  ?             artery exposure;  Surgeon: Nada Libman, MD;   ?             Location: [facility] OR;  Service: Vascular;  Laterality: Left; ?11/13/2019: FEMORAL-FEMORAL BYPASS GRAFT; N/A ?    Comment:  Procedure: BYPASS GRAFT FEMORAL-FEMORAL ARTERY RIGHT TO  ?             LEFT USING HEMASHIELD GOLD GRAFT 1mm x 30cm;  Surgeon:  ?             Nada Libman, MD;  Location: [facility] OR;  Service:  ?             Vascular;  Laterality: N/A; ?11/13/2019: FEMORAL-POPLITEAL BYPASS GRAFT; Left ?    Comment:  Procedure: LEFT FEMORAL BELOW KNEE-POPLITEAL ARTERY  ?             USING NON-REVERSED GREATER SAPHENOUS VEIN;  Surgeon:  ?             Nada Libman, MD;  Location: [facility] OR;  Service:  ?             Vascular;  Laterality: Left; ?05/08/2020: FEMORAL-POPLITEAL BYPASS GRAFT; Left ?    Comment:  Procedure: LEFT REDO BYPASS GRAFT COMMON FEMORAL- BELOW  ?             KNEE  POPLITEAL ARTERY USING PROPATEN GRAFT;  Surgeon:  ?             Nada Libman, MD;  Location: [facility] OR;  Service:  ?             Vascular;  Laterality: Left; ?09/07/2020: I & D EXTREMITY; Left ?    Comment:  Procedure: IRRIGATION AND DEBRIDEMENT LEFT LEG WITH  ?             EXCISION OF LEFT LEG DISTAL BYPASS GRAFT;  Surgeon:  ?             Cephus Shelling, MD;  Location: [facility] OR;  Service:  ?             Vascular;  Laterality: Left; ?11/14/2019: LOWER EXTREMITY ANGIOGRAM; Left ?    Comment:  Procedure: LEFT LOWER EXTREMITY ANGIOGRAM, BYPASS GRAFT  ?             ANGIOPLASTY;  Surgeon: Nada Libman, MD;  Location:  ?             [facility] OR;  Service: Vascular;  Laterality: Left; ?10/15/2019: PERIPHERAL VASCULAR INTERVENTION ?    Comment:  Procedure: PERIPHERAL VASCULAR INTERVENTION;  Surgeon:  ?             Nada Libman, MD;  Location: [facility] INVASIVE CV LAB;   ?             Service: Cardiovascular;;  Rt Iliac ?10/04/2020: REMOVAL OF GRAFT; Left ?  Comment:  Procedure: REMOVAL OF LEFT FEMORAL TO POPITEAL BYPASS  ?             GRAFT;  Surgeon: Nada Libman, MD;  Location: [facility] OR;  ?             Service: Vascular;  Laterality: Left;  ? ? ?The history is provided by the patient. No language interpreter was used.  ?Chest Pain ?Associated symptoms: no abdominal pain, no cough, no dysphagia, no fever, no headache, no nausea, no numbness, no palpitations, no shortness of breath, no vomiting and no weakness   ? ?  ? ?Home Medications ?Prior to Admission medications   ?Medication Sig Start Date End Date Taking? Authorizing Provider  ?acetaminophen (TYLENOL) 325 MG tablet Take 1-2 tablets (325-650 mg total) by mouth every 4 (four) hours as needed for mild pain. 12/25/20   Love, Evlyn Kanner, PA-C  ?atorvastatin (LIPITOR) 20 MG tablet Take 1 tablet (20 mg total) by mouth daily. 03/23/21   Raulkar, Drema Pry, MD  ?carvedilol (COREG) 6.25 MG tablet Take 1 tablet (6.25 mg total) by mouth 2 (two) times daily with a meal. 06/01/21  07/31/21  Trifan, Kermit Balo, MD  ?clopidogrel (PLAVIX) 75 MG tablet Take 1 tablet (75 mg total) by mouth daily. 05/11/21     ?docusate sodium (COLACE) 100 MG capsule Take 1 capsule (100 mg total) by mouth daily. 03/16/21   Raulkar, Drema Pry, MD  ?gabapentin (NEURONTIN) 600 MG tablet Take 1 tablet (600 mg total) by mouth 2 (two) times daily. 03/22/21   Raulkar, Drema Pry, MD  ?guaiFENesin-dextromethorphan (ROBITUSSIN DM) 100-10 MG/5ML syrup Take 15 mLs by mouth every 4 (four) hours as needed for cough. 12/15/20   Lorin Glass, MD  ?hydrOXYzine (ATARAX) 25 MG tablet Take 1 tablet (25 mg total) by mouth at bedtime for anxiety 06/01/21   Arnette Felts, FNP  ?lactose free nutrition (BOOST) LIQD Take 237 mLs by mouth 2 (two) times daily between meals.    [provider]  ?losartan (COZAAR) 25 MG tablet Take 1 tablet (25 mg total) by mouth daily. 03/23/21   Raulkar, Drema Pry, MD  ?losartan (COZAAR) 25 MG tablet Take 1 tablet (25 mg total) by mouth daily. 06/01/21 07/31/21  Terald Sleeper, MD  ?methocarbamol (ROBAXIN) 500 MG tablet Take 1 tablet (500 mg total) by mouth every 6 (six) hours as needed for muscle spasms. 12/25/20   Jacquelynn Cree, PA-C  ?Multiple Vitamin (MULTIVITAMIN WITH MINERALS) TABS tablet Take 1 tablet by mouth daily. 12/16/20   Lorin Glass, MD  ?senna (SENOKOT) 8.6 MG TABS tablet Take 1 tablet (8.6 mg total) by mouth 2 (two) times daily. 12/25/20   Love, Evlyn Kanner, PA-C  ?traZODone (DESYREL) 50 MG tablet Take 1 tablet (50 mg total) by mouth at bedtime as needed for sleep 03/23/21   Raulkar, Drema Pry, MD  ?   ? ?Allergies    ?Aspirin and Garlic   ? ?Review of Systems   ?Review of Systems  ?Constitutional:  Negative for chills and fever.  ?HENT:  Positive for tinnitus. Negative for facial swelling and trouble swallowing.   ?Eyes:  Negative for photophobia and visual disturbance.  ?Respiratory:  Positive for chest tightness. Negative for cough and shortness of breath.   ?Cardiovascular:  Negative for  palpitations.  ?Gastrointestinal:  Negative for abdominal pain, nausea and vomiting.  ?Endocrine: Negative for polydipsia and polyuria.  ?Genitourinary:  Negative for difficulty urinating and hematuria.  ?M

## 2021-06-12 NOTE — ED Triage Notes (Signed)
The pt is c/o some chest tightness and his heart pounding whenever he tried to sleep his bp has been high.  He has a ak amp of the lt leg  last year ?

## 2021-06-12 NOTE — ED Provider Triage Note (Signed)
Emergency Medicine Provider Triage Evaluation Note ? ?Benjamin [name] , a 61 y.o. male  was evaluated in triage.  Pt complains of chest discomfort.  States BP has been running high and woke up this morning and felt some discomfort in his chest and like his heart was racing.  Denies SOB, diaphoresis.  Denies prior cardiac issues. ? ?Review of Systems  ?Positive: Chest discomfort ?Negative: fever ? ?Physical Exam  ?BP (!) 156/100   Pulse 75   Temp 98 ?F (36.7 ?C) (Oral)   Resp 16   SpO2 97%  ?Gen:   Awake, no distress   ?Resp:  Normal effort  ?MSK:   Moves extremities without difficulty  ?Other:  Left leg amputation ? ?Medical Decision Making  ?Medically screening exam initiated at 5:58 AM.  Appropriate orders placed.  Benjamin [name] was informed that the remainder of the evaluation will be completed by another provider, this initial triage assessment does not replace that evaluation, and the importance of remaining in the ED until their evaluation is complete. ? ?Chest discomfort.  EKG, labs, CXR. ?  ?Garlon Hatchet, PA-C ?06/12/21 0600 ? ?

## 2021-06-12 NOTE — Discharge Instructions (Signed)
It was a pleasure caring for you today in the emergency department.  Please return to the emergency department for any worsening or worrisome symptoms.   Return to the Emergency Department if you have unusual chest pain, pressure, or discomfort, shortness of breath, nausea, vomiting, burping, heartburn, tingling upper body parts, sweating, cold, clammy skin, or racing heartbeat. Call 911 if you think you are having a heart attack. Take all cardiac medications as prescribed - notify your doctor if you have any side effects. Follow cardiac diet - avoid fatty & fried foods, don't eat too much red meat, eat lots of fruits & vegetables, and dairy products should be low fat. Please lose weight if you are overweight. Become more active with walking, gardening, or any other activity that gets you to moving.   Please return to the emergency department immediately for any new or concerning symptoms, or if you get worse.  

## 2021-06-14 ENCOUNTER — Other Ambulatory Visit: Payer: Self-pay

## 2021-06-14 ENCOUNTER — Telehealth: Payer: Self-pay

## 2021-06-14 NOTE — Telephone Encounter (Signed)
Spoke with pts wife, at this time Benjamin [name] was unavailable. Pts wife reports she was unsure if he would like an appointment with PCP since he does see cardiologist. Pts wife stated she will let him know, she was given ext [phone] if he would like to give the office a call for appointment.   ?

## 2021-06-15 ENCOUNTER — Encounter (HOSPITAL_COMMUNITY): Payer: Self-pay

## 2021-06-15 ENCOUNTER — Ambulatory Visit (HOSPITAL_COMMUNITY)
Admission: EM | Admit: 2021-06-15 | Discharge: 2021-06-15 | Disposition: A | Discharge status: Home / Self Care | Payer: Medicaid Other

## 2021-06-15 ENCOUNTER — Other Ambulatory Visit: Payer: Self-pay

## 2021-06-15 ENCOUNTER — Emergency Department (HOSPITAL_COMMUNITY): Payer: Medicaid Other

## 2021-06-15 ENCOUNTER — Encounter (HOSPITAL_COMMUNITY): Payer: Self-pay | Admitting: Emergency Medicine

## 2021-06-15 ENCOUNTER — Emergency Department (HOSPITAL_COMMUNITY)
Admission: EM | Admit: 2021-06-15 | Discharge: 2021-06-15 | Disposition: A | Discharge status: Home / Self Care | Payer: Medicaid Other | Attending: Emergency Medicine | Admitting: Emergency Medicine

## 2021-06-15 DIAGNOSIS — H9312 Tinnitus, left ear: Secondary | ICD-10-CM | POA: Insufficient documentation

## 2021-06-15 DIAGNOSIS — Z7902 Long term (current) use of antithrombotics/antiplatelets: Secondary | ICD-10-CM | POA: Diagnosis not present

## 2021-06-15 DIAGNOSIS — Z79899 Other long term (current) drug therapy: Secondary | ICD-10-CM | POA: Insufficient documentation

## 2021-06-15 DIAGNOSIS — I10 Essential (primary) hypertension: Secondary | ICD-10-CM | POA: Diagnosis not present

## 2021-06-15 LAB — BASIC METABOLIC PANEL
Anion gap: 7 (ref 5–15)
BUN: 11 mg/dL (ref 8–23)
CO2: 24 mmol/L (ref 22–32)
Calcium: 8.9 mg/dL (ref 8.9–10.3)
Chloride: 105 mmol/L (ref 98–111)
Creatinine, Ser: 0.9 mg/dL (ref 0.61–1.24)
GFR, Estimated: >60 mL/min (ref >60)
Glucose, Bld: 124 mg/dL — ABNORMAL HIGH (ref 70–99)
Potassium: 3.8 mmol/L (ref 3.5–5.1)
Sodium: 136 mmol/L (ref 135–145)

## 2021-06-15 LAB — CBC
HCT: 36.5 % — ABNORMAL LOW (ref 39.0–52.0)
Hemoglobin: 11.6 g/dL — ABNORMAL LOW (ref 13.0–17.0)
MCH: 22 pg — ABNORMAL LOW (ref 26.0–34.0)
MCHC: 31.8 g/dL (ref 30.0–36.0)
MCV: 69.3 fL — ABNORMAL LOW (ref 80.0–100.0)
Platelets: 355 10*3/uL (ref 150–400)
RBC: 5.27 MIL/uL (ref 4.22–5.81)
RDW: 19.6 % — ABNORMAL HIGH (ref 11.5–15.5)
WBC: 5.8 10*3/uL (ref 4.0–10.5)
nRBC: 0 % (ref 0.0–0.2)

## 2021-06-15 LAB — TROPONIN I (HIGH SENSITIVITY): Troponin I (High Sensitivity): 8 ng/L (ref <18)

## 2021-06-15 MED ORDER — NORTRIPTYLINE HCL 50 MG PO CAPS
100.0000 mg | ORAL_CAPSULE | Freq: Every day | ORAL | 0 refills | Status: DC
  Filled 2021-06-15: qty 30, 15d supply, fill #0

## 2021-06-15 NOTE — ED Triage Notes (Signed)
Pt here for high blood pressure of 120/100. Endorses chest tightness. Denies SOB. Tightness does not radiate. ?

## 2021-06-15 NOTE — Discharge Instructions (Signed)
Please go pick up your prescription that you were given today the emergency room for your tinnitus.  Your ear does not need to be cleaned out due to lack of earwax in your left ear.  Your ears look wonderful on exam. ? ?Please go to ear nose and throat in June.  You may call them tomorrow to see if they can get you in sooner. ? ?Please follow-up with your primary care provider for further evaluation and management of your symptoms.  Return if you develop any new or worsening symptoms.  Please go to the emergency room for severe symptoms. ?

## 2021-06-15 NOTE — Discharge Instructions (Addendum)
Try nortriptyline instead of trazadone at night. ?

## 2021-06-15 NOTE — ED Provider Notes (Signed)
?[name] ?Provider Note ? ? ?CSN: [id number] ?Arrival date & time: 06/15/21  [id number] ? ?  ? ?History ? ?Chief Complaint  ?Patient presents with  ? Hypertension  ? ? ?Benjamin [name] is a 61 y.o. male. ? ?Pt is a 61 yo male with a pmhx significant for tobacco abuse, hyperlipidemia, peripheral artery disease (s/p left AKA) and htn.  Pt said he has had issues with tinnitus.  He feels that it is related to his bp.  He has been to his pcp who [facility] not until 6/22).  PCP also ordered a carotid artery us which was nl.  Pt came to the ED on 4/11, 4/19, 4/22 and today for the same.  Pt said he was  unable to sleep last night due to the tinnitus.  He was worried his bp was elevated.  He did take his meds this am like usual.  He said he was given a pill in the ED that helped the tinnitus, but did not know what it was.  I have looked back and the only meds given were his usual bp meds. ? ? ?  ? ?Home Medications ?Prior to Admission medications   ?Medication Sig Start Date End Date Taking? Authorizing Provider  ?acetaminophen (TYLENOL) 325 MG tablet Take 1-2 tablets (325-650 mg total) by mouth every 4 (four) hours as needed for mild pain. 12/25/20   Love, Evlyn KannerPamela S, PA-C  ?albuterol (VENTOLIN HFA) 108 (90 Base) MCG/ACT inhaler Inhale 1-2 puffs into the lungs every 6 (six) hours as needed for wheezing or shortness of breath. 06/12/21   Sloan LeiterGray, Samuel A, DO  ?atorvastatin (LIPITOR) 20 MG tablet Take 1 tablet (20 mg total) by mouth daily. 03/23/21   Raulkar, Drema PryKrutika P, MD  ?carvedilol (COREG) 6.25 MG tablet Take 1 tablet (6.25 mg total) by mouth 2 (two) times daily with a meal. 06/01/21 07/31/21  Trifan, Kermit BaloMatthew J, MD  ?clopidogrel (PLAVIX) 75 MG tablet Take 1 tablet (75 mg total) by mouth daily. 05/11/21     ?docusate sodium (COLACE) 100 MG capsule Take 1 capsule (100 mg total) by mouth daily. 03/16/21   Raulkar, Drema PryKrutika P, MD  ?gabapentin (NEURONTIN) 600 MG tablet Take 1  tablet (600 mg total) by mouth 2 (two) times daily. 03/22/21   Raulkar, Drema PryKrutika P, MD  ?guaiFENesin-dextromethorphan (ROBITUSSIN DM) 100-10 MG/5ML syrup Take 15 mLs by mouth every 4 (four) hours as needed for cough. 12/15/20   Lorin Glassahal, Binaya, MD  ?hydrOXYzine (ATARAX) 25 MG tablet Take 1 tablet (25 mg total) by mouth at bedtime for anxiety 06/01/21   Arnette FeltsMoore, Janece, FNP  ?lactose free nutrition (BOOST) LIQD Take 237 mLs by mouth 2 (two) times daily between meals.    [provider]  ?losartan (COZAAR) 25 MG tablet Take 1 tablet (25 mg total) by mouth daily. 03/23/21   Raulkar, Drema PryKrutika P, MD  ?losartan (COZAAR) 25 MG tablet Take 1 tablet (25 mg total) by mouth daily. 06/01/21 07/31/21  Terald Sleeperrifan, Matthew J, MD  ?methocarbamol (ROBAXIN) 500 MG tablet Take 1 tablet (500 mg total) by mouth every 6 (six) hours as needed for muscle spasms. 12/25/20   Jacquelynn CreeLove, Pamela S, PA-C  ?Multiple Vitamin (MULTIVITAMIN WITH MINERALS) TABS tablet Take 1 tablet by mouth daily. 12/16/20   Lorin Glassahal, Binaya, MD  ?senna (SENOKOT) 8.6 MG TABS tablet Take 1 tablet (8.6 mg total) by mouth 2 (two) times daily. 12/25/20   Jacquelynn CreeLove, Pamela S, PA-C  ?traZODone (DESYREL)  50 MG tablet Take 1 tablet (50 mg total) by mouth at bedtime as needed for sleep 03/23/21   Raulkar, Drema Pry, MD  ?   ? ?Allergies    ?Aspirin and Garlic   ? ?Review of Systems   ?Review of Systems  ?HENT:  Positive for tinnitus.   ?All other systems reviewed and are negative. ? ?Physical Exam ?Updated Vital Signs ?BP (!) 141/92 (BP Location: Right Arm)   Pulse 67   Temp 98 ?F (36.7 ?C) (Oral)   Resp 16   Ht 5\' 7"  (1.702 m)   Wt 69 kg   SpO2 95%   BMI 23.82 kg/m?  ?Physical Exam ?Vitals and nursing note reviewed.  ?Constitutional:   ?   Appearance: Normal appearance.  ?HENT:  ?   Head: Normocephalic and atraumatic.  ?   Right Ear: External ear normal.  ?   Left Ear: External ear normal.  ?   Nose: Nose normal.  ?   Mouth/Throat:  ?   Mouth: Mucous membranes are moist.  ?   Pharynx:  Oropharynx is clear.  ?Eyes:  ?   Extraocular Movements: Extraocular movements intact.  ?   Conjunctiva/sclera: Conjunctivae normal.  ?   Pupils: Pupils are equal, round, and reactive to light.  ?Cardiovascular:  ?   Rate and Rhythm: Normal rate and regular rhythm.  ?   Pulses: Normal pulses.  ?   Heart sounds: Normal heart sounds.  ?Pulmonary:  ?   Effort: Pulmonary effort is normal.  ?   Breath sounds: Normal breath sounds.  ?Abdominal:  ?   General: Abdomen is flat. Bowel sounds are normal.  ?   Palpations: Abdomen is soft.  ?Musculoskeletal:  ?   Cervical back: Normal range of motion and neck supple.  ?   Comments: Left aka  ?Skin: ?   General: Skin is warm.  ?   Capillary Refill: Capillary refill takes less than 2 seconds.  ?Neurological:  ?   General: No focal deficit present.  ?   Mental Status: He is alert and oriented to person, place, and time.  ?Psychiatric:     ?   Mood and Affect: Mood normal.     ?   Behavior: Behavior normal.  ? ? ?ED Results / Procedures / Treatments   ?Labs ?(all labs ordered are listed, but only abnormal results are displayed) ?Labs Reviewed  ?CBC - Abnormal; Notable for the following components:  ?    Result Value  ? Hemoglobin 11.6 (*)   ? HCT 36.5 (*)   ? MCV 69.3 (*)   ? MCH 22.0 (*)   ? RDW 19.6 (*)   ? All other components within normal limits  ?BASIC METABOLIC PANEL - Abnormal; Notable for the following components:  ? Glucose, Bld 124 (*)   ? All other components within normal limits  ?TROPONIN I (HIGH SENSITIVITY)  ?TROPONIN I (HIGH SENSITIVITY)  ? ? ?EKG ?None ? ?Radiology ?DG Chest 2 View ? ?Result Date: 06/15/2021 ?CLINICAL DATA:  Chest pain and shortness of breath EXAM: CHEST - 2 VIEW COMPARISON:  Three days ago FINDINGS: Generous lung volumes, possible COPD. There is no edema, consolidation, effusion, or pneumothorax. Normal heart size and mediastinal contours. Density over the heart on the lateral view is best attributed to the cardiac incisura. IMPRESSION: No acute  finding. Electronically Signed   By: 06/17/2021 M.D.   On: 06/15/2021 07:14   ? ?Procedures ?Procedures  ? ? ?Medications Ordered in  ED ?Medications - No data to display ? ?ED Course/ Medical Decision Making/ A&P ?  ?                        ?Medical Decision Making ?Risk ?Prescription drug management. ? ? ?This patient presents to the ED for concern of tinnitus, this involves an extensive number of treatment options, and is a complaint that carries with it a high risk of complications and morbidity.  The differential diagnosis includes vascular, neurologic, eustation tube dysfunction, auditory system d/o, ototoxic meds, presbycusis ? ? ?Co morbidities that complicate the patient evaluation ? ?tobacco abuse, hyperlipidemia, peripheral artery disease (s/p left AKA) and htn ? ? ?Additional history obtained: ? ?Additional history obtained from epic chart review ? ? ?Lab Tests: ? ?I Ordered, and personally interpreted labs.  The pertinent results include:  bmp nl, trop nl, cbc with chronic anemia (hgb 11.6) ? ? ?Imaging Studies ordered: ? ?I ordered imaging studies including cxr  ?I independently visualized and interpreted imaging which showed  ?  ?IMPRESSION:  ?No acute finding.  ?   ? ?I agree with the radiologist interpretation ? ? ?Cardiac Monitoring: ? ?The patient was maintained on a cardiac monitor.  I personally viewed and interpreted the cardiac monitored which showed an underlying rhythm of: nsr ? ? ?Medicines ordered and prescription drug management: ? ?I ordered medication including nortriptyline  for tinnitus  ?Reevaluation of the patient after these medicines showed that the patient stayed the same ?I have reviewed the patients home medicines and have made adjustments as needed ? ? ? ? ?Problem List / ED Course: ? ?Tinnitus:  pt has an appt with ENT, but not until June.  He is encouraged to call and see if he can get that any earlier.   There is a trial which has shown improvement with the use of  nortriptyline.  I will start it because it's interfering with his sleep.  I will have him hold the trazodone and see if the nortriptyline works better. ?Htn:  bp in the 140s to 150s.  No evidence of hypertensive urgency.

## 2021-06-15 NOTE — ED Provider Triage Note (Signed)
Emergency Medicine Provider Triage Evaluation Note ? ?Benjamin [name] , a 61 y.o. male  was evaluated in triage.  Pt complains of high blood pressure.  Patient states that he checked his blood pressure at home and states that it was reading 120/100.  He endorses intermittent headaches with ringing in his ears.  And chest pain but does not have any pain at present.  Seen here on 4/22 with the same complaint ? ?Review of Systems  ?Positive: As above  ?Negative: As above  ? ?Physical Exam  ?There were no vitals taken for this visit. ?Gen:   Awake, no distress   ?Resp:  Normal effort  ?MSK:   Moves extremities without difficulty  ?Other:   ? ?Medical Decision Making  ?Medically screening exam initiated at 6:50 AM.  Appropriate orders placed.  Benjamin [name] was informed that the remainder of the evaluation will be completed by another provider, this initial triage assessment does not replace that evaluation, and the importance of remaining in the ED until their evaluation is complete. ? ? ?  ?Mare Ferrari, PA-C ?06/15/21 [id number] ? ?

## 2021-06-15 NOTE — ED Triage Notes (Signed)
Pt reports ringing in left ear since June. States he is taking blood pressure medication that has not been helping. Reports when his blood pressure decrease, the ringing in the ear stops.  ?Pt was seen in the ED and discharged this morning.  ?

## 2021-06-15 NOTE — ED Provider Notes (Signed)
?[facility] ? ? ? ?CSN: [id number] ?Arrival date & time: 06/15/21  1618 ? ? ?  ? ?History   ?Chief Complaint ?Chief Complaint  ?Patient presents with  ? Tinnitus  ? ? ?HPI ?Benjamin [name] is a 61 y.o. male.  ? ?Patient presents to urgent care for evaluation of his ringing in his left ear that he has had since March 2022. He attributes this to his blood pressure fluctuating. Patient states he bought some pills over the counter last year that helped the tinnitus that started with an "L" but he can't remember the name of the medication and it is no longer helping. Patient has an appointment with [facility] in June, but is requesting my assistance in getting an appointment sooner. States that the tinnitus is very "annoying and irritating". Reports he had his "ear cleaned out one time" and this helped with his ear ringing. He is currently wearing a blood pressure monitor on his left wrist. Patient seen for same complaint multiple times this month including this morning at the Emergency Department. Patient states "they said it was because of my blood pressure and they told me to take my blood pressure pills and it will go away". Patient requesting his left ear to be "cleaned out" again at urgent careDenies any other aggravating or relieving factors. ? ? ? ?Past Medical History:  ?Diagnosis Date  ? Back pain   ? Heavy smoker   ? Hyperlipidemia LDL goal <70   ? Hypertension   ? 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  ? Bilateral femoral artery disease  ? ? ?Patient Active Problem List  ? Diagnosis Date Noted  ? Acute blood loss anemia 12/25/2020  ? Hyponatremia 12/25/2020  ? 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 Protein-calorie malnutrition, severe 12/09/2020  ? Wound cellulitis 12/07/2020  ? 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 Ischemia of lower extremity 10/01/2020  ? 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 PAD (peripheral artery disease) (HCC) 11/13/2019  ? Callus of foot 07/01/2019  ? Intermittent claudication (HCC) 01/20/2017  ? Need for influenza vaccination 01/20/2017  ? Needs smoking cessation education 09/06/2016  ? Abnormal echocardiogram 09/05/2016  ? Abnormal EKG 07/25/2016  ? Preoperative cardiovascular examination 07/25/2016  ? Shortness of breath on exertion 07/25/2016  ? Dyslipidemia, goal LDL below 70 07/25/2016  ? High blood pressure 07/25/2016  ? Abnormal chest x-ray 07/04/2016  ? Peripheral vascular disease (HCC) 07/04/2016  ? Decreased pedal pulses 06/24/2016  ? 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 Heavy smoker 06/24/2016  ? Abnormal weight loss 06/24/2016  ? Foot pain, left 06/24/2016  ? Screening for prostate cancer 06/24/2016  ? ? ?Past Surgical History:  ?Procedure Laterality Date  ? ABDOMINAL AORTOGRAM W/LOWER EXTREMITY N/A 07/12/2016  ? Procedure: Abdominal Aortogram w/Lower Extremity;  Surgeon: Nada Libman, MD;  Location: [facility];  Service: Cardiovascular;  Laterality: N/A;  Bilateral extermity: Patent Renal As. No sig Dz in infrarenal Abd Aorta. Normal Bilat Iliac arteries. R SFA is 100% @ origin - recon in AK-Pop A. R PT A patent. L CFA occluded. L PFA recon @ origin, L SFA occluded w/ recon in AK Pop A.  ? ABDOMINAL AORTOGRAM W/LOWER EXTREMITY N/A 10/15/2019  ? Procedure: ABDOMINAL AORTOGRAM W/LOWER EXTREMITY;  Surgeon: Nada Libman, MD;  Location: [facility];  Service: Cardiovascular;  Laterality: N/A;  ? AMPUTATION Left 10/04/2020  ? Procedure: LEFT ABOVE KNEE AMPUTATION;  Surgeon: Nada LibmanBrabham, Vance W, MD;  [facility];  Service: Vascular;  Laterality: Left;  ? AMPUTATION Left 12/08/2020  ? Procedure: RIGHT ABOVE KNEE AMPUTATION REVISION;  Surgeon: Chuck Hintickson, Christopher S, MD;  [facility];  Service: Vascular;  Laterality: Left;  ? AORTA - BILATERAL FEMORAL ARTERY BYPASS GRAFT N/A 05/08/2020  ? Procedure: AORTA  BIFEMORAL BYPASS GRAFT;  Surgeon: Nada LibmanBrabham, Vance W, MD;  [facility];  Service: Vascular;  Laterality: N/A;  ? COLONOSCOPY  06/2016  ? never  ? ENDARTERECTOMY FEMORAL Left 11/14/2019  ? Procedure: Left groin exploration, Redo left femoral artery exposure;  Surgeon: Nada LibmanBrabham, Vance W, MD;  Location: [facility];  Service: Vascular;  Laterality: Left;  ? FEMORAL-FEMORAL BYPASS GRAFT N/A 11/13/2019  ? Procedure: BYPASS GRAFT FEMORAL-FEMORAL ARTERY RIGHT TO LEFT USING HEMASHIELD GOLD GRAFT 8mm x 30cm;  Surgeon: Nada LibmanBrabham, Vance W, MD;  [facility];  Service: Vascular;  Laterality: N/A;  ? FEMORAL-POPLITEAL BYPASS GRAFT Left 11/13/2019  ? Procedure: LEFT FEMORAL BELOW KNEE-POPLITEAL ARTERY USING NON-REVERSED GREATER SAPHENOUS VEIN;  Surgeon: Nada LibmanBrabham, Vance W, MD;  Location: [facility];  Service: Vascular;  Laterality: Left;  ? FEMORAL-POPLITEAL BYPASS GRAFT Left 05/08/2020  ? Procedure: LEFT REDO BYPASS GRAFT COMMON FEMORAL- BELOW KNEE POPLITEAL ARTERY USING PROPATEN GRAFT;  Surgeon: Nada LibmanBrabham, Vance W, MD;  Location: [facility];  Service: Vascular;  Laterality: Left;  ? I & D EXTREMITY Left 09/07/2020  ? Procedure: IRRIGATION AND DEBRIDEMENT LEFT LEG WITH EXCISION OF LEFT LEG DISTAL BYPASS GRAFT;  Surgeon: Cephus Shellinglark, Christopher J, MD;  [facility];  Service: Vascular;  Laterality: Left;  ? LOWER EXTREMITY ANGIOGRAM Left 11/14/2019  ? Procedure: LEFT LOWER EXTREMITY ANGIOGRAM, BYPASS GRAFT ANGIOPLASTY;  Surgeon: Nada LibmanBrabham, Vance W, MD;  Location[facility];  Service: Vascular;  Laterality: Left;  ? PERIPHERAL VASCULAR INTERVENTION  10/15/2019  ? Procedure: PERIPHERAL VASCULAR INTERVENTION;  Surgeon: Nada LibmanBrabham, Vance W, MD;  [facility];  Service: Cardiovascular;;  Rt Iliac  ? REMOVAL OF GRAFT Left 10/04/2020  ? Procedure: REMOVAL OF LEFT FEMORAL TO POPITEAL BYPASS GRAFT;  Surgeon: Nada LibmanBrabham, Vance W, MD;  Location: [facility];  Service: Vascular;  Laterality: Left;  ? ? ? ? ? ?Home Medications   ? ?Prior to Admission medications    ?Medication Sig Start Date End Date Taking? Authorizing Provider  ?acetaminophen (TYLENOL) 325 MG tablet Take 1-2 tablets (325-650 mg total) by mouth every 4 (four) hours as needed for mild pain. 12/25/20   Love, Evlyn KannerPamela S, PA-C  ?albuterol (VENTOLIN HFA) 108 (90 Base) MCG/ACT inhaler Inhale 1-2 puffs into the lungs every 6 (six) hours as needed for wheezing or shortness of breath. 06/12/21   Sloan LeiterGray, Samuel A, DO  ?atorvastatin (LIPITOR) 20 MG tablet Take 1 tablet (20 mg total) by mouth daily. 03/23/21   Raulkar, Drema PryKrutika P, MD  ?carvedilol (COREG) 6.25 MG tablet Take 1 tablet (6.25 mg total) by mouth 2 (two) times daily with a meal. 06/01/21 07/31/21  Trifan, Kermit BaloMatthew J, MD  ?clopidogrel (PLAVIX) 75 MG tablet Take 1 tablet (75 mg total) by mouth daily. 05/11/21     ?docusate sodium (COLACE) 100 MG capsule Take 1 capsule (100 mg total) by mouth daily. 03/16/21   Raulkar, Drema PryKrutika P, MD  ?gabapentin (NEURONTIN) 600 MG tablet Take 1 tablet (600 mg total) by mouth 2 (two) times daily. 03/22/21   Raulkar, Drema PryKrutika P, MD  ?guaiFENesin-dextromethorphan [facility](ROBITUSSIN DM)  100-10 MG/5ML syrup Take 15 mLs by mouth every 4 (four) hours as needed for cough. 12/15/20   Lorin Glass, MD  ?hydrOXYzine (ATARAX) 25 MG tablet Take 1 tablet (25 mg total) by mouth at bedtime for anxiety 06/01/21   Arnette Felts, FNP  ?lactose free nutrition (BOOST) LIQD Take 237 mLs by mouth 2 (two) times daily between meals.    [provider]  ?losartan (COZAAR) 25 MG tablet Take 1 tablet (25 mg total) by mouth daily. 03/23/21   Raulkar, Drema Pry, MD  ?losartan (COZAAR) 25 MG tablet Take 1 tablet (25 mg total) by mouth daily. 06/01/21 07/31/21  Terald Sleeper, MD  ?methocarbamol (ROBAXIN) 500 MG tablet Take 1 tablet (500 mg total) by mouth every 6 (six) hours as needed for muscle spasms. 12/25/20   Jacquelynn Cree, PA-C  ?Multiple Vitamin (MULTIVITAMIN WITH MINERALS) TABS tablet Take 1 tablet by mouth daily. 12/16/20   Lorin Glass, MD  ?nortriptyline  (PAMELOR) 50 MG capsule Take 2 capsules (100 mg total) by mouth at bedtime. 06/15/21   Jacalyn Lefevre, MD  ?senna (SENOKOT) 8.6 MG TABS tablet Take 1 tablet (8.6 mg total) by mouth 2 (two) times daily. 11/4

## 2021-06-16 ENCOUNTER — Encounter: Payer: Self-pay | Admitting: Rehabilitation

## 2021-06-16 ENCOUNTER — Encounter: Payer: Self-pay | Admitting: Occupational Therapy

## 2021-06-16 ENCOUNTER — Ambulatory Visit: Payer: Medicaid Other | Admitting: Occupational Therapy

## 2021-06-16 ENCOUNTER — Ambulatory Visit: Payer: Medicaid Other | Admitting: Rehabilitation

## 2021-06-16 ENCOUNTER — Other Ambulatory Visit: Payer: Self-pay

## 2021-06-16 DIAGNOSIS — R2681 Unsteadiness on feet: Secondary | ICD-10-CM

## 2021-06-16 DIAGNOSIS — M6281 Muscle weakness (generalized): Secondary | ICD-10-CM

## 2021-06-16 DIAGNOSIS — R2689 Other abnormalities of gait and mobility: Secondary | ICD-10-CM | POA: Diagnosis not present

## 2021-06-16 DIAGNOSIS — R293 Abnormal posture: Secondary | ICD-10-CM

## 2021-06-16 NOTE — Therapy (Signed)
?OUTPATIENT OCCUPATIONAL THERAPY TREATMENT NOTE ? ? ?Patient Name: Benjamin [name] ?MRN: [id number] ?DOB:07/27/1960, 61 y.o., male ?Today's Date: 06/16/2021 ? ?PCP: Arnette FeltsMoore, Janece, FNP ?REFERRING PROVIDER: Horton Chinaulkar, Krutika P, MD  ? ?END OF SESSION:  ? OT End of Session - 06/16/21 1302   ? ? Visit Number 2   ? Number of Visits 17   ? Date for OT Re-Evaluation 08/03/21   ? Authorization Type  [employer] - awaiting approval   ? OT Start Time 1310   ? OT Stop Time 1350   ? OT Time Calculation (min) 40 min   ? Activity Tolerance Patient tolerated treatment well   ? Behavior During Therapy [facility]WFL for tasks assessed/performed   ? ?  ?  ? ?  ? ? ?Past Medical History:  ?Diagnosis Date  ? Back pain   ? Heavy smoker   ? Hyperlipidemia LDL goal <70   ? Hypertension   ? 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  ? Bilateral femoral artery disease  ? ?Past Surgical History:  ?Procedure Laterality Date  ? ABDOMINAL AORTOGRAM W/LOWER EXTREMITY N/A 07/12/2016  ? Procedure: Abdominal Aortogram w/Lower Extremity;  Surgeon: Nada LibmanBrabham, Vance W, MD;  [facility];  Service: Cardiovascular;  Laterality: N/A;  Bilateral extermity: Patent Renal As. No sig Dz in infrarenal Abd Aorta. Normal Bilat Iliac arteries. R SFA is 100% @ origin - recon in AK-Pop A. R PT A patent. L CFA occluded. L PFA recon @ origin, L SFA occluded w/ recon in AK Pop A.  ? ABDOMINAL AORTOGRAM W/LOWER EXTREMITY N/A 10/15/2019  ? Procedure: ABDOMINAL AORTOGRAM W/LOWER EXTREMITY;  Surgeon: Nada LibmanBrabham, Vance W, MD;  [facility];  Service: Cardiovascular;  Laterality: N/A;  ? AMPUTATION Left 10/04/2020  ? Procedure: LEFT ABOVE KNEE AMPUTATION;  Surgeon: Nada LibmanBrabham, Vance W, MD;  [facility];  Service: Vascular;  Laterality: Left;  ? AMPUTATION Left 12/08/2020  ? Procedure: RIGHT ABOVE KNEE AMPUTATION REVISION;  Surgeon: Chuck Hintickson, Christopher S, MD;  [facility];  Service: Vascular;  Laterality: Left;  ? AORTA - BILATERAL FEMORAL  ARTERY BYPASS GRAFT N/A 05/08/2020  ? Procedure: AORTA BIFEMORAL BYPASS GRAFT;  Surgeon: Nada LibmanBrabham, Vance W, MD;  [facility];  Service: Vascular;  Laterality: N/A;  ? COLONOSCOPY  06/2016  ? never  ? ENDARTERECTOMY FEMORAL Left 11/14/2019  ? Procedure: Left groin exploration, Redo left femoral artery exposure;  Surgeon: Nada LibmanBrabham, Vance W, MD;  [facility];  Service: Vascular;  Laterality: Left;  ? FEMORAL-FEMORAL BYPASS GRAFT N/A 11/13/2019  ? Procedure: BYPASS GRAFT FEMORAL-FEMORAL ARTERY RIGHT TO LEFT USING HEMASHIELD GOLD GRAFT 8mm x 30cm;  Surgeon: Nada LibmanBrabham, Vance W, MD;  [facility];  Service: Vascular;  Laterality: N/A;  ? FEMORAL-POPLITEAL BYPASS GRAFT Left 11/13/2019  ? Procedure: LEFT FEMORAL BELOW KNEE-POPLITEAL ARTERY USING NON-REVERSED GREATER SAPHENOUS VEIN;  Surgeon: Nada LibmanBrabham, Vance W, MD;  [facility];  Service: Vascular;  Laterality: Left;  ? FEMORAL-POPLITEAL BYPASS GRAFT Left 05/08/2020  ? Procedure: LEFT REDO BYPASS GRAFT COMMON FEMORAL- BELOW KNEE POPLITEAL ARTERY USING PROPATEN GRAFT;  Surgeon: Nada LibmanBrabham, Vance W, MD;  [facility];  Service: Vascular;  Laterality: Left;  ? I & D EXTREMITY Left 09/07/2020  ? Procedure: IRRIGATION AND DEBRIDEMENT LEFT LEG WITH EXCISION OF LEFT LEG DISTAL BYPASS GRAFT;  Surgeon: Cephus Shellinglark, Christopher J, MD;  [facility];  Service: Vascular;  Laterality: Left;  ? LOWER EXTREMITY ANGIOGRAM Left 11/14/2019  ? Procedure: LEFT LOWER EXTREMITY  ANGIOGRAM, BYPASS GRAFT ANGIOPLASTY;  Surgeon: Nada Libman, MD;  Location: [facility];  Service: Vascular;  Laterality: Left;  ? PERIPHERAL VASCULAR INTERVENTION  10/15/2019  ? Procedure: PERIPHERAL VASCULAR INTERVENTION;  Surgeon: Nada Libman, MD;  [facility];  Service: Cardiovascular;;  Rt Iliac  ? REMOVAL OF GRAFT Left 10/04/2020  ? Procedure: REMOVAL OF LEFT FEMORAL TO POPITEAL BYPASS GRAFT;  Surgeon: Nada Libman, MD;  [facility];  Service: Vascular;  Laterality: Left;  ? ?Patient  Active Problem List  ? Diagnosis Date Noted  ? Acute blood loss anemia 12/25/2020  ? Hyponatremia 12/25/2020  ? 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 Protein-calorie malnutrition, severe 12/09/2020  ? Wound cellulitis 12/07/2020  ? 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 Ischemia of lower extremity 10/01/2020  ? 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 PAD (peripheral artery disease) (HCC) 11/13/2019  ? Callus of foot 07/01/2019  ? Intermittent claudication (HCC) 01/20/2017  ? Need for influenza vaccination 01/20/2017  ? Needs smoking cessation education 09/06/2016  ? Abnormal echocardiogram 09/05/2016  ? Abnormal EKG 07/25/2016  ? Preoperative cardiovascular examination 07/25/2016  ? Shortness of breath on exertion 07/25/2016  ? Dyslipidemia, goal LDL below 70 07/25/2016  ? High blood pressure 07/25/2016  ? Abnormal chest x-ray 07/04/2016  ? Peripheral vascular disease (HCC) 07/04/2016  ? Decreased pedal pulses 06/24/2016  ? 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 Heavy smoker 06/24/2016  ? Abnormal weight loss 06/24/2016  ? Foot pain, left 06/24/2016  ? Screening for prostate cancer 06/24/2016  ? ? ?ONSET DATE: 12/08/20 (Lt AKA revision) ? ?REFERRING DIAG: C14.481E (ICD-10-CM) - Unilateral AKA, left (HCC)  ? ?THERAPY DIAG:  ?Unsteadiness on feet ? ?Muscle weakness (generalized) ? ? ?PERTINENT HISTORY: necrotic left above-knee amputation site. 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 PMH includes PAD, HLD, HTN, abnormal glucose.  ? ?PRECAUTIONS: **HTN, fall risk, Lt AKA w/ prosthestic ? ?SUBJECTIVE: I haven't tried dressing with the prosthesis ? ?PAIN:  ?Are you having pain? No ? ? ? ? ?OBJECTIVE:  ? ?TODAY'S TREATMENT: ?Pt practiced doffing pants and prosthetic leg w/ cues for proper sequencing. Pt then simulated tub transfer using tub transfer bench while ambulating to tub bench w/ Rt leg (w/o prosthesis).  Pt then returned to seat and donned prosthesis, pants and shoes. Required min assist for balance while standing to don velcro of prosthesis around waist and don pants over hips and fasten.  ? ?Pt issued handouts on recommended DME an A/E (Tub bench and walker basket or walker tray). Pt told where or how to purchase, however instructed to wait on purchasing tub bench as MCD may pay for this with MD referral.  ? ?PATIENT EDUCATION: ?Education details: sequencing for LE dressing w/ prosthesis, tub transfer w/ bench, and A/E and DME recommendations ?Person educated: Patient ?Education method: explanation, demo, handouts for A/E ?Education comprehension: verbalized understanding, returned demonstration, verbal cues required, and needs further education ? ? ?GOALS: ?Goals reviewed with patient? Yes ?  ?SHORT TERM GOALS: Target date: 07/06/2021 ?  ?Independent with UB strengthening HEP  ?Baseline: DEPENDENT ?Goal status: INITIAL ?  ?2.  Pt will verbalize understanding of DME and A/E to increase safety and independence with ADLS ?Baseline: DEPENDENT ?Goal status: ONGOING ?  ?3.  Pt will perform LE dressing w/ prosthesis on I'ly w/ AE prn ?Baseline: dependent with prosthesis ?Goal status: ONGOING ?  ?  LONG TERM GOALS: Target date: 08/03/2021 ?  ?Pt to perform dynamic standing tasks/IADLS using prosthesis (washing dishes, folding clothes, light cleaning) ?Baseline: DEPENDENT ?Goal status: INITIAL ?  ?2.  Pt to wear prosthesis at least 50% of the time during the day for ADLS/IADLS ?Baseline: Not currently wearing ?Goal status: INITIAL ?  ?3. Pt will simulate tub transfer with proper DME safety and independently ?            Baseline: unable - does not have proper DME ?            Goal status: ONGOING - practiced in clinic w/o difficulty 06/16/21 ?  ?  ?ASSESSMENT: ?  ?CLINICAL IMPRESSION: ?Pt progressing towards goals and greater independence with LE dressing with prosthesis. Pt demo tub transfer safely with tub bench. ?   ?PERFORMANCE DEFICITS in functional skills including ADLs, IADLs, strength, mobility, balance, body mechanics, endurance, and decreased knowledge of use of DME ?  ?IMPAIRMENTS are limiting patient from ADLs a

## 2021-06-16 NOTE — Therapy (Signed)
?OUTPATIENT PHYSICAL THERAPY PROSTHETIC TREATMENT NOTE ? ? ?Patient Name: Benjamin [name] ?MRN: [id number] ?DOB:03/28/1960, 61 y.o., male ?Today's Date: 06/16/2021 ? ?PCP: Arnette FeltsMoore, Janece, FNP ?REFERRING PROVIDER: Arnette FeltsMoore, Janece, FNP ? ? PT End of Session - 06/16/21 1223   ? ? Visit Number 2   ? Number of Visits 16   ? Date for PT Re-Evaluation 08/03/21   ? Authorization Type [employer] (15 visits)   ? Progress Note Due on Visit 16   ? PT Start Time 1220   ? PT Stop Time 1310   ? PT Time Calculation (min) 50 min   ? Equipment Utilized During Treatment Gait belt   ? Activity Tolerance Patient tolerated treatment well   ? [facility] tasks assessed/performed   ? ?  ?  ? ?  ? ? ?Past Medical History:  ?Diagnosis Date  ? Back pain   ? Heavy smoker   ? Hyperlipidemia LDL goal <70   ? Hypertension   ? 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  ? Bilateral femoral artery disease  ? ?Past Surgical History:  ?Procedure Laterality Date  ? ABDOMINAL AORTOGRAM W/LOWER EXTREMITY N/A 07/12/2016  ? Procedure: Abdominal Aortogram w/Lower Extremity;  Surgeon: Nada LibmanBrabham, Vance W, MD;  [facility];  Service: Cardiovascular;  Laterality: N/A;  Bilateral extermity: Patent Renal As. No sig Dz in infrarenal Abd Aorta. Normal Bilat Iliac arteries. R SFA is 100% @ origin - recon in AK-Pop A. R PT A patent. L CFA occluded. L PFA recon @ origin, L SFA occluded w/ recon in AK Pop A.  ? ABDOMINAL AORTOGRAM W/LOWER EXTREMITY N/A 10/15/2019  ? Procedure: ABDOMINAL AORTOGRAM W/LOWER EXTREMITY;  Surgeon: Nada LibmanBrabham, Vance W, MD;  [facility];  Service: Cardiovascular;  Laterality: N/A;  ? AMPUTATION Left 10/04/2020  ? Procedure: LEFT ABOVE KNEE AMPUTATION;  Surgeon: Nada LibmanBrabham, Vance W, MD;  [facility];  Service: Vascular;  Laterality: Left;  ? AMPUTATION Left 12/08/2020  ? Procedure: RIGHT ABOVE KNEE AMPUTATION REVISION;  Surgeon: Chuck Hintickson, Christopher S, MD;  [facility];  Service:  Vascular;  Laterality: Left;  ? AORTA - BILATERAL FEMORAL ARTERY BYPASS GRAFT N/A 05/08/2020  ? Procedure: AORTA BIFEMORAL BYPASS GRAFT;  Surgeon: Nada LibmanBrabham, Vance W, MD;  [facility];  Service: Vascular;  Laterality: N/A;  ? COLONOSCOPY  06/2016  ? never  ? ENDARTERECTOMY FEMORAL Left 11/14/2019  ? Procedure: Left groin exploration, Redo left femoral artery exposure;  Surgeon: Nada LibmanBrabham, Vance W, MD;  [facility];  Service: Vascular;  Laterality: Left;  ? FEMORAL-FEMORAL BYPASS GRAFT N/A 11/13/2019  ? Procedure: BYPASS GRAFT FEMORAL-FEMORAL ARTERY RIGHT TO LEFT USING HEMASHIELD GOLD GRAFT 8mm x 30cm;  Surgeon: Nada LibmanBrabham, Vance W, MD;  [facility];  Service: Vascular;  Laterality: N/A;  ? FEMORAL-POPLITEAL BYPASS GRAFT Left 11/13/2019  ? Procedure: LEFT FEMORAL BELOW KNEE-POPLITEAL ARTERY USING NON-REVERSED GREATER SAPHENOUS VEIN;  Surgeon: Nada LibmanBrabham, Vance W, MD;  [facility];  Service: Vascular;  Laterality: Left;  ? FEMORAL-POPLITEAL BYPASS GRAFT Left 05/08/2020  ? Procedure: LEFT REDO BYPASS GRAFT COMMON FEMORAL- BELOW KNEE POPLITEAL ARTERY USING PROPATEN GRAFT;  Surgeon: Nada LibmanBrabham, Vance W, MD;  [facility];  Service: Vascular;  Laterality: Left;  ? I & D EXTREMITY Left 09/07/2020  ? Procedure: IRRIGATION AND DEBRIDEMENT LEFT LEG WITH EXCISION OF LEFT LEG DISTAL BYPASS GRAFT;  Surgeon: Cephus Shellinglark, Christopher J, MD;  [facility];  Service: Vascular;  Laterality: Left;  ?  LOWER EXTREMITY ANGIOGRAM Left 11/14/2019  ? Procedure: LEFT LOWER EXTREMITY ANGIOGRAM, BYPASS GRAFT ANGIOPLASTY;  Surgeon: Nada Libman, MD;  [facility];  Service: Vascular;  Laterality: Left;  ? PERIPHERAL VASCULAR INTERVENTION  10/15/2019  ? Procedure: PERIPHERAL VASCULAR INTERVENTION;  Surgeon: Nada Libman, MD;  [facility];  Service: Cardiovascular;;  Rt Iliac  ? REMOVAL OF GRAFT Left 10/04/2020  ? Procedure: REMOVAL OF LEFT FEMORAL TO POPITEAL BYPASS GRAFT;  Surgeon: Nada Libman, MD;  Location:  [facility] OR;  Service: Vascular;  Laterality: Left;  ? ?Patient Active Problem List  ? Diagnosis Date Noted  ? Acute blood loss anemia 12/25/2020  ? Hyponatremia 12/25/2020  ? 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 Protein-calorie malnutrition, severe 12/09/2020  ? Wound cellulitis 12/07/2020  ? 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 Ischemia of lower extremity 10/01/2020  ? 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 PAD (peripheral artery disease) (HCC) 11/13/2019  ? Callus of foot 07/01/2019  ? Intermittent claudication (HCC) 01/20/2017  ? Need for influenza vaccination 01/20/2017  ? Needs smoking cessation education 09/06/2016  ? Abnormal echocardiogram 09/05/2016  ? Abnormal EKG 07/25/2016  ? Preoperative cardiovascular examination 07/25/2016  ? Shortness of breath on exertion 07/25/2016  ? Dyslipidemia, goal LDL below 70 07/25/2016  ? High blood pressure 07/25/2016  ? Abnormal chest x-ray 07/04/2016  ? Peripheral vascular disease (HCC) 07/04/2016  ? Decreased pedal pulses 06/24/2016  ? 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 Heavy smoker 06/24/2016  ? Abnormal weight loss 06/24/2016  ? Foot pain, left 06/24/2016  ? Screening for prostate cancer 06/24/2016  ? ? ?ONSET DATE: 12/08/2020 ?  ?REFERRING DIAG: L38.101B (ICD-10-CM) - Unilateral AKA, left (HCC)  ?  ?THERAPY DIAG:  ?Other abnormalities of gait and mobility - Plan: PT plan of care cert/re-cert ?  ?Unsteadiness on feet - Plan: PT plan of care cert/re-cert ?  ?Hx of AKA (above knee amputation), left (HCC) - Plan: PT plan of care cert/re-cert ?  ?SUBJECTIVE:  ?  ?SUBJECTIVE STATEMENT: ?Pt reports he is wearing prosthesis 2hrs, 2x/day consistently but is going to the gym and working out without leg donned.    ?Pt accompanied by: self ?  ?PERTINENT HISTORY: PMH significant for HTN, HLD, heavy smoking, peripheral artery disease s/p bilateral  femoral artery bypass graft  ? ? ?PAIN:  ?Are you having pain? No (not pain just ringing in L ear rates it at 9/10) Has ENT appt in June.  ? ? ?PROSTHETIC TRAINING: ?Wear time currently doing 2 hrs, 2x/day, instructed to go up to 3hrs, 2x/day, checking skin when he removes liner for any redness, areas of pressure, etc.   ?Limb condition Good condition today, incision is fully healed.  ?Weight bearing He is able to WB for up to 5-6 mins at a time without issue, however he heavily relies on UE support with little WB through prosthesis.  ?Provided extensive cuing and education on socks and which colors are which thickness and how to adjust as needed.  Educated to begin with socks if having discomfort esp on distal end of limb or up in groin. PT and pt both note that prosthesis is a little long and PT had pt stand with walker with pelvis being slightly higher on L than R.  Called during session and got pt an apt at [facility] on 5/1 at 9am.  He was supposed to have an appt this past Monday but did not go.  Educated on importance of keeping prosthetic appts for adjustments and to address any concerns.    ? ? PATIENT EDUCATION: ?Education details: Skin check, Correct ply sock adjustment, Propper donning, Propper doffing, Proper wear schedule/adjustment, and Proper weight-bearing schedule/adjustment ?Person educated: Patient ?Education method: Explanation, Demonstration, and Verbal cues ?Education comprehension: verbalized understanding, returned demonstration, verbal cues required, and needs further education ? ?TODAY'S TREATMENT:  ?  ?TREATMENT:  ?PATIENT EDUCATED ON FOLLOWING PROSTHETIC CARE: ?Prosthetic wear tolerance: 2 hours, 2x/day, 7 days/week ?Prosthetic weight bearing tolerance: 5 minutes ?Other education  Skin check, Residual limb care, Prosthetic cleaning, Propper donning, Proper doffing, Proper wear schedule/adjustment, Proper weight-bearing schedule/adjustment, and Comments educated on tightening strap in front  after standing and walking for proper fit, pt educated on doing some weight shifts laterally before starting to walk and adjusting strap in front to keep it tight. ? ?Pt arrived without prosthesis donned,

## 2021-06-17 ENCOUNTER — Other Ambulatory Visit: Payer: Self-pay | Admitting: Nurse Practitioner

## 2021-06-17 ENCOUNTER — Telehealth: Payer: Self-pay

## 2021-06-17 NOTE — Patient Instructions (Signed)
Visit Information ? ?Mr. Hattabaugh was given information about Medicaid Managed Care team care coordination services as a part of their [employer] [facility] Medicaid benefit. [name] verbally consented to engagement with the [facility] Managed Care team.  ? ?06/17/2021 ? ?This RN Care Manager received a voicemail message from patient this morning regarding his upcoming ENT appointment on August 12, 2021.   ?  ?This RN Care Manager returned patient's telephone call.  Patient requested I contact [facility] to see if the patient's upcoming appointment on August 12, 2021 can be moved to an earlier date.  I placed a telephone call to [facility] at [phone] and spoke with the office staff.  I explained that patient is in need of an earlier appointment if possible because of patient continues to have very bothersome tinnitus and has had repeated ED visits for this issue.  The ENT office staff said patient would be put on wait/cancellation list with the hope they can offer an earlier appointment to the patient. ?  ?This RN placed a telephone call to the patient and explained the above information.  Patient in agreement with this plan.  Patient stated he may make an office appointment with PCP to re-evaluate his on-going tinnitus. ?  ?Virgina Norfolk RN, BSN ?Community Care Coordinator ?[place]  [facility] ?Mobile: [phone]  ? ? ?Following is a copy of your plan of care:  ?There are no care plans that you recently modified to display for this patient. ? ?  ?

## 2021-06-17 NOTE — Patient Outreach (Signed)
Care Coordination ? ?06/17/2021 ? ?Benjamin [name] ?20-Feb-1961 ?[id number] ? ? ?This RN Care Manager received a voicemail message from patient this morning regarding his upcoming ENT appointment on August 12, 2021.   ? ?This RN Care Manager returned patient's telephone call.  Patient requested I contact [facility] to see if the patient's upcoming appointment on August 12, 2021 can be moved to an earlier date.  I placed a telephone call to [facility] at [phone] and spoke with the office staff.  I explained that patient is in need of an earlier appointment if possible because of patient continues to have very bothersome tinnitus and has had repeated ED visits for this issue.  The ENT office staff said patient would be put on wait/cancellation list with the hope they can offer an earlier appointment to the patient. ? ?This RN placed a telephone call to the patient and explained the above information.  Patient in agreement with this plan.  Patient stated he may make an office appointment with PCP to re-evaluate his on-going tinnitus. ? ?[name] RN, BSN ?Community Care Coordinator ?[facility] ?Mobile: [phone]  ?

## 2021-06-19 ENCOUNTER — Emergency Department (HOSPITAL_COMMUNITY)
Admission: EM | Admit: 2021-06-19 | Discharge: 2021-06-19 | Disposition: A | Discharge status: Home / Self Care | Payer: Medicaid Other | Attending: Emergency Medicine | Admitting: Emergency Medicine

## 2021-06-19 ENCOUNTER — Encounter (HOSPITAL_COMMUNITY): Payer: Self-pay | Admitting: Emergency Medicine

## 2021-06-19 ENCOUNTER — Emergency Department (HOSPITAL_COMMUNITY): Payer: Medicaid Other

## 2021-06-19 ENCOUNTER — Other Ambulatory Visit: Payer: Self-pay

## 2021-06-19 DIAGNOSIS — Z7902 Long term (current) use of antithrombotics/antiplatelets: Secondary | ICD-10-CM | POA: Diagnosis not present

## 2021-06-19 DIAGNOSIS — R0789 Other chest pain: Secondary | ICD-10-CM | POA: Insufficient documentation

## 2021-06-19 DIAGNOSIS — Z79899 Other long term (current) drug therapy: Secondary | ICD-10-CM | POA: Insufficient documentation

## 2021-06-19 DIAGNOSIS — I2510 Atherosclerotic heart disease of native coronary artery without angina pectoris: Secondary | ICD-10-CM | POA: Insufficient documentation

## 2021-06-19 DIAGNOSIS — I10 Essential (primary) hypertension: Secondary | ICD-10-CM | POA: Diagnosis not present

## 2021-06-19 LAB — CBC
HCT: 35 % — ABNORMAL LOW (ref 39.0–52.0)
Hemoglobin: 11.4 g/dL — ABNORMAL LOW (ref 13.0–17.0)
MCH: 22.1 pg — ABNORMAL LOW (ref 26.0–34.0)
MCHC: 32.6 g/dL (ref 30.0–36.0)
MCV: 67.8 fL — ABNORMAL LOW (ref 80.0–100.0)
Platelets: 342 10*3/uL (ref 150–400)
RBC: 5.16 MIL/uL (ref 4.22–5.81)
RDW: 19.2 % — ABNORMAL HIGH (ref 11.5–15.5)
WBC: 5.1 10*3/uL (ref 4.0–10.5)
nRBC: 0 % (ref 0.0–0.2)

## 2021-06-19 LAB — BASIC METABOLIC PANEL
Anion gap: 7 (ref 5–15)
BUN: 6 mg/dL — ABNORMAL LOW (ref 8–23)
CO2: 21 mmol/L — ABNORMAL LOW (ref 22–32)
Calcium: 8.8 mg/dL — ABNORMAL LOW (ref 8.9–10.3)
Chloride: 108 mmol/L (ref 98–111)
Creatinine, Ser: 0.82 mg/dL (ref 0.61–1.24)
GFR, Estimated: >60 mL/min (ref >60)
Glucose, Bld: 113 mg/dL — ABNORMAL HIGH (ref 70–99)
Potassium: 3.4 mmol/L — ABNORMAL LOW (ref 3.5–5.1)
Sodium: 136 mmol/L (ref 135–145)

## 2021-06-19 LAB — TROPONIN I (HIGH SENSITIVITY)
Troponin I (High Sensitivity): 7 ng/L (ref <18)
Troponin I (High Sensitivity): 7 ng/L (ref <18)

## 2021-06-19 MED ORDER — CARVEDILOL 3.125 MG PO TABS
6.2500 mg | ORAL_TABLET | Freq: Two times a day (BID) | ORAL | Status: DC
  Administered 2021-06-19: 6.25 mg via ORAL
  Filled 2021-06-19: qty 2

## 2021-06-19 MED ORDER — LOSARTAN POTASSIUM 50 MG PO TABS
25.0000 mg | ORAL_TABLET | Freq: Every day | ORAL | Status: DC
  Administered 2021-06-19: 25 mg via ORAL
  Filled 2021-06-19: qty 1

## 2021-06-19 MED ORDER — ACETAMINOPHEN 325 MG PO TABS
650.0000 mg | ORAL_TABLET | Freq: Four times a day (QID) | ORAL | Status: DC | PRN
Start: 2021-06-19 — End: 2021-06-19
  Administered 2021-06-19: 650 mg via ORAL
  Filled 2021-06-19: qty 2

## 2021-06-19 NOTE — ED Triage Notes (Signed)
Pt reported to ED with c/o central chest pain that radiates into left jaw x2 days. Also states he has been having ringing in his ear. Pt initially complained of having low blood pressure. And elevated temperature that he could not get down. Pt reassured that temp is okay and BP is higher than recommended.  ?

## 2021-06-19 NOTE — ED Provider Notes (Signed)
?[name] EMERGENCY DEPARTMENT ?Provider Note ? ? ?CSN: [id number] ?Arrival date & time: 06/19/21  2458 ? ?  ? ?History ? ?Chief Complaint  ?Patient presents with  ? Chest Pain  ? ? ?Benjamin [name] is a 61 y.o. male. ? ?Pt complains of his blood pressure being high today.  Pt did not take his medication this morning.  Pt states he had some discomfort in his chest and a headache.  Pt reports some head congestion  ? ?The history is provided by the patient. No language interpreter was used.  ?Chest Pain ?Pain location:  L chest ?Pain quality: aching   ?Pain radiates to:  Does not radiate ?Pain severity:  Moderate ?Onset quality:  Gradual ?Duration:  1 day ?Timing:  Constant ?Progression:  Worsening ?Chronicity:  New ?Context: not breathing   ?Relieved by:  Nothing ?Ineffective treatments:  None tried ?Associated symptoms: no abdominal pain and no nausea   ?Risk factors: coronary artery disease, high cholesterol and hypertension   ? ?  ? ?Home Medications ?Prior to Admission medications   ?Medication Sig Start Date End Date Taking? Authorizing Provider  ?acetaminophen (TYLENOL) 325 MG tablet Take 1-2 tablets (325-650 mg total) by mouth every 4 (four) hours as needed for mild pain. 12/25/20   Love, Evlyn Kanner, PA-C  ?albuterol (VENTOLIN HFA) 108 (90 Base) MCG/ACT inhaler Inhale 1-2 puffs into the lungs every 6 (six) hours as needed for wheezing or shortness of breath. 06/12/21   Sloan Leiter, DO  ?atorvastatin (LIPITOR) 20 MG tablet Take 1 tablet (20 mg total) by mouth daily. 03/23/21   Raulkar, Drema Pry, MD  ?carvedilol (COREG) 6.25 MG tablet Take 1 tablet (6.25 mg total) by mouth 2 (two) times daily with a meal. 06/01/21 07/31/21  Trifan, Kermit Balo, MD  ?clopidogrel (PLAVIX) 75 MG tablet Take 1 tablet (75 mg total) by mouth daily. 05/11/21     ?docusate sodium (COLACE) 100 MG capsule Take 1 capsule (100 mg total) by mouth daily. 03/16/21   Raulkar, Drema Pry, MD  ?gabapentin (NEURONTIN) 600 MG tablet Take 1  tablet (600 mg total) by mouth 2 (two) times daily. 03/22/21   Raulkar, Drema Pry, MD  ?guaiFENesin-dextromethorphan (ROBITUSSIN DM) 100-10 MG/5ML syrup Take 15 mLs by mouth every 4 (four) hours as needed for cough. 12/15/20   Lorin Glass, MD  ?hydrOXYzine (ATARAX) 25 MG tablet Take 1 tablet (25 mg total) by mouth at bedtime for anxiety 06/01/21   Arnette Felts, FNP  ?lactose free nutrition (BOOST) LIQD Take 237 mLs by mouth 2 (two) times daily between meals.    [provider]  ?losartan (COZAAR) 25 MG tablet Take 1 tablet (25 mg total) by mouth daily. 03/23/21   Raulkar, Drema Pry, MD  ?losartan (COZAAR) 25 MG tablet Take 1 tablet (25 mg total) by mouth daily. 06/01/21 07/31/21  Terald Sleeper, MD  ?methocarbamol (ROBAXIN) 500 MG tablet Take 1 tablet (500 mg total) by mouth every 6 (six) hours as needed for muscle spasms. 12/25/20   Jacquelynn Cree, PA-C  ?Multiple Vitamin (MULTIVITAMIN WITH MINERALS) TABS tablet Take 1 tablet by mouth daily. 12/16/20   Lorin Glass, MD  ?nortriptyline (PAMELOR) 50 MG capsule Take 2 capsules (100 mg total) by mouth at bedtime. 06/15/21   Jacalyn Lefevre, MD  ?senna (SENOKOT) 8.6 MG TABS tablet Take 1 tablet (8.6 mg total) by mouth 2 (two) times daily. 12/25/20   Jacquelynn Cree, PA-C  ?   ? ?Allergies    ?  Aspirin and Garlic   ? ?Review of Systems   ?Review of Systems  ?Cardiovascular:  Positive for chest pain.  ?Gastrointestinal:  Negative for abdominal pain and nausea.  ?All other systems reviewed and are negative. ? ?Physical Exam ?Updated Vital Signs ?BP (!) 167/101   Pulse 63   Temp 97.6 ?F (36.4 ?C) (Oral)   Resp (!) 21   SpO2 97%  ?Physical Exam ?Vitals and nursing note reviewed.  ?Constitutional:   ?   General: He is not in acute distress. ?   Appearance: He is well-developed.  ?HENT:  ?   Head: Normocephalic and atraumatic.  ?Eyes:  ?   Conjunctiva/sclera: Conjunctivae normal.  ?   Pupils: Pupils are equal, round, and reactive to light.  ?Cardiovascular:  ?   Rate  and Rhythm: Normal rate and regular rhythm.  ?   Heart sounds: Normal heart sounds. No murmur heard. ?Pulmonary:  ?   Effort: Pulmonary effort is normal. No respiratory distress.  ?   Breath sounds: Normal breath sounds.  ?Abdominal:  ?   Palpations: Abdomen is soft.  ?   Tenderness: There is no abdominal tenderness.  ?Musculoskeletal:     ?   General: No swelling. Normal range of motion.  ?   Cervical back: Normal range of motion and neck supple.  ?Skin: ?   General: Skin is warm and dry.  ?   Capillary Refill: Capillary refill takes less than 2 seconds.  ?Neurological:  ?   General: No focal deficit present.  ?   Mental Status: He is alert.  ?Psychiatric:     ?   Mood and Affect: Mood normal.  ? ? ?ED Results / Procedures / Treatments   ?Labs ?(all labs ordered are listed, but only abnormal results are displayed) ?Labs Reviewed  ?BASIC METABOLIC PANEL - Abnormal; Notable for the following components:  ?    Result Value  ? Potassium 3.4 (*)   ? CO2 21 (*)   ? Glucose, Bld 113 (*)   ? BUN 6 (*)   ? Calcium 8.8 (*)   ? All other components within normal limits  ?CBC - Abnormal; Notable for the following components:  ? Hemoglobin 11.4 (*)   ? HCT 35.0 (*)   ? MCV 67.8 (*)   ? MCH 22.1 (*)   ? RDW 19.2 (*)   ? All other components within normal limits  ?TROPONIN I (HIGH SENSITIVITY)  ?TROPONIN I (HIGH SENSITIVITY)  ? ? ?EKG ?EKG Interpretation ? ?Date/Time:  Saturday June 19 2021 07:46:36 EDT ?Ventricular Rate:  67 ?PR Interval:  141 ?QRS Duration: 97 ?QT Interval:  526 ?QTC Calculation: 556 ?R Axis:   -35 ?Text Interpretation: Sinus rhythm Left axis deviation Borderline repolarization abnormality Prolonged QT interval QTC more prolonged than prior; otherwise no sig change Confirmed by Jacalyn Lefevre [id number]) on 06/19/2021 8:00:27 AM ? ?Radiology ?DG Chest 2 View ? ?Result Date: 06/19/2021 ?CLINICAL DATA:  61 year old male with chest pain radiating to the left jaw for 2 days. Tinnitus. EXAM: CHEST - 2 VIEW COMPARISON:   Chest radiographs 06/15/2021 and earlier. FINDINGS: Chronic pulmonary hyperinflation with flattening of the diaphragm. Stable lung volumes since 2018. Normal cardiac size and mediastinal contours. Visualized tracheal air column is within normal limits. Both lungs appear clear. No pneumothorax or pleural effusion. No acute osseous abnormality identified. Paucity of bowel gas in the upper abdomen. IMPRESSION: Chronic pulmonary hyperinflation suspicious for emphysema. No acute cardiopulmonary abnormality. Electronically Signed   By: Rexene Edison  Margo AyeHall M.D.   On: 06/19/2021 06:48   ? ?Procedures ?Procedures  ? ? ?Medications Ordered in ED ?Medications  ?carvedilol (COREG) tablet 6.25 mg (6.25 mg Oral Given 06/19/21 0946)  ?losartan (COZAAR) tablet 25 mg (25 mg Oral Given 06/19/21 0946)  ?acetaminophen (TYLENOL) tablet 650 mg (has no administration in time range)  ?  ?Differential diagnosis considered: The patient's chest pain is not suggestive of pulmonary embolus, cardiac ischemia, aortic dissection, pericarditis, myocarditis, pulmonary embolism, pneumothorax, pneumonia, Zoster, or esophageal perforation, or other serious etiology.  . EKG nonspecific for ischemia/infarction. No dysrhythmias, brugada, WPW, prolonged QT noted.  ?  ?CXR reviewed and WNL. Trop neg x2 ?  ?Given the extremely low risk of these diagnoses further testing and evaluation for these possibilities does not appear to be indicated at this time. Patient in no distress and overall condition improved here in the ED. Detailed discussions were had with the patient regarding current findings, and need for close f/u with PCP or on call doctor. The patient has been instructed to return immediately if the symptoms worsen in any way for re-evaluation. Patient verbalized understanding and is in agreement with current care plan. All questions answered prior to discharge. ? ?ED Course/ Medical Decision Making/ A&P ?  ?                        ?Medical Decision Making ?Pt  reports some chest discomfort this am.  Pt worried because his blood pressure was up and wanted to make sure his heart is okay ? ?Amount and/or Complexity of Data Reviewed ?External Data Reviewed: notes. ?

## 2021-06-19 NOTE — Discharge Instructions (Addendum)
Take all of your medications as directed.  Return if any problems.  ?

## 2021-06-22 ENCOUNTER — Encounter (HOSPITAL_COMMUNITY): Payer: Self-pay

## 2021-06-22 ENCOUNTER — Emergency Department (HOSPITAL_COMMUNITY)
Admission: EM | Admit: 2021-06-22 | Discharge: 2021-06-22 | Disposition: A | Discharge status: Home / Self Care | Payer: Medicaid Other | Attending: Emergency Medicine | Admitting: Emergency Medicine

## 2021-06-22 ENCOUNTER — Other Ambulatory Visit: Payer: Self-pay

## 2021-06-22 ENCOUNTER — Other Ambulatory Visit: Payer: Self-pay | Admitting: Nurse Practitioner

## 2021-06-22 ENCOUNTER — Emergency Department (HOSPITAL_COMMUNITY): Payer: Medicaid Other

## 2021-06-22 DIAGNOSIS — Z7902 Long term (current) use of antithrombotics/antiplatelets: Secondary | ICD-10-CM | POA: Insufficient documentation

## 2021-06-22 DIAGNOSIS — I10 Essential (primary) hypertension: Secondary | ICD-10-CM | POA: Insufficient documentation

## 2021-06-22 DIAGNOSIS — Z79899 Other long term (current) drug therapy: Secondary | ICD-10-CM | POA: Insufficient documentation

## 2021-06-22 DIAGNOSIS — R030 Elevated blood-pressure reading, without diagnosis of hypertension: Secondary | ICD-10-CM | POA: Diagnosis present

## 2021-06-22 LAB — CBC
HCT: 36.5 % — ABNORMAL LOW (ref 39.0–52.0)
Hemoglobin: 11.8 g/dL — ABNORMAL LOW (ref 13.0–17.0)
MCH: 22.3 pg — ABNORMAL LOW (ref 26.0–34.0)
MCHC: 32.3 g/dL (ref 30.0–36.0)
MCV: 69.1 fL — ABNORMAL LOW (ref 80.0–100.0)
Platelets: 356 10*3/uL (ref 150–400)
RBC: 5.28 MIL/uL (ref 4.22–5.81)
RDW: 19.5 % — ABNORMAL HIGH (ref 11.5–15.5)
WBC: 4.7 10*3/uL (ref 4.0–10.5)
nRBC: 0 % (ref 0.0–0.2)

## 2021-06-22 LAB — BASIC METABOLIC PANEL
Anion gap: 5 (ref 5–15)
BUN: 9 mg/dL (ref 8–23)
CO2: 24 mmol/L (ref 22–32)
Calcium: 9 mg/dL (ref 8.9–10.3)
Chloride: 110 mmol/L (ref 98–111)
Creatinine, Ser: 0.9 mg/dL (ref 0.61–1.24)
GFR, Estimated: >60 mL/min (ref >60)
Glucose, Bld: 110 mg/dL — ABNORMAL HIGH (ref 70–99)
Potassium: 3.5 mmol/L (ref 3.5–5.1)
Sodium: 139 mmol/L (ref 135–145)

## 2021-06-22 LAB — TROPONIN I (HIGH SENSITIVITY)
Troponin I (High Sensitivity): 7 ng/L (ref <18)
Troponin I (High Sensitivity): 6 ng/L (ref <18)

## 2021-06-22 MED ORDER — HYDRALAZINE HCL 25 MG PO TABS
25.0000 mg | ORAL_TABLET | Freq: Once | ORAL | Status: AC
  Administered 2021-06-22: 25 mg via ORAL
  Filled 2021-06-22: qty 1

## 2021-06-22 MED ORDER — NORTRIPTYLINE HCL 50 MG PO CAPS
100.0000 mg | ORAL_CAPSULE | Freq: Every day | ORAL | 2 refills | Status: DC
  Filled 2021-06-22: qty 30, 15d supply, fill #0

## 2021-06-22 NOTE — ED Provider Notes (Signed)
?[name] ?Provider Note ? ? ?CSN: [id number] ?Arrival date & time: 06/22/21  [id number] ? ?  ? ?History ? ?Chief Complaint  ?Patient presents with  ? Hypertension  ? ? ?Benjamin CorneaDavid L [name] is a 61 y.o. male. ? ?61 year old male with a past medical history of hypertension, left AKA presents to the ED with a chief complaint of elevated blood pressure, chest tightness, ringing in his ears for several months.  Reports his problems began around June 2022, he has been measuring his blood pressure daily, reports getting reads with a systolic in the 140s, diastolic in the upper 100s.  Does report his baseline blood pressures were 115/90.  He has been taking his blood pressure medication daily, however he feels as though he should have the dose increased.  He has not seen his PCP about this concern.  He is endorsing some chest tightness when he arrived here earlier today, reports that this has now improved.  He is also endorsing some shortness of breath, also reports feeling his "DominicaFars heart rate "whenever he lays down in bed, this is new.  He denies any fever, nausea, headache, vomiting. ? ?The history is provided by the patient and medical records.  ? ?  ? ?Home Medications ?Prior to Admission medications   ?Medication Sig Start Date End Date Taking? Authorizing Provider  ?acetaminophen (TYLENOL) 500 MG tablet Take 500-1,000 mg by mouth every 8 (eight) hours as needed for mild pain (or tinnitus).   Yes [provider]  ?albuterol (VENTOLIN HFA) 108 (90 Base) MCG/ACT inhaler Inhale 1-2 puffs into the lungs every 6 (six) hours as needed for wheezing or shortness of breath. 06/12/21  Yes Tanda RockersGray, Samuel A, DO  ?atorvastatin (LIPITOR) 20 MG tablet Take 1 tablet (20 mg total) by mouth daily. ?Patient taking differently: Take 20 mg by mouth at bedtime. 03/23/21  Yes Raulkar, Drema PryKrutika P, MD  ?carvedilol (COREG) 6.25 MG tablet Take 1 tablet (6.25 mg total) by mouth 2 (two) times daily with a meal.  06/01/21 07/31/21 Yes Trifan, Kermit BaloMatthew J, MD  ?acetaminophen (TYLENOL) 325 MG tablet Take 1-2 tablets (325-650 mg total) by mouth every 4 (four) hours as needed for mild pain. ?Patient not taking: Reported on 06/22/2021 12/25/20   Jacquelynn CreeLove, Pamela S, PA-C  ?clopidogrel (PLAVIX) 75 MG tablet Take 1 tablet (75 mg total) by mouth daily. 05/11/21     ?docusate sodium (COLACE) 100 MG capsule Take 1 capsule (100 mg total) by mouth daily. 03/16/21   Raulkar, Drema PryKrutika P, MD  ?gabapentin (NEURONTIN) 600 MG tablet Take 1 tablet (600 mg total) by mouth 2 (two) times daily. 03/22/21   Raulkar, Drema PryKrutika P, MD  ?guaiFENesin-dextromethorphan (ROBITUSSIN DM) 100-10 MG/5ML syrup Take 15 mLs by mouth every 4 (four) hours as needed for cough. 12/15/20   Lorin Glassahal, Binaya, MD  ?hydrOXYzine (ATARAX) 25 MG tablet Take 1 tablet (25 mg total) by mouth at bedtime for anxiety 06/01/21   Arnette FeltsMoore, Janece, FNP  ?lactose free nutrition (BOOST) LIQD Take 237 mLs by mouth 2 (two) times daily between meals.    [provider]  ?losartan (COZAAR) 25 MG tablet Take 1 tablet (25 mg total) by mouth daily. 03/23/21   Raulkar, Drema PryKrutika P, MD  ?losartan (COZAAR) 25 MG tablet Take 1 tablet (25 mg total) by mouth daily. 06/01/21 07/31/21  Terald Sleeperrifan, Matthew J, MD  ?methocarbamol (ROBAXIN) 500 MG tablet Take 1 tablet (500 mg total) by mouth every 6 (six) hours as needed for muscle spasms. 12/25/20  Jacquelynn Cree, PA-C  ?Multiple Vitamin (MULTIVITAMIN WITH MINERALS) TABS tablet Take 1 tablet by mouth daily. 12/16/20   Lorin Glass, MD  ?nortriptyline (PAMELOR) 50 MG capsule Take 2 capsules (100 mg total) by mouth at bedtime. 06/15/21   Jacalyn Lefevre, MD  ?senna (SENOKOT) 8.6 MG TABS tablet Take 1 tablet (8.6 mg total) by mouth 2 (two) times daily. 12/25/20   Jacquelynn Cree, PA-C  ?   ? ?Allergies    ?Gabapentin, Tetanus toxoids, Aspirin, and Garlic   ? ?Review of Systems   ?Review of Systems  ?Constitutional:  Negative for chills and fever.  ?Respiratory:  Negative for  shortness of breath.   ?Cardiovascular:  Positive for chest pain and palpitations.  ?Gastrointestinal:  Negative for abdominal pain, nausea and vomiting.  ?Genitourinary:  Negative for flank pain.  ?Musculoskeletal:  Negative for back pain.  ?Skin:  Negative for pallor and wound.  ?Neurological:  Negative for light-headedness and headaches.  ?All other systems reviewed and are negative. ? ?Physical Exam ?Updated Vital Signs ?BP 131/87   Pulse (!) 59   Temp 97.7 ?F (36.5 ?C) (Oral)   Resp 16   Ht 5\' 8"  (1.727 m)   Wt 69 kg   SpO2 97%   BMI 23.13 kg/m?  ?Physical Exam ?Vitals and nursing note reviewed.  ?Constitutional:   ?   Appearance: He is well-developed.  ?HENT:  ?   Head: Normocephalic and atraumatic.  ?Eyes:  ?   General: No scleral icterus. ?   Pupils: Pupils are equal, round, and reactive to light.  ?Cardiovascular:  ?   Heart sounds: Normal heart sounds.  ?Pulmonary:  ?   Effort: Pulmonary effort is normal.  ?   Breath sounds: Normal breath sounds. No wheezing.  ?Chest:  ?   Chest wall: No tenderness.  ?Abdominal:  ?   General: Bowel sounds are normal. There is no distension.  ?   Palpations: Abdomen is soft.  ?   Tenderness: There is no abdominal tenderness.  ?Musculoskeletal:     ?   General: No tenderness or deformity.  ?   Cervical back: Normal range of motion.  ?   Left Lower Extremity: Left leg is amputated above knee.  ?Skin: ?   General: Skin is warm and dry.  ?Neurological:  ?   Mental Status: He is alert and oriented to person, place, and time.  ? ? ?ED Results / Procedures / Treatments   ?Labs ?(all labs ordered are listed, but only abnormal results are displayed) ?Labs Reviewed  ?BASIC METABOLIC PANEL - Abnormal; Notable for the following components:  ?    Result Value  ? Glucose, Bld 110 (*)   ? All other components within normal limits  ?CBC - Abnormal; Notable for the following components:  ? Hemoglobin 11.8 (*)   ? HCT 36.5 (*)   ? MCV 69.1 (*)   ? MCH 22.3 (*)   ? RDW 19.5 (*)   ? All  other components within normal limits  ?TROPONIN I (HIGH SENSITIVITY)  ?TROPONIN I (HIGH SENSITIVITY)  ? ? ?EKG ?EKG Interpretation ? ?Date/Time:  Tuesday Jun 22 2021 09:46:00 EDT ?Ventricular Rate:  65 ?PR Interval:  142 ?QRS Duration: 76 ?QT Interval:  428 ?QTC Calculation: 445 ?R Axis:   -14 ?Text Interpretation: Normal sinus rhythm ST & T wave abnormality, consider inferolateral ischemia  ST/T changes similar to June 19 2021 Confirmed by [id number] [phone]) on 06/22/2021 1:31:36 PM ? ?Radiology ?DG Chest  2 View ? ?Result Date: 06/22/2021 ?CLINICAL DATA:  Chest pain for several days.  Hypertension. EXAM: CHEST - 2 VIEW COMPARISON:  06/19/2021 FINDINGS: The heart size and mediastinal contours are within normal limits. Both lungs are clear. Mild pulmonary hyperinflation again seen, suspicious for COPD. 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 No active disease. Electronically Signed   By: Danae Orleans M.D.   On: 06/22/2021 10:01   ? ?Procedures ?Procedures  ? ? ?Medications Ordered in ED ?Medications  ?hydrALAZINE (APRESOLINE) tablet 25 mg (25 mg Oral Given 06/22/21 0947)  ? ? ?ED Course/ Medical Decision Making/ A&P ?  ?                        ?Medical Decision Making ? ?This patient presents to the ED for concern of ELEVATED BP, this involves a number of treatment options, and is a complaint that carries with it a high risk of complications and morbidity.  The differential diagnosis includes ACS, hypertensive urgency versus hypertensive emergency. ? ? ?Co morbidities: ?Discussed in HPI ? ? ?Brief History: ? ?Patient with underlying history of hypertension currently on medication, left AKA presents to the ED with a chief complaint of elevated blood pressure.  At the time of my evaluation patient is wearing a blood pressure cuff on his left wrist, reports he has been taking his blood pressure throughout the day and continues to be elevated with a systolic in the 150s, he is currently taking medication however reports that he  feels that he likely needs an increase in his dose but has not seen his PCP recently.  He is without any chest pain, no shortness of breath, no headaches.  Does report ringing in his ears, but this has a recurrent compla

## 2021-06-22 NOTE — ED Provider Triage Note (Signed)
Emergency Medicine Provider Triage Evaluation Note ? ?Benjamin [name] , a 61 y.o. male  was evaluated in triage.  Pt complains of elevated blood pressure.  States that he has been having trouble with his blood pressure and begins to have a "fast heart rate" when he lays down.  Endorses chest discomfort when this happens.  Also says he is concerned about tinnitus.  ? ?Review of Systems  ?Positive: As above ?Negative: Visual disturbance and eye pain ? ?Physical Exam  ?BP (!) 186/105 (BP Location: Left Arm)   Pulse 68   Temp 97.7 ?F (36.5 ?C) (Oral)   Resp 16   SpO2 96%  ?Gen:   Awake, no distress   ?Resp:  Normal effort  ?MSK:   Moves extremities without difficulty  ?Other:  RRR, lung sounds clear ? ?Medical Decision Making  ?Medically screening exam initiated at 9:43 AM.  Appropriate orders placed.  Benjamin [name] was informed that the remainder of the evaluation will be completed by another provider, this initial triage assessment does not replace that evaluation, and the importance of remaining in the ED until their evaluation is complete. ? ? ?Patient has been evaluated for this 3-4 times last month.  He was referred to ENT and told to follow-up with his PCP, he has not done either of these.  Hydralazine given in triage due to elevated blood pressure ?  ?Saddie Benders, PA-C ?06/22/21 0945 ? ?

## 2021-06-22 NOTE — ED Notes (Signed)
Pt denies chest pain or associated symptoms. He does endorse headache unrelieved by tylenol. He reports taking same BP meds, same time of day, for the past 3 months without difficulties. He reports that he has started working out at the gym this month, was at the gym yesterday and felt fine. He noticed headache and ringing in his ears worse today.  ?

## 2021-06-22 NOTE — Discharge Instructions (Addendum)
Your laboratory results are within normal limits today. ? ?Please follow-up with your primary care physician in order to have your blood pressure medication further changed if needed. ? ?You experience any chest pain, shortness of breath, headaches you will need to return to the emergency department. ?

## 2021-06-23 ENCOUNTER — Ambulatory Visit: Payer: Medicaid Other | Admitting: Occupational Therapy

## 2021-06-23 ENCOUNTER — Other Ambulatory Visit: Payer: Self-pay

## 2021-06-23 ENCOUNTER — Ambulatory Visit: Payer: Medicaid Other | Attending: Nurse Practitioner

## 2021-06-23 ENCOUNTER — Encounter: Payer: Self-pay | Admitting: Occupational Therapy

## 2021-06-23 DIAGNOSIS — R2681 Unsteadiness on feet: Secondary | ICD-10-CM

## 2021-06-23 DIAGNOSIS — R2689 Other abnormalities of gait and mobility: Secondary | ICD-10-CM | POA: Diagnosis present

## 2021-06-23 DIAGNOSIS — M6281 Muscle weakness (generalized): Secondary | ICD-10-CM

## 2021-06-23 DIAGNOSIS — Z89612 Acquired absence of left leg above knee: Secondary | ICD-10-CM | POA: Diagnosis present

## 2021-06-23 DIAGNOSIS — R293 Abnormal posture: Secondary | ICD-10-CM | POA: Insufficient documentation

## 2021-06-23 MED ORDER — ATORVASTATIN CALCIUM 20 MG PO TABS
ORAL_TABLET | ORAL | 1 refills | Status: DC
  Filled 2021-06-23: qty 30, 30d supply, fill #0

## 2021-06-23 NOTE — Therapy (Signed)
?OUTPATIENT PHYSICAL THERAPY PROSTHETIC TREATMENT NOTE ? ? ?Patient Name: Benjamin [name] ?MRN: [id number] ?DOB:December 01, 1960, 61 y.o., male ?Today's Date: 06/23/2021 ? ?PCP: [name], FNP ?REFERRING PROVIDER: [name], FNP ? ? PT End of Session - 06/23/21 1115   ? ? Visit Number 3   ? Number of Visits 16   ? Date for PT Re-Evaluation 08/03/21   ? Authorization Type [employer] (15 visits)   ? Progress Note Due on Visit 16   ? PT Start Time 1105   ? PT Stop Time 1150   ? PT Time Calculation (min) 45 min   ? Equipment Utilized During Treatment Gait belt   ? Activity Tolerance Patient tolerated treatment well   ? Behavior During Therapy [facility] for tasks assessed/performed   ? ?  ?  ? ?  ? ? ?Past Medical History:  ?Diagnosis Date  ? Back pain   ? Heavy smoker   ? Hyperlipidemia LDL goal <70   ? Hypertension   ? Peripheral arterial occlusive disease ([place]) 06/2013  ? Bilateral femoral artery disease  ? ?Past Surgical History:  ?Procedure Laterality Date  ? ABDOMINAL AORTOGRAM W/LOWER EXTREMITY N/A 07/12/2016  ? Procedure: Abdominal Aortogram w/Lower Extremity;  Surgeon: Serafina Mitchell, MD;  Location: [place] [facility];  Service: Cardiovascular;  Laterality: N/A;  Bilateral extermity: Patent Renal As. No sig Dz in infrarenal Abd Aorta. Normal Bilat Iliac arteries. R SFA is 100% @ origin - recon in AK-Pop A. R PT A patent. L CFA occluded. L PFA recon @ origin, L SFA occluded w/ recon in AK Pop A.  ? ABDOMINAL AORTOGRAM W/LOWER EXTREMITY N/A 10/15/2019  ? Procedure: ABDOMINAL AORTOGRAM W/LOWER EXTREMITY;  Surgeon: Serafina Mitchell, MD;  Location: [place] [facility];  Service: Cardiovascular;  Laterality: N/A;  ? AMPUTATION Left 10/04/2020  ? Procedure: LEFT ABOVE KNEE AMPUTATION;  Surgeon: Serafina Mitchell, MD;  Location: [place];  Service: Vascular;  Laterality: Left;  ? AMPUTATION Left 12/08/2020  ? Procedure: RIGHT ABOVE KNEE AMPUTATION REVISION;  Surgeon: Angelia Mould, MD;  Location: [facility] OR;  Service:  Vascular;  Laterality: Left;  ? AORTA - BILATERAL FEMORAL ARTERY BYPASS GRAFT N/A 05/08/2020  ? Procedure: AORTA BIFEMORAL BYPASS GRAFT;  Surgeon: Serafina Mitchell, MD;  Location: [facility] OR;  Service: Vascular;  Laterality: N/A;  ? COLONOSCOPY  06/2016  ? never  ? ENDARTERECTOMY FEMORAL Left 11/14/2019  ? Procedure: Left groin exploration, Redo left femoral artery exposure;  Surgeon: Serafina Mitchell, MD;  Location: [place];  Service: Vascular;  Laterality: Left;  ? FEMORAL-FEMORAL BYPASS GRAFT N/A 11/13/2019  ? Procedure: BYPASS GRAFT FEMORAL-FEMORAL ARTERY RIGHT TO LEFT USING HEMASHIELD GOLD GRAFT 76mm x 30cm;  Surgeon: Serafina Mitchell, MD;  Location: [facility] OR;  Service: Vascular;  Laterality: N/A;  ? FEMORAL-POPLITEAL BYPASS GRAFT Left 11/13/2019  ? Procedure: LEFT FEMORAL BELOW KNEE-POPLITEAL ARTERY USING NON-REVERSED GREATER SAPHENOUS VEIN;  Surgeon: Serafina Mitchell, MD;  [facility];  Service: Vascular;  Laterality: Left;  ? FEMORAL-POPLITEAL BYPASS GRAFT Left 05/08/2020  ? Procedure: LEFT REDO BYPASS GRAFT COMMON FEMORAL- BELOW KNEE POPLITEAL ARTERY USING PROPATEN GRAFT;  Surgeon: Serafina Mitchell, MD;  [facility]: [facility];  Service: Vascular;  Laterality: Left;  ? I & D EXTREMITY Left 09/07/2020  ? Procedure: IRRIGATION AND DEBRIDEMENT LEFT LEG WITH EXCISION OF LEFT LEG DISTAL BYPASS GRAFT;  Surgeon: Marty Heck, MD;  Location: [place];  Service: Vascular;  Laterality: Left;  ?  LOWER EXTREMITY ANGIOGRAM Left 11/14/2019  ? Procedure: LEFT LOWER EXTREMITY ANGIOGRAM, BYPASS GRAFT ANGIOPLASTY;  Surgeon: Nada LibmanBrabham, Vance W, MD;  [facility];  Service: Vascular;  Laterality: Left;  ? PERIPHERAL VASCULAR INTERVENTION  10/15/2019  ? Procedure: PERIPHERAL VASCULAR INTERVENTION;  Surgeon: Nada LibmanBrabham, Vance W, MD;  [facility];  Service: Cardiovascular;;  Rt Iliac  ? REMOVAL OF GRAFT Left 10/04/2020  ? Procedure: REMOVAL OF LEFT FEMORAL TO POPITEAL BYPASS GRAFT;  Surgeon: Nada LibmanBrabham, Vance W, MD;  [facility] OR;  Service: Vascular;  Laterality: Left;  ? ?Patient Active Problem List  ? Diagnosis Date Noted  ? Acute blood loss anemia 12/25/2020  ? Hyponatremia 12/25/2020  ? 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 Protein-calorie malnutrition, severe 12/09/2020  ? Wound cellulitis 12/07/2020  ? 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 Ischemia of lower extremity 10/01/2020  ? 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 PAD (peripheral artery disease) (HCC) 11/13/2019  ? Callus of foot 07/01/2019  ? Intermittent claudication (HCC) 01/20/2017  ? Need for influenza vaccination 01/20/2017  ? Needs smoking cessation education 09/06/2016  ? Abnormal echocardiogram 09/05/2016  ? Abnormal EKG 07/25/2016  ? Preoperative cardiovascular examination 07/25/2016  ? Shortness of breath on exertion 07/25/2016  ? Dyslipidemia, goal LDL below 70 07/25/2016  ? High blood pressure 07/25/2016  ? Abnormal chest x-ray 07/04/2016  ? Peripheral vascular disease (HCC) 07/04/2016  ? Decreased pedal pulses 06/24/2016  ? 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 Heavy smoker 06/24/2016  ? Abnormal weight loss 06/24/2016  ? Foot pain, left 06/24/2016  ? Screening for prostate cancer 06/24/2016  ? ? ?ONSET DATE: 12/08/2020 ?  ?REFERRING DIAG: [id number] (ICD-10-CM) - Unilateral AKA, left (HCC)  ?  ?THERAPY DIAG:  ?Other abnormalities of gait and mobility - Plan: PT plan of care cert/re-cert ?  ?Unsteadiness on feet - Plan: PT plan of care cert/re-cert ?  ?Hx of AKA (above knee amputation), left (HCC) - Plan: PT plan of care cert/re-cert ?  ?SUBJECTIVE:  ?  ?SUBJECTIVE STATEMENT: ?Pt reports prosthetics was adjusted (socket was cut medially) to prevent pinching. Pt reports he goes to [employer] every day of the week with prosthesis. ?Pt accompanied by: self ?  ?PERTINENT HISTORY: PMH significant for HTN, HLD, heavy smoking, peripheral  artery disease s/p bilateral femoral artery bypass graft  ? ? ?PAIN:  ?Are you having pain? No (not pain just ringing in L ear rates it at 9/10) Has ENT appt in June.  ? ? ?    ? ? PATIENT EDUCATION: ?Education details: Skin check, Correct ply sock adjustment, Propper donning, Propper doffing, Proper wear schedule/adjustment, and Proper weight-bearing schedule/adjustment ?Person educated: Patient ?Education method: Explanation, Demonstration, and Verbal cues ?Education comprehension: verbalized understanding, returned demonstration, verbal cues required, and needs further education ? ?TODAY'S TREATMENT:  ?  ?TREATMENT:  ?BP: 132 /76 ?Pt forgot his shoe for prosthetic leg so we used the one from the clinic. ?Pt educated on incrasing wear time to 3hrd, 3x/day ?Pr educated on not doing leg press with the machine where he has to lay on floor but use machine where he can sit. Pt can continue to use UE and LE bike at gym. ?Pt reports that he has standard walker and rolling 2 wheel walker which is broken so he is only using standard walker right now. Pt educated on taking the wheels  and tennis ball legs off the rolling walker and replacing them on standard walker so he can use standard walker for roling. Pt pt is unable to do this home, he can bring his walkers in the car and we can try to change them for him. Pt verbalized understanding. ?Gait training: 1 x 230' with RW and min A, PT pushed the walker to make sure he keep walker rolling forward to improve rolling forward motion during L stance phase and promote longer R step length. Pt has heavy use of UE on walker which prevents him from rolling walker in continuous motion. Pt was given cues for smaller L stride and longer R stride to make sure R foot passes left to improve forward translation on prosthetic foot to improve late stance knee flexion. Pt had 3 instances where L leg buckled as patient didn't platn heel of the prosthetic leg and L knee was in flexion. ? ?Pt  was asked to doff his prosthetic leg at end of the session. Pt was wheeled back in wheelchair to his car to conserve energy at end of the session.  ?Pt educated to bring his shoe next time and he shoul

## 2021-06-23 NOTE — Therapy (Addendum)
?OUTPATIENT OCCUPATIONAL THERAPY TREATMENT NOTE ? ? ?Patient Name: Benjamin [name] ?MRN: [id number] ?DOB:01/18/1961, 61 y.o., male ?Today's Date: 06/23/2021 ? ?PCP: Arnette FeltsMoore, Janece, FNP ?REFERRING PROVIDER: Horton Chinaulkar, Krutika P, MD  ? ?END OF SESSION:  ? OT End of Session - 06/23/21 1022   ? ? Visit Number 3   ? Number of Visits 17   ? Date for OT Re-Evaluation 08/03/21   ? Authorization Type [employer] - awaiting approval   ? OT Start Time 1020   ? OT Stop Time 1105   ? OT Time Calculation (min) 45 min   ? Activity Tolerance Patient tolerated treatment well   ? Behavior During Therapy [facility] for tasks assessed/performed   ? ?  ?  ? ?  ? ? ?Past Medical History:  ?Diagnosis Date  ? Back pain   ? Heavy smoker   ? Hyperlipidemia LDL goal <70   ? Hypertension   ? 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  ? Bilateral femoral artery disease  ? ?Past Surgical History:  ?Procedure Laterality Date  ? ABDOMINAL AORTOGRAM W/LOWER EXTREMITY N/A 07/12/2016  ? Procedure: Abdominal Aortogram w/Lower Extremity;  Surgeon: Nada LibmanBrabham, Vance W, MD;  [facility];  Service: Cardiovascular;  Laterality: N/A;  Bilateral extermity: Patent Renal As. No sig Dz in infrarenal Abd Aorta. Normal Bilat Iliac arteries. R SFA is 100% @ origin - recon in AK-Pop A. R PT A patent. L CFA occluded. L PFA recon @ origin, L SFA occluded w/ recon in AK Pop A.  ? ABDOMINAL AORTOGRAM W/LOWER EXTREMITY N/A 10/15/2019  ? Procedure: ABDOMINAL AORTOGRAM W/LOWER EXTREMITY;  Surgeon: Nada LibmanBrabham, Vance W, MD;  [facility];  Service: Cardiovascular;  Laterality: N/A;  ? AMPUTATION Left 10/04/2020  ? Procedure: LEFT ABOVE KNEE AMPUTATION;  Surgeon: Nada LibmanBrabham, Vance W, MD;  [facility];  Service: Vascular;  Laterality: Left;  ? AMPUTATION Left 12/08/2020  ? Procedure: RIGHT ABOVE KNEE AMPUTATION REVISION;  Surgeon: Chuck Hintickson, Christopher S, MD;  [facility];  Service: Vascular;  Laterality: Left;  ? AORTA - BILATERAL FEMORAL  ARTERY BYPASS GRAFT N/A 05/08/2020  ? Procedure: AORTA BIFEMORAL BYPASS GRAFT;  Surgeon: Nada LibmanBrabham, Vance W, MD;  [facility];  Service: Vascular;  Laterality: N/A;  ? COLONOSCOPY  06/2016  ? never  ? ENDARTERECTOMY FEMORAL Left 11/14/2019  ? Procedure: Left groin exploration, Redo left femoral artery exposure;  Surgeon: Nada LibmanBrabham, Vance W, MD;  [facility];  Service: Vascular;  Laterality: Left;  ? FEMORAL-FEMORAL BYPASS GRAFT N/A 11/13/2019  ? Procedure: BYPASS GRAFT FEMORAL-FEMORAL ARTERY RIGHT TO LEFT USING HEMASHIELD GOLD GRAFT 8mm x 30cm;  Surgeon: Nada LibmanBrabham, Vance W, MD;  [facility];  Service: Vascular;  Laterality: N/A;  ? FEMORAL-POPLITEAL BYPASS GRAFT Left 11/13/2019  ? Procedure: LEFT FEMORAL BELOW KNEE-POPLITEAL ARTERY USING NON-REVERSED GREATER SAPHENOUS VEIN;  Surgeon: Nada LibmanBrabham, Vance W, MD;  [facility];  Service: Vascular;  Laterality: Left;  ? FEMORAL-POPLITEAL BYPASS GRAFT Left 05/08/2020  ? Procedure: LEFT REDO BYPASS GRAFT COMMON FEMORAL- BELOW KNEE POPLITEAL ARTERY USING PROPATEN GRAFT;  Surgeon: Nada LibmanBrabham, Vance W, MD;  [facility];  Service: Vascular;  Laterality: Left;  ? I & D EXTREMITY Left 09/07/2020  ? Procedure: IRRIGATION AND DEBRIDEMENT LEFT LEG WITH EXCISION OF LEFT LEG DISTAL BYPASS GRAFT;  Surgeon: Cephus Shellinglark, Christopher J, MD;  [facility];  Service: Vascular;  Laterality: Left;  ? LOWER EXTREMITY ANGIOGRAM Left 11/14/2019  ? Procedure: LEFT LOWER EXTREMITY  ANGIOGRAM, BYPASS GRAFT ANGIOPLASTY;  Surgeon: Serafina Mitchell, MD;  Location: [place];  Service: Vascular;  Laterality: Left;  ? PERIPHERAL VASCULAR INTERVENTION  10/15/2019  ? Procedure: PERIPHERAL VASCULAR INTERVENTION;  Surgeon: Serafina Mitchell, MD;  Location: [place] [facility];  Service: Cardiovascular;;  Rt Iliac  ? REMOVAL OF GRAFT Left 10/04/2020  ? Procedure: REMOVAL OF LEFT FEMORAL TO POPITEAL BYPASS GRAFT;  Surgeon: Serafina Mitchell, MD;  Location: [place];  Service: Vascular;  Laterality: Left;  ? ?Patient  Active Problem List  ? Diagnosis Date Noted  ? Acute blood loss anemia 12/25/2020  ? Hyponatremia 12/25/2020  ? 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[place]) 12/25/2020  ? Unilateral AKA, left ([place]) 12/15/2020  ? Protein-calorie malnutrition, severe 12/09/2020  ? Wound cellulitis 12/07/2020  ? 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 Ischemia of lower extremity 10/01/2020  ? 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[place]) 05/08/2020  ? Femoral-popliteal bypass graft occlusion, left ([place]) 11/13/2019  ? PAD (peripheral artery disease) ([place]) 11/13/2019  ? Callus of foot 07/01/2019  ? Intermittent claudication ([place]) 01/20/2017  ? Need for influenza vaccination 01/20/2017  ? Needs smoking cessation education 09/06/2016  ? Abnormal echocardiogram 09/05/2016  ? Abnormal EKG 07/25/2016  ? Preoperative cardiovascular examination 07/25/2016  ? Shortness of breath on exertion 07/25/2016  ? Dyslipidemia, goal LDL below 70 07/25/2016  ? High blood pressure 07/25/2016  ? Abnormal chest x-ray 07/04/2016  ? Peripheral vascular disease ([place]) 07/04/2016  ? Decreased pedal pulses 06/24/2016  ? 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 Heavy smoker 06/24/2016  ? Abnormal weight loss 06/24/2016  ? Foot pain, left 06/24/2016  ? Screening for prostate cancer 06/24/2016  ? ? ?ONSET DATE: 12/08/20 (Lt AKA revision) ? ?REFERRING [id number] (ICD-10-CM) - Unilateral AKA, left (HCC)  ? ?THERAPY DIAG:  ?Unsteadiness on feet ? ?Muscle weakness (generalized) ? ? ?PERTINENT HISTORY: necrotic left above-knee amputation site. 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 PMH includes PAD, HLD, HTN, abnormal glucose.  ? ?PRECAUTIONS: **HTN, fall risk, Lt AKA w/ prosthestic ? ?SUBJECTIVE: I got the prosthesis cut down more yesterday at [place]. My blood pressure was a little high. I've practiced putting on my prosthesis and pants 3 times since I last saw you ? ?PAIN:  ?Are you having pain? No but reports mild pressure in chest.  ? ? ? ? ?OBJECTIVE:  ? ?TODAY'S TREATMENT: ? ? BP: 154/96, HR =  84 ? ?Issued strengthening HEP for BUE's (red theraband) - see pt instructions for details. Pt performed each x 10 reps w/ mod cueing to prevent shoulder hiking (Rt > Lt). Noted weakness posterior sh girdle and posterior deltoids.  ? ?Pt issued referral for tub bench and instructed to have MD sign/date at next visit and then take referral to medical supply store for insurance to hopefully cover this DME.  ? ?Practiced donning prosthesis, pants, and shoes for review and prior to P.T. appointment. Pt able to do w/ min cueing for efficiency and safety (close supervision in standing for clothes management) ? ?PATIENT EDUCATION: ?Education details: BUE strengthening HEP ?Person educated: Patient ?Education method: Explanation, Demonstration, Verbal cues, and Handouts ?Education comprehension: verbalized understanding, returned demonstration, verbal cues required, and needs further education  ? ? ?[place]: ?06/16/21:  sequencing for LE dressing w/ prosthesis, tub transfer w/ bench, and A/E and DME recommendations ? 06/23/21: BUE strengthening HEP for posterior sh girdle ? ?GOALS: ?Goals reviewed with patient? Yes ?  ?SHORT TERM GOALS: Target date: 07/06/2021 ?  ?Independent  with UB strengthening HEP  ?Baseline: DEPENDENT ?Goal status: ONGOING ?  ?2.  Pt will verbalize understanding of DME and A/E to increase safety and independence with ADLS ?Baseline: DEPENDENT ?Goal status: ONGOING ?  ?3.  Pt will perform LE dressing w/ prosthesis on I'ly w/ AE prn ?Baseline: dependent with prosthesis ?Goal status: ONGOING ?  ?LONG TERM GOALS: Target date: 08/03/2021 ?  ?Pt to perform dynamic standing tasks/IADLS using prosthesis (washing dishes, folding clothes, light cleaning) ?Baseline: DEPENDENT ?Goal status: INITIAL ?  ?2.  Pt to wear prosthesis at least 50% of the time during the day for ADLS/IADLS ?Baseline: Not currently wearing ?Goal status: INITIAL ?  ?3. Pt will simulate tub transfer with proper  DME safety and independently ?            Baseline: unable - does not have proper DME ?            Goal status: ONGOING - practiced in clinic w/o difficulty 06/16/21 ?  ?  ?ASSESSMENT: ?  ?CLINICAL IMPRESSION: ?

## 2021-06-23 NOTE — Patient Instructions (Addendum)
?  KEEP SHOULDERS DOWN. DO NOT RAISE SHOULDERS TOWARDS EARS WITH ANY EXERCISES. SHOULDERS SHOULD ONLY GO BACK ? ?Elbow Extension: Chair Stand - Resisted ? ? ? ?With hands on armrests, push up from chair. Use legs as much as necessary. Return slowly . ?Repeat _10___ times per set. Do __1__ sets per session. Do __2__ sessions per day. ? ? ?Resisted Horizontal Abduction: Bilateral ? ? ? ?Sit or stand, tubing in both hands, arms out in front. Keeping arms straight, pinch shoulder blades together and stretch arms out. ?Repeat _10___ times per set.  Do __2__ sessions per day. ? ? ?(Clinic) Retraction: Row - Bilateral ([place]) ? ? ? ?Facing pulley, arms reaching forward, pull hands toward stomach, pinching shoulder blades together. ?Repeat __10__ times per set. Do __1__ sets per session. Do __2__ sessions per day.  ? ? ?Strengthening: Resisted Extension ? ? ?SEATED: Hold tubing in __BOTH___ hand(s), arm forward. Pull arm back, elbow straight. ?Repeat _10___ times per set. Do _1-2___ sessions per day, every other day. ? ? ? ? ? ? ? ? ?

## 2021-06-24 ENCOUNTER — Emergency Department (HOSPITAL_COMMUNITY)
Admission: EM | Admit: 2021-06-24 | Discharge: 2021-06-24 | Discharge status: Against Medical Advice | Payer: Medicaid Other | Attending: Emergency Medicine | Admitting: Emergency Medicine

## 2021-06-24 ENCOUNTER — Encounter (HOSPITAL_COMMUNITY): Payer: Self-pay | Admitting: Emergency Medicine

## 2021-06-24 ENCOUNTER — Other Ambulatory Visit: Payer: Self-pay

## 2021-06-24 DIAGNOSIS — Z89612 Acquired absence of left leg above knee: Secondary | ICD-10-CM | POA: Insufficient documentation

## 2021-06-24 DIAGNOSIS — Z79899 Other long term (current) drug therapy: Secondary | ICD-10-CM | POA: Diagnosis not present

## 2021-06-24 DIAGNOSIS — I10 Essential (primary) hypertension: Secondary | ICD-10-CM | POA: Insufficient documentation

## 2021-06-24 LAB — CBC WITH DIFFERENTIAL/PLATELET
Abs Immature Granulocytes: 0.02 10*3/uL (ref 0.00–0.07)
Basophils Absolute: 0 10*3/uL (ref 0.0–0.1)
Basophils Relative: 1 %
Eosinophils Absolute: 0.1 10*3/uL (ref 0.0–0.5)
Eosinophils Relative: 2 %
HCT: 36.1 % — ABNORMAL LOW (ref 39.0–52.0)
Hemoglobin: 11.8 g/dL — ABNORMAL LOW (ref 13.0–17.0)
Immature Granulocytes: 0 %
Lymphocytes Relative: 23 %
Lymphs Abs: 1.5 10*3/uL (ref 0.7–4.0)
MCH: 22.5 pg — ABNORMAL LOW (ref 26.0–34.0)
MCHC: 32.7 g/dL (ref 30.0–36.0)
MCV: 68.9 fL — ABNORMAL LOW (ref 80.0–100.0)
Monocytes Absolute: 0.4 10*3/uL (ref 0.1–1.0)
Monocytes Relative: 6 %
Neutro Abs: 4.5 10*3/uL (ref 1.7–7.7)
Neutrophils Relative %: 68 %
Platelets: 368 10*3/uL (ref 150–400)
RBC: 5.24 MIL/uL (ref 4.22–5.81)
RDW: 19.6 % — ABNORMAL HIGH (ref 11.5–15.5)
WBC: 6.6 10*3/uL (ref 4.0–10.5)
nRBC: 0 % (ref 0.0–0.2)

## 2021-06-24 LAB — BASIC METABOLIC PANEL
Anion gap: 9 (ref 5–15)
BUN: 11 mg/dL (ref 8–23)
CO2: 23 mmol/L (ref 22–32)
Calcium: 8.9 mg/dL (ref 8.9–10.3)
Chloride: 111 mmol/L (ref 98–111)
Creatinine, Ser: 0.89 mg/dL (ref 0.61–1.24)
GFR, Estimated: >60 mL/min (ref >60)
Glucose, Bld: 112 mg/dL — ABNORMAL HIGH (ref 70–99)
Potassium: 3.7 mmol/L (ref 3.5–5.1)
Sodium: 143 mmol/L (ref 135–145)

## 2021-06-24 LAB — TROPONIN I (HIGH SENSITIVITY)
Troponin I (High Sensitivity): 7 ng/L (ref <18)
Troponin I (High Sensitivity): 7 ng/L (ref <18)

## 2021-06-24 NOTE — ED Provider Notes (Signed)
?[name] EMERGENCY DEPARTMENT ?Provider Note ? ? ?CSN: [id number] ?Arrival date & time: 06/24/21  9833 ? ?  ? ?History ?Chief Complaint  ?Patient presents with  ? Hypertension  ? ? ?Benjamin [name] is a 61 y.o. male. ? ?Patient with history of hypertension presents with elevated blood pressure at home, he notes he has had a systolic of >170 at home but typically his pressures range around 115 systolic. He takes the clonidine patch, carvedilol 6.25 mg twice daily and losartan 25 mg daily. He endorses compliance on all his medications. He started to experience chest tightness and elevated BP after he took trazodone last night and the night prior to this, he was prescribed the trazodone about a year ago after he had undergone an AKA but had not taken this medication in months prior to restarting the night before last. Ambulates with assistance of walker and lives with sister, he is able to care for himself overall. He denies chest pain, dyspnea, vision changes, weakness, headache, nausea and vomiting. Has not seen his primary provider since these issues have started.  ? ?The history is provided by the patient. No language interpreter was used.  ?Hypertension ?Pertinent negatives include no chest pain, no headaches and no shortness of breath.  ? ?  ? ?Home Medications ?Prior to Admission medications   ?Medication Sig Start Date End Date Taking? Authorizing Provider  ?acetaminophen (TYLENOL) 325 MG tablet Take 1-2 tablets (325-650 mg total) by mouth every 4 (four) hours as needed for mild pain. ?Patient not taking: Reported on 06/22/2021 12/25/20   Jacquelynn Cree, PA-C  ?acetaminophen (TYLENOL) 500 MG tablet Take 500-1,000 mg by mouth every 8 (eight) hours as needed for mild pain (or tinnitus).    [provider]  ?albuterol (VENTOLIN HFA) 108 (90 Base) MCG/ACT inhaler Inhale 1-2 puffs into the lungs every 6 (six) hours as needed for wheezing or shortness of breath. 06/12/21   Sloan Leiter, DO   ?atorvastatin (LIPITOR) 20 MG tablet Take 1 tablet (20 mg total) by mouth daily. ?Patient taking differently: Take 20 mg by mouth at bedtime. 03/23/21   Raulkar, Drema Pry, MD  ?atorvastatin (LIPITOR) 20 MG tablet Take 1 tablet by mouth daily 06/23/21     ?carvedilol (COREG) 6.25 MG tablet Take 1 tablet (6.25 mg total) by mouth 2 (two) times daily with a meal. 06/01/21 07/31/21  Trifan, Kermit Balo, MD  ?cloNIDine (CATAPRES - DOSED IN MG/24 HR) 0.1 mg/24hr patch Place 0.1 mg onto the skin once a week.    [provider]  ?clopidogrel (PLAVIX) 75 MG tablet Take 1 tablet (75 mg total) by mouth daily. 05/11/21     ?docusate sodium (COLACE) 100 MG capsule Take 1 capsule (100 mg total) by mouth daily. ?Patient not taking: Reported on 06/22/2021 03/16/21   Horton Chin, MD  ?gabapentin (NEURONTIN) 600 MG tablet Take 1 tablet (600 mg total) by mouth 2 (two) times daily. 03/22/21   Raulkar, Drema Pry, MD  ?guaiFENesin-dextromethorphan (ROBITUSSIN DM) 100-10 MG/5ML syrup Take 15 mLs by mouth every 4 (four) hours as needed for cough. ?Patient not taking: Reported on 06/22/2021 12/15/20   Lorin Glass, MD  ?hydrOXYzine (ATARAX) 25 MG tablet Take 1 tablet (25 mg total) by mouth at bedtime for anxiety ?Patient taking differently: Take 25 mg by mouth at bedtime as needed for anxiety. 06/01/21   Arnette Felts, FNP  ?losartan (COZAAR) 25 MG tablet Take 1 tablet (25 mg total) by mouth daily. ?Patient  not taking: Reported on 06/22/2021 03/23/21   Horton Chinaulkar, Krutika P, MD  ?losartan (COZAAR) 25 MG tablet Take 1 tablet (25 mg total) by mouth daily. 06/01/21 07/31/21  Terald Sleeperrifan, Matthew J, MD  ?methocarbamol (ROBAXIN) 500 MG tablet Take 1 tablet (500 mg total) by mouth every 6 (six) hours as needed for muscle spasms. ?Patient not taking: Reported on 06/22/2021 12/25/20   Jacquelynn CreeLove, Pamela S, PA-C  ?Multiple Vitamin (MULTIVITAMIN WITH MINERALS) TABS tablet Take 1 tablet by mouth daily. ?Patient not taking: Reported on 06/22/2021 12/16/20   Lorin Glassahal, [place],  MD  ?NON FORMULARY 1-5 drops See admin instructions. Unnamed tinnitus relief ear drops- Instill 1-5 drops into the affected ear(s) two times a day as needed for tinnitus    [provider]  ?nortriptyline (PAMELOR) 50 MG capsule Take 2 capsules (100 mg total) by mouth at bedtime. 06/22/21   Arnette FeltsMoore, Janece, FNP  ?senna (SENOKOT) 8.6 MG TABS tablet Take 1 tablet (8.6 mg total) by mouth 2 (two) times daily. ?Patient not taking: Reported on 06/22/2021 12/25/20   Jacquelynn CreeLove, Pamela S, PA-C  ?Vitamins-Lipotropics (LIPO-FLAVONOID PLUS PO) Take 1 capsule by mouth 2 (two) times daily as needed (for tinnitus).    [provider]  ?   ? ?Allergies    ?Gabapentin, Tetanus toxoids, Aspirin, and Garlic   ? ?Review of Systems   ?Review of Systems  ?Constitutional:  Negative for activity change, appetite change and fever.  ?HENT:  Negative for congestion.   ?Eyes:  Negative for visual disturbance.  ?Respiratory:  Positive for chest tightness. Negative for cough, shortness of breath and wheezing.   ?Cardiovascular:  Negative for chest pain, palpitations and leg swelling.  ?Gastrointestinal:  Negative for diarrhea, nausea and vomiting.  ?Neurological:  Negative for dizziness, speech difficulty, weakness and headaches.  ? ?Physical Exam ?Updated Vital Signs ?BP (!) 161/98 (BP Location: Right Arm)   Pulse 66   Temp 97.6 ?F (36.4 ?C) (Oral)   Resp 16   Ht 5\' 8"  (1.727 m)   Wt 68.9 kg   SpO2 100%   BMI 23.11 kg/m?  ?Physical Exam ?Vitals reviewed.  ?Constitutional:   ?   General: He is not in acute distress. ?   Appearance: Normal appearance.  ?HENT:  ?   Head: Normocephalic and atraumatic.  ?   Right Ear: External ear normal.  ?   Left Ear: External ear normal.  ?   Nose: No congestion or rhinorrhea.  ?   Mouth/Throat:  ?   Mouth: Mucous membranes are moist.  ?   Pharynx: Oropharynx is clear.  ?Eyes:  ?   General: No scleral icterus.    ?   Right eye: No discharge.     ?   Left eye: No discharge.  ?   Extraocular Movements:  Extraocular movements intact.  ?   Conjunctiva/sclera: Conjunctivae normal.  ?Cardiovascular:  ?   Rate and Rhythm: Normal rate and regular rhythm.  ?   Pulses: Normal pulses.  ?   Heart sounds: No murmur heard. ?  No gallop.  ?Pulmonary:  ?   Effort: Pulmonary effort is normal. No respiratory distress.  ?   Breath sounds: Normal breath sounds. No wheezing or rhonchi.  ?Abdominal:  ?   General: Abdomen is flat. Bowel sounds are normal. There is no distension.  ?   Palpations: Abdomen is soft.  ?   Tenderness: There is no abdominal tenderness.  ?Musculoskeletal:     ?   General: No swelling.  ?  Cervical back: No tenderness.  ?   Right lower leg: No edema.  ?   Comments: Left AKA  ?Skin: ?   General: Skin is warm and dry.  ?   Coloration: Skin is not pale.  ?   Findings: No erythema.  ?Neurological:  ?   General: No focal deficit present.  ?   Mental Status: He is alert and oriented to person, place, and time. Mental status is at baseline.  ?Psychiatric:     ?   Mood and Affect: Mood normal.     ?   Behavior: Behavior normal.  ? ? ?ED Results / Procedures / Treatments   ?Labs ?(all labs ordered are listed, but only abnormal results are displayed) ?Labs Reviewed  ?BASIC METABOLIC PANEL - Abnormal; Notable for the following components:  ?    Result Value  ? Glucose, Bld 112 (*)   ? All other components within normal limits  ?CBC WITH DIFFERENTIAL/PLATELET - Abnormal; Notable for the following components:  ? Hemoglobin 11.8 (*)   ? HCT 36.1 (*)   ? MCV 68.9 (*)   ? MCH 22.5 (*)   ? RDW 19.6 (*)   ? All other components within normal limits  ?TROPONIN I (HIGH SENSITIVITY)  ?TROPONIN I (HIGH SENSITIVITY)  ? ? ?EKG ?EKG Interpretation ? ?Date/Time:  Thursday Jun 24 2021 03:08:55 EDT ?Ventricular Rate:  75 ?PR Interval:  134 ?QRS Duration: 80 ?QT Interval:  380 ?QTC Calculation: 424 ?R Axis:   -27 ?Text Interpretation: Normal sinus rhythm with sinus arrhythmia Possible Anterior infarct , age undetermined ST & T wave  abnormality, consider inferolateral ischemia Abnormal ECG When compared with ECG of 22-Jun-2021 09:46, PREVIOUS ECG IS PRESENT No significant change since last tracing Confirmed by Lorre Nick ([id number]) on 06/24/2021

## 2021-06-24 NOTE — ED Provider Triage Note (Signed)
Emergency Medicine Provider Triage Evaluation Note ? ?Benjamin [name] , a 61 y.o. male  was evaluated in triage.  Pt complains of chest tightness and hypertension.  Patient has not missed any of his antihypertensive medicine, states after he takes his trazodone he starts having high blood pressure, today it was 170/100.  Also having chest tightness that feels similar to chest tightness he had on Tuesday when he was seen here in the ED.  Denies vision change or headache, no urinary symptoms. ? ?Review of Systems  ?Per HPI ? ?Physical Exam  ?There were no vitals taken for this visit. ?Gen:   Awake, no distress   ?Resp:  Normal effort  ?MSK:   Moves extremities without difficulty  ?Other:   ? ?Medical Decision Making  ?Medically screening exam initiated at 2:49 AM.  Appropriate orders placed.  Benjamin [name] was informed that the remainder of the evaluation will be completed by another provider, this initial triage assessment does not replace that evaluation, and the importance of remaining in the ED until their evaluation is complete. ? ?Repeat EKG and troponin, patient had chest x-ray two days ago it showed probable COPD but no acute findings.   ?  Theron Arista, PA-C ?06/24/21 [id number] ? ?

## 2021-06-24 NOTE — ED Triage Notes (Signed)
Pt c/o HTN at home (176/110) since beginning trazadone ?

## 2021-06-24 NOTE — ED Provider Notes (Signed)
I saw and evaluated the patient, reviewed the resident's note and I agree with the findings and plan. ? ?EKG Interpretation ? ?Date/Time:  Thursday Jun 24 2021 03:08:55 EDT ?Ventricular Rate:  75 ?PR Interval:  134 ?QRS Duration: 80 ?QT Interval:  380 ?QTC Calculation: 424 ?R Axis:   -27 ?Text Interpretation: Normal sinus rhythm with sinus arrhythmia Possible Anterior infarct , age undetermined ST & T wave abnormality, consider inferolateral ischemia Abnormal ECG When compared with ECG of 22-Jun-2021 09:46, PREVIOUS ECG IS PRESENT No significant change since last tracing Confirmed by Lorre Nick ([id number]) on 06/24/2021 1:09:13 PM ?  ? ?61 year old male presents with concern for increased blood pressure.  Patient is on multiple medications for hypertension and he has been compliant with these.  Patient's renal function here is normal.  No concern for ACS at this time.  We will adjust patient's ARB and he will follow-up with his doctor ?  ?Lorre Nick, MD ?06/24/21 1401 ? ?

## 2021-06-24 NOTE — Discharge Instructions (Addendum)
You came to the emergency department due to your high blood pressure. Please continue to take all your medications as prescribed, increase your losartan to 50 mg daily (please take 2 tablets at night). Please continue to take your carvedilol 6.25 mg twice daily as previously prescribed. Monitor your blood pressures and bring this record to your primary care provider. Please call your primary provider and make an appointment to be seen early next week for a blood pressure follow up, this way they can adjust your medications as needed. If you are experiencing chest pain, shortness of breath, vision changes along with a high blood pressure of >160 then please return to the emergency department.  ?

## 2021-06-24 NOTE — ED Notes (Signed)
Pt left without d/c paperwork or d/c vs ?

## 2021-06-25 ENCOUNTER — Ambulatory Visit: Payer: Medicaid Other

## 2021-06-25 ENCOUNTER — Telehealth: Payer: Self-pay

## 2021-06-25 ENCOUNTER — Ambulatory Visit: Payer: Medicaid Other | Admitting: Occupational Therapy

## 2021-06-25 NOTE — Telephone Encounter (Signed)
Transition Care Management Unsuccessful Follow-up Telephone Call ? ?Date of discharge and from where:  06/25/2021  ? ?Attempts:  1st Attempt ? ?Reason for unsuccessful TCM follow-up call:  Left voice message ? ?  ?

## 2021-06-27 ENCOUNTER — Emergency Department (HOSPITAL_COMMUNITY): Payer: Medicaid Other

## 2021-06-27 ENCOUNTER — Other Ambulatory Visit: Payer: Self-pay

## 2021-06-27 ENCOUNTER — Encounter (HOSPITAL_COMMUNITY): Payer: Self-pay | Admitting: Emergency Medicine

## 2021-06-27 ENCOUNTER — Ambulatory Visit (HOSPITAL_COMMUNITY)
Admission: EM | Admit: 2021-06-27 | Discharge: 2021-06-27 | Disposition: A | Discharge status: Home / Self Care | Payer: Medicaid Other

## 2021-06-27 ENCOUNTER — Emergency Department (HOSPITAL_COMMUNITY)
Admission: EM | Admit: 2021-06-27 | Discharge: 2021-06-27 | Disposition: A | Discharge status: Home / Self Care | Payer: Medicaid Other | Attending: Emergency Medicine | Admitting: Emergency Medicine

## 2021-06-27 DIAGNOSIS — Z79899 Other long term (current) drug therapy: Secondary | ICD-10-CM | POA: Insufficient documentation

## 2021-06-27 DIAGNOSIS — I10 Essential (primary) hypertension: Secondary | ICD-10-CM

## 2021-06-27 DIAGNOSIS — R0789 Other chest pain: Secondary | ICD-10-CM | POA: Diagnosis present

## 2021-06-27 DIAGNOSIS — H9319 Tinnitus, unspecified ear: Secondary | ICD-10-CM

## 2021-06-27 LAB — BASIC METABOLIC PANEL
Anion gap: 6 (ref 5–15)
BUN: 8 mg/dL (ref 8–23)
CO2: 22 mmol/L (ref 22–32)
Calcium: 8.8 mg/dL — ABNORMAL LOW (ref 8.9–10.3)
Chloride: 111 mmol/L (ref 98–111)
Creatinine, Ser: 0.85 mg/dL (ref 0.61–1.24)
GFR, Estimated: >60 mL/min (ref >60)
Glucose, Bld: 107 mg/dL — ABNORMAL HIGH (ref 70–99)
Potassium: 3.5 mmol/L (ref 3.5–5.1)
Sodium: 139 mmol/L (ref 135–145)

## 2021-06-27 LAB — CBC
HCT: 34.9 % — ABNORMAL LOW (ref 39.0–52.0)
Hemoglobin: 11.4 g/dL — ABNORMAL LOW (ref 13.0–17.0)
MCH: 22.4 pg — ABNORMAL LOW (ref 26.0–34.0)
MCHC: 32.7 g/dL (ref 30.0–36.0)
MCV: 68.4 fL — ABNORMAL LOW (ref 80.0–100.0)
Platelets: 342 10*3/uL (ref 150–400)
RBC: 5.1 MIL/uL (ref 4.22–5.81)
RDW: 19.4 % — ABNORMAL HIGH (ref 11.5–15.5)
WBC: 5.7 10*3/uL (ref 4.0–10.5)
nRBC: 0 % (ref 0.0–0.2)

## 2021-06-27 LAB — TROPONIN I (HIGH SENSITIVITY)
Troponin I (High Sensitivity): 6 ng/L (ref <18)
Troponin I (High Sensitivity): 5 ng/L (ref <18)

## 2021-06-27 MED ORDER — LOSARTAN POTASSIUM 50 MG PO TABS
25.0000 mg | ORAL_TABLET | Freq: Once | ORAL | Status: AC
  Administered 2021-06-27: 25 mg via ORAL
  Filled 2021-06-27: qty 1

## 2021-06-27 NOTE — ED Provider Notes (Signed)
?[place] ? ? ? ?CSN: [id number] ?Arrival date & time: 06/27/21  1645 ? ? ?  ? ?History   ?Chief Complaint ?Chief Complaint  ?Patient presents with  ? Dizziness  ? ? ?HPI ?Benjamin [name] is a 61 y.o. male.  ? ?Patient presents today for evaluation of labile blood pressures for the past several weeks.  He has been seen multiple times in the emergency room and urgent care due to this.  He was last seen in the ER earlier today which point he had a negative work-up.  He reports that at night his blood pressure elevates and he has associated tinnitus.  This wakes him up and makes it difficult for him to sleep.  He was experiencing some chest discomfort and lightheadedness but this is since resolved.  He is currently asymptomatic and denies any significant tinnitus, chest pain, lightheadedness, dizziness, shortness of breath.  He is monitoring his blood pressure with an at-home wrist cuff and has never compare that to other readings.  He does not take NSAIDs, decongestants, use caffeine.  He denies any increased sodium consumption.  He is not followed by cardiology. ? ? ?Past Medical History:  ?Diagnosis Date  ? Back pain   ? Heavy smoker   ? Hyperlipidemia LDL goal <70   ? Hypertension   ? 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 Bilateral femoral artery disease  ? ? ?Patient Active Problem List  ? Diagnosis Date Noted  ? Acute blood loss anemia 12/25/2020  ? Hyponatremia 12/25/2020  ? 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 Protein-calorie malnutrition, severe 12/09/2020  ? Wound cellulitis 12/07/2020  ? 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 Ischemia of lower extremity 10/01/2020  ? 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 PAD (peripheral artery disease) ([place]) 11/13/2019  ? Callus of foot 07/01/2019  ? Intermittent claudication ([place]) 01/20/2017  ? Need for influenza  vaccination 01/20/2017  ? Needs smoking cessation education 09/06/2016  ? Abnormal echocardiogram 09/05/2016  ? Abnormal EKG 07/25/2016  ? Preoperative cardiovascular examination 07/25/2016  ? Shortness of breath on exertion 07/25/2016  ? Dyslipidemia, goal LDL below 70 07/25/2016  ? High blood pressure 07/25/2016  ? Abnormal chest x-ray 07/04/2016  ? Peripheral vascular disease ([place]) 07/04/2016  ? Decreased pedal pulses 06/24/2016  ? 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 Heavy smoker 06/24/2016  ? Abnormal weight loss 06/24/2016  ? Foot pain, left 06/24/2016  ? Screening for prostate cancer 06/24/2016  ? ? ?Past Surgical History:  ?Procedure Laterality Date  ? ABDOMINAL AORTOGRAM W/LOWER EXTREMITY N/A 07/12/2016  ? Procedure: Abdominal Aortogram w/Lower Extremity;  Surgeon: Serafina Mitchell, MD;  Location: [place];  Service: Cardiovascular;  Laterality: N/A;  Bilateral extermity: Patent Renal As. No sig Dz in infrarenal Abd Aorta. Normal Bilat Iliac arteries. R SFA is 100% @ origin - recon in AK-Pop A. R PT A patent. L CFA occluded. L PFA recon @ origin, L SFA occluded w/ recon in AK Pop A.  ? ABDOMINAL AORTOGRAM W/LOWER EXTREMITY N/A 10/15/2019  ? Procedure: ABDOMINAL AORTOGRAM W/LOWER EXTREMITY;  Surgeon: Serafina Mitchell, MD;  Location: [place] [facility];  Service: Cardiovascular;  Laterality: N/A;  ? AMPUTATION Left 10/04/2020  ? Procedure: LEFT ABOVE KNEE AMPUTATION;  Surgeon: Serafina Mitchell, MD;  Location: [facility] OR;  Service: Vascular;  Laterality: Left;  ? AMPUTATION Left  12/08/2020  ? Procedure: RIGHT ABOVE KNEE AMPUTATION REVISION;  Surgeon: Angelia Mould, MD;  Location: [facility];  Service: Vascular;  Laterality: Left;  ? AORTA - BILATERAL FEMORAL ARTERY BYPASS GRAFT N/A 05/08/2020  ? Procedure: AORTA BIFEMORAL BYPASS GRAFT;  Surgeon: Serafina Mitchell, MD;  Location: [facility] OR;  Service: Vascular;  Laterality: N/A;  ? COLONOSCOPY  06/2016  ? never  ? ENDARTERECTOMY FEMORAL Left 11/14/2019  ?  Procedure: Left groin exploration, Redo left femoral artery exposure;  Surgeon: Serafina Mitchell, MD;  Location: [place];  Service: Vascular;  Laterality: Left;  ? FEMORAL-FEMORAL BYPASS GRAFT N/A 11/13/2019  ? Procedure: BYPASS GRAFT FEMORAL-FEMORAL ARTERY RIGHT TO LEFT USING HEMASHIELD GOLD GRAFT 54mm x 30cm;  Surgeon: Serafina Mitchell, MD;  Location: [facility] OR;  Service: Vascular;  Laterality: N/A;  ? FEMORAL-POPLITEAL BYPASS GRAFT Left 11/13/2019  ? Procedure: LEFT FEMORAL BELOW KNEE-POPLITEAL ARTERY USING NON-REVERSED GREATER SAPHENOUS VEIN;  Surgeon: Serafina Mitchell, MD;  Location: [facility] OR;  Service: Vascular;  Laterality: Left;  ? FEMORAL-POPLITEAL BYPASS GRAFT Left 05/08/2020  ? Procedure: LEFT REDO BYPASS GRAFT COMMON FEMORAL- BELOW KNEE POPLITEAL ARTERY USING PROPATEN GRAFT;  Surgeon: Serafina Mitchell, MD;  Location: [facility] OR;  Service: Vascular;  Laterality: Left;  ? I & D EXTREMITY Left 09/07/2020  ? Procedure: IRRIGATION AND DEBRIDEMENT LEFT LEG WITH EXCISION OF LEFT LEG DISTAL BYPASS GRAFT;  Surgeon: Marty Heck, MD;  Location: [place];  Service: Vascular;  Laterality: Left;  ? LOWER EXTREMITY ANGIOGRAM Left 11/14/2019  ? Procedure: LEFT LOWER EXTREMITY ANGIOGRAM, BYPASS GRAFT ANGIOPLASTY;  Surgeon: Serafina Mitchell, MD;  Location: [place];  Service: Vascular;  Laterality: Left;  ? PERIPHERAL VASCULAR INTERVENTION  10/15/2019  ? Procedure: PERIPHERAL VASCULAR INTERVENTION;  Surgeon: Serafina Mitchell, MD;  Location: [place] CV LAB;  Service: Cardiovascular;;  Rt Iliac  ? REMOVAL OF GRAFT Left 10/04/2020  ? Procedure: REMOVAL OF LEFT FEMORAL TO POPITEAL BYPASS GRAFT;  Surgeon: Serafina Mitchell, MD;  Location: [facility] OR;  Service: Vascular;  Laterality: Left;  ? ? ? ? ? ?Home Medications   ? ?Prior to Admission medications   ?Medication Sig Start Date End Date Taking? Authorizing Provider  ?acetaminophen (TYLENOL) 325 MG tablet Take 1-2 tablets (325-650 mg total) by mouth every 4 (four) hours as needed for mild  pain. ?Patient not taking: Reported on 06/22/2021 12/25/20   Bary Leriche, PA-C  ?acetaminophen (TYLENOL) 500 MG tablet Take 500-1,000 mg by mouth every 8 (eight) hours as needed for mild pain (or tinnitus).    [provider]  ?albuterol (VENTOLIN HFA) 108 (90 Base) MCG/ACT inhaler Inhale 1-2 puffs into the lungs every 6 (six) hours as needed for wheezing or shortness of breath. 06/12/21   Jeanell Sparrow, DO  ?atorvastatin (LIPITOR) 20 MG tablet Take 1 tablet (20 mg total) by mouth daily. ?Patient taking differently: Take 20 mg by mouth at bedtime. 03/23/21   Raulkar, Clide Deutscher, MD  ?atorvastatin (LIPITOR) 20 MG tablet Take 1 tablet by mouth daily 06/23/21     ?carvedilol (COREG) 6.25 MG tablet Take 1 tablet (6.25 mg total) by mouth 2 (two) times daily with a meal. 06/01/21 07/31/21  Trifan, Carola Rhine, MD  ?cloNIDine (CATAPRES - DOSED IN MG/24 HR) 0.1 mg/24hr patch Place 0.1 mg onto the skin once a week.    [provider]  ?clopidogrel (PLAVIX) 75 MG tablet Take 1 tablet (75 mg total) by mouth daily. 05/11/21     ?  docusate sodium (COLACE) 100 MG capsule Take 1 capsule (100 mg total) by mouth daily. ?Patient not taking: Reported on 06/22/2021 03/16/21   Izora Ribas, MD  ?gabapentin (NEURONTIN) 600 MG tablet Take 1 tablet (600 mg total) by mouth 2 (two) times daily. 03/22/21   Raulkar, Clide Deutscher, MD  ?guaiFENesin-dextromethorphan (ROBITUSSIN DM) 100-10 MG/5ML syrup Take 15 mLs by mouth every 4 (four) hours as needed for cough. ?Patient not taking: Reported on 06/22/2021 12/15/20   Terrilee Croak, MD  ?hydrOXYzine (ATARAX) 25 MG tablet Take 1 tablet (25 mg total) by mouth at bedtime for anxiety ?Patient taking differently: Take 25 mg by mouth at bedtime as needed for anxiety. 06/01/21   [name], [place]  ?losartan (COZAAR) 25 MG tablet Take 1 tablet (25 mg total) by mouth daily. ?Patient not taking: Reported on 06/22/2021 03/23/21   Izora Ribas, MD  ?losartan (COZAAR) 25 MG tablet Take 1 tablet (25 mg  total) by mouth daily. 06/01/21 07/31/21  Wyvonnia Dusky, MD  ?methocarbamol (ROBAXIN) 500 MG tablet Take 1 tablet (500 mg total) by mouth every 6 (six) hours as needed for muscle spasms. ?Patient not [place]

## 2021-06-27 NOTE — ED Provider Notes (Signed)
?[name] EMERGENCY DEPARTMENT ?Provider Note ? ? ?CSN: [id number] ?Arrival date & time: 06/27/21  0410 ? ?  ? ?History ? ?Chief Complaint  ?Patient presents with  ? Chest Pain  ? ? ?SAMARTH OGLE is a 61 y.o. male. ? ?61 year old male with prior medical history as detailed below presents for evaluation.  Patient complains of elevated blood pressure.  Patient reports that his pressure was high and he felt "pressure behind his eyes" and "chest discomfort."  These are symptoms typical of his elevated blood pressure.   ? ?Patient was seen for similar complaint on May 4. ? ?Work-up at that time was without significant abnormality ? ?Patient reports that he did take his a.m. dose of carvedilol around 4:00 this morning.  He did not take any of his other morning medications. ?He reports having blood pressures at home today up to the 170s systolic. ? ?The history is provided by the patient and medical records.  ?Illness ?Location:  Elevated blood pressure, dizziness, chest discomfort ?Severity:  Mild ?Onset quality:  Gradual ?Duration:  12 hours ?Timing:  Sporadic ?Progression:  Waxing and waning ?Chronicity:  Recurrent ? ?  ? ?Home Medications ?Prior to Admission medications   ?Medication Sig Start Date End Date Taking? Authorizing Provider  ?acetaminophen (TYLENOL) 325 MG tablet Take 1-2 tablets (325-650 mg total) by mouth every 4 (four) hours as needed for mild pain. ?Patient not taking: Reported on 06/22/2021 12/25/20   Jacquelynn Cree, PA-C  ?acetaminophen (TYLENOL) 500 MG tablet Take 500-1,000 mg by mouth every 8 (eight) hours as needed for mild pain (or tinnitus).    [provider]  ?albuterol (VENTOLIN HFA) 108 (90 Base) MCG/ACT inhaler Inhale 1-2 puffs into the lungs every 6 (six) hours as needed for wheezing or shortness of breath. 06/12/21   Sloan Leiter, DO  ?atorvastatin (LIPITOR) 20 MG tablet Take 1 tablet (20 mg total) by mouth daily. ?Patient taking differently: Take 20 mg by mouth  at bedtime. 03/23/21   Raulkar, Drema Pry, MD  ?atorvastatin (LIPITOR) 20 MG tablet Take 1 tablet by mouth daily 06/23/21     ?carvedilol (COREG) 6.25 MG tablet Take 1 tablet (6.25 mg total) by mouth 2 (two) times daily with a meal. 06/01/21 07/31/21  Trifan, Kermit Balo, MD  ?cloNIDine (CATAPRES - DOSED IN MG/24 HR) 0.1 mg/24hr patch Place 0.1 mg onto the skin once a week.    [provider]  ?clopidogrel (PLAVIX) 75 MG tablet Take 1 tablet (75 mg total) by mouth daily. 05/11/21     ?docusate sodium (COLACE) 100 MG capsule Take 1 capsule (100 mg total) by mouth daily. ?Patient not taking: Reported on 06/22/2021 03/16/21   Horton Chin, MD  ?gabapentin (NEURONTIN) 600 MG tablet Take 1 tablet (600 mg total) by mouth 2 (two) times daily. 03/22/21   Raulkar, Drema Pry, MD  ?guaiFENesin-dextromethorphan (ROBITUSSIN DM) 100-10 MG/5ML syrup Take 15 mLs by mouth every 4 (four) hours as needed for cough. ?Patient not taking: Reported on 06/22/2021 12/15/20   Lorin Glass, MD  ?hydrOXYzine (ATARAX) 25 MG tablet Take 1 tablet (25 mg total) by mouth at bedtime for anxiety ?Patient taking differently: Take 25 mg by mouth at bedtime as needed for anxiety. 06/01/21   Arnette Felts, FNP  ?losartan (COZAAR) 25 MG tablet Take 1 tablet (25 mg total) by mouth daily. ?Patient not taking: Reported on 06/22/2021 03/23/21   Horton Chin, MD  ?losartan (COZAAR) 25 MG tablet Take 1 tablet (  25 mg total) by mouth daily. 06/01/21 07/31/21  Terald Sleeperrifan, Matthew J, MD  ?methocarbamol (ROBAXIN) 500 MG tablet Take 1 tablet (500 mg total) by mouth every 6 (six) hours as needed for muscle spasms. ?Patient not taking: Reported on 06/22/2021 12/25/20   Jacquelynn CreeLove, Pamela S, PA-C  ?Multiple Vitamin (MULTIVITAMIN WITH MINERALS) TABS tablet Take 1 tablet by mouth daily. ?Patient not taking: Reported on 06/22/2021 12/16/20   Lorin Glassahal, Binaya, MD  ?NON FORMULARY 1-5 drops See admin instructions. Unnamed tinnitus relief ear drops- Instill 1-5 drops into the affected ear(s)  two times a day as needed for tinnitus    [provider]  ?nortriptyline (PAMELOR) 50 MG capsule Take 2 capsules (100 mg total) by mouth at bedtime. 06/22/21   Arnette FeltsMoore, Janece, FNP  ?senna (SENOKOT) 8.6 MG TABS tablet Take 1 tablet (8.6 mg total) by mouth 2 (two) times daily. ?Patient not taking: Reported on 06/22/2021 12/25/20   Jacquelynn CreeLove, Pamela S, PA-C  ?Vitamins-Lipotropics (LIPO-FLAVONOID PLUS PO) Take 1 capsule by mouth 2 (two) times daily as needed (for tinnitus).    [provider]  ?   ? ?Allergies    ?Gabapentin, Tetanus toxoids, Aspirin, and Garlic   ? ?Review of Systems   ?Review of Systems  ?All other systems reviewed and are negative. ? ?Physical Exam ?Updated Vital Signs ?BP (!) 155/85   Pulse 76   Temp 97.7 ?F (36.5 ?C) (Oral)   Resp 18   Wt 69 kg   SpO2 96%   BMI 23.13 kg/m?  ?Physical Exam ?Vitals and nursing note reviewed.  ?Constitutional:   ?   General: He is not in acute distress. ?   Appearance: Normal appearance. He is well-developed.  ?HENT:  ?   Head: Normocephalic and atraumatic.  ?Eyes:  ?   Conjunctiva/sclera: Conjunctivae normal.  ?   Pupils: Pupils are equal, round, and reactive to light.  ?Cardiovascular:  ?   Rate and Rhythm: Normal rate and regular rhythm.  ?   Heart sounds: Normal heart sounds.  ?Pulmonary:  ?   Effort: Pulmonary effort is normal. No respiratory distress.  ?   Breath sounds: Normal breath sounds.  ?Abdominal:  ?   General: There is no distension.  ?   Palpations: Abdomen is soft.  ?   Tenderness: There is no abdominal tenderness.  ?Musculoskeletal:     ?   General: No deformity. Normal range of motion.  ?   Cervical back: Normal range of motion and neck supple.  ?Skin: ?   General: Skin is warm and dry.  ?Neurological:  ?   General: No focal deficit present.  ?   Mental Status: He is alert and oriented to person, place, and time.  ? ? ?ED Results / Procedures / Treatments   ?Labs ?(all labs ordered are listed, but only abnormal results are  displayed) ?Labs Reviewed  ?BASIC METABOLIC PANEL - Abnormal; Notable for the following components:  ?    Result Value  ? Glucose, Bld 107 (*)   ? Calcium 8.8 (*)   ? All other components within normal limits  ?CBC - Abnormal; Notable for the following components:  ? Hemoglobin 11.4 (*)   ? HCT 34.9 (*)   ? MCV 68.4 (*)   ? MCH 22.4 (*)   ? RDW 19.4 (*)   ? All other components within normal limits  ?TROPONIN I (HIGH SENSITIVITY)  ?TROPONIN I (HIGH SENSITIVITY)  ? ? ?EKG ?None ? ?Radiology ?DG Chest 2 View ? ?  Result Date: 06/27/2021 ?CLINICAL DATA:  61 year old male with chest pain. EXAM: CHEST - 2 VIEW COMPARISON:  Chest radiographs 06/22/2021 and earlier. FINDINGS: AP and lateral views at 0448 hours. Stable lung volumes with flattening of the diaphragm again noted. Normal cardiac size and mediastinal contours. Visualized tracheal air column is within normal limits. Lung markings appear stable. No pneumothorax, pulmonary edema, pleural effusion or acute pulmonary opacity. No acute osseous abnormality identified. Negative visible bowel gas. IMPRESSION: Chronic pulmonary hyperinflation suspected. No acute cardiopulmonary abnormality. Electronically Signed   By: Odessa Fleming M.D.   On: 06/27/2021 05:15   ? ?Procedures ?Procedures  ? ? ?Medications Ordered in ED ?Medications  ?losartan (COZAAR) tablet 25 mg (has no administration in time range)  ? ? ?ED Course/ Medical Decision Making/ A&P ?  ?                        ?Medical Decision Making ?Amount and/or Complexity of Data Reviewed ?Labs: ordered. ?Radiology: ordered. ? ?Risk ?Prescription drug management. ? ? ? ?Medical Screen Complete ? ?This patient presented to the ED with complaint of hypertension. ? ?This complaint involves an extensive number of treatment options. The initial differential diagnosis includes, but is not limited to, fully controlled hypertension, ACS, metabolic abnormality, etc. ? ?This presentation is: Acute, Chronic, Self-Limited, Previously  Undiagnosed, Uncertain Prognosis, Complicated, Systemic Symptoms, and Threat to Life/Bodily Function ? ?Patient with longstanding history of hypertension that appears poorly controlled presents with complaint of elevated blood

## 2021-06-27 NOTE — ED Triage Notes (Signed)
Reports last night noticed ringing in ears and noticed blood pressure going up and down.  Patient currently has a wrist blood pressure monitor on right wrist.   ? ?Patient was in ED last night for the same issues.   ?

## 2021-06-27 NOTE — ED Triage Notes (Addendum)
Presents for central CP that is preventing him from pain, 9/10, non-radiating. No nitro or ASA at home. Tried tylenol for pain.  ?Endorses dizziness, high blood pressure at home (178/110). ?Quit smoking 58yrs ago.Does take a blood thinner ?Denies cough, fever, chills, urinary sx ?

## 2021-06-27 NOTE — Discharge Instructions (Signed)
Please replace the batteries or get a new blood pressure monitoring device as it does not seem to be accurate.  As we discussed, you should monitor your blood pressure first thing in the morning but only take it 1 time per day.  Avoid caffeine, sodium, decongestants.  You should not take NSAIDs including aspirin, ibuprofen/Advil, naproxen/Aleve.  Use a white noise machine to help drown out the ringing in your ears at night.  You should follow-up with your primary care provider next week if you cannot see them you can return here.  Keep track of your blood pressures and bring that with you so we can adjust your medication if needed.  If you have any worsening symptoms you need to go to the emergency room as we discussed. ?

## 2021-06-27 NOTE — Discharge Instructions (Signed)
Return for any problem.  ? ?Continue to take your previously prescribed anti-hypertensive medications. ?

## 2021-06-28 ENCOUNTER — Ambulatory Visit: Payer: Medicaid Other | Admitting: Nurse Practitioner

## 2021-06-28 ENCOUNTER — Other Ambulatory Visit: Payer: Self-pay

## 2021-06-28 ENCOUNTER — Encounter: Payer: Self-pay | Admitting: Nurse Practitioner

## 2021-06-28 VITALS — BP 144/84 | HR 87 | Temp 98.5°F | Ht 68.0 in | Wt 152.0 lb

## 2021-06-28 DIAGNOSIS — I10 Essential (primary) hypertension: Secondary | ICD-10-CM | POA: Diagnosis not present

## 2021-06-28 DIAGNOSIS — F419 Anxiety disorder, unspecified: Secondary | ICD-10-CM | POA: Diagnosis not present

## 2021-06-28 DIAGNOSIS — Z89612 Acquired absence of left leg above knee: Secondary | ICD-10-CM | POA: Diagnosis not present

## 2021-06-28 MED ORDER — NORTRIPTYLINE HCL 50 MG PO CAPS
50.0000 mg | ORAL_CAPSULE | Freq: Every day | ORAL | 2 refills | Status: DC
  Filled 2021-06-28: qty 30, 30d supply, fill #0

## 2021-06-28 MED ORDER — ATORVASTATIN CALCIUM 20 MG PO TABS
20.0000 mg | ORAL_TABLET | Freq: Every day | ORAL | 1 refills | Status: DC
  Filled 2021-06-28: qty 90, 90d supply, fill #0

## 2021-06-28 MED ORDER — LOSARTAN POTASSIUM 50 MG PO TABS
50.0000 mg | ORAL_TABLET | Freq: Two times a day (BID) | ORAL | 1 refills | Status: DC
  Filled 2021-06-28: qty 180, 90d supply, fill #0

## 2021-06-28 NOTE — Patient Instructions (Signed)
Hypertension, Adult High blood pressure (hypertension) is when the force of blood pumping through the arteries is too strong. The arteries are the blood vessels that carry blood from the heart throughout the body. Hypertension forces the heart to work harder to pump blood and may cause arteries to become narrow or stiff. Untreated or uncontrolled hypertension can lead to a heart attack, heart failure, a stroke, kidney disease, and other problems. A blood pressure reading consists of a higher number over a lower number. Ideally, your blood pressure should be below 120/80. The first ("top") number is called the systolic pressure. It is a measure of the pressure in your arteries as your heart beats. The second ("bottom") number is called the diastolic pressure. It is a measure of the pressure in your arteries as the heart relaxes. What are the causes? The exact cause of this condition is not known. There are some conditions that result in high blood pressure. What increases the risk? Certain factors may make you more likely to develop high blood pressure. Some of these risk factors are under your control, including: Smoking. Not getting enough exercise or physical activity. Being overweight. Having too much fat, sugar, calories, or salt (sodium) in your diet. Drinking too much alcohol. Other risk factors include: Having a personal history of heart disease, diabetes, high cholesterol, or kidney disease. Stress. Having a family history of high blood pressure and high cholesterol. Having obstructive sleep apnea. Age. The risk increases with age. What are the signs or symptoms? High blood pressure may not cause symptoms. Very high blood pressure (hypertensive crisis) may cause: Headache. Fast or irregular heartbeats (palpitations). Shortness of breath. Nosebleed. Nausea and vomiting. Vision changes. Severe chest pain, dizziness, and seizures. How is this diagnosed? This condition is diagnosed by  measuring your blood pressure while you are seated, with your arm resting on a flat surface, your legs uncrossed, and your feet flat on the floor. The cuff of the blood pressure monitor will be placed directly against the skin of your upper arm at the level of your heart. Blood pressure should be measured at least twice using the same arm. Certain conditions can cause a difference in blood pressure between your right and left arms. If you have a high blood pressure reading during one visit or you have normal blood pressure with other risk factors, you may be asked to: Return on a different day to have your blood pressure checked again. Monitor your blood pressure at home for 1 week or longer. If you are diagnosed with hypertension, you may have other blood or imaging tests to help your health care provider understand your overall risk for other conditions. How is this treated? This condition is treated by making healthy lifestyle changes, such as eating healthy foods, exercising more, and reducing your alcohol intake. You may be referred for counseling on a healthy diet and physical activity. Your health care provider may prescribe medicine if lifestyle changes are not enough to get your blood pressure under control and if: Your systolic blood pressure is above 130. Your diastolic blood pressure is above 80. Your personal target blood pressure may vary depending on your medical conditions, your age, and other factors. Follow these instructions at home: Eating and drinking  Eat a diet that is high in fiber and potassium, and low in sodium, added sugar, and fat. An example of this eating plan is called the DASH diet. DASH stands for Dietary Approaches to Stop Hypertension. To eat this way: Eat   plenty of fresh fruits and vegetables. Try to fill one half of your plate at each meal with fruits and vegetables. Eat whole grains, such as whole-wheat pasta, brown rice, or whole-grain bread. Fill about one  fourth of your plate with whole grains. Eat or drink low-fat dairy products, such as skim milk or low-fat yogurt. Avoid fatty cuts of meat, processed or cured meats, and poultry with skin. Fill about one fourth of your plate with lean proteins, such as fish, chicken without skin, beans, eggs, or tofu. Avoid pre-made and processed foods. These tend to be higher in sodium, added sugar, and fat. Reduce your daily sodium intake. Many people with hypertension should eat less than 1,500 mg of sodium a day. Do not drink alcohol if: Your health care provider tells you not to drink. You are pregnant, may be pregnant, or are planning to become pregnant. If you drink alcohol: Limit how much you have to: 0-1 drink a day for women. 0-2 drinks a day for men. Know how much alcohol is in your drink. In the [place] one drink equals one 12 oz bottle of beer (355 mL), one 5 oz glass of wine (148 mL), or one 1 oz glass of hard liquor (44 mL). Lifestyle  Work with your health care provider to maintain a healthy body weight or to lose weight. Ask what an ideal weight is for you. Get at least 30 minutes of exercise that causes your heart to beat faster (aerobic exercise) most days of the week. Activities may include walking, swimming, or biking. Include exercise to strengthen your muscles (resistance exercise), such as Pilates or lifting weights, as part of your weekly exercise routine. Try to do these types of exercises for 30 minutes at least 3 days a week. Do not use any products that contain nicotine or tobacco. These products include cigarettes, chewing tobacco, and vaping devices, such as e-cigarettes. If you need help quitting, ask your health care provider. Monitor your blood pressure at home as told by your health care provider. Keep all follow-up visits. This is important. Medicines Take over-the-counter and prescription medicines only as told by your health care provider. Follow directions carefully. Blood  pressure medicines must be taken as prescribed. Do not skip doses of blood pressure medicine. Doing this puts you at risk for problems and can make the medicine less effective. Ask your health care provider about side effects or reactions to medicines that you should watch for. Contact a health care provider if you: Think you are having a reaction to a medicine you are taking. Have headaches that keep coming back (recurring). Feel dizzy. Have swelling in your ankles. Have trouble with your vision. Get help right away if you: Develop a severe headache or confusion. Have unusual weakness or numbness. Feel faint. Have severe pain in your chest or abdomen. Vomit repeatedly. Have trouble breathing. These symptoms may be an emergency. Get help right away. Call 911. Do not wait to see if the symptoms will go away. Do not drive yourself to the hospital. Summary Hypertension is when the force of blood pumping through your arteries is too strong. If this condition is not controlled, it may put you at risk for serious complications. Your personal target blood pressure may vary depending on your medical conditions, your age, and other factors. For most people, a normal blood pressure is less than 120/80. Hypertension is treated with lifestyle changes, medicines, or a combination of both. Lifestyle changes include losing weight, eating a healthy,   low-sodium diet, exercising more, and limiting alcohol. This information is not intended to replace advice given to you by your health care provider. Make sure you discuss any questions you have with your health care provider. Document Revised: 12/15/2020 Document Reviewed: 12/15/2020 [employer]vier Patient Education  2023 [employer]  

## 2021-06-28 NOTE — Progress Notes (Signed)
?Clinical biochemist as a Neurosurgeon for [employer], FNP.,have documented all relevant documentation on the behalf of Arnette Felts, FNP,as directed by  Arnette Felts, FNP while in the presence of Arnette Felts, FNP. ? ?This visit occurred during the SARS-CoV-2 public health emergency.  Safety protocols were in place, including screening questions prior to the visit, additional usage of staff PPE, and extensive cleaning of exam room while observing appropriate contact time as indicated for disinfecting solutions. ? ?Subjective:  ?  ? Patient ID: Benjamin [name] , male    DOB: [birth year] , 61 y.o.   MRN: [id number] ? ? ?Chief Complaint  ?Patient presents with  ? Hypertension  ? ? ?HPI ? ?Patient presents today for elevated blood pressure. He reports he continues to have ringing in his ears when his blood pressure is elevated. He has not taken the nortriptylline that was ordered. He has been to the ER again for his elevated blood pressure and had been having chest pain.  ? ?I have been in communication with the [facility] to get assistance with resources. He has had a [facility] nurse to visit to discuss their program to have nursing and providers to visit patient at the home.   ? ?Hypertension ?This is a chronic problem. The current episode started more than 1 year ago. The problem has been gradually improving since onset. Associated symptoms include anxiety. There are no associated agents to hypertension. Risk factors for coronary artery disease include male gender. Past treatments include alpha 1 blockers. There is no history of angina or kidney disease. There is no history of chronic renal disease.   ? ?Past Medical History:  ?Diagnosis Date  ? Back pain   ? Heavy smoker   ? Hyperlipidemia LDL goal <70   ? Hypertension   ? 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  ? Bilateral femoral artery disease  ?  ? ?Family History  ?Problem Relation Age of Onset  ? Arthritis Mother   ? COPD Father   ? Stroke Father   ?  Multiple sclerosis Sister   ? ? ? ?Current Outpatient Medications:  ?  acetaminophen (TYLENOL) 325 MG tablet, Take 1-2 tablets (325-650 mg total) by mouth every 4 (four) hours as needed for mild pain. (Patient not taking: Reported on 06/22/2021), Disp: 200 tablet, Rfl: 0 ?  albuterol (VENTOLIN HFA) 108 (90 Base) MCG/ACT inhaler, Inhale 1-2 puffs into the lungs every 6 (six) hours as needed for wheezing or shortness of breath., Disp: 18 g, Rfl: 0 ?  atorvastatin (LIPITOR) 20 MG tablet, Take 1 tablet (20 mg total) by mouth at bedtime., Disp: 90 tablet, Rfl: 1 ?  carvedilol (COREG) 6.25 MG tablet, Take 1 tablet (6.25 mg total) by mouth 2 (two) times daily with a meal., Disp: 120 tablet, Rfl: 0 ?  cloNIDine (CATAPRES - DOSED IN MG/24 HR) 0.1 mg/24hr patch, Place 1 patch (0.1 mg total) onto the skin once a week., Disp: 4 patch, Rfl: 12 ?  clopidogrel (PLAVIX) 75 MG tablet, Take 1 tablet (75 mg total) by mouth daily., Disp: 30 tablet, Rfl: 0 ?  docusate sodium (COLACE) 100 MG capsule, Take 1 capsule (100 mg total) by mouth daily. (Patient not taking: Reported on 06/22/2021), Disp: 30 capsule, Rfl: 0 ?  gabapentin (NEURONTIN) 600 MG tablet, Take 1 tablet (600 mg total) by mouth 2 (two) times daily., Disp: 60 tablet, Rfl: 3 ?  guaiFENesin-dextromethorphan (ROBITUSSIN DM) 100-10 MG/5ML syrup, Take 15 mLs by mouth every 4 (four) hours as  needed for cough. (Patient not taking: Reported on 06/22/2021), Disp: 118 mL, Rfl: 0 ?  losartan (COZAAR) 50 MG tablet, Take 1 tablet (50 mg total) by mouth in the morning and at bedtime., Disp: 180 tablet, Rfl: 1 ?  methocarbamol (ROBAXIN) 500 MG tablet, Take 1 tablet (500 mg total) by mouth every 6 (six) hours as needed for muscle spasms. (Patient not taking: Reported on 06/22/2021), Disp: 30 tablet, Rfl: 0 ?  Multiple Vitamin (MULTIVITAMIN WITH MINERALS) TABS tablet, Take 1 tablet by mouth daily. (Patient not taking: Reported on 06/22/2021), Disp: , Rfl:  ?  NON FORMULARY, 1-5 drops See admin  instructions. Unnamed tinnitus relief ear drops- Instill 1-5 drops into the affected ear(s) two times a day as needed for tinnitus, Disp: , Rfl:  ?  nortriptyline (PAMELOR) 50 MG capsule, Take 2 capsules (100 mg total) by mouth at bedtime., Disp: 30 capsule, Rfl: 0 ?  senna (SENOKOT) 8.6 MG TABS tablet, Take 1 tablet (8.6 mg total) by mouth 2 (two) times daily. (Patient not taking: Reported on 06/22/2021), Disp: 60 tablet, Rfl: 0 ?  Vitamins-Lipotropics (LIPO-FLAVONOID PLUS PO), Take 1 capsule by mouth 2 (two) times daily as needed (for tinnitus)., Disp: , Rfl:   ? ?Allergies  ?Allergen Reactions  ? Gabapentin Other (See Comments)  ?  Causes lethargy  ? Tetanus Toxoids Other (See Comments)  ?  Tinnitus started after he took this  ? Aspirin Nausea And Vomiting  ? Garlic Other (See Comments)  ?  Sneezing ?  ?  ? ?Review of Systems  ?Constitutional: Negative.   ?HENT:  Positive for tinnitus.   ?Respiratory: Negative.    ?Cardiovascular: Negative.   ?Gastrointestinal: Negative.   ?Neurological: Negative.    ? ?Today's Vitals  ? 06/28/21 1207  ?BP: (!) 144/84  ?Pulse: 87  ?Temp: 98.5 ?F (36.9 ?C)  ?TempSrc: Oral  ?Weight: 152 lb (68.9 kg)  ?Height: 5\' 8"  (1.727 m)  ? ?Body mass index is 23.11 kg/m?.  ? ?Objective:  ?Physical Exam ?Vitals reviewed.  ?Constitutional:   ?   General: He is not in acute distress. ?   Appearance: Normal appearance.  ?Cardiovascular:  ?   Pulses: Normal pulses.  ?   Heart sounds: Normal heart sounds. No murmur heard. ?Pulmonary:  ?   Effort: Pulmonary effort is normal. No respiratory distress.  ?   Breath sounds: Normal breath sounds. No wheezing.  ?Musculoskeletal:  ?   Comments: Left AKA, using walker to ambulate  ?Skin: ?   Capillary Refill: Capillary refill takes less than 2 seconds.  ?Neurological:  ?   General: No focal deficit present.  ?   Mental Status: He is alert and oriented to person, place, and time.  ?   Cranial Nerves: No cranial nerve deficit.  ?   Motor: No weakness.   ?Psychiatric:     ?   Mood and Affect: Mood normal.     ?   Behavior: Behavior normal.     ?   Thought Content: Thought content normal.     ?   Judgment: Judgment normal.  ?  ? ?   ?Assessment And Plan:  ?   ?1. Uncontrolled hypertension ?Comments: Blood pressure is improving slightly, he is to continue the blood pressure medications.  Continue to eat a low salt diet.   ?- [facility] ?- Ambulatory referral to Cardiology ? ?2. Anxiety ?Comments: He is to take nortriptyline at night to help with his anxiety  and this may also help his blood pressure.  ? ?3. S/P AKA (above knee amputation), left (HCC) ?- [facility] ?  ? ? ?Patient was given opportunity to ask questions. Patient verbalized understanding of the plan and was able to repeat key elements of the plan. All questions were answered to their satisfaction.  ?Arnette FeltsJanece Corin Tilly, FNP  ? ?I, Arnette FeltsJanece Jeriko Kowalke, FNP, have reviewed all documentation for this visit. The documentation on 06/28/21 for the exam, diagnosis, procedures, and orders are all accurate and complete.  ? ?IF YOU HAVE BEEN REFERRED TO A SPECIALIST, IT MAY TAKE 1-2 WEEKS TO SCHEDULE/PROCESS THE REFERRAL. IF YOU HAVE NOT HEARD FROM US/SPECIALIST IN TWO WEEKS, PLEASE GIVE US A CALL AT [phone].  ? ?THE PATIENT IS ENCOURAGED TO PRACTICE SOCIAL DISTANCING DUE TO THE COVID-19 PANDEMIC.   ?

## 2021-06-30 ENCOUNTER — Other Ambulatory Visit: Payer: Medicaid Other

## 2021-06-30 ENCOUNTER — Emergency Department (HOSPITAL_COMMUNITY)
Admission: EM | Admit: 2021-06-30 | Discharge: 2021-06-30 | Disposition: A | Discharge status: Home / Self Care | Payer: Medicaid Other | Attending: Emergency Medicine | Admitting: Emergency Medicine

## 2021-06-30 ENCOUNTER — Ambulatory Visit: Payer: Medicaid Other | Admitting: Rehabilitation

## 2021-06-30 ENCOUNTER — Encounter (HOSPITAL_COMMUNITY): Payer: Self-pay

## 2021-06-30 ENCOUNTER — Ambulatory Visit: Payer: Medicaid Other | Admitting: Occupational Therapy

## 2021-06-30 ENCOUNTER — Other Ambulatory Visit (HOSPITAL_COMMUNITY): Payer: Self-pay

## 2021-06-30 ENCOUNTER — Other Ambulatory Visit: Payer: Self-pay

## 2021-06-30 DIAGNOSIS — R710 Precipitous drop in hematocrit: Secondary | ICD-10-CM | POA: Insufficient documentation

## 2021-06-30 DIAGNOSIS — I10 Essential (primary) hypertension: Secondary | ICD-10-CM | POA: Diagnosis present

## 2021-06-30 DIAGNOSIS — R0789 Other chest pain: Secondary | ICD-10-CM | POA: Insufficient documentation

## 2021-06-30 DIAGNOSIS — H9319 Tinnitus, unspecified ear: Secondary | ICD-10-CM | POA: Diagnosis not present

## 2021-06-30 DIAGNOSIS — E876 Hypokalemia: Secondary | ICD-10-CM | POA: Diagnosis not present

## 2021-06-30 DIAGNOSIS — Z79899 Other long term (current) drug therapy: Secondary | ICD-10-CM | POA: Diagnosis not present

## 2021-06-30 DIAGNOSIS — R739 Hyperglycemia, unspecified: Secondary | ICD-10-CM | POA: Diagnosis not present

## 2021-06-30 DIAGNOSIS — Z7902 Long term (current) use of antithrombotics/antiplatelets: Secondary | ICD-10-CM | POA: Diagnosis not present

## 2021-06-30 LAB — COMPREHENSIVE METABOLIC PANEL
ALT: 15 U/L (ref 0–44)
AST: 18 U/L (ref 15–41)
Albumin: 3.6 g/dL (ref 3.5–5.0)
Alkaline Phosphatase: 150 U/L — ABNORMAL HIGH (ref 38–126)
Anion gap: 7 (ref 5–15)
BUN: 8 mg/dL (ref 8–23)
CO2: 24 mmol/L (ref 22–32)
Calcium: 8.8 mg/dL — ABNORMAL LOW (ref 8.9–10.3)
Chloride: 109 mmol/L (ref 98–111)
Creatinine, Ser: 0.92 mg/dL (ref 0.61–1.24)
GFR, Estimated: >60 mL/min (ref >60)
Glucose, Bld: 131 mg/dL — ABNORMAL HIGH (ref 70–99)
Potassium: 3.3 mmol/L — ABNORMAL LOW (ref 3.5–5.1)
Sodium: 140 mmol/L (ref 135–145)
Total Bilirubin: 0.5 mg/dL (ref 0.3–1.2)
Total Protein: 6.7 g/dL (ref 6.5–8.1)

## 2021-06-30 LAB — CBC WITH DIFFERENTIAL/PLATELET
Abs Immature Granulocytes: 0.02 10*3/uL (ref 0.00–0.07)
Basophils Absolute: 0 10*3/uL (ref 0.0–0.1)
Basophils Relative: 1 %
Eosinophils Absolute: 0.2 10*3/uL (ref 0.0–0.5)
Eosinophils Relative: 3 %
HCT: 35.9 % — ABNORMAL LOW (ref 39.0–52.0)
Hemoglobin: 11.6 g/dL — ABNORMAL LOW (ref 13.0–17.0)
Immature Granulocytes: 0 %
Lymphocytes Relative: 28 %
Lymphs Abs: 1.6 10*3/uL (ref 0.7–4.0)
MCH: 22.6 pg — ABNORMAL LOW (ref 26.0–34.0)
MCHC: 32.3 g/dL (ref 30.0–36.0)
MCV: 69.8 fL — ABNORMAL LOW (ref 80.0–100.0)
Monocytes Absolute: 0.4 10*3/uL (ref 0.1–1.0)
Monocytes Relative: 7 %
Neutro Abs: 3.6 10*3/uL (ref 1.7–7.7)
Neutrophils Relative %: 61 %
Platelets: 329 10*3/uL (ref 150–400)
RBC: 5.14 MIL/uL (ref 4.22–5.81)
RDW: 19.7 % — ABNORMAL HIGH (ref 11.5–15.5)
WBC: 5.8 10*3/uL (ref 4.0–10.5)
nRBC: 0 % (ref 0.0–0.2)

## 2021-06-30 LAB — URINALYSIS, ROUTINE W REFLEX MICROSCOPIC
Bilirubin Urine: NEGATIVE
Glucose, UA: NEGATIVE mg/dL
Hgb urine dipstick: NEGATIVE
Ketones, ur: NEGATIVE mg/dL
Leukocytes,Ua: NEGATIVE
Nitrite: NEGATIVE
Protein, ur: NEGATIVE mg/dL
Specific Gravity, Urine: 1.015 (ref 1.005–1.030)
pH: 5 (ref 5.0–8.0)

## 2021-06-30 LAB — TROPONIN I (HIGH SENSITIVITY): Troponin I (High Sensitivity): 5 ng/L (ref <18)

## 2021-06-30 MED ORDER — NORTRIPTYLINE HCL 50 MG PO CAPS
100.0000 mg | ORAL_CAPSULE | Freq: Every day | ORAL | 0 refills | Status: DC
  Filled 2021-06-30: qty 30, 15d supply, fill #0

## 2021-06-30 MED ORDER — POTASSIUM CHLORIDE CRYS ER 10 MEQ PO TBCR
30.0000 meq | EXTENDED_RELEASE_TABLET | Freq: Once | ORAL | Status: AC
  Administered 2021-06-30: 30 meq via ORAL
  Filled 2021-06-30: qty 1

## 2021-06-30 NOTE — ED Notes (Signed)
Patient c/o HTN at night, seen multiple times for same recently.  ?

## 2021-06-30 NOTE — Patient Outreach (Signed)
?Medicaid Managed Care   ?Nurse Care Manager Note ? ?06/30/2021 ?Name:  Benjamin [name] MRN:  [id number] DOB:  07/28/1960 ? ?Benjamin [name] is an 61 y.o. year old male who is a primary patient of Arnette FeltsMoore, Janece, FNP.  [facility] was consulted for assistance with:    ?HTN ?PAD with Left AKA ? ?Benjamin [name] was given information about Medicaid Managed Care Coordination team services today. Benjamin [name] Patient agreed to services and verbal consent obtained. ? ?Engaged with patient by telephone for follow up visit in response to provider referral for case management and/or care coordination services.  ? ?Assessments/Interventions:  Review of past medical history, allergies, medications, health status, including review of consultants reports, laboratory and other test data, was performed as part of comprehensive evaluation and provision of chronic care management services. ? ?SDOH (Social Determinants of Health) assessments and interventions performed: ? ? ?Care Plan ? ?Allergies  ?Allergen Reactions  ? Gabapentin Other (See Comments)  ?  Causes lethargy  ? Tetanus Toxoids Other (See Comments)  ?  Tinnitus started after he took this  ? Aspirin Nausea And Vomiting  ? Garlic Other (See Comments)  ?  Sneezing ?  ? ? ?Medications Reviewed Today   ? ? Reviewed by Leane Callavanaugh, Claressa Hughley A, RN (Case Manager) on 06/30/21 at 1452  Med List Status: <None>  ? ?Medication Order Taking? Sig Documenting Provider Last Dose Status Informant  ?acetaminophen (TYLENOL) 325 MG tablet [id number] No Take 1-2 tablets (325-650 mg total) by mouth every 4 (four) hours as needed for mild pain.  ?Patient not taking: Reported on 06/22/2021  ? Jacquelynn CreeLove, Pamela S, PA-C Not Taking Active Self  ?albuterol (VENTOLIN HFA) 108 (90 Base) MCG/ACT inhaler [id number] No Inhale 1-2 puffs into the lungs every 6 (six) hours as needed for wheezing or shortness of breath. Sloan LeiterGray, Samuel A, DO Taking Active Self  ?atorvastatin (LIPITOR) 20 MG tablet  [id number] No Take 1 tablet (20 mg total) by mouth at bedtime. Arnette FeltsMoore, Janece, FNP Taking Active   ?         ?Med Note [facility](Ohanna Gassert A   Wed Jun 30, 2021 10:40 AM) Takes in the AM  ?carvedilol (COREG) 6.25 MG tablet [id number] No Take 1 tablet (6.25 mg total) by mouth 2 (two) times daily with a meal. Trifan, Kermit BaloMatthew J, MD Taking Active Self  ?         ?Med Note [facility](Raisa Ditto A   Wed Jun 30, 2021 10:41 AM) Currently Taking 2 tabs in AM.  Will start taking 1 tab BID as ordered per sister.  ?clopidogrel (PLAVIX) 75 MG tablet [id number] No Take 1 tablet (75 mg total) by mouth daily.  Taking Active Self  ?docusate sodium (COLACE) 100 MG capsule [id number] No Take 1 capsule (100 mg total) by mouth daily.  ?Patient not taking: Reported on 06/22/2021  ? Horton Chinaulkar, Krutika P, MD Not Taking Active Self  ?gabapentin (NEURONTIN) 600 MG tablet [id number] No Take 1 tablet (600 mg total) by mouth 2 (two) times daily. Horton Chinaulkar, Krutika P, MD Taking Active Self  ?         ?Med Note [facility](Meri Pelot A   Wed Jun 30, 2021 10:42 AM) Taking PRN.  ?guaiFENesin-dextromethorphan (ROBITUSSIN DM) 100-10 MG/5ML syrup [id number] No Take 15 mLs by mouth every 4 (four) hours as needed for cough.  ?Patient not taking: Reported on 06/22/2021  ? Lorin Glassahal, Binaya, MD Not Taking Active Self  ?losartan (COZAAR) 50 MG tablet  [id number] No Take 1 tablet (50 mg total) by mouth in the morning and at bedtime. Arnette Felts, FNP Taking Active   ?         ?Med Note [facility], Luverta Korte A   Wed Jun 30, 2021 10:44 AM) Currently taking two 25 mg tabs (50 mg total) once a day.  Will start taking BID per sister.  ?methocarbamol (ROBAXIN) 500 MG tablet [id number] No Take 1 tablet (500 mg total) by mouth every 6 (six) hours as needed for muscle spasms.  ?Patient not taking: Reported on 06/22/2021  ? Jacquelynn Cree, PA-C Not Taking Active Self  ?Multiple Vitamin (MULTIVITAMIN WITH MINERALS) TABS tablet [id number] No Take 1 tablet by mouth daily.  ?Patient not taking:  Reported on 06/22/2021  ? Lorin Glass, MD Not Taking Active Self  ?NON FORMULARY [id number] No 1-5 drops See admin instructions. Unnamed tinnitus relief ear drops- Instill 1-5 drops into the affected ear(s) two times a day as needed for tinnitus [provider] Taking Active Self  ?nortriptyline (PAMELOR) 50 MG capsule [id number] No Take 2 capsules (100 mg total) by mouth at bedtime. Al Decant, PA-C Taking Active   ?         ?Med Note [facility], Riti Rollyson A   Wed Jun 30, 2021 10:46 AM) Questionable if patient will take per sister.  Patient stated he does not like how it makes him feel per sister.  ?senna (SENOKOT) 8.6 MG TABS tablet [id number] No Take 1 tablet (8.6 mg total) by mouth 2 (two) times daily.  ?Patient not taking: Reported on 06/22/2021  ? Jacquelynn Cree, PA-C Not Taking Active Self  ?Vitamins-Lipotropics (LIPO-FLAVONOID PLUS PO) [id number] No Take 1 capsule by mouth 2 (two) times daily as needed (for tinnitus). [provider] Past Week Active Self  ? ?  ?  ? ?  ? ? ?Patient Active Problem List  ? Diagnosis Date Noted  ? Acute blood loss anemia 12/25/2020  ? Hyponatremia 12/25/2020  ? 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 Protein-calorie malnutrition, severe 12/09/2020  ? Wound cellulitis 12/07/2020  ? 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 Ischemia of lower extremity 10/01/2020  ? 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 PAD (peripheral artery disease) (HCC) 11/13/2019  ? Callus of foot 07/01/2019  ? Intermittent claudication (HCC) 01/20/2017  ? Need for influenza vaccination 01/20/2017  ? Needs smoking cessation education 09/06/2016  ? Abnormal echocardiogram 09/05/2016  ? Abnormal EKG 07/25/2016  ? Preoperative cardiovascular examination 07/25/2016  ? Shortness of breath on exertion 07/25/2016  ? Dyslipidemia, goal LDL below 70 07/25/2016  ? High blood  pressure 07/25/2016  ? Abnormal chest x-ray 07/04/2016  ? Peripheral vascular disease (HCC) 07/04/2016  ? Decreased pedal pulses 06/24/2016  ? 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 Heavy smoker 06/24/2016  ? Abnormal weight loss 06/24/2016  ? Foot pain, left 06/24/2016  ? Screening for prostate cancer 06/24/2016  ? ? ?Conditions to be addressed/monitored per PCP order:  HTN and PAD with Left AKA ?Care Plan : RN Care Manger Plan of Care  ?Updates made by Leane Call, RN since 06/30/2021 12:00 AM  ?  ? ?Problem: Chronic Disease Management and Care Coordination Needs for HTN, PAD with status post left AKA   ?Priority: High  ?  ? ?Long-Range Goal: Development of Plan of Care for Chronic Disease Management and Care Coordination Needs (  HTN, PAD with status post left AKA)   ?Start Date: 02/18/2021  ?Expected End Date: 09/19/2021  ?Priority: High  ?Note:   ?Current Barriers:  ?Knowledge Deficits related to plan of care for management of HTN and PAD with status post left AKA on 10/04/2020  ?Care Coordination needs related to Financial constraints related to obtaining medications and rent, Medication procurement, and ADL IADL limitations  ?Chronic Disease Management support and education needs related to HTN and PAD with status post left AKA ?Corporate treasurer.  ?Difficulty obtaining medications ? ?RNCM Clinical Goal(s):  ?RNCM will help patient verbalize understanding of plan for management of HTN and PAD with status post left AKA as evidenced by improved management of these chronic diseases/ ?verbalize basic understanding of HTN and PAD with status post left AKA disease process and self health management plan as evidenced by improved management of these chronic diseases. ?take all medications exactly as prescribed and will call provider for medication related questions as evidenced by being compliant with all medications    ?attend all scheduled medical appointments: 07/28/2021 PCP Visit; 08/16/2021 ENT visit; weekly  Physical Therapy as evidenced by attending all scheduled appointments        ?demonstrate improved adherence to prescribed treatment plan for HTN and PAD with status post left AKA as evidenced by blood pressure re

## 2021-06-30 NOTE — Patient Outreach (Signed)
?Medicaid Managed Care   ?Nurse Care Manager Note ? ?06/30/2021 ?Name:  Benjamin [name] MRN:  [id number] DOB:  September 04, 1960 ? ?Benjamin [name] is an 61 y.o. year old male who is a primary patient of [name], [place].  The [facility] Managed Care Coordination team was consulted for assistance with:    ?HTN ?PAD with Left AKA ? ?Benjamin [name] 's sister, Benjamin [name], was given information about Medicaid Managed Care Coordination team services today. [name] Primary Caregiver agreed to services and verbal consent obtained. ? ?Engaged with patient by telephone for follow up visit in response to provider referral for case management and/or care coordination services.  ? ?Assessments/Interventions:  Review of past medical history, allergies, medications, health status, including review of consultants reports, laboratory and other test data, was performed as part of comprehensive evaluation and provision of chronic care management services. ? ?SDOH (Social Determinants of Health) assessments and interventions performed: ? ? ?Care Plan ? ?Allergies  ?Allergen Reactions  ? Gabapentin Other (See Comments)  ?  Causes lethargy  ? Tetanus Toxoids Other (See Comments)  ?  Tinnitus started after he took this  ? Aspirin Nausea And Vomiting  ? Garlic Other (See Comments)  ?  Sneezing ?  ? ? ?Medications Reviewed Today   ? ? Reviewed by Inge Rise, RN (Case Manager) on 06/30/21 at 3  Med List Status: <None>  ? ?Medication Order Taking? Sig Documenting Provider Last Dose Status Informant  ?acetaminophen (TYLENOL) 325 MG tablet [id number] No Take 1-2 tablets (325-650 mg total) by mouth every 4 (four) hours as needed for mild pain.  ?Patient not taking: Reported on 06/22/2021  ? Bary Leriche, PA-C Not Taking Active Self  ?albuterol (VENTOLIN HFA) 108 (90 Base) MCG/ACT inhaler [id number] Yes Inhale 1-2 puffs into the lungs every 6 (six) hours as needed for wheezing or shortness of breath. Jeanell Sparrow, DO Taking Active Self   ?atorvastatin (LIPITOR) 20 MG tablet [id number] Yes Take 1 tablet (20 mg total) by mouth at bedtime. [name], FNP Taking Active   ?         ?Med Note [facility], Juley Giovanetti A   Wed Jun 30, 2021 10:40 AM) Takes in the AM  ?carvedilol (COREG) 6.25 MG tablet [id number] Yes Take 1 tablet (6.25 mg total) by mouth 2 (two) times daily with a meal. Trifan, Carola Rhine, MD Taking Active Self  ?         ?Med Note [facility], Ahley Bulls A   Wed Jun 30, 2021 10:41 AM) Currently Taking 2 tabs in AM.  Will start taking 1 tab BID as ordered per sister.  ?clopidogrel (PLAVIX) 75 MG tablet [id number] Yes Take 1 tablet (75 mg total) by mouth daily.  Taking Active Self  ?docusate sodium (COLACE) 100 MG capsule [id number] No Take 1 capsule (100 mg total) by mouth daily.  ?Patient not taking: Reported on 06/22/2021  ? Izora Ribas, MD Not Taking Active Self  ?gabapentin (NEURONTIN) 600 MG tablet [id number] Yes Take 1 tablet (600 mg total) by mouth 2 (two) times daily. Izora Ribas, MD Taking Active Self  ?         ?Med Note [facility], Myasia Sinatra A   Wed Jun 30, 2021 10:42 AM) Taking PRN.  ?guaiFENesin-dextromethorphan (ROBITUSSIN DM) 100-10 MG/5ML syrup [id number] No Take 15 mLs by mouth every 4 (four) hours as needed for cough.  ?Patient not taking: Reported on 06/22/2021  ? Terrilee Croak, MD Not Taking Active Self  ?  losartan (COZAAR) 50 MG tablet [id number] Yes Take 1 tablet (50 mg total) by mouth in the morning and at bedtime. [name], FNP Taking Active   ?         ?Med Note [facility], Robbi Spells A   Wed Jun 30, 2021 10:44 AM) Currently taking two 25 mg tabs (50 mg total) once a day.  Will start taking BID per sister.  ?methocarbamol (ROBAXIN) 500 MG tablet [id number] No Take 1 tablet (500 mg total) by mouth every 6 (six) hours as needed for muscle spasms.  ?Patient not taking: Reported on 06/22/2021  ? Bary Leriche, PA-C Not Taking Active Self  ?Multiple Vitamin (MULTIVITAMIN WITH MINERALS) TABS tablet [id number] No Take 1 tablet by  mouth daily.  ?Patient not taking: Reported on 06/22/2021  ? Terrilee Croak, MD Not Taking Active Self  ?NON FORMULARY [id number] Yes 1-5 drops See admin instructions. Unnamed tinnitus relief ear drops- Instill 1-5 drops into the affected ear(s) two times a day as needed for tinnitus [provider] Taking Active Self  ?nortriptyline (PAMELOR) 50 MG capsule [id number] Yes Take 2 capsules (100 mg total) by mouth at bedtime. Azucena Cecil, PA-C Taking Active   ?         ?Med Note [facility], Indiyah Paone A   Wed Jun 30, 2021 10:46 AM) Questionable if patient will take per sister.  Patient stated he does not like how it makes him feel per sister.  ?senna (SENOKOT) 8.6 MG TABS tablet [id number] No Take 1 tablet (8.6 mg total) by mouth 2 (two) times daily.  ?Patient not taking: Reported on 06/22/2021  ? Bary Leriche, PA-C Not Taking Active Self  ?Vitamins-Lipotropics (LIPO-FLAVONOID PLUS PO) [id number]  Take 1 capsule by mouth 2 (two) times daily as needed (for tinnitus). [provider]  Active Self  ? ?  ?  ? ?  ? ? ?Patient Active Problem List  ? Diagnosis Date Noted  ? Acute blood loss anemia 12/25/2020  ? Hyponatremia 12/25/2020  ? 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[place]) 12/25/2020  ? Unilateral AKA, left ([place]) 12/15/2020  ? Protein-calorie malnutrition, severe 12/09/2020  ? Wound cellulitis 12/07/2020  ? 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 Ischemia of lower extremity 10/01/2020  ? 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[place]) 05/08/2020  ? Femoral-popliteal bypass graft occlusion, left ([place]) 11/13/2019  ? PAD (peripheral artery disease) ([place]) 11/13/2019  ? Callus of foot 07/01/2019  ? Intermittent claudication ([place]) 01/20/2017  ? Need for influenza vaccination 01/20/2017  ? Needs smoking cessation education 09/06/2016  ? Abnormal echocardiogram 09/05/2016  ? Abnormal EKG 07/25/2016  ? Preoperative cardiovascular examination 07/25/2016  ? Shortness of breath on exertion 07/25/2016  ? Dyslipidemia, goal LDL below  70 07/25/2016  ? High blood pressure 07/25/2016  ? Abnormal chest x-ray 07/04/2016  ? Peripheral vascular disease ([place]) 07/04/2016  ? Decreased pedal pulses 06/24/2016  ? 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 Heavy smoker 06/24/2016  ? Abnormal weight loss 06/24/2016  ? Foot pain, left 06/24/2016  ? Screening for prostate cancer 06/24/2016  ? ? ?Conditions to be addressed/monitored per PCP order:  HTN and PAD with Left AKA ? ?Care Plan : RN Care Manger Plan of Care  ?Updates made by Inge Rise, RN since 06/30/2021 12:00 AM  ?  ? ?Problem: Chronic Disease Management and Care Coordination Needs for HTN, PAD with status post left AKA   ?Priority: High  ?  ? ?Long-Range Goal: Development of Plan of Care for Chronic Disease  Management and Care Coordination Needs (HTN, PAD with status post left AKA)   ?Start Date: 02/18/2021  ?Expected End Date: 09/19/2021  ?Priority: High  ?Note:   ?Current Barriers:  ?Knowledge Deficits related to plan of care for management of HTN and PAD with status post left AKA on 10/04/2020  ?Care Coordination needs related to Financial constraints related to obtaining medications and rent, Medication procurement, and ADL IADL limitations  ?Chronic Disease Management support and education needs related to HTN and PAD with status post left AKA ?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 ?RNCM Clinical Goal(s):  ?RNCM will help patient verbalize understanding of plan for management of HTN and PAD with status post left AKA as evidenced by improved management of these chronic diseases/ ?verbalize basic understanding of HTN and PAD with status post left AKA disease process and self health management plan as evidenced by improved management of these chronic diseases. ?take all medications exactly as prescribed and will call provider for medication related questions as evidenced by being compliant with all medications    ?attend all scheduled medical appointments: 07/28/2021 PCP Visit;  08/16/2021 ENT visit; weekly Physical Therapy as evidenced by attending all scheduled appointments        ?demonstrate improved adherence to prescribed treatment plan for HTN and PAD with status post left AKA

## 2021-06-30 NOTE — Discharge Instructions (Addendum)
Return to the ER with any new symptoms such as chest pain, shortness of breath, blurred vision, headache ?Please follow-up with your PCP for any ongoing needs concerning your high blood pressure ?Please use the attached form to record your blood pressure at home so that you can keep good track of it ?Please read the attached informational guide concerning high blood pressure in adults ?Please follow the instructions that I provided you today concerning how to take her medication at home ?

## 2021-06-30 NOTE — Patient Instructions (Signed)
Visit Information ? ?[name] was given information about Medicaid Managed Care team care coordination services as a part of their Healthy [facility] Medicaid benefit. Benjamin [name] 's sister, Benjamin [name], verbally consented to engagement with the [facility] Managed Care team.  ? ?If you are experiencing a medical emergency, please call 911 or report to your local emergency department or urgent care.  ? ?If you have a non-emergency medical problem during routine business hours, please contact your provider's office and ask to speak with a nurse.  ? ?For questions related to your Healthy [facility] health plan, please call: [phone] or visit the homepage here: [email or web address] ? ?If you would like to schedule transportation through your Healthy [facility] plan, please call the following number at least 2 days in advance of your appointment: [phone] ? For information about your ride after you set it up, call Ride Assist at [phone]. Use this number to activate a Will Call pickup, or if your transportation is late for a scheduled pickup. Use this number, too, if you need to make a change or cancel a previously scheduled reservation. ? If you need transportation services right away, call [phone]. The after-hours call center is staffed 24 hours to handle ride assistance and urgent reservation requests (including discharges) 365 days a year. Urgent trips include sick visits, hospital discharge requests and life-sustaining treatment. ? ?Call the [facility] at [phone], at any time, 24 hours a day, 7 days a week. If you are in danger or need immediate medical attention call 911. ? ?If you would like help to quit smoking, call 1-800-QUIT-NOW 5742248378) OR Espa?ol: 1-855-D?jelo-Ya 978-447-6152) o para m?s informaci?n haga clic aqu? or Text READY to 200-400 to register via text ? ?[name] - following are the goals we discussed in your  visit today:  Please Patient Goals in the [place] of Care below. ? ?Please see education materials related to today's visit provided as print materials.  ? ?The patient verbalized understanding of instructions, educational materials, and care plan provided today and agreed to receive a mailed copy of patient instructions, educational materials, and care plan.  ? ?The Managed Medicaid care management team will reach out to the patient again over the next 7 days.  ? ?Benjamin Marvel RN, BSN ?Community Care Coordinator ?[place] [facility] ?Mobile: [phone]  ? ?Following is a copy of your plan of care:  ?Care Plan : [place] of Care  ?Updates made by Benjamin Rise, RN since 06/30/2021 12:00 AM  ?  ? ?Problem: Chronic Disease Management and Care Coordination Needs for HTN, PAD with status post left AKA   ?Priority: High  ?  ? ?Long-Range Goal: Development of Plan of Care for Chronic Disease Management and Care Coordination Needs (HTN, PAD with status post left AKA)   ?Start Date: 02/18/2021  ?Expected End Date: 09/19/2021  ?Priority: High  ?Note:   ?Current Barriers:  ?Knowledge Deficits related to plan of care for management of HTN and PAD with status post left AKA on 10/04/2020  ?Care Coordination needs related to Financial constraints related to obtaining medications and rent, Medication procurement, and ADL IADL limitations  ?Chronic Disease Management support and education needs related to HTN and PAD with status post left AKA ?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 ?RNCM Clinical Goal(s):  ?RNCM will help patient verbalize understanding of plan for management of HTN and PAD with status post left AKA as evidenced by  improved management of these chronic diseases/ ?verbalize basic understanding of HTN and PAD with status post left AKA disease process and self health management plan as evidenced by improved management of these chronic  diseases. ?take all medications exactly as prescribed and will call provider for medication related questions as evidenced by being compliant with all medications    ?attend all scheduled medical appointments: 07/28/2021 PCP Visit; 08/16/2021 ENT visit; weekly Physical Therapy as evidenced by attending all scheduled appointments        ?demonstrate improved adherence to prescribed treatment plan for HTN and PAD with status post left AKA as evidenced by blood pressure readings within normal limits and proper healing of left AKA. ?demonstrate improved health management independence as evidenced by blood pressure readings within normal limits and proper healing of left AKA.        ?continue to work with Consulting civil engineer and/or Social Worker to address care management and care coordination needs related to HTN and PAD with status post left AKA as evidenced by adherence to CM Team Scheduled appointments     ?work with pharmacist to address complex medication regimen related to HTN and PAD with status post left AKA as evidenced by review of EMR and patient or pharmacist report    ?work with Education officer, museum to address Lacks knowledge of community resource: for [place] covered under [employer] related to the management of HTN and PAD with status post left AKA as evidenced by review of EMR and patient or Education officer, museum report     through collaboration with Consulting civil engineer, provider, and care team.  ? ?Interventions: ?Inter-disciplinary care team collaboration (see longitudinal plan of care) ?Evaluation of current treatment plan related to  self management and patient's adherence to plan as established by provider ?BSW contacted patient regarding Dental resources. Patient stated he would like dental resources in the area to be mailed to him. Patient stated that everything else was good and no other resources are needed at this time.  ?03/29/21: BSW completed follow up with patient. He stated he did not  receive the letter with the dental resources. Patient stated he would also like eye resources. BSW will send a letter with dental and eye resources. ? ? ?PAD with status post left AKA on 10/04/2020 and revision on 12/08/2020  (Status: Goal on Track (progressing): YES.) Long Term Goal   Not Addressed this Visit ?Evaluation of current treatment plan related to  PAD with status post left AKA , Financial constraints related to obtaining medications (zinc tablets for AKA healing) and rent, Medication procurement, Inability to perform IADL's independently, and Lacks knowledge of community resource: for dental care & vision care services covered ny [employer],  self-management and patient's adherence to plan as established by provider. ?Discussed plans with patient for ongoing care management follow up and provided patient with direct contact information for care management team ?Reviewed medications with patient and discussed importance of medication compliance and benefits of zinc & Vitamin C for healing of left AKA; ?Reviewed scheduled/upcoming provider appointments including : 07/28/2021 PCP Office Visit; 08/16/2021 ENT visit; Weekly Physical Therapy sessions; ?Social Work referral for information regarding dental & vision services covered under [employer] Medicaid ; ?Pharmacy referral for complex medication regimen;  ?Patient received his Left AKA prosthesis.  Patient started Physical Therapy last week and will attend weekly sessions going forward for gait training.  Patient denies any falls and reports good appetite.  This RN Care  Manager continued to encourage patient to pace himself in terms of his activities.  Patient reports he is also working out at a gym to build his upper extremity strength. ? ?Hypertension: (Status: Goal on track: NO.) Long Term Goal  ?Last practice recorded BP readings:  ?BP Readings from Last 3 Encounters:  ?06/30/21 (!) 157/99  ?06/28/21 (!) 144/84  ?06/27/21 (!) 145/91  ?  ?Most  recent eGFR/CrCl:  ?Lab Results  ?Component Value Date  ? EGFR 101 07/22/2020  ?  No components found for: CRCL ? ?Evaluation of current treatment plan related to hypertension self management and patient's adher

## 2021-06-30 NOTE — ED Provider Triage Note (Signed)
Emergency Medicine Provider Triage Evaluation Note ? ?Benjamin [name] , a 61 y.o. male  was evaluated in triage.  Pt complains of elevated blood pressure at night. He reports it was in the 160s at home. He has been seen here multiple times for the same complaints. Denies any chest pain, SOB, or headache.  ? ?Review of Systems  ?Positive:  ?Negative:  ? ?Physical Exam  ?BP (!) 169/101 (BP Location: Right Arm)   Pulse 70   Temp 97.9 ?F (36.6 ?C) (Oral)   Resp 18   SpO2 97%  ?Gen:   Awake, no distress   ?Resp:  Normal effort  ?MSK:   Moves extremities without difficulty  ?Other:  Cranial nerves II-XII intact. Normal speech. Answering questions appropriately.  ? ?Medical Decision Making  ?Medically screening exam initiated at 3:14 AM.  Appropriate orders placed.  Benjamin [name] was informed that the remainder of the evaluation will be completed by another provider, this initial triage assessment does not replace that evaluation, and the importance of remaining in the ED until their evaluation is complete. ? ?Basic labs.  ?  ?Achille Rich, PA-C ?06/30/21 3500 ? ?

## 2021-06-30 NOTE — Patient Instructions (Signed)
Visit Information ? ?Benjamin [name] was given information about Medicaid Managed Care team care coordination services as a part of their Healthy [facility] Medicaid benefit. Benjamin [name] verbally consented to engagement with the [facility] Managed Care team.  ? ?If you are experiencing a medical emergency, please call 911 or report to your local emergency department or urgent care.  ? ?If you have a non-emergency medical problem during routine business hours, please contact your provider's office and ask to speak with a nurse.  ? ?For questions related to your Healthy [facility] health plan, please call: [phone] or visit the homepage here: [email or web address] ? ?If you would like to schedule transportation through your Healthy [facility] plan, please call the following number at least 2 days in advance of your appointment: [phone] ? For information about your ride after you set it up, call Ride Assist at [phone]. Use this number to activate a Will Call pickup, or if your transportation is late for a scheduled pickup. Use this number, too, if you need to make a change or cancel a previously scheduled reservation. ? If you need transportation services right away, call [phone]. The after-hours call center is staffed 24 hours to handle ride assistance and urgent reservation requests (including discharges) 365 days a year. Urgent trips include sick visits, hospital discharge requests and life-sustaining treatment. ? ?Call the [place] at [phone], at any time, 24 hours a day, 7 days a week. If you are in danger or need immediate medical attention call 911. ? ?If you would like help to quit smoking, call 1-800-QUIT-NOW 352-600-0726) OR Espa?ol: 1-855-D?jelo-Ya 757-631-8616) o para m?s informaci?n haga clic aqu? or Text READY to 200-400 to register via text ? ?Benjamin [name] - following are the goals we discussed in your visit today: Please see  Patient Goals in the RN Plan of Care below. ? ?Please see education materials related to today's visit provided as print materials.  ? ?The patient verbalized understanding of instructions, educational materials, and care plan provided today and agreed to receive a mailed copy of patient instructions, educational materials, and care plan.  ? ?The Managed Medicaid care management team will reach out to the patient again over the next 7 days.  ? ?[name] RN, BSN ?Community Care Coordinator ?[facility] ?Mobile: [phone]  ? ?Following is a copy of your plan of care:  ?Care Plan : [place] of Care  ?Updates made by Inge Rise, RN since 06/30/2021 12:00 AM  ?  ? ?Problem: Chronic Disease Management and Care Coordination Needs for HTN, PAD with status post left AKA   ?Priority: High  ?  ? ?Long-Range Goal: Development of Plan of Care for Chronic Disease Management and Care Coordination Needs (HTN, PAD with status post left AKA)   ?Start Date: 02/18/2021  ?Expected End Date: 09/19/2021  ?Priority: High  ?Note:   ?Current Barriers:  ?Knowledge Deficits related to plan of care for management of HTN and PAD with status post left AKA on 10/04/2020  ?Care Coordination needs related to Financial constraints related to obtaining medications and rent, Medication procurement, and ADL IADL limitations  ?Chronic Disease Management support and education needs related to HTN and PAD with status post left AKA ?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 ?RNCM Clinical Goal(s):  ?RNCM will help patient verbalize understanding of plan for management of HTN and PAD with status post left AKA as evidenced by improved management of these chronic diseases/ ?  verbalize basic understanding of HTN and PAD with status post left AKA disease process and self health management plan as evidenced by improved management of these chronic diseases. ?take all medications exactly as  prescribed and will call provider for medication related questions as evidenced by being compliant with all medications    ?attend all scheduled medical appointments: 07/28/2021 PCP Visit; 08/16/2021 ENT visit; weekly Physical Therapy as evidenced by attending all scheduled appointments        ?demonstrate improved adherence to prescribed treatment plan for HTN and PAD with status post left AKA as evidenced by blood pressure readings within normal limits and proper healing of left AKA. ?demonstrate improved health management independence as evidenced by blood pressure readings within normal limits and proper healing of left AKA.        ?continue to work with Consulting civil engineer and/or Social Worker to address care management and care coordination needs related to HTN and PAD with status post left AKA as evidenced by adherence to CM Team Scheduled appointments     ?work with pharmacist to address complex medication regimen related to HTN and PAD with status post left AKA as evidenced by review of EMR and patient or pharmacist report    ?work with Education officer, museum to address Lacks knowledge of community resource: for [place] covered under [employer] related to the management of HTN and PAD with status post left AKA as evidenced by review of EMR and patient or Education officer, museum report     through collaboration with Consulting civil engineer, provider, and care team.  ? ?Interventions: ?Inter-disciplinary care team collaboration (see longitudinal plan of care) ?Evaluation of current treatment plan related to  self management and patient's adherence to plan as established by provider ?BSW contacted patient regarding Dental resources. Patient stated he would like dental resources in the area to be mailed to him. Patient stated that everything else was good and no other resources are needed at this time.  ?03/29/21: BSW completed follow up with patient. He stated he did not receive the letter with the dental resources.  Patient stated he would also like eye resources. BSW will send a letter with dental and eye resources. ? ? ?PAD with status post left AKA on 10/04/2020 and revision on 12/08/2020  (Status: Goal on Track (progressing): YES.) Long Term Goal   Not Addressed this Visit ?Evaluation of current treatment plan related to  PAD with status post left AKA , Financial constraints related to obtaining medications (zinc tablets for AKA healing) and rent, Medication procurement, Inability to perform IADL's independently, and Lacks knowledge of community resource: for dental care & vision care services covered ny [employer],  self-management and patient's adherence to plan as established by provider. ?Discussed plans with patient for ongoing care management follow up and provided patient with direct contact information for care management team ?Reviewed medications with patient and discussed importance of medication compliance and benefits of zinc & Vitamin C for healing of left AKA; ?Reviewed scheduled/upcoming provider appointments including : 07/28/2021 PCP Office Visit; 08/16/2021 ENT visit; Weekly Physical Therapy sessions; ?Social Work referral for information regarding dental & vision services covered under [employer] Medicaid ; ?Pharmacy referral for complex medication regimen;  ?Patient received his Left AKA prosthesis.  Patient started Physical Therapy last week and will attend weekly sessions going forward for gait training.  Patient denies any falls and reports good appetite.  This RN Care Manager continued to encourage patient to  pace himself in terms of his activities.  Patient reports he is also working out at a gym to build his upper extremity strength. ? ?Hypertension: (Status: Goal on track: NO.) Long Term Goal  ?Last practice recorded BP readings:  ?BP Readings from Last 3 Encounters:  ?06/30/21 (!) 157/99  ?06/28/21 (!) 144/84  ?06/27/21 (!) 145/91  ?Most recent eGFR/CrCl:  ?Lab Results  ?Component  Value Date  ? EGFR 101 07/22/2020  ?  No components found for: CRCL ? ?Evaluation of current treatment plan related to hypertension self management and patient's adherence to plan as established by provider;

## 2021-06-30 NOTE — ED Triage Notes (Signed)
Pt complains of elevated blood pressure at night. He reports it was in the 160s at home. He has been seen here multiple times for the same complaints. Denies any chest pain, SOB, or headache.  ?

## 2021-06-30 NOTE — ED Provider Notes (Signed)
?[name] EMERGENCY DEPARTMENT ?Provider Note ? ? ?CSN: [id number] ?Arrival date & time: 06/30/21  0228 ? ?  ? ?History ? ?Chief Complaint  ?Patient presents with  ? Hypertension  ? ? ?Benjamin [name] is a 61 y.o. male with history of hypertension, back pain, frequent ED use.  Patient presents ED for evaluation of hypertension.  The patient has been seen in the ED 12 times in the past 6 months for the same complaint.  Patient also recently seen in urgent care on 5/7 for same complaint, seen by PCP on 5/9 and had blood pressure medication adjusted from 25 mg lisinopril to 50 mg lisinopril at this date on this visit.  Patient states that last night he took medication that he is unable to recall the name of to help him sleep however he states that the medication gave him the "side effect" of keeping him awake.  Patient states that this time he was experiencing ringing in his ears, chest tightness.  Patient reports that these are the typical symptoms of his high blood pressure which made him worried that his blood pressure was too high.  The patient reports that he took his blood pressure at this time and noted to be "elevated" however he is unable to tell me the numbers that were on the reading.  Patient denies any shortness of breath, headache, blurred vision.  Patient denies any recent fevers, nausea, vomiting, back pain.  Patient denies any lightheadedness, dizziness, weakness. ? ? ?Hypertension ?Pertinent negatives include no chest pain, no headaches and no shortness of breath.  ? ?  ? ?Home Medications ?Prior to Admission medications   ?Medication Sig Start Date End Date Taking? Authorizing Provider  ?nortriptyline (PAMELOR) 50 MG capsule Take 2 capsules (100 mg total) by mouth at bedtime. 06/30/21  Yes Al Decant, PA-C  ?acetaminophen (TYLENOL) 325 MG tablet Take 1-2 tablets (325-650 mg total) by mouth every 4 (four) hours as needed for mild pain. ?Patient not taking: Reported on  06/22/2021 12/25/20   Jacquelynn Cree, PA-C  ?albuterol (VENTOLIN HFA) 108 (90 Base) MCG/ACT inhaler Inhale 1-2 puffs into the lungs every 6 (six) hours as needed for wheezing or shortness of breath. 06/12/21   Sloan Leiter, DO  ?atorvastatin (LIPITOR) 20 MG tablet Take 1 tablet (20 mg total) by mouth at bedtime. 06/28/21   Arnette Felts, FNP  ?carvedilol (COREG) 6.25 MG tablet Take 1 tablet (6.25 mg total) by mouth 2 (two) times daily with a meal. 06/01/21 07/31/21  Trifan, Kermit Balo, MD  ?clopidogrel (PLAVIX) 75 MG tablet Take 1 tablet (75 mg total) by mouth daily. 05/11/21     ?docusate sodium (COLACE) 100 MG capsule Take 1 capsule (100 mg total) by mouth daily. ?Patient not taking: Reported on 06/22/2021 03/16/21   Horton Chin, MD  ?gabapentin (NEURONTIN) 600 MG tablet Take 1 tablet (600 mg total) by mouth 2 (two) times daily. 03/22/21   Raulkar, Drema Pry, MD  ?guaiFENesin-dextromethorphan (ROBITUSSIN DM) 100-10 MG/5ML syrup Take 15 mLs by mouth every 4 (four) hours as needed for cough. ?Patient not taking: Reported on 06/22/2021 12/15/20   Lorin Glass, MD  ?losartan (COZAAR) 50 MG tablet Take 1 tablet (50 mg total) by mouth in the morning and at bedtime. 06/28/21   Arnette Felts, FNP  ?methocarbamol (ROBAXIN) 500 MG tablet Take 1 tablet (500 mg total) by mouth every 6 (six) hours as needed for muscle spasms. ?Patient not taking: Reported on 06/22/2021 12/25/20  Jacquelynn CreeLove, Pamela S, PA-C  ?Multiple Vitamin (MULTIVITAMIN WITH MINERALS) TABS tablet Take 1 tablet by mouth daily. ?Patient not taking: Reported on 06/22/2021 12/16/20   Lorin Glassahal, Bin[place], MD  ?NON FORMULARY 1-5 drops See admin instructions. Unnamed tinnitus relief ear drops- Instill 1-5 drops into the affected ear(s) two times a day as needed for tinnitus    [provider]  ?senna (SENOKOT) 8.6 MG TABS tablet Take 1 tablet (8.6 mg total) by mouth 2 (two) times daily. ?Patient not taking: Reported on 06/22/2021 12/25/20   Jacquelynn CreeLove, Pamela S, PA-C  ?Vitamins-Lipotropics  (LIPO-FLAVONOID PLUS PO) Take 1 capsule by mouth 2 (two) times daily as needed (for tinnitus).    [provider]  ?   ? ?Allergies    ?Gabapentin, Tetanus toxoids, Aspirin, and Garlic   ? ?Review of Systems   ?Review of Systems  ?Constitutional:  Negative for fever.  ?HENT:    ?     Tinnitus  ?Eyes:  Negative for visual disturbance.  ?Respiratory:  Positive for chest tightness. Negative for shortness of breath.   ?Cardiovascular:  Negative for chest pain.  ?Gastrointestinal:  Negative for nausea and vomiting.  ?Musculoskeletal:  Negative for back pain.  ?Neurological:  Negative for dizziness, weakness, light-headedness and headaches.  ?All other systems reviewed and are negative. ? ?Physical Exam ?Updated Vital Signs ?BP (!) 157/99   Pulse 63   Temp 98 ?F (36.7 ?C) (Oral)   Resp 17   SpO2 98%  ?Physical Exam ?Vitals and nursing note reviewed.  ?Constitutional:   ?   General: He is not in acute distress. ?   Appearance: Normal appearance. He is not ill-appearing, toxic-appearing or diaphoretic.  ?HENT:  ?   Head: Normocephalic and atraumatic.  ?   Mouth/Throat:  ?   Mouth: Mucous membranes are moist.  ?   Pharynx: Oropharynx is clear.  ?Eyes:  ?   General: Vision grossly intact.  ?   Extraocular Movements: Extraocular movements intact.  ?   Conjunctiva/sclera: Conjunctivae normal.  ?   Visual Fields: Right eye visual fields normal and left eye visual fields normal.  ?Cardiovascular:  ?   Rate and Rhythm: Normal rate and regular rhythm.  ?Pulmonary:  ?   Effort: Pulmonary effort is normal.  ?   Breath sounds: Normal breath sounds. No stridor. No wheezing, rhonchi or rales.  ?Abdominal:  ?   General: Abdomen is flat. Bowel sounds are normal.  ?   Palpations: Abdomen is soft.  ?   Tenderness: There is no abdominal tenderness.  ?Musculoskeletal:  ?   Cervical back: Normal range of motion and neck supple.  ?   Comments: Left-sided AKA  ?   Left Lower Extremity: Left leg is amputated above knee.  ?Skin: ?    General: Skin is warm and dry.  ?   Capillary Refill: Capillary refill takes less than 2 seconds.  ?Neurological:  ?   General: No focal deficit present.  ?   Mental Status: He is alert and oriented to person, place, and time.  ?   GCS: GCS eye subscore is 4. GCS verbal subscore is 5. GCS motor subscore is 6.  ?   Cranial Nerves: Cranial nerves 2-12 are intact. No cranial nerve deficit.  ?   Sensory: Sensation is intact. No sensory deficit.  ?   Motor: Motor function is intact. No weakness.  ?   Coordination: Coordination is intact. [facility] normal.  ? ? ?ED Results / Procedures /  Treatments   ?Labs ?(all labs ordered are listed, but only abnormal results are displayed) ?Labs Reviewed  ?CBC WITH DIFFERENTIAL/PLATELET - Abnormal; Notable for the following components:  ?    Result Value  ? Hemoglobin 11.6 (*)   ? HCT 35.9 (*)   ? MCV 69.8 (*)   ? MCH 22.6 (*)   ? RDW 19.7 (*)   ? All other components within normal limits  ?COMPREHENSIVE METABOLIC PANEL - Abnormal; Notable for the following components:  ? Potassium 3.3 (*)   ? Glucose, Bld 131 (*)   ? Calcium 8.8 (*)   ? Alkaline Phosphatase 150 (*)   ? All other components within normal limits  ?URINALYSIS, ROUTINE W REFLEX MICROSCOPIC  ?TROPONIN I (HIGH SENSITIVITY)  ? ? ?EKG ?None ? ?Radiology ?No results found. ? ?Procedures ?Procedures  ? ? ?Medications Ordered in ED ?Medications  ?potassium chloride (KLOR-CON M) CR tablet 30 mEq (30 mEq Oral Given 06/30/21 0726)  ? ? ?ED Course/ Medical Decision Making/ A&P ?  ?                        ?Medical Decision Making ? ?61 year old male presents to ED for evaluation of high blood pressure.  Please see HPI for further details. ? ?On evaluation, the patient is afebrile, nontachycardic.  Patient lung sounds clear bilaterally, patient nonhypoxic on room air.  Patient abdomen soft compressible all 4 quadrants.  Patient visual fields assessed, found to be intact.  Patient neurological examination shows no focal  neurodeficits.  Patient in no apparent distress. ? ?Patient worked up utilizing the following labs and imaging studies interpreted by me personally: ?- Troponin 5.  Patient denies any chest pain on examination or inter

## 2021-07-02 ENCOUNTER — Ambulatory Visit: Payer: Medicaid Other | Admitting: Occupational Therapy

## 2021-07-02 ENCOUNTER — Encounter: Payer: Self-pay | Admitting: Occupational Therapy

## 2021-07-02 ENCOUNTER — Ambulatory Visit: Payer: Medicaid Other

## 2021-07-02 VITALS — BP 147/78 | HR 77

## 2021-07-02 DIAGNOSIS — R2681 Unsteadiness on feet: Secondary | ICD-10-CM

## 2021-07-02 DIAGNOSIS — R2689 Other abnormalities of gait and mobility: Secondary | ICD-10-CM

## 2021-07-02 DIAGNOSIS — M6281 Muscle weakness (generalized): Secondary | ICD-10-CM

## 2021-07-02 NOTE — Therapy (Signed)
?OUTPATIENT PHYSICAL THERAPY PROSTHETIC TREATMENT NOTE ? ? ?Patient Name: Benjamin [name] ?MRN: [id number] ?DOB:1960-12-28, 61 y.o., male ?Today's Date: 07/02/2021 ? ?PCP: Arnette Felts, FNP ?REFERRING PROVIDER: Arnette Felts, FNP ? ? PT End of Session - 07/02/21 1238   ? ? Visit Number 4   ? Number of Visits 16   ? Date for PT Re-Evaluation 08/03/21   ? Authorization Type [employer] (15 visits)   ? Progress Note Due on Visit 16   ? PT Start Time 1240   ? PT Stop Time 1320   ? PT Time Calculation (min) 40 min   ? Equipment Utilized During Treatment Gait belt   ? Activity Tolerance Patient tolerated treatment well   ? Behavior During Therapy [facility] for tasks assessed/performed   ? ?  ?  ? ?  ? ? ?Past Medical History:  ?Diagnosis Date  ? Back pain   ? Heavy smoker   ? Hyperlipidemia LDL goal <70   ? Hypertension   ? 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  ? Bilateral femoral artery disease  ? ?Past Surgical History:  ?Procedure Laterality Date  ? ABDOMINAL AORTOGRAM W/LOWER EXTREMITY N/A 07/12/2016  ? Procedure: Abdominal Aortogram w/Lower Extremity;  Surgeon: Nada Libman, MD;  [facility]: [facility];  Service: Cardiovascular;  Laterality: N/A;  Bilateral extermity: Patent Renal As. No sig Dz in infrarenal Abd Aorta. Normal Bilat Iliac arteries. R SFA is 100% @ origin - recon in AK-Pop A. R PT A patent. L CFA occluded. L PFA recon @ origin, L SFA occluded w/ recon in AK Pop A.  ? ABDOMINAL AORTOGRAM W/LOWER EXTREMITY N/A 10/15/2019  ? Procedure: ABDOMINAL AORTOGRAM W/LOWER EXTREMITY;  Surgeon: Nada Libman, MD;  [facility]: [facility];  Service: Cardiovascular;  Laterality: N/A;  ? AMPUTATION Left 10/04/2020  ? Procedure: LEFT ABOVE KNEE AMPUTATION;  Surgeon: Nada Libman, MD;  Location: [facility] OR;  Service: Vascular;  Laterality: Left;  ? AMPUTATION Left 12/08/2020  ? Procedure: RIGHT ABOVE KNEE AMPUTATION REVISION;  Surgeon: Chuck Hint, MD;  Location: [facility] OR;  Service:  Vascular;  Laterality: Left;  ? AORTA - BILATERAL FEMORAL ARTERY BYPASS GRAFT N/A 05/08/2020  ? Procedure: AORTA BIFEMORAL BYPASS GRAFT;  Surgeon: Nada Libman, MD;  Location: [facility] OR;  Service: Vascular;  Laterality: N/A;  ? COLONOSCOPY  06/2016  ? never  ? ENDARTERECTOMY FEMORAL Left 11/14/2019  ? Procedure: Left groin exploration, Redo left femoral artery exposure;  Surgeon: Nada Libman, MD;  Location: [facility] OR;  Service: Vascular;  Laterality: Left;  ? FEMORAL-FEMORAL BYPASS GRAFT N/A 11/13/2019  ? Procedure: BYPASS GRAFT FEMORAL-FEMORAL ARTERY RIGHT TO LEFT USING HEMASHIELD GOLD GRAFT 76mm x 30cm;  Surgeon: Nada Libman, MD;  Location: [facility] OR;  Service: Vascular;  Laterality: N/A;  ? FEMORAL-POPLITEAL BYPASS GRAFT Left 11/13/2019  ? Procedure: LEFT FEMORAL BELOW KNEE-POPLITEAL ARTERY USING NON-REVERSED GREATER SAPHENOUS VEIN;  Surgeon: Nada Libman, MD;  [facility]: [facility] OR;  Service: Vascular;  Laterality: Left;  ? FEMORAL-POPLITEAL BYPASS GRAFT Left 05/08/2020  ? Procedure: LEFT REDO BYPASS GRAFT COMMON FEMORAL- BELOW KNEE POPLITEAL ARTERY USING PROPATEN GRAFT;  Surgeon: Nada Libman, MD;  Location: [facility] OR;  Service: Vascular;  Laterality: Left;  ? I & D EXTREMITY Left 09/07/2020  ? Procedure: IRRIGATION AND DEBRIDEMENT LEFT LEG WITH EXCISION OF LEFT LEG DISTAL BYPASS GRAFT;  Surgeon: Cephus Shelling, MD;  Location: [facility] OR;  Service: Vascular;  Laterality: Left;  ?  LOWER EXTREMITY ANGIOGRAM Left 11/14/2019  ? Procedure: LEFT LOWER EXTREMITY ANGIOGRAM, BYPASS GRAFT ANGIOPLASTY;  Surgeon: Nada LibmanBrabham, Vance W, MD;  Location: [facility] OR;  Service: Vascular;  Laterality: Left;  ? PERIPHERAL VASCULAR INTERVENTION  10/15/2019  ? Procedure: PERIPHERAL VASCULAR INTERVENTION;  Surgeon: Nada LibmanBrabham, Vance W, MD;  [facility]: [facility];  Service: Cardiovascular;;  Rt Iliac  ? REMOVAL OF GRAFT Left 10/04/2020  ? Procedure: REMOVAL OF LEFT FEMORAL TO POPITEAL BYPASS GRAFT;  Surgeon: Nada LibmanBrabham, Vance W, MD;  Location:  [facility] OR;  Service: Vascular;  Laterality: Left;  ? ?Patient Active Problem List  ? Diagnosis Date Noted  ? Acute blood loss anemia 12/25/2020  ? Hyponatremia 12/25/2020  ? 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 Protein-calorie malnutrition, severe 12/09/2020  ? Wound cellulitis 12/07/2020  ? 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 Ischemia of lower extremity 10/01/2020  ? 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 PAD (peripheral artery disease) (HCC) 11/13/2019  ? Callus of foot 07/01/2019  ? Intermittent claudication (HCC) 01/20/2017  ? Need for influenza vaccination 01/20/2017  ? Needs smoking cessation education 09/06/2016  ? Abnormal echocardiogram 09/05/2016  ? Abnormal EKG 07/25/2016  ? Preoperative cardiovascular examination 07/25/2016  ? Shortness of breath on exertion 07/25/2016  ? Dyslipidemia, goal LDL below 70 07/25/2016  ? High blood pressure 07/25/2016  ? Abnormal chest x-ray 07/04/2016  ? Peripheral vascular disease (HCC) 07/04/2016  ? Decreased pedal pulses 06/24/2016  ? 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 Heavy smoker 06/24/2016  ? Abnormal weight loss 06/24/2016  ? Foot pain, left 06/24/2016  ? Screening for prostate cancer 06/24/2016  ? ? ?ONSET DATE: 12/08/2020 ?  ?REFERRING DIAG: [id number] (ICD-10-CM) - Unilateral AKA, left (HCC)  ?  ?THERAPY DIAG:  ?Other abnormalities of gait and mobility - Plan: PT plan of care cert/re-cert ?  ?Unsteadiness on feet - Plan: PT plan of care cert/re-cert ?  ?Hx of AKA (above knee amputation), left (HCC) - Plan: PT plan of care cert/re-cert ?  ?SUBJECTIVE:  ?  ?SUBJECTIVE STATEMENT: ?Pt reports he is doing okay. He is wearing his prosthesis for about 4 hours a day for 3-5 days a week. ? ?PERTINENT HISTORY: PMH significant for HTN, HLD, heavy smoking, peripheral artery disease s/p bilateral femoral artery bypass graft  ? ? ?PAIN:  ?Are you  having pain? No (not pain just ringing in L ear rates it at 9/10) Has ENT appt in June.  ? ? ?    ? ? PATIENT EDUCATION: ?Education details: Skin check, Correct ply sock adjustment, Propper donning, Propper doffing, Proper wear schedule/adjustment, and Proper weight-bearing schedule/adjustment ?Person educated: Patient ?Education method: Explanation, Demonstration, and Verbal cues ?Education comprehension: verbalized understanding, returned demonstration, verbal cues required, and needs further education ? ?TODAY'S TREATMENT:  ?  ?TREATMENT:  ?BP: 147/78 ?Gait training: 2 z [id number]' with RW, PT pushing the walker from behind for continuous walker glide and to promote longer R step length. Used laser guidance for visual feedback to improve symmetrical step length, pt requires 90% verbal cues to take longer R step length to improve locking of L prosthetics. ? ?In the beginning of session asked pt to take pants off to put on prosthesis and gave patient paper shorts to wear.  ? ?Got patient's other walker from car and replaced wheels from that walker to current walker and placed new tennis balls in back  for patient to use. ?  ?  ?PATIENT EDUCATION: ?Education details: see above ?Person educated: Patient ?Education method: Explanation ?Education comprehension: verbalized understanding ?  ?  ?HOME EXERCISE PROGRAM: ?Standing lateral weight shifts ?  ?ASSESSMENT: ?  ?CLINICAL IMPRESSION: ?Pt continues to require increaed VC for proper step length, pt able to don and doff prosthetics correctly. Pts wear time is still 4 hours. Pt needs further education on gait training. ?  ?  ?OBJECTIVE IMPAIRMENTS Abnormal gait, cardiopulmonary status limiting activity, decreased activity tolerance, decreased balance, decreased endurance, decreased mobility, difficulty walking, decreased ROM, decreased strength, decreased safety awareness, hypomobility, increased fascial restrictions, impaired flexibility, improper body mechanics, postural  dysfunction, prosthetic dependency , and pain.  ?  ?ACTIVITY LIMITATIONS cleaning, community activity, laundry, and yard work.  ?  ?PERSONAL FACTORS Past/current experiences, Time since onset of injury/ill

## 2021-07-02 NOTE — Therapy (Signed)
?OUTPATIENT OCCUPATIONAL THERAPY TREATMENT NOTE ? ? ?Patient Name: Benjamin [name] ?MRN: [id number] ?DOB:07-Mar-1960, 61 y.o., male ?Today's Date: 07/02/2021 ? ?PCP: Arnette Felts, FNP ?REFERRING PROVIDER: Horton Chin, MD  ? ?END OF SESSION:  ? OT End of Session - 07/02/21 1315   ? ? Visit Number 4   ? Number of Visits 17   ? Date for OT Re-Evaluation 08/03/21   ? Authorization Type [place] [employer] - awaiting approval   ? OT Start Time 1315   ? OT Stop Time 1400   ? OT Time Calculation (min) 45 min   ? Activity Tolerance Patient tolerated treatment well   ? Behavior During Therapy [facility] for tasks assessed/performed   ? ?  ?  ? ?  ? ? ?Past Medical History:  ?Diagnosis Date  ? Back pain   ? Heavy smoker   ? Hyperlipidemia LDL goal <70   ? Hypertension   ? 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  ? Bilateral femoral artery disease  ? ?Past Surgical History:  ?Procedure Laterality Date  ? ABDOMINAL AORTOGRAM W/LOWER EXTREMITY N/A 07/12/2016  ? Procedure: Abdominal Aortogram w/Lower Extremity;  Surgeon: Nada Libman, MD;  [facility];  Service: Cardiovascular;  Laterality: N/A;  Bilateral extermity: Patent Renal As. No sig Dz in infrarenal Abd Aorta. Normal Bilat Iliac arteries. R SFA is 100% @ origin - recon in AK-Pop A. R PT A patent. L CFA occluded. L PFA recon @ origin, L SFA occluded w/ recon in AK Pop A.  ? ABDOMINAL AORTOGRAM W/LOWER EXTREMITY N/A 10/15/2019  ? Procedure: ABDOMINAL AORTOGRAM W/LOWER EXTREMITY;  Surgeon: Nada Libman, MD;  [facility];  Service: Cardiovascular;  Laterality: N/A;  ? AMPUTATION Left 10/04/2020  ? Procedure: LEFT ABOVE KNEE AMPUTATION;  Surgeon: Nada Libman, MD;  Location: [facility] OR;  Service: Vascular;  Laterality: Left;  ? AMPUTATION Left 12/08/2020  ? Procedure: RIGHT ABOVE KNEE AMPUTATION REVISION;  Surgeon: Chuck Hint, MD;  Location: [facility] OR;  Service: Vascular;  Laterality: Left;  ? AORTA - BILATERAL FEMORAL  ARTERY BYPASS GRAFT N/A 05/08/2020  ? Procedure: AORTA BIFEMORAL BYPASS GRAFT;  Surgeon: Nada Libman, MD;  Location: [facility] OR;  Service: Vascular;  Laterality: N/A;  ? COLONOSCOPY  06/2016  ? never  ? ENDARTERECTOMY FEMORAL Left 11/14/2019  ? Procedure: Left groin exploration, Redo left femoral artery exposure;  Surgeon: Nada Libman, MD;  [facility];  Service: Vascular;  Laterality: Left;  ? FEMORAL-FEMORAL BYPASS GRAFT N/A 11/13/2019  ? Procedure: BYPASS GRAFT FEMORAL-FEMORAL ARTERY RIGHT TO LEFT USING HEMASHIELD GOLD GRAFT 65mm x 30cm;  Surgeon: Nada Libman, MD;  Location: [facility] OR;  Service: Vascular;  Laterality: N/A;  ? FEMORAL-POPLITEAL BYPASS GRAFT Left 11/13/2019  ? Procedure: LEFT FEMORAL BELOW KNEE-POPLITEAL ARTERY USING NON-REVERSED GREATER SAPHENOUS VEIN;  Surgeon: Nada Libman, MD;  Location[facility] OR;  Service: Vascular;  Laterality: Left;  ? FEMORAL-POPLITEAL BYPASS GRAFT Left 05/08/2020  ? Procedure: LEFT REDO BYPASS GRAFT COMMON FEMORAL- BELOW KNEE POPLITEAL ARTERY USING PROPATEN GRAFT;  Surgeon: Nada Libman, MD;  Location: [facility] OR;  Service: Vascular;  Laterality: Left;  ? I & D EXTREMITY Left 09/07/2020  ? Procedure: IRRIGATION AND DEBRIDEMENT LEFT LEG WITH EXCISION OF LEFT LEG DISTAL BYPASS GRAFT;  Surgeon: Cephus Shelling, MD;  Location: [facility] OR;  Service: Vascular;  Laterality: Left;  ? LOWER EXTREMITY ANGIOGRAM Left 11/14/2019  ? Procedure: LEFT LOWER EXTREMITY  ANGIOGRAM, BYPASS GRAFT ANGIOPLASTY;  Surgeon: Nada LibmanBrabham, Vance W, MD;  [facility];  Service: Vascular;  Laterality: Left;  ? PERIPHERAL VASCULAR INTERVENTION  10/15/2019  ? Procedure: PERIPHERAL VASCULAR INTERVENTION;  Surgeon: Nada LibmanBrabham, Vance W, MD;  [facility];  Service: Cardiovascular;;  Rt Iliac  ? REMOVAL OF GRAFT Left 10/04/2020  ? Procedure: REMOVAL OF LEFT FEMORAL TO POPITEAL BYPASS GRAFT;  Surgeon: Nada LibmanBrabham, Vance W, MD;  Location: [facility] OR;  Service: Vascular;  Laterality: Left;  ? ?Patient  Active Problem List  ? Diagnosis Date Noted  ? Acute blood loss anemia 12/25/2020  ? Hyponatremia 12/25/2020  ? 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 Protein-calorie malnutrition, severe 12/09/2020  ? Wound cellulitis 12/07/2020  ? 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 Ischemia of lower extremity 10/01/2020  ? 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 PAD (peripheral artery disease) (HCC) 11/13/2019  ? Callus of foot 07/01/2019  ? Intermittent claudication (HCC) 01/20/2017  ? Need for influenza vaccination 01/20/2017  ? Needs smoking cessation education 09/06/2016  ? Abnormal echocardiogram 09/05/2016  ? Abnormal EKG 07/25/2016  ? Preoperative cardiovascular examination 07/25/2016  ? Shortness of breath on exertion 07/25/2016  ? Dyslipidemia, goal LDL below 70 07/25/2016  ? High blood pressure 07/25/2016  ? Abnormal chest x-ray 07/04/2016  ? Peripheral vascular disease (HCC) 07/04/2016  ? Decreased pedal pulses 06/24/2016  ? 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 Heavy smoker 06/24/2016  ? Abnormal weight loss 06/24/2016  ? Foot pain, left 06/24/2016  ? Screening for prostate cancer 06/24/2016  ? ? ?ONSET DATE: 12/08/20 (Lt AKA revision) ? ?REFERRING DIAG: Z61.096E: S78.112A (ICD-10-CM) - Unilateral AKA, left (HCC)  ? ?THERAPY DIAG:  ?Unsteadiness on feet ? ?Other abnormalities of gait and mobility ? ?Muscle weakness (generalized) ? ? ?PERTINENT HISTORY: necrotic left above-knee amputation site. 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 PMH includes PAD, HLD, HTN, abnormal glucose.  ? ?PRECAUTIONS: **HTN, fall risk, Lt AKA w/ prosthestic ? ?SUBJECTIVE: I got the prosthesis cut down more yesterday at [employer]. My blood pressure was a little high. I've practiced putting on my prosthesis and pants 3 times since I last saw you ? ?PAIN:  ?Are you having pain? No but reports mild pressure in chest.  ? ? ? ? ?OBJECTIVE:   ? ?TODAY'S TREATMENT: ? ?BP: 147/78, HR = 77 ? ?Pt reports only difficulty with foot placement with prosthesis.  ? ?ADLs pt reports only difficulty with ADLs right now is with doffing prosthesis at night. Pt reports sister lives with him and he reports she is completing currently. Pt reporting wearing prosthesis about 2 hours a day. ? ?Standing Tolerance with completing small peg board design with LUE and reaching to left to obtain pegs and place into board. Pt copied pattern with 80% accuracy and with standing with unilateral UE support. Pt stood for 10 min 51 sec consistently before requiring a seated rest break. ? ?Strengthening with green theraband with seated exercises, bicep curls, rows, shoulder flexion, tricep curls ? ?Arm Bike: for conditioning and reciprocal movements on Level 3 for 6 minutes (forward and backward)  ? ?OT assisted patient back to his car in the parking lot. Pt able to ambulate to lobby with close supervision, needed seated rest break and then ambulated to car from lobby. Pt ambulated with rolling walker and close supervision. Pt asked therapist to put walker into back seat but  reported he was able to complete this himself. ? ?HOME EXERCISE PROGRAM/ PATIENT EDUCATION: ?06/16/21:  sequencing for LE dressing w/ prosthesis, tub transfer w/ bench, and A/E and DME recommendations ? 06/23/21: BUE strengthening HEP for posterior sh girdle ? ?GOALS: ?Goals reviewed with patient? Yes ?  ?SHORT TERM GOALS: Target date: 07/06/2021 ?  ?Independent with UB strengthening HEP  ?Baseline: DEPENDENT ?Goal status: ONGOING ?  ?2.  Pt will verbalize understanding of DME and A/E to increase safety and independence with ADLS ?Baseline: DEPENDENT ?Goal status: ONGOING ?  ?3.  Pt will perform LE dressing w/ prosthesis on I'ly w/ AE prn ?Baseline: dependent with prosthesis ?Goal status: ONGOING ?  ?LONG TERM GOALS: Target date: 08/03/2021 ?  ?Pt to perform dynamic standing tasks/IADLS using prosthesis (washing dishes,  folding clothes, light cleaning) ?Baseline: DEPENDENT ?Goal status: INITIAL ?  ?2.  Pt to wear prosthesis at least 50% of the time during the day for ADLS/IADLS ?Baseline: Not currently wearing ?Goal status:

## 2021-07-03 ENCOUNTER — Emergency Department (HOSPITAL_COMMUNITY): Payer: Medicaid Other

## 2021-07-03 ENCOUNTER — Other Ambulatory Visit: Payer: Self-pay

## 2021-07-03 ENCOUNTER — Emergency Department (HOSPITAL_COMMUNITY)
Admission: EM | Admit: 2021-07-03 | Discharge: 2021-07-03 | Disposition: A | Discharge status: Home / Self Care | Payer: Medicaid Other | Attending: Emergency Medicine | Admitting: Emergency Medicine

## 2021-07-03 DIAGNOSIS — I10 Essential (primary) hypertension: Secondary | ICD-10-CM | POA: Insufficient documentation

## 2021-07-03 DIAGNOSIS — Z5321 Procedure and treatment not carried out due to patient leaving prior to being seen by health care provider: Secondary | ICD-10-CM | POA: Diagnosis not present

## 2021-07-03 DIAGNOSIS — R079 Chest pain, unspecified: Secondary | ICD-10-CM | POA: Diagnosis present

## 2021-07-03 LAB — BASIC METABOLIC PANEL
Anion gap: 7 (ref 5–15)
BUN: 13 mg/dL (ref 8–23)
CO2: 22 mmol/L (ref 22–32)
Calcium: 8.8 mg/dL — ABNORMAL LOW (ref 8.9–10.3)
Chloride: 109 mmol/L (ref 98–111)
Creatinine, Ser: 1.03 mg/dL (ref 0.61–1.24)
GFR, Estimated: >60 mL/min (ref >60)
Glucose, Bld: 123 mg/dL — ABNORMAL HIGH (ref 70–99)
Potassium: 3.4 mmol/L — ABNORMAL LOW (ref 3.5–5.1)
Sodium: 138 mmol/L (ref 135–145)

## 2021-07-03 LAB — CBC
HCT: 35 % — ABNORMAL LOW (ref 39.0–52.0)
Hemoglobin: 11.5 g/dL — ABNORMAL LOW (ref 13.0–17.0)
MCH: 22.6 pg — ABNORMAL LOW (ref 26.0–34.0)
MCHC: 32.9 g/dL (ref 30.0–36.0)
MCV: 68.8 fL — ABNORMAL LOW (ref 80.0–100.0)
Platelets: 354 10*3/uL (ref 150–400)
RBC: 5.09 MIL/uL (ref 4.22–5.81)
RDW: 19.4 % — ABNORMAL HIGH (ref 11.5–15.5)
WBC: 5.7 10*3/uL (ref 4.0–10.5)
nRBC: 0 % (ref 0.0–0.2)

## 2021-07-03 LAB — TROPONIN I (HIGH SENSITIVITY)
Troponin I (High Sensitivity): 6 ng/L (ref <18)
Troponin I (High Sensitivity): 7 ng/L (ref <18)

## 2021-07-03 NOTE — ED Notes (Signed)
Pt is not answering  

## 2021-07-03 NOTE — ED Provider Triage Note (Signed)
?  Emergency Medicine Provider Triage Evaluation Note ? ?MRN:  [id number]  ?Arrival date & time: 07/03/21    ?Medically screening exam initiated at 1:46 AM.   ?CC:   ?No chief complaint on file. ?  ?HPI:  ?Benjamin [name] is a 61 y.o. year-old male presents to the ED with chief complaint of chest pain and high BP.  Has hx of the same.  States that he has had recent changes to his BP meds.  States that he is having CP now. ? ?History provided by patient ?ROS:  ?-As included in HPI ?PE:  ?There were no vitals filed for this visit.  ?Non-toxic appearing ?No respiratory distress ? ?MDM:  ? ?I've ordered labs in triage to expedite lab/diagnostic workup. ? ?Patient was informed that the remainder of the evaluation will be completed by another provider, this initial triage assessment does not replace that evaluation, and the importance of remaining in the ED until their evaluation is complete. ? ?  ?Roxy Horseman, PA-C ?07/03/21 0148 ? ?

## 2021-07-03 NOTE — ED Triage Notes (Signed)
Pt arrives a&ox 4 with c/o HTN after taking new medication Trazodone to help him sleep; Pt also c/o chest tightness; Pt states pain at 8/10; No obvious signs of distress and patient talking in full and complete sentences;-Monique,RN  ?

## 2021-07-03 NOTE — ED Notes (Signed)
Pt not answering when name was called to be roomed ?

## 2021-07-05 ENCOUNTER — Other Ambulatory Visit: Payer: Self-pay

## 2021-07-05 ENCOUNTER — Encounter: Payer: Self-pay | Admitting: Physical Medicine and Rehabilitation

## 2021-07-05 ENCOUNTER — Encounter
Payer: Medicaid Other | Attending: Physical Medicine and Rehabilitation | Admitting: Physical Medicine and Rehabilitation

## 2021-07-05 VITALS — BP 165/92 | HR 81 | Ht 68.0 in | Wt 159.2 lb

## 2021-07-05 DIAGNOSIS — Z89619 Acquired absence of unspecified leg above knee: Secondary | ICD-10-CM

## 2021-07-05 DIAGNOSIS — I10 Essential (primary) hypertension: Secondary | ICD-10-CM

## 2021-07-05 MED ORDER — CLONIDINE 0.1 MG/24HR TD PTWK
0.1000 mg | MEDICATED_PATCH | TRANSDERMAL | 12 refills | Status: AC
  Filled 2021-07-05: qty 4, 28d supply, fill #0
  Filled 2021-08-02: qty 4, 28d supply, fill #1
  Filled 2021-11-22 – 2021-11-23 (×2): qty 4, 28d supply, fill #2

## 2021-07-05 MED ORDER — LOSARTAN POTASSIUM 50 MG PO TABS
50.0000 mg | ORAL_TABLET | Freq: Two times a day (BID) | ORAL | 1 refills | Status: AC
  Filled 2021-07-05 – 2022-01-26 (×3): qty 180, 90d supply, fill #0

## 2021-07-05 MED ORDER — CARVEDILOL 6.25 MG PO TABS
6.2500 mg | ORAL_TABLET | Freq: Two times a day (BID) | ORAL | 0 refills | Status: DC
  Filled 2021-07-05: qty 120, 60d supply, fill #0

## 2021-07-05 NOTE — Patient Instructions (Addendum)
1) citrus foods- high in vitamins and minerals ?2) salmon and other fatty fish - reduces inflammation and oxylipins ?3) swiss chard (leafy green)- high level of nitrates ?4) pumpkin seeds- one of the best natural sources of magnesium ?5) Beans and lentils- high in fiber, magnesium, and potassium ?6) Berries- high in flavonoids ?7) Amaranth (whole grain, can be cooked similarly to rice and oats)- high in magnesium and fiber ?8) Pistachios- even more effective at reducing BP than other nuts ?9) Carrots- high in phenolic compounds that relax blood vessels and reduce inflammation ?10) Celery- contain phthalides that relax tissues of arterial walls ?11) Tomatoes- can also improve cholesterol and reduce risk of heart disease ?12) Broccoli- good source of magnesium, calcium, and potassium ?13) Greek yogurt: high in potassium and calcium ?14) Herbs and spices: Celery seed, cilantro, saffron, lemongrass, black cumin, ginseng, cinnamon, cardamom, sweet basil, and ginger ?15) Chia and flax seeds- also help to lower cholesterol and blood sugar ?16) Beets- high levels of nitrates that relax blood vessels  ?17) spinach and bananas- high in potassium ? ?-Provided lise of supplements that can help with hypertension:  ?1) magnesium: one high quality brand is Bioptemizers since it contains all 7 types of magnesium, otherwise over the counter magnesium gluconate 400mg  is a good option ?2) B vitamins ?3) vitamin D ?4) potassium ?5) CoQ10 ?6) L-arginine ?7) Vitamin C ?8) Beetroot ? ? ?[employer], [employer] Roots, Sprouts- grocery stores that may carry tart cherry juice ?-Educated that goal BP is 120/80. ?-Made goal to incorporate some of the above foods into diet.   ?

## 2021-07-05 NOTE — Progress Notes (Signed)
? ?Subjective:  ? ? Patient ID: Benjamin [name], male    DOB: [birth year], 61 y.o.   MRN: [id number] ? ?HPI ?Benjamin [name] is a 61 year old man who presents for follow-up of Left AKA and HTN. ? ?1) Left AKA ?-he has had surgical follow-up and has repeat follow-up next month.  ?-he is eager to get his prosthesis so he can improve his mobility  ?-he is eager to begin PT and OT again to regain his strength ?-he is able to transfer to and from his bathroom on his right leg as wheelchair does not fit inside bathroom door ?-he is living with his sister ? ?2) Left phantom limb pain ?-worst at night ?-gabapentin helps ?-he asks whether dose may be increased ?-currently takes 400mg  BID ? ?3) Tinnitus:  ?-happens at night.  ?-no decrease in hearing.  ? ?4) HTN: ?-he used to check his blood pressure at home but lost his machine.  ?-he needs refills on Carvedilol and Cozaar ?-he needs new script for clonidine patch ? ? ? ? ?Pain Inventory ?Average Pain 7 ?Pain Right Now 1 ?My pain is tingling ? ?In the last 24 hours, has pain interfered with the following? ?General activity 1 ?Relation with others 0 ?Enjoyment of life 1 ?What TIME of day is your pain at its worst? night ?Sleep (in general) Fair ? ?Pain is worse with: sitting ?Pain improves with: medication ?Relief from Meds: 8 ? ?use a walker ?how many minutes can you walk? 30 ?ability to climb steps?  yes ?do you drive?  no ?use a wheelchair ?transfers alone ? ?disabled: date disabled . ?I need assistance with the following:  meal prep ? ?numbness ?tingling ?trouble walking ?spasms ?anxiety ? ?Hospital f/u ? ?Hospital f/u ? ? ? ?Family History  ?Problem Relation Age of Onset  ? Arthritis Mother   ? COPD Father   ? Stroke Father   ? Multiple sclerosis Sister   ? ?Social History  ? ?Socioeconomic History  ? Marital status: Single  ?  Spouse name: Not on file  ? Number of children: Not on file  ? Years of education: 67  ? Highest education level: Not on file  ?Occupational History  ? Not  on file  ?Tobacco Use  ? Smoking status: Former  ?  Packs/day: 1.50  ?  Years: 29.00  ?  Pack years: 43.50  ?  Types: Cigarettes  ?  Quit date: 10/22/2020  ?  Years since quitting: 0.7  ? Smokeless tobacco: Never  ? Tobacco comments:  ?  Quit smoking September 2022.  Using Nicoderm patch currently.  ?Vaping Use  ? Vaping Use: Never used  ?Substance and Sexual Activity  ? Alcohol use: No  ? Drug use: No  ? Sexual activity: Not Currently  ?Other Topics Concern  ? Not on file  ?Social History Narrative  ? He works as a October 2022 at [employer].  ? He lives with his sister and her son.  ? Walks every day to work at least 15+ minutes.  ? ?Social Determinants of Health  ? ?Financial Resource Strain: Medium Risk  ? Difficulty of Paying Living Expenses: Somewhat hard  ?Food Insecurity: No Food Insecurity  ? Worried About [employer] in the Last Year: Never true  ? Ran Out of Food in the Last Year: Never true  ?Transportation Needs: No Transportation Needs  ? Lack of Transportation (Medical): No  ? Lack of Transportation (Non-Medical): No  ?Physical Activity:  Inactive  ? Days of Exercise per Week: 0 days  ? Minutes of Exercise per Session: 0 min  ?Stress: Stress Concern Present  ? Feeling of Stress : To some extent  ?Social Connections: Unknown  ? Frequency of Communication with Friends and Family: More than three times a week  ? Frequency of Social Gatherings with Friends and Family: More than three times a week  ? Attends Religious Services: Not on file  ? Active Member of Clubs or Organizations: Not on file  ? Attends BankerClub or Organization Meetings: Not on file  ? Marital Status: Never married  ? ?Past Surgical History:  ?Procedure Laterality Date  ? ABDOMINAL AORTOGRAM W/LOWER EXTREMITY N/A 07/12/2016  ? Procedure: Abdominal Aortogram w/Lower Extremity;  Surgeon: Nada LibmanBrabham, Vance W, MD;  [facility];  Service: Cardiovascular;  Laterality: N/A;  Bilateral extermity: Patent Renal As. No sig Dz  in infrarenal Abd Aorta. Normal Bilat Iliac arteries. R SFA is 100% @ origin - recon in AK-Pop A. R PT A patent. L CFA occluded. L PFA recon @ origin, L SFA occluded w/ recon in AK Pop A.  ? ABDOMINAL AORTOGRAM W/LOWER EXTREMITY N/A 10/15/2019  ? Procedure: ABDOMINAL AORTOGRAM W/LOWER EXTREMITY;  Surgeon: Nada LibmanBrabham, Vance W, MD;  [facility];  Service: Cardiovascular;  Laterality: N/A;  ? AMPUTATION Left 10/04/2020  ? Procedure: LEFT ABOVE KNEE AMPUTATION;  Surgeon: Nada LibmanBrabham, Vance W, MD;  [facility];  Service: Vascular;  Laterality: Left;  ? AMPUTATION Left 12/08/2020  ? Procedure: RIGHT ABOVE KNEE AMPUTATION REVISION;  Surgeon: Chuck Hintickson, Christopher S, MD;  [facility];  Service: Vascular;  Laterality: Left;  ? AORTA - BILATERAL FEMORAL ARTERY BYPASS GRAFT N/A 05/08/2020  ? Procedure: AORTA BIFEMORAL BYPASS GRAFT;  Surgeon: Nada LibmanBrabham, Vance W, MD;  [facility];  Service: Vascular;  Laterality: N/A;  ? COLONOSCOPY  06/2016  ? never  ? ENDARTERECTOMY FEMORAL Left 11/14/2019  ? Procedure: Left groin exploration, Redo left femoral artery exposure;  Surgeon: Nada LibmanBrabham, Vance W, MD;  [facility];  Service: Vascular;  Laterality: Left;  ? FEMORAL-FEMORAL BYPASS GRAFT N/A 11/13/2019  ? Procedure: BYPASS GRAFT FEMORAL-FEMORAL ARTERY RIGHT TO LEFT USING HEMASHIELD GOLD GRAFT 8mm x 30cm;  Surgeon: Nada LibmanBrabham, Vance W, MD;  [facility];  Service: Vascular;  Laterality: N/A;  ? FEMORAL-POPLITEAL BYPASS GRAFT Left 11/13/2019  ? Procedure: LEFT FEMORAL BELOW KNEE-POPLITEAL ARTERY USING NON-REVERSED GREATER SAPHENOUS VEIN;  Surgeon: Nada LibmanBrabham, Vance W, MD;  [facility];  Service: Vascular;  Laterality: Left;  ? FEMORAL-POPLITEAL BYPASS GRAFT Left 05/08/2020  ? Procedure: LEFT REDO BYPASS GRAFT COMMON FEMORAL- BELOW KNEE POPLITEAL ARTERY USING PROPATEN GRAFT;  Surgeon: Nada LibmanBrabham, Vance W, MD;  [facility];  Service: Vascular;  Laterality: Left;  ? I & D EXTREMITY Left 09/07/2020  ? Procedure:  IRRIGATION AND DEBRIDEMENT LEFT LEG WITH EXCISION OF LEFT LEG DISTAL BYPASS GRAFT;  Surgeon: Cephus Shellinglark, Christopher J, MD;  [facility];  Service: Vascular;  Laterality: Left;  ? LOWER EXTREMITY ANGIOGRAM Left 11/14/2019  ? Procedure: LEFT LOWER EXTREMITY ANGIOGRAM, BYPASS GRAFT ANGIOPLASTY;  Surgeon: Nada LibmanBrabham, Vance W, MD;  [facility];  Service: Vascular;  Laterality: Left;  ? PERIPHERAL VASCULAR INTERVENTION  10/15/2019  ? Procedure: PERIPHERAL VASCULAR INTERVENTION;  Surgeon: Nada LibmanBrabham, Vance W, MD;  [facility];  Service: Cardiovascular;;  Rt Iliac  ? REMOVAL OF GRAFT Left 10/04/2020  ? Procedure: REMOVAL OF LEFT FEMORAL TO POPITEAL BYPASS GRAFT;  Surgeon:  Nada Libman, MD;  Location: [facility] OR;  Service: Vascular;  Laterality: Left;  ? ?Past Medical History:  ?Diagnosis Date  ? Back pain   ? Heavy smoker   ? Hyperlipidemia LDL goal <70   ? Hypertension   ? 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  ? Bilateral femoral artery disease  ? ?Ht 5\' 8"  (1.727 m)   Wt 159 lb 3.2 oz (72.2 kg)   BMI 24.21 kg/m?  ? ?Opioid Risk Score:   ?Fall Risk Score:  `1 ? ?Depression screen PHQ 2/9 ? ? ?  03/16/2021  ?  2:06 PM 02/18/2021  ? 10:10 AM 07/22/2020  ? 10:30 AM 07/01/2019  ?  9:08 AM 05/15/2019  ?  9:35 AM 01/20/2017  ?  8:37 AM  ?Depression screen PHQ 2/9  ?Decreased Interest 2 1 0 1 1 0  ?Down, Depressed, Hopeless 0 1 0 0 1 0  ?PHQ - 2 Score 2 2 0 1 2 0  ?Altered sleeping 1   2 3    ?Tired, decreased energy 1   1 1    ?Change in appetite 0   0 2   ?Feeling bad or failure about yourself  1   0 2   ?Trouble concentrating 0   0 0   ?Moving slowly or fidgety/restless 0   0 0   ?Suicidal thoughts 0   0 0   ?PHQ-9 Score 5   4 10    ?Difficult doing work/chores Somewhat difficult   Not difficult at all Extremely dIfficult   ?  ? ?Review of Systems  ?Musculoskeletal:   ?     Spasms  ?Neurological:  Positive for numbness.  ?Psychiatric/Behavioral:  The patient is nervous/anxious.   ?All other systems  reviewed and are negative. ? ?   ?Objective:  ? Physical Exam ?Gen: no distress, normal appearing ?HEENT: oral mucosa pink and moist, NCAT ?Cardio: Reg rate ?Chest: normal effort, normal rate of breathing ?Abd: soft

## 2021-07-06 ENCOUNTER — Other Ambulatory Visit: Payer: Self-pay | Admitting: Pharmacist

## 2021-07-06 ENCOUNTER — Other Ambulatory Visit: Payer: Self-pay

## 2021-07-06 NOTE — Chronic Care Management (AMB) (Signed)
Patient seen by Zella Ball, PharmD Candidate on 07/06/21 while they were picking up prescriptions at [facility] at [facility].  ? ?Patient has an automated home blood pressure machine- but it is a wrist machine, he reports fluctations from 130/80s on average up to 150s/100s.  ? ?Medication review was performed. They are taking medications as prescribed.  ? ?The following barriers to adherence were noted: ?- Affordability - periodically has difficulty with copays. ? ?The following interventions were completed:  ?- Medications were reviewed ?- Patient was educated on medications, including indication and administration ?- Patient was educated on proper technique to check home blood pressure and reminded to bring home machine and readings to next provider appointment.  ?- Patient was educated to consider accessing an arm BP machine.  ? ?I called patient to follow up on the above conversation ? ?The patient has follow up scheduled:  ?PCP: 07/28/21  ? ? ?Catie Eppie [name], PharmD, BCACP ?[facility] ?[phone] ? ?

## 2021-07-07 ENCOUNTER — Ambulatory Visit: Payer: Medicaid Other | Admitting: Rehabilitation

## 2021-07-07 ENCOUNTER — Ambulatory Visit: Payer: Medicaid Other | Admitting: Occupational Therapy

## 2021-07-07 ENCOUNTER — Encounter: Payer: Self-pay | Admitting: Occupational Therapy

## 2021-07-07 ENCOUNTER — Encounter: Payer: Self-pay | Admitting: Rehabilitation

## 2021-07-07 DIAGNOSIS — M6281 Muscle weakness (generalized): Secondary | ICD-10-CM

## 2021-07-07 DIAGNOSIS — R2681 Unsteadiness on feet: Secondary | ICD-10-CM | POA: Diagnosis not present

## 2021-07-07 DIAGNOSIS — R2689 Other abnormalities of gait and mobility: Secondary | ICD-10-CM

## 2021-07-07 DIAGNOSIS — R293 Abnormal posture: Secondary | ICD-10-CM

## 2021-07-07 NOTE — Therapy (Signed)
?OUTPATIENT OCCUPATIONAL THERAPY TREATMENT NOTE ? ? ?Patient Name: Benjamin [name] ?MRN: [id number] ?DOB:1960-12-11, 61 y.o., male ?Today's Date: 07/07/2021 ? ?PCP: [name], FNP ?REFERRING PROVIDER: Izora Ribas, MD  ? ?END OF SESSION:  ? OT End of Session - 07/07/21 1303   ? ? Visit Number 5   ? Number of Visits 17   ? Date for OT Re-Evaluation 08/03/21   ? Authorization Type [place] [facility] [employer] - approved 11 OT visits   ? Authorization Time Period 06/16/21 - 07/23/21   ? Authorization - Visit Number 4   ? Authorization - Number of Visits 11   ? OT Start Time 1230   ? OT Stop Time 1315   ? OT Time Calculation (min) 45 min   ? Activity Tolerance Patient tolerated treatment well   ? Behavior During Therapy [facility] for tasks assessed/performed   ? ?  ?  ? ?  ? ? ?Past Medical History:  ?Diagnosis Date  ? Back pain   ? Heavy smoker   ? Hyperlipidemia LDL goal <70   ? Hypertension   ? Peripheral arterial occlusive disease ([place]) 06/2013  ? Bilateral femoral artery disease  ? ?Past Surgical History:  ?Procedure Laterality Date  ? ABDOMINAL AORTOGRAM W/LOWER EXTREMITY N/A 07/12/2016  ? Procedure: Abdominal Aortogram w/Lower Extremity;  Surgeon: Serafina Mitchell, MD;  Location: [place] CV [facility];  Service: Cardiovascular;  Laterality: N/A;  Bilateral extermity: Patent Renal As. No sig Dz in infrarenal Abd Aorta. Normal Bilat Iliac arteries. R SFA is 100% @ origin - recon in AK-Pop A. R PT A patent. L CFA occluded. L PFA recon @ origin, L SFA occluded w/ recon in AK Pop A.  ? ABDOMINAL AORTOGRAM W/LOWER EXTREMITY N/A 10/15/2019  ? Procedure: ABDOMINAL AORTOGRAM W/LOWER EXTREMITY;  Surgeon: Serafina Mitchell, MD;  Location: [place] CV LAB;  Service: Cardiovascular;  Laterality: N/A;  ? AMPUTATION Left 10/04/2020  ? Procedure: LEFT ABOVE KNEE AMPUTATION;  Surgeon: Serafina Mitchell, MD;  Location: [place];  Service: Vascular;  Laterality: Left;  ? AMPUTATION Left 12/08/2020  ? Procedure: RIGHT ABOVE KNEE AMPUTATION REVISION;   Surgeon: Angelia Mould, MD;  Location: [facility] OR;  Service: Vascular;  Laterality: Left;  ? AORTA - BILATERAL FEMORAL ARTERY BYPASS GRAFT N/A 05/08/2020  ? Procedure: AORTA BIFEMORAL BYPASS GRAFT;  Surgeon: Serafina Mitchell, MD;  Location[facility];  Service: Vascular;  Laterality: N/A;  ? COLONOSCOPY  06/2016  ? never  ? ENDARTERECTOMY FEMORAL Left 11/14/2019  ? Procedure: Left groin exploration, Redo left femoral artery exposure;  Surgeon: Serafina Mitchell, MD;  Location: [place];  Service: Vascular;  Laterality: Left;  ? FEMORAL-FEMORAL BYPASS GRAFT N/A 11/13/2019  ? Procedure: BYPASS GRAFT FEMORAL-FEMORAL ARTERY RIGHT TO LEFT USING HEMASHIELD GOLD GRAFT 48m x 30cm;  Surgeon: BSerafina Mitchell MD;  [facility];  Service: Vascular;  Laterality: N/A;  ? FEMORAL-POPLITEAL BYPASS GRAFT Left 11/13/2019  ? Procedure: LEFT FEMORAL BELOW KNEE-POPLITEAL ARTERY USING NON-REVERSED GREATER SAPHENOUS VEIN;  Surgeon: BSerafina Mitchell MD;  [facility];  Service: Vascular;  Laterality: Left;  ? FEMORAL-POPLITEAL BYPASS GRAFT Left 05/08/2020  ? Procedure: LEFT REDO BYPASS GRAFT COMMON FEMORAL- BELOW KNEE POPLITEAL ARTERY USING PROPATEN GRAFT;  Surgeon: BSerafina Mitchell MD;  [facility];  Service: Vascular;  Laterality: Left;  ? I & D EXTREMITY Left 09/07/2020  ? Procedure: IRRIGATION AND DEBRIDEMENT LEFT LEG WITH EXCISION OF LEFT LEG DISTAL BYPASS GRAFT;  Surgeon: Marty Heck, MD;  Location: [facility] OR;  Service: Vascular;  Laterality: Left;  ? LOWER EXTREMITY ANGIOGRAM Left 11/14/2019  ? Procedure: LEFT LOWER EXTREMITY ANGIOGRAM, BYPASS GRAFT ANGIOPLASTY;  Surgeon: Serafina Mitchell, MD;  Location: [place];  Service: Vascular;  Laterality: Left;  ? PERIPHERAL VASCULAR INTERVENTION  10/15/2019  ? Procedure: PERIPHERAL VASCULAR INTERVENTION;  Surgeon: Serafina Mitchell, MD;  Location: [place] CV LAB;  Service: Cardiovascular;;  Rt Iliac  ? REMOVAL OF GRAFT Left 10/04/2020  ? Procedure: REMOVAL OF LEFT FEMORAL TO  POPITEAL BYPASS GRAFT;  Surgeon: Serafina Mitchell, MD;  Location: [place];  Service: Vascular;  Laterality: Left;  ? ?Patient Active Problem List  ? Diagnosis Date Noted  ? Acute blood loss anemia 12/25/2020  ? Hyponatremia 12/25/2020  ? 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[place]) 12/25/2020  ? Unilateral AKA, left ([place]) 12/15/2020  ? Protein-calorie malnutrition, severe 12/09/2020  ? Wound cellulitis 12/07/2020  ? 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 Ischemia of lower extremity 10/01/2020  ? 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 Femoral-popliteal bypass graft occlusion, left ([place]) 11/13/2019  ? PAD (peripheral artery disease) ([place]) 11/13/2019  ? Callus of foot 07/01/2019  ? Intermittent claudication ([place]) 01/20/2017  ? Need for influenza vaccination 01/20/2017  ? Needs smoking cessation education 09/06/2016  ? Abnormal echocardiogram 09/05/2016  ? Abnormal EKG 07/25/2016  ? Preoperative cardiovascular examination 07/25/2016  ? Shortness of breath on exertion 07/25/2016  ? Dyslipidemia, goal LDL below 70 07/25/2016  ? High blood pressure 07/25/2016  ? Abnormal chest x-ray 07/04/2016  ? Peripheral vascular disease ([place]) 07/04/2016  ? Decreased pedal pulses 06/24/2016  ? 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 Heavy smoker 06/24/2016  ? Abnormal weight loss 06/24/2016  ? Foot pain, left 06/24/2016  ? Screening for prostate cancer 06/24/2016  ? ? ?ONSET DATE: 12/08/20 (Lt AKA revision) ? ?REFERRING DIAG: D53.299M (ICD-10-CM) - Unilateral AKA, left (HCC)  ? ?THERAPY DIAG:  ?Muscle weakness (generalized) ? ?Unsteadiness on feet ? ? ?PERTINENT HISTORY: necrotic left above-knee amputation site. 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 PMH includes PAD, HLD, HTN, abnormal glucose.  ? ?PRECAUTIONS: **HTN, fall risk, Lt AKA w/ prosthestic ? ?SUBJECTIVE: I got the prosthesis cut down more yesterday at [facility]. My blood pressure was a little high. I've practiced putting on my prosthesis and pants 3 times since I last saw you ? ?PAIN:  ?Are you  having pain? No but reports mild pressure in chest.  ? ? ? ? ?OBJECTIVE:  ? ?TODAY'S TREATMENT: ? ?Practiced donning prosthesis and then pants, shoes and standing to pull up pants mod I level (w/ cues for efficiency) ? ?Practiced going to bathroom and performing clothes management for toileting: urination in standing, and then also practiced clothes management and toilet transfer for BM w/ min cues for safety and sequencing. Pt able to perform w/ no physical assist required.  ? ?BP: 157/92 after standing activity ? ?Reviewed entire theraband HEP verbally and pt returned demo of first ex (chair push up). Pt instructed to bring in red theraband next session to review remaining 3 ex's ? ?HOME EXERCISE PROGRAM/ PATIENT EDUCATION: ?06/16/21:  sequencing for LE dressing w/ prosthesis, tub transfer w/ bench, and A/E and DME recommendations ? 06/23/21: BUE strengthening HEP for posterior sh girdle ? ?GOALS: ?Goals reviewed with patient? Yes ?  ?SHORT TERM GOALS: Target date: 07/06/2021 ?  ?Independent with UB strengthening HEP  ?Baseline: Dependent ?Goal status: IN PROGRESS ?  ?2.  Pt will  verbalize understanding of DME and A/E to increase safety and independence with ADLS ?Baseline: Dependent ?Goal status: MET (tub bench and walker basket or tray handouts issued) ?  ?3.  Pt will perform LE dressing w/ prosthesis on I'ly w/ AE prn ?Baseline: dependent with prosthesis ?Goal status: MET ?  ?LONG TERM GOALS: Target date: 08/03/2021 ?  ?Pt to perform dynamic standing tasks/IADLS using prosthesis (washing dishes, folding clothes, light cleaning) ?Baseline: DEPENDENT ?Goal status: INITIAL ?  ?2.  Pt to wear prosthesis at least 50% of the time during the day for ADLS/IADLS ?Baseline: Not currently wearing ?Goal status: ONGOING ?  ?3. Pt will simulate tub transfer with proper DME safety and independently ?            Baseline: unable - does not have proper DME ?            Goal status: ONGOING - practiced in clinic w/o difficulty  06/16/21 ?  ?  ?ASSESSMENT: ?  ?CLINICAL IMPRESSION: ?Pt has met most STG's. Progress towards LTG's limited d/t poor carryover of recommendations and wearing time of prosthesis at home ?  ?PERFORMANCE DEFI

## 2021-07-07 NOTE — Therapy (Signed)
?OUTPATIENT PHYSICAL THERAPY PROSTHETIC TREATMENT NOTE ? ? ?Patient Name: Benjamin [name] ?MRN: [id number] ?DOB:May 20, 1960, 61 y.o., male ?Today's Date: 07/07/2021 ? ?PCP: Arnette Felts, FNP ?REFERRING PROVIDER: Arnette Felts, FNP ? ? PT End of Session - 07/07/21 1324   ? ? Visit Number 5   ? Number of Visits 16   ? Date for PT Re-Evaluation 08/03/21   ? Authorization Type [employer] (15 visits)   ? Progress Note Due on Visit 16   ? PT Start Time 1317   ? PT Stop Time 1400   ? PT Time Calculation (min) 43 min   ? Equipment Utilized During Treatment Gait belt   ? Activity Tolerance Patient tolerated treatment well   ? Behavior During Therapy [facility] for tasks assessed/performed   ? ?  ?  ? ?  ? ? ?Past Medical History:  ?Diagnosis Date  ? Back pain   ? Heavy smoker   ? Hyperlipidemia LDL goal <70   ? Hypertension   ? 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  ? Bilateral femoral artery disease  ? ?Past Surgical History:  ?Procedure Laterality Date  ? ABDOMINAL AORTOGRAM W/LOWER EXTREMITY N/A 07/12/2016  ? Procedure: Abdominal Aortogram w/Lower Extremity;  Surgeon: Nada Libman, MD;  [facility];  Service: Cardiovascular;  Laterality: N/A;  Bilateral extermity: Patent Renal As. No sig Dz in infrarenal Abd Aorta. Normal Bilat Iliac arteries. R SFA is 100% @ origin - recon in AK-Pop A. R PT A patent. L CFA occluded. L PFA recon @ origin, L SFA occluded w/ recon in AK Pop A.  ? ABDOMINAL AORTOGRAM W/LOWER EXTREMITY N/A 10/15/2019  ? Procedure: ABDOMINAL AORTOGRAM W/LOWER EXTREMITY;  Surgeon: Nada Libman, MD;  [facility]: [facility];  Service: Cardiovascular;  Laterality: N/A;  ? AMPUTATION Left 10/04/2020  ? Procedure: LEFT ABOVE KNEE AMPUTATION;  Surgeon: Nada Libman, MD;  Location: [facility] OR;  Service: Vascular;  Laterality: Left;  ? AMPUTATION Left 12/08/2020  ? Procedure: RIGHT ABOVE KNEE AMPUTATION REVISION;  Surgeon: Chuck Hint, MD;  Location: [facility] OR;  Service:  Vascular;  Laterality: Left;  ? AORTA - BILATERAL FEMORAL ARTERY BYPASS GRAFT N/A 05/08/2020  ? Procedure: AORTA BIFEMORAL BYPASS GRAFT;  Surgeon: Nada Libman, MD;  Location: [facility] OR;  Service: Vascular;  Laterality: N/A;  ? COLONOSCOPY  06/2016  ? never  ? ENDARTERECTOMY FEMORAL Left 11/14/2019  ? Procedure: Left groin exploration, Redo left femoral artery exposure;  Surgeon: Nada Libman, MD;  Location: [facility] OR;  Service: Vascular;  Laterality: Left;  ? FEMORAL-FEMORAL BYPASS GRAFT N/A 11/13/2019  ? Procedure: BYPASS GRAFT FEMORAL-FEMORAL ARTERY RIGHT TO LEFT USING HEMASHIELD GOLD GRAFT 58mm x 30cm;  Surgeon: Nada Libman, MD;  Location: [facility] OR;  Service: Vascular;  Laterality: N/A;  ? FEMORAL-POPLITEAL BYPASS GRAFT Left 11/13/2019  ? Procedure: LEFT FEMORAL BELOW KNEE-POPLITEAL ARTERY USING NON-REVERSED GREATER SAPHENOUS VEIN;  Surgeon: Nada Libman, MD;  [facility]: [facility] OR;  Service: Vascular;  Laterality: Left;  ? FEMORAL-POPLITEAL BYPASS GRAFT Left 05/08/2020  ? Procedure: LEFT REDO BYPASS GRAFT COMMON FEMORAL- BELOW KNEE POPLITEAL ARTERY USING PROPATEN GRAFT;  Surgeon: Nada Libman, MD;  [facility]: [facility] OR;  Service: Vascular;  Laterality: Left;  ? I & D EXTREMITY Left 09/07/2020  ? Procedure: IRRIGATION AND DEBRIDEMENT LEFT LEG WITH EXCISION OF LEFT LEG DISTAL BYPASS GRAFT;  Surgeon: Cephus Shelling, MD;  Location: [facility] OR;  Service: Vascular;  Laterality: Left;  ?  LOWER EXTREMITY ANGIOGRAM Left 11/14/2019  ? Procedure: LEFT LOWER EXTREMITY ANGIOGRAM, BYPASS GRAFT ANGIOPLASTY;  Surgeon: Nada Libman, MD;  [facility] OR;  Service: Vascular;  Laterality: Left;  ? PERIPHERAL VASCULAR INTERVENTION  10/15/2019  ? Procedure: PERIPHERAL VASCULAR INTERVENTION;  Surgeon: Nada Libman, MD;  [facility];  Service: Cardiovascular;;  Rt Iliac  ? REMOVAL OF GRAFT Left 10/04/2020  ? Procedure: REMOVAL OF LEFT FEMORAL TO POPITEAL BYPASS GRAFT;  Surgeon: Nada Libman, MD;  Location:  [facility] OR;  Service: Vascular;  Laterality: Left;  ? ?Patient Active Problem List  ? Diagnosis Date Noted  ? Acute blood loss anemia 12/25/2020  ? Hyponatremia 12/25/2020  ? 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 Protein-calorie malnutrition, severe 12/09/2020  ? Wound cellulitis 12/07/2020  ? 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 Ischemia of lower extremity 10/01/2020  ? 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 PAD (peripheral artery disease) (HCC) 11/13/2019  ? Callus of foot 07/01/2019  ? Intermittent claudication (HCC) 01/20/2017  ? Need for influenza vaccination 01/20/2017  ? Needs smoking cessation education 09/06/2016  ? Abnormal echocardiogram 09/05/2016  ? Abnormal EKG 07/25/2016  ? Preoperative cardiovascular examination 07/25/2016  ? Shortness of breath on exertion 07/25/2016  ? Dyslipidemia, goal LDL below 70 07/25/2016  ? High blood pressure 07/25/2016  ? Abnormal chest x-ray 07/04/2016  ? Peripheral vascular disease (HCC) 07/04/2016  ? Decreased pedal pulses 06/24/2016  ? 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 Heavy smoker 06/24/2016  ? Abnormal weight loss 06/24/2016  ? Foot pain, left 06/24/2016  ? Screening for prostate cancer 06/24/2016  ? ? ?ONSET DATE: 12/08/2020 ?  ?REFERRING DIAG: Q11.941D (ICD-10-CM) - Unilateral AKA, left (HCC)  ?  ?THERAPY DIAG:  ?Other abnormalities of gait and mobility - Plan: PT plan of care cert/re-cert ?  ?Unsteadiness on feet - Plan: PT plan of care cert/re-cert ?  ?Hx of AKA (above knee amputation), left (HCC) - Plan: PT plan of care cert/re-cert ?  ?SUBJECTIVE:  ?  ?SUBJECTIVE STATEMENT: ?Pt is only wearing prosthesis "some" days.  Maybe on the weekends.   ? ?PERTINENT HISTORY: PMH significant for HTN, HLD, heavy smoking, peripheral artery disease s/p bilateral femoral artery bypass graft  ? ? ?PAIN:  ?Are you having pain? No  ? ? ?    ? ? PATIENT  EDUCATION: ?Education details: Skin check, Correct ply sock adjustment, Propper donning, Propper doffing, Proper wear schedule/adjustment, and Proper weight-bearing schedule/adjustment ?Person educated: Patient ?Education method: Explanation, Demonstration, and Verbal cues ?Education comprehension: verbalized understanding, returned demonstration, verbal cues required, and needs further education ? ?TODAY'S TREATMENT:  ?  ?TREATMENT:  ?Prosthetic education:  Continue to educate on that goal is to eventually wear prosthesis all of his awake hours.  Re-iterated that he needs to wear prosthesis consistently 2hrs in the morning and 2 hours in the afternoon, daily.  Discussed that he needs to inspect skin when he removes leg for any redness, sweating, skin breakdown, etc.  Educated that if he is tolerating well, to increase wear time to 3hrs, 2x/day this weekend.  Re-iterated that he does not have to be up on legs for this amount of time, but just have leg donned.  Continue to educate on use of socks as he does not bring them with him and was unable to verbalize how to know if he needs to  adjust them.  After walking, standing, still feel that he is slightly tall on prosthetic side and he has an appt at [facility] on Monday so told him to ask them to shorten slightly.  Pt verbalized understanding.  ? ?Prosthetic HEP:  See details below.  PT placed tape on floor and on counter to have him work on equal WB and weight shift to the L during exercises.  Discussed that he should line up with kitchen sink middle and can place tape or ruler, etc on floor lined up with sink so that he can ensure where midline is.  Pt verbalized understanding.  ? ?Gait training:  Gait x 80', then 15' x 2 with RW with CGA/min A with continued cues for RW, step, RW, step sequence to avoid stepping too far into RW.  Cues for smaller prosthetic step and larger R step, upright posture and relaxed shoulders.  Pt continues to want to vault on RLE, therefore  think that prosthesis may be too long.  He reports pain in bottom of residual limb and up towards groin, but pt has no socks to don.  Educated to bring them with him when he is out.   ?  ?PATIENT EDU

## 2021-07-07 NOTE — Progress Notes (Signed)
Called to follow up with patient as he reported periodically having difficulty affording his copays to gain more insight and see if there is anything we can do to assist him. Called home phone number and spoke with someone who told me he was not available at this time. Called cell phone number and the call did not go through.  ?

## 2021-07-08 ENCOUNTER — Other Ambulatory Visit: Payer: Self-pay

## 2021-07-08 NOTE — Patient Outreach (Signed)
Medicaid Managed Care   Nurse Care Manager Note  07/08/2021 Name:  Benjamin [name] MRN:  [id number] DOB:  1960/04/28  Benjamin [name] is an 61 y.o. year old male who is a primary patient of [name], [place].  The [facility] Managed Care Coordination team was consulted for assistance with:    HTN PAD  Mr. Valli was given information about Medicaid Managed Care Coordination team services today. Benjamin [name] Primary Caregiver, Garth Bigness - sister, agreed to services and verbal consent obtained.  Engaged with patient by telephone for follow up visit in response to provider referral for case management and/or care coordination services.   Assessments/Interventions:  Review of past medical history, allergies, medications, health status, including review of consultants reports, laboratory and other test data, was performed as part of comprehensive evaluation and provision of chronic care management services.  SDOH (Social Determinants of Health) assessments and interventions performed:   Care Plan  Allergies  Allergen Reactions   Gabapentin Other (See Comments)    Causes lethargy   Tetanus Toxoids Other (See Comments)    Tinnitus started after he took this   Aspirin Nausea And Vomiting   Garlic Other (See Comments)    Sneezing     Medications Reviewed Today     Reviewed by Inge Rise, RN (Case Manager) on 07/08/21 at 1128  Med List Status: <None>   Medication Order Taking? Sig Documenting Provider Last Dose Status Informant  acetaminophen (TYLENOL) 325 MG tablet [id number] No Take 1-2 tablets (325-650 mg total) by mouth every 4 (four) hours as needed for mild pain.  Patient not taking: Reported on 06/22/2021   Bary Leriche, PA-C Not Taking Active Self  albuterol (VENTOLIN HFA) 108 (90 Base) MCG/ACT inhaler [id number] No Inhale 1-2 puffs into the lungs every 6 (six) hours as needed for wheezing or shortness of breath. Jeanell Sparrow, DO Taking Active Self  atorvastatin  (LIPITOR) 20 MG tablet [id number] No Take 1 tablet (20 mg total) by mouth at bedtime. [name], FNP Taking Active            Med Note [facility], Janaisha Tolsma A   Wed Jun 30, 2021 10:40 AM) Dewaine Conger in the AM  carvedilol (COREG) 6.25 MG tablet [id number]  Take 1 tablet (6.25 mg total) by mouth 2 (two) times daily with a meal. Raulkar, Clide Deutscher, MD  Active   cloNIDine (CATAPRES - DOSED IN MG/24 HR) 0.1 mg/24hr patch [id number]  Place 1 patch (0.1 mg total) onto the skin once a week. Izora Ribas, MD  Active   clopidogrel (PLAVIX) 75 MG tablet [id number] No Take 1 tablet (75 mg total) by mouth daily.  Taking Active Self  docusate sodium (COLACE) 100 MG capsule [id number] No Take 1 capsule (100 mg total) by mouth daily.  Patient not taking: Reported on 06/22/2021   Izora Ribas, MD Not Taking Active Self  gabapentin (NEURONTIN) 600 MG tablet [id number] No Take 1 tablet (600 mg total) by mouth 2 (two) times daily. Izora Ribas, MD Taking Active Self           Med Note [facility], Gwenette Greet A   Wed Jun 30, 2021 10:42 AM) Taking PRN.  guaiFENesin-dextromethorphan (ROBITUSSIN DM) 100-10 MG/5ML syrup [id number] No Take 15 mLs by mouth every 4 (four) hours as needed for cough.  Patient not taking: Reported on 06/22/2021   Terrilee Croak, MD Not Taking Active Self  losartan (COZAAR) 50 MG tablet [id number]  Take 1 tablet (50  mg total) by mouth in the morning and at bedtime. Izora Ribas, MD  Active   methocarbamol (ROBAXIN) 500 MG tablet [id number] No Take 1 tablet (500 mg total) by mouth every 6 (six) hours as needed for muscle spasms.  Patient not taking: Reported on 06/22/2021   Bary Leriche, PA-C Not Taking Active Self  Multiple Vitamin (MULTIVITAMIN WITH MINERALS) TABS tablet [id number] No Take 1 tablet by mouth daily.  Patient not taking: Reported on 06/22/2021   Terrilee Croak, MD Not Taking Active Self  NON FORMULARY [id number] No 1-5 drops See admin instructions. Unnamed tinnitus relief ear  drops- Instill 1-5 drops into the affected ear(s) two times a day as needed for tinnitus [provider] Taking Active Self  nortriptyline (PAMELOR) 50 MG capsule [id number] No Take 2 capsules (100 mg total) by mouth at bedtime. Azucena Cecil, PA-C Taking Active            Med Note [facility], Gwenette Greet A   Wed Jun 30, 2021 10:46 AM) Questionable if patient will take per sister.  Patient stated he does not like how it makes him feel per sister.  senna (SENOKOT) 8.6 MG TABS tablet [id number] No Take 1 tablet (8.6 mg total) by mouth 2 (two) times daily.  Patient not taking: Reported on 06/22/2021   Bary Leriche, PA-C Not Taking Active Self  Vitamins-Lipotropics (LIPO-FLAVONOID PLUS PO) [id number] No Take 1 capsule by mouth 2 (two) times daily as needed (for tinnitus). 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   PAD (peripheral artery disease) ([place]) 11/13/2019   Callus of foot 07/01/2019   Intermittent claudication ([place]) 01/20/2017   Need for influenza vaccination 01/20/2017   Needs smoking cessation education 09/06/2016   Abnormal echocardiogram 09/05/2016   Abnormal EKG 07/25/2016   Preoperative cardiovascular examination 07/25/2016   Shortness of breath on exertion 07/25/2016   Dyslipidemia, goal LDL below 70 07/25/2016   High blood pressure 07/25/2016   Abnormal chest x-ray 07/04/2016   Peripheral vascular disease ([employer]) 07/04/2016   Decreased pedal pulses 06/24/2016   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   Heavy smoker 06/24/2016   Abnormal weight loss 06/24/2016   Foot pain, left 06/24/2016   Screening for prostate cancer 06/24/2016    Conditions to be addressed/monitored per PCP order:  HTN and PAD  Care Plan : RN Care Manger Plan of Care  Updates made by Inge Rise, RN since 07/08/2021 12:00 AM     Problem: Chronic Disease Management and Care Coordination Needs for HTN, PAD with status post left AKA   Priority: High     Long-Range Goal: Development of Plan of Care for Chronic Disease Management and Care Coordination Needs (HTN, PAD with status post left AKA)   Start Date: 02/18/2021  Expected End Date: 09/19/2021  Priority: High  Note:   Current Barriers:  Knowledge Deficits related to plan of care for management of HTN and PAD with status post left AKA on 10/04/2020  Care  Coordination needs related to Financial constraints related to obtaining medications and rent, Medication procurement, and ADL IADL limitations  Chronic Disease Management support and education needs related to HTN and PAD with status post left AKA Financial Constraints.  Difficulty obtaining medications  RNCM Clinical Goal(s):  RNCM will help patient verbalize understanding of plan for management of HTN and PAD with status post left AKA as evidenced by improved management of these chronic diseases/ verbalize basic understanding of HTN and PAD with status post left AKA disease process and self health management plan as evidenced by improved management of these chronic diseases. take all medications exactly as prescribed and will call provider for medication related questions as evidenced by being compliant with all medications    attend all scheduled medical appointments: 07/28/2021 PCP Visit; 08/16/2021 ENT visit; weekly Physical Therapy as evidenced by attending all scheduled appointments        demonstrate improved adherence to prescribed treatment plan for HTN and PAD with status  post left AKA as evidenced by blood pressure readings within normal limits and proper healing of left AKA. demonstrate improved health management independence as evidenced by blood pressure readings within normal limits and proper healing of left AKA.        continue to work with Consulting civil engineer and/or Social Worker to address care management and care coordination needs related to HTN and PAD with status post left AKA as evidenced by adherence to CM Team Scheduled appointments     work with pharmacist to address complex medication regimen related to HTN and PAD with status post left AKA as evidenced by review of EMR and patient or pharmacist report    work with Education officer, museum to address Lacks knowledge of community resource: for [place] covered under [employer] related to the management of HTN and PAD with status post left AKA as evidenced by review of EMR and patient or Education officer, museum report     through collaboration with Consulting civil engineer, provider, and care team.   Interventions: Inter-disciplinary care team collaboration (see longitudinal plan of care) Evaluation of current treatment plan related to  self management and patient's adherence to plan as established by provider BSW contacted patient regarding Dental resources. Patient stated he would like dental resources in the area to be mailed to him. Patient stated that everything else was good and no other resources are needed at this time.  03/29/21: BSW completed follow up with patient. He stated he did not receive the letter with the dental resources. Patient stated he would also like eye resources. BSW will send a letter with dental and eye resources.   PAD with status post left AKA on 10/04/2020 and revision on 12/08/2020  (Status: Goal on Track (progressing): YES.) Long Term Goal    Evaluation of current treatment plan related to  PAD with status post left AKA , Financial constraints related to obtaining medications  (zinc tablets for AKA healing) and rent, Medication procurement, Inability to perform IADL's independently, and Lacks knowledge of community resource: for dental care & vision care services covered by [employer],  self-management and patient's adherence to plan as established by provider. Discussed plans with patient for ongoing care management follow up and provided patient with direct contact information for care management team Advised patient to discuss making possible adjustments to prosthesis with [facility] since the patient feels the prosthesis is longer than his right leg.  This information given the the patient's caregiver,  his sister Kern Reap, since today's visit as with [name] only.; Reviewed medications with patient and discussed importance of medication compliance and benefits of zinc & Vitamin C for healing of left AKA; Reviewed scheduled/upcoming provider appointments including : 07/28/2021 PCP Office Visit; 08/16/2021 ENT visit; Weekly Physical Therapy sessions; Social Work referral for information regarding dental & vision services covered under [employer] ; Pharmacy referral for complex medication regimen;  Plan to follow up. Patient received Left AKA prosthesis weeks ago.  Patient thinks the Left AKA prosthesis is longer than his Right leg. Patient continues to work with Physical Therapy (PT) & Occupational Therapy (OT) 2 times/week.  Patient told PT this week at he has an appointment with [facility] next week and will ask them to make an adjustment to prosthesis.  Sister is not aware of the [facility] appointment.  Patient not wearing his prosthesis consistently.  Patient uses a walker the majority of the time to get around.  Sister reports that the patient is very mobile and drives his car independently.  Patient continues to work out at a gym [employer]ers adviser) to build his upper extremity strength.  Sister states that the patient enjoys using the massage chair  at the gym to help relax and relief soreness.  Hypertension: (Status: Goal on track: NO.) Long Term Goal  Last practice recorded BP readings:  BP Readings from Last 3 Encounters:  07/05/21 (!) 165/92  07/03/21 (!) 156/92  07/02/21 (!) 147/78    Most recent eGFR/CrCl:  Lab Results  Component Value Date   EGFR 101 07/22/2020    No components found for: CRCL  Evaluation of current treatment plan related to hypertension self management and patient's adherence to plan as established by provider;   Reviewed medications with patient and discussed importance of compliance;  Counseled on the importance of exercise goals with target of 150 minutes per week Discussed plans with patient for ongoing care management follow up and provided patient with direct contact information for care management team; Advised patient, providing education and rationale, to monitor blood pressure daily and record, calling PCP for findings outside established parameters;  Reviewed scheduled/upcoming provider appointments including: 07/28/2021 PCP Office Visit; 08/16/2021 ENT Visit; Weekly Physical Therapy and Occupational Therapy sessions 2 times/week. Patient has had 9 ED Visits in last 30 days for patient's concern related to elevated blood pressure.  Today's telephone visit is with patient's caregiver, sister Baer Pilar, only since patient is at the gym.  I re-enforced that patient should only check his blood pressure once a day and remove the wrist blood pressure monitor so he is not monitoring his blood pressure constantly. [name] states patient had it off his wrist for a few days but is back to wearing it throughout the day and night again.  Reviewed medications with sister especially new medication for HTN: Clonidine (Catapres) patch which is applied once a week.  Instructed her to apply the patch on the same day each week and remove the old patch at the same time. Sister wrote instructions down and able to verbalize  understanding.  Sister is responsible for giving patient all his medications.  [name] states that patient is taking AM and PM medications correctly now with her involvement. Discussed patient transitioning to [place] Management.  Patient has decided to move forward with joining [place] for his primary care and case management.  Next step needed is for patient to make an appointment for new PCP visit in near future.  This RN  Care Manager will stay involved until offically signed on with [facility] and will keep current PCP updated.    Patient Goals/Self-Care Activities: Take medications as prescribed   Attend all scheduled provider appointments Call pharmacy for medication refills 3-7 days in advance of running out of medications Call provider office for new concerns or questions  Work with the social worker to address care coordination needs and will continue to work with the clinical team to address health care and disease management related needs Work with PCP to obtain a blood pressure monitor- completed.       Follow Up:  Patient agrees to Care Plan and Follow-up.  Plan: The Managed Medicaid care management team will reach out to the patient again over the next 14 days.  Date/time of next scheduled RN care management/care coordination outreach:  July 22, 2021 at 10:00 am  [place], [place] [facility] Mobile: [phone]

## 2021-07-08 NOTE — Patient Instructions (Signed)
Visit Information  Benjamin [name] was given information about Medicaid Managed Care team care coordination services as a part of their Healthy [facility] Medicaid benefit. Benjamin [name], Benjamin [name], verbally consented to engagement with the [facility] Managed Care team.   If you are experiencing a medical emergency, please call 911 or report to your local emergency department or urgent care.   If you have a non-emergency medical problem during routine business hours, please contact your provider's office and ask to speak with a nurse.   For questions related to your Healthy [facility] health plan, please call: [phone] or visit the homepage here: [email or web address]  If you would like to schedule transportation through your Healthy [facility] plan, please call the following number at least 2 days in advance of your appointment: [phone]  For information about your ride after you set it up, call Ride Assist at [phone]. Use this number to activate a Will Call pickup, or if your transportation is late for a scheduled pickup. Use this number, too, if you need to make a change or cancel a previously scheduled reservation.  If you need transportation services right away, call [phone]. The after-hours call center is staffed 24 hours to handle ride assistance and urgent reservation requests (including discharges) 365 days a year. Urgent trips include sick visits, hospital discharge requests and life-sustaining treatment.  Call the [place] at [phone], at any time, 24 hours a day, 7 days a week. If you are in danger or need immediate medical attention call 911.  If you would like help to quit smoking, call 1-800-QUIT-NOW 440-888-8077) OR Espaol: 1-855-Djelo-Ya (8-115-726-2035) o para ms informacin haga clic aqu or Text READY to 200-400 to register via text  [name] - following are the goals we  discussed in your visit today:  Please see Patient Goals in the [place] of Care below.  Please see education materials related to today's visit provided as print materials.   The patient verbalized understanding of instructions, educational materials, and care plan provided today and agreed to receive a mailed copy of patient instructions, educational materials, and care plan.   The Managed Medicaid care management team will reach out to the patient again over the next 14 days.   Benjamin Marvel RN, BSN Community Care Coordinator [place] [facility] Mobile: [phone]   Following is a copy of your plan of care:  Care Plan : [place] of Care  Updates made by Benjamin Rise, RN since 07/08/2021 12:00 AM     Problem: Chronic Disease Management and Care Coordination Needs for HTN, PAD with status post left AKA   Priority: High     Long-Range Goal: Development of Plan of Care for Chronic Disease Management and Care Coordination Needs (HTN, PAD with status post left AKA)   Start Date: 02/18/2021  Expected End Date: 09/19/2021  Priority: High  Note:   Current Barriers:  Knowledge Deficits related to plan of care for management of HTN and PAD with status post left AKA on 10/04/2020  Care Coordination needs related to Financial constraints related to obtaining medications and rent, Medication procurement, and ADL IADL limitations  Chronic Disease Management support and education needs related to HTN and PAD with status post left AKA Financial Constraints.  Difficulty obtaining medications  RNCM Clinical Goal(s):  RNCM will help patient verbalize understanding of plan for management of HTN and PAD with status post left AKA  as evidenced by improved management of these chronic diseases/ verbalize basic understanding of HTN and PAD with status post left AKA disease process and self health management plan as evidenced by improved management of  these chronic diseases. take all medications exactly as prescribed and will call provider for medication related questions as evidenced by being compliant with all medications    attend all scheduled medical appointments: 07/28/2021 PCP Visit; 08/16/2021 ENT visit; weekly Physical Therapy as evidenced by attending all scheduled appointments        demonstrate improved adherence to prescribed treatment plan for HTN and PAD with status post left AKA as evidenced by blood pressure readings within normal limits and proper healing of left AKA. demonstrate improved health management independence as evidenced by blood pressure readings within normal limits and proper healing of left AKA.        continue to work with Consulting civil engineer and/or Social Worker to address care management and care coordination needs related to HTN and PAD with status post left AKA as evidenced by adherence to CM Team Scheduled appointments     work with pharmacist to address complex medication regimen related to HTN and PAD with status post left AKA as evidenced by review of EMR and patient or pharmacist report    work with Education officer, museum to address Lacks knowledge of community resource: for [place] covered under [employer] related to the management of HTN and PAD with status post left AKA as evidenced by review of EMR and patient or Education officer, museum report     through collaboration with Consulting civil engineer, provider, and care team.   Interventions: Inter-disciplinary care team collaboration (see longitudinal plan of care) Evaluation of current treatment plan related to  self management and patient's adherence to plan as established by provider BSW contacted patient regarding Dental resources. Patient stated he would like dental resources in the area to be mailed to him. Patient stated that everything else was good and no other resources are needed at this time.  03/29/21: BSW completed follow up with patient. He stated  he did not receive the letter with the dental resources. Patient stated he would also like eye resources. BSW will send a letter with dental and eye resources.   PAD with status post left AKA on 10/04/2020 and revision on 12/08/2020  (Status: Goal on Track (progressing): YES.) Long Term Goal    Evaluation of current treatment plan related to  PAD with status post left AKA , Financial constraints related to obtaining medications (zinc tablets for AKA healing) and rent, Medication procurement, Inability to perform IADL's independently, and Lacks knowledge of community resource: for dental care & vision care services covered by [employer],  self-management and patient's adherence to plan as established by provider. Discussed plans with patient for ongoing care management follow up and provided patient with direct contact information for care management team Advised patient to discuss making possible adjustments to prosthesis with [facility] since the patient feels the prosthesis is longer than his right leg.  This information given the the patient's [name], his [name] Benjamin [name], since today's visit as with Benjamin [name] only.; Reviewed medications with patient and discussed importance of medication compliance and benefits of zinc & Vitamin C for healing of left AKA; Reviewed scheduled/upcoming provider appointments including : 07/28/2021 PCP Office Visit; 08/16/2021 ENT visit; Weekly Physical Therapy sessions; Social Work referral for information regarding dental & vision services covered under [employer] ; Pharmacy  referral for complex medication regimen;  Plan to follow up. Patient received Left AKA prosthesis weeks ago.  Patient thinks the Left AKA prosthesis is longer than his Right leg. Patient continues to work with Physical Therapy (PT) & Occupational Therapy (OT) 2 times/week.  Patient told PT this week at he has an appointment with [name] clinic next week and will ask them to make an  adjustment to prosthesis.  [name] is not aware of the [facility] appointment.  Patient not wearing his prosthesis consistently.  Patient uses a walker the majority of the time to get around.  [name] reports that the patient is very mobile and drives his car independently.  Patient continues to work out at a gym Careers adviser) to build his upper extremity strength.  [name] states that the patient enjoys using the massage chair at the gym to help relax and relief soreness.  Hypertension: (Status: Goal on track: NO.) Long Term Goal  Last practice recorded BP readings:  BP Readings from Last 3 Encounters:  07/05/21 (!) 165/92  07/03/21 (!) 156/92  07/02/21 (!) 147/78    Most recent eGFR/CrCl:  Lab Results  Component Value Date   EGFR 101 07/22/2020    No components found for: CRCL  Evaluation of current treatment plan related to hypertension self management and patient's adherence to plan as established by provider;   Reviewed medications with patient and discussed importance of compliance;  Counseled on the importance of exercise goals with target of 150 minutes per week Discussed plans with patient for ongoing care management follow up and provided patient with direct contact information for care management team; Advised patient, providing education and rationale, to monitor blood pressure daily and record, calling PCP for findings outside established parameters;  Reviewed scheduled/upcoming provider appointments including: 07/28/2021 PCP Office Visit; 08/16/2021 ENT Visit; Weekly Physical Therapy and Occupational Therapy sessions 2 times/week. Patient has had 9 ED Visits in last 30 days for patient's concern related to elevated blood pressure.  Today's telephone visit is with patient's [name], [name] Benjamin [name], only since patient is at the gym.  I re-enforced that patient should only check his blood pressure once a day and remove the wrist blood pressure monitor so he is not monitoring his  blood pressure constantly. Benjamin [name] states patient had it off his wrist for a few days but is back to wearing it throughout the day and night again.  Reviewed medications with [name] especially new medication for HTN: Clonidine (Catapres) patch which is applied once a week.  Instructed her to apply the patch on the same day each week and remove the old patch at the same time. [name] wrote instructions down and able to verbalize understanding.  [name] is responsible for giving patient all his medications.  Benjamin [name] states that patient is taking AM and PM medications correctly now with her involvement. Discussed patient transitioning to [facility].  Patient has decided to move forward with joining [facility] for his primary care and case management.  Next step needed is for patient to make an appointment for new PCP visit in near future.  This RN Care Manager will stay involved until offically signed on with [place] and will keep current PCP updated.    Patient Goals/Self-Care Activities: Take medications as prescribed   Attend all scheduled provider appointments Call pharmacy for medication refills 3-7 days in advance of running out of medications Call provider office for new concerns or questions  Work with the social worker to address care coordination  needs and will continue to work with the clinical team to address health care and disease management related needs Work with PCP to obtain a blood pressure monitor- completed.

## 2021-07-09 ENCOUNTER — Encounter: Payer: Self-pay | Admitting: Occupational Therapy

## 2021-07-09 ENCOUNTER — Telehealth: Payer: Self-pay | Admitting: Licensed Clinical Social Worker

## 2021-07-09 ENCOUNTER — Ambulatory Visit: Payer: Medicaid Other

## 2021-07-09 ENCOUNTER — Ambulatory Visit: Payer: Medicaid Other | Admitting: Occupational Therapy

## 2021-07-09 VITALS — BP 165/75 | HR 85

## 2021-07-09 DIAGNOSIS — R293 Abnormal posture: Secondary | ICD-10-CM

## 2021-07-09 DIAGNOSIS — R2689 Other abnormalities of gait and mobility: Secondary | ICD-10-CM

## 2021-07-09 DIAGNOSIS — R2681 Unsteadiness on feet: Secondary | ICD-10-CM | POA: Diagnosis not present

## 2021-07-09 DIAGNOSIS — M6281 Muscle weakness (generalized): Secondary | ICD-10-CM

## 2021-07-09 NOTE — Therapy (Signed)
OUTPATIENT OCCUPATIONAL THERAPY TREATMENT NOTE   Patient Name: Benjamin [name] MRN: [id number] DOB:16-Feb-1961, 61 y.o., male Today's Date: 07/09/2021  PCP: [name], FNP REFERRING PROVIDER: Izora Ribas, MD   END OF SESSION:   OT End of Session - 07/09/21 1235     Visit Number 6    Number of Visits 17    Date for OT Re-Evaluation 08/03/21    Authorization Type [place] [employer] - approved 11 OT visits    Authorization Time Period 06/16/21 - 07/23/21    Authorization - Visit Number 5    Authorization - Number of Visits 11    OT Start Time 0321    OT Stop Time 1315    OT Time Calculation (min) 41 min    Activity Tolerance Patient tolerated treatment well    Behavior During Therapy [facility] for tasks assessed/performed             Past Medical History:  Diagnosis Date   Back pain    Heavy smoker    Hyperlipidemia LDL goal <70    Hypertension    Peripheral arterial occlusive disease ([place]) 06/2013   Bilateral femoral artery disease   Past Surgical History:  Procedure Laterality Date   ABDOMINAL AORTOGRAM W/LOWER EXTREMITY N/A 07/12/2016   Procedure: Abdominal Aortogram w/Lower Extremity;  Surgeon: Serafina Mitchell, MD;  Location: [place] [facility];  Service: Cardiovascular;  Laterality: N/A;  Bilateral extermity: Patent Renal As. No sig Dz in infrarenal Abd Aorta. Normal Bilat Iliac arteries. R SFA is 100% @ origin - recon in AK-Pop A. R PT A patent. L CFA occluded. L PFA recon @ origin, L SFA occluded w/ recon in AK Pop A.   ABDOMINAL AORTOGRAM W/LOWER EXTREMITY N/A 10/15/2019   Procedure: ABDOMINAL AORTOGRAM W/LOWER EXTREMITY;  Surgeon: Serafina Mitchell, MD;  Location: [place] [facility];  Service: Cardiovascular;  Laterality: N/A;   AMPUTATION Left 10/04/2020   Procedure: LEFT ABOVE KNEE AMPUTATION;  Surgeon: Serafina Mitchell, MD;  Location: [place] [facility];  Service: Vascular;  Laterality: Left;   AMPUTATION Left 12/08/2020   Procedure: RIGHT ABOVE KNEE AMPUTATION REVISION;   Surgeon: Angelia Mould, MD;  Location: [facility];  Service: Vascular;  Laterality: Left;   AORTA - BILATERAL FEMORAL ARTERY BYPASS GRAFT N/A 05/08/2020   Procedure: AORTA BIFEMORAL BYPASS GRAFT;  Surgeon: Serafina Mitchell, MD;  [facility];  Service: Vascular;  Laterality: N/A;   COLONOSCOPY  06/2016   never   ENDARTERECTOMY FEMORAL Left 11/14/2019   Procedure: Left groin exploration, Redo left femoral artery exposure;  Surgeon: Serafina Mitchell, MD;  Location: [facility] OR;  Service: Vascular;  Laterality: Left;   FEMORAL-FEMORAL BYPASS GRAFT N/A 11/13/2019   Procedure: BYPASS GRAFT FEMORAL-FEMORAL ARTERY RIGHT TO LEFT USING HEMASHIELD GOLD GRAFT 72m x 30cm;  Surgeon: BSerafina Mitchell MD;  [facility];  Service: Vascular;  Laterality: N/A;   FEMORAL-POPLITEAL BYPASS GRAFT Left 11/13/2019   Procedure: LEFT FEMORAL BELOW KNEE-POPLITEAL ARTERY USING NON-REVERSED GREATER SAPHENOUS VEIN;  Surgeon: BSerafina Mitchell MD;  [facility];  Service: Vascular;  Laterality: Left;   FEMORAL-POPLITEAL BYPASS GRAFT Left 05/08/2020   Procedure: LEFT REDO BYPASS GRAFT COMMON FEMORAL- BELOW KNEE POPLITEAL ARTERY USING PROPATEN GRAFT;  Surgeon: BSerafina Mitchell MD;  [facility];  Service: Vascular;  Laterality: Left;   I & D EXTREMITY Left 09/07/2020   Procedure: IRRIGATION AND DEBRIDEMENT LEFT LEG WITH EXCISION OF LEFT LEG DISTAL BYPASS GRAFT;  Surgeon: Marty Heck, MD;  [facility] OR;  Service: Vascular;  Laterality: Left;   LOWER EXTREMITY ANGIOGRAM Left 11/14/2019   Procedure: LEFT LOWER EXTREMITY ANGIOGRAM, BYPASS GRAFT ANGIOPLASTY;  Surgeon: Serafina Mitchell, MD;  [facility];  Service: Vascular;  Laterality: Left;   PERIPHERAL VASCULAR INTERVENTION  10/15/2019   Procedure: PERIPHERAL VASCULAR INTERVENTION;  Surgeon: Serafina Mitchell, MD;  Location: [place] [facility];  Service: Cardiovascular;;  Rt Iliac   REMOVAL OF GRAFT Left 10/04/2020   Procedure: REMOVAL OF LEFT FEMORAL TO  POPITEAL BYPASS GRAFT;  Surgeon: Serafina Mitchell, MD;  [facility];  Service: Vascular;  Laterality: Left;   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[place]) 12/25/2020   Unilateral AKA, left ([place]) 12/15/2020   Protein-calorie malnutrition, severe 12/09/2020   Wound cellulitis 12/07/2020   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/2020   Ischemia of lower extremity 10/01/2020   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   Femoral-popliteal bypass graft occlusion, left ([place]) 11/13/2019   PAD (peripheral artery disease) ([place]) 11/13/2019   Callus of foot 07/01/2019   Intermittent claudication ([place]) 01/20/2017   Need for influenza vaccination 01/20/2017   Needs smoking cessation education 09/06/2016   Abnormal echocardiogram 09/05/2016   Abnormal EKG 07/25/2016   Preoperative cardiovascular examination 07/25/2016   Shortness of breath on exertion 07/25/2016   Dyslipidemia, goal LDL below 70 07/25/2016   High blood pressure 07/25/2016   Abnormal chest x-ray 07/04/2016   Peripheral vascular disease ([place]) 07/04/2016   Decreased pedal pulses 06/24/2016   Varicose vein of leg 06/24/2016   Heavy smoker 06/24/2016   Abnormal weight loss 06/24/2016   Foot pain, left 06/24/2016   Screening for prostate cancer 06/24/2016    ONSET DATE: 12/08/20 (Lt AKA revision)  REFERRING DIAG: F00.712R (ICD-10-CM) - Unilateral AKA, left (HCC)   THERAPY DIAG:  Muscle weakness (generalized)  Unsteadiness on feet  Other abnormalities of gait and mobility  Abnormal posture   PERTINENT HISTORY: necrotic left above-knee amputation site. 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 PMH includes PAD, HLD, HTN, abnormal glucose.   PRECAUTIONS: **HTN, fall risk, Lt AKA w/ prosthestic  SUBJECTIVE: Pt reports still needing to get his prosthesis cut down more and reports will be going today. "I can't get in and out of my  car with it on"   PAIN:  Are you having pain? No but reports mild pressure in chest.      OBJECTIVE:   TODAY'S TREATMENT:  Vitals:   07/09/21 1237 07/09/21 1257  BP: (!) 143/86 (!) 165/75 after LB Dressing  Pulse: 82 85    Pt left prosthesis in car - therapist went to fetch prosthesis for patient to practice with donning.  LB Dressing Reviewed and practiced donning prosthesis with mod I and verbal cues for sequencing and efficiency. Pt completed donning pants and shoes after prosthesis - again mod I with verbal cues for strategies.  Arm Bike: for conditioning and reciprocal movements on Level 2 for 6 minutes (forward and backward)   ADLs pt reports has not received tub bench yet and is wearing prosthesis for 2 hours at a time approx 2 x a day.     HOME EXERCISE PROGRAM/ PATIENT EDUCATION: 06/16/21:  sequencing for LE dressing w/ prosthesis, tub transfer w/ bench, and A/E and DME recommendations  06/23/21: BUE strengthening HEP for posterior sh girdle  GOALS: Goals reviewed  with patient? Yes   SHORT TERM GOALS: Target date: 07/06/2021   Independent with UB strengthening HEP  Baseline: Dependent Goal status: IN PROGRESS   2.  Pt will verbalize understanding of DME and A/E to increase safety and independence with ADLS Baseline: Dependent Goal status: MET (tub bench and walker basket or tray handouts issued)   3.  Pt will perform LE dressing w/ prosthesis on I'ly w/ AE prn Baseline: dependent with prosthesis Goal status: MET   LONG TERM GOALS: Target date: 08/03/2021   Pt to perform dynamic standing tasks/IADLS using prosthesis (washing dishes, folding clothes, light cleaning) Baseline: DEPENDENT Goal status: ONGOING   2.  Pt to wear prosthesis at least 50% of the time during the day for ADLS/IADLS Baseline: Not currently wearing Goal status: ONGOING pt wearing approx 4 hours a day per report   3. Pt will simulate tub transfer with proper DME safety and  independently             Baseline: unable - does not have proper DME             Goal status: ONGOING - practiced in clinic w/o difficulty 06/16/21     ASSESSMENT:   CLINICAL IMPRESSION: Pt reports some increase in wearing time but difficulty with in/out of car with prosthesis.    PERFORMANCE DEFICITS in functional skills including ADLs, IADLs, strength, mobility, balance, body mechanics, endurance, and decreased knowledge of use of DME   IMPAIRMENTS are limiting patient from ADLs and IADLs.    COMORBIDITIES may have co-morbidities  that affects occupational performance. Patient will benefit from skilled OT to address above impairments and improve overall function.   MODIFICATION OR ASSISTANCE TO COMPLETE EVALUATION: No modification of tasks or assist necessary to complete an evaluation.   OT OCCUPATIONAL PROFILE AND HISTORY: Problem focused assessment: Including review of records relating to presenting problem.   CLINICAL DECISION MAKING: Moderate - several treatment options, min-mod task modification necessary   REHAB POTENTIAL: Good   EVALUATION COMPLEXITY: Low     PLAN: OT FREQUENCY: 2x/week   OT DURATION: 8 weeks (anticipate only 6 weeks)   PLANNED INTERVENTIONS: self care/ADL training, therapeutic exercise, therapeutic activity, neuromuscular re-education, functional mobility training, patient/family education, energy conservation, coping strategies training, and DME and/or AE instructions   RECOMMENDED OTHER SERVICES: none   CONSULTED AND AGREED WITH PLAN OF CARE: Patient   PLAN FOR NEXT SESSION: monitor BP, continue to educate and work on prosthesis wear time and tolerance, pt still waiting on tub bench - see progress with that     Managed medicaid CPT codes: [phone]- Therapeutic Exercise, [phone]- Neuro Re-education, [phone] - Therapeutic Activities, [phone] - [place], and [phone] - Prosthetic training      Zachery Conch, OT 07/09/2021, 1:15 PM

## 2021-07-09 NOTE — Telephone Encounter (Signed)
LCSW received a call from pt sister [place], [phone], DPR on file. [name] shares pt is doing well, has some adjustments to be made to his prosthetic but has been going to PT regularly and is overall making progress. Pt shares that they have a family member that is interested in applying for Medicaid, she had spoken with DSS and they "were requesting all this information like about her cars." Pt family member is a pt at cancer center. I shared that she should contact her oncology provider's office and request to speak with social work but that pt family member would need to provide all information requested by DSS and there wasn't a way for our team to request information about assets. However, should she want additional guidance or support they are welcome to call Cancer Center SW team. [facility] thanked this Clinical research associate for clarification and will pass that on to their family member.   [name], MSW, LCSW Clinical Social Worker II [facility] Navigation  [phone]- work cell phone (preferred) [phone]- desk phone

## 2021-07-09 NOTE — Therapy (Signed)
OUTPATIENT PHYSICAL THERAPY PROSTHETIC TREATMENT NOTE   Patient Name: Benjamin [name] MRN: [id number] DOB:[birth year], 61 y.o., male Today's Date: 07/09/2021  PCP: Arnette Felts, FNP REFERRING PROVIDER: Arnette Felts, FNP   PT End of Session - 07/09/21 1322     Visit Number 6    Number of Visits 16    Date for PT Re-Evaluation 08/03/21    Authorization Type [employer] (15 visits)    Progress Note Due on Visit 16    PT Start Time 1315    PT Stop Time 1400    PT Time Calculation (min) 45 min    Equipment Utilized During Treatment Gait belt    Activity Tolerance Patient tolerated treatment well    Behavior During Therapy [facility] for tasks assessed/performed             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 No sig Dz in infrarenal Abd Aorta. Normal Bilat Iliac arteries. R SFA is 100% @ origin - recon in AK-Pop A. R PT A patent. L CFA occluded. L PFA recon @ origin, L SFA occluded w/ recon in AK Pop A.   ABDOMINAL AORTOGRAM W/LOWER EXTREMITY N/A 10/15/2019   Procedure: ABDOMINAL AORTOGRAM W/LOWER EXTREMITY;  Surgeon: Nada Libman, MD;  Location: [facility];  Service: Cardiovascular;  Laterality: N/A;   AMPUTATION Left 10/04/2020   Procedure: LEFT ABOVE KNEE AMPUTATION;  Surgeon: Nada Libman, MD;  Location: [facility] OR;  Service: Vascular;  Laterality: Left;   AMPUTATION Left 12/08/2020   Procedure: RIGHT ABOVE KNEE AMPUTATION REVISION;  Surgeon: Chuck Hint, MD;  Location: [facility] OR;  Service:  Vascular;  Laterality: Left;   AORTA - BILATERAL FEMORAL ARTERY BYPASS GRAFT N/A 05/08/2020   Procedure: AORTA BIFEMORAL BYPASS GRAFT;  Surgeon: Nada Libman, MD;  Location: [facility] OR;  Service: Vascular;  Laterality: N/A;   COLONOSCOPY  06/2016   never   ENDARTERECTOMY FEMORAL Left 11/14/2019   Procedure: Left groin exploration, Redo left femoral artery exposure;  Surgeon: Nada Libman, MD;  Location: [facility] OR;  Service: Vascular;  Laterality: Left;   FEMORAL-FEMORAL BYPASS GRAFT N/A 11/13/2019   Procedure: BYPASS GRAFT FEMORAL-FEMORAL ARTERY RIGHT TO LEFT USING HEMASHIELD GOLD GRAFT 20mm x 30cm;  Surgeon: Nada Libman, MD;  Location: [facility] OR;  Service: Vascular;  Laterality: N/A;   FEMORAL-POPLITEAL BYPASS GRAFT Left 11/13/2019   Procedure: LEFT FEMORAL BELOW KNEE-POPLITEAL ARTERY USING NON-REVERSED GREATER SAPHENOUS VEIN;  Surgeon: Nada Libman, MD;  Location: [facility] OR;  Service: Vascular;  Laterality: Left;   FEMORAL-POPLITEAL BYPASS GRAFT Left 05/08/2020   Procedure: LEFT REDO BYPASS GRAFT COMMON FEMORAL- BELOW KNEE POPLITEAL ARTERY USING PROPATEN GRAFT;  Surgeon: Nada Libman, MD;  Location: [facility] OR;  Service: Vascular;  Laterality: Left;   I & D EXTREMITY Left 09/07/2020   Procedure: IRRIGATION AND DEBRIDEMENT LEFT LEG WITH EXCISION OF LEFT LEG DISTAL BYPASS GRAFT;  Surgeon: Cephus Shelling, MD;  Location: [facility] OR;  Service: Vascular;  Laterality: Left;  LOWER EXTREMITY ANGIOGRAM Left 11/14/2019   Procedure: LEFT LOWER EXTREMITY ANGIOGRAM, BYPASS GRAFT ANGIOPLASTY;  Surgeon: Nada Libman, MD;  Location: [facility] OR;  Service: Vascular;  Laterality: Left;   PERIPHERAL VASCULAR INTERVENTION  10/15/2019   Procedure: PERIPHERAL VASCULAR INTERVENTION;  Surgeon: Nada Libman, MD;  Location: [facility];  Service: Cardiovascular;;  Rt Iliac   REMOVAL OF GRAFT Left 10/04/2020   Procedure: REMOVAL OF LEFT FEMORAL TO POPITEAL BYPASS GRAFT;  Surgeon: Nada Libman, MD;  Location:  [facility] OR;  Service: Vascular;  Laterality: Left;   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   Unilateral AKA, left (HCC) 12/15/2020   Protein-calorie malnutrition, severe 12/09/2020   Wound cellulitis 12/07/2020   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/2020   Ischemia of lower extremity 10/01/2020   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   Femoral-popliteal bypass graft occlusion, 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   PAD (peripheral artery disease) (HCC) 11/13/2019   Callus of foot 07/01/2019   Intermittent claudication (HCC) 01/20/2017   Need for influenza vaccination 01/20/2017   Needs smoking cessation education 09/06/2016   Abnormal echocardiogram 09/05/2016   Abnormal EKG 07/25/2016   Preoperative cardiovascular examination 07/25/2016   Shortness of breath on exertion 07/25/2016   Dyslipidemia, goal LDL below 70 07/25/2016   High blood pressure 07/25/2016   Abnormal chest x-ray 07/04/2016   Peripheral vascular disease (HCC) 07/04/2016   Decreased pedal pulses 06/24/2016   Varicose vein of leg 06/24/2016   Heavy smoker 06/24/2016   Abnormal weight loss 06/24/2016   Foot pain, left 06/24/2016   Screening for prostate cancer 06/24/2016    ONSET DATE: 12/08/2020   REFERRING DIAG: D97.416L (ICD-10-CM) - Unilateral AKA, left (HCC)    THERAPY DIAG:  Other abnormalities of gait and mobility - Plan: PT plan of care cert/re-cert   Unsteadiness on feet - Plan: PT plan of care cert/re-cert   Hx of AKA (above knee amputation), left (HCC) - Plan: PT plan of care cert/re-cert   SUBJECTIVE:    SUBJECTIVE STATEMENT: I went to [employer] with prosthetic leg on. I didn't use any equipment but just walked around in gym and went to locker room to see if can walk around. I am going to [facility] to have the leg looked at because I feel it is still too long.Marland Kitchen    PERTINENT  HISTORY: PMH significant for HTN, HLD, heavy smoking, peripheral artery disease s/p bilateral femoral artery bypass graft    PAIN:  Are you having pain? No          PATIENT EDUCATION: Education details: Skin check, Correct ply sock adjustment, Propper donning, Propper doffing, Proper wear schedule/adjustment, and Proper weight-bearing schedule/adjustment Person educated: Patient Education method: Explanation, Demonstration, and Verbal cues Education comprehension: verbalized understanding, returned demonstration, verbal cues required, and needs further education  TODAY'S TREATMENT:    TREATMENT:  Pt had prosthetic leg on already from OT session. BP: 128/81  Gait training: 1 x 230' with RW and CGA, tactile cues for pelvis on R hip approximation during R stance phase to reduce R trendelenburg and improve L step length, cues at pelvis for forward translation during L stance phase to improve R step length to improve L knee extension in prosthetic drink. Pt still requires cueing for short left step length and longer R step length. Pt required decreasing cues compared to last session,  pt still requires cues to reduce WB through UE. Stair training: bil UE support, up with R LE and down with L LE, cues for anterior weight shift when coming down to lock left knee Called [facility] and left message with front desk for Kathlene NovemberMike to potentially look into locking mechanisms for L knee for safety with stairs. Ambulated with patient to his car from therapy gym as it was raining outside. Pt able to open car door and sit on the car seat. Pt educated to fold walker and put on driver side instead of in back seat. Pt demonstrated on how to lower his car seat to accommodate prosthetic leg under steering wheel as steering wheel was already at maximum height position. Worked on standing balance with cues to engage R hip with neutral extension to improve weight shift and locking of prosthetic knee on L side. 1'  Pt  educated not to use stairs at home until he is advised from therapy.   PATIENT EDUCATION: Education details: see above Person educated: Patient Education method: Explanation Education comprehension: verbalized understanding     HOME EXERCISE PROGRAM: Access Code: [email or web address] URL: [email or web address] Date: 07/07/2021 Prepared by: Harriet ButteEmily Parcell  Exercises - Lateral Weight Shift with Parallel Bars (AKA)  - 1 x daily - 7 x weekly - 3 sets - 10 reps - Lateral Weight Shift with Arm Raise and Walker  - 1 x daily - 7 x weekly - 3 sets - 10 reps - Alternating Step Forward with Support  - 1 x daily - 7 x weekly - 3 sets - 10 reps   ASSESSMENT:   CLINICAL IMPRESSION: Pt demonstrated improving step length today but he is inconsistent with his during the gait. Pt is able to verbalized that he needs to take longer R step length. Pt has trendelenburg on R which may be contributing to longer L step length. With tactile cues at hip/pelvis, patient was able to control step length better. Pt amy benefit from locking knee mechanism in prosthesis, especially with stairs, to improve safety.     OBJECTIVE IMPAIRMENTS Abnormal gait, cardiopulmonary status limiting activity, decreased activity tolerance, decreased balance, decreased endurance, decreased mobility, difficulty walking, decreased ROM, decreased strength, decreased safety awareness, hypomobility, increased fascial restrictions, impaired flexibility, improper body mechanics, postural dysfunction, prosthetic dependency , and pain.    ACTIVITY LIMITATIONS cleaning, community activity, laundry, and yard work.    PERSONAL FACTORS Past/current experiences, Time since onset of injury/illness/exacerbation, and 1-2 comorbidities: PAD, HTN, HLD  are also affecting patient's functional outcome.      REHAB POTENTIAL: Good   CLINICAL DECISION MAKING: Stable/uncomplicated   EVALUATION COMPLEXITY: Low     GOALS: Goals reviewed with  patient? Yes   SHORT TERM GOALS: Target date: 07/06/2021   Patient will be able to ambulate 230 feet with RW and SBA to improve functional mobility Baseline: 115' 06/08/21 Goal status: INITIAL   2.  Patient will be able to go up and down ramp and curb with RW and SBA to improve access to community. Baseline: not assessed 06/08/21 Goal status: INITIAL   3.  Patient will demo proper sock management to improve comfort, endurance and safety while using prosthesis with ADLs and functional walking. Baseline: Education initiated 06/08/21 Goal status: INITIAL     LONG TERM GOALS: Target date: 08/03/2021   Patient will be able to ambulate >500 feet in 6 minutes with RW to improve functional endurance with walking  Baseline: not assessed 06/08/21 Goal status: INITIAL   2.  Patient will demo gait speed of 0.30 m/s with RW to improve community ambulation. Baseline: 0.33m/s with RW 06/08/21 Goal status: INITIAL   3.  Patient will demo TUG score of <60 seconds with use of RW to improve functional mobility Baseline: 99 sec with RW Goal status: INITIAL   4.  Patient will be able to ambulate 230' with quad cane or LRAD to improve community mobility Baseline: 115' RW Goal status: INITIAL  5. Patient will demo >35/56 on BBS to improve functional standing balance. Baseline: 28/56 (07/09/21) Goal status: added on 07/09/21       PLAN: PT FREQUENCY: 2x/week   PT DURATION: 8 weeks   PLANNED INTERVENTIONS: Therapeutic exercises, Therapeutic activity, Neuromuscular re-education, Balance training, Gait training, Patient/Family education, Joint manipulation, Joint mobilization, Orthotic/Fit training, Prosthetic training, Cryotherapy, Moist heat, scar mobilization, and Manual therapy   PLAN FOR NEXT SESSION: Work on R closed chain stability on R hip during stance phase to improve step length on L, did pt see Kathlene November on 5/22 for re-check (locking mechanism for safety with stairs on prosthetic knee?), work on  standing balance, stairs   Ileana Ladd, PT 07/09/2021, 2:38 PM

## 2021-07-10 ENCOUNTER — Encounter (HOSPITAL_COMMUNITY): Payer: Self-pay

## 2021-07-10 ENCOUNTER — Ambulatory Visit (HOSPITAL_COMMUNITY)
Admission: EM | Admit: 2021-07-10 | Discharge: 2021-07-10 | Disposition: A | Discharge status: Home / Self Care | Payer: Medicaid Other | Attending: Physician Assistant | Admitting: Physician Assistant

## 2021-07-10 DIAGNOSIS — H9313 Tinnitus, bilateral: Secondary | ICD-10-CM

## 2021-07-10 DIAGNOSIS — I10 Essential (primary) hypertension: Secondary | ICD-10-CM | POA: Diagnosis not present

## 2021-07-10 MED ORDER — AMLODIPINE BESYLATE 2.5 MG PO TABS: 2.5000 mg | ORAL_TABLET | Freq: Every day | ORAL | 1 refills | Status: DC

## 2021-07-10 MED ORDER — AMLODIPINE BESYLATE 2.5 MG PO TABS
2.5000 mg | ORAL_TABLET | Freq: Every day | ORAL | 1 refills | Status: DC
  Filled 2021-07-10: qty 30, 30d supply, fill #0

## 2021-07-10 NOTE — ED Provider Notes (Signed)
[facility]    CSN: [id number] Arrival date & time: 07/10/21  1243      History   Chief Complaint Chief Complaint  Patient presents with   Hypertension    HPI Benjamin [name] is a 61 y.o. male.   HPI  Patient presents today for follow-up of labile blood pressures.  He was last seen by our clinic on 06/27/2021 at which point he had been seen in the emergency room several times prior to this due to intermittently elevated blood pressure.  At that time his blood pressure had normalized and he was encouraged to monitor this closely after replacing his blood pressure cuff.  He has since established with primary care and is currently on carvedilol 6.25 mg twice daily and losartan 50 mg twice daily with clonidine patch.  He reports taking medication as prescribed without missing doses or noted side effects.  Reports that he will occasionally still have elevated blood pressure readings particularly at night that tends to exacerbate associated tinnitus.  He is requesting blood pressure medication adjustment if appropriate today.  Denies any chest pain, shortness of breath, headache, vision change, dizziness.  He does not take NSAIDs, decongestants, caffeine, significant sodium consumption.  He is scheduled to see PCP within the next few weeks.  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   Femoral-popliteal  bypass graft occlusion, 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   PAD (peripheral artery disease) (HCC) 11/13/2019   Callus of foot 07/01/2019   Intermittent claudication (HCC) 01/20/2017   Need for influenza vaccination 01/20/2017   Needs smoking cessation education 09/06/2016   Abnormal echocardiogram 09/05/2016   Abnormal EKG 07/25/2016   Preoperative cardiovascular examination 07/25/2016   Shortness of breath on exertion 07/25/2016   Dyslipidemia, goal LDL below 70 07/25/2016   High blood pressure 07/25/2016   Abnormal chest x-ray 07/04/2016   Peripheral vascular disease (HCC) 07/04/2016   Decreased pedal pulses 06/24/2016   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 No sig Dz in infrarenal Abd Aorta. Normal Bilat Iliac arteries. R SFA is 100% @ origin - recon in AK-Pop A. R PT A patent. L CFA occluded. L PFA recon @ origin, L SFA occluded w/ recon in AK Pop A.   ABDOMINAL AORTOGRAM W/LOWER EXTREMITY N/A 10/15/2019   Procedure: ABDOMINAL AORTOGRAM W/LOWER EXTREMITY;  Surgeon: Nada Libman, MD;  [facility];  Service: Cardiovascular;  Laterality: N/A;   AMPUTATION Left 10/04/2020   Procedure: LEFT ABOVE  KNEE AMPUTATION;  Surgeon: Nada Libman, MD;  Location: [facility] OR;  Service: Vascular;  Laterality: Left;   AMPUTATION Left 12/08/2020   Procedure: RIGHT ABOVE KNEE AMPUTATION REVISION;  Surgeon: Chuck Hint, MD;  Location: [facility] OR;  Service: Vascular;  Laterality: Left;   AORTA - BILATERAL FEMORAL ARTERY BYPASS GRAFT N/A 05/08/2020   Procedure: AORTA  BIFEMORAL BYPASS GRAFT;  Surgeon: Nada Libman, MD;  Location: [facility];  Service: Vascular;  Laterality: N/A;   COLONOSCOPY  06/2016   never   ENDARTERECTOMY FEMORAL Left 11/14/2019   Procedure: Left groin exploration, Redo left femoral artery exposure;  Surgeon: Nada Libman, MD;  Location: [facility];  Service: Vascular;  Laterality: Left;   FEMORAL-FEMORAL BYPASS GRAFT N/A 11/13/2019   Procedure: BYPASS GRAFT FEMORAL-FEMORAL ARTERY RIGHT TO LEFT USING HEMASHIELD GOLD GRAFT 8mm x 30cm;  Surgeon: Nada Libman, MD;  Location: [facility] OR;  Service: Vascular;  Laterality: N/A;   FEMORAL-POPLITEAL BYPASS GRAFT Left 11/13/2019   Procedure: LEFT FEMORAL BELOW KNEE-POPLITEAL ARTERY USING NON-REVERSED GREATER SAPHENOUS VEIN;  Surgeon: Nada Libman, MD;  Location: [facility];  Service: Vascular;  Laterality: Left;   FEMORAL-POPLITEAL BYPASS GRAFT Left 05/08/2020   Procedure: LEFT REDO BYPASS GRAFT COMMON FEMORAL- BELOW KNEE POPLITEAL ARTERY USING PROPATEN GRAFT;  Surgeon: Nada Libman, MD;  Location: [facility];  Service: Vascular;  Laterality: Left;   I & D EXTREMITY Left 09/07/2020   Procedure: IRRIGATION AND DEBRIDEMENT LEFT LEG WITH EXCISION OF LEFT LEG DISTAL BYPASS GRAFT;  Surgeon: Cephus Shelling, MD;  Location: [facility];  Service: Vascular;  Laterality: Left;   LOWER EXTREMITY ANGIOGRAM Left 11/14/2019   Procedure: LEFT LOWER EXTREMITY ANGIOGRAM, BYPASS GRAFT ANGIOPLASTY;  Surgeon: Nada Libman, MD;  Location: [facility];  Service: Vascular;  Laterality: Left;   PERIPHERAL VASCULAR INTERVENTION  10/15/2019   Procedure: PERIPHERAL VASCULAR INTERVENTION;  Surgeon: Nada Libman, MD;  [facility];  Service: Cardiovascular;;  Rt Iliac   REMOVAL OF GRAFT Left 10/04/2020   Procedure: REMOVAL OF LEFT FEMORAL TO POPITEAL BYPASS GRAFT;  Surgeon: Nada Libman, MD;  Location: [facility];  Service: Vascular;  Laterality: Left;       Home Medications    Prior to Admission medications    Medication Sig Start Date End Date Taking? Authorizing Provider  amLODipine (NORVASC) 2.5 MG tablet Take 1 tablet (2.5 mg total) by mouth daily. 07/10/21  Yes Majesty Oehlert, Noberto Retort, PA-C  acetaminophen (TYLENOL) 325 MG tablet Take 1-2 tablets (325-650 mg total) by mouth every 4 (four) hours as needed for mild pain. Patient not taking: Reported on 06/22/2021 12/25/20   Love, Evlyn Kanner, PA-C  albuterol (VENTOLIN HFA) 108 (90 Base) MCG/ACT inhaler Inhale 1-2 puffs into the lungs every 6 (six) hours as needed for wheezing or shortness of breath. 06/12/21   Tanda Rockers A, DO  atorvastatin (LIPITOR) 20 MG tablet Take 1 tablet (20 mg total) by mouth at bedtime. 06/28/21   Arnette Felts, FNP  carvedilol (COREG) 6.25 MG tablet Take 1 tablet (6.25 mg total) by mouth 2 (two) times daily with a meal. 07/05/21 09/03/21  Raulkar, Drema Pry, MD  cloNIDine (CATAPRES - DOSED IN MG/24 HR) 0.1 mg/24hr patch Place 1 patch (0.1 mg total) onto the skin once a week. 07/05/21   Raulkar, Drema Pry, MD  clopidogrel (PLAVIX) 75 MG tablet Take 1 tablet (75 mg total) by mouth daily. 05/11/21     docusate sodium (COLACE) 100 MG  capsule Take 1 capsule (100 mg total) by mouth daily. Patient not taking: Reported on 06/22/2021 03/16/21   Horton Chinaulkar, Krutika P, MD  gabapentin (NEURONTIN) 600 MG tablet Take 1 tablet (600 mg total) by mouth 2 (two) times daily. 03/22/21   Raulkar, Drema PryKrutika P, MD  guaiFENesin-dextromethorphan (ROBITUSSIN DM) 100-10 MG/5ML syrup Take 15 mLs by mouth every 4 (four) hours as needed for cough. Patient not taking: Reported on 06/22/2021 12/15/20   Lorin Glassahal, Binaya, MD  losartan (COZAAR) 50 MG tablet Take 1 tablet (50 mg total) by mouth in the morning and at bedtime. 07/05/21   Raulkar, Drema PryKrutika P, MD  methocarbamol (ROBAXIN) 500 MG tablet Take 1 tablet (500 mg total) by mouth every 6 (six) hours as needed for muscle spasms. Patient not taking: Reported on 06/22/2021 12/25/20   Jacquelynn CreeLove, Pamela S, PA-C  Multiple Vitamin (MULTIVITAMIN WITH  MINERALS) TABS tablet Take 1 tablet by mouth daily. Patient not taking: Reported on 06/22/2021 12/16/20   Lorin Glassahal, Binaya, MD  NON FORMULARY 1-5 drops See admin instructions. Unnamed tinnitus relief ear drops- Instill 1-5 drops into the affected ear(s) two times a day as needed for tinnitus    [provider]  nortriptyline (PAMELOR) 50 MG capsule Take 2 capsules (100 mg total) by mouth at bedtime. 06/30/21   Al DecantGroce, Christopher F, PA-C  senna (SENOKOT) 8.6 MG TABS tablet Take 1 tablet (8.6 mg total) by mouth 2 (two) times daily. Patient not taking: Reported on 06/22/2021 12/25/20   Love, Evlyn KannerPamela S, PA-C  Vitamins-Lipotropics (LIPO-FLAVONOID PLUS PO) Take 1 capsule by mouth 2 (two) times daily as needed (for tinnitus).    [provider]    Family History Family History  Problem Relation Age of Onset   Arthritis Mother    COPD Father    Stroke Father    Multiple sclerosis Sister     Social History Social History   Tobacco Use   Smoking status: Former    Packs/day: 1.50    Years: 29.00    Pack years: 43.50    Types: Cigarettes    Quit date: 10/22/2020    Years since quitting: 0.7   Smokeless tobacco: Never   Tobacco comments:    Quit smoking September 2022.  Using Nicoderm patch currently.  Vaping Use   Vaping Use: Never used  Substance Use Topics   Alcohol use: No   Drug use: No     Allergies   Gabapentin, Tetanus toxoids, Aspirin, and Garlic   Review of Systems Review of Systems  Constitutional:  Positive for activity change. Negative for appetite change, fatigue and fever.  HENT:  Positive for tinnitus (Intermittently at night). Negative for congestion, ear discharge and ear pain.   Eyes:  Negative for visual disturbance.  Respiratory:  Negative for cough and shortness of breath.   Cardiovascular:  Negative for chest pain.  Gastrointestinal:  Negative for abdominal pain, diarrhea, nausea and vomiting.  Neurological:  Negative for dizziness, light-headedness  and headaches.    Physical Exam Triage Vital Signs ED Triage Vitals  Enc Vitals Group     BP 07/10/21 1411 (!) 150/81     Pulse --      Resp 07/10/21 1328 16     Temp 07/10/21 1328 98 F (36.7 C)     Temp Source 07/10/21 1328 Oral     SpO2 07/10/21 1328 100 %     Weight --      Height --      Head Circumference --  Peak Flow --      Pain Score --      Pain Loc --      Pain Edu? --      Excl. in GC? --    No data found.  Updated Vital Signs BP (!) 150/81 (BP Location: Left Arm)   Temp 98 F (36.7 C) (Oral)   Resp 16   SpO2 100%   Visual Acuity Right Eye Distance:   Left Eye Distance:   Bilateral Distance:    Right Eye Near:   Left Eye Near:    Bilateral Near:     Physical Exam Vitals reviewed.  Constitutional:      General: He is awake.     Appearance: Normal appearance. He is well-developed. He is not ill-appearing.     Comments: Very pleasant male appears stated age no acute distress sitting comfortably on exam room table  HENT:     Head: Normocephalic and atraumatic.     Right Ear: Tympanic membrane, ear canal and external ear normal.     Left Ear: Tympanic membrane, ear canal and external ear normal.     Mouth/Throat:     Pharynx: Uvula midline. No oropharyngeal exudate or posterior oropharyngeal erythema.  Cardiovascular:     Rate and Rhythm: Normal rate and regular rhythm.     Heart sounds: Normal heart sounds, S1 normal and S2 normal. No murmur heard. Pulmonary:     Effort: Pulmonary effort is normal.     Breath sounds: Normal breath sounds. No stridor. No wheezing, rhonchi or rales.     Comments: Clear to auscultation bilaterally Abdominal:     General: Bowel sounds are normal.     Palpations: Abdomen is soft.     Tenderness: There is no abdominal tenderness.  Musculoskeletal:     Comments: Left AKA  Neurological:     Mental Status: He is alert.  Psychiatric:        Behavior: Behavior is cooperative.     UC Treatments / Results   Labs (all labs ordered are listed, but only abnormal results are displayed) Labs Reviewed - No data to display  EKG   Radiology No results found.  Procedures Procedures (including critical care time)  Medications Ordered in UC Medications - No data to display  Initial Impression / Assessment and Plan / UC Course  I have reviewed the triage vital signs and the nursing notes.  Pertinent labs & imaging results that were available during my care of the patient were reviewed by me and considered in my medical decision making (see chart for details).     Patient denies any signs/symptoms of endorgan damage at this time.  He expresses anxiety regarding persistently elevated blood pressure.  He reports compliance with currently prescribed medications and was encouraged to continue taking them as previously prescribed.  We will add low-dose amlodipine 2.5 mg nightly in the hopes of adequate blood pressure control.  Discussed potential need to change this medication dose based on ongoing blood pressure readings.  He is to avoid NSAIDs, caffeine, sodium, decongestants. Recommend he continue monitoring his blood pressure at home and keep a log for evaluation of follow-up appointment.  Someone is to monitor his blood pressure within the next 1 to 2 weeks and if he is unable to see his primary care provider in this timeframe he is to return to our clinic.  Patient is already established with ENT and has appointment scheduled to investigate tinnitus.  Discussed that if anything  worsens and he has high blood pressure with associated chest pain, shortness of breath, headaches, dizziness, nausea/vomiting, focal weakness, vision change he is to be seen immediately to which he expressed understanding.  Final Clinical Impressions(s) / UC Diagnoses   Final diagnoses:  Elevated blood pressure reading with diagnosis of hypertension  Tinnitus of both ears     Discharge Instructions      Start amlodipine  at night.  Monitor your blood pressure at home.  If you develop any chest pain, shortness of breath, headache, vision change, dizziness in the setting of high blood pressure you need to go to the emergency room.  Follow-up with your primary care as scheduled.  You can return here if you need to be reevaluated before your scheduled appointment.  Keep your appointment with ENT to investigate ongoing ringing in your ear as we discussed.     ED Prescriptions     Medication Sig Dispense Auth. Provider   amLODipine (NORVASC) 2.5 MG tablet Take 1 tablet (2.5 mg total) by mouth daily. 30 tablet Ramir Malerba, Noberto Retort, PA-C      PDMP not reviewed this encounter.   Jeani Hawking, PA-C 07/10/21 1431

## 2021-07-10 NOTE — Discharge Instructions (Signed)
Start amlodipine at night.  Monitor your blood pressure at home.  If you develop any chest pain, shortness of breath, headache, vision change, dizziness in the setting of high blood pressure you need to go to the emergency room.  Follow-up with your primary care as scheduled.  You can return here if you need to be reevaluated before your scheduled appointment.  Keep your appointment with ENT to investigate ongoing ringing in your ear as we discussed.

## 2021-07-10 NOTE — ED Triage Notes (Signed)
Pt reports having a headache for several days and tylenol is not working. He reports ringing in his ears.

## 2021-07-10 NOTE — ED Triage Notes (Signed)
Pt is here for refill on his blood pressure medications.

## 2021-07-12 ENCOUNTER — Other Ambulatory Visit: Payer: Self-pay

## 2021-07-13 ENCOUNTER — Ambulatory Visit (HOSPITAL_COMMUNITY)
Admission: EM | Admit: 2021-07-13 | Discharge: 2021-07-13 | Disposition: A | Discharge status: Nursing Home (Includes ICF) | Payer: Medicaid Other | Attending: Family Medicine | Admitting: Family Medicine

## 2021-07-13 ENCOUNTER — Other Ambulatory Visit: Payer: Self-pay

## 2021-07-13 ENCOUNTER — Encounter (HOSPITAL_COMMUNITY): Payer: Self-pay | Admitting: Emergency Medicine

## 2021-07-13 ENCOUNTER — Emergency Department (HOSPITAL_COMMUNITY)
Admission: EM | Admit: 2021-07-13 | Discharge: 2021-07-13 | Disposition: A | Discharge status: Home / Self Care | Payer: Medicaid Other | Attending: Emergency Medicine | Admitting: Emergency Medicine

## 2021-07-13 ENCOUNTER — Emergency Department (HOSPITAL_COMMUNITY): Payer: Medicaid Other

## 2021-07-13 DIAGNOSIS — Z79899 Other long term (current) drug therapy: Secondary | ICD-10-CM | POA: Insufficient documentation

## 2021-07-13 DIAGNOSIS — I10 Essential (primary) hypertension: Secondary | ICD-10-CM | POA: Insufficient documentation

## 2021-07-13 DIAGNOSIS — R079 Chest pain, unspecified: Secondary | ICD-10-CM | POA: Diagnosis not present

## 2021-07-13 DIAGNOSIS — Z7902 Long term (current) use of antithrombotics/antiplatelets: Secondary | ICD-10-CM | POA: Diagnosis not present

## 2021-07-13 LAB — BASIC METABOLIC PANEL
Anion gap: 6 (ref 5–15)
BUN: 9 mg/dL (ref 8–23)
CO2: 24 mmol/L (ref 22–32)
Calcium: 8.8 mg/dL — ABNORMAL LOW (ref 8.9–10.3)
Chloride: 107 mmol/L (ref 98–111)
Creatinine, Ser: 0.98 mg/dL (ref 0.61–1.24)
GFR, Estimated: >60 mL/min (ref >60)
Glucose, Bld: 116 mg/dL — ABNORMAL HIGH (ref 70–99)
Potassium: 3.9 mmol/L (ref 3.5–5.1)
Sodium: 137 mmol/L (ref 135–145)

## 2021-07-13 LAB — CBC
HCT: 36.7 % — ABNORMAL LOW (ref 39.0–52.0)
Hemoglobin: 11.5 g/dL — ABNORMAL LOW (ref 13.0–17.0)
MCH: 22.1 pg — ABNORMAL LOW (ref 26.0–34.0)
MCHC: 31.3 g/dL (ref 30.0–36.0)
MCV: 70.6 fL — ABNORMAL LOW (ref 80.0–100.0)
Platelets: 368 10*3/uL (ref 150–400)
RBC: 5.2 MIL/uL (ref 4.22–5.81)
RDW: 18.9 % — ABNORMAL HIGH (ref 11.5–15.5)
WBC: 6.8 10*3/uL (ref 4.0–10.5)
nRBC: 0 % (ref 0.0–0.2)

## 2021-07-13 LAB — TROPONIN I (HIGH SENSITIVITY)
Troponin I (High Sensitivity): 5 ng/L (ref <18)
Troponin I (High Sensitivity): 6 ng/L (ref <18)

## 2021-07-13 NOTE — ED Triage Notes (Signed)
C/O substernal chest "tightness" intermittently onset @ 1400 today. States deep breathing and deep palpation can make the tightness feel better. Also c/o left ear tinnitus intermittently.

## 2021-07-13 NOTE — ED Provider Notes (Signed)
[name] EMERGENCY DEPARTMENT Provider Note   CSN: [id number] Arrival date & time: 07/13/21  1707     History {Add pertinent medical, surgical, social history, OB history to HPI:1} Chief Complaint  Patient presents with   Hypertension    Benjamin [name] is a 61 y.o. male.   Hypertension      Home Medications Prior to Admission medications   Medication Sig Start Date End Date Taking? Authorizing Provider  acetaminophen (TYLENOL) 325 MG tablet Take 1-2 tablets (325-650 mg total) by mouth every 4 (four) hours as needed for mild pain. Patient not taking: Reported on 06/22/2021 12/25/20   Love, Evlyn Kanner, PA-C  albuterol (VENTOLIN HFA) 108 (90 Base) MCG/ACT inhaler Inhale 1-2 puffs into the lungs every 6 (six) hours as needed for wheezing or shortness of breath. 06/12/21   Tanda Rockers A, DO  amLODipine (NORVASC) 2.5 MG tablet Take 1 tablet (2.5 mg total) by mouth daily. 07/10/21   Raspet, Noberto Retort, PA-C  atorvastatin (LIPITOR) 20 MG tablet Take 1 tablet (20 mg total) by mouth at bedtime. 06/28/21   Arnette Felts, FNP  carvedilol (COREG) 6.25 MG tablet Take 1 tablet (6.25 mg total) by mouth 2 (two) times daily with a meal. 07/05/21 09/03/21  Raulkar, Drema Pry, MD  cloNIDine (CATAPRES - DOSED IN MG/24 HR) 0.1 mg/24hr patch Place 1 patch (0.1 mg total) onto the skin once a week. 07/05/21   Raulkar, Drema Pry, MD  clopidogrel (PLAVIX) 75 MG tablet Take 1 tablet (75 mg total) by mouth daily. 05/11/21     docusate sodium (COLACE) 100 MG capsule Take 1 capsule (100 mg total) by mouth daily. Patient not taking: Reported on 06/22/2021 03/16/21   Horton Chin, MD  gabapentin (NEURONTIN) 600 MG tablet Take 1 tablet (600 mg total) by mouth 2 (two) times daily. 03/22/21   Raulkar, Drema Pry, MD  guaiFENesin-dextromethorphan (ROBITUSSIN DM) 100-10 MG/5ML syrup Take 15 mLs by mouth every 4 (four) hours as needed for cough. Patient not taking: Reported on 06/22/2021 12/15/20   Lorin Glass,  MD  losartan (COZAAR) 50 MG tablet Take 1 tablet (50 mg total) by mouth in the morning and at bedtime. 07/05/21   Raulkar, Drema Pry, MD  methocarbamol (ROBAXIN) 500 MG tablet Take 1 tablet (500 mg total) by mouth every 6 (six) hours as needed for muscle spasms. Patient not taking: Reported on 06/22/2021 12/25/20   Jacquelynn Cree, PA-C  Multiple Vitamin (MULTIVITAMIN WITH MINERALS) TABS tablet Take 1 tablet by mouth daily. Patient not taking: Reported on 06/22/2021 12/16/20   Lorin Glass, MD  NON FORMULARY 1-5 drops See admin instructions. Unnamed tinnitus relief ear drops- Instill 1-5 drops into the affected ear(s) two times a day as needed for tinnitus    [provider]  nortriptyline (PAMELOR) 50 MG capsule Take 2 capsules (100 mg total) by mouth at bedtime. 06/30/21   Al Decant, PA-C  senna (SENOKOT) 8.6 MG TABS tablet Take 1 tablet (8.6 mg total) by mouth 2 (two) times daily. Patient not taking: Reported on 06/22/2021 12/25/20   Love, Evlyn Kanner, PA-C  Vitamins-Lipotropics (LIPO-FLAVONOID PLUS PO) Take 1 capsule by mouth 2 (two) times daily as needed (for tinnitus).    [provider]      Allergies    Gabapentin, Tetanus toxoids, Aspirin, and Garlic    Review of Systems   Review of Systems  Physical Exam Updated Vital Signs BP (!) 156/99 (BP Location: Right Arm)  Pulse 61   Temp 97.7 F (36.5 C) (Oral)   Resp 17   SpO2 97%  Physical Exam  ED Results / Procedures / Treatments   Labs (all labs ordered are listed, but only abnormal results are displayed) Labs Reviewed  BASIC METABOLIC PANEL - Abnormal; Notable for the following components:      Result Value   Glucose, Bld 116 (*)    Calcium 8.8 (*)    All other components within normal limits  CBC - Abnormal; Notable for the following components:   Hemoglobin 11.5 (*)    HCT 36.7 (*)    MCV 70.6 (*)    MCH 22.1 (*)    RDW 18.9 (*)    All other components within normal limits  TROPONIN I (HIGH  SENSITIVITY)  TROPONIN I (HIGH SENSITIVITY)    EKG None  Radiology DG Chest 2 View  Result Date: 07/13/2021 CLINICAL DATA:  chest pain EXAM: CHEST - 2 VIEW COMPARISON:  Jul 03, 2021 FINDINGS: The heart size and mediastinal contours are within normal limits. Both lungs are clear. Moderate thoracic spondylosis. IMPRESSION: No active cardiopulmonary disease. Electronically Signed   By: Marjo Bicker M.D.   On: 07/13/2021 18:10    Procedures Procedures  {Document cardiac monitor, telemetry assessment procedure when appropriate:1}  Medications Ordered in ED Medications - No data to display  ED Course/ Medical Decision Making/ A&P                           Medical Decision Making Amount and/or Complexity of Data Reviewed Labs: ordered. Radiology: ordered.   ***  {Document critical care time when appropriate:1} {Document review of labs and clinical decision tools ie heart score, Chads2Vasc2 etc:1}  {Document your independent review of radiology images, and any outside records:1} {Document your discussion with family members, caretakers, and with consultants:1} {Document social determinants of health affecting pt's care:1} {Document your decision making why or why not admission, treatments were needed:1} Final Clinical Impression(s) / ED Diagnoses Final diagnoses:  None    Rx / DC Orders ED Discharge Orders     None

## 2021-07-13 NOTE — Discharge Instructions (Signed)
Please call your PCP tomorrow to discuss chronic management of your blood pressure.  There is no indication of need for acute hospitalization related to your blood pressure at this time

## 2021-07-13 NOTE — ED Triage Notes (Signed)
Patient complains of hypertension at night despite taking antihypertensives in the morning. Patient reports tinnitus and chest tightness that occurs regularly at night since early 2022. Patient is alert, oriented, and in no apparent distress at this time.

## 2021-07-13 NOTE — ED Notes (Addendum)
Patient is being discharged from the Urgent Care and sent to the Emergency Department via private vehicle . Per Nyoka Lint, PA, patient is in need of higher level of care due to chest tightness and need for cardiac work-up. Patient is aware and verbalizes understanding of plan of care.  Vitals:   07/13/21 1649  BP: (!) 152/94  Pulse: 65  Resp: 20  SpO2: 97%

## 2021-07-13 NOTE — ED Notes (Signed)
DC instructions reviewed with pt. PT verbalized understanding. PT DC °

## 2021-07-13 NOTE — ED Provider Triage Note (Signed)
Emergency Medicine Provider Triage Evaluation Note  Benjamin [name] , a 61 y.o. male  was evaluated in triage.  Pt complains of chest pain, ringing in left ear and blurry vision.  Symptoms have been nightly for the past year.  He has been compliant with his carvedilol, losartan and amlodipine but continues to be hypertensive to 170 at home.  States that his symptoms improved throughout the day and worsen at night.  States that he is currently asymptomatic as his symptoms have gradually improved over the past 3 hours.  He was sent to the ER to "check my heart out."  Review of Systems  Positive: Nightly chest pain, blurry vision and left sided tinnitus Negative: Shortness of breath  Physical Exam  BP (!) 151/102 (BP Location: Left Arm)   Pulse 68   Temp 98.3 F (36.8 C)   Resp 18   SpO2 97%  Gen:   Awake, no distress   Resp:  Normal effort  MSK:   Moves extremities without difficulty  Other:  Speaking complete sentence without difficulty, no aphasia, no facial asymmetry  Medical Decision Making  Medically screening exam initiated at 5:44 PM.  Appropriate orders placed.  [name] was informed that the remainder of the evaluation will be completed by another provider, this initial triage assessment does not replace that evaluation, and the importance of remaining in the ED until their evaluation is complete.  Labs and EKG ordered   Dietrich Pates, [place] 07/13/21 1746

## 2021-07-14 ENCOUNTER — Ambulatory Visit: Payer: Medicaid Other | Admitting: Occupational Therapy

## 2021-07-14 ENCOUNTER — Other Ambulatory Visit: Payer: Self-pay

## 2021-07-14 ENCOUNTER — Telehealth: Payer: Self-pay | Admitting: Nurse Practitioner

## 2021-07-14 ENCOUNTER — Ambulatory Visit: Payer: Medicaid Other

## 2021-07-14 ENCOUNTER — Other Ambulatory Visit: Payer: Self-pay | Admitting: Nurse Practitioner

## 2021-07-14 MED ORDER — ISOSORBIDE MONONITRATE ER 30 MG PO TB24
30.0000 mg | ORAL_TABLET | Freq: Every day | ORAL | 4 refills | Status: AC
  Filled 2021-07-14 – 2022-01-18 (×2): qty 30, 30d supply, fill #0

## 2021-07-14 NOTE — Telephone Encounter (Signed)
Called Marisue Humble [facility] Case Manager to discuss the patient. He came to the office this morning verbalizing he is interested in going to a nursing home. He had an ER visit yesterday for Hypertension, chest pain and ringing in his ears. He has had an ER visit 3 times in the last week. Marisue Humble will make the SW aware to have the discussion and start the process. I am unsure that he truly comprehends the directions he is given.

## 2021-07-15 ENCOUNTER — Other Ambulatory Visit: Payer: Self-pay

## 2021-07-15 NOTE — Patient Outreach (Signed)
Care Coordination  07/15/2021  Benjamin [name] [birth year] [id number]  07/15/2021 Name: Benjamin [name] MRN: [id number] DOB: 04-28-1960  Referred by: Benjamin Felts, FNP Reason for referral : High Risk Managed Medicaid (RNCM - Notification of Case Closure: patient joined [employer] on 07/14/2021)   This RN Care Manager had a successful telephone call with both the patient and his primary caregiver, Benjamin [name] (sister), today.  Benjamin [name] stated that he has enrolled with [employer] and had initial new PCP office visit yesterday.  Benjamin [name] and [facility] excited about meeting new PCP yesterday and the in-home services available to Kerry, such as a nurse who can come to the home to help with medication management.  I informed the patient and his sister that the [facility] High Risk Managed Medicaid team will need to close our case since patient has enrolled with [employer] that offers Case Management services for Benjamin [name].  Patient and sister verbalized understanding and expressed appreciation for [facility] assistance with Zethan's care.  Follow Up Plan:  This RN Care Manager will notify PCP, Benjamin Felts FNP, and fellow [facility] team members involved in care, of case closure effective 07/15/2021.   Virgina Norfolk RN, BSN Community Care Coordinator [facility]  [facility] Mobile: [phone]

## 2021-07-15 NOTE — Patient Instructions (Signed)
Benjamin [name] ,   The High Risk Medicaid Managed Care Team is available to provide assistance to you with your healthcare needs at no cost and as a benefit of your [facility] plan.   This is notification of the [facility] High Risk Managed Medicaid Case Closure due to patient enrolling with [employer] for Chronic Disease Management.  The Case Closure is effective 07/15/2021.  The [facility] [employer] care management team is available to assist with your healthcare needs at any time. Please do not hesitate to contact me at the number below. .   Thank you,   Virgina Norfolk RN, BSN [facility] Coordinator [facility]  [facility] Mobile: [phone]

## 2021-07-16 ENCOUNTER — Ambulatory Visit (HOSPITAL_COMMUNITY)
Admission: EM | Admit: 2021-07-16 | Discharge: 2021-07-16 | Disposition: A | Discharge status: Home / Self Care | Payer: Medicaid Other | Attending: Emergency Medicine | Admitting: Emergency Medicine

## 2021-07-16 ENCOUNTER — Encounter (HOSPITAL_COMMUNITY): Payer: Self-pay

## 2021-07-16 ENCOUNTER — Ambulatory Visit: Payer: Medicaid Other | Admitting: Physical Therapy

## 2021-07-16 ENCOUNTER — Encounter: Payer: Self-pay | Admitting: Physical Therapy

## 2021-07-16 VITALS — BP 127/92 | HR 91

## 2021-07-16 DIAGNOSIS — R293 Abnormal posture: Secondary | ICD-10-CM

## 2021-07-16 DIAGNOSIS — R2681 Unsteadiness on feet: Secondary | ICD-10-CM

## 2021-07-16 DIAGNOSIS — I10 Essential (primary) hypertension: Secondary | ICD-10-CM | POA: Diagnosis not present

## 2021-07-16 DIAGNOSIS — M6281 Muscle weakness (generalized): Secondary | ICD-10-CM

## 2021-07-16 DIAGNOSIS — R2689 Other abnormalities of gait and mobility: Secondary | ICD-10-CM

## 2021-07-16 NOTE — Discharge Instructions (Signed)
Your blood pressure is with and the goal range today in office at 137/86  Your EKG shows a normal rhythm and pace with no changes compared to the EKG taken 3 days ago  As your medication was just changed on 07/10/2021 with the addition of amlodipine I do not want to make any additional changes so that your body may properly adjust  Continue to take your medicine daily as prescribed  Continue to check your blood pressure once daily and record  Make an appointment with your primary doctor for a reevaluation in 2 weeks  You have been given a referral to heart care which is our cardiologist group, you may call and make an appointment for further evaluation and work-up for your heart  At any point if your blood pressure is elevated to 170s/100s or greater and you have any of the following symptoms headache, dizziness, lightheadedness, visual changes, chest pain or shortness of breath please go to the nearest emergency department

## 2021-07-16 NOTE — ED Triage Notes (Addendum)
Pt presents with complaints of blood pressure being elevated last night and having a mild headache with blurry vision. Reports taking medication as prescribed.

## 2021-07-16 NOTE — Therapy (Signed)
OUTPATIENT PHYSICAL THERAPY PROSTHETIC TREATMENT NOTE   Patient Name: Benjamin [name] MRN: [id number] DOB:11-14-1960, 61 y.o., male Today's Date: 07/16/2021  PCP: Levonne Lapping, NP REFERRING PROVIDER: Arnette Felts, FNP   PT End of Session - 07/16/21 1235     Visit Number 7    Number of Visits 16    Date for PT Re-Evaluation 08/03/21    Authorization Type [employer] (15 visits)    Authorization Time Period 10 vists from 4/2- 07/23/21    Authorization - Visit Number 6    Authorization - Number of Visits 10    Progress Note Due on Visit 16    PT Start Time 1232    PT Stop Time 1314    PT Time Calculation (min) 42 min    Equipment Utilized During Treatment Gait belt    Activity Tolerance Patient tolerated treatment well    Behavior During Therapy WFL for tasks assessed/performed             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 No sig Dz in infrarenal Abd Aorta. Normal Bilat Iliac arteries. R SFA is 100% @ origin - recon in AK-Pop A. R PT A patent. L CFA occluded. L PFA recon @ origin, L SFA occluded w/ recon in AK Pop A.   ABDOMINAL AORTOGRAM W/LOWER EXTREMITY N/A 10/15/2019   Procedure: ABDOMINAL AORTOGRAM W/LOWER EXTREMITY;  Surgeon: Nada Libman, MD;  Location: [facility];  Service: Cardiovascular;  Laterality: N/A;   AMPUTATION Left 10/04/2020   Procedure: LEFT ABOVE KNEE AMPUTATION;  Surgeon: Nada Libman, MD;  Location: [facility] OR;  Service: Vascular;  Laterality: Left;   AMPUTATION  Left 12/08/2020   Procedure: RIGHT ABOVE KNEE AMPUTATION REVISION;  Surgeon: Chuck Hint, MD;  Location: [facility] OR;  Service: Vascular;  Laterality: Left;   AORTA - BILATERAL FEMORAL ARTERY BYPASS GRAFT N/A 05/08/2020   Procedure: AORTA BIFEMORAL BYPASS GRAFT;  Surgeon: Nada Libman, MD;  Location: [facility] OR;  Service: Vascular;  Laterality: N/A;   COLONOSCOPY  06/2016   never   ENDARTERECTOMY FEMORAL Left 11/14/2019   Procedure: Left groin exploration, Redo left femoral artery exposure;  Surgeon: Nada Libman, MD;  Location: [facility] OR;  Service: Vascular;  Laterality: Left;   FEMORAL-FEMORAL BYPASS GRAFT N/A 11/13/2019   Procedure: BYPASS GRAFT FEMORAL-FEMORAL ARTERY RIGHT TO LEFT USING HEMASHIELD GOLD GRAFT 8mm x 30cm;  Surgeon: Nada Libman, MD;  Location: [facility] OR;  Service: Vascular;  Laterality: N/A;   FEMORAL-POPLITEAL BYPASS GRAFT Left 11/13/2019   Procedure: LEFT FEMORAL BELOW KNEE-POPLITEAL ARTERY USING NON-REVERSED GREATER SAPHENOUS VEIN;  Surgeon: Nada Libman, MD;  Location: [facility];  Service: Vascular;  Laterality: Left;   FEMORAL-POPLITEAL BYPASS GRAFT Left 05/08/2020   Procedure: LEFT REDO BYPASS GRAFT COMMON FEMORAL- BELOW KNEE POPLITEAL ARTERY USING PROPATEN GRAFT;  Surgeon: Nada Libman, MD;  Location: [facility] OR;  Service: Vascular;  Laterality: Left;   I & D EXTREMITY Left 09/07/2020   Procedure: IRRIGATION AND DEBRIDEMENT  LEFT LEG WITH EXCISION OF LEFT LEG DISTAL BYPASS GRAFT;  Surgeon: Cephus Shellinglark, Christopher J, MD;  [facility];  Service: Vascular;  Laterality: Left;   LOWER EXTREMITY ANGIOGRAM Left 11/14/2019   Procedure: LEFT LOWER EXTREMITY ANGIOGRAM, BYPASS GRAFT ANGIOPLASTY;  Surgeon: Nada LibmanBrabham, Vance W, MD;  Location: [facility];  Service: Vascular;  Laterality: Left;   PERIPHERAL VASCULAR INTERVENTION  10/15/2019   Procedure: PERIPHERAL VASCULAR INTERVENTION;  Surgeon: Nada LibmanBrabham, Vance W, MD;  [facility];  Service: Cardiovascular;;  Rt Iliac    REMOVAL OF GRAFT Left 10/04/2020   Procedure: REMOVAL OF LEFT FEMORAL TO POPITEAL BYPASS GRAFT;  Surgeon: Nada LibmanBrabham, Vance W, MD;  Location: [facility];  Service: Vascular;  Laterality: Left;   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   Unilateral AKA, left (HCC) 12/15/2020   Protein-calorie malnutrition, severe 12/09/2020   Wound cellulitis 12/07/2020   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/2020   Ischemia of lower extremity 10/01/2020   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   Femoral-popliteal bypass graft occlusion, 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   PAD (peripheral artery disease) (HCC) 11/13/2019   Callus of foot 07/01/2019   Intermittent claudication (HCC) 01/20/2017   Need for influenza vaccination 01/20/2017   Needs smoking cessation education 09/06/2016   Abnormal echocardiogram 09/05/2016   Abnormal EKG 07/25/2016   Preoperative cardiovascular examination 07/25/2016   Shortness of breath on exertion 07/25/2016   Dyslipidemia, goal LDL below 70 07/25/2016   High blood pressure 07/25/2016   Abnormal chest x-ray 07/04/2016   Peripheral vascular disease (HCC) 07/04/2016   Decreased pedal pulses 06/24/2016   Varicose vein of leg 06/24/2016   Heavy smoker 06/24/2016   Abnormal weight loss 06/24/2016   Foot pain, left 06/24/2016   Screening for prostate cancer 06/24/2016    ONSET DATE: 12/08/2020   REFERRING DIAG: Z61.096ES78.112A (ICD-10-CM) - Unilateral AKA, left (HCC)    THERAPY DIAG:  Other abnormalities of gait and mobility - Plan: PT plan of care cert/re-cert   Unsteadiness on feet - Plan: PT plan of care cert/re-cert   Hx of AKA (above knee amputation), left (HCC) - Plan: PT plan of care cert/re-cert   PERTINENT HISTORY: PMH significant for HTN, HLD, heavy smoking, peripheral artery disease s/p bilateral femoral artery bypass graft   Vitals:   07/16/21 1235   BP: (!) 127/92  Pulse: 91     SUBJECTIVE STATEMENT: Has been having issues with elevated BP and has been to the urgent care/ED 4 times since last session. Med's were adjusted last week, no changes made at urgent care today. No falls. Having some residual limb pain. Saw Kathlene NovemberMike who checked prosthetic height, did not say anything about locking out knee or make any adjustments to the knee/socket.    PAIN:  Are you having pain? Yes NPRS scale: 7/10 Pain location: left residual limb Pain orientation: Left  PAIN TYPE: chronic, phantom Pain description:  throbbing, phantom pains   Aggravating factors: immobility Relieving factors: movement, walking with prosthesis     TODAY'S TREATMENT:   07/16/2021 Pt  donned prosthesis in bathroom prior to session starting.   GAIT: Gait pattern: step to pattern, step through pattern, decreased step length- Right, and decreased stance time- Left Distance walked: 50 x 1, 85 x 1 (BP checked here) Assistive device utilized: Walker - 2 wheeled and prosthesis Level of assistance: min guard assist Comments: cues  for decreased prosthetic step length, for heel strike on prosthesis and to not leg it "come back" after swing phase, weight shifting over prosthesis in stance before advancing right LE and for increased right step length.   STAIRS: Level of Assistance:  min assist  Stair Negotiation Technique: Step to Pattern Forwards with Bilateral Rails  Number of Stairs: 4   Height of Stairs: 6  Comments: cues   BP after gait/stairs: 155/95, HR 87 BP after resting 5 minutes: 143/102, HR 81 BP after another 5 minutes of resting: 143/114, HR 81 Manual recheck: 152/100      PATIENT EDUCATION: Education details:continue to monitor BP, continue with current HEP.  Person educated: Patient Education method: Explanation Education comprehension: verbalized understanding     HOME EXERCISE PROGRAM: Access Code: [email or web address] URL:  [email or web address] Date: 07/07/2021 Prepared by: Harriet Butte  Exercises - Lateral Weight Shift with Parallel Bars (AKA)  - 1 x daily - 7 x weekly - 3 sets - 10 reps - Lateral Weight Shift with Arm Raise and Walker  - 1 x daily - 7 x weekly - 3 sets - 10 reps - Alternating Step Forward with Support  - 1 x daily - 7 x weekly - 3 sets - 10 reps        GOALS: Goals reviewed with patient? Yes   SHORT TERM GOALS: Target date: 07/06/2021   Patient will be able to ambulate 230 feet with RW and SBA to improve functional mobility Baseline: 115' 06/08/21 Goal status: INITIAL   2.  Patient will be able to go up and down ramp and curb with RW and SBA to improve access to community. Baseline: not assessed 06/08/21 Goal status: INITIAL   3.  Patient will demo proper sock management to improve comfort, endurance and safety while using prosthesis with ADLs and functional walking. Baseline: Education initiated 06/08/21 Goal status: INITIAL     LONG TERM GOALS: Target date: 08/03/2021   Patient will be able to ambulate >500 feet in 6 minutes with RW to improve functional endurance with walking  Baseline: not assessed 06/08/21 Goal status: INITIAL   2.  Patient will demo gait speed of 0.30 m/s with RW to improve community ambulation. Baseline: 0.26m/s with RW 06/08/21 Goal status: INITIAL   3.  Patient will demo TUG score of <60 seconds with use of RW to improve functional mobility Baseline: 99 sec with RW Goal status: INITIAL   4.  Patient will be able to ambulate 230' with quad cane or LRAD to improve community mobility Baseline: 115' RW Goal status: INITIAL  5. Patient will demo >35/56 on BBS to improve functional standing balance. Baseline: 28/56 (07/09/21) Goal status: added on 07/09/21     ASSESSMENT:   CLINICAL IMPRESSION: Today's skilled session continued to focus on gait/stairs with prosthesis/RW. Increased pain reported after 2 bouts of gait and stairs. Pt did  not have any socks to don to decreased pressure/pain, left them at home. Did have increased BP with activity and onset of limb pain. Pt escorted to car via wheelchair by rehab tech due to limb pain and elevated BP. Pt to continue to monitor it at home and take med's per MD instructions from this morning at urgent care. Advised pt to rest and not be too active so BP can recover. Pt verbalized understanding.       OBJECTIVE IMPAIRMENTS Abnormal gait, cardiopulmonary status limiting activity, decreased activity tolerance, decreased balance, decreased endurance, decreased mobility, difficulty walking, decreased ROM, decreased strength,  decreased safety awareness, hypomobility, increased fascial restrictions, impaired flexibility, improper body mechanics, postural dysfunction, prosthetic dependency , and pain.    ACTIVITY LIMITATIONS cleaning, community activity, laundry, and yard work.    PERSONAL FACTORS Past/current experiences, Time since onset of injury/illness/exacerbation, and 1-2 comorbidities: PAD, HTN, HLD  are also affecting patient's functional outcome.      REHAB POTENTIAL: Good   CLINICAL DECISION MAKING: Stable/uncomplicated   EVALUATION COMPLEXITY: Low   PLAN: PT FREQUENCY: 2x/week   PT DURATION: 8 weeks   PLANNED INTERVENTIONS: Therapeutic exercises, Therapeutic activity, Neuromuscular re-education, Balance training, Gait training, Patient/Family education, Joint manipulation, Joint mobilization, Orthotic/Fit training, Prosthetic training, Cryotherapy, Moist heat, scar mobilization, and Manual therapy   PLAN FOR NEXT SESSION:  Work on R closed chain stability on R hip during stance phase to improve step length on L,  work on standing balance, stairs   Sallyanne Kuster, [place], [facility] [address] [place], [place] [address] [phone] 07/16/21, 3:30 PM

## 2021-07-16 NOTE — ED Provider Notes (Signed)
[facility]    CSN: [id number] Arrival date & time: 07/16/21  0818      History   Chief Complaint Chief Complaint  Patient presents with   Hypertension    HPI Corene CorneaDavid L Dastrup is a 61 y.o. male.   Patient presents for evaluation of blood pressure, endorses that last night his blood pressure was elevated to 158/111 with a accompanying generalized headache and difficulty sleeping.  Endorses that he feels much better at this time and thinks his blood pressure has reduced.  Currently taking carvedilol, losartan, clonidine and amlodipine for blood pressure, endorses taking medication daily as prescribed without missing dosages.  Was recently evaluated in the emergency department on 07/13/2021 and 07/10/2021 where all work-up was negative and amlodipine was started.  History of the left AKA, hypertension, dyslipidemia, PVD, PAD.  Daily smoker.    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   Femoral-popliteal bypass graft occlusion, 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   PAD (peripheral artery disease) (HCC) 11/13/2019   Callus of foot 07/01/2019   Intermittent claudication (HCC) 01/20/2017   Need for influenza vaccination 01/20/2017   Needs smoking cessation education 09/06/2016   Abnormal echocardiogram 09/05/2016   Abnormal EKG 07/25/2016   Preoperative cardiovascular examination 07/25/2016   Shortness of breath on  exertion 07/25/2016   Dyslipidemia, goal LDL below 70 07/25/2016   High blood pressure 07/25/2016   Abnormal chest x-ray 07/04/2016   Peripheral vascular disease (HCC) 07/04/2016   Decreased pedal pulses 06/24/2016   Varicose vein of leg 06/24/2016   Heavy smoker 06/24/2016   Abnormal weight loss 06/24/2016   Foot pain, left 06/24/2016   Screening for prostate cancer 06/24/2016    Past Surgical History:  Procedure Laterality Date   ABDOMINAL AORTOGRAM W/LOWER EXTREMITY N/A 07/12/2016   Procedure: Abdominal Aortogram w/Lower Extremity;  Surgeon: Nada LibmanBrabham, Vance W, MD;  [facility];  Service: Cardiovascular;  Laterality: N/A;  Bilateral extermity: Patent Renal As. No sig Dz in infrarenal Abd Aorta. Normal Bilat Iliac arteries. R SFA is 100% @ origin - recon in AK-Pop A. R PT A patent. L CFA occluded. L PFA recon @ origin, L SFA occluded w/ recon in AK Pop A.   ABDOMINAL AORTOGRAM W/LOWER EXTREMITY N/A 10/15/2019   Procedure: ABDOMINAL AORTOGRAM W/LOWER EXTREMITY;  Surgeon: Nada LibmanBrabham, Vance W, MD;  [facility];  Service: Cardiovascular;  Laterality: N/A;   AMPUTATION Left 10/04/2020   Procedure: LEFT ABOVE KNEE AMPUTATION;  Surgeon: Nada LibmanBrabham, Vance W, MD;  [facility];  Service: Vascular;  Laterality: Left;   AMPUTATION Left 12/08/2020   Procedure: RIGHT ABOVE KNEE AMPUTATION REVISION;  Surgeon: Chuck Hintickson, Christopher S, MD;  [facility];  Service: Vascular;  Laterality: Left;   AORTA - BILATERAL FEMORAL ARTERY BYPASS GRAFT N/A 05/08/2020   Procedure: AORTA BIFEMORAL BYPASS GRAFT;  Surgeon: Nada LibmanBrabham, Vance W, MD;  [facility];  Service: Vascular;  Laterality: N/A;   COLONOSCOPY  06/2016   never   ENDARTERECTOMY FEMORAL Left 11/14/2019   Procedure: Left groin exploration, Redo left femoral artery exposure;  Surgeon: Nada Libman, MD;  Location: [facility];  Service: Vascular;  Laterality: Left;   FEMORAL-FEMORAL BYPASS GRAFT N/A 11/13/2019   Procedure:  BYPASS GRAFT FEMORAL-FEMORAL ARTERY RIGHT TO LEFT USING HEMASHIELD GOLD GRAFT 93mm x 30cm;  Surgeon: Nada Libman, MD;  Location[facility];  Service: Vascular;  Laterality: N/A;   FEMORAL-POPLITEAL BYPASS GRAFT Left 11/13/2019   Procedure: LEFT FEMORAL BELOW KNEE-POPLITEAL ARTERY USING NON-REVERSED GREATER SAPHENOUS VEIN;  Surgeon: Nada Libman, MD;  [facility];  Service: Vascular;  Laterality: Left;   FEMORAL-POPLITEAL BYPASS GRAFT Left 05/08/2020   Procedure: LEFT REDO BYPASS GRAFT COMMON FEMORAL- BELOW KNEE POPLITEAL ARTERY USING PROPATEN GRAFT;  Surgeon: Nada Libman, MD;  Location: [facility];  Service: Vascular;  Laterality: Left;   I & D EXTREMITY Left 09/07/2020   Procedure: IRRIGATION AND DEBRIDEMENT LEFT LEG WITH EXCISION OF LEFT LEG DISTAL BYPASS GRAFT;  Surgeon: Cephus Shelling, MD;  Location: [facility];  Service: Vascular;  Laterality: Left;   LOWER EXTREMITY ANGIOGRAM Left 11/14/2019   Procedure: LEFT LOWER EXTREMITY ANGIOGRAM, BYPASS GRAFT ANGIOPLASTY;  Surgeon: Nada Libman, MD;  Location[facility];  Service: Vascular;  Laterality: Left;   PERIPHERAL VASCULAR INTERVENTION  10/15/2019   Procedure: PERIPHERAL VASCULAR INTERVENTION;  Surgeon: Nada Libman, MD;  [facility];  Service: Cardiovascular;;  Rt Iliac   REMOVAL OF GRAFT Left 10/04/2020   Procedure: REMOVAL OF LEFT FEMORAL TO POPITEAL BYPASS GRAFT;  Surgeon: Nada Libman, MD;  Location: [facility];  Service: Vascular;  Laterality: Left;       Home Medications    Prior to Admission medications   Medication Sig Start Date End Date Taking? Authorizing Provider  acetaminophen (TYLENOL) 325 MG tablet Take 1-2 tablets (325-650 mg total) by mouth every 4 (four) hours as needed for mild pain. Patient not taking: Reported on 06/22/2021 12/25/20   Love, Evlyn Kanner, PA-C  albuterol (VENTOLIN HFA) 108 (90 Base) MCG/ACT inhaler Inhale 1-2 puffs into the lungs every 6 (six) hours as needed for wheezing or  shortness of breath. 06/12/21   Tanda Rockers A, DO  amLODipine (NORVASC) 2.5 MG tablet Take 1 tablet (2.5 mg total) by mouth daily. 07/10/21   Raspet, Noberto Retort, PA-C  atorvastatin (LIPITOR) 20 MG tablet Take 1 tablet (20 mg total) by mouth at bedtime. 06/28/21   Arnette Felts, FNP  carvedilol (COREG) 6.25 MG tablet Take 1 tablet (6.25 mg total) by mouth 2 (two) times daily with a meal. 07/05/21 09/03/21  Raulkar, Drema Pry, MD  cloNIDine (CATAPRES - DOSED IN MG/24 HR) 0.1 mg/24hr patch Place 1 patch (0.1 mg total) onto the skin once a week. 07/05/21   Raulkar, Drema Pry, MD  clopidogrel (PLAVIX) 75 MG tablet Take 1 tablet (75 mg total) by mouth daily. 05/11/21     docusate sodium (COLACE) 100 MG capsule Take 1 capsule (100 mg total) by mouth daily. Patient not taking: Reported on 06/22/2021 03/16/21   Horton Chin, MD  gabapentin (NEURONTIN) 600 MG tablet Take 1 tablet (600 mg total) by mouth 2 (two) times daily. 03/22/21   Raulkar, Drema Pry, MD  guaiFENesin-dextromethorphan (ROBITUSSIN DM) 100-10 MG/5ML syrup Take 15 mLs by mouth every 4 (four) hours as needed for cough. Patient not taking: Reported on 06/22/2021  12/15/20   Lorin Glass, MD  isosorbide mononitrate (IMDUR) 30 MG 24 hr tablet Take 1 tablet (30 mg total) by mouth once daily. 07/14/21     losartan (COZAAR) 50 MG tablet Take 1 tablet (50 mg total) by mouth in the morning and at bedtime. 07/05/21   Raulkar, Drema Pry, MD  methocarbamol (ROBAXIN) 500 MG tablet Take 1 tablet (500 mg total) by mouth every 6 (six) hours as needed for muscle spasms. Patient not taking: Reported on 06/22/2021 12/25/20   Jacquelynn Cree, PA-C  Multiple Vitamin (MULTIVITAMIN WITH MINERALS) TABS tablet Take 1 tablet by mouth daily. Patient not taking: Reported on 06/22/2021 12/16/20   Lorin Glass, MD  NON FORMULARY 1-5 drops See admin instructions. Unnamed tinnitus relief ear drops- Instill 1-5 drops into the affected ear(s) two times a day as needed for tinnitus    [provider]  nortriptyline (PAMELOR) 50 MG capsule Take 2 capsules (100 mg total) by mouth at bedtime. 06/30/21   Al Decant, PA-C  senna (SENOKOT) 8.6 MG TABS tablet Take 1 tablet (8.6 mg total) by mouth 2 (two) times daily. Patient not taking: Reported on 06/22/2021 12/25/20   Love, Evlyn Kanner, PA-C  Vitamins-Lipotropics (LIPO-FLAVONOID PLUS PO) Take 1 capsule by mouth 2 (two) times daily as needed (for tinnitus).    [provider]    Family History Family History  Problem Relation Age of Onset   Arthritis Mother    COPD Father    Stroke Father    Multiple sclerosis Sister     Social History Social History   Tobacco Use   Smoking status: Former    Packs/day: 1.50    Years: 29.00    Pack years: 43.50    Types: Cigarettes    Quit date: 10/22/2020    Years since quitting: 0.7   Smokeless tobacco: Never   Tobacco comments:    Quit smoking September 2022.  Using Nicoderm patch currently.  Vaping Use   Vaping Use: Never used  Substance Use Topics   Alcohol use: No   Drug use: No     Allergies   Gabapentin, Tetanus toxoids, Aspirin, and Garlic   Review of Systems Review of Systems  Constitutional: Negative.   Respiratory: Negative.    Cardiovascular: Negative.   Neurological:  Positive for headaches. Negative for dizziness, tremors, seizures, syncope, facial asymmetry, speech difficulty, weakness, light-headedness and numbness.    Physical Exam Triage Vital Signs ED Triage Vitals  Enc Vitals Group     BP 07/16/21 0909 137/86     Pulse Rate 07/16/21 0906 69     Resp 07/16/21 0906 19     Temp 07/16/21 0906 97.6 F (36.4 C)     Temp src --      SpO2 07/16/21 0906 95 %     Weight --      Height --      Head Circumference --      Peak Flow --      Pain Score 07/16/21 0905 8     Pain Loc --      Pain Edu? --      Excl. in GC? --    No data found.  Updated Vital Signs BP 137/86   Pulse 69   Temp 97.6 F (36.4 C)   Resp 19   SpO2 95%    Visual Acuity Right Eye Distance:   Left Eye Distance:   Bilateral Distance:    Right Eye Near:  Left Eye Near:    Bilateral Near:     Physical Exam Constitutional:      Appearance: Normal appearance.  Eyes:     Extraocular Movements: Extraocular movements intact.  Cardiovascular:     Rate and Rhythm: Normal rate and regular rhythm.     Pulses: Normal pulses.     Heart sounds: Normal heart sounds.  Pulmonary:     Effort: Pulmonary effort is normal.     Breath sounds: Normal breath sounds.  Neurological:     Mental Status: He is alert and oriented to person, place, and time. Mental status is at baseline.  Psychiatric:        Mood and Affect: Mood normal.        Behavior: Behavior normal.     UC Treatments / Results  Labs (all labs ordered are listed, but only abnormal results are displayed) Labs Reviewed - No data to display  EKG   Radiology No results found.  Procedures Procedures (including critical care time)  Medications Ordered in UC Medications - No data to display  Initial Impression / Assessment and Plan / UC Course  I have reviewed the triage vital signs and the nursing notes.  Pertinent labs & imaging results that were available during my care of the patient were reviewed by me and considered in my medical decision making (see chart for details).  Elevated blood pressure reading with diagnosis of hypertension  Vital signs are stable with blood pressure at 137/86 in triage, EKG showing normal sinus rhythm with ST abnormality, no changes when compared to EKG on 07/13/2021, S1 and S2 heard to auscultation, patient is in no signs of distress, discussed all findings above with patient, as medication was recently changed on 07/10/2021, will not make any changes to medication today, recommended patient continue to take daily medication as prescribed, advise measuring and recording blood pressure daily and recording with follow-up visit with PCP in 2 weeks,  discussed signs of hypertensive urgency and given a parameter for elevated blood pressure and when to seek care, given walking referral to [place] [facility] for additional cardiac work-up Final Clinical Impressions(s) / UC Diagnoses   Final diagnoses:  None   Discharge Instructions   None    ED Prescriptions   None    PDMP not reviewed this encounter.   Valinda Hoar, [place] 07/16/21 [phone]

## 2021-07-17 ENCOUNTER — Emergency Department (HOSPITAL_COMMUNITY): Payer: Medicaid Other

## 2021-07-17 ENCOUNTER — Emergency Department (HOSPITAL_COMMUNITY)
Admission: EM | Admit: 2021-07-17 | Discharge: 2021-07-17 | Disposition: A | Discharge status: Home / Self Care | Payer: Medicaid Other | Attending: Emergency Medicine | Admitting: Emergency Medicine

## 2021-07-17 ENCOUNTER — Other Ambulatory Visit: Payer: Self-pay

## 2021-07-17 ENCOUNTER — Encounter (HOSPITAL_COMMUNITY): Payer: Self-pay | Admitting: Emergency Medicine

## 2021-07-17 DIAGNOSIS — I10 Essential (primary) hypertension: Secondary | ICD-10-CM | POA: Diagnosis not present

## 2021-07-17 DIAGNOSIS — D649 Anemia, unspecified: Secondary | ICD-10-CM | POA: Diagnosis not present

## 2021-07-17 DIAGNOSIS — Z79899 Other long term (current) drug therapy: Secondary | ICD-10-CM | POA: Insufficient documentation

## 2021-07-17 DIAGNOSIS — Z7902 Long term (current) use of antithrombotics/antiplatelets: Secondary | ICD-10-CM | POA: Insufficient documentation

## 2021-07-17 DIAGNOSIS — R0789 Other chest pain: Secondary | ICD-10-CM

## 2021-07-17 DIAGNOSIS — R079 Chest pain, unspecified: Secondary | ICD-10-CM | POA: Diagnosis present

## 2021-07-17 DIAGNOSIS — D509 Iron deficiency anemia, unspecified: Secondary | ICD-10-CM

## 2021-07-17 DIAGNOSIS — R519 Headache, unspecified: Secondary | ICD-10-CM

## 2021-07-17 LAB — TROPONIN I (HIGH SENSITIVITY)
Troponin I (High Sensitivity): 7 ng/L (ref <18)
Troponin I (High Sensitivity): 6 ng/L (ref <18)

## 2021-07-17 LAB — D-DIMER, QUANTITATIVE: D-Dimer, Quant: 1.73 ug/mL-FEU — ABNORMAL HIGH (ref 0.00–0.50)

## 2021-07-17 LAB — BASIC METABOLIC PANEL
Anion gap: 6 (ref 5–15)
BUN: 9 mg/dL (ref 8–23)
CO2: 23 mmol/L (ref 22–32)
Calcium: 9 mg/dL (ref 8.9–10.3)
Chloride: 109 mmol/L (ref 98–111)
Creatinine, Ser: 0.91 mg/dL (ref 0.61–1.24)
GFR, Estimated: >60 mL/min (ref >60)
Glucose, Bld: 98 mg/dL (ref 70–99)
Potassium: 3.7 mmol/L (ref 3.5–5.1)
Sodium: 138 mmol/L (ref 135–145)

## 2021-07-17 LAB — CBC
HCT: 36.2 % — ABNORMAL LOW (ref 39.0–52.0)
Hemoglobin: 11.8 g/dL — ABNORMAL LOW (ref 13.0–17.0)
MCH: 22.5 pg — ABNORMAL LOW (ref 26.0–34.0)
MCHC: 32.6 g/dL (ref 30.0–36.0)
MCV: 69.1 fL — ABNORMAL LOW (ref 80.0–100.0)
Platelets: 347 10*3/uL (ref 150–400)
RBC: 5.24 MIL/uL (ref 4.22–5.81)
RDW: 19 % — ABNORMAL HIGH (ref 11.5–15.5)
WBC: 6.1 10*3/uL (ref 4.0–10.5)
nRBC: 0 % (ref 0.0–0.2)

## 2021-07-17 MED ORDER — LACTATED RINGERS IV BOLUS
1000.0000 mL | Freq: Once | INTRAVENOUS | Status: AC
  Administered 2021-07-17: 1000 mL via INTRAVENOUS

## 2021-07-17 MED ORDER — IOHEXOL 350 MG/ML SOLN
75.0000 mL | Freq: Once | INTRAVENOUS | Status: AC | PRN
  Administered 2021-07-17: 75 mL via INTRAVENOUS

## 2021-07-17 MED ORDER — PROCHLORPERAZINE EDISYLATE 10 MG/2ML IJ SOLN
10.0000 mg | Freq: Once | INTRAMUSCULAR | Status: AC
  Administered 2021-07-17: 10 mg via INTRAVENOUS
  Filled 2021-07-17: qty 2

## 2021-07-17 MED ORDER — DIPHENHYDRAMINE HCL 50 MG/ML IJ SOLN
25.0000 mg | Freq: Once | INTRAMUSCULAR | Status: AC
  Administered 2021-07-17: 25 mg via INTRAVENOUS
  Filled 2021-07-17: qty 1

## 2021-07-17 NOTE — ED Notes (Signed)
Patient transported to CT 

## 2021-07-17 NOTE — ED Triage Notes (Signed)
Pt reported to ED with c/o elevated BP stating "its been going up and down all night and I am having ringing in my ears and I cannot sleep". Pt also states he is having chest tightness and shortness of breath.

## 2021-07-17 NOTE — Discharge Instructions (Signed)
Please monitor your blood pressure at home.  Blood pressure should be taken once a day and you should keep a record of the readings.  Take that record with you when you see your primary care provider.  Return if you are having any problems.

## 2021-07-17 NOTE — ED Notes (Signed)
Pt currently resting comfortably. Respirations even and unlabored. 

## 2021-07-17 NOTE — ED Provider Notes (Signed)
[facility] EMERGENCY DEPARTMENT Provider Note   CSN: [id number] Arrival date & time: 07/17/21  0034     History  Chief Complaint  Patient presents with   Hypertension   Chest Pain    Benjamin [name] is a 61 y.o. male.  The history is provided by the patient.  Hypertension Associated symptoms include chest pain.  Chest Pain He has history of hypertension, hyperlipidemia, peripheral vascular disease and comes in because his blood pressure at home was high.  It got as high as 158/110.  He checked his blood pressure because he developed a left-sided headache and some ringing in his ears.  He also has had a tight feeling in his chest.  There has been no dyspnea, nausea, diaphoresis.  He denies any vision changes.  There has been no photophobia or phonophobia.   Home Medications Prior to Admission medications   Medication Sig Start Date End Date Taking? Authorizing Provider  acetaminophen (TYLENOL) 325 MG tablet Take 1-2 tablets (325-650 mg total) by mouth every 4 (four) hours as needed for mild pain. Patient not taking: Reported on 06/22/2021 12/25/20   Love, Evlyn Kanner, PA-C  albuterol (VENTOLIN HFA) 108 (90 Base) MCG/ACT inhaler Inhale 1-2 puffs into the lungs every 6 (six) hours as needed for wheezing or shortness of breath. 06/12/21   Tanda Rockers A, DO  amLODipine (NORVASC) 2.5 MG tablet Take 1 tablet (2.5 mg total) by mouth daily. 07/10/21   Raspet, Noberto Retort, PA-C  atorvastatin (LIPITOR) 20 MG tablet Take 1 tablet (20 mg total) by mouth at bedtime. 06/28/21   Arnette Felts, FNP  carvedilol (COREG) 6.25 MG tablet Take 1 tablet (6.25 mg total) by mouth 2 (two) times daily with a meal. 07/05/21 09/03/21  Raulkar, Drema Pry, MD  cloNIDine (CATAPRES - DOSED IN MG/24 HR) 0.1 mg/24hr patch Place 1 patch (0.1 mg total) onto the skin once a week. 07/05/21   Raulkar, Drema Pry, MD  clopidogrel (PLAVIX) 75 MG tablet Take 1 tablet (75 mg total) by mouth daily. 05/11/21     docusate sodium  (COLACE) 100 MG capsule Take 1 capsule (100 mg total) by mouth daily. Patient not taking: Reported on 06/22/2021 03/16/21   Horton Chin, MD  gabapentin (NEURONTIN) 600 MG tablet Take 1 tablet (600 mg total) by mouth 2 (two) times daily. 03/22/21   Raulkar, Drema Pry, MD  guaiFENesin-dextromethorphan (ROBITUSSIN DM) 100-10 MG/5ML syrup Take 15 mLs by mouth every 4 (four) hours as needed for cough. Patient not taking: Reported on 06/22/2021 12/15/20   Lorin Glass, MD  isosorbide mononitrate (IMDUR) 30 MG 24 hr tablet Take 1 tablet (30 mg total) by mouth once daily. 07/14/21     losartan (COZAAR) 50 MG tablet Take 1 tablet (50 mg total) by mouth in the morning and at bedtime. 07/05/21   Raulkar, Drema Pry, MD  methocarbamol (ROBAXIN) 500 MG tablet Take 1 tablet (500 mg total) by mouth every 6 (six) hours as needed for muscle spasms. Patient not taking: Reported on 06/22/2021 12/25/20   Jacquelynn Cree, PA-C  Multiple Vitamin (MULTIVITAMIN WITH MINERALS) TABS tablet Take 1 tablet by mouth daily. Patient not taking: Reported on 06/22/2021 12/16/20   Lorin Glass, MD  NON FORMULARY 1-5 drops See admin instructions. Unnamed tinnitus relief ear drops- Instill 1-5 drops into the affected ear(s) two times a day as needed for tinnitus    [provider]  nortriptyline (PAMELOR) 50 MG capsule Take 2 capsules (100 mg total)  by mouth at bedtime. 06/30/21   Al Decant, PA-C  senna (SENOKOT) 8.6 MG TABS tablet Take 1 tablet (8.6 mg total) by mouth 2 (two) times daily. Patient not taking: Reported on 06/22/2021 12/25/20   Love, Evlyn Kanner, PA-C  Vitamins-Lipotropics (LIPO-FLAVONOID PLUS PO) Take 1 capsule by mouth 2 (two) times daily as needed (for tinnitus).    [provider]      Allergies    Gabapentin, Tetanus toxoids, Aspirin, and Garlic    Review of Systems   Review of Systems  Cardiovascular:  Positive for chest pain.  All other systems reviewed and are negative.  Physical  Exam Updated Vital Signs BP (!) 144/90   Pulse 62   Temp 98.4 F (36.9 C)   Resp 19   SpO2 97%  Physical Exam Vitals and nursing note reviewed.  61 year old male, resting comfortably and in no acute distress. Vital signs are significant for mildly elevated blood pressure. Oxygen saturation is 97%, which is normal. Head is normocephalic and atraumatic. PERRLA, EOMI. Oropharynx is clear. Neck is nontender and supple without adenopathy or JVD. Back is nontender and there is no CVA tenderness. Lungs are clear without rales, wheezes, or rhonchi. Chest is nontender. Heart has regular rate and rhythm without murmur. Abdomen is soft, flat, nontender. Extremities have trace edema, status post left above-the-knee amputation. Skin is warm and dry without rash. Neurologic: Mental status is normal, cranial nerves are intact, moves all extremities equally.  ED Results / Procedures / Treatments   Labs (all labs ordered are listed, but only abnormal results are displayed) Labs Reviewed  CBC - Abnormal; Notable for the following components:      Result Value   Hemoglobin 11.8 (*)    HCT 36.2 (*)    MCV 69.1 (*)    MCH 22.5 (*)    RDW 19.0 (*)    All other components within normal limits  D-DIMER, QUANTITATIVE - Abnormal; Notable for the following components:   D-Dimer, Quant 1.73 (*)    All other components within normal limits  BASIC METABOLIC PANEL  TROPONIN I (HIGH SENSITIVITY)  TROPONIN I (HIGH SENSITIVITY)    EKG EKG Interpretation  Date/Time:  Saturday Jul 17 2021 00:55:51 EDT Ventricular Rate:  70 PR Interval:  134 QRS Duration: 80 QT Interval:  408 QTC Calculation: 440 R Axis:   17 Text Interpretation: Normal sinus rhythm with sinus arrhythmia ST & T wave abnormality, consider inferolateral ischemia Abnormal ECG When compared with ECG of 16-Jul-2021 09:17, No significant change was found Confirmed by Dione Booze ([id number]) on 07/17/2021 1:48:54 AM  Radiology DG Chest 2  View  Result Date: 07/17/2021 CLINICAL DATA:  Chest tightness, shortness of breath EXAM: CHEST - 2 VIEW COMPARISON:  07/13/2021 FINDINGS: Linear scarring in the lung bases. No confluent airspace opacities or effusions. Heart is normal size. No bony abnormality. IMPRESSION: Bibasilar scarring. Electronically Signed   By: Charlett Nose M.D.   On: 07/17/2021 01:42   CT Angio Chest PE W and/or Wo Contrast  Result Date: 07/17/2021 CLINICAL DATA:  Hypertension with chest pain.  Positive D-dimer. EXAM: CT ANGIOGRAPHY CHEST WITH CONTRAST TECHNIQUE: Multidetector CT imaging of the chest was performed using the standard protocol during bolus administration of intravenous contrast. Multiplanar CT image reconstructions and MIPs were obtained to evaluate the vascular anatomy. RADIATION DOSE REDUCTION: This exam was performed according to the departmental dose-optimization program which includes automated exposure control, adjustment of the mA and/or kV according to patient  size and/or use of iterative reconstruction technique. CONTRAST:  75mL OMNIPAQUE IOHEXOL 350 MG/ML SOLN COMPARISON:  None Available. FINDINGS: Cardiovascular: The heart size is normal. No substantial pericardial effusion. Coronary artery calcification is evident. Mild atherosclerotic calcification is noted in the wall of the thoracic aorta. There is no filling defect within the opacified pulmonary arteries to suggest the presence of an acute pulmonary embolus. Mediastinum/Nodes: No mediastinal lymphadenopathy. There is no hilar lymphadenopathy. The esophagus has normal imaging features. There is no axillary lymphadenopathy. Lungs/Pleura: Centrilobular emphsyema noted. No focal airspace consolidation. No pleural effusion. No suspicious pulmonary nodule or mass. Upper Abdomen: Tiny nonobstructing stone identified upper pole right kidney. Musculoskeletal: No worrisome lytic or sclerotic osseous abnormality. Review of the MIP images confirms the above  findings. IMPRESSION: No CT evidence for acute pulmonary embolus. Aortic Atherosclerosis (ICD10-I70.0) and Emphysema (ICD10-J43.9). Electronically Signed   By: Kennith Center M.D.   On: 07/17/2021 06:43    Procedures Procedures  Cardiac monitor shows normal sinus rhythm, per my interpretation.  Medications Ordered in ED Medications  lactated ringers bolus 1,000 mL (has no administration in time range)  prochlorperazine (COMPAZINE) injection 10 mg (has no administration in time range)  diphenhydrAMINE (BENADRYL) injection 25 mg (has no administration in time range)    ED Course/ Medical Decision Making/ A&P                           Medical Decision Making Amount and/or Complexity of Data Reviewed Labs: ordered. Radiology: ordered.  Risk Prescription drug management.   Elevated blood pressure, most likely secondary rather than primary.  Blood pressure is not high enough to account for headache and tinnitus.  Headache appears benign.  No red flags to suggest serious pathology such as subarachnoid hemorrhage, meningitis.  Chest pain also seems somewhat atypical, doubt ACS.  Although symptoms are atypical, we will screen for pulmonary embolism with D-dimer.  ST and T changes which are not changed from prior ECG.  I have reviewed and interpreted all of the laboratory tests, and he is noted to have stable microcytic anemia, normal initial troponin with second troponin pending.  Given the length of time that symptoms have been present, I doubt pain is cardiac.  He will be given a headache cocktail of lactated Ringer's solution, prochlorperazine, diphenhydramine and reassessed.  Old records are reviewed, and he does have prior ED visits for elevated blood pressure and atypical chest pain, most recently on 07/13/2021.  He also had a nuclear stress test on 11/06/2019 which was a low risk study.  He feels significantly better following above-noted treatment.  I have reviewed his laboratory tests and  independently interpreted them.  Troponin is normal x2, mild microcytic anemia is present and unchanged from baseline.  Metabolic panel is normal.  D-dimer is significantly elevated, will need to send for CT angiogram to rule out pulmonary embolism.  CT angiogram shows no evidence of pulmonary embolism or other acute process.  I have independently viewed the images, and agree with the radiologist's interpretation.  Blood pressure has been somewhat labile in the emergency department, no indication for immediate intervention but will need to be followed by his primary care provider as an outpatient.  Final Clinical Impression(s) / ED Diagnoses Final diagnoses:  Atypical chest pain  Headache, unspecified headache type  Microcytic anemia  Elevated blood pressure reading with diagnosis of hypertension    Rx / DC Orders ED Discharge Orders     None  Dione BoozeGlick, [place], MD 07/17/21 [id number]

## 2021-07-19 ENCOUNTER — Encounter (HOSPITAL_COMMUNITY): Payer: Self-pay | Admitting: Emergency Medicine

## 2021-07-19 ENCOUNTER — Emergency Department (HOSPITAL_COMMUNITY)
Admission: EM | Admit: 2021-07-19 | Discharge: 2021-07-19 | Discharge status: Against Medical Advice | Payer: Medicaid Other | Attending: Emergency Medicine | Admitting: Emergency Medicine

## 2021-07-19 ENCOUNTER — Emergency Department (HOSPITAL_COMMUNITY): Payer: Medicaid Other

## 2021-07-19 ENCOUNTER — Ambulatory Visit (HOSPITAL_COMMUNITY)
Admission: EM | Admit: 2021-07-19 | Discharge: 2021-07-19 | Disposition: A | Discharge status: Home / Self Care | Payer: Medicaid Other

## 2021-07-19 ENCOUNTER — Other Ambulatory Visit: Payer: Self-pay

## 2021-07-19 DIAGNOSIS — H9319 Tinnitus, unspecified ear: Secondary | ICD-10-CM | POA: Insufficient documentation

## 2021-07-19 DIAGNOSIS — I10 Essential (primary) hypertension: Secondary | ICD-10-CM

## 2021-07-19 DIAGNOSIS — Z5321 Procedure and treatment not carried out due to patient leaving prior to being seen by health care provider: Secondary | ICD-10-CM | POA: Diagnosis not present

## 2021-07-19 DIAGNOSIS — R0789 Other chest pain: Secondary | ICD-10-CM | POA: Insufficient documentation

## 2021-07-19 DIAGNOSIS — Z0130 Encounter for examination of blood pressure without abnormal findings: Secondary | ICD-10-CM | POA: Insufficient documentation

## 2021-07-19 LAB — CBC
HCT: 38.3 % — ABNORMAL LOW (ref 39.0–52.0)
Hemoglobin: 12.2 g/dL — ABNORMAL LOW (ref 13.0–17.0)
MCH: 22.3 pg — ABNORMAL LOW (ref 26.0–34.0)
MCHC: 31.9 g/dL (ref 30.0–36.0)
MCV: 70.1 fL — ABNORMAL LOW (ref 80.0–100.0)
Platelets: 377 10*3/uL (ref 150–400)
RBC: 5.46 MIL/uL (ref 4.22–5.81)
RDW: 19.4 % — ABNORMAL HIGH (ref 11.5–15.5)
WBC: 6.7 10*3/uL (ref 4.0–10.5)
nRBC: 0 % (ref 0.0–0.2)

## 2021-07-19 LAB — BASIC METABOLIC PANEL
Anion gap: 9 (ref 5–15)
BUN: 8 mg/dL (ref 8–23)
CO2: 21 mmol/L — ABNORMAL LOW (ref 22–32)
Calcium: 8.9 mg/dL (ref 8.9–10.3)
Chloride: 108 mmol/L (ref 98–111)
Creatinine, Ser: 0.97 mg/dL (ref 0.61–1.24)
GFR, Estimated: >60 mL/min (ref >60)
Glucose, Bld: 120 mg/dL — ABNORMAL HIGH (ref 70–99)
Potassium: 3.5 mmol/L (ref 3.5–5.1)
Sodium: 138 mmol/L (ref 135–145)

## 2021-07-19 LAB — TROPONIN I (HIGH SENSITIVITY): Troponin I (High Sensitivity): 7 ng/L (ref <18)

## 2021-07-19 NOTE — ED Triage Notes (Signed)
Patient reports central chest tightness this evening and left ear "ringing" / requesting blood pressure check .

## 2021-07-19 NOTE — ED Provider Triage Note (Signed)
Emergency Medicine Provider Triage Evaluation Note  THERMAN HUGHLETT , a 61 y.o. male  was evaluated in triage.  Pt complains of chest tightness and ringing in his ear. The patient also requests blood pressure check. He has been seen here multiple times in the past few weeks for similar complaints. Denies that this pain is any different.  Review of Systems  Positive:  Negative:   Physical Exam  BP (!) 158/101   Pulse 88   Temp 98.4 F (36.9 C) (Oral)   Resp 16   SpO2 93%  Gen:   Awake, no distress   Resp:  Normal effort  MSK:   Moves extremities without difficulty  Other:    Medical Decision Making  Medically screening exam initiated at 9:34 PM.  Appropriate orders placed.  [name] was informed that the remainder of the evaluation will be completed by another provider, this initial triage assessment does not replace that evaluation, and the importance of remaining in the ED until their evaluation is complete.  Patient was just seen at [facility] this AM for symptoms. Has an appt with ENT in June. Will order chest pain workup.    Achille Rich, [place] 07/19/21 2136

## 2021-07-19 NOTE — ED Provider Notes (Addendum)
[facility]    CSN: [id number] Arrival date & time: 07/19/21  8466     History   Chief Complaint Chief Complaint  Patient presents with   Hypertension    HPI Benjamin [name] is a 61 y.o. male.  Presents with complaints of high blood pressure and ringing in the ears.  Patient has been seen 3 times over the past week for same.  Most recent 2 days ago in the emergency department with negative work-up.  Patient does not have symptoms today.  Denies headache, vision changes, dizziness, chest pain or tightness, shortness of breath, abdominal pain, vomiting/diarrhea.  Had some ringing in the left ear this morning that has resolved.  He has an appointment with ENT in June.  He has an appointment with cardiology tomorrow morning.  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   Femoral-popliteal bypass graft occlusion, 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   PAD (peripheral artery disease) (HCC) 11/13/2019   Callus of foot 07/01/2019   Intermittent claudication (HCC) 01/20/2017   Need for influenza vaccination 01/20/2017   Needs smoking cessation education 09/06/2016   Abnormal echocardiogram 09/05/2016   Abnormal EKG 07/25/2016   Preoperative cardiovascular examination 07/25/2016   Shortness of breath on exertion 07/25/2016   Dyslipidemia, goal LDL below 70 07/25/2016   High blood pressure 07/25/2016   Abnormal chest  x-ray 07/04/2016   Peripheral vascular disease (HCC) 07/04/2016   Decreased pedal pulses 06/24/2016   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 No sig Dz in infrarenal Abd Aorta. Normal Bilat Iliac arteries. R SFA is 100% @ origin - recon in AK-Pop A. R PT A patent. L CFA occluded. L PFA recon @ origin, L SFA occluded w/ recon in AK Pop A.   ABDOMINAL AORTOGRAM W/LOWER EXTREMITY N/A 10/15/2019   Procedure: ABDOMINAL AORTOGRAM W/LOWER EXTREMITY;  Surgeon: Nada Libman, MD;  [facility];  Service: Cardiovascular;  Laterality: N/A;   AMPUTATION Left 10/04/2020   Procedure: LEFT ABOVE KNEE AMPUTATION;  Surgeon: Nada Libman, MD;  Location[facility] OR;  Service: Vascular;  Laterality: Left;   AMPUTATION Left 12/08/2020   Procedure: RIGHT ABOVE KNEE AMPUTATION REVISION;  Surgeon: Chuck Hint, MD;  Location: [facility] OR;  Service: Vascular;  Laterality: Left;   AORTA - BILATERAL FEMORAL ARTERY BYPASS GRAFT N/A 05/08/2020   Procedure: AORTA BIFEMORAL BYPASS GRAFT;  Surgeon: Nada Libman, MD;  Location: [facility];  Service: Vascular;  Laterality: N/A;  COLONOSCOPY  06/2016   never   ENDARTERECTOMY FEMORAL Left 11/14/2019   Procedure: Left groin exploration, Redo left femoral artery exposure;  Surgeon: Nada LibmanBrabham, Vance W, MD;  [facility];  Service: Vascular;  Laterality: Left;   FEMORAL-FEMORAL BYPASS GRAFT N/A 11/13/2019   Procedure: BYPASS GRAFT FEMORAL-FEMORAL ARTERY RIGHT TO LEFT USING HEMASHIELD GOLD GRAFT 8mm x 30cm;  Surgeon: Nada LibmanBrabham, Vance W,  MD;  [facility];  Service: Vascular;  Laterality: N/A;   FEMORAL-POPLITEAL BYPASS GRAFT Left 11/13/2019   Procedure: LEFT FEMORAL BELOW KNEE-POPLITEAL ARTERY USING NON-REVERSED GREATER SAPHENOUS VEIN;  Surgeon: Nada LibmanBrabham, Vance W, MD;  Location: [facility];  Service: Vascular;  Laterality: Left;   FEMORAL-POPLITEAL BYPASS GRAFT Left 05/08/2020   Procedure: LEFT REDO BYPASS GRAFT COMMON FEMORAL- BELOW KNEE POPLITEAL ARTERY USING PROPATEN GRAFT;  Surgeon: Nada LibmanBrabham, Vance W, MD;  Location: [facility];  Service: Vascular;  Laterality: Left;   I & D EXTREMITY Left 09/07/2020   Procedure: IRRIGATION AND DEBRIDEMENT LEFT LEG WITH EXCISION OF LEFT LEG DISTAL BYPASS GRAFT;  Surgeon: Cephus Shellinglark, Christopher J, MD;  Location: [facility];  Service: Vascular;  Laterality: Left;   LOWER EXTREMITY ANGIOGRAM Left 11/14/2019   Procedure: LEFT LOWER EXTREMITY ANGIOGRAM, BYPASS GRAFT ANGIOPLASTY;  Surgeon: Nada LibmanBrabham, Vance W, MD;  Location: [facility];  Service: Vascular;  Laterality: Left;   PERIPHERAL VASCULAR INTERVENTION  10/15/2019   Procedure: PERIPHERAL VASCULAR INTERVENTION;  Surgeon: Nada LibmanBrabham, Vance W, MD;  [facility];  Service: Cardiovascular;;  Rt Iliac   REMOVAL OF GRAFT Left 10/04/2020   Procedure: REMOVAL OF LEFT FEMORAL TO POPITEAL BYPASS GRAFT;  Surgeon: Nada LibmanBrabham, Vance W, MD;  Location: [facility];  Service: Vascular;  Laterality: Left;       Home Medications    Prior to Admission medications   Medication Sig Start Date End Date Taking? Authorizing Provider  acetaminophen (TYLENOL) 325 MG tablet Take 1-2 tablets (325-650 mg total) by mouth every 4 (four) hours as needed for mild pain. 12/25/20   Love, Evlyn KannerPamela S, PA-C  albuterol (VENTOLIN HFA) 108 (90 Base) MCG/ACT inhaler Inhale 1-2 puffs into the lungs every 6 (six) hours as needed for wheezing or shortness of breath. 06/12/21   Tanda RockersGray, Samuel A, DO  amLODipine (NORVASC) 2.5 MG tablet Take 1 tablet (2.5 mg total) by mouth daily. 07/10/21   Raspet, Noberto RetortErin K, PA-C   atorvastatin (LIPITOR) 20 MG tablet Take 1 tablet (20 mg total) by mouth at bedtime. Patient taking differently: Take 20 mg by mouth daily. 06/28/21   Arnette FeltsMoore, Janece, FNP  carvedilol (COREG) 6.25 MG tablet Take 1 tablet (6.25 mg total) by mouth 2 (two) times daily with a meal. 07/05/21 09/03/21  Raulkar, Drema PryKrutika P, MD  cloNIDine (CATAPRES - DOSED IN MG/24 HR) 0.1 mg/24hr patch Place 1 patch (0.1 mg total) onto the skin once a week. 07/05/21   Raulkar, Drema PryKrutika P, MD  clopidogrel (PLAVIX) 75 MG tablet Take 1 tablet (75 mg total) by mouth daily. Patient not taking: Reported on 07/17/2021 05/11/21     diphenhydrAMINE (BENADRYL) 25 MG tablet Take 25 mg by mouth every 6 (six) hours as needed for allergies.    [provider]  docusate sodium (COLACE) 100 MG capsule Take 1 capsule (100 mg total) by mouth daily. Patient not taking: Reported on 06/22/2021 03/16/21   Horton Chinaulkar, Krutika P, MD  gabapentin (NEURONTIN) 600 MG tablet Take 1 tablet (600 mg total) by mouth 2 (two) times daily. Patient taking differently: Take 600 mg by mouth  2 (two) times daily as needed (leg pain). 03/22/21   Raulkar, Drema Pry, MD  guaiFENesin-dextromethorphan (ROBITUSSIN DM) 100-10 MG/5ML syrup Take 15 mLs by mouth every 4 (four) hours as needed for cough. Patient not taking: Reported on 06/22/2021 12/15/20   Lorin Glass, MD  isosorbide mononitrate (IMDUR) 30 MG 24 hr tablet Take 1 tablet (30 mg total) by mouth once daily. 07/14/21     losartan (COZAAR) 50 MG tablet Take 1 tablet (50 mg total) by mouth in the morning and at bedtime. 07/05/21   Raulkar, Drema Pry, MD  methocarbamol (ROBAXIN) 500 MG tablet Take 1 tablet (500 mg total) by mouth every 6 (six) hours as needed for muscle spasms. Patient not taking: Reported on 06/22/2021 12/25/20   Jacquelynn Cree, PA-C  Multiple Vitamin (MULTIVITAMIN WITH MINERALS) TABS tablet Take 1 tablet by mouth daily. 12/16/20   Lorin Glass, MD  nortriptyline (PAMELOR) 50 MG capsule Take 2 capsules  (100 mg total) by mouth at bedtime. 06/30/21   Al Decant, PA-C  senna (SENOKOT) 8.6 MG TABS tablet Take 1 tablet (8.6 mg total) by mouth 2 (two) times daily. Patient not taking: Reported on 06/22/2021 12/25/20   Love, Evlyn Kanner, PA-C    Family History Family History  Problem Relation Age of Onset   Arthritis Mother    COPD Father    Stroke Father    Multiple sclerosis Sister     Social History Social History   Tobacco Use   Smoking status: Former    Packs/day: 1.50    Years: 29.00    Pack years: 43.50    Types: Cigarettes    Quit date: 10/22/2020    Years since quitting: 0.7   Smokeless tobacco: Never   Tobacco comments:    Quit smoking September 2022.  Using Nicoderm patch currently.  Vaping Use   Vaping Use: Never used  Substance Use Topics   Alcohol use: No   Drug use: No     Allergies   Gabapentin, Tetanus toxoids, Aspirin, and Garlic   Review of Systems Review of Systems As per HPI  Physical Exam Triage Vital Signs ED Triage Vitals  Enc Vitals Group     BP 07/19/21 1154 (!) 170/90     Pulse Rate 07/19/21 1154 70     Resp 07/19/21 1154 16     Temp 07/19/21 1154 98.2 F (36.8 C)     Temp Source 07/19/21 1154 Oral     SpO2 07/19/21 1154 96 %     Weight --      Height --      Head Circumference --      Peak Flow --      Pain Score 07/19/21 1304 0     Pain Loc --      Pain Edu? --      Excl. in GC? --    No data found.  Updated Vital Signs BP (!) 171/98 Comment: Notified provider  Pulse 70   Temp 98.2 F (36.8 C) (Oral)   Resp 16   SpO2 96%   Visual Acuity Right Eye Distance:   Left Eye Distance:   Bilateral Distance:    Right Eye Near:   Left Eye Near:    Bilateral Near:     Physical Exam Vitals and nursing note reviewed.  Constitutional:      General: He is not in acute distress.    Appearance: He is well-developed.  HENT:     Right Ear: Tympanic  membrane and ear canal normal.     Left Ear: Tympanic membrane and ear canal  normal.     Mouth/Throat:     Mouth: Mucous membranes are moist.     Pharynx: Oropharynx is clear.  Eyes:     Conjunctiva/sclera: Conjunctivae normal.  Cardiovascular:     Rate and Rhythm: Normal rate and regular rhythm.     Heart sounds: Normal heart sounds.  Pulmonary:     Effort: Pulmonary effort is normal. No respiratory distress.     Breath sounds: Normal breath sounds.  Abdominal:     General: Bowel sounds are normal.     Palpations: Abdomen is soft.     Tenderness: There is no abdominal tenderness.  Musculoskeletal:     Cervical back: Normal range of motion.     Left Lower Extremity: Left leg is amputated above knee.  Lymphadenopathy:     Cervical: No cervical adenopathy.  Neurological:     General: No focal deficit present.     Mental Status: He is alert and oriented to person, place, and time.     Cranial Nerves: No facial asymmetry.     Sensory: Sensation is intact.     Motor: Motor function is intact.     Coordination: Coordination is intact.     UC Treatments / Results  Labs (all labs ordered are listed, but only abnormal results are displayed) Labs Reviewed - No data to display  EKG  Radiology No results found.  Procedures Procedures (including critical care time)  Medications Ordered in UC Medications - No data to display  Initial Impression / Assessment and Plan / UC Course  I have reviewed the triage vital signs and the nursing notes.  Pertinent labs & imaging results that were available during my care of the patient were reviewed by me and considered in my medical decision making (see chart for details.   Physical exam is unremarkable, neuro exam without focal deficit.  Patient pressure 171/98 in clinic today.  States he feels well.  Asymptomatic with no red flags.  At this time recommend that patient follow-up with his primary care regarding blood pressure medicine adjustment if needed.  He does have an appointment with cardiology tomorrow  morning.  He also has ear nose and throat appointment for June.  We discussed return precautions and symptoms to look for that would warrant immediate visit to the emergency room.  Patient agrees to plan and is discharged in stable condition.  Final Clinical Impressions(s) / UC Diagnoses   Final diagnoses:  Asymptomatic hypertension     Discharge Instructions      Please follow-up with your primary care provider regarding your blood pressure.  Continue to take your medicine.    Follow-up with the ear nose and throat doctor regarding the ringing in your ears.  Please return to the urgent care or emergency department if symptoms worsen or do not improve.    ED Prescriptions   None    PDMP not reviewed this encounter.      Nazier Neyhart, Lurena Joiner, [place] 07/19/21 1313

## 2021-07-19 NOTE — Discharge Instructions (Signed)
Please follow-up with your primary care provider regarding your blood pressure.  Continue to take your medicine.    Follow-up with the ear nose and throat doctor regarding the ringing in your ears.  Please return to the urgent care or emergency department if symptoms worsen or do not improve.

## 2021-07-19 NOTE — Progress Notes (Unsigned)
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  Past Medical History    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 No sig Dz in infrarenal Abd Aorta. Normal Bilat Iliac arteries. R SFA is 100% @ origin - recon in AK-Pop A. R PT A patent. L CFA occluded. L PFA recon @ origin, L SFA occluded w/ recon in AK Pop A.   ABDOMINAL AORTOGRAM W/LOWER EXTREMITY N/A 10/15/2019   Procedure: ABDOMINAL AORTOGRAM W/LOWER EXTREMITY;  Surgeon: Nada Libman, MD;  Location: [facility];  Service: Cardiovascular;  Laterality: N/A;   AMPUTATION Left 10/04/2020   Procedure: LEFT ABOVE KNEE AMPUTATION;  Surgeon: Nada Libman, MD;  Location: [facility] OR;  Service: Vascular;  Laterality: Left;   AMPUTATION Left 12/08/2020   Procedure: RIGHT ABOVE KNEE AMPUTATION REVISION;  Surgeon: Chuck Hint, MD;  Location: [facility] OR;  Service: Vascular;  Laterality: Left;   AORTA - BILATERAL FEMORAL ARTERY BYPASS GRAFT N/A 05/08/2020   Procedure: AORTA BIFEMORAL BYPASS GRAFT;  Surgeon: Nada Libman, MD;  Location: [facility] OR;  Service: Vascular;  Laterality: N/A;    COLONOSCOPY  06/2016   never   ENDARTERECTOMY FEMORAL Left 11/14/2019   Procedure: Left groin exploration, Redo left femoral artery exposure;  Surgeon: Nada Libman, MD;  Location: [facility] OR;  Service: Vascular;  Laterality: Left;   FEMORAL-FEMORAL BYPASS GRAFT N/A 11/13/2019   Procedure: BYPASS GRAFT FEMORAL-FEMORAL ARTERY RIGHT TO LEFT USING HEMASHIELD GOLD GRAFT 61mm x 30cm;  Surgeon: Nada Libman, MD;  Location: [facility] OR;  Service: Vascular;  Laterality: N/A;   FEMORAL-POPLITEAL BYPASS GRAFT Left 11/13/2019   Procedure: LEFT FEMORAL BELOW KNEE-POPLITEAL ARTERY USING NON-REVERSED GREATER SAPHENOUS VEIN;  Surgeon: Nada Libman, MD;  Location: [facility] OR;  Service: Vascular;  Laterality: Left;   FEMORAL-POPLITEAL BYPASS GRAFT Left 05/08/2020   Procedure: LEFT REDO BYPASS GRAFT COMMON FEMORAL- BELOW KNEE POPLITEAL ARTERY USING PROPATEN GRAFT;  Surgeon: Nada Libman, MD;  Location: [facility] OR;  Service: Vascular;  Laterality: Left;   I & D EXTREMITY Left 09/07/2020   Procedure: IRRIGATION AND DEBRIDEMENT LEFT LEG WITH EXCISION OF LEFT LEG DISTAL BYPASS GRAFT;  Surgeon: Cephus Shelling, MD;  Location: [facility] OR;  Service: Vascular;  Laterality: Left;   LOWER EXTREMITY ANGIOGRAM Left 11/14/2019   Procedure: LEFT LOWER EXTREMITY ANGIOGRAM, BYPASS GRAFT ANGIOPLASTY;  Surgeon: Nada Libman, MD;  Location: [facility] OR;  Service: Vascular;  Laterality: Left;   PERIPHERAL VASCULAR INTERVENTION  10/15/2019   Procedure: PERIPHERAL VASCULAR INTERVENTION;  Surgeon: Nada Libman, MD;  Location: [facility];  Service: Cardiovascular;;  Rt Iliac   REMOVAL  OF GRAFT Left 10/04/2020   Procedure: REMOVAL OF LEFT FEMORAL TO POPITEAL BYPASS GRAFT;  Surgeon: Nada Libman, MD;  Location: [facility] OR;  Service: Vascular;  Laterality: Left;    Allergies  Allergies  Allergen Reactions   Gabapentin Other (See Comments)    Causes lethargy   Tetanus Toxoids Other (See Comments)    Tinnitus started after he took  this   Aspirin Nausea And Vomiting   Garlic Other (See Comments)    Sneezing     History of Present Illness   61 year old male with the above past medical history including chest pain, hypertension, hyperlipidemia, PAD s/p multiple interventions 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   He has a history of heavy tobacco use and PAD s/p multiple interventions, follows with vascular surgery.  He ultimately underwent left AKA in October 2022. He was last seen in the office on 10/29/2019 for preoperative clearance for left leg femorofemoral bypass.  He was stable from a cardiac standpoint at the time.  He underwent stress testing at the time which showed showed no evidence of ischemia, low risk, EF 45-50%.  Has not been seen in the office since. He has recently had multiple ED visits setting of chest pain, shortness of breath, hypertension, and ringing in his ears, recently on 07/19/2021.  Work-up to date including coronary CT angiogram, troponin, EKG, and chest x-ray have all been  within normal limits.  He presents today for follow-up.  Since his last visit he has been stable from a cardiac standpoint.  He states that he has only noted chest tightness with elevated BP.  He has had BP readings ranging from 150s to 170s SBP at home.  Additionally, when his BP is elevated, he notices a fullness/ringing in his ears. He denies exertional symptoms concerning for angina, denies dyspnea, edema, PND, orthopnea.  He notes occasional fleeting palpitations mostly at nighttime, no associated symptoms. Other than his elevated BP and recent chest tightness he denies any additional concerns today.  Home Medications    Current Outpatient Medications  Medication Sig Dispense Refill   acetaminophen (TYLENOL) 325 MG tablet Take 1-2 tablets (325-650 mg total) by mouth every 4 (four) hours as needed for mild pain. 200 tablet 0   albuterol (VENTOLIN HFA) 108 (90 Base) MCG/ACT inhaler  Inhale 1-2 puffs into the lungs every 6 (six) hours as needed for wheezing or shortness of breath. 18 g 0   amLODipine (NORVASC) 2.5 MG tablet Take 2 tablets (5 mg total) by mouth daily. 60 tablet 3   atorvastatin (LIPITOR) 20 MG tablet Take 1 tablet (20 mg total) by mouth at bedtime. (Patient taking differently: Take 20 mg by mouth daily.) 90 tablet 1   carvedilol (COREG) 6.25 MG tablet Take 1 tablet (6.25 mg total) by mouth 2 (two) times daily with a meal. 120 tablet 0   cloNIDine (CATAPRES - DOSED IN MG/24 HR) 0.1 mg/24hr patch Place 1 patch (0.1 mg total) onto the skin once a week. 4 patch 12   clopidogrel (PLAVIX) 75 MG tablet Take 1 tablet (75 mg total) by mouth daily. 30 tablet 0   diphenhydrAMINE (BENADRYL) 25 MG tablet Take 25 mg by mouth every 6 (six) hours as needed for allergies.     docusate sodium (COLACE) 100 MG capsule Take 1 capsule (100 mg total) by mouth daily. 30 capsule 0   gabapentin (NEURONTIN) 600 MG tablet Take 1 tablet (600 mg total) by mouth 2 (  two) times daily. (Patient taking differently: Take 600 mg by mouth 2 (two) times daily as needed (leg pain).) 60 tablet 3   guaiFENesin-dextromethorphan (ROBITUSSIN DM) 100-10 MG/5ML syrup Take 15 mLs by mouth every 4 (four) hours as needed for cough. 118 mL 0   isosorbide mononitrate (IMDUR) 30 MG 24 hr tablet Take 1 tablet (30 mg total) by mouth once daily. 30 tablet 4   losartan (COZAAR) 50 MG tablet Take 1 tablet (50 mg total) by mouth in the morning and at bedtime. 180 tablet 1   methocarbamol (ROBAXIN) 500 MG tablet Take 1 tablet (500 mg total) by mouth every 6 (six) hours as needed for muscle spasms. 30 tablet 0   Multiple Vitamin (MULTIVITAMIN WITH MINERALS) TABS tablet Take 1 tablet by mouth daily.     nortriptyline (PAMELOR) 50 MG capsule Take 2 capsules (100 mg total) by mouth at bedtime. 30 capsule 0   senna (SENOKOT) 8.6 MG TABS tablet Take 1 tablet (8.6 mg total) by mouth 2 (two) times daily. 60 tablet 0   carvedilol  (COREG) 12.5 MG tablet take 1 tablet by mouth twcie a day 60 tablet 2   No current facility-administered medications for this visit.     Review of Systems    He denies chest pain, palpitations, dyspnea, pnd, orthopnea, n, v, dizziness, syncope, edema, weight gain, or early satiety. All other systems reviewed and are otherwise negative except as noted above.   Physical Exam    VS:  BP 136/86 (BP Location: Left Arm, Patient Position: Sitting, Cuff Size: Large)   Pulse 84   Resp 20   Ht 5\' 8"  (1.727 m)   Wt 159 lb (72.1 kg)   SpO2 94%   BMI 24.18 kg/m   GEN: Well nourished, well developed, in no acute distress. HEENT: normal. Neck: Supple, no JVD, carotid bruits, or masses. Cardiac: RRR, no murmurs, rubs, or gallops. No clubbing, cyanosis, edema.  Radials/DP/PT 2+ and equal bilaterally.  Respiratory:  Respirations regular and unlabored, clear to auscultation bilaterally. GI: Soft, nontender, nondistended, BS + x 4. MS: no deformity or atrophy. L AKA.  Skin: warm and dry, no rash. Neuro:  Strength and sensation are intact. Psych: Normal affect.  Accessory Clinical Findings    ECG personally reviewed by me today - No EKG in office today.  Lab Results  Component Value Date   WBC 6.7 07/19/2021   HGB 12.2 (L) 07/19/2021   HCT 38.3 (L) 07/19/2021   MCV 70.1 (L) 07/19/2021   PLT 377 07/19/2021   Lab Results  Component Value Date   CREATININE 0.97 07/19/2021   BUN 8 07/19/2021   NA 138 07/19/2021   K 3.5 07/19/2021   CL 108 07/19/2021   CO2 21 (L) 07/19/2021   Lab Results  Component Value Date   ALT 15 06/30/2021   AST 18 06/30/2021   ALKPHOS 150 (H) 06/30/2021   BILITOT 0.5 06/30/2021   Lab Results  Component Value Date   CHOL 193 02/24/2021   HDL 48 02/24/2021   LDLCALC 118 (H) 02/24/2021   TRIG 152 (H) 02/24/2021   CHOLHDL 4.0 02/24/2021    Lab Results  Component Value Date   HGBA1C 6.1 (H) 02/24/2021    Assessment & Plan    1. Hypertension: BP  slightly above goal.  He has noted SBP in the 150s-170s at home with associated chest tightness, ringing/fullness in his ears. Will increase amlodipine to 5 mg daily (he was just started on amlodipine  2.5 mg daily). I advised him to reduce his sodium intake.  He will continue to monitor BP at home and report BP consistently >130/80.  Continue amlodipine at increased dose, carvedilol, isosorbide, and losartan.  If BP remains elevated above goal, could consider switching from losartan to valsartan.  2. Atypical chest pain: Stress test in 2021 was low risk, EF 45-50%.  He notes recent chest tightness only associated with elevated BP.  He denies exertional symptoms concerning for angina.  No indication for ischemic evaluation at this time.  However, if chest tightness persists despite better BP control, could consider coronary CTA for further risk stratification. Reviewed ED precautions. Continue amlodipine as above, carvedilol, losartan, isosorbide, Lipitor.  3. Hyperlipidemia: LDL was 118 in 02/2021. Monitored and managed per PCP. Continue Lipitor.   4. PAD: S/p multiple interventions, s/p L AKA in 11/2020,  follows with vascular surgery.  Continue Plavix, Lipitor.  5. Tobacco use: He states he has quit smoking.  Encouraged ongoing cessation.  6. Disposition: Follow-up in 1 month.   Joylene Grapes, NP 07/20/2021, 11:13 AM

## 2021-07-19 NOTE — ED Notes (Signed)
Patient states he just wants his blood pressure and didn't necessarily want to be seen. States his ride is coming and he is leaving.

## 2021-07-19 NOTE — ED Triage Notes (Signed)
This is the fifth visit over the last week, patient seen for the same complaint. Reports his ears ringing, feels like his BP is going up and down.

## 2021-07-19 NOTE — ED Notes (Signed)
Called twice, not in lobby or outside.

## 2021-07-20 ENCOUNTER — Ambulatory Visit (INDEPENDENT_AMBULATORY_CARE_PROVIDER_SITE_OTHER): Payer: Medicaid Other | Admitting: Nurse Practitioner

## 2021-07-20 ENCOUNTER — Encounter: Payer: Self-pay | Admitting: Nurse Practitioner

## 2021-07-20 ENCOUNTER — Encounter (HOSPITAL_COMMUNITY): Payer: Self-pay | Admitting: Emergency Medicine

## 2021-07-20 ENCOUNTER — Emergency Department (HOSPITAL_COMMUNITY)
Admission: EM | Admit: 2021-07-20 | Discharge: 2021-07-20 | Disposition: A | Discharge status: Home / Self Care | Payer: Medicaid Other | Attending: Emergency Medicine | Admitting: Emergency Medicine

## 2021-07-20 ENCOUNTER — Other Ambulatory Visit: Payer: Self-pay

## 2021-07-20 VITALS — BP 136/86 | HR 84 | Resp 20 | Ht 68.0 in | Wt 159.0 lb

## 2021-07-20 DIAGNOSIS — Z79899 Other long term (current) drug therapy: Secondary | ICD-10-CM | POA: Insufficient documentation

## 2021-07-20 DIAGNOSIS — R0789 Other chest pain: Secondary | ICD-10-CM

## 2021-07-20 DIAGNOSIS — I10 Essential (primary) hypertension: Secondary | ICD-10-CM | POA: Insufficient documentation

## 2021-07-20 DIAGNOSIS — R079 Chest pain, unspecified: Secondary | ICD-10-CM | POA: Diagnosis present

## 2021-07-20 DIAGNOSIS — F17200 Nicotine dependence, unspecified, uncomplicated: Secondary | ICD-10-CM

## 2021-07-20 DIAGNOSIS — I739 Peripheral vascular disease, unspecified: Secondary | ICD-10-CM | POA: Diagnosis not present

## 2021-07-20 DIAGNOSIS — E785 Hyperlipidemia, unspecified: Secondary | ICD-10-CM

## 2021-07-20 DIAGNOSIS — D649 Anemia, unspecified: Secondary | ICD-10-CM | POA: Diagnosis not present

## 2021-07-20 LAB — TROPONIN I (HIGH SENSITIVITY): Troponin I (High Sensitivity): 6 ng/L (ref <18)

## 2021-07-20 MED ORDER — LIDOCAINE VISCOUS HCL 2 % MT SOLN
15.0000 mL | Freq: Once | OROMUCOSAL | Status: AC
Start: 2021-07-20 — End: 2021-07-20
  Administered 2021-07-20: 15 mL via ORAL
  Filled 2021-07-20: qty 15

## 2021-07-20 MED ORDER — AMLODIPINE BESYLATE 2.5 MG PO TABS
5.0000 mg | ORAL_TABLET | Freq: Every day | ORAL | 3 refills | Status: DC
  Filled 2021-07-20: qty 60, 30d supply, fill #0

## 2021-07-20 MED ORDER — CARVEDILOL 12.5 MG PO TABS
ORAL_TABLET | ORAL | 2 refills | Status: DC
  Filled 2021-07-20: qty 60, 30d supply, fill #0

## 2021-07-20 MED ORDER — ALUM & MAG HYDROXIDE-SIMETH 200-200-20 MG/5ML PO SUSP
30.0000 mL | Freq: Once | ORAL | Status: AC
Start: 2021-07-20 — End: 2021-07-20
  Administered 2021-07-20: 30 mL via ORAL
  Filled 2021-07-20: qty 30

## 2021-07-20 NOTE — Patient Instructions (Signed)
Medication Instructions:  Increase Amlodipine 5 mg daily  *If you need a refill on your cardiac medications before your next appointment, please call your pharmacy*   Lab Work: NONE ordered at this time of appointment   If you have labs (blood work) drawn today and your tests are completely normal, you will receive your results only by: MyChart Message (if you have MyChart) OR A paper copy in the mail If you have any lab test that is abnormal or we need to change your treatment, we will call you to review the results.   Testing/Procedures: NONE ordered at this time of appointment     Follow-Up: At [facility], you and your health needs are our priority.  As part of our continuing mission to provide you with exceptional heart care, we have created designated Provider Care Teams.  These Care Teams include your primary Cardiologist (physician) and Advanced Practice Providers (APPs -  Physician Assistants and Nurse Practitioners) who all work together to provide you with the care you need, when you need it.  We recommend signing up for the patient portal called "MyChart".  Sign up information is provided on this After Visit Summary.  MyChart is used to connect with patients for Virtual Visits (Telemedicine).  Patients are able to view lab/test results, encounter notes, upcoming appointments, etc.  Non-urgent messages can be sent to your provider as well.   To learn more about what you can do with MyChart, go to [email or web address].    Your next appointment:   1 month(s)  The format for your next appointment:   In Person  Provider:   Bryan Lemma, MD     Other Instructions   Important Information About Sugar

## 2021-07-20 NOTE — Discharge Instructions (Addendum)
You were seen in the emergency department today for chest pain. Your work-up in the emergency department has been overall reassuring. Your labs have been fairly normal and or similar to previous blood work you have had done. Your EKG and the enzyme we use to check your heart did not show an acute heart attack at this time. Your chest x-ray was normal.   Follow attached diet guidelines.   We would like you to follow up closely with your primary care provider and/or the cardiologist provided in your discharge instructions within 1-3 days, have your blood pressure rechecked at this appointment. Return to the ER immediately should you experience any new or worsening symptoms including but not limited to return of pain, worsened pain, vomiting, shortness of breath, dizziness, lightheadedness, passing out, or any other concerns that you may have.

## 2021-07-20 NOTE — ED Provider Notes (Signed)
[name] EMERGENCY DEPARTMENT Provider Note   CSN: [id number] Arrival date & time: 07/20/21  0249     History  Chief Complaint  Patient presents with   Chest Pain    Benjamin [name] is a 61 y.o. male with a history of hypertension, hyperlipidemia, and peripheral arterial disease who returns to the ED via EMS with complaints of chest pain x 8 hours. Pain is to the central upper chest, burning in nature, no alleviating/aggravating factors. He is worried it is his BP. He was here earlier tonight but went home as he thought it would get better. Has been taking his meds as prescribed. Denies nausea, vomiting, diaphoresis, syncope, dizziness, or hemoptysis.   HPI     Home Medications Prior to Admission medications   Medication Sig Start Date End Date Taking? Authorizing Provider  acetaminophen (TYLENOL) 325 MG tablet Take 1-2 tablets (325-650 mg total) by mouth every 4 (four) hours as needed for mild pain. 12/25/20   Love, Evlyn Kanner, PA-C  albuterol (VENTOLIN HFA) 108 (90 Base) MCG/ACT inhaler Inhale 1-2 puffs into the lungs every 6 (six) hours as needed for wheezing or shortness of breath. 06/12/21   Tanda Rockers A, DO  amLODipine (NORVASC) 2.5 MG tablet Take 1 tablet (2.5 mg total) by mouth daily. 07/10/21   Raspet, Noberto Retort, PA-C  atorvastatin (LIPITOR) 20 MG tablet Take 1 tablet (20 mg total) by mouth at bedtime. Patient taking differently: Take 20 mg by mouth daily. 06/28/21   Arnette Felts, FNP  carvedilol (COREG) 6.25 MG tablet Take 1 tablet (6.25 mg total) by mouth 2 (two) times daily with a meal. 07/05/21 09/03/21  Raulkar, Drema Pry, MD  cloNIDine (CATAPRES - DOSED IN MG/24 HR) 0.1 mg/24hr patch Place 1 patch (0.1 mg total) onto the skin once a week. 07/05/21   Raulkar, Drema Pry, MD  clopidogrel (PLAVIX) 75 MG tablet Take 1 tablet (75 mg total) by mouth daily. Patient not taking: Reported on 07/17/2021 05/11/21     diphenhydrAMINE (BENADRYL) 25 MG tablet Take 25 mg by  mouth every 6 (six) hours as needed for allergies.    [provider]  docusate sodium (COLACE) 100 MG capsule Take 1 capsule (100 mg total) by mouth daily. Patient not taking: Reported on 06/22/2021 03/16/21   Horton Chin, MD  gabapentin (NEURONTIN) 600 MG tablet Take 1 tablet (600 mg total) by mouth 2 (two) times daily. Patient taking differently: Take 600 mg by mouth 2 (two) times daily as needed (leg pain). 03/22/21   Raulkar, Drema Pry, MD  guaiFENesin-dextromethorphan (ROBITUSSIN DM) 100-10 MG/5ML syrup Take 15 mLs by mouth every 4 (four) hours as needed for cough. Patient not taking: Reported on 06/22/2021 12/15/20   Lorin Glass, MD  isosorbide mononitrate (IMDUR) 30 MG 24 hr tablet Take 1 tablet (30 mg total) by mouth once daily. 07/14/21     losartan (COZAAR) 50 MG tablet Take 1 tablet (50 mg total) by mouth in the morning and at bedtime. 07/05/21   Raulkar, Drema Pry, MD  methocarbamol (ROBAXIN) 500 MG tablet Take 1 tablet (500 mg total) by mouth every 6 (six) hours as needed for muscle spasms. Patient not taking: Reported on 06/22/2021 12/25/20   Jacquelynn Cree, PA-C  Multiple Vitamin (MULTIVITAMIN WITH MINERALS) TABS tablet Take 1 tablet by mouth daily. 12/16/20   Lorin Glass, MD  nortriptyline (PAMELOR) 50 MG capsule Take 2 capsules (100 mg total) by mouth at bedtime. 06/30/21   Delice Bison  F, PA-C  senna (SENOKOT) 8.6 MG TABS tablet Take 1 tablet (8.6 mg total) by mouth 2 (two) times daily. Patient not taking: Reported on 06/22/2021 12/25/20   Jacquelynn CreeLove, Pamela S, PA-C      Allergies    Gabapentin, Tetanus toxoids, Aspirin, and Garlic    Review of Systems   Review of Systems  Constitutional:  Negative for chills, diaphoresis and fever.  Respiratory:  Negative for shortness of breath.   Cardiovascular:  Positive for chest pain.  Gastrointestinal:  Negative for abdominal pain, nausea and vomiting.  Neurological:  Negative for syncope.  All other systems reviewed and are  negative.  Physical Exam Updated Vital Signs BP (!) 155/85 (BP Location: Left Arm)   Pulse 75   Temp 98.4 F (36.9 C) (Oral)   Resp 20   Ht 5\' 8"  (1.727 m)   Wt 68 kg   SpO2 94%   BMI 22.81 kg/m  Physical Exam Vitals and nursing note reviewed.  Constitutional:      General: He is not in acute distress.    Appearance: He is well-developed. He is not toxic-appearing.  HENT:     Head: Normocephalic and atraumatic.  Eyes:     General:        Right eye: No discharge.        Left eye: No discharge.     Conjunctiva/sclera: Conjunctivae normal.  Cardiovascular:     Rate and Rhythm: Normal rate and regular rhythm.     Pulses:          Radial pulses are 2+ on the right side and 2+ on the left side.  Pulmonary:     Effort: No respiratory distress.     Breath sounds: Normal breath sounds. No wheezing or rales.  Abdominal:     General: There is no distension.     Palpations: Abdomen is soft.     Tenderness: There is no abdominal tenderness. There is no guarding or rebound.  Musculoskeletal:     Cervical back: Neck supple.     Comments: LLE AKA.   Skin:    General: Skin is warm and dry.  Neurological:     Mental Status: He is alert.     Comments: Clear speech.   Psychiatric:        Behavior: Behavior normal.    ED Results / Procedures / Treatments   Labs (all labs ordered are listed, but only abnormal results are displayed) Labs Reviewed - No data to display  EKG EKG Interpretation  Date/Time:  Tuesday Jul 20 2021 03:00:05 EDT Ventricular Rate:  79 PR Interval:  134 QRS Duration: 94 QT Interval:  374 QTC Calculation: 429 R Axis:   -41 Text Interpretation: Sinus rhythm Left axis deviation Borderline repolarization abnormality Confirmed by Nicanor AlconPalumbo, April ([id number]) on 07/20/2021 3:04:24 AM  Radiology DG Chest 2 View  Result Date: 07/19/2021 CLINICAL DATA:  Chest pain, tinnitus EXAM: CHEST - 2 VIEW COMPARISON:  07/17/2021 FINDINGS: Frontal and lateral views of the  chest demonstrate a stable cardiac silhouette. Stable background emphysema and bibasilar scarring. No airspace disease, effusion, or pneumothorax. No acute bony abnormalities. IMPRESSION: 1. Stable emphysema.  No acute process. Electronically Signed   By: Sharlet SalinaMichael  Brown M.D.   On: 07/19/2021 22:15    Procedures Procedures    Medications Ordered in ED Medications - No data to display  ED Course/ Medical Decision Making/ A&P  Medical Decision Making Risk OTC drugs. Prescription drug management.  Patient presents to the emergency department with chest pain. Patient nontoxic appearing, in no apparent distress, vitals w/ elevated BP- doubt HTN emergency, SpO2 documented by RN 91%--> 97% on RA while examining the patient. Fairly benign physical exam.   DDX including but not limited to: ACS, pulmonary embolism, dissection, pneumothorax, pneumonia, arrhythmia, severe anemia, MSK, GERD, anxiety, abdominal process.   Additional history obtained:  Chart & nursing note reviewed.  Multiple ED visits for similar in the past.  Reviewed most recent CTA of the chest 07/17/21- no CT evidence for acute pulmonary embolus. Aortic atherosclerosis & emphysema.   EKG: Sinus rhythm Left axis deviation Borderline repolarization abnormality   Patient seen in the ED earlier tonight for same complaint, reports to me that he has had pain for 8 hours now (prior to his initial ED visit). Labs drawn earlier tonight reviewed:  CBC: mild anemia similar to prior.  BMP: Fairy unremarkable.  Troponin: 7 CXR performed earlier tonight viewed: 1. Stable emphysema.  No acute process.   Repeat EKG currently without acute ischemic changes.  Delta troponin is flat.   Heart Pathway Score 4- EKG without obvious acute ischemia, delta troponin negative, low suspicion for ACS- will provide information for cardiology follow up however. Patient is low risk wells, low suspicion for pulmonary embolism. Pain  is not a tearing sensation, symmetric pulses, no widening of mediastinum on CXR, doubt dissection. Labs & CXR reassuring. Abdomen nontender. Patient feeling improved some S/p GI cocktail.   Based on patient's chief complaint, I considered admission might be necessary, however after reassuring ED workup feel patient is reasonable for discharge.   I discussed results, treatment plan, need for follow-up, and return precautions with the patient. Provided opportunity for questions, patient confirmed understanding and is in agreement with plan.   Discussed w/ attending.   Portions of this note were generated with Scientist, clinical (histocompatibility and immunogenetics). Dictation errors may occur despite best attempts at proofreading.   Final Clinical Impression(s) / ED Diagnoses Final diagnoses:  Chest pain, unspecified type    Rx / DC Orders ED Discharge Orders     None         Cherly Anderson, PA-C 07/20/21 0504    Palumbo, April, MD 07/20/21 0507

## 2021-07-20 NOTE — ED Triage Notes (Signed)
Pt reports high blood pressure despite taking medications (150/110) and central chest tightness starting approx 1 hour ago.

## 2021-07-20 NOTE — ED Notes (Signed)
Patient verbalizes understanding of discharge instructions. Opportunity for questioning and answers were provided. Armband removed by staff, pt discharged from ED. Pt taken to ED waiting room via wheel chair.  

## 2021-07-21 ENCOUNTER — Ambulatory Visit: Payer: Medicaid Other | Admitting: Occupational Therapy

## 2021-07-21 ENCOUNTER — Other Ambulatory Visit: Payer: Self-pay

## 2021-07-21 ENCOUNTER — Ambulatory Visit: Payer: Medicaid Other

## 2021-07-22 ENCOUNTER — Ambulatory Visit: Payer: Medicaid Other

## 2021-07-22 ENCOUNTER — Emergency Department (HOSPITAL_COMMUNITY)
Admission: EM | Admit: 2021-07-22 | Discharge: 2021-07-22 | Disposition: A | Discharge status: Home / Self Care | Payer: Medicaid Other | Attending: Emergency Medicine | Admitting: Emergency Medicine

## 2021-07-22 ENCOUNTER — Other Ambulatory Visit: Payer: Self-pay

## 2021-07-22 ENCOUNTER — Emergency Department (HOSPITAL_COMMUNITY): Payer: Medicaid Other

## 2021-07-22 ENCOUNTER — Encounter (HOSPITAL_COMMUNITY): Payer: Self-pay | Admitting: Emergency Medicine

## 2021-07-22 DIAGNOSIS — R079 Chest pain, unspecified: Secondary | ICD-10-CM | POA: Diagnosis not present

## 2021-07-22 DIAGNOSIS — Z79899 Other long term (current) drug therapy: Secondary | ICD-10-CM | POA: Diagnosis not present

## 2021-07-22 DIAGNOSIS — R1012 Left upper quadrant pain: Secondary | ICD-10-CM | POA: Insufficient documentation

## 2021-07-22 DIAGNOSIS — I10 Essential (primary) hypertension: Secondary | ICD-10-CM | POA: Insufficient documentation

## 2021-07-22 DIAGNOSIS — R1013 Epigastric pain: Secondary | ICD-10-CM | POA: Diagnosis present

## 2021-07-22 DIAGNOSIS — F17200 Nicotine dependence, unspecified, uncomplicated: Secondary | ICD-10-CM | POA: Insufficient documentation

## 2021-07-22 LAB — TROPONIN I (HIGH SENSITIVITY)
Troponin I (High Sensitivity): 6 ng/L (ref <18)
Troponin I (High Sensitivity): 5 ng/L (ref <18)

## 2021-07-22 LAB — CBC
HCT: 35.4 % — ABNORMAL LOW (ref 39.0–52.0)
Hemoglobin: 11.6 g/dL — ABNORMAL LOW (ref 13.0–17.0)
MCH: 22.7 pg — ABNORMAL LOW (ref 26.0–34.0)
MCHC: 32.8 g/dL (ref 30.0–36.0)
MCV: 69.1 fL — ABNORMAL LOW (ref 80.0–100.0)
Platelets: 334 10*3/uL (ref 150–400)
RBC: 5.12 MIL/uL (ref 4.22–5.81)
RDW: 18.6 % — ABNORMAL HIGH (ref 11.5–15.5)
WBC: 6.8 10*3/uL (ref 4.0–10.5)
nRBC: 0 % (ref 0.0–0.2)

## 2021-07-22 LAB — BASIC METABOLIC PANEL
Anion gap: 8 (ref 5–15)
BUN: 9 mg/dL (ref 8–23)
CO2: 21 mmol/L — ABNORMAL LOW (ref 22–32)
Calcium: 8.7 mg/dL — ABNORMAL LOW (ref 8.9–10.3)
Chloride: 107 mmol/L (ref 98–111)
Creatinine, Ser: 0.99 mg/dL (ref 0.61–1.24)
GFR, Estimated: >60 mL/min (ref >60)
Glucose, Bld: 118 mg/dL — ABNORMAL HIGH (ref 70–99)
Potassium: 3.5 mmol/L (ref 3.5–5.1)
Sodium: 136 mmol/L (ref 135–145)

## 2021-07-22 LAB — HEPATIC FUNCTION PANEL
ALT: 12 U/L (ref 0–44)
AST: 16 U/L (ref 15–41)
Albumin: 3.6 g/dL (ref 3.5–5.0)
Alkaline Phosphatase: 140 U/L — ABNORMAL HIGH (ref 38–126)
Bilirubin, Direct: <0.1 mg/dL (ref 0.0–0.2)
Total Bilirubin: 0.3 mg/dL (ref 0.3–1.2)
Total Protein: 6.6 g/dL (ref 6.5–8.1)

## 2021-07-22 LAB — LIPASE, BLOOD: Lipase: 23 U/L (ref 11–51)

## 2021-07-22 MED ORDER — ALUM & MAG HYDROXIDE-SIMETH 200-200-20 MG/5ML PO SUSP
30.0000 mL | Freq: Once | ORAL | Status: AC
  Administered 2021-07-22: 30 mL via ORAL
  Filled 2021-07-22: qty 30

## 2021-07-22 MED ORDER — FAMOTIDINE 20 MG PO TABS
20.0000 mg | ORAL_TABLET | Freq: Every day | ORAL | 0 refills | Status: DC
  Filled 2021-07-22: qty 30, 30d supply, fill #0

## 2021-07-22 MED ORDER — LIDOCAINE VISCOUS HCL 2 % MT SOLN
15.0000 mL | Freq: Once | OROMUCOSAL | Status: AC
  Administered 2021-07-22: 15 mL via ORAL
  Filled 2021-07-22: qty 15

## 2021-07-22 NOTE — ED Provider Triage Note (Signed)
  Emergency Medicine Provider Triage Evaluation Note  MRN:  [id number]  Arrival date & time: 07/22/21    Medically screening exam initiated at 2:35 AM.   CC:   Chest Pain   HPI:  Benjamin [name] is a 61 y.o. year-old male presents to the ED with chief complaint of chest pain that he associates with his high blood pressure.  States that it keeps him from sleeping.  Seen recently for the same.  History provided by History provided by patient. ROS:  -As included in HPI PE:  There were no vitals filed for this visit.  Non-toxic appearing No respiratory distress  MDM:   I've ordered labs in triage to expedite lab/diagnostic workup.  Patient was informed that the remainder of the evaluation will be completed by another provider, this initial triage assessment does not replace that evaluation, and the importance of remaining in the ED until their evaluation is complete.    Roxy Horseman, PA-C 07/22/21 [id number]

## 2021-07-22 NOTE — ED Provider Notes (Signed)
[facility] EMERGENCY DEPARTMENT Provider Note  CSN: [id number] Arrival date & time: 07/22/21 [id number]  Chief Complaint(s) Chest Pain  HPI Benjamin [name] is a 61 y.o. male with a past medical history listed below including hypertension, hyperlipidemia, smoker who presents to the emergency department with complaint of chest pain.  When asked to locate his pain the patient points to his epigastrium.  He reports that this is intermittent.  States that he was seen yesterday for chest pain.  There is no alleviating or aggravating factors.  He reports that he was diagnosed with GERD.  States that he has not been eating any acidic or heavy foods.  Denies any alcohol consumption.  No nausea or vomiting.  Pain does not radiate up into his chest or his back.  Denied any other physical complaints.   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   Unilateral AKA, left ([place]) 12/15/2020   Protein-calorie malnutrition, severe 12/09/2020   Wound cellulitis 12/07/2020   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/2020   Ischemia of lower extremity 10/01/2020   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   Femoral-popliteal bypass graft occlusion, 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   PAD (peripheral artery disease) ([place]) 11/13/2019   Callus of foot 07/01/2019   Intermittent claudication ([place]) 01/20/2017   Need for influenza vaccination 01/20/2017   Needs smoking cessation education 09/06/2016   Abnormal echocardiogram 09/05/2016   Abnormal EKG 07/25/2016   Preoperative cardiovascular examination 07/25/2016   Shortness of breath on exertion 07/25/2016    Dyslipidemia, goal LDL below 70 07/25/2016   High blood pressure 07/25/2016   Abnormal chest x-ray 07/04/2016   Peripheral vascular disease ([place]) 07/04/2016   Decreased pedal pulses 06/24/2016   Varicose vein of leg 06/24/2016   Heavy smoker 06/24/2016   Abnormal weight loss 06/24/2016   Foot pain, left 06/24/2016   Screening for prostate cancer 06/24/2016   Home Medication(s) Prior to Admission medications   Medication Sig Start Date End Date Taking? Authorizing Provider  famotidine (PEPCID) 20 MG tablet Take 1 tablet (20 mg total) by mouth daily. 07/22/21 08/21/21 Yes Marvella Jenning, Grayce Sessions, MD  acetaminophen (TYLENOL) 325 MG tablet Take 1-2 tablets (325-650 mg total) by mouth every 4 (four) hours as needed for mild pain. 12/25/20   Love, Ivan Anchors, PA-C  albuterol (VENTOLIN HFA) 108 (90 Base) MCG/ACT inhaler Inhale 1-2 puffs into the lungs every 6 (six) hours as needed for wheezing or shortness of breath. 06/12/21   Wynona Dove A, DO  amLODipine (NORVASC) 2.5 MG tablet Take 2 tablets (5 mg total) by mouth daily. 07/20/21   Lenna Sciara, NP  atorvastatin (LIPITOR) 20 MG tablet Take 1 tablet (20 mg total) by mouth at bedtime. Patient taking differently: Take 20 mg by mouth daily. 06/28/21   [name], FNP  carvedilol (COREG) 12.5 MG tablet take 1 tablet by mouth twcie a day 07/16/21     carvedilol (COREG) 6.25 MG tablet Take 1 tablet (6.25 mg total) by mouth 2 (two) times daily with a meal. 07/05/21 09/03/21  Raulkar, Clide Deutscher, MD  cloNIDine (CATAPRES -  DOSED IN MG/24 HR) 0.1 mg/24hr patch Place 1 patch (0.1 mg total) onto the skin once a week. 07/05/21   Raulkar, Clide Deutscher, MD  clopidogrel (PLAVIX) 75 MG tablet Take 1 tablet (75 mg total) by mouth daily. 05/11/21     diphenhydrAMINE (BENADRYL) 25 MG tablet Take 25 mg by mouth every 6 (six) hours as needed for allergies.    [provider]  docusate sodium (COLACE) 100 MG capsule Take 1 capsule (100 mg total) by mouth daily. 03/16/21    Raulkar, Clide Deutscher, MD  gabapentin (NEURONTIN) 600 MG tablet Take 1 tablet (600 mg total) by mouth 2 (two) times daily. Patient taking differently: Take 600 mg by mouth 2 (two) times daily as needed (leg pain). 03/22/21   Raulkar, Clide Deutscher, MD  guaiFENesin-dextromethorphan (ROBITUSSIN DM) 100-10 MG/5ML syrup Take 15 mLs by mouth every 4 (four) hours as needed for cough. 12/15/20   Terrilee Croak, MD  isosorbide mononitrate (IMDUR) 30 MG 24 hr tablet Take 1 tablet (30 mg total) by mouth once daily. 07/14/21     losartan (COZAAR) 50 MG tablet Take 1 tablet (50 mg total) by mouth in the morning and at bedtime. 07/05/21   Raulkar, Clide Deutscher, MD  methocarbamol (ROBAXIN) 500 MG tablet Take 1 tablet (500 mg total) by mouth every 6 (six) hours as needed for muscle spasms. 12/25/20   Love, Ivan Anchors, PA-C  Multiple Vitamin (MULTIVITAMIN WITH MINERALS) TABS tablet Take 1 tablet by mouth daily. 12/16/20   Terrilee Croak, MD  nortriptyline (PAMELOR) 50 MG capsule Take 2 capsules (100 mg total) by mouth at bedtime. 06/30/21   Azucena Cecil, PA-C  senna (SENOKOT) 8.6 MG TABS tablet Take 1 tablet (8.6 mg total) by mouth 2 (two) times daily. 12/25/20   Love, Ivan Anchors, PA-C                                                                                                                                    Allergies Gabapentin, Tetanus toxoids, Aspirin, and Garlic  Review of Systems Review of Systems  Cardiovascular:  Positive for chest pain.  As noted in HPI  Physical Exam Vital Signs  I have reviewed the triage vital signs BP (!) 165/96   Pulse 66   Temp 97.9 F (36.6 C) (Oral)   Resp 14   SpO2 99%   Physical Exam Vitals reviewed.  Constitutional:      General: He is not in acute distress.    Appearance: He is well-developed. He is not diaphoretic.  HENT:     Head: Normocephalic and atraumatic.     Nose: Nose normal.  Eyes:     General: No scleral icterus.       Right eye: No discharge.         Left eye: No discharge.     Conjunctiva/sclera: Conjunctivae normal.     Pupils: Pupils are equal, round, and  reactive to light.  Cardiovascular:     Rate and Rhythm: Normal rate and regular rhythm.     Heart sounds: No murmur heard.   No friction rub. No gallop.  Pulmonary:     Effort: Pulmonary effort is normal. No respiratory distress.     Breath sounds: Normal breath sounds. No stridor. No rales.  Abdominal:     General: There is no distension.     Palpations: Abdomen is soft.     Tenderness: There is abdominal tenderness in the left upper quadrant. There is no guarding or rebound.  Musculoskeletal:        General: No tenderness.     Cervical back: Normal range of motion and neck supple.       Legs:  Skin:    General: Skin is warm and dry.     Findings: No erythema or rash.  Neurological:     Mental Status: He is alert and oriented to person, place, and time.    ED Results and Treatments Labs (all labs ordered are listed, but only abnormal results are displayed) Labs Reviewed  BASIC METABOLIC PANEL - Abnormal; Notable for the following components:      Result Value   CO2 21 (*)    Glucose, Bld 118 (*)    Calcium 8.7 (*)    All other components within normal limits  CBC - Abnormal; Notable for the following components:   Hemoglobin 11.6 (*)    HCT 35.4 (*)    MCV 69.1 (*)    MCH 22.7 (*)    RDW 18.6 (*)    All other components within normal limits  HEPATIC FUNCTION PANEL - Abnormal; Notable for the following components:   Alkaline Phosphatase 140 (*)    All other components within normal limits  LIPASE, BLOOD  TROPONIN I (HIGH SENSITIVITY)  TROPONIN I (HIGH SENSITIVITY)                                                                                                                         EKG  EKG Interpretation  Date/Time:  Thursday July 22 2021 02:38:18 EDT Ventricular Rate:  70 PR Interval:  142 QRS Duration: 82 QT Interval:  402 QTC Calculation: 434 R  Axis:   2 Text Interpretation: Normal sinus rhythm with sinus arrhythmia ST & T wave abnormality, consider inferolateral ischemia Abnormal ECG No acute changes When compared with ECG of 20-Jul-2021 03:00, PREVIOUS ECG IS PRESENT Confirmed by Addison Lank [phone]) on 07/22/2021 4:16:38 AM       Radiology DG Chest 2 View  Result Date: 07/22/2021 CLINICAL DATA:  Chest pain EXAM: CHEST - 2 VIEW COMPARISON:  07/19/2021 FINDINGS: Heart and mediastinal contours are within normal limits. No focal opacities or effusions. No acute bony abnormality. IMPRESSION: No active cardiopulmonary disease. Electronically Signed   By: Rolm Baptise M.D.   On: 07/22/2021 03:03    Pertinent labs & imaging results that were available during my care of the  patient were reviewed by me and considered in my medical decision making (see MDM for details).  Medications Ordered in ED Medications  alum & mag hydroxide-simeth (MAALOX/MYLANTA) 200-200-20 MG/5ML suspension 30 mL (30 mLs Oral Given 07/22/21 0453)    And  lidocaine (XYLOCAINE) 2 % viscous mouth solution 15 mL (15 mLs Oral Given 07/22/21 0453)                                                                                                                                     Procedures Procedures  (including critical care time)  Medical Decision Making / ED Course    Complexity of Problem:  Co-morbidities/SDOH that complicate the patient evaluation/care: Noted above in HPI  Patient's presenting problem/concern, DDX, and MDM listed below: Epigastric pain Patient with left upper quadrant abdominal tenderness Feel that this is likely gastritis.  However given patient's history and comorbidities, will rule out pancreatitis, biliary obstruction and ACS.  Hospitalization Considered:  No     Complexity of Data:   Cardiac Monitoring: The patient was maintained on a cardiac monitor.   I personally viewed and interpreted the cardiac monitored which showed an  underlying rhythm of normal sinus rhythm EKG without acute ischemic changes or evidence of pericarditis  Laboratory Tests ordered listed below with my independent interpretation: CBC without leukocytosis.  Stable hemoglobin. No significant electrolyte derangements or renal insufficiency. No evidence of biliary obstruction or pancreatitis. Serial troponins negative x2   Imaging Studies ordered listed below with my independent interpretation: Chest x-ray without evidence of pneumonia, pneumothorax, pulmonary edema     ED Course:    Assessment, Add'l Intervention, and Reassessment: Chest pain Actually epigastric/LUQ Work-up is reassuring  Pain iresolved after GI cocktail.  He was able to tolerate oral intake   Final Clinical Impression(s) / ED Diagnoses Final diagnoses:  Epigastric pain   The patient appears reasonably screened and/or stabilized for discharge and I doubt any other medical condition or other [facility] requiring further screening, evaluation, or treatment in the ED at this time prior to discharge. Safe for discharge with strict return precautions.  Disposition: Discharge  Condition: Good  I have discussed the results, Dx and Tx plan with the patient/family who expressed understanding and agree(s) with the plan. Discharge instructions discussed at length. The patient/family was given strict return precautions who verbalized understanding of the instructions. No further questions at time of discharge.    ED Discharge Orders          Ordered    famotidine (PEPCID) 20 MG tablet  Daily        07/22/21 0716             Follow Up: Dianna Rossetti, NP [place] [place] [address] [place] [address] [phone]  Call  to schedule an appointment for close follow up           This chart was dictated using voice recognition software.  Despite best efforts to proofread,  errors can occur which can change the documentation meaning.    Fatima Blank,  MD 07/22/21 [phone]

## 2021-07-22 NOTE — ED Triage Notes (Signed)
Patient here with hight blood pressure and chest pain.  He describes it as a tightness in his chest.  He states that he has some mild shortness of breath.  Patient states that his pressure is high and the chest pain persists after taking his medications.

## 2021-07-23 ENCOUNTER — Ambulatory Visit: Payer: Medicaid Other

## 2021-07-23 ENCOUNTER — Encounter: Payer: Self-pay | Admitting: Occupational Therapy

## 2021-07-23 ENCOUNTER — Ambulatory Visit: Payer: Medicaid Other | Attending: Nurse Practitioner | Admitting: Occupational Therapy

## 2021-07-23 VITALS — BP 189/94 | HR 94

## 2021-07-23 DIAGNOSIS — M6281 Muscle weakness (generalized): Secondary | ICD-10-CM | POA: Insufficient documentation

## 2021-07-23 DIAGNOSIS — R293 Abnormal posture: Secondary | ICD-10-CM | POA: Insufficient documentation

## 2021-07-23 DIAGNOSIS — R2681 Unsteadiness on feet: Secondary | ICD-10-CM | POA: Insufficient documentation

## 2021-07-23 DIAGNOSIS — R2689 Other abnormalities of gait and mobility: Secondary | ICD-10-CM

## 2021-07-23 NOTE — Therapy (Signed)
OUTPATIENT PHYSICAL THERAPY PROSTHETIC TREATMENT NOTE   Patient Name: Benjamin [name] MRN: [id number] DOB:Dec 13, 1960, 61 y.o., male Today's Date: 07/23/2021  PCP: Levonne Lapping, NP REFERRING PROVIDER: Arnette Felts, FNP   PT End of Session - 07/23/21 1334     Visit Number 8    Number of Visits 16    Date for PT Re-Evaluation 08/03/21    Authorization Type [employer] (15 visits)    Authorization Time Period 10 vists from 4/2- 07/23/21    Authorization - Visit Number 6    Authorization - Number of Visits 10    Progress Note Due on Visit 16    PT Start Time 1320    PT Stop Time 1400    PT Time Calculation (min) 40 min    Equipment Utilized During Treatment Gait belt    Activity Tolerance Patient tolerated treatment well    Behavior During Therapy [facility] for tasks assessed/performed             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 No sig Dz in infrarenal Abd Aorta. Normal Bilat Iliac arteries. R SFA is 100% @ origin - recon in AK-Pop A. R PT A patent. L CFA occluded. L PFA recon @ origin, L SFA occluded w/ recon in AK Pop A.   ABDOMINAL AORTOGRAM W/LOWER EXTREMITY N/A 10/15/2019   Procedure: ABDOMINAL AORTOGRAM W/LOWER EXTREMITY;  Surgeon: Nada Libman, MD;  Location: [facility] INVASIVE CV LAB;  Service: Cardiovascular;  Laterality: N/A;   AMPUTATION Left 10/04/2020   Procedure: LEFT ABOVE KNEE AMPUTATION;  Surgeon: Nada Libman, MD;  Location: [facility] OR;  Service: Vascular;  Laterality: Left;   AMPUTATION  Left 12/08/2020   Procedure: RIGHT ABOVE KNEE AMPUTATION REVISION;  Surgeon: Chuck Hint, MD;  Location: [facility] OR;  Service: Vascular;  Laterality: Left;   AORTA - BILATERAL FEMORAL ARTERY BYPASS GRAFT N/A 05/08/2020   Procedure: AORTA BIFEMORAL BYPASS GRAFT;  Surgeon: Nada Libman, MD;  Location: [facility] OR;  Service: Vascular;  Laterality: N/A;   COLONOSCOPY  06/2016   never   ENDARTERECTOMY FEMORAL Left 11/14/2019   Procedure: Left groin exploration, Redo left femoral artery exposure;  Surgeon: Nada Libman, MD;  Location: [facility] OR;  Service: Vascular;  Laterality: Left;   FEMORAL-FEMORAL BYPASS GRAFT N/A 11/13/2019   Procedure: BYPASS GRAFT FEMORAL-FEMORAL ARTERY RIGHT TO LEFT USING HEMASHIELD GOLD GRAFT 21mm x 30cm;  Surgeon: Nada Libman, MD;  Location: [facility] OR;  Service: Vascular;  Laterality: N/A;   FEMORAL-POPLITEAL BYPASS GRAFT Left 11/13/2019   Procedure: LEFT FEMORAL BELOW KNEE-POPLITEAL ARTERY USING NON-REVERSED GREATER SAPHENOUS VEIN;  Surgeon: Nada Libman, MD;  Location: [facility] OR;  Service: Vascular;  Laterality: Left;   FEMORAL-POPLITEAL BYPASS GRAFT Left 05/08/2020   Procedure: LEFT REDO BYPASS GRAFT COMMON FEMORAL- BELOW KNEE POPLITEAL ARTERY USING PROPATEN GRAFT;  Surgeon: Nada Libman, MD;  Location: [facility] OR;  Service: Vascular;  Laterality: Left;   I & D EXTREMITY Left 09/07/2020   Procedure: IRRIGATION AND DEBRIDEMENT  LEFT LEG WITH EXCISION OF LEFT LEG DISTAL BYPASS GRAFT;  Surgeon: Cephus Shellinglark, Christopher J, MD;  [facility];  Service: Vascular;  Laterality: Left;   LOWER EXTREMITY ANGIOGRAM Left 11/14/2019   Procedure: LEFT LOWER EXTREMITY ANGIOGRAM, BYPASS GRAFT ANGIOPLASTY;  Surgeon: Nada LibmanBrabham, Vance W, MD;  Location: [facility] OR;  Service: Vascular;  Laterality: Left;   PERIPHERAL VASCULAR INTERVENTION  10/15/2019   Procedure: PERIPHERAL VASCULAR INTERVENTION;  Surgeon: Nada LibmanBrabham, Vance W, MD;  Location: [facility] IN[facility] LAB;  Service: Cardiovascular;;  Rt Iliac    REMOVAL OF GRAFT Left 10/04/2020   Procedure: REMOVAL OF LEFT FEMORAL TO POPITEAL BYPASS GRAFT;  Surgeon: Nada LibmanBrabham, Vance W, MD;  Location: [facility] OR;  Service: Vascular;  Laterality: Left;   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   Unilateral AKA, left (HCC) 12/15/2020   Protein-calorie malnutrition, severe 12/09/2020   Wound cellulitis 12/07/2020   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/2020   Ischemia of lower extremity 10/01/2020   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   Femoral-popliteal bypass graft occlusion, 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   PAD (peripheral artery disease) (HCC) 11/13/2019   Callus of foot 07/01/2019   Intermittent claudication (HCC) 01/20/2017   Need for influenza vaccination 01/20/2017   Needs smoking cessation education 09/06/2016   Abnormal echocardiogram 09/05/2016   Abnormal EKG 07/25/2016   Preoperative cardiovascular examination 07/25/2016   Shortness of breath on exertion 07/25/2016   Dyslipidemia, goal LDL below 70 07/25/2016   High blood pressure 07/25/2016   Abnormal chest x-ray 07/04/2016   Peripheral vascular disease (HCC) 07/04/2016   Decreased pedal pulses 06/24/2016   Varicose vein of leg 06/24/2016   Heavy smoker 06/24/2016   Abnormal weight loss 06/24/2016   Foot pain, left 06/24/2016   Screening for prostate cancer 06/24/2016    ONSET DATE: 12/08/2020   REFERRING DIAG: W09.811BS78.112A (ICD-10-CM) - Unilateral AKA, left (HCC)    THERAPY DIAG:  Other abnormalities of gait and mobility - Plan: PT plan of care cert/re-cert   Unsteadiness on feet - Plan: PT plan of care cert/re-cert   Hx of AKA (above knee amputation), left (HCC) - Plan: PT plan of care cert/re-cert   PERTINENT HISTORY: PMH significant for HTN, HLD, heavy smoking, peripheral artery disease s/p bilateral femoral artery bypass graft   There were no vitals filed  for this visit.    SUBJECTIVE STATEMENT: Pt reports his BP was high today with OT session. He was havin some chest pain and stomach pain this morning.   PAIN:  Are you having pain? Yes NPRS scale: 7/10 Pain location: left residual limb Pain orientation: Left  PAIN TYPE: chronic, phantom Pain description:  throbbing, phantom pains   Aggravating factors: immobility Relieving factors: movement, walking with prosthesis     TODAY'S TREATMENT:  Pt educated on getting PCP who can manage his BP ongoing basiss. Pt reports that he has called the number that we gave him and he is just waiting to hear back from them. BP: 165/95 Gait training: 1 x 115' with RW, cues for small step with L and getting taller when in L stance phase and taking longer step with R LE to improve hip extensions on L LE.    Pt needed to rest.   BP measured 151/88 Stair training: 12 steps with bil hand rail, verbal and tactile cues to shift L pelvis forward during eccentric control to  improve locking of L knee, SBA/CGA BP at end of the session 145/95      PATIENT EDUCATION: Education details:continue to monitor BP, continue with current HEP.  Person educated: Patient Education method: Explanation Education comprehension: verbalized understanding     HOME EXERCISE PROGRAM: Access Code: [email or web address] URL: [email or web address] Date: 07/07/2021 Prepared by: Harriet Butte  Exercises - Lateral Weight Shift with Parallel Bars (AKA)  - 1 x daily - 7 x weekly - 3 sets - 10 reps - Lateral Weight Shift with Arm Raise and Walker  - 1 x daily - 7 x weekly - 3 sets - 10 reps - Alternating Step Forward with Support  - 1 x daily - 7 x weekly - 3 sets - 10 reps        GOALS: Goals reviewed with patient? Yes   SHORT TERM GOALS: Target date: 07/06/2021   Patient will be able to ambulate 230 feet with RW and SBA to improve functional mobility Baseline: 115' 06/08/21 Goal status: INITIAL   2.  Patient will  be able to go up and down ramp and curb with RW and SBA to improve access to community. Baseline: not assessed 06/08/21 Goal status: INITIAL   3.  Patient will demo proper sock management to improve comfort, endurance and safety while using prosthesis with ADLs and functional walking. Baseline: Education initiated 06/08/21 Goal status: INITIAL     LONG TERM GOALS: Target date: 08/03/2021   Patient will be able to ambulate >500 feet in 6 minutes with RW to improve functional endurance with walking  Baseline: not assessed 06/08/21 Goal status: INITIAL   2.  Patient will demo gait speed of 0.30 m/s with RW to improve community ambulation. Baseline: 0.74m/s with RW 06/08/21 Goal status: INITIAL   3.  Patient will demo TUG score of <60 seconds with use of RW to improve functional mobility Baseline: 99 sec with RW Goal status: INITIAL   4.  Patient will be able to ambulate 230' with quad cane or LRAD to improve community mobility Baseline: 115' RW Goal status: INITIAL  5. Patient will demo >35/56 on BBS to improve functional standing balance. Baseline: 28/56 (07/09/21) Goal status: added on 07/09/21     ASSESSMENT:   CLINICAL IMPRESSION: Pt's BP went down systolically after functional activities today. Pt is demonstrating improving weight shift to L LE during eccentric stair control but requires more practice to perform it safely without supervision.      OBJECTIVE IMPAIRMENTS Abnormal gait, cardiopulmonary status limiting activity, decreased activity tolerance, decreased balance, decreased endurance, decreased mobility, difficulty walking, decreased ROM, decreased strength, decreased safety awareness, hypomobility, increased fascial restrictions, impaired flexibility, improper body mechanics, postural dysfunction, prosthetic dependency , and pain.    ACTIVITY LIMITATIONS cleaning, community activity, laundry, and yard work.    PERSONAL FACTORS Past/current experiences, Time since onset  of injury/illness/exacerbation, and 1-2 comorbidities: PAD, HTN, HLD  are also affecting patient's functional outcome.      REHAB POTENTIAL: Good   CLINICAL DECISION MAKING: Stable/uncomplicated   EVALUATION COMPLEXITY: Low   PLAN: PT FREQUENCY: 2x/week   PT DURATION: 8 weeks   PLANNED INTERVENTIONS: Therapeutic exercises, Therapeutic activity, Neuromuscular re-education, Balance training, Gait training, Patient/Family education, Joint manipulation, Joint mobilization, Orthotic/Fit training, Prosthetic training, Cryotherapy, Moist heat, scar mobilization, and Manual therapy   PLAN FOR NEXT SESSION:  Work on R closed chain stability on R hip during stance phase to improve step length on L,  work on standing balance, stairs  Ileana Ladd, PT 07/23/2021, 1:34 PM

## 2021-07-23 NOTE — Therapy (Signed)
OUTPATIENT OCCUPATIONAL THERAPY TREATMENT NOTE & OT DISCHARGE   Patient Name: Benjamin [name] MRN: [id number] DOB:01-25-1961, 61 y.o., male Today's Date: 07/23/2021  PCP: Dianna Rossetti, NP REFERRING PROVIDER: Izora Ribas, MD   END OF SESSION:   OT End of Session - 07/23/21 1234     Visit Number 7    Number of Visits 17    Date for OT Re-Evaluation 08/03/21    Authorization Type [place] [employer] - approved 11 OT visits    Authorization Time Period 06/16/21 - 07/23/21    Authorization - Visit Number 6    Authorization - Number of Visits 11    OT Start Time 1232    OT Stop Time 1300    OT Time Calculation (min) 28 min    Activity Tolerance Patient tolerated treatment well    Behavior During Therapy [facility] for tasks assessed/performed             Past Medical History:  Diagnosis Date   Back pain    Heavy smoker    Hyperlipidemia LDL goal <70    Hypertension    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 No sig Dz in infrarenal Abd Aorta. Normal Bilat Iliac arteries. R SFA is 100% @ origin - recon in AK-Pop A. R PT A patent. L CFA occluded. L PFA recon @ origin, L SFA occluded w/ recon in AK Pop A.   ABDOMINAL AORTOGRAM W/LOWER EXTREMITY N/A 10/15/2019   Procedure: ABDOMINAL AORTOGRAM W/LOWER EXTREMITY;  Surgeon: Serafina Mitchell, MD;  Location: [place] [facility];  Service: Cardiovascular;  Laterality: N/A;   AMPUTATION Left 10/04/2020   Procedure: LEFT ABOVE KNEE AMPUTATION;  Surgeon: Serafina Mitchell, MD;  Location: [place];  Service: Vascular;  Laterality: Left;   AMPUTATION Left 12/08/2020   Procedure: RIGHT ABOVE KNEE  AMPUTATION REVISION;  Surgeon: Angelia Mould, MD;  Location[facility];  Service: Vascular;  Laterality: Left;   AORTA - BILATERAL FEMORAL ARTERY BYPASS GRAFT N/A 05/08/2020   Procedure: AORTA BIFEMORAL BYPASS GRAFT;  Surgeon: Serafina Mitchell, MD;  [facility];  Service: Vascular;  Laterality: N/A;   COLONOSCOPY  06/2016   never   ENDARTERECTOMY FEMORAL Left 11/14/2019   Procedure: Left groin exploration, Redo left femoral artery exposure;  Surgeon: Serafina Mitchell, MD;  [facility];  Service: Vascular;  Laterality: Left;   FEMORAL-FEMORAL BYPASS GRAFT N/A 11/13/2019   Procedure: BYPASS GRAFT FEMORAL-FEMORAL ARTERY RIGHT TO LEFT USING HEMASHIELD GOLD GRAFT 79m x 30cm;  Surgeon: BSerafina Mitchell MD;  [facility];  Service: Vascular;  Laterality: N/A;   FEMORAL-POPLITEAL BYPASS GRAFT Left 11/13/2019   Procedure: LEFT FEMORAL BELOW KNEE-POPLITEAL ARTERY USING NON-REVERSED GREATER SAPHENOUS VEIN;  Surgeon: BSerafina Mitchell MD;  [facility];  Service: Vascular;  Laterality: Left;   FEMORAL-POPLITEAL BYPASS GRAFT Left 05/08/2020   Procedure: LEFT REDO BYPASS GRAFT COMMON FEMORAL- BELOW KNEE POPLITEAL ARTERY USING PROPATEN GRAFT;  Surgeon: BSerafina Mitchell MD;  [facility];  Service: Vascular;  Laterality: Left;   I & D EXTREMITY Left 09/07/2020   Procedure: IRRIGATION AND DEBRIDEMENT LEFT LEG WITH EXCISION OF LEFT LEG DISTAL  BYPASS GRAFT;  Surgeon: Marty Heck, MD;  Location: [place];  Service: Vascular;  Laterality: Left;   LOWER EXTREMITY ANGIOGRAM Left 11/14/2019   Procedure: LEFT LOWER EXTREMITY ANGIOGRAM, BYPASS GRAFT ANGIOPLASTY;  Surgeon: Serafina Mitchell, MD;  [facility];  Service: Vascular;  Laterality: Left;   PERIPHERAL VASCULAR INTERVENTION  10/15/2019   Procedure: PERIPHERAL VASCULAR INTERVENTION;  Surgeon: Serafina Mitchell, MD;  Location: [place] [facility];  Service: Cardiovascular;;  Rt Iliac   REMOVAL OF GRAFT Left 10/04/2020   Procedure:  REMOVAL OF LEFT FEMORAL TO POPITEAL BYPASS GRAFT;  Surgeon: Serafina Mitchell, MD;  [facility];  Service: Vascular;  Laterality: Left;   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   Unilateral AKA, left ([place]) 12/15/2020   Protein-calorie malnutrition, severe 12/09/2020   Wound cellulitis 12/07/2020   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/2020   Ischemia of lower extremity 10/01/2020   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   Femoral-popliteal bypass graft occlusion, left ([place]) 11/13/2019   PAD (peripheral artery disease) ([place]) 11/13/2019   Callus of foot 07/01/2019   Intermittent claudication ([place]) 01/20/2017   Need for influenza vaccination 01/20/2017   Needs smoking cessation education 09/06/2016   Abnormal echocardiogram 09/05/2016   Abnormal EKG 07/25/2016   Preoperative cardiovascular examination 07/25/2016   Shortness of breath on exertion 07/25/2016   Dyslipidemia, goal LDL below 70 07/25/2016   High blood pressure 07/25/2016   Abnormal chest x-ray 07/04/2016   Peripheral vascular disease ([place]) 07/04/2016   Decreased pedal pulses 06/24/2016   Varicose vein of leg 06/24/2016   Heavy smoker 06/24/2016   Abnormal weight loss 06/24/2016   Foot pain, left 06/24/2016   Screening for prostate cancer 06/24/2016    ONSET DATE: 12/08/20 (Lt AKA revision)  REFERRING DIAG: D42.876O (ICD-10-CM) - Unilateral AKA, left (HCC)   THERAPY DIAG:  Muscle weakness (generalized)  Other abnormalities of gait and mobility  Abnormal posture  Unsteadiness on feet   PERTINENT HISTORY: necrotic left above-knee amputation site. 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 PMH includes PAD, HLD, HTN, abnormal glucose.   PRECAUTIONS: **HTN, fall risk, Lt AKA w/ prosthestic  SUBJECTIVE: Pt reports still needing to get his prosthesis cut down more and reports will be going today. "I  can't get in and out of my car with it on"   PAIN:  Are you having pain? No but reports mild pressure in chest.      OBJECTIVE:   TODAY'S TREATMENT:  Today's Vitals   07/23/21 1236 07/23/21 1242 07/23/21 1246 07/23/21 1252  BP: (!) 191/98 (!) 165/102 (!) 180/93 (!) 189/94  Pulse: 87 86 79 94     There is no height or weight on file to calculate BMI.   Pt left prosthesis in car - therapist went to fetch prosthesis for patient to practice with donning.  LB Dressing Reviewed and practiced donning prosthesis with mod I and verbal cues for sequencing and efficiency. Pt completed donning pants and shoes after prosthesis - again mod I with verbal cues for strategies.  OCCUPATIONAL THERAPY DISCHARGE SUMMARY  Visits from Start of Care: 7  Current functional level related to goals / functional outcomes: Pt with increased skills and independence with ADLs after therapeutic interventions.   Remaining deficits: Continues to be limited by high blood pressure and resources.   Education / Equipment: HEPs for proximal strengthening and  Equipment recommendations   Patient agrees to discharge. Patient goals were met. Patient is being discharged due to meeting the stated rehab goals.Marland Kitchen     HOME EXERCISE PROGRAM/ PATIENT EDUCATION: 06/16/21:  sequencing for LE dressing w/ prosthesis, tub transfer w/ bench, and A/E and DME recommendations  06/23/21: BUE strengthening HEP for posterior sh girdle  GOALS: Goals reviewed with patient? Yes   SHORT TERM GOALS: Target date: 07/06/2021   Independent with UB strengthening HEP  Baseline: Dependent Goal status: ACHIEVED   2.  Pt will verbalize understanding of DME and A/E to increase safety and independence with ADLS Baseline: Dependent Goal status: MET (tub bench and walker basket or tray handouts issued)   3.  Pt will perform LE dressing w/ prosthesis on I'ly w/ AE prn Baseline: dependent with prosthesis Goal status: MET   LONG TERM  GOALS: Target date: 08/03/2021   Pt to perform dynamic standing tasks/IADLS using prosthesis (washing dishes, folding clothes, light cleaning) Baseline: DEPENDENT Goal status: ACHIEVED   2.  Pt to wear prosthesis at least 50% of the time during the day for ADLS/IADLS Baseline: Not currently wearing Goal status: NOT MET pt wearing approx 20% of the day per pt report   3. Pt will simulate tub transfer with proper DME safety and independently             Baseline: unable - does not have proper DME Goal status: ACHIEVED - practiced in clinic w/o difficulty 06/16/21, pt has not received at home yet     ASSESSMENT:   CLINICAL IMPRESSION: Pt has met all OT goals and is discharging from OT at this time.   PERFORMANCE DEFICITS in functional skills including ADLs, IADLs, strength, mobility, balance, body mechanics, endurance, and decreased knowledge of use of DME   IMPAIRMENTS are limiting patient from ADLs and IADLs.    COMORBIDITIES may have co-morbidities  that affects occupational performance. Patient will benefit from skilled OT to address above impairments and improve overall function.   MODIFICATION OR ASSISTANCE TO COMPLETE EVALUATION: No modification of tasks or assist necessary to complete an evaluation.   OT OCCUPATIONAL PROFILE AND HISTORY: Problem focused assessment: Including review of records relating to presenting problem.   CLINICAL DECISION MAKING: Moderate - several treatment options, min-mod task modification necessary   REHAB POTENTIAL: Good   EVALUATION COMPLEXITY: Low     PLAN: OT FREQUENCY: 2x/week   OT DURATION: 8 weeks (anticipate only 6 weeks)   PLANNED INTERVENTIONS: self care/ADL training, therapeutic exercise, therapeutic activity, neuromuscular re-education, functional mobility training, patient/family education, energy conservation, coping strategies training, and DME and/or AE instructions   RECOMMENDED OTHER SERVICES: none   CONSULTED AND AGREED  WITH PLAN OF CARE: Patient   PLAN FOR NEXT SESSION: OT d/c   Managed medicaid CPT codes: [phone]- Therapeutic Exercise, [phone]- Neuro Re-education, [phone] - Therapeutic Activities, [phone] - Self Care, and [phone] - Prosthetic training      Zachery Conch, OT 07/23/2021, 1:01 PM

## 2021-07-27 ENCOUNTER — Encounter (HOSPITAL_COMMUNITY): Payer: Self-pay | Admitting: *Deleted

## 2021-07-27 ENCOUNTER — Ambulatory Visit (HOSPITAL_COMMUNITY)
Admission: EM | Admit: 2021-07-27 | Discharge: 2021-07-27 | Disposition: A | Discharge status: Home / Self Care | Payer: Medicaid Other | Attending: Family Medicine | Admitting: Family Medicine

## 2021-07-27 DIAGNOSIS — Z0130 Encounter for examination of blood pressure without abnormal findings: Secondary | ICD-10-CM

## 2021-07-27 DIAGNOSIS — I10 Essential (primary) hypertension: Secondary | ICD-10-CM

## 2021-07-27 NOTE — Discharge Instructions (Addendum)
Move your losartan bedtime dose to evening.

## 2021-07-27 NOTE — ED Provider Notes (Addendum)
[place]    CSN: [id number] Arrival date & time: 07/27/21  0813      History   Chief Complaint Chief Complaint  Patient presents with   BP check    HPI Benjamin [name] is a 61 y.o. male.   HPI Here for htn. Had been checking his bp at home, but last did so 2 days ago because of the home cuff not working. Had been varying 120s/80s to 160s/112  Currently current dosing is as follows:  Carvedilol 6.25 mg AM and bedtime Losartan 50 mg AM and bedtime Clonidine patch 0.1 weekly  He has not picked up 12.5 mg carvedilol, and he has amlodipine 2.5 but is not taking.  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   Femoral-popliteal bypass graft occlusion, left ([place]) 11/13/2019   PAD (peripheral artery disease) ([place]) 11/13/2019   Callus of foot 07/01/2019   Intermittent claudication ([place]) 01/20/2017   Need for influenza vaccination 01/20/2017   Needs smoking cessation education 09/06/2016   Abnormal echocardiogram 09/05/2016   Abnormal EKG 07/25/2016   Preoperative cardiovascular examination 07/25/2016   Shortness of breath on exertion 07/25/2016   Dyslipidemia, goal LDL below 70 07/25/2016   High blood pressure 07/25/2016   Abnormal chest x-ray 07/04/2016   Peripheral vascular disease ([place]) 07/04/2016   Decreased pedal pulses 06/24/2016   Varicose vein of leg 06/24/2016   Heavy smoker  06/24/2016   Abnormal weight loss 06/24/2016   Foot pain, left 06/24/2016   Screening for prostate cancer 06/24/2016    Past Surgical History:  Procedure Laterality Date   ABDOMINAL AORTOGRAM W/LOWER EXTREMITY N/A 07/12/2016   Procedure: Abdominal Aortogram w/Lower Extremity;  Surgeon: Serafina Mitchell, MD;  Location: [place] [facility];  Service: Cardiovascular;  Laterality: N/A;  Bilateral extermity: Patent Renal As. No sig Dz in infrarenal Abd Aorta. Normal Bilat Iliac arteries. R SFA is 100% @ origin - recon in AK-Pop A. R PT A patent. L CFA occluded. L PFA recon @ origin, L SFA occluded w/ recon in AK Pop A.   ABDOMINAL AORTOGRAM W/LOWER EXTREMITY N/A 10/15/2019   Procedure: ABDOMINAL AORTOGRAM W/LOWER EXTREMITY;  Surgeon: Serafina Mitchell, MD;  Location: [place] [facility];  Service: Cardiovascular;  Laterality: N/A;   AMPUTATION Left 10/04/2020   Procedure: LEFT ABOVE KNEE AMPUTATION;  Surgeon: Serafina Mitchell, MD;  Location: [place];  Service: Vascular;  Laterality: Left;   AMPUTATION Left 12/08/2020   Procedure: RIGHT ABOVE KNEE AMPUTATION REVISION;  Surgeon: Angelia Mould, MD;  Location: [facility] OR;  Service: Vascular;  Laterality: Left;   AORTA - BILATERAL FEMORAL ARTERY BYPASS GRAFT N/A 05/08/2020   Procedure: AORTA BIFEMORAL BYPASS GRAFT;  Surgeon: Serafina Mitchell, MD;  Location: [place];  Service: Vascular;  Laterality: N/A;   COLONOSCOPY  06/2016   never   ENDARTERECTOMY FEMORAL Left 11/14/2019   Procedure: Left  groin exploration, Redo left femoral artery exposure;  Surgeon: Serafina Mitchell, MD;  [facility];  Service: Vascular;  Laterality: Left;   FEMORAL-FEMORAL BYPASS GRAFT N/A 11/13/2019   Procedure: BYPASS GRAFT FEMORAL-FEMORAL ARTERY RIGHT TO LEFT USING HEMASHIELD GOLD GRAFT 55mm x 30cm;  Surgeon: Serafina Mitchell, MD;  Location: [facility] OR;  Service: Vascular;  Laterality: N/A;   FEMORAL-POPLITEAL BYPASS GRAFT Left 11/13/2019   Procedure: LEFT FEMORAL BELOW  KNEE-POPLITEAL ARTERY USING NON-REVERSED GREATER SAPHENOUS VEIN;  Surgeon: Serafina Mitchell, MD;  [facility];  Service: Vascular;  Laterality: Left;   FEMORAL-POPLITEAL BYPASS GRAFT Left 05/08/2020   Procedure: LEFT REDO BYPASS GRAFT COMMON FEMORAL- BELOW KNEE POPLITEAL ARTERY USING PROPATEN GRAFT;  Surgeon: Serafina Mitchell, MD;  [facility];  Service: Vascular;  Laterality: Left;   I & D EXTREMITY Left 09/07/2020   Procedure: IRRIGATION AND DEBRIDEMENT LEFT LEG WITH EXCISION OF LEFT LEG DISTAL BYPASS GRAFT;  Surgeon: Marty Heck, MD;  Location: [place];  Service: Vascular;  Laterality: Left;   LOWER EXTREMITY ANGIOGRAM Left 11/14/2019   Procedure: LEFT LOWER EXTREMITY ANGIOGRAM, BYPASS GRAFT ANGIOPLASTY;  Surgeon: Serafina Mitchell, MD;  [facility];  Service: Vascular;  Laterality: Left;   PERIPHERAL VASCULAR INTERVENTION  10/15/2019   Procedure: PERIPHERAL VASCULAR INTERVENTION;  Surgeon: Serafina Mitchell, MD;  Location: [place] [facility];  Service: Cardiovascular;;  Rt Iliac   REMOVAL OF GRAFT Left 10/04/2020   Procedure: REMOVAL OF LEFT FEMORAL TO POPITEAL BYPASS GRAFT;  Surgeon: Serafina Mitchell, MD;  [facility];  Service: Vascular;  Laterality: Left;       Home Medications    Prior to Admission medications   Medication Sig Start Date End Date Taking? Authorizing Provider  cloNIDine (CATAPRES - DOSED IN MG/24 HR) 0.1 mg/24hr patch Place 1 patch (0.1 mg total) onto the skin once a week. 07/05/21  Yes Raulkar, Clide Deutscher, MD  clopidogrel (PLAVIX) 75 MG tablet Take 1 tablet (75 mg total) by mouth daily. 05/11/21  Yes   losartan (COZAAR) 50 MG tablet Take 1 tablet (50 mg total) by mouth in the morning and at bedtime. 07/05/21  Yes Raulkar, Clide Deutscher, MD  acetaminophen (TYLENOL) 325 MG tablet Take 1-2 tablets (325-650 mg total) by mouth every 4 (four) hours as needed for mild pain. 12/25/20   Love, Ivan Anchors, PA-C  albuterol (VENTOLIN HFA) 108 (90 Base) MCG/ACT inhaler  Inhale 1-2 puffs into the lungs every 6 (six) hours as needed for wheezing or shortness of breath. 06/12/21   Wynona Dove A, DO  atorvastatin (LIPITOR) 20 MG tablet Take 1 tablet (20 mg total) by mouth at bedtime. Patient taking differently: Take 20 mg by mouth daily. 06/28/21   [name], FNP  carvedilol (COREG) 6.25 MG tablet Take 1 tablet (6.25 mg total) by mouth 2 (two) times daily with a meal. 07/05/21 09/03/21  Raulkar, Clide Deutscher, MD  diphenhydrAMINE (BENADRYL) 25 MG tablet Take 25 mg by mouth every 6 (six) hours as needed for allergies.    [provider]  docusate sodium (COLACE) 100 MG capsule Take 1 capsule (100 mg total) by mouth daily. 03/16/21   Raulkar, Clide Deutscher, MD  famotidine (PEPCID) 20 MG tablet Take 1 tablet (20 mg total) by mouth daily. 07/22/21 08/21/21  Fatima Blank, MD  gabapentin (NEURONTIN) 600 MG tablet Take 1 tablet (600 mg total) by mouth 2 (two) times daily. Patient taking differently: Take 600 mg by mouth 2 (  two) times daily as needed (leg pain). 03/22/21   Raulkar, Clide Deutscher, MD  guaiFENesin-dextromethorphan (ROBITUSSIN DM) 100-10 MG/5ML syrup Take 15 mLs by mouth every 4 (four) hours as needed for cough. 12/15/20   Terrilee Croak, MD  isosorbide mononitrate (IMDUR) 30 MG 24 hr tablet Take 1 tablet (30 mg total) by mouth once daily. 07/14/21     methocarbamol (ROBAXIN) 500 MG tablet Take 1 tablet (500 mg total) by mouth every 6 (six) hours as needed for muscle spasms. 12/25/20   Love, Ivan Anchors, PA-C  Multiple Vitamin (MULTIVITAMIN WITH MINERALS) TABS tablet Take 1 tablet by mouth daily. 12/16/20   Terrilee Croak, MD  nortriptyline (PAMELOR) 50 MG capsule Take 2 capsules (100 mg total) by mouth at bedtime. 06/30/21   Azucena Cecil, PA-C  senna (SENOKOT) 8.6 MG TABS tablet Take 1 tablet (8.6 mg total) by mouth 2 (two) times daily. 12/25/20   Love, Ivan Anchors, PA-C    Family History Family History  Problem Relation Age of Onset   Arthritis Mother    COPD  Father    Stroke Father    Multiple sclerosis Sister     Social History Social History   Tobacco Use   Smoking status: Former    Packs/day: 1.50    Years: 29.00    Pack years: 43.50    Types: Cigarettes    Quit date: 10/22/2020    Years since quitting: 0.7   Smokeless tobacco: Never   Tobacco comments:    Quit smoking September 2022.  Using Nicoderm patch currently.  Vaping Use   Vaping Use: Never used  Substance Use Topics   Alcohol use: No   Drug use: No     Allergies   Gabapentin, Tetanus toxoids, Aspirin, and Garlic   Review of Systems Review of Systems   Physical Exam Triage Vital Signs ED Triage Vitals  Enc Vitals Group     BP 07/27/21 0822 (!) 145/80     Pulse Rate 07/27/21 0822 79     Resp 07/27/21 0822 16     Temp 07/27/21 0822 97.8 F (36.6 C)     Temp Source 07/27/21 0822 Oral     SpO2 07/27/21 0822 94 %     Weight --      Height --      Head Circumference --      Peak Flow --      Pain Score 07/27/21 0823 0     Pain Loc --      Pain Edu? --      Excl. in [place]? --    No data found.  Updated Vital Signs BP (!) 145/80   Pulse 79   Temp 97.8 F (36.6 C) (Oral)   Resp 16   SpO2 94%   Visual Acuity Right Eye Distance:   Left Eye Distance:   Bilateral Distance:    Right Eye Near:   Left Eye Near:    Bilateral Near:     Physical Exam Constitutional:      General: He is not in acute distress.    Appearance: He is not toxic-appearing.  Cardiovascular:     Rate and Rhythm: Normal rate and regular rhythm.  Pulmonary:     Effort: Pulmonary effort is normal.     Breath sounds: Normal breath sounds.  Musculoskeletal:     Comments: BKA left leg  Neurological:     Mental Status: He is alert and oriented to person, place, and time.  Psychiatric:  Behavior: Behavior normal.     UC Treatments / Results  Labs (all labs ordered are listed, but only abnormal results are displayed) Labs Reviewed - No data to  display  EKG   Radiology No results found.  Procedures Procedures (including critical care time)  Medications Ordered in UC Medications - No data to display  Initial Impression / Assessment and Plan / UC Course  I have reviewed the triage vital signs and the nursing notes.  Pertinent labs & imaging results that were available during my care of the patient were reviewed by me and considered in my medical decision making (see chart for details).     He is going to move his losartan dose to suppertime, about 5 PM.  Final Clinical Impressions(s) / UC Diagnoses   Final diagnoses:  Essential hypertension, benign     Discharge Instructions      Move your losartan bedtime dose to evening.      ED Prescriptions   None    PDMP not reviewed this encounter.   Barrett Henle, MD 07/27/21 [id number]    Barrett Henle, MD 07/27/21 [phone]

## 2021-07-27 NOTE — ED Triage Notes (Signed)
Pt reports HTN. When asked what BP reading was, states, "my machine is broken; I just wanted to have it checked". Denies any chest pain or tightness at this time. Denies any sxs.

## 2021-07-28 ENCOUNTER — Other Ambulatory Visit: Payer: Self-pay

## 2021-07-28 ENCOUNTER — Other Ambulatory Visit: Payer: Self-pay | Admitting: Nurse Practitioner

## 2021-07-28 ENCOUNTER — Other Ambulatory Visit: Payer: Self-pay | Admitting: Physical Medicine and Rehabilitation

## 2021-07-28 ENCOUNTER — Encounter: Payer: MEDICAID | Admitting: Nurse Practitioner

## 2021-07-28 MED ORDER — AMLODIPINE BESYLATE 2.5 MG PO TABS
2.5000 mg | ORAL_TABLET | Freq: Every day | ORAL | 0 refills | Status: DC
Start: 2021-07-28 — End: 2021-08-19

## 2021-07-28 MED ORDER — CLONIDINE 0.1 MG/24HR TD PTWK
0.1000 mg | MEDICATED_PATCH | TRANSDERMAL | 0 refills | Status: DC
  Filled 2021-08-02: qty 1, 7d supply, fill #0

## 2021-07-29 ENCOUNTER — Other Ambulatory Visit: Payer: Self-pay

## 2021-07-29 ENCOUNTER — Ambulatory Visit: Payer: Medicaid Other

## 2021-07-30 ENCOUNTER — Other Ambulatory Visit: Payer: Self-pay

## 2021-07-30 MED ORDER — CARVEDILOL 6.25 MG PO TABS
6.2500 mg | ORAL_TABLET | Freq: Two times a day (BID) | ORAL | 3 refills | Status: DC
  Filled 2021-07-30 – 2021-09-14 (×2): qty 120, 60d supply, fill #0

## 2021-07-31 ENCOUNTER — Ambulatory Visit (HOSPITAL_COMMUNITY)
Admission: EM | Admit: 2021-07-31 | Discharge: 2021-07-31 | Disposition: A | Discharge status: Home / Self Care | Payer: Medicaid Other | Attending: Emergency Medicine | Admitting: Emergency Medicine

## 2021-07-31 ENCOUNTER — Encounter (HOSPITAL_COMMUNITY): Payer: Self-pay

## 2021-07-31 DIAGNOSIS — I10 Essential (primary) hypertension: Secondary | ICD-10-CM | POA: Diagnosis not present

## 2021-07-31 MED ORDER — CLONIDINE HCL 0.1 MG PO TABS
ORAL_TABLET | ORAL | Status: AC
  Filled 2021-07-31: qty 2

## 2021-07-31 MED ORDER — CLONIDINE HCL 0.1 MG PO TABS
0.2000 mg | ORAL_TABLET | Freq: Once | ORAL | Status: AC
Start: 2021-07-31 — End: 2021-07-31
  Administered 2021-07-31: 0.2 mg via ORAL

## 2021-07-31 NOTE — ED Triage Notes (Signed)
Pt states his BP has been high at home all day. States he took his BP meds as prescribed except for his clonidine patch that he is out of. C/O ringing in his ears states that happens when hid BP is up.

## 2021-07-31 NOTE — ED Provider Notes (Signed)
[place]    CSN: [id number] Arrival date & time: 07/31/21  1518      History   Chief Complaint Chief Complaint  Patient presents with   Hypertension    HPI Benjamin [name] is a 61 y.o. male.   Patient presents for evaluation of his blood pressure, endorses it has been elevated at home all day with systolic blood pressure being greater than 160.  Endorses that he has been taking daily medication as prescribed however he is ran out of his clonidine patch.  Last application 7 days ago, due for changing.  Endorses that he has had a ringing in his bilateral ears which typically occurs when his blood pressure is elevated.  Also endorsing centralized chest tightness beginning today.  Denies shortness of breath, dizziness, lightheadedness, visual changes.  Followed by cardiology.  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   Protein-calorie malnutrition, severe 12/09/2020   Wound cellulitis 12/07/2020   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/2020   Ischemia of lower extremity 10/01/2020   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   Femoral-popliteal bypass graft occlusion, 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   PAD (peripheral artery disease) ([place]) 11/13/2019   Callus of foot 07/01/2019   Intermittent claudication ([place]) 01/20/2017   Need for influenza vaccination 01/20/2017   Needs smoking cessation education 09/06/2016   Abnormal echocardiogram 09/05/2016   Abnormal EKG 07/25/2016   Preoperative cardiovascular examination 07/25/2016   Shortness of breath on exertion 07/25/2016   Dyslipidemia, goal LDL below 70  07/25/2016   High blood pressure 07/25/2016   Abnormal chest x-ray 07/04/2016   Peripheral vascular disease ([place]) 07/04/2016   Decreased pedal pulses 06/24/2016   Varicose vein of leg 06/24/2016   Heavy smoker 06/24/2016   Abnormal weight loss 06/24/2016   Foot pain, left 06/24/2016   Screening for prostate cancer 06/24/2016    Past Surgical History:  Procedure Laterality Date   ABDOMINAL AORTOGRAM W/LOWER EXTREMITY N/A 07/12/2016   Procedure: Abdominal Aortogram w/Lower Extremity;  Surgeon: Serafina Mitchell, MD;  Location: [facility];  Service: Cardiovascular;  Laterality: N/A;  Bilateral extermity: Patent Renal As. No sig Dz in infrarenal Abd Aorta. Normal Bilat Iliac arteries. R SFA is 100% @ origin - recon in AK-Pop A. R PT A patent. L CFA occluded. L PFA recon @ origin, L SFA occluded w/ recon in AK Pop A.   ABDOMINAL AORTOGRAM W/LOWER EXTREMITY N/A 10/15/2019   Procedure: ABDOMINAL AORTOGRAM W/LOWER EXTREMITY;  Surgeon: Serafina Mitchell, MD;  Location: [facility];  Service: Cardiovascular;  Laterality: N/A;   AMPUTATION Left 10/04/2020   Procedure: LEFT ABOVE KNEE AMPUTATION;  Surgeon: Serafina Mitchell, MD;  Location: [facility];  Service: Vascular;  Laterality: Left;   AMPUTATION Left 12/08/2020   Procedure: RIGHT ABOVE KNEE AMPUTATION REVISION;  Surgeon: Angelia Mould, MD;  [facility];  Service: Vascular;  Laterality: Left;   AORTA - BILATERAL FEMORAL ARTERY BYPASS GRAFT N/A 05/08/2020   Procedure: AORTA BIFEMORAL BYPASS GRAFT;  Surgeon: Serafina Mitchell, MD;  Location: [facility];  Service:  Vascular;  Laterality: N/A;   COLONOSCOPY  06/2016   never   ENDARTERECTOMY FEMORAL Left 11/14/2019   Procedure: Left groin exploration, Redo left femoral artery exposure;  Surgeon: Serafina Mitchell, MD;  Location: [facility];  Service: Vascular;  Laterality: Left;   FEMORAL-FEMORAL BYPASS GRAFT N/A 11/13/2019   Procedure: BYPASS GRAFT FEMORAL-FEMORAL ARTERY RIGHT TO LEFT USING  HEMASHIELD GOLD GRAFT 70mm x 30cm;  Surgeon: Serafina Mitchell, MD;  Location: [facility];  Service: Vascular;  Laterality: N/A;   FEMORAL-POPLITEAL BYPASS GRAFT Left 11/13/2019   Procedure: LEFT FEMORAL BELOW KNEE-POPLITEAL ARTERY USING NON-REVERSED GREATER SAPHENOUS VEIN;  Surgeon: Serafina Mitchell, MD;  Location: [facility];  Service: Vascular;  Laterality: Left;   FEMORAL-POPLITEAL BYPASS GRAFT Left 05/08/2020   Procedure: LEFT REDO BYPASS GRAFT COMMON FEMORAL- BELOW KNEE POPLITEAL ARTERY USING PROPATEN GRAFT;  Surgeon: Serafina Mitchell, MD;  Location: [facility];  Service: Vascular;  Laterality: Left;   I & D EXTREMITY Left 09/07/2020   Procedure: IRRIGATION AND DEBRIDEMENT LEFT LEG WITH EXCISION OF LEFT LEG DISTAL BYPASS GRAFT;  Surgeon: Marty Heck, MD;  Location: [place];  Service: Vascular;  Laterality: Left;   LOWER EXTREMITY ANGIOGRAM Left 11/14/2019   Procedure: LEFT LOWER EXTREMITY ANGIOGRAM, BYPASS GRAFT ANGIOPLASTY;  Surgeon: Serafina Mitchell, MD;  [facility];  Service: Vascular;  Laterality: Left;   PERIPHERAL VASCULAR INTERVENTION  10/15/2019   Procedure: PERIPHERAL VASCULAR INTERVENTION;  Surgeon: Serafina Mitchell, MD;  Location: [facility];  Service: Cardiovascular;;  Rt Iliac   REMOVAL OF GRAFT Left 10/04/2020   Procedure: REMOVAL OF LEFT FEMORAL TO POPITEAL BYPASS GRAFT;  Surgeon: Serafina Mitchell, MD;  Location[facility];  Service: Vascular;  Laterality: Left;       Home Medications    Prior to Admission medications   Medication Sig Start Date End Date Taking? Authorizing Provider  acetaminophen (TYLENOL) 325 MG tablet Take 1-2 tablets (325-650 mg total) by mouth every 4 (four) hours as needed for mild pain. 12/25/20   Love, Ivan Anchors, PA-C  albuterol (VENTOLIN HFA) 108 (90 Base) MCG/ACT inhaler Inhale 1-2 puffs into the lungs every 6 (six) hours as needed for wheezing or shortness of breath. 06/12/21   Wynona Dove A, DO  amLODipine (NORVASC) 2.5 MG tablet Take 1 tablet  (2.5 mg total) by mouth daily. 07/28/21     atorvastatin (LIPITOR) 20 MG tablet Take 1 tablet (20 mg total) by mouth at bedtime. Patient taking differently: Take 20 mg by mouth daily. 06/28/21   [name], FNP  carvedilol (COREG) 6.25 MG tablet Take 1 tablet (6.25 mg total) by mouth 2 (two) times daily with a meal. 07/30/21   Monge, Helane Gunther, NP  cloNIDine (CATAPRES - DOSED IN MG/24 HR) 0.1 mg/24hr patch Place 1 patch (0.1 mg total) onto the skin once a week. 07/05/21   Raulkar, Clide Deutscher, MD  cloNIDine (CATAPRES - DOSED IN MG/24 HR) 0.1 mg/24hr patch Place 1 patch (0.1 mg total) onto the skin every 7 (seven) days. 07/28/21     clopidogrel (PLAVIX) 75 MG tablet Take 1 tablet (75 mg total) by mouth daily. 05/11/21     diphenhydrAMINE (BENADRYL) 25 MG tablet Take 25 mg by mouth every 6 (six) hours as needed for allergies.    [provider]  docusate sodium (COLACE) 100 MG capsule Take 1 capsule (100 mg total) by mouth daily. 03/16/21   Raulkar, Clide Deutscher, MD  famotidine (PEPCID) 20 MG tablet Take  1 tablet (20 mg total) by mouth daily. 07/22/21 08/21/21  Fatima Blank, MD  gabapentin (NEURONTIN) 600 MG tablet Take 1 tablet (600 mg total) by mouth 2 (two) times daily. Patient taking differently: Take 600 mg by mouth 2 (two) times daily as needed (leg pain). 03/22/21   Raulkar, Clide Deutscher, MD  guaiFENesin-dextromethorphan (ROBITUSSIN DM) 100-10 MG/5ML syrup Take 15 mLs by mouth every 4 (four) hours as needed for cough. 12/15/20   Terrilee Croak, MD  isosorbide mononitrate (IMDUR) 30 MG 24 hr tablet Take 1 tablet (30 mg total) by mouth once daily. 07/14/21     losartan (COZAAR) 50 MG tablet Take 1 tablet (50 mg total) by mouth in the morning and at bedtime. 07/05/21   Raulkar, Clide Deutscher, MD  methocarbamol (ROBAXIN) 500 MG tablet Take 1 tablet (500 mg total) by mouth every 6 (six) hours as needed for muscle spasms. 12/25/20   Love, Ivan Anchors, PA-C  Multiple Vitamin (MULTIVITAMIN WITH MINERALS) TABS tablet  Take 1 tablet by mouth daily. 12/16/20   Terrilee Croak, MD  nortriptyline (PAMELOR) 50 MG capsule Take 2 capsules (100 mg total) by mouth at bedtime. 06/30/21   Azucena Cecil, PA-C  senna (SENOKOT) 8.6 MG TABS tablet Take 1 tablet (8.6 mg total) by mouth 2 (two) times daily. 12/25/20   Love, Ivan Anchors, PA-C    Family History Family History  Problem Relation Age of Onset   Arthritis Mother    COPD Father    Stroke Father    Multiple sclerosis Sister     Social History Social History   Tobacco Use   Smoking status: Former    Packs/day: 1.50    Years: 29.00    Total pack years: 43.50    Types: Cigarettes    Quit date: 10/22/2020    Years since quitting: 0.7   Smokeless tobacco: Never   Tobacco comments:    Quit smoking September 2022.  Using Nicoderm patch currently.  Vaping Use   Vaping Use: Never used  Substance Use Topics   Alcohol use: No   Drug use: No     Allergies   Gabapentin, Tetanus toxoids, Aspirin, and Garlic   Review of Systems Review of Systems   Physical Exam Triage Vital Signs ED Triage Vitals  Enc Vitals Group     BP 07/31/21 1605 (!) 164/96     Pulse Rate 07/31/21 1605 80     Resp 07/31/21 1605 16     Temp 07/31/21 1605 98 F (36.7 C)     Temp Source 07/31/21 1605 Oral     SpO2 07/31/21 1605 91 %     Weight --      Height --      Head Circumference --      Peak Flow --      Pain Score 07/31/21 1606 0     Pain Loc --      Pain Edu? --      Excl. in [place]? --    No data found.  Updated Vital Signs BP (!) 167/84   Pulse 80   Temp 98 F (36.7 C) (Oral)   Resp 16   SpO2 91%   Visual Acuity Right Eye Distance:   Left Eye Distance:   Bilateral Distance:    Right Eye Near:   Left Eye Near:    Bilateral Near:     Physical Exam Constitutional:      Appearance: Normal appearance.  HENT:  Head: Normocephalic.  Cardiovascular:     Rate and Rhythm: Normal rate and regular rhythm.     Pulses: Normal pulses.     Heart sounds:  Normal heart sounds.  Pulmonary:     Effort: Pulmonary effort is normal.     Breath sounds: Normal breath sounds.  Neurological:     Mental Status: He is alert and oriented to person, place, and time. Mental status is at baseline.  Psychiatric:        Mood and Affect: Mood normal.        Behavior: Behavior normal.      UC Treatments / Results  Labs (all labs ordered are listed, but only abnormal results are displayed) Labs Reviewed - No data to display  EKG   Radiology No results found.  Procedures Procedures (including critical care time)  Medications Ordered in UC Medications  cloNIDine (CATAPRES) tablet 0.2 mg (0.2 mg Oral Given 07/31/21 1638)    Initial Impression / Assessment and Plan / UC Course  I have reviewed the triage vital signs and the nursing notes.  Pertinent labs & imaging results that were available during my care of the patient were reviewed by me and considered in my medical decision making (see chart for details).  Elevated blood pressure reading in office with diagnosis of hypertension  Blood pressure 167/84 in triage, well elevated nonemergent, patient is in no signs of distress, EKG showing normal sinus rhythm, no change in comparison to multiple EKGs completed within the last month, discussed with patient, as patient is due for clonidine patch given 0.2 mg of clonidine in office, discussed that medication will take effect in approximately 1 hour to help reduce blood pressure and current symptoms, as patient's pharmacy is currently not open, patient has refills for clonidine patch at pharmacy, discussed that he will need to go get medicine on Monday, given strict precautions for signs of hypertensive urgency to go to the nearest emergency department for management Final Clinical Impressions(s) / UC Diagnoses   Final diagnoses:  Elevated blood pressure reading in office with diagnosis of hypertension     Discharge Instructions      Your elevation  in blood pressure is most likely due to you needing to replace your clonidine patch, refills for your medication is available at the [facility], phone number and address are listed on your paperwork, please go get your medicine on Monday  You have been given a dose of clonidine here in the office to help lower your blood pressure today, it can take up to an hour before you start to see the effects of the pill  Your EKG did not show any changes to your heart rhythm or paced in comparison to previous EKGs completed within the last month  Continue to take all blood pressure medication as directed  Continue to monitor your blood pressure closely at home  If your blood pressure becomes elevated at 180/100 or greater and you have any of the following symptoms such as chest pain or tightness, dizziness, lightheadedness, blurred vision or shortness of breath please go to the nearest emergency department for further evaluation and management  You may reach out to your cardiologist for further questions or evaluation of your blood pressure   ED Prescriptions   None    PDMP not reviewed this encounter.   Hans Eden, NP 07/31/21 1644

## 2021-07-31 NOTE — Discharge Instructions (Signed)
Your elevation in blood pressure is most likely due to you needing to replace your clonidine patch, refills for your medication is available at the [facility], phone number and address are listed on your paperwork, please go get your medicine on Monday  You have been given a dose of clonidine here in the office to help lower your blood pressure today, it can take up to an hour before you start to see the effects of the pill  Your EKG did not show any changes to your heart rhythm or paced in comparison to previous EKGs completed within the last month  Continue to take all blood pressure medication as directed  Continue to monitor your blood pressure closely at home  If your blood pressure becomes elevated at 180/100 or greater and you have any of the following symptoms such as chest pain or tightness, dizziness, lightheadedness, blurred vision or shortness of breath please go to the nearest emergency department for further evaluation and management  You may reach out to your cardiologist for further questions or evaluation of your blood pressure

## 2021-08-02 ENCOUNTER — Encounter (HOSPITAL_COMMUNITY): Payer: Self-pay | Admitting: Emergency Medicine

## 2021-08-02 ENCOUNTER — Other Ambulatory Visit: Payer: Self-pay

## 2021-08-02 ENCOUNTER — Ambulatory Visit (HOSPITAL_COMMUNITY)
Admission: EM | Admit: 2021-08-02 | Discharge: 2021-08-02 | Disposition: A | Discharge status: Home / Self Care | Payer: Medicaid Other

## 2021-08-02 DIAGNOSIS — Z0130 Encounter for examination of blood pressure without abnormal findings: Secondary | ICD-10-CM

## 2021-08-02 NOTE — ED Notes (Signed)
Pt still not returned back in from moving lobby. Will re-attempt

## 2021-08-02 NOTE — ED Notes (Signed)
Pt not in lobby. Per registration patient went outside. Registration went to get pt from outside, upon return pt having to move car as parked in ambulance spot. Will re-attempt to call pt after moving his car.

## 2021-08-02 NOTE — Discharge Instructions (Addendum)
Continue use of the clonidine patches. You can change the patch weekly on Monday's. Next change will be one week from today - 6/19.  Please follow up with your primary care provider.

## 2021-08-02 NOTE — ED Triage Notes (Signed)
Pt reports that he got a box of Clonidine patches. Pt wanting to make sure that he has applied them correctly as he has never used them before.   Upon assessment, Pt had applied a patch cover to left upper arm and not the actual medication patch. This RN helped patient apply medication patch to left upper arm and then place cover patch over the medication one.

## 2021-08-02 NOTE — ED Provider Notes (Signed)
[facility]    CSN: [id number] Arrival date & time: 08/02/21  1006     History   Chief Complaint Medicine check   HPI Benjamin [name] is a 61 y.o. male.  Presents for evaluation of medicine patches.  He was seen 2 days ago at this urgent care and prescribed clonidine patches.  He wants to make sure he is applying them correctly.  Denies any symptoms. He feels much better after starting BP medications.  Denies fever/chills, headache, congestion, cough, sore throat, chest pain, shortness of breath, abdominal pain, vomiting/diarrhea, rash, weakness, numbness/tingling.    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   Femoral-popliteal bypass graft occlusion, 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   PAD (peripheral artery disease) (HCC) 11/13/2019   Callus of foot 07/01/2019   Intermittent claudication (HCC) 01/20/2017   Need for influenza vaccination 01/20/2017   Needs smoking cessation education 09/06/2016   Abnormal echocardiogram 09/05/2016   Abnormal EKG 07/25/2016   Preoperative cardiovascular examination 07/25/2016   Shortness of breath on exertion 07/25/2016   Dyslipidemia, goal LDL below 70 07/25/2016   High blood pressure 07/25/2016   Abnormal chest x-ray 07/04/2016   Peripheral vascular disease (HCC) 07/04/2016   Decreased pedal pulses 06/24/2016   Varicose vein of leg 06/24/2016   Heavy smoker  06/24/2016   Abnormal weight loss 06/24/2016   Foot pain, left 06/24/2016   Screening for prostate cancer 06/24/2016    Past Surgical History:  Procedure Laterality Date   ABDOMINAL AORTOGRAM W/LOWER EXTREMITY N/A 07/12/2016   Procedure: Abdominal Aortogram w/Lower Extremity;  Surgeon: Nada Libman, MD;  [facility];  Service: Cardiovascular;  Laterality: N/A;  Bilateral extermity: Patent Renal As. No sig Dz in infrarenal Abd Aorta. Normal Bilat Iliac arteries. R SFA is 100% @ origin - recon in AK-Pop A. R PT A patent. L CFA occluded. L PFA recon @ origin, L SFA occluded w/ recon in AK Pop A.   ABDOMINAL AORTOGRAM W/LOWER EXTREMITY N/A 10/15/2019   Procedure: ABDOMINAL AORTOGRAM W/LOWER EXTREMITY;  Surgeon: Nada Libman, MD;  [facility];  Service: Cardiovascular;  Laterality: N/A;   AMPUTATION Left 10/04/2020   Procedure: LEFT ABOVE KNEE AMPUTATION;  Surgeon: Nada Libman, MD;  Location: [facility] OR;  Service: Vascular;  Laterality: Left;   AMPUTATION Left 12/08/2020   Procedure: RIGHT ABOVE KNEE AMPUTATION REVISION;  Surgeon: Chuck Hint, MD;  Location: [facility] OR;  Service: Vascular;  Laterality: Left;   AORTA - BILATERAL FEMORAL ARTERY BYPASS GRAFT N/A 05/08/2020   Procedure: AORTA BIFEMORAL BYPASS GRAFT;  Surgeon: Nada Libman, MD;  [facility];  Service: Vascular;  Laterality: N/A;   COLONOSCOPY  06/2016   never   ENDARTERECTOMY FEMORAL Left 11/14/2019   Procedure: Left groin exploration, Redo left femoral artery exposure;  Surgeon: Nada Libman, MD;  [facility];  Service:  Vascular;  Laterality: Left;   FEMORAL-FEMORAL BYPASS GRAFT N/A 11/13/2019   Procedure: BYPASS GRAFT FEMORAL-FEMORAL ARTERY RIGHT TO LEFT USING HEMASHIELD GOLD GRAFT 8mm x 30cm;  Surgeon: Nada LibmanBrabham, Vance W, MD;  [facility];  Service: Vascular;  Laterality: N/A;   FEMORAL-POPLITEAL BYPASS GRAFT Left 11/13/2019   Procedure: LEFT FEMORAL BELOW  KNEE-POPLITEAL ARTERY USING NON-REVERSED GREATER SAPHENOUS VEIN;  Surgeon: Nada LibmanBrabham, Vance W, MD;  [facility];  Service: Vascular;  Laterality: Left;   FEMORAL-POPLITEAL BYPASS GRAFT Left 05/08/2020   Procedure: LEFT REDO BYPASS GRAFT COMMON FEMORAL- BELOW KNEE POPLITEAL ARTERY USING PROPATEN GRAFT;  Surgeon: Nada LibmanBrabham, Vance W, MD;  Location: [facility];  Service: Vascular;  Laterality: Left;   I & D EXTREMITY Left 09/07/2020   Procedure: IRRIGATION AND DEBRIDEMENT LEFT LEG WITH EXCISION OF LEFT LEG DISTAL BYPASS GRAFT;  Surgeon: Cephus Shellinglark, Christopher J, MD;  Location: [facility];  Service: Vascular;  Laterality: Left;   LOWER EXTREMITY ANGIOGRAM Left 11/14/2019   Procedure: LEFT LOWER EXTREMITY ANGIOGRAM, BYPASS GRAFT ANGIOPLASTY;  Surgeon: Nada LibmanBrabham, Vance W, MD;  [facility];  Service: Vascular;  Laterality: Left;   PERIPHERAL VASCULAR INTERVENTION  10/15/2019   Procedure: PERIPHERAL VASCULAR INTERVENTION;  Surgeon: Nada LibmanBrabham, Vance W, MD;  [facility];  Service: Cardiovascular;;  Rt Iliac   REMOVAL OF GRAFT Left 10/04/2020   Procedure: REMOVAL OF LEFT FEMORAL TO POPITEAL BYPASS GRAFT;  Surgeon: Nada LibmanBrabham, Vance W, MD;  [facility]: [facility];  Service: Vascular;  Laterality: Left;       Home Medications    Prior to Admission medications   Medication Sig Start Date End Date Taking? Authorizing Provider  acetaminophen (TYLENOL) 325 MG tablet Take 1-2 tablets (325-650 mg total) by mouth every 4 (four) hours as needed for mild pain. 12/25/20   Love, Evlyn KannerPamela S, PA-C  albuterol (VENTOLIN HFA) 108 (90 Base) MCG/ACT inhaler Inhale 1-2 puffs into the lungs every 6 (six) hours as needed for wheezing or shortness of breath. 06/12/21   Tanda RockersGray, Samuel A, DO  amLODipine (NORVASC) 2.5 MG tablet Take 1 tablet (2.5 mg total) by mouth daily. 07/28/21     atorvastatin (LIPITOR) 20 MG tablet Take 1 tablet (20 mg total) by mouth at bedtime. Patient taking differently: Take 20 mg by mouth daily. 06/28/21   Arnette FeltsMoore, Janece,  FNP  carvedilol (COREG) 6.25 MG tablet Take 1 tablet (6.25 mg total) by mouth 2 (two) times daily with a meal. 07/30/21   Monge, Petra KubaEmily C, NP  cloNIDine (CATAPRES - DOSED IN MG/24 HR) 0.1 mg/24hr patch Place 1 patch (0.1 mg total) onto the skin once a week. 07/05/21   Raulkar, Drema PryKrutika P, MD  cloNIDine (CATAPRES - DOSED IN MG/24 HR) 0.1 mg/24hr patch Place 1 patch (0.1 mg total) onto the skin once every 7 (seven) days. 07/28/21     clopidogrel (PLAVIX) 75 MG tablet Take 1 tablet (75 mg total) by mouth daily. 05/11/21     diphenhydrAMINE (BENADRYL) 25 MG tablet Take 25 mg by mouth every 6 (six) hours as needed for allergies.    [provider]  docusate sodium (COLACE) 100 MG capsule Take 1 capsule (100 mg total) by mouth daily. 03/16/21   Raulkar, Drema PryKrutika P, MD  famotidine (PEPCID) 20 MG tablet Take 1 tablet (20 mg total) by mouth daily. 07/22/21 08/21/21  Nira Connardama, Pedro Eduardo, MD  gabapentin (NEURONTIN) 600 MG tablet Take 1 tablet (600 mg total) by mouth 2 (two) times daily. Patient taking differently: Take 600 mg by  mouth 2 (two) times daily as needed (leg pain). 03/22/21   Raulkar, Drema Pry, MD  guaiFENesin-dextromethorphan (ROBITUSSIN DM) 100-10 MG/5ML syrup Take 15 mLs by mouth every 4 (four) hours as needed for cough. 12/15/20   Lorin Glass, MD  isosorbide mononitrate (IMDUR) 30 MG 24 hr tablet Take 1 tablet (30 mg total) by mouth once daily. 07/14/21     losartan (COZAAR) 50 MG tablet Take 1 tablet (50 mg total) by mouth in the morning and at bedtime. 07/05/21   Raulkar, Drema Pry, MD  methocarbamol (ROBAXIN) 500 MG tablet Take 1 tablet (500 mg total) by mouth every 6 (six) hours as needed for muscle spasms. 12/25/20   Love, Evlyn Kanner, PA-C  Multiple Vitamin (MULTIVITAMIN WITH MINERALS) TABS tablet Take 1 tablet by mouth daily. 12/16/20   Lorin Glass, MD  nortriptyline (PAMELOR) 50 MG capsule Take 2 capsules (100 mg total) by mouth at bedtime. 06/30/21   Al Decant, PA-C  senna  (SENOKOT) 8.6 MG TABS tablet Take 1 tablet (8.6 mg total) by mouth 2 (two) times daily. 12/25/20   Love, Evlyn Kanner, PA-C    Family History Family History  Problem Relation Age of Onset   Arthritis Mother    COPD Father    Stroke Father    Multiple sclerosis Sister     Social History Social History   Tobacco Use   Smoking status: Former    Packs/day: 1.50    Years: 29.00    Total pack years: 43.50    Types: Cigarettes    Quit date: 10/22/2020    Years since quitting: 0.7   Smokeless tobacco: Never   Tobacco comments:    Quit smoking September 2022.  Using Nicoderm patch currently.  Vaping Use   Vaping Use: Never used  Substance Use Topics   Alcohol use: No   Drug use: No     Allergies   Gabapentin, Tetanus toxoids, Aspirin, and Garlic   Review of Systems Review of Systems  Per HPI  Physical Exam Triage Vital Signs ED Triage Vitals [08/02/21 1200]  Enc Vitals Group     BP 136/83     Pulse Rate 86     Resp 18     Temp 98.1 F (36.7 C)     Temp Source Oral     SpO2 92 %     Weight      Height      Head Circumference      Peak Flow      Pain Score 0     Pain Loc      Pain Edu?      Excl. in GC?    No data found.  Updated Vital Signs BP 136/83 (BP Location: Left Arm)   Pulse 86   Temp 98.1 F (36.7 C) (Oral)   Resp 18   SpO2 92%    Physical Exam Vitals and nursing note reviewed.  Constitutional:      General: He is not in acute distress.    Appearance: He is well-developed.     Comments: Very pleasant and polite male  HENT:     Mouth/Throat:     Mouth: Mucous membranes are moist.     Pharynx: Oropharynx is clear.  Eyes:     Conjunctiva/sclera: Conjunctivae normal.  Cardiovascular:     Rate and Rhythm: Normal rate and regular rhythm.     Heart sounds: Normal heart sounds.  Pulmonary:     Effort: Pulmonary effort is normal.  No respiratory distress.     Breath sounds: Normal breath sounds.  Musculoskeletal:     Cervical back: Normal  range of motion.  Lymphadenopathy:     Cervical: No cervical adenopathy.  Neurological:     Mental Status: He is alert and oriented to person, place, and time.     UC Treatments / Results  Labs (all labs ordered are listed, but only abnormal results are displayed) Labs Reviewed - No data to display  EKG  Radiology No results found.  Procedures Procedures  Medications Ordered in UC Medications - No data to display  Initial Impression / Assessment and Plan / UC Course  I have reviewed the triage vital signs and the nursing notes.  Pertinent labs & imaging results that were available during my care of the patient were reviewed by me and considered in my medical decision making (see chart for details).  In triage, nurse showed patient how to apply patch correctly.  Patient verbalizes understanding.  He understands he can leave these on for a week and change them out weekly.  Since today was his first day with the patch, we discussed he can change the patch on Mondays.  Patient blood pressure is wonderful in clinic today.  He has no symptoms.  He does have a follow-up appointment with his cardiologist at the end of this month. Return precautions discussed. Patient agrees to plan and is discharged in stable condition.  Final Clinical Impressions(s) / UC Diagnoses   Final diagnoses:  Blood pressure check     Discharge Instructions      Continue use of the clonidine patches. You can change the patch weekly on Monday's. Next change will be one week from today - 6/19.  Please follow up with your primary care provider.     ED Prescriptions   None    PDMP not reviewed this encounter.   Rogina Schiano, Ray Church 08/02/21 1239

## 2021-08-06 ENCOUNTER — Ambulatory Visit (HOSPITAL_COMMUNITY)
Admission: EM | Admit: 2021-08-06 | Discharge: 2021-08-06 | Disposition: A | Discharge status: Home / Self Care | Payer: Medicaid Other

## 2021-08-06 ENCOUNTER — Other Ambulatory Visit: Payer: Self-pay

## 2021-08-06 ENCOUNTER — Emergency Department (HOSPITAL_COMMUNITY)
Admission: EM | Admit: 2021-08-06 | Discharge: 2021-08-07 | Disposition: A | Discharge status: Home / Self Care | Payer: Medicaid Other | Attending: Emergency Medicine | Admitting: Emergency Medicine

## 2021-08-06 ENCOUNTER — Encounter (HOSPITAL_COMMUNITY): Payer: Self-pay | Admitting: Emergency Medicine

## 2021-08-06 ENCOUNTER — Encounter (HOSPITAL_COMMUNITY): Payer: Self-pay

## 2021-08-06 DIAGNOSIS — Z0130 Encounter for examination of blood pressure without abnormal findings: Secondary | ICD-10-CM | POA: Diagnosis not present

## 2021-08-06 DIAGNOSIS — Z87891 Personal history of nicotine dependence: Secondary | ICD-10-CM | POA: Diagnosis not present

## 2021-08-06 DIAGNOSIS — I10 Essential (primary) hypertension: Secondary | ICD-10-CM | POA: Insufficient documentation

## 2021-08-06 DIAGNOSIS — H9312 Tinnitus, left ear: Secondary | ICD-10-CM

## 2021-08-06 DIAGNOSIS — D649 Anemia, unspecified: Secondary | ICD-10-CM | POA: Insufficient documentation

## 2021-08-06 LAB — CBC WITH DIFFERENTIAL/PLATELET
Abs Immature Granulocytes: 0.02 10*3/uL (ref 0.00–0.07)
Basophils Absolute: 0 10*3/uL (ref 0.0–0.1)
Basophils Relative: 0 %
Eosinophils Absolute: 0.2 10*3/uL (ref 0.0–0.5)
Eosinophils Relative: 3 %
HCT: 37 % — ABNORMAL LOW (ref 39.0–52.0)
Hemoglobin: 12.2 g/dL — ABNORMAL LOW (ref 13.0–17.0)
Immature Granulocytes: 0 %
Lymphocytes Relative: 27 %
Lymphs Abs: 1.5 10*3/uL (ref 0.7–4.0)
MCH: 23.3 pg — ABNORMAL LOW (ref 26.0–34.0)
MCHC: 33 g/dL (ref 30.0–36.0)
MCV: 70.6 fL — ABNORMAL LOW (ref 80.0–100.0)
Monocytes Absolute: 0.5 10*3/uL (ref 0.1–1.0)
Monocytes Relative: 8 %
Neutro Abs: 3.5 10*3/uL (ref 1.7–7.7)
Neutrophils Relative %: 62 %
Platelets: 332 10*3/uL (ref 150–400)
RBC: 5.24 MIL/uL (ref 4.22–5.81)
RDW: 19.2 % — ABNORMAL HIGH (ref 11.5–15.5)
WBC: 5.7 10*3/uL (ref 4.0–10.5)
nRBC: 0 % (ref 0.0–0.2)

## 2021-08-06 LAB — BASIC METABOLIC PANEL
Anion gap: 6 (ref 5–15)
BUN: 9 mg/dL (ref 8–23)
CO2: 24 mmol/L (ref 22–32)
Calcium: 8.9 mg/dL (ref 8.9–10.3)
Chloride: 110 mmol/L (ref 98–111)
Creatinine, Ser: 1 mg/dL (ref 0.61–1.24)
GFR, Estimated: >60 mL/min (ref >60)
Glucose, Bld: 126 mg/dL — ABNORMAL HIGH (ref 70–99)
Potassium: 3.5 mmol/L (ref 3.5–5.1)
Sodium: 140 mmol/L (ref 135–145)

## 2021-08-06 NOTE — ED Provider Notes (Signed)
[facility]    CSN: [id number] Arrival date & time: 08/06/21  1220     History   Chief Complaint Chief Complaint  Patient presents with   Blood Pressure Check   Tinnitus    HPI QUANTRELL SPLITT is a 61 y.o. male.  Presents for blood pressure check.  Was seen on 6/12 by this provider for same.  He has recently begun clonidine patches and he is worried that his blood pressure is not controlled.  Last visit and this visit BP has been well within acceptable range.  Patient denies any headache, vision changes, blurry vision, chest pain, shortness of breath, abdominal pain.  He is asymptomatic today.  He does state ringing in his left ear that is persistent when he lays flat at nighttime.  Symptoms for 1 week. No dizziness or vertigo.  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   Femoral-popliteal bypass graft occlusion, 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   PAD (peripheral artery disease) (HCC) 11/13/2019   Callus of foot 07/01/2019   Intermittent claudication (HCC) 01/20/2017   Need for influenza vaccination 01/20/2017   Needs smoking cessation education 09/06/2016   Abnormal echocardiogram 09/05/2016   Abnormal EKG 07/25/2016   Preoperative cardiovascular examination 07/25/2016   Shortness of breath on exertion 07/25/2016   Dyslipidemia, goal LDL below 70 07/25/2016   High blood pressure 07/25/2016    Abnormal chest x-ray 07/04/2016   Peripheral vascular disease (HCC) 07/04/2016   Decreased pedal pulses 06/24/2016   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 No sig Dz in infrarenal Abd Aorta. Normal Bilat Iliac arteries. R SFA is 100% @ origin - recon in AK-Pop A. R PT A patent. L CFA occluded. L PFA recon @ origin, L SFA occluded w/ recon in AK Pop A.   ABDOMINAL AORTOGRAM W/LOWER EXTREMITY N/A 10/15/2019   Procedure: ABDOMINAL AORTOGRAM W/LOWER EXTREMITY;  Surgeon: Nada Libman, MD;  [facility];  Service: Cardiovascular;  Laterality: N/A;   AMPUTATION Left 10/04/2020   Procedure: LEFT ABOVE KNEE AMPUTATION;  Surgeon: Nada Libman, MD;  Location: [facility] OR;  Service: Vascular;  Laterality: Left;   AMPUTATION Left 12/08/2020   Procedure: RIGHT ABOVE KNEE AMPUTATION REVISION;  Surgeon: Chuck Hint, MD;  Location: [facility] OR;  Service: Vascular;  Laterality: Left;   AORTA - BILATERAL FEMORAL ARTERY BYPASS GRAFT N/A 05/08/2020   Procedure: AORTA BIFEMORAL BYPASS GRAFT;  Surgeon: Nada Libman, MD;  [facility];  Service: Vascular;  Laterality: N/A;   COLONOSCOPY  06/2016   never   ENDARTERECTOMY FEMORAL Left 11/14/2019   Procedure: Left groin exploration, Redo left femoral artery exposure;  Surgeon: Nada Libman, MD;  [facility];  Service: Vascular;  Laterality: Left;   FEMORAL-FEMORAL BYPASS GRAFT N/A 11/13/2019   Procedure: BYPASS GRAFT FEMORAL-FEMORAL ARTERY RIGHT TO LEFT USING HEMASHIELD GOLD GRAFT 76mm x 30cm;  Surgeon:  Nada Libman, MD;  Location: [place] OR;  Service: Vascular;  Laterality: N/A;   FEMORAL-POPLITEAL BYPASS GRAFT Left 11/13/2019   Procedure: LEFT FEMORAL BELOW KNEE-POPLITEAL ARTERY USING NON-REVERSED GREATER SAPHENOUS VEIN;  Surgeon: Nada Libman, MD;  [facility];  Service: Vascular;  Laterality: Left;   FEMORAL-POPLITEAL BYPASS GRAFT Left 05/08/2020   Procedure: LEFT REDO BYPASS GRAFT COMMON FEMORAL- BELOW KNEE POPLITEAL ARTERY USING PROPATEN GRAFT;  Surgeon: Nada Libman, MD;  [facility];  Service: Vascular;  Laterality: Left;   I & D EXTREMITY Left 09/07/2020   Procedure: IRRIGATION AND DEBRIDEMENT LEFT LEG WITH EXCISION OF LEFT LEG DISTAL BYPASS GRAFT;  Surgeon: Cephus Shelling, MD;  Location[facility];  Service: Vascular;  Laterality: Left;   LOWER EXTREMITY ANGIOGRAM Left 11/14/2019   Procedure: LEFT LOWER EXTREMITY ANGIOGRAM, BYPASS GRAFT ANGIOPLASTY;  Surgeon: Nada Libman, MD;  [facility];  Service: Vascular;  Laterality: Left;   PERIPHERAL VASCULAR INTERVENTION  10/15/2019   Procedure: PERIPHERAL VASCULAR INTERVENTION;  Surgeon: Nada Libman, MD;  [facility];  Service: Cardiovascular;;  Rt Iliac   REMOVAL OF GRAFT Left 10/04/2020   Procedure: REMOVAL OF LEFT FEMORAL TO POPITEAL BYPASS GRAFT;  Surgeon: Nada Libman, MD;  Location[facility];  Service: Vascular;  Laterality: Left;     Home Medications    Prior to Admission medications   Medication Sig Start Date End Date Taking? Authorizing Provider  amLODipine (NORVASC) 2.5 MG tablet Take 1 tablet (2.5 mg total) by mouth daily. 07/28/21  Yes   atorvastatin (LIPITOR) 20 MG tablet Take 1 tablet (20 mg total) by mouth at bedtime. Patient taking differently: Take 20 mg by mouth daily. 06/28/21  Yes Arnette Felts, FNP  carvedilol (COREG) 6.25 MG tablet Take 1 tablet (6.25 mg total) by mouth 2 (two) times daily with a meal. 07/30/21  Yes Monge, Petra Kuba, NP  cloNIDine (CATAPRES - DOSED IN MG/24  HR) 0.1 mg/24hr patch Place 1 patch (0.1 mg total) onto the skin once every 7 (seven) days. 07/28/21  Yes   isosorbide mononitrate (IMDUR) 30 MG 24 hr tablet Take 1 tablet (30 mg total) by mouth once daily. 07/14/21  Yes   losartan (COZAAR) 50 MG tablet Take 1 tablet (50 mg total) by mouth in the morning and at bedtime. 07/05/21  Yes Raulkar, Drema Pry, MD  acetaminophen (TYLENOL) 325 MG tablet Take 1-2 tablets (325-650 mg total) by mouth every 4 (four) hours as needed for mild pain. 12/25/20   Love, Evlyn Kanner, PA-C  albuterol (VENTOLIN HFA) 108 (90 Base) MCG/ACT inhaler Inhale 1-2 puffs into the lungs every 6 (six) hours as needed for wheezing or shortness of breath. 06/12/21   Tanda Rockers A, DO  cloNIDine (CATAPRES - DOSED IN MG/24 HR) 0.1 mg/24hr patch Place 1 patch (0.1 mg total) onto the skin once a week. 07/05/21   Raulkar, Drema Pry, MD  clopidogrel (PLAVIX) 75 MG tablet Take 1 tablet (75 mg total) by mouth daily. 05/11/21     diphenhydrAMINE (BENADRYL) 25 MG  tablet Take 25 mg by mouth every 6 (six) hours as needed for allergies.    [provider]  docusate sodium (COLACE) 100 MG capsule Take 1 capsule (100 mg total) by mouth daily. 03/16/21   Raulkar, Drema Pry, MD  famotidine (PEPCID) 20 MG tablet Take 1 tablet (20 mg total) by mouth daily. 07/22/21 08/21/21  Nira Conn, MD  gabapentin (NEURONTIN) 600 MG tablet Take 1 tablet (600 mg total) by mouth 2 (two) times daily. Patient taking differently: Take 600 mg by mouth 2 (two) times daily as needed (leg pain). 03/22/21   Raulkar, Drema Pry, MD  guaiFENesin-dextromethorphan (ROBITUSSIN DM) 100-10 MG/5ML syrup Take 15 mLs by mouth every 4 (four) hours as needed for cough. 12/15/20   Lorin Glass, MD  methocarbamol (ROBAXIN) 500 MG tablet Take 1 tablet (500 mg total) by mouth every 6 (six) hours as needed for muscle spasms. 12/25/20   Love, Evlyn Kanner, PA-C  Multiple Vitamin (MULTIVITAMIN WITH MINERALS) TABS tablet Take 1 tablet by mouth  daily. 12/16/20   Lorin Glass, MD  nortriptyline (PAMELOR) 50 MG capsule Take 2 capsules (100 mg total) by mouth at bedtime. 06/30/21   Al Decant, PA-C  senna (SENOKOT) 8.6 MG TABS tablet Take 1 tablet (8.6 mg total) by mouth 2 (two) times daily. 12/25/20   Love, Evlyn Kanner, PA-C    Family History Family History  Problem Relation Age of Onset   Arthritis Mother    COPD Father    Stroke Father    Multiple sclerosis Sister     Social History Social History   Tobacco Use   Smoking status: Former    Packs/day: 1.50    Years: 29.00    Total pack years: 43.50    Types: Cigarettes    Quit date: 10/22/2020    Years since quitting: 0.7   Smokeless tobacco: Never   Tobacco comments:    Quit smoking September 2022.  Using Nicoderm patch currently.  Vaping Use   Vaping Use: Never used  Substance Use Topics   Alcohol use: No   Drug use: No     Allergies   Gabapentin, Tetanus toxoids, Aspirin, and Garlic   Review of Systems Review of Systems Per HPI  Physical Exam Triage Vital Signs ED Triage Vitals  Enc Vitals Group     BP 08/06/21 1247 133/81     Pulse Rate 08/06/21 1247 74     Resp 08/06/21 1247 16     Temp 08/06/21 1247 98 F (36.7 C)     Temp Source 08/06/21 1247 Oral     SpO2 08/06/21 1247 93 %     Weight --      Height --      Head Circumference --      Peak Flow --      Pain Score 08/06/21 1249 7     Pain Loc --      Pain Edu? --      Excl. in GC? --    No data found.  Updated Vital Signs BP 133/81 (BP Location: Right Arm)   Pulse 74   Temp 98 F (36.7 C) (Oral)   Resp 16   SpO2 93%    Physical Exam Vitals and nursing note reviewed.  Constitutional:      General: He is not in acute distress.    Appearance: Normal appearance.  HENT:     Right Ear: Tympanic membrane and ear canal normal.     Left Ear:  Tympanic membrane, ear canal and external ear normal. There is no impacted cerumen.     Nose: Nose normal.     Mouth/Throat:      Pharynx: Oropharynx is clear.  Eyes:     Conjunctiva/sclera: Conjunctivae normal.     Pupils: Pupils are equal, round, and reactive to light.  Cardiovascular:     Rate and Rhythm: Normal rate and regular rhythm.     Pulses: Normal pulses.     Heart sounds: Normal heart sounds.  Pulmonary:     Effort: Pulmonary effort is normal.     Breath sounds: Normal breath sounds.  Abdominal:     Tenderness: There is no abdominal tenderness.  Musculoskeletal:        General: Normal range of motion.     Cervical back: Normal range of motion.     Comments: L leg AKA  Lymphadenopathy:     Cervical: No cervical adenopathy.  Skin:    General: Skin is warm and dry.  Neurological:     General: No focal deficit present.     Mental Status: He is alert and oriented to person, place, and time.  Psychiatric:        Mood and Affect: Mood normal.        Behavior: Behavior normal.     UC Treatments / Results  Labs (all labs ordered are listed, but only abnormal results are displayed) Labs Reviewed - No data to display  EKG  Radiology No results found.  Procedures Procedures (including critical care time)  Medications Ordered in UC Medications - No data to display  Initial Impression / Assessment and Plan / UC Course  I have reviewed the triage vital signs and the nursing notes.  Pertinent labs & imaging results that were available during my care of the patient were reviewed by me and considered in my medical decision making (see chart for details).  Physical exam unremarkable.  Blood pressure 133/81.  Patient is asymptomatic and just wanted to have his pressure checked.  He does have a primary care provider who he follows with. Unknown cause of ringing in the ear.  I had originally given him contact information for the ear nose and throat specialist.  On chart review I see he has an upcoming appointment with audiology and otolaryngology for next week.  I discussed with patient he should  attend these appointments and they will be able to help him with his symptoms. We discussed symptoms that would warrant immediate evaluation at the emergency department.  Return precautions discussed. Patient agrees to plan and is discharged in stable condition.  Final Clinical Impressions(s) / UC Diagnoses   Final diagnoses:  Blood pressure check  Tinnitus of left ear     Discharge Instructions      Please follow up with the ear, nose, and throat specialists if the ringing in your ear persists. Their number is highlighted below.  I recommend to follow-up with your primary care provider if you are worried about your blood pressure.  It is very good in clinic today. Continue to take all your medications as prescribed.   If you notice any worsening symptoms or you start to feel unwell, please go to the emergency department.    ED Prescriptions   None    PDMP not reviewed this encounter.   Darrek Leasure, Lurena Joiner, PA-C 08/06/21 1425

## 2021-08-06 NOTE — ED Triage Notes (Signed)
Sts he has been hypertensive (150/111) pta. Has been taking carvedilol.   Brief period of chest discomfort and headache. Currently denies pain.

## 2021-08-06 NOTE — ED Triage Notes (Signed)
Patient presents for BP check.  Patient endorses being seen at this clinic on 6/12 and given carvedilol for blood pressure per patient statement.   Patient is unable to check BP at home.   Patient c/o LFT ear ringing that's been ongoing for 1 week.   Patient endorses pain at times.   Patient has used ringing relief OTC medication with some relief of symptoms previously.

## 2021-08-06 NOTE — ED Provider Triage Note (Cosign Needed Addendum)
Emergency Medicine Provider Triage Evaluation Note  Benjamin [name] , a 61 y.o. male  was evaluated in triage.  Pt complains of elevated blood pressure onset today.  Patient notes he has been taking carvedilol has been today.  No meds tried prior to arrival.  Denies chest pain, shortness of breath, nausea, vomiting, headache.  Has tinnitus, patient notes that he has a follow-up appointment with the ENT and audiology on 08/12/2021. Review of Systems  Positive: As per HPI above Negative:   Physical Exam  BP (!) 120/91   Pulse 83   Temp 98.1 F (36.7 C) (Oral)   Resp 16   Ht 5\' 8"  (1.727 m)   Wt 72.6 kg   SpO2 95%   BMI 24.33 kg/m  Gen:   Awake, no distress   Resp:  Normal effort  MSK:   Moves extremities without difficulty  Other:    Medical Decision Making  Medically screening exam initiated at 9:13 PM.  Appropriate orders placed.  was informed that the remainder of the evaluation will be completed by another provider, this initial triage assessment does not replace that evaluation, and the importance of remaining in the ED until their evaluation is complete.  Work-up initiated   Radha Coggins A, PA-C 08/06/21 2146    Phillipa Morden A, PA-C 08/06/21 2147

## 2021-08-06 NOTE — Discharge Instructions (Addendum)
Please follow up with the ear, nose, and throat specialists if the ringing in your ear persists. Their number is highlighted below.  I recommend to follow-up with your primary care provider if you are worried about your blood pressure.  It is very good in clinic today. Continue to take all your medications as prescribed.   If you notice any worsening symptoms or you start to feel unwell, please go to the emergency department.

## 2021-08-07 LAB — TROPONIN I (HIGH SENSITIVITY): Troponin I (High Sensitivity): 5 ng/L (ref <18)

## 2021-08-07 NOTE — Discharge Instructions (Signed)

## 2021-08-07 NOTE — ED Provider Notes (Signed)
Emergency Department Provider Note   I have reviewed the triage vital signs and the nursing notes.   HISTORY  Chief Complaint Hypertension   HPI Benjamin [name] is a 61 y.o. male presents to the ED with elevated BP. Notes many visits in the recent past for this and is compliant with medication. Earlier, notes a "prickly" central chest discomfort which resolved. No SOB. No HA. No weakness/numbness. No abd/back pain.    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   Bilateral femoral artery disease    Review of Systems  Constitutional: No fever/chills Eyes: No visual changes. Cardiovascular: Positive chest pain and elevated BP.  Respiratory: Denies shortness of breath. Gastrointestinal: No abdominal pain.  No nausea, no vomiting.  No diarrhea.  No constipation. Genitourinary: Negative for dysuria. Musculoskeletal: Negative for back pain. Skin: Negative for rash. Neurological: Negative for headaches, focal weakness or numbness.   ____________________________________________   PHYSICAL EXAM:  VITAL SIGNS: ED Triage Vitals  Enc Vitals Group     BP 08/06/21 2050 (!) 120/91     Pulse Rate 08/06/21 2050 83     Resp 08/06/21 2050 16     Temp 08/06/21 2050 98.1 F (36.7 C)     Temp Source 08/06/21 2050 Oral     SpO2 08/06/21 2050 95 %     Weight 08/06/21 2054 160 lb (72.6 kg)     Height 08/06/21 2054 5\' 8"  (1.727 m)   Constitutional: Alert and oriented. Well appearing and in no acute distress. Eyes: Conjunctivae are normal.  Head: Atraumatic. Nose: No congestion/rhinnorhea. Mouth/Throat: Mucous membranes are moist.  Neck: No stridor.   Cardiovascular: Normal rate, regular rhythm. Good peripheral circulation. Grossly normal heart sounds.   Respiratory: Normal respiratory effort.  No retractions. Lungs CTAB. Gastrointestinal: Soft and  nontender. No distention.  Musculoskeletal: No lower extremity tenderness nor edema. No gross deformities of extremities. Neurologic:  Normal speech and language. No gross focal neurologic deficits are appreciated.  Skin:  Skin is warm, dry and intact. No rash noted.  ____________________________________________   LABS (all labs ordered are listed, but only abnormal results are displayed)  Labs Reviewed  BASIC METABOLIC PANEL - Abnormal; Notable for the following components:      Result Value   Glucose, Bld 126 (*)    All other components within normal limits  CBC WITH DIFFERENTIAL/PLATELET - Abnormal; Notable for the following components:   Hemoglobin 12.2 (*)    HCT 37.0 (*)    MCV 70.6 (*)    MCH 23.3 (*)    RDW 19.2 (*)    All other components within normal limits  TROPONIN I (HIGH SENSITIVITY)  TROPONIN I (HIGH SENSITIVITY)   ____________________________________________  EKG  Interpreted by me. Tracing shows NSR. Narrow QRS. Nonspecific ST changes and T waves inversions. Unchanged from prior tracings.    ____________________________________________   PROCEDURES  Procedure(s) performed:   Procedures  None ____________________________________________   INITIAL IMPRESSION / ASSESSMENT AND PLAN / ED COURSE  Pertinent labs & imaging results that were available during my care of the patient were reviewed by me and considered in my medical decision making (see chart for details).   This patient is Presenting for Evaluation of HTN/CP, which does require a range of treatment options, and is a complaint that involves a high risk of morbidity  and mortality.  The Differential Diagnoses includes but is not exclusive to acute coronary syndrome, aortic dissection, pulmonary embolism, cardiac tamponade, community-acquired pneumonia, pericarditis, musculoskeletal chest wall pain, etc.   I decided to review pertinent External Data, and in summary patient with multiple UC  visits this month for BP checks.   Clinical Laboratory Tests Ordered, included Troponin WNL. No AKI. Mild anemia at 12.2.   Cardiac Monitor Tracing which shows NSR.   Social Determinants of Health Risk patient is a former smoker.   Medical Decision Making: Summary:  Patient presents to the ED with elevated BP and very atypical CP earlier today. CP work up is reassuring. BP mildly elevated here without lab or exam evidence of HTN emergency.   Reevaluation with update and discussion with patient. Discussed med compliance with BP meds and stressed regular PCP follow up for BP reassessment and med mgmt. No med changes planned from the ED.   Disposition: discharge  ____________________________________________  FINAL CLINICAL IMPRESSION(S) / ED DIAGNOSES  Final diagnoses:  Primary hypertension    Note:  This document was prepared using Dragon voice recognition software and may include unintentional dictation errors.  Alona Bene, MD, [facility] Emergency Medicine    Clariece Roesler, Arlyss Repress, MD 08/07/21 1534

## 2021-08-10 ENCOUNTER — Encounter (HOSPITAL_COMMUNITY): Payer: Self-pay

## 2021-08-10 ENCOUNTER — Other Ambulatory Visit: Payer: Self-pay

## 2021-08-10 ENCOUNTER — Ambulatory Visit: Payer: Medicaid Other | Admitting: Physical Medicine and Rehabilitation

## 2021-08-10 ENCOUNTER — Emergency Department (HOSPITAL_COMMUNITY): Payer: Medicaid Other

## 2021-08-10 ENCOUNTER — Emergency Department (HOSPITAL_COMMUNITY)
Admission: EM | Admit: 2021-08-10 | Discharge: 2021-08-10 | Disposition: A | Discharge status: Home / Self Care | Payer: Medicaid Other | Attending: Emergency Medicine | Admitting: Emergency Medicine

## 2021-08-10 ENCOUNTER — Ambulatory Visit (HOSPITAL_COMMUNITY)
Admission: EM | Admit: 2021-08-10 | Discharge: 2021-08-10 | Disposition: A | Discharge status: Home / Self Care | Payer: Medicaid Other

## 2021-08-10 ENCOUNTER — Encounter (HOSPITAL_COMMUNITY): Payer: Self-pay | Admitting: Emergency Medicine

## 2021-08-10 DIAGNOSIS — Z79899 Other long term (current) drug therapy: Secondary | ICD-10-CM | POA: Diagnosis not present

## 2021-08-10 DIAGNOSIS — I10 Essential (primary) hypertension: Secondary | ICD-10-CM | POA: Insufficient documentation

## 2021-08-10 DIAGNOSIS — R0789 Other chest pain: Secondary | ICD-10-CM | POA: Insufficient documentation

## 2021-08-10 DIAGNOSIS — H9312 Tinnitus, left ear: Secondary | ICD-10-CM | POA: Diagnosis not present

## 2021-08-10 DIAGNOSIS — F17200 Nicotine dependence, unspecified, uncomplicated: Secondary | ICD-10-CM | POA: Diagnosis not present

## 2021-08-10 DIAGNOSIS — Z7902 Long term (current) use of antithrombotics/antiplatelets: Secondary | ICD-10-CM | POA: Insufficient documentation

## 2021-08-10 DIAGNOSIS — R079 Chest pain, unspecified: Secondary | ICD-10-CM

## 2021-08-10 HISTORY — DX: Type 2 diabetes mellitus without complications: E11.9

## 2021-08-10 LAB — CBC
HCT: 35 % — ABNORMAL LOW (ref 39.0–52.0)
Hemoglobin: 11.5 g/dL — ABNORMAL LOW (ref 13.0–17.0)
MCH: 22.7 pg — ABNORMAL LOW (ref 26.0–34.0)
MCHC: 32.9 g/dL (ref 30.0–36.0)
MCV: 69.2 fL — ABNORMAL LOW (ref 80.0–100.0)
Platelets: 312 10*3/uL (ref 150–400)
RBC: 5.06 MIL/uL (ref 4.22–5.81)
RDW: 19 % — ABNORMAL HIGH (ref 11.5–15.5)
WBC: 5.1 10*3/uL (ref 4.0–10.5)
nRBC: 0 % (ref 0.0–0.2)

## 2021-08-10 LAB — BASIC METABOLIC PANEL
Anion gap: 10 (ref 5–15)
BUN: 11 mg/dL (ref 8–23)
CO2: 21 mmol/L — ABNORMAL LOW (ref 22–32)
Calcium: 8.8 mg/dL — ABNORMAL LOW (ref 8.9–10.3)
Chloride: 109 mmol/L (ref 98–111)
Creatinine, Ser: 0.88 mg/dL (ref 0.61–1.24)
GFR, Estimated: >60 mL/min (ref >60)
Glucose, Bld: 133 mg/dL — ABNORMAL HIGH (ref 70–99)
Potassium: 3.5 mmol/L (ref 3.5–5.1)
Sodium: 140 mmol/L (ref 135–145)

## 2021-08-10 LAB — TROPONIN I (HIGH SENSITIVITY)
Troponin I (High Sensitivity): 5 ng/L (ref <18)
Troponin I (High Sensitivity): 5 ng/L (ref <18)

## 2021-08-10 NOTE — ED Provider Triage Note (Signed)
  Emergency Medicine Provider Triage Evaluation Note  MRN:  [id number]  Arrival date & time: 08/10/21    Medically screening exam initiated at 5:59 AM.   CC:   Chest Tightness / Hypertension    HPI:  Benjamin [name] is a 61 y.o. year-old male presents to the ED with chief complaint of chest tightness that he attributes to high BP.  He states that his chest still feels tight now.  See frequently for similar.  History provided by History provided by patient. ROS:  -As included in HPI PE:   Vitals:   08/10/21 0554  BP: (!) 135/95  Pulse: 80  Resp: 18  Temp: 98.5 F (36.9 C)  SpO2: 94%    Non-toxic appearing No respiratory distress  MDM:   I've ordered labs in triage to expedite lab/diagnostic workup.  Patient was informed that the remainder of the evaluation will be completed by another provider, this initial triage assessment does not replace that evaluation, and the importance of remaining in the ED until their evaluation is complete.    Roxy Horseman, PA-C 08/10/21 0600

## 2021-08-10 NOTE — ED Triage Notes (Signed)
Patient reports central chest tightness and hypertension this morning , No SOB /denies diaphoresis .

## 2021-08-10 NOTE — ED Provider Notes (Signed)
[facility]    CSN: [id number] Arrival date & time: 08/10/21  1654      History   Chief Complaint Chief Complaint  Patient presents with   Hypertension   Blurred Vision   Tinnitus   Chest Pain    HPI Benjamin [name] is a 61 y.o. male.    Hypertension Associated symptoms include chest pain.  Chest Pain   Patient is a 61 year old African-American male who presents to the urgent care for complaints of elevated blood pressure, chest tightness, vision changes, ringing in the left ear for the last 2 days.  Of note, patient was seen in the emergency department earlier today for the same symptoms.  I personally reviewed patient's ER note from earlier today as well as labs, chest x-ray, EKG.  No acute findings and he was discharged in stable condition.  Patient states that he is still having the same symptoms as earlier, no changes to note.  He does have baseline tinnitus and blurry vision when his blood pressure gets elevated.  He does follow-up with a primary care physician.  He states that he also has a cardiologist.  Last seen in October 2022.  Patient took his blood pressure medication as directed today and does not need any refills.  No other symptoms to report at this time.  States that he drove himself here without any difficulty.  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   Protein-calorie malnutrition, severe 12/09/2020   Wound cellulitis 12/07/2020   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/2020   Ischemia of lower extremity 10/01/2020   Aortic  occlusion (HCC) 05/08/2020   Femoral-popliteal bypass graft occlusion, 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   PAD (peripheral artery disease) (HCC) 11/13/2019   Callus of foot 07/01/2019   Intermittent claudication (HCC) 01/20/2017   Need for influenza vaccination 01/20/2017   Needs smoking cessation education 09/06/2016   Abnormal echocardiogram 09/05/2016   Abnormal EKG 07/25/2016   Preoperative cardiovascular examination 07/25/2016   Shortness of breath on exertion 07/25/2016   Dyslipidemia, goal LDL below 70 07/25/2016   High blood pressure 07/25/2016   Abnormal chest x-ray 07/04/2016   Peripheral vascular disease (HCC) 07/04/2016   Decreased pedal pulses 06/24/2016   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 No sig Dz in infrarenal Abd Aorta. Normal Bilat Iliac arteries. R SFA is 100% @ origin - recon in AK-Pop A. R PT A patent. L CFA occluded. L PFA recon @ origin, L SFA occluded w/ recon in AK Pop A.   ABDOMINAL AORTOGRAM W/LOWER EXTREMITY N/A 10/15/2019   Procedure: ABDOMINAL AORTOGRAM W/LOWER EXTREMITY;  Surgeon: Myra Gianotti,  Fran Lowes, MD;  Location: [facility];  Service: Cardiovascular;  Laterality: N/A;   AMPUTATION Left 10/04/2020   Procedure: LEFT ABOVE KNEE AMPUTATION;  Surgeon: Nada Libman, MD;  Location: [facility] OR;  Service: Vascular;  Laterality: Left;   AMPUTATION Left 12/08/2020   Procedure: RIGHT ABOVE KNEE AMPUTATION REVISION;  Surgeon: Chuck Hint, MD;  Location: [facility] OR;  Service: Vascular;  Laterality: Left;   AORTA - BILATERAL FEMORAL ARTERY  BYPASS GRAFT N/A 05/08/2020   Procedure: AORTA BIFEMORAL BYPASS GRAFT;  Surgeon: Nada Libman, MD;  Location: [facility];  Service: Vascular;  Laterality: N/A;   COLONOSCOPY  06/2016   never   ENDARTERECTOMY FEMORAL Left 11/14/2019   Procedure: Left groin exploration, Redo left femoral artery exposure;  Surgeon: Nada Libman, MD;  Location: [facility];  Service: Vascular;  Laterality: Left;   FEMORAL-FEMORAL BYPASS GRAFT N/A 11/13/2019   Procedure: BYPASS GRAFT FEMORAL-FEMORAL ARTERY RIGHT TO LEFT USING HEMASHIELD GOLD GRAFT 57mm x 30cm;  Surgeon: Nada Libman, MD;  Location: [facility] OR;  Service: Vascular;  Laterality: N/A;   FEMORAL-POPLITEAL BYPASS GRAFT Left 11/13/2019   Procedure: LEFT FEMORAL BELOW KNEE-POPLITEAL ARTERY USING NON-REVERSED GREATER SAPHENOUS VEIN;  Surgeon: Nada Libman, MD;  Location: [facility];  Service: Vascular;  Laterality: Left;   FEMORAL-POPLITEAL BYPASS GRAFT Left 05/08/2020   Procedure: LEFT REDO BYPASS GRAFT COMMON FEMORAL- BELOW KNEE POPLITEAL ARTERY USING PROPATEN GRAFT;  Surgeon: Nada Libman, MD;  Location: [facility];  Service: Vascular;  Laterality: Left;   I & D EXTREMITY Left 09/07/2020   Procedure: IRRIGATION AND DEBRIDEMENT LEFT LEG WITH EXCISION OF LEFT LEG DISTAL BYPASS GRAFT;  Surgeon: Cephus Shelling, MD;  Location: [facility];  Service: Vascular;  Laterality: Left;   LOWER EXTREMITY ANGIOGRAM Left 11/14/2019   Procedure: LEFT LOWER EXTREMITY ANGIOGRAM, BYPASS GRAFT ANGIOPLASTY;  Surgeon: Nada Libman, MD;  Location: [facility];  Service: Vascular;  Laterality: Left;   PERIPHERAL VASCULAR INTERVENTION  10/15/2019   Procedure: PERIPHERAL VASCULAR INTERVENTION;  Surgeon: Nada Libman, MD;  [facility];  Service: Cardiovascular;;  Rt Iliac   REMOVAL OF GRAFT Left 10/04/2020   Procedure: REMOVAL OF LEFT FEMORAL TO POPITEAL BYPASS GRAFT;  Surgeon: Nada Libman, MD;  Location: [facility];  Service: Vascular;  Laterality: Left;       Home  Medications    Prior to Admission medications   Medication Sig Start Date End Date Taking? Authorizing Provider  amLODipine (NORVASC) 2.5 MG tablet Take 1 tablet (2.5 mg total) by mouth daily. 07/28/21  Yes   atorvastatin (LIPITOR) 20 MG tablet Take 1 tablet (20 mg total) by mouth at bedtime. Patient taking differently: Take 20 mg by mouth daily. 06/28/21  Yes Arnette Felts, FNP  carvedilol (COREG) 6.25 MG tablet Take 1 tablet (6.25 mg total) by mouth 2 (two) times daily with a meal. 07/30/21  Yes Monge, Petra Kuba, NP  cloNIDine (CATAPRES - DOSED IN MG/24 HR) 0.1 mg/24hr patch Place 1 patch (0.1 mg total) onto the skin once every 7 (seven) days. 07/28/21  Yes   losartan (COZAAR) 50 MG tablet Take 1 tablet (50 mg total) by mouth in the morning and at bedtime. 07/05/21  Yes Raulkar, Drema Pry, MD  methocarbamol (ROBAXIN) 500 MG tablet Take 1 tablet (500 mg total) by mouth every 6 (six) hours as needed for muscle spasms. 12/25/20  Yes Love, Evlyn Kanner, PA-C  acetaminophen (TYLENOL) 325 MG tablet Take 1-2  tablets (325-650 mg total) by mouth every 4 (four) hours as needed for mild pain. 12/25/20   Love, Evlyn Kanner, PA-C  albuterol (VENTOLIN HFA) 108 (90 Base) MCG/ACT inhaler Inhale 1-2 puffs into the lungs every 6 (six) hours as needed for wheezing or shortness of breath. 06/12/21   Tanda Rockers A, DO  cloNIDine (CATAPRES - DOSED IN MG/24 HR) 0.1 mg/24hr patch Place 1 patch (0.1 mg total) onto the skin once a week. 07/05/21   Raulkar, Drema Pry, MD  clopidogrel (PLAVIX) 75 MG tablet Take 1 tablet (75 mg total) by mouth daily. 05/11/21     diphenhydrAMINE (BENADRYL) 25 MG tablet Take 25 mg by mouth every 6 (six) hours as needed for allergies.    [provider]  isosorbide mononitrate (IMDUR) 30 MG 24 hr tablet Take 1 tablet (30 mg total) by mouth once daily. 07/14/21     nortriptyline (PAMELOR) 50 MG capsule Take 2 capsules (100 mg total) by mouth at bedtime. 06/30/21   Al Decant, PA-C  senna (SENOKOT)  8.6 MG TABS tablet Take 1 tablet (8.6 mg total) by mouth 2 (two) times daily. 12/25/20   Love, Evlyn Kanner, PA-C    Family History Family History  Problem Relation Age of Onset   Arthritis Mother    COPD Father    Stroke Father    Multiple sclerosis Sister     Social History Social History   Tobacco Use   Smoking status: Former    Packs/day: 1.50    Years: 29.00    Total pack years: 43.50    Types: Cigarettes    Quit date: 10/22/2020    Years since quitting: 0.8   Smokeless tobacco: Never   Tobacco comments:    Quit smoking September 2022.  Using Nicoderm patch currently.  Vaping Use   Vaping Use: Never used  Substance Use Topics   Alcohol use: No   Drug use: No     Allergies   Gabapentin, Tetanus toxoids, Aspirin, and Garlic   Review of Systems Review of Systems  Cardiovascular:  Positive for chest pain.     Physical Exam Triage Vital Signs ED Triage Vitals  Enc Vitals Group     BP 08/10/21 1754 135/88     Pulse Rate 08/10/21 1754 84     Resp 08/10/21 1754 16     Temp 08/10/21 1754 98.8 F (37.1 C)     Temp Source 08/10/21 1754 Oral     SpO2 08/10/21 1754 91 %     Weight 08/10/21 1751 150 lb (68 kg)     Height 08/10/21 1751 5\' 8"  (1.727 m)     Head Circumference --      Peak Flow --      Pain Score 08/10/21 1751 7     Pain Loc --      Pain Edu? --      Excl. in GC? --    No data found.  Updated Vital Signs BP 135/88 (BP Location: Left Arm)   Pulse 84   Temp 98.8 F (37.1 C) (Oral)   Resp 16   Ht 5\' 8"  (1.727 m)   Wt 150 lb (68 kg)   SpO2 91%   BMI 22.81 kg/m    Physical Exam Vitals and nursing note reviewed.  Constitutional:      General: He is not in acute distress.    Appearance: He is well-developed.     Comments: Rollator walker  HENT:  Head: Normocephalic and atraumatic.  Eyes:     Conjunctiva/sclera: Conjunctivae normal.  Cardiovascular:     Rate and Rhythm: Normal rate and regular rhythm.     Heart sounds: No murmur  heard.    No systolic murmur is present.  Pulmonary:     Effort: Pulmonary effort is normal. No respiratory distress.     Breath sounds: Normal breath sounds. No decreased breath sounds or wheezing.  Musculoskeletal:        General: No swelling.     Cervical back: Neck supple.  Skin:    General: Skin is warm and dry.     Capillary Refill: Capillary refill takes less than 2 seconds.  Neurological:     General: No focal deficit present.     Mental Status: He is alert and oriented to person, place, and time.     Cranial Nerves: No cranial nerve deficit.  Psychiatric:        Mood and Affect: Mood normal.      UC Treatments / Results  Labs (all labs ordered are listed, but only abnormal results are displayed) Labs Reviewed - No data to display  EKG   Radiology DG Chest 2 View  Result Date: 08/10/2021 CLINICAL DATA:  Chest tightness. EXAM: CHEST - 2 VIEW COMPARISON:  07/22/2021 FINDINGS: Lungs are hyperexpanded. The lungs are clear without focal pneumonia, edema, pneumothorax or pleural effusion. Interstitial markings are diffusely coarsened with chronic features. The cardiopericardial silhouette is within normal limits for size. The visualized bony structures of the thorax are unremarkable. IMPRESSION: Hyperexpansion without acute cardiopulmonary findings. Electronically Signed   By: Kennith Center M.D.   On: 08/10/2021 07:14    Procedures Procedures (including critical care time)  Medications Ordered in UC Medications - No data to display  Initial Impression / Assessment and Plan / UC Course  I have reviewed the triage vital signs and the nursing notes.  Pertinent labs & imaging results that were available during my care of the patient were reviewed by me and considered in my medical decision making (see chart for details).     I personally reviewed patient's ED visit from earlier today.  He reports no changes.  Reassured patient that his vital signs are stable and his blood  pressure looks good.  He is going to continue on amlodipine 2.5 mg daily, Coreg 6.25 mg twice daily, clonidine patch once weekly, losartan 50 mg twice daily.  He is going to follow-up with his PCP and cardiologist.  Strict ER precautions discussed with the patient.  He is understanding and agreeable.  He is stable for discharge this evening.   Final Clinical Impressions(s) / UC Diagnoses   Final diagnoses:  Essential hypertension  Tinnitus of left ear     Discharge Instructions      Great to meet you today! Your blood pressure is stable, all of your vitals look good. I reviewed your labs and imaging from ED this morning, everything looks normal. Please see handout for ideas on how to have a low-salt, heart friendly diet. Continue to take your regular medications. Look into a sound machine to help mask the ringing in your ear.     ED Prescriptions   None    PDMP not reviewed this encounter.   AllwardtCrist Infante, PA-C 08/10/21 9735

## 2021-08-10 NOTE — Discharge Instructions (Addendum)
Great to meet you today! Your blood pressure is stable, all of your vitals look good. I reviewed your labs and imaging from ED this morning, everything looks normal. Please see handout for ideas on how to have a low-salt, heart friendly diet. Continue to take your regular medications. Look into a sound machine to help mask the ringing in your ear.

## 2021-08-10 NOTE — ED Triage Notes (Signed)
Patient having elevated BP, chest tightness, blurred vision, and ringing in the ears for 2 days.   Last took blood pressure medication this morning.

## 2021-08-10 NOTE — ED Provider Notes (Signed)
[facility] EMERGENCY DEPARTMENT Provider Note   CSN: [id number] Arrival date & time: 08/10/21  0539     History  Chief Complaint  Patient presents with   Chest Tightness / Hypertension     Benjamin [name] is a 61 y.o. male.  Presented to ER for chest pain.  Says that pain started yesterday afternoon, lasted for couple hours and then seem to subside.  Earlier this morning he still had some tightness but this seemed to go away.  No difficulty in breathing.  Does feel lightheaded at times.  Does feel dizzy at times.  No room spinning sensation at this time.  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  HPI     Home Medications Prior to Admission medications   Medication Sig Start Date End Date Taking? Authorizing Provider  acetaminophen (TYLENOL) 325 MG tablet Take 1-2 tablets (325-650 mg total) by mouth every 4 (four) hours as needed for mild pain. 12/25/20   Love, Evlyn Kanner, PA-C  albuterol (VENTOLIN HFA) 108 (90 Base) MCG/ACT inhaler Inhale 1-2 puffs into the lungs every 6 (six) hours as needed for wheezing or shortness of breath. 06/12/21   Tanda Rockers A, DO  amLODipine (NORVASC) 2.5 MG tablet Take 1 tablet (2.5 mg total) by mouth daily. 07/28/21     atorvastatin (LIPITOR) 20 MG tablet Take 1 tablet (20 mg total) by mouth at bedtime. Patient taking differently: Take 20 mg by mouth daily. 06/28/21   Arnette Felts, FNP  carvedilol (COREG) 6.25 MG tablet Take 1 tablet (6.25 mg total) by mouth 2 (two) times daily with a meal. 07/30/21   Monge, Petra Kuba, NP  cloNIDine (CATAPRES - DOSED IN MG/24 HR) 0.1 mg/24hr patch Place 1 patch (0.1 mg total) onto the skin once a week. 07/05/21   Raulkar, Drema Pry, MD  cloNIDine (CATAPRES - DOSED IN MG/24 HR) 0.1 mg/24hr patch Place 1 patch (0.1 mg total) onto the skin once  every 7 (seven) days. 07/28/21     clopidogrel (PLAVIX) 75 MG tablet Take 1 tablet (75 mg total) by mouth daily. 05/11/21     diphenhydrAMINE (BENADRYL) 25 MG tablet Take 25 mg by mouth every 6 (six) hours as needed for allergies.    [provider]  docusate sodium (COLACE) 100 MG capsule Take 1 capsule (100 mg total) by mouth daily. 03/16/21   Raulkar, Drema Pry, MD  famotidine (PEPCID) 20 MG tablet Take 1 tablet (20 mg total) by mouth daily. 07/22/21 08/21/21  Nira Conn, MD  gabapentin (NEURONTIN) 600 MG tablet Take 1 tablet (600 mg total) by mouth 2 (two) times daily. Patient taking differently: Take 600 mg by mouth 2 (two) times daily as needed (leg pain). 03/22/21   Raulkar, Drema Pry, MD  guaiFENesin-dextromethorphan (ROBITUSSIN DM) 100-10 MG/5ML syrup Take 15 mLs by mouth every 4 (four) hours as needed for cough. 12/15/20   Lorin Glass, MD  isosorbide mononitrate (IMDUR) 30 MG 24 hr tablet Take 1 tablet (30 mg total) by mouth once daily. 07/14/21     losartan (COZAAR) 50 MG tablet Take 1 tablet (50 mg total) by mouth in the morning and at bedtime. 07/05/21   Raulkar, Drema Pry, MD  methocarbamol (ROBAXIN) 500 MG tablet Take 1 tablet (500 mg total) by mouth every 6 (six) hours as needed for muscle  spasms. 12/25/20   Love, Evlyn Kanner, PA-C  Multiple Vitamin (MULTIVITAMIN WITH MINERALS) TABS tablet Take 1 tablet by mouth daily. 12/16/20   Lorin Glass, MD  nortriptyline (PAMELOR) 50 MG capsule Take 2 capsules (100 mg total) by mouth at bedtime. 06/30/21   Al Decant, PA-C  senna (SENOKOT) 8.6 MG TABS tablet Take 1 tablet (8.6 mg total) by mouth 2 (two) times daily. 12/25/20   Love, Evlyn Kanner, PA-C      Allergies    Gabapentin, Tetanus toxoids, Aspirin, and Garlic    Review of Systems   Review of Systems  Cardiovascular:  Positive for chest pain.  All other systems reviewed and are negative.   Physical Exam Updated Vital Signs BP (!) 133/92 (BP Location: Left Arm)    Pulse 68   Temp 98.5 F (36.9 C) (Oral)   Resp 16   SpO2 98%  Physical Exam Vitals and nursing note reviewed.  Constitutional:      General: He is not in acute distress.    Appearance: He is well-developed.  HENT:     Head: Normocephalic and atraumatic.  Eyes:     Conjunctiva/sclera: Conjunctivae normal.  Cardiovascular:     Rate and Rhythm: Normal rate and regular rhythm.     Heart sounds: No murmur heard. Pulmonary:     Effort: Pulmonary effort is normal. No respiratory distress.     Breath sounds: Normal breath sounds.  Abdominal:     Palpations: Abdomen is soft.     Tenderness: There is no abdominal tenderness.  Musculoskeletal:        General: No swelling.     Cervical back: Neck supple.  Skin:    General: Skin is warm and dry.     Capillary Refill: Capillary refill takes less than 2 seconds.  Neurological:     Mental Status: He is alert.  Psychiatric:        Mood and Affect: Mood normal.     ED Results / Procedures / Treatments   Labs (all labs ordered are listed, but only abnormal results are displayed) Labs Reviewed  BASIC METABOLIC PANEL - Abnormal; Notable for the following components:      Result Value   CO2 21 (*)    Glucose, Bld 133 (*)    Calcium 8.8 (*)    All other components within normal limits  CBC - Abnormal; Notable for the following components:   Hemoglobin 11.5 (*)    HCT 35.0 (*)    MCV 69.2 (*)    MCH 22.7 (*)    RDW 19.0 (*)    All other components within normal limits  TROPONIN I (HIGH SENSITIVITY)  TROPONIN I (HIGH SENSITIVITY)    EKG EKG Interpretation  Date/Time:  Tuesday August 10 2021 08:16:48 EDT Ventricular Rate:  57 PR Interval:  139 QRS Duration: 94 QT Interval:  432 QTC Calculation: 421 R Axis:   -20 Text Interpretation: Sinus rhythm Borderline left axis deviation Abnormal T, consider ischemia, diffuse leads No significant change since last tracing Confirmed by Marianna Fuss ([id number]) on 08/10/2021 8:59:24  AM  Radiology DG Chest 2 View  Result Date: 08/10/2021 CLINICAL DATA:  Chest tightness. EXAM: CHEST - 2 VIEW COMPARISON:  07/22/2021 FINDINGS: Lungs are hyperexpanded. The lungs are clear without focal pneumonia, edema, pneumothorax or pleural effusion. Interstitial markings are diffusely coarsened with chronic features. The cardiopericardial silhouette is within normal limits for size. The visualized bony structures of the thorax are unremarkable. IMPRESSION: Hyperexpansion without acute  cardiopulmonary findings. Electronically Signed   By: Kennith Center M.D.   On: 08/10/2021 07:14    Procedures Procedures    Medications Ordered in ED Medications - No data to display  ED Course/ Medical Decision Making/ A&P                           Medical Decision Making  61 year old gentleman presenting to ER due to concern for chest pain.  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  Here patient is remarkably well-appearing in no distress.  His EKG does not demonstrate any acute changes from prior.  His troponin x2 is within normal limits.  Doubt ACS.  I independently reviewed and interpreted CXR, no acute infiltrate or effusion or pneumothorax.  No electrolyte derangement, no anemia.  No ongoing pain at this time.  Given work-up, feel patient is appropriate for discharge and outpatient management.  Advise follow-up with cardiology and primary care doctor.  After the discussed management above, the patient was determined to be safe for discharge.  The patient was in agreement with this plan and all questions regarding their care were answered.  ED return precautions were discussed and the patient will return to the ED with any significant worsening of condition.         Final Clinical Impression(s) / ED Diagnoses Final diagnoses:  Chest pain, unspecified type    Rx / DC Orders ED Discharge Orders     None         Milagros Loll,  MD 08/10/21 [phone]

## 2021-08-10 NOTE — Discharge Instructions (Signed)
Please follow-up with your primary doctor and your cardiologist.  Come back to ER for new or worsening chest pain, difficulty breathing, or other new concerning symptom.

## 2021-08-14 ENCOUNTER — Encounter (HOSPITAL_COMMUNITY): Payer: Self-pay | Admitting: *Deleted

## 2021-08-14 ENCOUNTER — Ambulatory Visit (HOSPITAL_COMMUNITY)
Admission: EM | Admit: 2021-08-14 | Discharge: 2021-08-14 | Disposition: A | Discharge status: Home / Self Care | Payer: Medicaid Other | Attending: Physician Assistant | Admitting: Physician Assistant

## 2021-08-14 ENCOUNTER — Other Ambulatory Visit: Payer: Self-pay

## 2021-08-14 DIAGNOSIS — I10 Essential (primary) hypertension: Secondary | ICD-10-CM | POA: Diagnosis not present

## 2021-08-14 DIAGNOSIS — G4709 Other insomnia: Secondary | ICD-10-CM

## 2021-08-14 MED ORDER — HYDROXYZINE HCL 25 MG PO TABS: 25.0000 mg | ORAL_TABLET | Freq: Every evening | ORAL | 0 refills | Status: DC | PRN

## 2021-08-14 MED ORDER — HYDROXYZINE HCL 25 MG PO TABS
25.0000 mg | ORAL_TABLET | Freq: Every evening | ORAL | 0 refills | Status: DC | PRN
  Filled 2021-08-14: qty 21, 21d supply, fill #0

## 2021-08-16 ENCOUNTER — Other Ambulatory Visit: Payer: Self-pay

## 2021-08-19 ENCOUNTER — Ambulatory Visit (HOSPITAL_COMMUNITY)
Admission: EM | Admit: 2021-08-19 | Discharge: 2021-08-19 | Disposition: A | Discharge status: Home / Self Care | Payer: Medicaid Other | Attending: Physician Assistant | Admitting: Physician Assistant

## 2021-08-19 ENCOUNTER — Other Ambulatory Visit: Payer: Self-pay

## 2021-08-19 ENCOUNTER — Encounter (HOSPITAL_COMMUNITY): Payer: Self-pay | Admitting: Emergency Medicine

## 2021-08-19 DIAGNOSIS — I10 Essential (primary) hypertension: Secondary | ICD-10-CM

## 2021-08-19 DIAGNOSIS — Z760 Encounter for issue of repeat prescription: Secondary | ICD-10-CM | POA: Diagnosis not present

## 2021-08-19 MED ORDER — AMLODIPINE BESYLATE 2.5 MG PO TABS
2.5000 mg | ORAL_TABLET | Freq: Every day | ORAL | 0 refills | Status: DC
  Filled 2021-08-19 – 2021-09-14 (×2): qty 30, 30d supply, fill #0

## 2021-08-19 NOTE — ED Provider Notes (Signed)
[facility]    CSN: [id number] Arrival date & time: 08/19/21  7353      History   Chief Complaint Chief Complaint  Patient presents with   Medication Refill    HPI Benjamin [name] is a 61 y.o. male.   Patient complains of being out of his blood pressure medicines.  Patient is requesting a refill on amlodipine.  Patient states he gets his medicines at the [facility] wellness clinic.  He states he has an appointment today at 1:00 to see his primary care physician to discuss ongoing medication management  The history is provided by the patient. No language interpreter was used.  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   Protein-calorie malnutrition, severe 12/09/2020   Wound cellulitis 12/07/2020   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/2020   Ischemia of lower extremity 10/01/2020   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   Femoral-popliteal bypass graft occlusion, 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   PAD (peripheral artery disease) (HCC) 11/13/2019   Callus of foot 07/01/2019   Intermittent claudication (HCC) 01/20/2017   Need for influenza vaccination 01/20/2017   Needs smoking cessation education 09/06/2016   Abnormal echocardiogram 09/05/2016   Abnormal EKG 07/25/2016   Preoperative cardiovascular examination 07/25/2016   Shortness of breath on exertion 07/25/2016   Dyslipidemia, goal LDL below 70 07/25/2016   High blood pressure 07/25/2016   Abnormal chest x-ray  07/04/2016   Peripheral vascular disease (HCC) 07/04/2016   Decreased pedal pulses 06/24/2016   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 No sig Dz in infrarenal Abd Aorta. Normal Bilat Iliac arteries. R SFA is 100% @ origin - recon in AK-Pop A. R PT A patent. L CFA occluded. L PFA recon @ origin, L SFA occluded w/ recon in AK Pop A.   ABDOMINAL AORTOGRAM W/LOWER EXTREMITY N/A 10/15/2019   Procedure: ABDOMINAL AORTOGRAM W/LOWER EXTREMITY;  Surgeon: Nada Libman, MD;  [facility]: [facility];  Service: Cardiovascular;  Laterality: N/A;   AMPUTATION Left 10/04/2020   Procedure: LEFT ABOVE KNEE AMPUTATION;  Surgeon: Nada Libman, MD;  Location: [facility] OR;  Service: Vascular;  Laterality: Left;   AMPUTATION Left 12/08/2020   Procedure: RIGHT ABOVE KNEE AMPUTATION REVISION;  Surgeon: Chuck Hint, MD;  Location: [facility] OR;  Service: Vascular;  Laterality: Left;   AORTA - BILATERAL FEMORAL ARTERY BYPASS GRAFT N/A 05/08/2020   Procedure: AORTA BIFEMORAL BYPASS GRAFT;  Surgeon: Nada Libman, MD;  Location: [facility];  Service: Vascular;  Laterality: N/A;   COLONOSCOPY  06/2016  never   ENDARTERECTOMY FEMORAL Left 11/14/2019   Procedure: Left groin exploration, Redo left femoral artery exposure;  Surgeon: Nada Libman, MD;  Location: [facility] OR;  Service: Vascular;  Laterality: Left;   FEMORAL-FEMORAL BYPASS GRAFT N/A 11/13/2019   Procedure: BYPASS GRAFT FEMORAL-FEMORAL ARTERY RIGHT TO LEFT USING HEMASHIELD GOLD GRAFT 29mm x 30cm;  Surgeon: Nada Libman, MD;   Location: [facility] OR;  Service: Vascular;  Laterality: N/A;   FEMORAL-POPLITEAL BYPASS GRAFT Left 11/13/2019   Procedure: LEFT FEMORAL BELOW KNEE-POPLITEAL ARTERY USING NON-REVERSED GREATER SAPHENOUS VEIN;  Surgeon: Nada Libman, MD;  Location: [facility];  Service: Vascular;  Laterality: Left;   FEMORAL-POPLITEAL BYPASS GRAFT Left 05/08/2020   Procedure: LEFT REDO BYPASS GRAFT COMMON FEMORAL- BELOW KNEE POPLITEAL ARTERY USING PROPATEN GRAFT;  Surgeon: Nada Libman, MD;  Location: [facility];  Service: Vascular;  Laterality: Left;   I & D EXTREMITY Left 09/07/2020   Procedure: IRRIGATION AND DEBRIDEMENT LEFT LEG WITH EXCISION OF LEFT LEG DISTAL BYPASS GRAFT;  Surgeon: Cephus Shelling, MD;  Location: [facility];  Service: Vascular;  Laterality: Left;   LOWER EXTREMITY ANGIOGRAM Left 11/14/2019   Procedure: LEFT LOWER EXTREMITY ANGIOGRAM, BYPASS GRAFT ANGIOPLASTY;  Surgeon: Nada Libman, MD;  Location: [facility];  Service: Vascular;  Laterality: Left;   PERIPHERAL VASCULAR INTERVENTION  10/15/2019   Procedure: PERIPHERAL VASCULAR INTERVENTION;  Surgeon: Nada Libman, MD;  [facility];  Service: Cardiovascular;;  Rt Iliac   REMOVAL OF GRAFT Left 10/04/2020   Procedure: REMOVAL OF LEFT FEMORAL TO POPITEAL BYPASS GRAFT;  Surgeon: Nada Libman, MD;  Location: [facility];  Service: Vascular;  Laterality: Left;       Home Medications    Prior to Admission medications   Medication Sig Start Date End Date Taking? Authorizing Provider  carvedilol (COREG) 6.25 MG tablet Take 1 tablet (6.25 mg total) by mouth 2 (two) times daily with a meal. 07/30/21  Yes Monge, Petra Kuba, NP  clopidogrel (PLAVIX) 75 MG tablet Take 1 tablet (75 mg total) by mouth daily. 05/11/21  Yes   losartan (COZAAR) 50 MG tablet Take 1 tablet (50 mg total) by mouth in the morning and at bedtime. 07/05/21  Yes Raulkar, Drema Pry, MD  acetaminophen (TYLENOL) 325 MG tablet Take 1-2 tablets (325-650 mg total) by mouth every 4  (four) hours as needed for mild pain. 12/25/20   Love, Evlyn Kanner, PA-C  albuterol (VENTOLIN HFA) 108 (90 Base) MCG/ACT inhaler Inhale 1-2 puffs into the lungs every 6 (six) hours as needed for wheezing or shortness of breath. 06/12/21   Tanda Rockers A, DO  amLODipine (NORVASC) 2.5 MG tablet Take 1 tablet (2.5 mg total) by mouth daily. 08/19/21   Elson Areas, PA-C  atorvastatin (LIPITOR) 20 MG tablet Take 1 tablet (20 mg total) by mouth at bedtime. Patient taking differently: Take 20 mg by mouth daily. 06/28/21   Arnette Felts, FNP  cloNIDine (CATAPRES - DOSED IN MG/24 HR) 0.1 mg/24hr patch Place 1 patch (0.1 mg total) onto the skin once a week. 07/05/21   Raulkar, Drema Pry, MD  cloNIDine (CATAPRES - DOSED IN MG/24 HR) 0.1 mg/24hr patch Place 1 patch (0.1 mg total) onto the skin once every 7 (seven) days. 07/28/21     diphenhydrAMINE (BENADRYL) 25 MG tablet Take 25 mg by mouth every 6 (six) hours as needed for allergies.    [provider]  hydrOXYzine (ATARAX) 25 MG tablet Take 1 tablet (25  mg total) by mouth at bedtime as needed (for sleep.). 08/14/21   Carlisle Beers, FNP  isosorbide mononitrate (IMDUR) 30 MG 24 hr tablet Take 1 tablet (30 mg total) by mouth once daily. 07/14/21     methocarbamol (ROBAXIN) 500 MG tablet Take 1 tablet (500 mg total) by mouth every 6 (six) hours as needed for muscle spasms. 12/25/20   Love, Evlyn Kanner, PA-C  nortriptyline (PAMELOR) 50 MG capsule Take 2 capsules (100 mg total) by mouth at bedtime. 06/30/21   Al Decant, PA-C  senna (SENOKOT) 8.6 MG TABS tablet Take 1 tablet (8.6 mg total) by mouth 2 (two) times daily. 12/25/20   Love, Evlyn Kanner, PA-C    Family History Family History  Problem Relation Age of Onset   Arthritis Mother    COPD Father    Stroke Father    Multiple sclerosis Sister     Social History Social History   Tobacco Use   Smoking status: Former    Packs/day: 1.50    Years: 29.00    Total pack years: 43.50    Types:  Cigarettes    Quit date: 10/22/2020    Years since quitting: 0.8   Smokeless tobacco: Never   Tobacco comments:    Quit smoking September 2022.  Using Nicoderm patch currently.  Vaping Use   Vaping Use: Never used  Substance Use Topics   Alcohol use: No   Drug use: No     Allergies   Gabapentin, Tetanus toxoids, Aspirin, and Garlic   Review of Systems Review of Systems  All other systems reviewed and are negative.    Physical Exam Triage Vital Signs ED Triage Vitals  Enc Vitals Group     BP 08/19/21 0906 (!) 157/85     Pulse Rate 08/19/21 0906 79     Resp 08/19/21 0906 16     Temp 08/19/21 0906 98.5 F (36.9 C)     Temp Source 08/19/21 0906 Oral     SpO2 08/19/21 0906 91 %     Weight --      Height --      Head Circumference --      Peak Flow --      Pain Score 08/19/21 0907 0     Pain Loc --      Pain Edu? --      Excl. in GC? --    No data found.  Updated Vital Signs BP (!) 157/85 (BP Location: Right Arm)   Pulse 79   Temp 98.5 F (36.9 C) (Oral)   Resp 16   SpO2 91%   Visual Acuity Right Eye Distance:   Left Eye Distance:   Bilateral Distance:    Right Eye Near:   Left Eye Near:    Bilateral Near:     Physical Exam Vitals and nursing note reviewed.  Constitutional:      Appearance: He is well-developed.  HENT:     Head: Normocephalic.  Pulmonary:     Effort: Pulmonary effort is normal.  Abdominal:     General: There is no distension.  Musculoskeletal:        General: Normal range of motion.     Cervical back: Normal range of motion.  Neurological:     Mental Status: He is alert and oriented to person, place, and time.      UC Treatments / Results  Labs (all labs ordered are listed, but only abnormal results are displayed) Labs Reviewed - No  data to display  EKG   Radiology No results found.  Procedures Procedures (including critical care time)  Medications Ordered in UC Medications - No data to display  Initial  Impression / Assessment and Plan / UC Course  I have reviewed the triage vital signs and the nursing notes.  Pertinent labs & imaging results that were available during my care of the patient were reviewed by me and considered in my medical decision making (see chart for details).     When I reviewed patient's medication list it appears he has been here and has multiple prescriptions for blood pressure medicines that have not been filled.  I spoke with the pharmacy at [facility] they will review his medicines and make sure that he has access to all of his medicines I received a message from [facility] health and [facility] they are no longer feeling his medicines he is getting med packs by mail.  They were able to confirm that patient has all of his medications being sent to him.. Final Clinical Impressions(s) / UC Diagnoses   Final diagnoses:  Medication refill  Essential hypertension     Discharge Instructions      See your primary care doctor today as scheduled.     ED Prescriptions     Medication Sig Dispense Auth. Provider   amLODipine (NORVASC) 2.5 MG tablet Take 1 tablet (2.5 mg total) by mouth daily. 30 tablet Elson Areas, [place]      PDMP not reviewed this encounter. An After Visit Summary was printed and given to the patient.    Elson Areas, [place] 08/19/21 1012

## 2021-08-19 NOTE — Discharge Instructions (Signed)
See your primary care doctor today as scheduled.

## 2021-08-19 NOTE — ED Triage Notes (Signed)
Patient presents for medication refill.   Patient needs amlodipine 2.5 and carvedilol 6.25 mg refill.   Patient was educated on establishing PCP, patient states he has an appointment today with a PCP per patient statement.

## 2021-08-20 ENCOUNTER — Encounter: Payer: Self-pay | Admitting: Nurse Practitioner

## 2021-08-20 ENCOUNTER — Ambulatory Visit (INDEPENDENT_AMBULATORY_CARE_PROVIDER_SITE_OTHER): Payer: Medicaid Other | Admitting: Nurse Practitioner

## 2021-08-20 VITALS — BP 118/80 | HR 76 | Ht 68.0 in | Wt 160.6 lb

## 2021-08-20 DIAGNOSIS — R0789 Other chest pain: Secondary | ICD-10-CM

## 2021-08-20 DIAGNOSIS — Z720 Tobacco use: Secondary | ICD-10-CM

## 2021-08-20 DIAGNOSIS — I739 Peripheral vascular disease, unspecified: Secondary | ICD-10-CM

## 2021-08-20 DIAGNOSIS — I10 Essential (primary) hypertension: Secondary | ICD-10-CM | POA: Diagnosis not present

## 2021-08-20 DIAGNOSIS — E782 Mixed hyperlipidemia: Secondary | ICD-10-CM

## 2021-08-20 MED ORDER — NITROGLYCERIN 0.4 MG SL SUBL: 0.4000 mg | SUBLINGUAL_TABLET | SUBLINGUAL | 4 refills | Status: AC | PRN

## 2021-08-20 MED ORDER — METOPROLOL TARTRATE 100 MG PO TABS: ORAL_TABLET | ORAL | 0 refills | Status: DC

## 2021-08-20 NOTE — Progress Notes (Addendum)
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  Past Medical History    Past Medical History:  Diagnosis Date   Back pain    Diabetes mellitus without complication (HCC)    Heavy smoker    Hyperlipidemia LDL goal <70    Hypertension    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 No sig Dz in infrarenal Abd Aorta. Normal Bilat Iliac arteries. R SFA is 100% @ origin - recon in AK-Pop A. R PT A patent. L CFA occluded. L PFA recon @ origin, L SFA occluded w/ recon in AK Pop A.   ABDOMINAL AORTOGRAM W/LOWER EXTREMITY N/A 10/15/2019   Procedure: ABDOMINAL AORTOGRAM W/LOWER EXTREMITY;  Surgeon: Nada Libman, MD;  [facility]: [facility];  Service: Cardiovascular;  Laterality: N/A;   AMPUTATION Left 10/04/2020   Procedure: LEFT ABOVE KNEE AMPUTATION;  Surgeon: Nada Libman, MD;  Location: [facility] OR;  Service: Vascular;  Laterality: Left;   AMPUTATION Left 12/08/2020   Procedure: RIGHT ABOVE KNEE AMPUTATION REVISION;  Surgeon: Chuck Hint, MD;  Location: [facility] OR;  Service: Vascular;  Laterality: Left;   AORTA - BILATERAL FEMORAL ARTERY BYPASS GRAFT N/A 05/08/2020   Procedure: AORTA BIFEMORAL BYPASS GRAFT;  Surgeon: Nada Libman, MD;  Location: [facility]  OR;  Service: Vascular;  Laterality: N/A;   COLONOSCOPY  06/2016   never   ENDARTERECTOMY FEMORAL Left 11/14/2019   Procedure: Left groin exploration, Redo left femoral artery exposure;  Surgeon: Nada Libman, MD;  Location: [facility] OR;  Service: Vascular;  Laterality: Left;   FEMORAL-FEMORAL BYPASS GRAFT N/A 11/13/2019   Procedure: BYPASS GRAFT FEMORAL-FEMORAL ARTERY RIGHT TO LEFT USING HEMASHIELD GOLD GRAFT 24mm x 30cm;  Surgeon: Nada Libman, MD;  Location: [facility] OR;  Service: Vascular;  Laterality: N/A;   FEMORAL-POPLITEAL BYPASS GRAFT Left 11/13/2019   Procedure: LEFT FEMORAL BELOW KNEE-POPLITEAL ARTERY USING NON-REVERSED GREATER SAPHENOUS VEIN;  Surgeon: Nada Libman, MD;  [facility];  Service: Vascular;  Laterality: Left;   FEMORAL-POPLITEAL BYPASS GRAFT Left 05/08/2020   Procedure: LEFT REDO BYPASS GRAFT COMMON FEMORAL- BELOW KNEE POPLITEAL ARTERY USING PROPATEN GRAFT;  Surgeon: Nada Libman, MD;  [facility] OR;  Service: Vascular;  Laterality: Left;   I & D EXTREMITY Left 09/07/2020   Procedure: IRRIGATION AND DEBRIDEMENT LEFT LEG WITH EXCISION OF LEFT LEG DISTAL BYPASS GRAFT;  Surgeon: Cephus Shelling, MD;  Location: [facility] OR;  Service: Vascular;  Laterality: Left;   LOWER EXTREMITY ANGIOGRAM Left 11/14/2019   Procedure: LEFT LOWER EXTREMITY ANGIOGRAM, BYPASS GRAFT ANGIOPLASTY;  Surgeon: Nada Libman, MD;  Location: [facility];  Service: Vascular;  Laterality: Left;   PERIPHERAL VASCULAR INTERVENTION  10/15/2019   Procedure: PERIPHERAL VASCULAR INTERVENTION;  Surgeon: Nada Libman, MD;  [facility];  Service: Cardiovascular;;  Rt Iliac   REMOVAL OF GRAFT Left 10/04/2020   Procedure: REMOVAL OF LEFT FEMORAL TO POPITEAL BYPASS GRAFT;  Surgeon: Serafina Mitchell, MD;  Location: [place];  Service: Vascular;  Laterality: Left;    Allergies  Allergies  Allergen Reactions   Gabapentin Other (See Comments)    Causes lethargy   Tetanus Toxoids Other (See  Comments)    Tinnitus started after he took this   Aspirin Nausea And Vomiting   Garlic Other (See Comments)    Sneezing     History of Present Illness    61 year old male with the above past medical history including chest pain, hypertension, hyperlipidemia, PAD s/p multiple interventions 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    He has a history of heavy tobacco use and PAD s/p multiple interventions, follows with vascular surgery.  He ultimately underwent left AKA in October 2022. He was last seen in the office on 10/29/2019 for preoperative clearance for left leg femorofemoral bypass.  He was stable from a cardiac standpoint at the time.  He underwent stress testing at the time which showed showed no evidence of ischemia, low risk, EF 45-50%.  Has not been seen in the office since. He has recently had multiple ED visits setting of chest pain, shortness of breath, hypertension, and ringing in his ears, recently on 07/19/2021.  Work-up to date including CT angio chest, troponin, EKG, and chest x-ray have all been  within normal limits.   He was last seen in the office on 07/20/2021 and was stable overall from a cardiac standpoint.  He did note chest tightness in the setting of elevated BP associated tinnitus.  Amlodipine was increased to 5 mg daily, reduce sodium intake was advised.  Since that visit he has been evaluated at urgent care/ED 7 different times for elevated BP, chest tightness, tinnitus.  Work-up has been unremarkable.  He presents today for follow-up. Since his last visit he has been stable from a cardiac standpoint.  He continues intermittently elevated BP.  He notices elevated BP mostly at nighttime.  He has not been taking all of his BP medication consistently. He also notes intermittent chest tightness at rest and with exertion. Troponin has been negative at recent urgent care/ED evaluations.  Additionally, he reports generalized fatigue. Other than  his labile blood pressure, intermittent chest tightness, and fatigue, he denies any additional concerns today.  Home Medications    Current Outpatient Medications  Medication Sig Dispense Refill   acetaminophen (TYLENOL) 325 MG tablet Take 1-2 tablets (325-650 mg total) by mouth every 4 (four) hours as needed for mild pain. 200 tablet 0   albuterol (VENTOLIN HFA) 108 (90 Base) MCG/ACT inhaler Inhale 1-2 puffs into the lungs every 6 (six) hours as needed for wheezing or shortness of breath. 18 g 0   amLODipine (NORVASC) 2.5 MG tablet Take 1 tablet (2.5 mg total) by mouth daily. 30 tablet 0   atorvastatin (LIPITOR) 20 MG tablet Take 1 tablet (20 mg total) by mouth at bedtime. (Patient taking differently: Take 20 mg by mouth daily.) 90 tablet 1   carvedilol (COREG) 6.25 MG tablet Take 1 tablet (6.25 mg total) by mouth 2 (two) times daily with a meal. 120 tablet 3   cloNIDine (CATAPRES - DOSED IN MG/24 HR) 0.1 mg/24hr patch Place 1 patch (0.1 mg total) onto the skin once a week. 4 patch 12   cloNIDine (CATAPRES - DOSED IN MG/24  HR) 0.1 mg/24hr patch Place 1 patch (0.1 mg total) onto the skin once every 7 (seven) days. 1 patch 0   clopidogrel (PLAVIX) 75 MG tablet Take 1 tablet (75 mg total) by mouth daily. 30 tablet 0   clopidogrel (PLAVIX) 75 MG tablet Take 1 tablet by mouth daily.     diphenhydrAMINE (BENADRYL) 25 MG tablet Take 25 mg by mouth every 6 (six) hours as needed for allergies.     hydrOXYzine (ATARAX) 25 MG tablet Take 1 tablet (25 mg total) by mouth at bedtime as needed (for sleep.). 21 tablet 0   isosorbide mononitrate (IMDUR) 30 MG 24 hr tablet Take 1 tablet (30 mg total) by mouth once daily. 30 tablet 4   losartan (COZAAR) 50 MG tablet Take 1 tablet (50 mg total) by mouth in the morning and at bedtime. 180 tablet 1   methocarbamol (ROBAXIN) 500 MG tablet Take 1 tablet (500 mg total) by mouth every 6 (six) hours as needed for muscle spasms. 30 tablet 0   metoprolol tartrate  (LOPRESSOR) 100 MG tablet Take 1 tablet 2 hours prior to procedure. 1 tablet 0   senna (SENOKOT) 8.6 MG TABS tablet Take 1 tablet (8.6 mg total) by mouth 2 (two) times daily. 60 tablet 0   nitroGLYCERIN (NITROSTAT) 0.4 MG SL tablet Place 1 tablet (0.4 mg total) under the tongue every 5 (five) minutes as needed for chest pain. 25 tablet 4   No current facility-administered medications for this visit.     Review of Systems    He denies palpitations, dyspnea, pnd, orthopnea, n, v, dizziness, syncope, edema, weight gain, or early satiety. All other systems reviewed and are otherwise negative except as noted above.   Physical Exam    VS:  BP 118/80   Pulse 76   Ht 5\' 8"  (1.727 m)   Wt 160 lb 9.6 oz (72.8 kg)   SpO2 93%   BMI 24.42 kg/m  GEN: Well nourished, well developed, in no acute distress. HEENT: normal. Neck: Supple, no JVD, carotid bruits, or masses. Cardiac: RRR, no murmurs, rubs, or gallops. No clubbing, cyanosis, edema.  Radials/DP/PT 2+ and equal bilaterally.  Respiratory:  Respirations regular and unlabored, clear to auscultation bilaterally. GI: Soft, nontender, nondistended, BS + x 4. MS: L AKA, otherwise, no deformity or atrophy. Skin: warm and dry, no rash. Neuro:  Strength and sensation are intact. Psych: Normal affect.  Accessory Clinical Findings    ECG personally reviewed by me today -no EKG in office today.  Lab Results  Component Value Date   WBC 5.1 08/10/2021   HGB 11.5 (L) 08/10/2021   HCT 35.0 (L) 08/10/2021   MCV 69.2 (L) 08/10/2021   PLT 312 08/10/2021   Lab Results  Component Value Date   CREATININE 0.88 08/10/2021   BUN 11 08/10/2021   NA 140 08/10/2021   K 3.5 08/10/2021   CL 109 08/10/2021   CO2 21 (L) 08/10/2021   Lab Results  Component Value Date   ALT 12 07/22/2021   AST 16 07/22/2021   ALKPHOS 140 (H) 07/22/2021   BILITOT 0.3 07/22/2021   Lab Results  Component Value Date   CHOL 193 02/24/2021   HDL 48 02/24/2021   LDLCALC  118 (H) 02/24/2021   TRIG 152 (H) 02/24/2021   CHOLHDL 4.0 02/24/2021    Lab Results  Component Value Date   HGBA1C 6.1 (H) 02/24/2021    Assessment & Plan    1. Hypertension: He notes ongoing  intermittent elevated BP with associated chest tightness, ringing/fullness in his ears. Amlodipine was increased at last OV, however, he has not been taking amlodipine recently.  It appears he has not been taking his BP medications consistently overall.  We discussed the importance of adherence to medication regimen.  He will begin taking amlodipine 2.5 mg in the evenings.  Otherwise, continue current antihypertensive regimen.  BP cuff provided in office today.  Advised him to keep BP log and report BP consistently > 130/80, SBP <110. if BP remains elevated above goal, could consider increasing amlodipine versus switching from losartan to valsartan.    2. Atypical chest pain: Stress test in 2021 was low risk, EF 45-50%. He notes recent chest tightness associated with elevated BP.  This was previously only associated with elevated BP, however, he now reports chest tightness occasionally with exertion. Additionally, he notes generalized fatigue. He is concerned that this could be a symptom coming from his heart. Through shared decision making, will pursue coronary CT angiogram for further restratification. Reviewed ED precautions. Continue amlodipine, carvedilol, losartan, isosorbide, Lipitor.  3. Hyperlipidemia: LDL was 118 in 02/2021. Monitored and managed per PCP. Continue Lipitor.    4. PAD: S/p multiple interventions, s/p L AKA in 11/2020,  follows with vascular surgery.  Continue Plavix, Lipitor.   5. Tobacco use: He states he has quit smoking.  Encouraged ongoing cessation.   6. Disposition: Follow-up in 2 months.  Lenna Sciara, NP 08/20/2021, 4:40 PM

## 2021-08-20 NOTE — Patient Instructions (Addendum)
Medication Instructions:  Metoprolol 100 mg take 1 tab 2 hours prior to scan.  *If you need a refill on your cardiac medications before your next appointment, please call your pharmacy*   Lab Work: Your physician recommends that you complete labs 1 week before procedure BMET  If you have labs (blood work) drawn today and your tests are completely normal, you will receive your results only by: MyChart Message (if you have MyChart) OR A paper copy in the mail If you have any lab test that is abnormal or we need to change your treatment, we will call you to review the results.   Testing/Procedures:   Your cardiac CT will be scheduled at one of the below locations:   [facility] [address] [place], [place] [address] [phone]  OR  [facility] [address] [place], [place] [address] [phone]  If scheduled at [facility], please arrive at the [facility] (Entrance C2) of [facility] 30 minutes prior to test start time. You can use the FREE valet parking offered at entrance C (encouraged to control the heart rate for the test)  Proceed to the [facility] Radiology Department (first floor) to check-in and test prep.  All radiology patients and guests should use entrance C2 at [facility], accessed from [facility], even though the hospital's physical address listed is [address].    If scheduled at [facility], please arrive 15 mins early for check-in and test prep.  Please follow these instructions carefully (unless otherwise directed):  Hold all erectile dysfunction medications at least 3 days (72 hrs) prior to test.  On the Night Before the Test: Be sure to Drink plenty of water. Do not consume any caffeinated/decaffeinated beverages or chocolate 12 hours prior to your test. Do not take any antihistamines 12  hours prior to your test. If the patient has contrast allergy: Patient will need a prescription for Prednisone and very clear instructions (as follows): Prednisone 50 mg - take 13 hours prior to test Take another Prednisone 50 mg 7 hours prior to test Take another Prednisone 50 mg 1 hour prior to test Take Benadryl 50 mg 1 hour prior to test Patient must complete all four doses of above prophylactic medications. Patient will need a ride after test due to Benadryl.  On the Day of the Test: Drink plenty of water until 1 hour prior to the test. Do not eat any food 4 hours prior to the test. You may take your regular medications prior to the test.  Take metoprolol (Lopressor) two hours prior to test. HOLD Furosemide/Hydrochlorothiazide morning of the test. FEMALES- please wear underwire-free bra if available, avoid dresses & tight clothing   *For Clinical Staff only. Please instruct patient the following:* Heart Rate Medication Recommendations for Cardiac CT  Resting HR < 50 bpm  No medication  Resting HR 50-60 bpm and BP >110/50 mmHG   Consider Metoprolol tartrate 25 mg PO 90-120 min prior to scan  Resting HR 60-65 bpm and BP >110/50 mmHG  Metoprolol tartrate 50 mg PO 90-120 minutes prior to scan   Resting HR > 65 bpm and BP >110/50 mmHG  Metoprolol tartrate 100 mg PO 90-120 minutes prior to scan  Consider Ivabradine 10-15 mg PO or a calcium channel blocker for resting HR >60 bpm and contraindication to metoprolol tartrate  Consider Ivabradine 10-15 mg PO in combination with metoprolol  tartrate for HR >80 bpm         After the Test: Drink plenty of water. After receiving IV contrast, you may experience a mild flushed feeling. This is normal. On occasion, you may experience a mild rash up to 24 hours after the test. This is not dangerous. If this occurs, you can take Benadryl 25 mg and increase your fluid intake. If you experience trouble breathing, this can be serious. If it is  severe call 911 IMMEDIATELY. If it is mild, please call our office. If you take any of these medications: Glipizide/Metformin, Avandament, Glucavance, please do not take 48 hours after completing test unless otherwise instructed.  We will call to schedule your test 2-4 weeks out understanding that some insurance companies will need an authorization prior to the service being performed.   For non-scheduling related questions, please contact the cardiac imaging nurse navigator should you have any questions/concerns: Rockwell Alexandria, Cardiac Imaging Nurse Navigator Larey Brick, Cardiac Imaging Nurse Navigator [facility] Direct Office Dial: [phone]   For scheduling needs, including cancellations and rescheduling, please call [place], [phone].    Follow-Up: At [facility], you and your health needs are our priority.  As part of our continuing mission to provide you with exceptional heart care, we have created designated Provider Care Teams.  These Care Teams include your primary Cardiologist (physician) and Advanced Practice Providers (APPs -  Physician Assistants and Nurse Practitioners) who all work together to provide you with the care you need, when you need it.  We recommend signing up for the patient portal called "MyChart".  Sign up information is provided on this After Visit Summary.  MyChart is used to connect with patients for Virtual Visits (Telemedicine).  Patients are able to view lab/test results, encounter notes, upcoming appointments, etc.  Non-urgent messages can be sent to your provider as well.   To learn more about what you can do with MyChart, go to [email or web address].    Your next appointment:   2 month(s)  The format for your next appointment:   In Person  Provider:   Bernadene Person, NP        Other Instructions Monitor blood pressure. Report systolic blood pressure (top number) consistently greater than 130 or less than  110.     Important Information About Sugar

## 2021-08-22 ENCOUNTER — Encounter (HOSPITAL_COMMUNITY): Payer: Self-pay | Admitting: *Deleted

## 2021-08-22 ENCOUNTER — Emergency Department (HOSPITAL_COMMUNITY): Payer: Medicaid Other

## 2021-08-22 ENCOUNTER — Emergency Department (HOSPITAL_COMMUNITY)
Admission: EM | Admit: 2021-08-22 | Discharge: 2021-08-22 | Disposition: A | Discharge status: Home / Self Care | Payer: Medicaid Other | Attending: Emergency Medicine | Admitting: Emergency Medicine

## 2021-08-22 ENCOUNTER — Other Ambulatory Visit: Payer: Self-pay

## 2021-08-22 DIAGNOSIS — R072 Precordial pain: Secondary | ICD-10-CM | POA: Diagnosis present

## 2021-08-22 DIAGNOSIS — Z79899 Other long term (current) drug therapy: Secondary | ICD-10-CM | POA: Insufficient documentation

## 2021-08-22 DIAGNOSIS — I10 Essential (primary) hypertension: Secondary | ICD-10-CM | POA: Diagnosis not present

## 2021-08-22 DIAGNOSIS — Z7902 Long term (current) use of antithrombotics/antiplatelets: Secondary | ICD-10-CM | POA: Diagnosis not present

## 2021-08-22 LAB — CBC
HCT: 37.2 % — ABNORMAL LOW (ref 39.0–52.0)
Hemoglobin: 12 g/dL — ABNORMAL LOW (ref 13.0–17.0)
MCH: 22.7 pg — ABNORMAL LOW (ref 26.0–34.0)
MCHC: 32.3 g/dL (ref 30.0–36.0)
MCV: 70.5 fL — ABNORMAL LOW (ref 80.0–100.0)
Platelets: 358 10*3/uL (ref 150–400)
RBC: 5.28 MIL/uL (ref 4.22–5.81)
RDW: 18.8 % — ABNORMAL HIGH (ref 11.5–15.5)
WBC: 5.8 10*3/uL (ref 4.0–10.5)
nRBC: 0 % (ref 0.0–0.2)

## 2021-08-22 LAB — BASIC METABOLIC PANEL
Anion gap: 12 (ref 5–15)
BUN: 10 mg/dL (ref 8–23)
CO2: 23 mmol/L (ref 22–32)
Calcium: 9.1 mg/dL (ref 8.9–10.3)
Chloride: 105 mmol/L (ref 98–111)
Creatinine, Ser: 1.02 mg/dL (ref 0.61–1.24)
GFR, Estimated: >60 mL/min (ref >60)
Glucose, Bld: 110 mg/dL — ABNORMAL HIGH (ref 70–99)
Potassium: 4.1 mmol/L (ref 3.5–5.1)
Sodium: 140 mmol/L (ref 135–145)

## 2021-08-22 LAB — TROPONIN I (HIGH SENSITIVITY): Troponin I (High Sensitivity): 5 ng/L (ref <18)

## 2021-08-22 NOTE — Discharge Instructions (Signed)

## 2021-08-22 NOTE — ED Notes (Signed)
Pt to xray

## 2021-08-22 NOTE — ED Provider Notes (Signed)
[facility] EMERGENCY DEPARTMENT Provider Note   CSN: [id number] Arrival date & time: 08/22/21  0405     History  Chief Complaint  Patient presents with   Chest Pain    Benjamin [name] is a 61 y.o. male.  The history is provided by the patient.  Chest Pain Pain location:  Substernal area Pain quality comment:  Tightness Pain radiates to:  Does not radiate Pain severity:  Moderate Onset quality:  Sudden Duration:  30 minutes Timing:  Constant Progression:  Resolved Chronicity:  Recurrent Relieved by:  Nothing Worsened by:  Nothing Associated symptoms: no abdominal pain, no diaphoresis, no shortness of breath and no vomiting   Risk factors: hypertension and male sex    Patient with extensive history including hypertension, peripheral vascular disease presents with chest pain. Patient reports that yesterday around 2 PM he began having chest tightness and feeling symptoms of blurred vision.  No fevers or vomiting.  No shortness of breath or diaphoresis.  It lasted approximately 30 minutes and resolved spontaneously.  His pain is essentially gone at this time.  He recalls the last time he had this pain was about a month ago   Patient reports he is no longer smoking Home Medications Prior to Admission medications   Medication Sig Start Date End Date Taking? Authorizing Provider  acetaminophen (TYLENOL) 325 MG tablet Take 1-2 tablets (325-650 mg total) by mouth every 4 (four) hours as needed for mild pain. 12/25/20   Love, Evlyn Kanner, PA-C  albuterol (VENTOLIN HFA) 108 (90 Base) MCG/ACT inhaler Inhale 1-2 puffs into the lungs every 6 (six) hours as needed for wheezing or shortness of breath. 06/12/21   Tanda Rockers A, DO  amLODipine (NORVASC) 2.5 MG tablet Take 1 tablet (2.5 mg total) by mouth daily. 08/19/21   Elson Areas, PA-C  atorvastatin (LIPITOR) 20 MG tablet Take 1 tablet (20 mg total) by mouth at bedtime. Patient taking differently: Take 20 mg by mouth  daily. 06/28/21   Arnette Felts, FNP  carvedilol (COREG) 6.25 MG tablet Take 1 tablet (6.25 mg total) by mouth 2 (two) times daily with a meal. 07/30/21   Monge, Petra Kuba, NP  cloNIDine (CATAPRES - DOSED IN MG/24 HR) 0.1 mg/24hr patch Place 1 patch (0.1 mg total) onto the skin once a week. 07/05/21   Raulkar, Drema Pry, MD  cloNIDine (CATAPRES - DOSED IN MG/24 HR) 0.1 mg/24hr patch Place 1 patch (0.1 mg total) onto the skin once every 7 (seven) days. 07/28/21     clopidogrel (PLAVIX) 75 MG tablet Take 1 tablet (75 mg total) by mouth daily. 05/11/21     clopidogrel (PLAVIX) 75 MG tablet Take 1 tablet by mouth daily. 10/29/20   [provider]  diphenhydrAMINE (BENADRYL) 25 MG tablet Take 25 mg by mouth every 6 (six) hours as needed for allergies.    [provider]  hydrOXYzine (ATARAX) 25 MG tablet Take 1 tablet (25 mg total) by mouth at bedtime as needed (for sleep.). 08/14/21   Carlisle Beers, FNP  isosorbide mononitrate (IMDUR) 30 MG 24 hr tablet Take 1 tablet (30 mg total) by mouth once daily. 07/14/21     losartan (COZAAR) 50 MG tablet Take 1 tablet (50 mg total) by mouth in the morning and at bedtime. 07/05/21   Raulkar, Drema Pry, MD  methocarbamol (ROBAXIN) 500 MG tablet Take 1 tablet (500 mg total) by mouth every 6 (six) hours as needed for muscle spasms. 12/25/20  Love, Evlyn Kanner, PA-C  metoprolol tartrate (LOPRESSOR) 100 MG tablet Take 1 tablet 2 hours prior to procedure. 08/20/21   Joylene Grapes, NP  nitroGLYCERIN (NITROSTAT) 0.4 MG SL tablet Place 1 tablet (0.4 mg total) under the tongue every 5 (five) minutes as needed for chest pain. 08/20/21   Joylene Grapes, NP  senna (SENOKOT) 8.6 MG TABS tablet Take 1 tablet (8.6 mg total) by mouth 2 (two) times daily. 12/25/20   Love, Evlyn Kanner, PA-C      Allergies    Gabapentin, Tetanus toxoids, Aspirin, and Garlic    Review of Systems   Review of Systems  Constitutional:  Negative for diaphoresis.  Respiratory:  Negative for  shortness of breath.   Cardiovascular:  Positive for chest pain.  Gastrointestinal:  Negative for abdominal pain and vomiting.    Physical Exam Updated Vital Signs BP (!) 139/97   Pulse 70   Temp 98.8 F (37.1 C)   Resp 16   Ht 1.727 m (5\' 8" )   Wt 72.8 kg   SpO2 96%   BMI 24.40 kg/m  Physical Exam CONSTITUTIONAL: Elderly, no acute distress HEAD: Normocephalic/atraumatic EYES: EOMI/PERRL ENMT: Mucous membranes moist NECK: supple no meningeal signs SPINE/BACK:entire spine nontender CV: S1/S2 noted, distant heart sound LUNGS: Lungs are clear to auscultation bilaterally, no apparent distress ABDOMEN: soft, nontender NEURO: Pt is awake/alert/appropriate, moves all extremitiesx4.  No facial droop.   EXTREMITIES: Left AKA noted SKIN: warm, color normal PSYCH: no abnormalities of mood noted, alert and oriented to situation  ED Results / Procedures / Treatments   Labs (all labs ordered are listed, but only abnormal results are displayed) Labs Reviewed  BASIC METABOLIC PANEL - Abnormal; Notable for the following components:      Result Value   Glucose, Bld 110 (*)    All other components within normal limits  CBC - Abnormal; Notable for the following components:   Hemoglobin 12.0 (*)    HCT 37.2 (*)    MCV 70.5 (*)    MCH 22.7 (*)    RDW 18.8 (*)    All other components within normal limits  TROPONIN I (HIGH SENSITIVITY)    EKG  ED ECG REPORT   Date: 08/22/2021 0414am  Rate: 70  Rhythm: normal sinus rhythm  QRS Axis: normal  Intervals: normal  ST/T Wave abnormalities: nonspecific ST changes  Conduction Disutrbances:none  Narrative Interpretation:   Old EKG Reviewed: unchanged  I have personally reviewed the EKG tracing and agree with the computerized printout as noted.   Radiology DG Chest 2 View  Result Date: 08/22/2021 CLINICAL DATA:  61 year old male history of chest pain for the past 2 weeks. EXAM: CHEST - 2 VIEW COMPARISON:  Chest x-ray 08/10/2021.  FINDINGS: Lung volumes are normal. No consolidative airspace disease. No pleural effusions. No pneumothorax. No pulmonary nodule or mass noted. Pulmonary vasculature and the cardiomediastinal silhouette are within normal limits. Atherosclerosis in the thoracic aorta. IMPRESSION: 1.  No radiographic evidence of acute cardiopulmonary disease. 2. Aortic atherosclerosis. Electronically Signed   By: 08/12/2021 M.D.   On: 08/22/2021 06:53    Procedures Procedures    Medications Ordered in ED Medications - No data to display  ED Course/ Medical Decision Making/ A&P Clinical Course as of 08/22/21 0700  Sun Aug 22, 2021  Aug 24, 2021 Patient presents for his 32nd ER visit in 6 months.  Patient presents for frequent episodes of chest pain and hypertension.  He reports he is now med compliant  and takes amlodipine every day and carvedilol twice a day.  He also reports compliance with his Plavix. [DW]  [phone] Patient was just seen by cardiology on June 30.  Plans are for coronary CT later this month.  EKG is abnormal at baseline but no acute changes.  Initial troponin is negative and his last episode of chest pain was well over 12 hours ago.  He is in no acute distress at this time.  I have low suspicion for ACS/PE/dissection at this time [DW]  0700 Work-up in the emergency room has been unremarkable.  Patient is in no distress.  No further work-up indicated at this time [DW]    Clinical Course User Index [DW] Zadie Rhine, MD                           Medical Decision Making Amount and/or Complexity of Data Reviewed Labs: ordered. Radiology: ordered.   This patient presents to the ED for concern of chest pain, this involves an extensive number of treatment options, and is a complaint that carries with it a high risk of complications and morbidity.  The differential diagnosis includes but is not limited to acute coronary syndrome, aortic dissection, pulmonary embolism, pericarditis, pneumothorax,  pneumonia, myocarditis, pleurisy, esophageal rupture    Comorbidities that complicate the patient evaluation: Patient's presentation is complicated by their history of peripheral vascular disease, hypertension  Social Determinants of Health: Patient's  multiple ER visits   increases the complexity of managing their presentation  Additional history obtained: Records reviewed  cardiology notes reviewed  Lab Tests: I Ordered, and personally interpreted labs.  The pertinent results include: Labs are overall unremarkable  Imaging Studies ordered: I ordered imaging studies including X-ray chest   I independently visualized and interpreted imaging which showed no acute findings I agree with the radiologist interpretation  Cardiac Monitoring: The patient was maintained on a cardiac monitor.  I personally viewed and interpreted the cardiac monitor which showed an underlying rhythm of:  sinus rhythm  Test Considered: Patient with extensive evaluations previously, therefore no further work-up indicated  Reevaluation: After the interventions noted above, I reevaluated the patient and found that they have :improved  Complexity of problems addressed: Patient's presentation is most consistent with  acute presentation with potential threat to life or bodily function  Disposition: After consideration of the diagnostic results and the patient's response to treatment,  I feel that the patent would benefit from discharge   .           Final Clinical Impression(s) / ED Diagnoses Final diagnoses:  Precordial pain    Rx / DC Orders ED Discharge Orders     None         Zadie Rhine, MD 08/22/21 [phone]

## 2021-08-22 NOTE — ED Triage Notes (Signed)
The pt is c.o chest pain with high bp for 2 weeks

## 2021-09-13 ENCOUNTER — Telehealth (HOSPITAL_COMMUNITY): Payer: Self-pay | Admitting: Emergency Medicine

## 2021-09-13 NOTE — Telephone Encounter (Signed)
Reaching out to patient to offer assistance regarding upcoming cardiac imaging study; pt verbalizes understanding of appt date/time, parking situation and where to check in, pre-test NPO status and medications ordered, and verified current allergies; name and call back number provided for further questions should they arise Rockwell Alexandria RN Navigator Cardiac Imaging [facility] [phone] office [phone] cell  Denies iv issues Arrival 1200 Aware of nitro 100mg  metoprolol tartrate

## 2021-09-14 ENCOUNTER — Ambulatory Visit (HOSPITAL_COMMUNITY)
Admission: RE | Admit: 2021-09-14 | Discharge: 2021-09-14 | Disposition: A | Discharge status: Home / Self Care | Payer: Medicaid Other | Source: Ambulatory Visit | Attending: Nurse Practitioner | Admitting: Nurse Practitioner

## 2021-09-14 ENCOUNTER — Ambulatory Visit: Payer: Medicaid Other | Admitting: Physical Medicine and Rehabilitation

## 2021-09-14 ENCOUNTER — Other Ambulatory Visit: Payer: Self-pay

## 2021-09-14 DIAGNOSIS — R931 Abnormal findings on diagnostic imaging of heart and coronary circulation: Secondary | ICD-10-CM | POA: Insufficient documentation

## 2021-09-14 DIAGNOSIS — R0789 Other chest pain: Secondary | ICD-10-CM | POA: Insufficient documentation

## 2021-09-14 MED ORDER — NITROGLYCERIN 0.4 MG SL SUBL
0.8000 mg | SUBLINGUAL_TABLET | Freq: Once | SUBLINGUAL | Status: AC
  Administered 2021-09-14: 0.8 mg via SUBLINGUAL

## 2021-09-14 MED ORDER — IOHEXOL 350 MG/ML SOLN
100.0000 mL | Freq: Once | INTRAVENOUS | Status: AC | PRN
  Administered 2021-09-14: 100 mL via INTRAVENOUS

## 2021-09-14 MED ORDER — NITROGLYCERIN 0.4 MG SL SUBL
SUBLINGUAL_TABLET | SUBLINGUAL | Status: AC
  Filled 2021-09-14: qty 2

## 2021-09-15 ENCOUNTER — Other Ambulatory Visit (HOSPITAL_COMMUNITY): Payer: Self-pay | Admitting: Emergency Medicine

## 2021-09-15 ENCOUNTER — Ambulatory Visit (HOSPITAL_BASED_OUTPATIENT_CLINIC_OR_DEPARTMENT_OTHER)
Admission: RE | Admit: 2021-09-15 | Discharge: 2021-09-15 | Disposition: A | Discharge status: Home / Self Care | Payer: Medicaid Other | Source: Ambulatory Visit | Attending: Cardiology | Admitting: Cardiology

## 2021-09-15 ENCOUNTER — Ambulatory Visit (HOSPITAL_COMMUNITY)
Admission: RE | Admit: 2021-09-15 | Discharge: 2021-09-15 | Disposition: A | Discharge status: Home / Self Care | Payer: Medicaid Other | Source: Ambulatory Visit | Attending: Cardiology | Admitting: Cardiology

## 2021-09-15 DIAGNOSIS — R931 Abnormal findings on diagnostic imaging of heart and coronary circulation: Secondary | ICD-10-CM | POA: Diagnosis not present

## 2021-10-04 ENCOUNTER — Other Ambulatory Visit: Payer: Self-pay

## 2021-10-04 ENCOUNTER — Encounter
Payer: Medicaid Other | Attending: Physical Medicine and Rehabilitation | Admitting: Physical Medicine and Rehabilitation

## 2021-10-04 VITALS — BP 118/71 | HR 64 | Ht 68.0 in | Wt 163.2 lb

## 2021-10-04 DIAGNOSIS — G4701 Insomnia due to medical condition: Secondary | ICD-10-CM | POA: Insufficient documentation

## 2021-10-04 DIAGNOSIS — Z89619 Acquired absence of unspecified leg above knee: Secondary | ICD-10-CM | POA: Insufficient documentation

## 2021-10-04 DIAGNOSIS — H9319 Tinnitus, unspecified ear: Secondary | ICD-10-CM | POA: Insufficient documentation

## 2021-10-04 MED ORDER — CARVEDILOL 6.25 MG PO TABS
6.2500 mg | ORAL_TABLET | Freq: Two times a day (BID) | ORAL | 3 refills | Status: DC
  Filled 2021-10-04: qty 120, 60d supply, fill #0

## 2021-10-04 NOTE — Patient Instructions (Signed)
Insomnia: -Try to go outside near sunrise -Get exercise during the day.  -Turn off all devices an hour before bedtime.  -Teas that can benefit: chamomile, valerian root, Brahmi (Bacopa) -Can consider over the counter melatonin, magnesium, and/or L-theanine. Melatonin is an anti-oxidant with multiple health benefits. Magnesium is involved in greater than 300 enzymatic reactions in the body and most of [name] are deficient as our soil is often depleted. There are 7 different types of magnesium- Bioptemizer's is a supplement with all 7 types, and each has unique benefits. Magnesium can also help with constipation and anxiety.  -Pistachios naturally increase the production of melatonin -Cozy Earth bamboo bed sheets are free from toxic chemicals.  -Tart cherry juice or a tart cherry supplement can improve sleep and soreness post-workout    Foods that may reduce pain: 1) Ginger (especially studied for arthritis)- reduce leukotriene production to decrease inflammation 2) Blueberries- high in phytonutrients that decrease inflammation 3) Salmon- marine omega-3s reduce joint swelling and pain 4) Pumpkin seeds- reduce inflammation 5) dark chocolate- reduces inflammation 6) turmeric- reduces inflammation 7) tart cherries - reduce pain and stiffness 8) extra virgin olive oil - its compound olecanthal helps to block prostaglandins  9) chili peppers- can be eaten or applied topically via capsaicin 10) mint- helpful for headache, muscle aches, joint pain, and itching 11) garlic- reduces inflammation  Link to further information on diet for chronic pain: http://www.bray.com/   Turmeric to reduce inflammation--can be used in cooking or taken as a supplement.  Benefits of turmeric:  -Highly anti-inflammatory  -Increases antioxidants  -Improves memory, attention, brain disease  -Lowers risk of heart disease  -May help prevent  cancer  -Decreases pain  -Alleviates depression  -Delays aging and decreases risk of chronic disease  -Consume with black pepper to increase absorption    Turmeric Milk Recipe:  1 cup milk  1 tsp turmeric  1 tsp cinnamon  1 tsp grated ginger (optional)  Black pepper (boosts the anti-inflammatory properties of turmeric).  1 tsp honey

## 2021-10-04 NOTE — Progress Notes (Signed)
Subjective:    Patient ID: Benjamin [name], male    DOB: 1960/04/04, 61 y.o.   MRN: [id number]  HPI Benjamin [name] is a 61 year old man who presents for f/u of left AKA.  1) Tinnitus -occurs at 5am when he takes Losartan  -he is getting all his meds inpackaged form.   2) Left AKA -sometimes hurts when he is laying down -he gets it to the rest and then he feels alright -he is not taking gabapentin -got prosthesis but it is too long and he spoke to [facility].  -going to [employer]  3) Insomnia: -he takes a benadryl sometime  Pain Inventory Average Pain 0 Pain Right Now 0 My pain is  na  In the last 24 hours, has pain interfered with the following? General activity 0 Relation with others 0 Enjoyment of life 0 What TIME of day is your pain at its worst? night Sleep (in general) Fair  Pain is worse with:  na Pain improves with:  na Relief from Meds:  na  Family History  Problem Relation Age of Onset   Arthritis Mother    COPD Father    Stroke Father    Multiple sclerosis Sister    Social History   Socioeconomic History   Marital status: Single    Spouse name: Not on file   Number of children: Not on file   Years of education: 12   Highest education level: Not on file  Occupational History   Not on file  Tobacco Use   Smoking status: Former    Packs/day: 1.50    Years: 29.00    Total pack years: 43.50    Types: Cigarettes    Quit date: 10/22/2020    Years since quitting: 0.9   Smokeless tobacco: Never   Tobacco comments:    Quit smoking September 2022.  Using Nicoderm patch currently.  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   He lives with his sister and her son.   Walks every day to work at least 15+ minutes.   Social Determinants of Health    Financial Resource Strain: Medium Risk (02/18/2021)   Overall Financial Resource Strain (CARDIA)    Difficulty of Paying Living Expenses: Somewhat hard  Food Insecurity: No Food Insecurity (02/18/2021)   Hunger Vital Sign    Worried About Running Out of Food in the Last Year: Never true    Ran Out of Food in the Last Year: Never true  Transportation Needs: No Transportation Needs (02/18/2021)   PRAPARE - Administrator, Civil Service (Medical): No    Lack of Transportation (Non-Medical): No  Physical Activity: Inactive (02/18/2021)   Exercise Vital Sign    Days of Exercise per Week: 0 days    Minutes of Exercise per Session: 0 min  Stress: Stress Concern Present (02/18/2021)   [employer] of Occupational Health - Occupational Stress Questionnaire    Feeling of Stress : To some extent  Social Connections: Unknown (02/18/2021)   Social Connection and Isolation Panel [NHANES]    Frequency of Communication with Friends and Family: More than three times a week    Frequency of Social Gatherings with Friends and Family: More  than three times a week    Attends Religious Services: Not on file    Active Member of Clubs or Organizations: Not on file    Attends Club or Organization Meetings: Not on file    Marital Status: Never married   Past Surgical History:  Procedure Laterality Date   ABDOMINAL AORTOGRAM W/LOWER EXTREMITY N/A 07/12/2016   Procedure: Abdominal Aortogram w/Lower Extremity;  Surgeon: Nada Libman, MD;  [facility];  Service: Cardiovascular;  Laterality: N/A;  Bilateral extermity: Patent Renal As. No sig Dz in infrarenal Abd Aorta. Normal Bilat Iliac arteries. R SFA is 100% @ origin - recon in AK-Pop A. R PT A patent. L CFA occluded. L PFA recon @ origin, L SFA occluded w/ recon in AK Pop A.   ABDOMINAL AORTOGRAM W/LOWER EXTREMITY N/A 10/15/2019   Procedure: ABDOMINAL AORTOGRAM W/LOWER EXTREMITY;  Surgeon: Nada Libman, MD;  [facility];  Service: Cardiovascular;  Laterality: N/A;   AMPUTATION Left 10/04/2020   Procedure: LEFT ABOVE KNEE AMPUTATION;  Surgeon: Nada Libman, MD;  Location: [facility] OR;  Service: Vascular;  Laterality: Left;   AMPUTATION Left 12/08/2020   Procedure: RIGHT ABOVE KNEE AMPUTATION REVISION;  Surgeon: Chuck Hint, MD;  Location: [facility] OR;  Service: Vascular;  Laterality: Left;   AORTA - BILATERAL FEMORAL ARTERY BYPASS GRAFT N/A 05/08/2020   Procedure: AORTA BIFEMORAL BYPASS GRAFT;  Surgeon: Nada Libman, MD;  [facility];  Service: Vascular;  Laterality: N/A;   COLONOSCOPY  06/2016   never   ENDARTERECTOMY FEMORAL Left 11/14/2019   Procedure: Left groin exploration, Redo left femoral artery exposure;  Surgeon: Nada Libman, MD;  [facility];  Service: Vascular;  Laterality: Left;   FEMORAL-FEMORAL BYPASS GRAFT N/A 11/13/2019   Procedure: BYPASS GRAFT FEMORAL-FEMORAL ARTERY RIGHT TO LEFT USING HEMASHIELD GOLD GRAFT 25mm x 30cm;  Surgeon: Nada Libman, MD;  Location: [facility] OR;  Service: Vascular;  Laterality: N/A;   FEMORAL-POPLITEAL BYPASS GRAFT Left 11/13/2019   Procedure: LEFT FEMORAL BELOW KNEE-POPLITEAL ARTERY USING NON-REVERSED GREATER SAPHENOUS VEIN;  Surgeon: Nada Libman, MD;  [facility];  Service: Vascular;  Laterality: Left;   FEMORAL-POPLITEAL BYPASS GRAFT Left 05/08/2020   Procedure: LEFT REDO BYPASS GRAFT COMMON FEMORAL- BELOW KNEE POPLITEAL ARTERY USING PROPATEN GRAFT;  Surgeon: Nada Libman, MD;  [facility];  Service: Vascular;  Laterality: Left;   I & D EXTREMITY Left 09/07/2020   Procedure: IRRIGATION AND DEBRIDEMENT LEFT LEG WITH EXCISION OF LEFT LEG DISTAL BYPASS GRAFT;  Surgeon: Cephus Shelling, MD;  [facility];  Service: Vascular;  Laterality: Left;   LOWER EXTREMITY ANGIOGRAM Left 11/14/2019   Procedure: LEFT LOWER EXTREMITY ANGIOGRAM, BYPASS GRAFT ANGIOPLASTY;  Surgeon: Nada Libman, MD;  [facility];   Service: Vascular;  Laterality: Left;   PERIPHERAL VASCULAR INTERVENTION  10/15/2019   Procedure: PERIPHERAL VASCULAR INTERVENTION;  Surgeon: Nada Libman, MD;  [facility];  Service: Cardiovascular;;  Rt Iliac   REMOVAL OF GRAFT Left 10/04/2020   Procedure: REMOVAL OF LEFT FEMORAL TO POPITEAL BYPASS GRAFT;  Surgeon: Nada Libman, MD;  [facility];  Service: Vascular;  Laterality: Left;   Past Surgical History:  Procedure Laterality Date   ABDOMINAL AORTOGRAM W/LOWER EXTREMITY N/A 07/12/2016   Procedure: Abdominal Aortogram w/Lower Extremity;  Surgeon: Nada Libman, MD;  [facility];  Service: Cardiovascular;  Laterality: N/A;  Bilateral extermity:  Patent Renal As. No sig Dz in infrarenal Abd Aorta. Normal Bilat Iliac arteries. R SFA is 100% @ origin - recon in AK-Pop A. R PT A patent. L CFA occluded. L PFA recon @ origin, L SFA occluded w/ recon in AK Pop A.   ABDOMINAL AORTOGRAM W/LOWER EXTREMITY N/A 10/15/2019   Procedure: ABDOMINAL AORTOGRAM W/LOWER EXTREMITY;  Surgeon: Serafina Mitchell, MD;  Location: [place] [facility];  Service: Cardiovascular;  Laterality: N/A;   AMPUTATION Left 10/04/2020   Procedure: LEFT ABOVE KNEE AMPUTATION;  Surgeon: Serafina Mitchell, MD;  Location: [place];  Service: Vascular;  Laterality: Left;   AMPUTATION Left 12/08/2020   Procedure: RIGHT ABOVE KNEE AMPUTATION REVISION;  Surgeon: Angelia Mould, MD;  Location: [facility] OR;  Service: Vascular;  Laterality: Left;   AORTA - BILATERAL FEMORAL ARTERY BYPASS GRAFT N/A 05/08/2020   Procedure: AORTA BIFEMORAL BYPASS GRAFT;  Surgeon: Serafina Mitchell, MD;  [facility];  Service: Vascular;  Laterality: N/A;   COLONOSCOPY  06/2016   never   ENDARTERECTOMY FEMORAL Left 11/14/2019   Procedure: Left groin exploration, Redo left femoral artery exposure;  Surgeon: Serafina Mitchell, MD;  [facility];  Service: Vascular;  Laterality: Left;   FEMORAL-FEMORAL BYPASS GRAFT  N/A 11/13/2019   Procedure: BYPASS GRAFT FEMORAL-FEMORAL ARTERY RIGHT TO LEFT USING HEMASHIELD GOLD GRAFT 66mm x 30cm;  Surgeon: Serafina Mitchell, MD;  Location: [facility] OR;  Service: Vascular;  Laterality: N/A;   FEMORAL-POPLITEAL BYPASS GRAFT Left 11/13/2019   Procedure: LEFT FEMORAL BELOW KNEE-POPLITEAL ARTERY USING NON-REVERSED GREATER SAPHENOUS VEIN;  Surgeon: Serafina Mitchell, MD;  [facility];  Service: Vascular;  Laterality: Left;   FEMORAL-POPLITEAL BYPASS GRAFT Left 05/08/2020   Procedure: LEFT REDO BYPASS GRAFT COMMON FEMORAL- BELOW KNEE POPLITEAL ARTERY USING PROPATEN GRAFT;  Surgeon: Serafina Mitchell, MD;  [facility];  Service: Vascular;  Laterality: Left;   I & D EXTREMITY Left 09/07/2020   Procedure: IRRIGATION AND DEBRIDEMENT LEFT LEG WITH EXCISION OF LEFT LEG DISTAL BYPASS GRAFT;  Surgeon: Marty Heck, MD;  Location: [place];  Service: Vascular;  Laterality: Left;   LOWER EXTREMITY ANGIOGRAM Left 11/14/2019   Procedure: LEFT LOWER EXTREMITY ANGIOGRAM, BYPASS GRAFT ANGIOPLASTY;  Surgeon: Serafina Mitchell, MD;  [facility];  Service: Vascular;  Laterality: Left;   PERIPHERAL VASCULAR INTERVENTION  10/15/2019   Procedure: PERIPHERAL VASCULAR INTERVENTION;  Surgeon: Serafina Mitchell, MD;  Location: [place] [facility];  Service: Cardiovascular;;  Rt Iliac   REMOVAL OF GRAFT Left 10/04/2020   Procedure: REMOVAL OF LEFT FEMORAL TO POPITEAL BYPASS GRAFT;  Surgeon: Serafina Mitchell, MD;  [facility];  Service: Vascular;  Laterality: Left;   Past Medical History:  Diagnosis Date   Back pain    Diabetes mellitus without complication ([place])    Heavy smoker    Hyperlipidemia LDL goal <70    Hypertension    Peripheral arterial occlusive disease ([place]) 06/2013   Bilateral femoral artery disease   BP 118/71   Pulse 64   Ht 5\' 8"  (1.727 m)   Wt 163 lb 3.2 oz (74 kg)   SpO2 92%   BMI 24.81 kg/m   Opioid Risk Score:   Fall Risk Score:  `1  Depression screen PHQ  2/9     03/16/2021    2:06 PM 02/18/2021   10:10 AM 07/22/2020   10:30 AM 07/01/2019    9:08 AM 05/15/2019    9:35 AM 01/20/2017  8:35 AM  Depression screen PHQ 2/9  Decreased Interest 2 1 0 1 1 0  Down, Depressed, Hopeless 0 1 0 0 1 0  PHQ - 2 Score 2 2 0 1 2 0  Altered sleeping 1   2 3    Tired, decreased energy 1   1 1    Change in appetite 0   0 2   Feeling bad or failure about yourself  1   0 2   Trouble concentrating 0   0 0   Moving slowly or fidgety/restless 0   0 0   Suicidal thoughts 0   0 0   PHQ-9 Score 5   4 10    Difficult doing work/chores Somewhat difficult   Not difficult at all Extremely dIfficult       Review of Systems  Musculoskeletal:        Stump  All other systems reviewed and are negative.     Objective:   Physical Exam Gen: no distress, normal appearing HEENT: oral mucosa pink and moist, NCAT Cardio: Reg rate Chest: normal effort, normal rate of breathing Abd: soft, non-distended Ext: no edema Psych: pleasant, normal affect Skin: intact Neuro: Alert and oriented x3 Musculoskeletal: Left AKA    Assessment & Plan:   1) Left AKA -encouraged follow-up with [facility] for new prosthesis  2) HTN: -commended on improved BP control  3) Tinnitus: -appears to be associated with blood pressure medication, discussed decreasing Coreg to 6.25mg  BID given improved BP control, sent new script  4) 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  -Discussed benefits of exercise in reducing pain. -Discussed following foods that may reduce pain: 1) Ginger (especially studied for arthritis)- reduce leukotriene production to decrease inflammation 2) Blueberries- high in phytonutrients that decrease inflammation 3) Salmon- marine omega-3s reduce joint swelling and pain 4) Pumpkin seeds- reduce inflammation 5) dark chocolate- reduces inflammation 6) turmeric- reduces inflammation 7) tart cherries -  reduce pain and stiffness 8) extra virgin olive oil - its compound olecanthal helps to block prostaglandins  9) chili peppers- can be eaten or applied topically via capsaicin 10) mint- helpful for headache, muscle aches, joint pain, and itching 11) garlic- reduces inflammation  Link to further information on diet for chronic pain: http://www.randall.[email or web address]    5) Insomnia: -Try to go outside near sunrise -Get exercise during the day.  -Turn off all devices an hour before bedtime.  -Teas that can benefit: chamomile, valerian root, Brahmi (Bacopa) -Can consider over the counter melatonin, magnesium, and/or L-theanine. Melatonin is an anti-oxidant with multiple health benefits. Magnesium is involved in greater than 300 enzymatic reactions in the body and most of [name] are deficient as our soil is often depleted. There are 7 different types of magnesium- Bioptemizer's is a supplement with all 7 types, and each has unique benefits. Magnesium can also help with constipation and anxiety.  -Pistachios naturally increase the production of melatonin -[employer] bamboo bed sheets are free from toxic chemicals.  -Tart cherry juice or a tart cherry supplement can improve sleep and soreness post-workout

## 2021-10-20 ENCOUNTER — Encounter: Payer: Self-pay | Admitting: Nurse Practitioner

## 2021-10-20 ENCOUNTER — Ambulatory Visit: Payer: Medicaid Other | Attending: Nurse Practitioner | Admitting: Nurse Practitioner

## 2021-10-20 VITALS — BP 127/73 | HR 66 | Ht 67.0 in | Wt 160.8 lb

## 2021-10-20 DIAGNOSIS — I739 Peripheral vascular disease, unspecified: Secondary | ICD-10-CM | POA: Diagnosis not present

## 2021-10-20 DIAGNOSIS — E785 Hyperlipidemia, unspecified: Secondary | ICD-10-CM

## 2021-10-20 DIAGNOSIS — I10 Essential (primary) hypertension: Secondary | ICD-10-CM | POA: Diagnosis not present

## 2021-10-20 DIAGNOSIS — I25118 Atherosclerotic heart disease of native coronary artery with other forms of angina pectoris: Secondary | ICD-10-CM

## 2021-10-20 DIAGNOSIS — Z720 Tobacco use: Secondary | ICD-10-CM

## 2021-10-20 NOTE — Progress Notes (Signed)
Office Visit    Patient Name: Benjamin [name] Date of Encounter: 10/20/2021  Primary Care Provider:  Levonne Lapping, NP Primary Cardiologist:  Bryan Lemma, MD  Chief Complaint    61 year old male with a history of chest pain, hypertension, hyperlipidemia, PAD s/p multiple interventions occluding L AKA in October 2022, prior tobacco use, and chronic back pain who presents for follow-up related to chest pain and hypertension.  Past Medical History    Past Medical History:  Diagnosis Date   Back pain    Diabetes mellitus without complication (HCC)    Heavy smoker    Hyperlipidemia LDL goal <70    Hypertension    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 No sig Dz in infrarenal Abd Aorta. Normal Bilat Iliac arteries. R SFA is 100% @ origin - recon in AK-Pop A. R PT A patent. L CFA occluded. L PFA recon @ origin, L SFA occluded w/ recon in AK Pop A.   ABDOMINAL AORTOGRAM W/LOWER EXTREMITY N/A 10/15/2019   Procedure: ABDOMINAL AORTOGRAM W/LOWER EXTREMITY;  Surgeon: Nada Libman, MD;  [facility];  Service: Cardiovascular;  Laterality: N/A;   AMPUTATION Left 10/04/2020   Procedure: LEFT ABOVE KNEE AMPUTATION;  Surgeon: Nada Libman, MD;  [facility] OR;  Service: Vascular;  Laterality: Left;   AMPUTATION Left 12/08/2020   Procedure: RIGHT ABOVE KNEE AMPUTATION REVISION;  Surgeon: Chuck Hint, MD;  Location: [facility] OR;  Service: Vascular;  Laterality: Left;   AORTA - BILATERAL FEMORAL ARTERY BYPASS GRAFT N/A 05/08/2020   Procedure: AORTA BIFEMORAL BYPASS GRAFT;  Surgeon: Nada Libman, MD;  [facility];   Service: Vascular;  Laterality: N/A;   COLONOSCOPY  06/2016   never   ENDARTERECTOMY FEMORAL Left 11/14/2019   Procedure: Left groin exploration, Redo left femoral artery exposure;  Surgeon: Nada Libman, MD;  [facility];  Service: Vascular;  Laterality: Left;   FEMORAL-FEMORAL BYPASS GRAFT N/A 11/13/2019   Procedure: BYPASS GRAFT FEMORAL-FEMORAL ARTERY RIGHT TO LEFT USING HEMASHIELD GOLD GRAFT 41mm x 30cm;  Surgeon: Nada Libman, MD;  Location: [facility] OR;  Service: Vascular;  Laterality: N/A;   FEMORAL-POPLITEAL BYPASS GRAFT Left 11/13/2019   Procedure: LEFT FEMORAL BELOW KNEE-POPLITEAL ARTERY USING NON-REVERSED GREATER SAPHENOUS VEIN;  Surgeon: Nada Libman, MD;  [facility];  Service: Vascular;  Laterality: Left;   FEMORAL-POPLITEAL BYPASS GRAFT Left 05/08/2020   Procedure: LEFT REDO BYPASS GRAFT COMMON FEMORAL- BELOW KNEE POPLITEAL ARTERY USING PROPATEN GRAFT;  Surgeon: Nada Libman, MD;  [facility];  Service: Vascular;  Laterality: Left;   I & D EXTREMITY Left 09/07/2020   Procedure: IRRIGATION AND DEBRIDEMENT LEFT LEG WITH EXCISION OF LEFT LEG DISTAL BYPASS GRAFT;  Surgeon: Cephus Shelling, MD;  [facility];  Service: Vascular;  Laterality: Left;   LOWER EXTREMITY ANGIOGRAM Left 11/14/2019   Procedure: LEFT LOWER EXTREMITY ANGIOGRAM, BYPASS GRAFT ANGIOPLASTY;  Surgeon: Nada Libman, MD;  [facility];  Service: Vascular;  Laterality: Left;   PERIPHERAL VASCULAR INTERVENTION  10/15/2019   Procedure: PERIPHERAL VASCULAR INTERVENTION;  Surgeon: Nada Libman, MD;  [facility];  Service:  Cardiovascular;;  Rt Iliac   REMOVAL OF GRAFT Left 10/04/2020   Procedure: REMOVAL OF LEFT FEMORAL TO POPITEAL BYPASS GRAFT;  Surgeon: Nada Libman, MD;  [facility];  Service: Vascular;  Laterality: Left;    Allergies  Allergies  Allergen Reactions   Gabapentin Other (See Comments)    Causes lethargy   Tetanus Toxoids Other (See  Comments)    Tinnitus started after he took this   Aspirin Nausea And Vomiting   Garlic Other (See Comments)    Sneezing     History of Present Illness    61 year old male with the above past medical history including chest pain, hypertension, hyperlipidemia, PAD s/p multiple interventions 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    He has a history of heavy tobacco use and PAD s/p multiple interventions, follows with vascular surgery.. Stress tress in 10/2019 showed showed no evidence of ischemia, low risk, EF 45-50%.  He has recently had multiple ED visits setting of chest pain, shortness of breath, hypertension, and ringing in his ears. Work-up has consistently been unremarkable.    He was last seen in the office on 08/20/2021 and had intermittently elevated BP in the setting of medication non-adherence, intermittent chest tightness at rest and with exertion and generalized fatigue.  Coronary CTA revealed calcium score of 844 (94th percentile), extensive plaque in the p-mid LAD, moderate stenosis in RCA, extensive left circumflex disease extending into the L PDA, FFR was inconclusive.  Should he have persistent symptoms, invasive evaluation was recommended.  Into the ED on 08/22/2021 with complaints of chest pain.  Labs were unremarkable, chest x-ray was unremarkable.  He presents today for follow-up. Since his last visit he has been stable from a cardiac standpoint.  He denies any recent chest pain and states he has been more active, exercising regularly at his gym.  BP has been well controlled.  He is not smoking and is taking his medication as prescribed.  Overall, he reports feeling well and denies any new concerns today.  Home Medications    Current Outpatient Medications  Medication Sig Dispense Refill   acetaminophen (TYLENOL) 325 MG tablet Take 1-2 tablets (325-650 mg total) by mouth every 4 (four) hours as needed for mild pain. 200 tablet 0   albuterol  (VENTOLIN HFA) 108 (90 Base) MCG/ACT inhaler Inhale 1-2 puffs into the lungs every 6 (six) hours as needed for wheezing or shortness of breath. 18 g 0   amLODipine (NORVASC) 2.5 MG tablet Take 1 tablet (2.5 mg total) by mouth daily. 30 tablet 0   atorvastatin (LIPITOR) 20 MG tablet Take 1 tablet (20 mg total) by mouth at bedtime. (Patient taking differently: Take 20 mg by mouth daily.) 90 tablet 1   carvedilol (COREG) 6.25 MG tablet Take 1 tablet (6.25 mg total) by mouth 2 (two) times daily with a meal. 120 tablet 3   cloNIDine (CATAPRES - DOSED IN MG/24 HR) 0.1 mg/24hr patch Place 1 patch (0.1 mg total) onto the skin once a week. 4 patch 12   cloNIDine (CATAPRES - DOSED IN MG/24 HR) 0.1 mg/24hr patch Place 1 patch (0.1 mg total) onto the skin once every 7 (seven) days. 1 patch 0   clopidogrel (PLAVIX) 75 MG tablet Take 1 tablet (75 mg total) by mouth daily. 30 tablet 0   clopidogrel (PLAVIX) 75 MG tablet Take 1 tablet by mouth daily.     diphenhydrAMINE (BENADRYL) 25 MG tablet Take 25 mg  by mouth every 6 (six) hours as needed for allergies.     hydrOXYzine (ATARAX) 25 MG tablet Take 1 tablet (25 mg total) by mouth at bedtime as needed (for sleep.). 21 tablet 0   isosorbide mononitrate (IMDUR) 30 MG 24 hr tablet Take 1 tablet (30 mg total) by mouth once daily. 30 tablet 4   losartan (COZAAR) 50 MG tablet Take 1 tablet (50 mg total) by mouth in the morning and at bedtime. 180 tablet 1   methocarbamol (ROBAXIN) 500 MG tablet Take 1 tablet (500 mg total) by mouth every 6 (six) hours as needed for muscle spasms. 30 tablet 0   metoprolol tartrate (LOPRESSOR) 100 MG tablet Take 1 tablet 2 hours prior to procedure. 1 tablet 0   nitroGLYCERIN (NITROSTAT) 0.4 MG SL tablet Place 1 tablet (0.4 mg total) under the tongue every 5 (five) minutes as needed for chest pain. 25 tablet 4   senna (SENOKOT) 8.6 MG TABS tablet Take 1 tablet (8.6 mg total) by mouth 2 (two) times daily. 60 tablet 0   No current  facility-administered medications for this visit.     Review of Systems    He denies chest pain, palpitations, dyspnea, pnd, orthopnea, n, v, dizziness, syncope, edema, weight gain, or early satiety. All other systems reviewed and are otherwise negative except as noted above.   Physical Exam    VS:  BP 127/73   Pulse 66   Ht 5\' 7"  (1.702 m)   Wt 160 lb 12.8 oz (72.9 kg)   SpO2 93%   BMI 25.18 kg/m  GEN: Well nourished, well developed, in no acute distress. HEENT: normal. Neck: Supple, no JVD, carotid bruits, or masses. Cardiac: RRR, no murmurs, rubs, or gallops. No clubbing, cyanosis, edema.  Radials/DP/PT 2+ and equal bilaterally.  Respiratory:  Respirations regular and unlabored, clear to auscultation bilaterally. GI: Soft, nontender, nondistended, BS + x 4. MS: no deformity or atrophy. Skin: warm and dry, no rash. Neuro:  Strength and sensation are intact. Psych: Normal affect.  Accessory Clinical Findings    ECG personally reviewed by me today - NSR, 64 bpm, LAD, ST/T wave abnormality - no acute changes.   Lab Results  Component Value Date   WBC 5.8 08/22/2021   HGB 12.0 (L) 08/22/2021   HCT 37.2 (L) 08/22/2021   MCV 70.5 (L) 08/22/2021   PLT 358 08/22/2021   Lab Results  Component Value Date   CREATININE 1.02 08/22/2021   BUN 10 08/22/2021   NA 140 08/22/2021   K 4.1 08/22/2021   CL 105 08/22/2021   CO2 23 08/22/2021   Lab Results  Component Value Date   ALT 12 07/22/2021   AST 16 07/22/2021   ALKPHOS 140 (H) 07/22/2021   BILITOT 0.3 07/22/2021   Lab Results  Component Value Date   CHOL 193 02/24/2021   HDL 48 02/24/2021   LDLCALC 118 (H) 02/24/2021   TRIG 152 (H) 02/24/2021   CHOLHDL 4.0 02/24/2021    Lab Results  Component Value Date   HGBA1C 6.1 (H) 02/24/2021    Assessment & Plan    1. CAD/chest pain: Stress test in 2021 was low risk, EF 45-50%.  Multiple recent ED visits in the setting of exertional chest pain.  Recent coronary CTA  revealed calcium score of 844 (94th percentile), extensive plaque in the p-mid LAD, moderate stenosis in RCA, extensive left circumflex disease extending into the L PDA, FFR was inconclusive.  Should he have recurrent symptoms, invasive  evaluation was recommended.  Reviewed CT results and discussed possible need for cath in the future, patient verbalized understanding.  Given lack of recent symptoms, will continue medical therapy for now.  Reviewed ED precautions. Continue amlodipine, carvedilol, losartan, isosorbide, Lipitor.  2. Hypertension: BP well controlled. Continue current antihypertensive regimen.  If BP elevated above goal in future, could consider increasing amlodipine versus switching from losartan to valsartan.   3. Hyperlipidemia: LDL was 118 in 02/2021. Monitored and managed per PCP. Continue Lipitor.    4. PAD: S/p multiple interventions, s/p L AKA in 11/2020,  follows with vascular surgery.  Continue Plavix, Lipitor.   5. Tobacco use: He is no longer smoking.    6. Disposition: Follow-up in  4-6 months.      Joylene Grapes, NP 10/20/2021, 2:39 PM

## 2021-10-20 NOTE — Patient Instructions (Signed)
Medication Instructions:  Your physician recommends that you continue on your current medications as directed. Please refer to the Current Medication list given to you today.  *If you need a refill on your cardiac medications before your next appointment, please call your pharmacy*   Lab Work: NONE ordered at this time of appointment   If you have labs (blood work) drawn today and your tests are completely normal, you will receive your results only by: MyChart Message (if you have MyChart) OR A paper copy in the mail If you have any lab test that is abnormal or we need to change your treatment, we will call you to review the results.   Testing/Procedures: NONE ordered at this time of appointment     Follow-Up: At [facility], you and your health needs are our priority.  As part of our continuing mission to provide you with exceptional heart care, we have created designated Provider Care Teams.  These Care Teams include your primary Cardiologist (physician) and Advanced Practice Providers (APPs -  Physician Assistants and Nurse Practitioners) who all work together to provide you with the care you need, when you need it.  We recommend signing up for the patient portal called "MyChart".  Sign up information is provided on this After Visit Summary.  MyChart is used to connect with patients for Virtual Visits (Telemedicine).  Patients are able to view lab/test results, encounter notes, upcoming appointments, etc.  Non-urgent messages can be sent to your provider as well.   To learn more about what you can do with MyChart, go to [email or web address].    Your next appointment:   4-6 month(s)  The format for your next appointment:   In Person  Provider:   Bryan Lemma, MD     Other Instructions   Important Information About Sugar

## 2021-11-08 ENCOUNTER — Encounter
Payer: Medicaid Other | Attending: Physical Medicine and Rehabilitation | Admitting: Physical Medicine and Rehabilitation

## 2021-11-08 ENCOUNTER — Other Ambulatory Visit: Payer: Self-pay

## 2021-11-08 VITALS — BP 111/69 | HR 78 | Ht 67.0 in | Wt 169.8 lb

## 2021-11-08 DIAGNOSIS — S78112A Complete traumatic amputation at level between left hip and knee, initial encounter: Secondary | ICD-10-CM | POA: Insufficient documentation

## 2021-11-08 DIAGNOSIS — H9319 Tinnitus, unspecified ear: Secondary | ICD-10-CM | POA: Diagnosis not present

## 2021-11-08 DIAGNOSIS — G4701 Insomnia due to medical condition: Secondary | ICD-10-CM | POA: Insufficient documentation

## 2021-11-08 MED ORDER — GABAPENTIN 100 MG PO CAPS
100.0000 mg | ORAL_CAPSULE | Freq: Every day | ORAL | 3 refills | Status: DC
Start: 2021-11-08 — End: 2022-01-04
  Filled 2021-11-08: qty 90, 90d supply, fill #0

## 2021-11-08 NOTE — Patient Instructions (Addendum)
Insomnia: -Try to go outside near sunrise -Get exercise during the day.  -Turn off all devices an hour before bedtime.  -Teas that can benefit: chamomile, valerian root, Brahmi (Bacopa) -Can consider over the counter melatonin, magnesium, and/or L-theanine. Melatonin is an anti-oxidant with multiple health benefits. Magnesium is involved in greater than 300 enzymatic reactions in the body and most of [name] are deficient as our soil is often depleted. There are 7 different types of magnesium- Bioptemizer's is a supplement with all 7 types, and each has unique benefits. Magnesium can also help with constipation and anxiety.  -Pistachios naturally increase the production of melatonin -Cozy Earth bamboo bed sheets are free from toxic chemicals.  -Tart cherry juice or a tart cherry supplement can improve sleep and soreness post-workout    Provided with list of supplements that can help with dyslipidemia: 1) Vitamin B3 500-4,000mg  in divided doses daily (would recommend starting low as can cause uncomfortable facial flushing if started at too high a dose) 2) Phytosterols 2.15 grams daily 3) Fermented soy 30-50 grams daily 4) EGCG (found in green tea): 500-1000mg  daily 5) Omega-3 fatty acids 3000-5,000mg  daily 6) Flax seed 40 grams daily 7) Monounsaturated fats 20-40 grams daily (olives, olive oil, nuts), also reduces cardiovascular disease 8) Sesame: 40 grams daily 9) Gamma/delta tocotrienols- a family of unsaturated forms of Vitamin E- 200mg  with dinner 10) Pantethine 900mg  daily in divided doses 11) Resveratrol 250mg  daily 12) N Acetyl Cysteine 2000mg  daily in divided doses 13) Curcumin 2000-5000mg  in divided doses daily 14) Pomegranate juice: 8 ounces daily, also helps to lower blood pressure 15) Pomegranate seeds one cup daily, also helps to lower blood pressure 16) Citrus Bergamot 1000mg  daily, also helps with glucose control and weight loss 17) Vitamin C 500mg  daily 18) Quercetin 500-1000mg   daily 19) Glutathione 20) Probiotics 60-100 billion organisms per day 21) Fiber 22) Oats 23) Aged garlic (can eat as food or supplement of 600-900mg  per day) 24) Chia seeds 25 grams per day 25) Lycopene- carotenoid found in high concentrations in tomatoes. 26) Alpha linolenic acid 27) Flavonoids and anthocyanins 28) Wogonin- flavanoid that enhances reverse cholesterol transport 29) Coenzyme Q10 30) Pantethine- derivative of Vitamin B5: 300mg  three times per day or 450mg  twice per day with or without food 31) Barley and other whole grains 32) Orange juice 33) L- carnitine 34) L- Lysine 35) L- Arginine 36) Almonds 37) Morin 38) Rutin 39) Carnosine 40) Histidine  41) Kaempferol  42) Organosulfur compounds 43) Vitamin E 44) Oleic acid 45) RBO (ferulic acid gammaoryzanol) 46) grape seed extract 47) Red wine 48) Berberine HCL 500mg  daily or twice per day- more effective and with fewer adverse effects that ezetimibe monotherapy 49) red yeast rice 2400- 4800 mg/day 50) chlorella 51) Licorice

## 2021-11-08 NOTE — Progress Notes (Signed)
Subjective:    Patient ID: Corene CorneaDavid L Gundry, male    DOB: 05/30/1960, 61 y.o.   MRN: [id number]  HPI Mr. Tera MaterCowan is a 61 year old man who presents for f/u of left AKA.  1) Tinnitus -occurs at 5am when he takes Losartan  -he is getting all his meds inpackaged form.  -needs to talk with someone about this -has an appointment  2) Left AKA -sometimes hurts when he is laying down -he gets it to the rest and then he feels alright -he is not taking gabapentin -got prosthesis but it is too long and he spoke to [facility].  -[facility] [employer]  3) Insomnia: -he takes a benadryl sometime -not sleeping that well.   Pain Inventory Average Pain 0 Pain Right Now 0 My pain is  na  In the last 24 hours, has pain interfered with the following? General activity 0 Relation with others 0 Enjoyment of life 0 What TIME of day is your pain at its worst? night Sleep (in general) Fair  Pain is worse with:  na Pain improves with:  na Relief from Meds:  na  Family History  Problem Relation Age of Onset   Arthritis Mother    COPD Father    Stroke Father    Multiple sclerosis Sister    Social History   Socioeconomic History   Marital status: Single    Spouse name: Not on file   Number of children: Not on file   Years of education: 12   Highest education level: Not on file  Occupational History   Not on file  Tobacco Use   Smoking status: Former    Packs/day: 1.50    Years: 29.00    Total pack years: 43.50    Types: Cigarettes    Quit date: 10/22/2020    Years since quitting: 1.0   Smokeless tobacco: Never   Tobacco comments:    Quit smoking September 2022.  Using Nicoderm patch currently.  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   He lives with his sister and her son.    Walks every day to work at least 15+ minutes.   Social Determinants of Health   Financial Resource Strain: Medium Risk (02/18/2021)   Overall Financial Resource Strain (CARDIA)    Difficulty of Paying Living Expenses: Somewhat hard  Food Insecurity: No Food Insecurity (02/18/2021)   Hunger Vital Sign    Worried About Running Out of Food in the Last Year: Never true    Ran Out of Food in the Last Year: Never true  Transportation Needs: No Transportation Needs (02/18/2021)   PRAPARE - Administrator, Civil ServiceTransportation    Lack of Transportation (Medical): No    Lack of Transportation (Non-Medical): No  Physical Activity: Inactive (02/18/2021)   Exercise Vital Sign    Days of Exercise per Week: 0 days    Minutes of Exercise per Session: 0 min  Stress: Stress Concern Present (02/18/2021)   [employer] - Occupational Stress Questionnaire    Feeling of Stress : To some extent  Social Connections: Unknown (02/18/2021)   Social Connection and Isolation Panel [NHANES]    Frequency of Communication with Friends and Family: More than three  times a week    Frequency of Social Gatherings with Friends and Family: More than three times a week    Attends Religious Services: Not on file    Active Member of Clubs or Organizations: Not on file    Attends Banker Meetings: Not on file    Marital Status: Never married   Past Surgical History:  Procedure Laterality Date   ABDOMINAL AORTOGRAM W/LOWER EXTREMITY N/A 07/12/2016   Procedure: Abdominal Aortogram w/Lower Extremity;  Surgeon: Nada Libman, MD;  Location: [facility];  Service: Cardiovascular;  Laterality: N/A;  Bilateral extermity: Patent Renal As. No sig Dz in infrarenal Abd Aorta. Normal Bilat Iliac arteries. R SFA is 100% @ origin - recon in AK-Pop A. R PT A patent. L CFA occluded. L PFA recon @ origin, L SFA occluded w/ recon in AK Pop A.   ABDOMINAL AORTOGRAM W/LOWER EXTREMITY N/A 10/15/2019   Procedure:  ABDOMINAL AORTOGRAM W/LOWER EXTREMITY;  Surgeon: Nada Libman, MD;  Location: [facility];  Service: Cardiovascular;  Laterality: N/A;   AMPUTATION Left 10/04/2020   Procedure: LEFT ABOVE KNEE AMPUTATION;  Surgeon: Nada Libman, MD;  Location: [facility] OR;  Service: Vascular;  Laterality: Left;   AMPUTATION Left 12/08/2020   Procedure: RIGHT ABOVE KNEE AMPUTATION REVISION;  Surgeon: Chuck Hint, MD;  Location: [facility];  Service: Vascular;  Laterality: Left;   AORTA - BILATERAL FEMORAL ARTERY BYPASS GRAFT N/A 05/08/2020   Procedure: AORTA BIFEMORAL BYPASS GRAFT;  Surgeon: Nada Libman, MD;  Location: [facility] OR;  Service: Vascular;  Laterality: N/A;   COLONOSCOPY  06/2016   never   ENDARTERECTOMY FEMORAL Left 11/14/2019   Procedure: Left groin exploration, Redo left femoral artery exposure;  Surgeon: Nada Libman, MD;  Location: [facility] OR;  Service: Vascular;  Laterality: Left;   FEMORAL-FEMORAL BYPASS GRAFT N/A 11/13/2019   Procedure: BYPASS GRAFT FEMORAL-FEMORAL ARTERY RIGHT TO LEFT USING HEMASHIELD GOLD GRAFT 8mm x 30cm;  Surgeon: Nada Libman, MD;  Location[facility] OR;  Service: Vascular;  Laterality: N/A;   FEMORAL-POPLITEAL BYPASS GRAFT Left 11/13/2019   Procedure: LEFT FEMORAL BELOW KNEE-POPLITEAL ARTERY USING NON-REVERSED GREATER SAPHENOUS VEIN;  Surgeon: Nada Libman, MD;  Location: [facility];  Service: Vascular;  Laterality: Left;   FEMORAL-POPLITEAL BYPASS GRAFT Left 05/08/2020   Procedure: LEFT REDO BYPASS GRAFT COMMON FEMORAL- BELOW KNEE POPLITEAL ARTERY USING PROPATEN GRAFT;  Surgeon: Nada Libman, MD;  Location: [facility];  Service: Vascular;  Laterality: Left;   I & D EXTREMITY Left 09/07/2020   Procedure: IRRIGATION AND DEBRIDEMENT LEFT LEG WITH EXCISION OF LEFT LEG DISTAL BYPASS GRAFT;  Surgeon: Cephus Shelling, MD;  Location: [facility];  Service: Vascular;  Laterality: Left;   LOWER EXTREMITY ANGIOGRAM Left 11/14/2019   Procedure: LEFT LOWER EXTREMITY  ANGIOGRAM, BYPASS GRAFT ANGIOPLASTY;  Surgeon: Nada Libman, MD;  [facility]: [facility];  Service: Vascular;  Laterality: Left;   PERIPHERAL VASCULAR INTERVENTION  10/15/2019   Procedure: PERIPHERAL VASCULAR INTERVENTION;  Surgeon: Nada Libman, MD;  [facility];  Service: Cardiovascular;;  Rt Iliac   REMOVAL OF GRAFT Left 10/04/2020   Procedure: REMOVAL OF LEFT FEMORAL TO POPITEAL BYPASS GRAFT;  Surgeon: Nada Libman, MD;  [facility];  Service: Vascular;  Laterality: Left;   Past Surgical History:  Procedure Laterality Date   ABDOMINAL AORTOGRAM W/LOWER EXTREMITY N/A 07/12/2016   Procedure: Abdominal Aortogram w/Lower Extremity;  Surgeon: Nada Libman, MD;  [facility];  Service: Cardiovascular;  Laterality: N/A;  Bilateral extermity: Patent Renal As. No sig Dz in infrarenal Abd Aorta. Normal Bilat Iliac arteries. R SFA is 100% @ origin - recon in AK-Pop A. R PT A patent. L CFA occluded. L PFA recon @ origin, L SFA occluded w/ recon in AK Pop A.   ABDOMINAL AORTOGRAM W/LOWER EXTREMITY N/A 10/15/2019   Procedure: ABDOMINAL AORTOGRAM W/LOWER EXTREMITY;  Surgeon: Nada Libman, MD;  [facility];  Service: Cardiovascular;  Laterality: N/A;   AMPUTATION Left 10/04/2020   Procedure: LEFT ABOVE KNEE AMPUTATION;  Surgeon: Nada Libman, MD;  Location: [facility] OR;  Service: Vascular;  Laterality: Left;   AMPUTATION Left 12/08/2020   Procedure: RIGHT ABOVE KNEE AMPUTATION REVISION;  Surgeon: Chuck Hint, MD;  Location: [facility] OR;  Service: Vascular;  Laterality: Left;   AORTA - BILATERAL FEMORAL ARTERY BYPASS GRAFT N/A 05/08/2020   Procedure: AORTA BIFEMORAL BYPASS GRAFT;  Surgeon: Nada Libman, MD;  [facility];  Service: Vascular;  Laterality: N/A;   COLONOSCOPY  06/2016   never   ENDARTERECTOMY FEMORAL Left 11/14/2019   Procedure: Left groin exploration, Redo left femoral artery exposure;  Surgeon: Nada Libman, MD;   [facility];  Service: Vascular;  Laterality: Left;   FEMORAL-FEMORAL BYPASS GRAFT N/A 11/13/2019   Procedure: BYPASS GRAFT FEMORAL-FEMORAL ARTERY RIGHT TO LEFT USING HEMASHIELD GOLD GRAFT 23mm x 30cm;  Surgeon: Nada Libman, MD;  Location: [facility] OR;  Service: Vascular;  Laterality: N/A;   FEMORAL-POPLITEAL BYPASS GRAFT Left 11/13/2019   Procedure: LEFT FEMORAL BELOW KNEE-POPLITEAL ARTERY USING NON-REVERSED GREATER SAPHENOUS VEIN;  Surgeon: Nada Libman, MD;  [facility];  Service: Vascular;  Laterality: Left;   FEMORAL-POPLITEAL BYPASS GRAFT Left 05/08/2020   Procedure: LEFT REDO BYPASS GRAFT COMMON FEMORAL- BELOW KNEE POPLITEAL ARTERY USING PROPATEN GRAFT;  Surgeon: Nada Libman, MD;  [facility];  Service: Vascular;  Laterality: Left;   I & D EXTREMITY Left 09/07/2020   Procedure: IRRIGATION AND DEBRIDEMENT LEFT LEG WITH EXCISION OF LEFT LEG DISTAL BYPASS GRAFT;  Surgeon: Cephus Shelling, MD;  [facility];  Service: Vascular;  Laterality: Left;   LOWER EXTREMITY ANGIOGRAM Left 11/14/2019   Procedure: LEFT LOWER EXTREMITY ANGIOGRAM, BYPASS GRAFT ANGIOPLASTY;  Surgeon: Nada Libman, MD;  [facility];  Service: Vascular;  Laterality: Left;   PERIPHERAL VASCULAR INTERVENTION  10/15/2019   Procedure: PERIPHERAL VASCULAR INTERVENTION;  Surgeon: Nada Libman, MD;  [facility];  Service: Cardiovascular;;  Rt Iliac   REMOVAL OF GRAFT Left 10/04/2020   Procedure: REMOVAL OF LEFT FEMORAL TO POPITEAL BYPASS GRAFT;  Surgeon: Nada Libman, MD;  [facility];  Service: Vascular;  Laterality: Left;   Past Medical History:  Diagnosis Date   Back pain    Diabetes mellitus without complication (HCC)    Heavy smoker    Hyperlipidemia LDL goal <70    Hypertension    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  Opioid Risk Score:   Fall Risk Score:   `1  Depression screen PHQ 2/9     03/16/2021    2:06 PM 02/18/2021   10:10 AM 07/22/2020   10:30 AM 07/01/2019    9:08 AM 05/15/2019    9:35 AM 01/20/2017    8:37 AM  Depression screen PHQ 2/9  Decreased Interest 2 1 0 1 1 0  Down, Depressed, Hopeless 0 1 0 0 1 0  PHQ - 2 Score 2 2 0 1 2 0  Altered sleeping 1   2 3    Tired, decreased energy 1   1 1    Change in appetite 0   0 2   Feeling bad or failure about yourself  1   0 2   Trouble concentrating 0   0 0   Moving slowly or fidgety/restless 0   0 0   Suicidal thoughts 0   0 0   PHQ-9 Score 5   4 10    Difficult doing work/chores Somewhat difficult   Not difficult at all Extremely dIfficult       Review of Systems  Musculoskeletal:        Stump  All other systems reviewed and are negative.      Objective:   Physical Exam Gen: no distress, normal appearing, BMI 26.59 HEENT: oral mucosa pink and moist, NCAT Cardio: Reg rate Chest: normal effort, normal rate of breathing Abd: soft, non-distended Ext: no edema Psych: pleasant, normal affect Skin: intact Neuro: Alert and oriented x3 Musculoskeletal: Left AKA    Assessment & Plan:   1) Left AKA -encouraged follow-up with [facility] for new prosthesis -referred to PT -continue multivitamin  2) HTN: -commended on improved BP control -discontinue amlodipine -discussed clonidine patch also helps with neuropathic pain  3) Tinnitus: -appears to be associated with blood pressure medication, discussed decreasing Coreg to 6.25mg  BID given improved BP control, sent new script -blood pressure is well controlled.  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  -Discussed benefits of exercise in reducing pain. -Discussed following foods that may reduce pain: 1) Ginger (especially studied for arthritis)- reduce leukotriene production to decrease  inflammation 2) Blueberries- high in phytonutrients that decrease inflammation 3) Salmon- marine omega-3s reduce joint swelling and pain 4) Pumpkin seeds- reduce inflammation 5) dark chocolate- reduces inflammation 6) turmeric- reduces inflammation 7) tart cherries - reduce pain and stiffness 8) extra virgin olive oil - its compound olecanthal helps to block prostaglandins  9) chili peppers- can be eaten or applied topically via capsaicin 10) mint- helpful for headache, muscle aches, joint pain, and itching 11) garlic- reduces inflammation  Link to further information on diet for chronic pain: [email or web address].[email or web address]    5) Insomnia: -Try to go outside near sunrise -Get exercise during the day.  -Turn off all devices an hour before bedtime.  -Teas that can benefit: chamomile, valerian root, Brahmi (Bacopa) -Can consider over the counter melatonin, magnesium, and/or L-theanine. Melatonin is an anti-oxidant with multiple health benefits. Magnesium is involved in greater than 300 enzymatic reactions in the body and most of [name] are deficient as our soil is often depleted. There are 7 different types of magnesium- Bioptemizer's is a supplement with all 7 types, and each has unique benefits. Magnesium can also help with constipation and anxiety.  -Pistachios naturally increase the production of melatonin -Cozy Earth bamboo bed sheets are free from toxic chemicals.  -Tart cherry juice or a tart cherry supplement can improve sleep and soreness post-workout  6)  HLD:  -can discuss with PCP risks and benefits of decreasing statin dose given his fatigue and LDL level 47 -advised taking Coenzyme Q10 in the meantime to help improve his energy.  Provided with list of supplements  that can help with dyslipidemia: 1) Vitamin B3 500-4,000mg  in divided doses daily (would recommend starting low as can cause uncomfortable facial flushing if  started at too high a dose) 2) Phytosterols 2.15 grams daily 3) Fermented soy 30-50 grams daily 4) EGCG (found in green tea): 500-1000mg  daily 5) Omega-3 fatty acids 3000-5,000mg  daily 6) Flax seed 40 grams daily 7) Monounsaturated fats 20-40 grams daily (olives, olive oil, nuts), also reduces cardiovascular disease 8) Sesame: 40 grams daily 9) Gamma/delta tocotrienols- a family of unsaturated forms of Vitamin E- 200mg  with dinner 10) Pantethine 900mg  daily in divided doses 11) Resveratrol 250mg  daily 12) N Acetyl Cysteine 2000mg  daily in divided doses 13) Curcumin 2000-5000mg  in divided doses daily 14) Pomegranate juice: 8 ounces daily, also helps to lower blood pressure 15) Pomegranate seeds one cup daily, also helps to lower blood pressure 16) Citrus Bergamot 1000mg  daily, also helps with glucose control and weight loss 17) Vitamin C 500mg  daily 18) Quercetin 500-1000mg  daily 19) Glutathione 20) Probiotics 60-100 billion organisms per day 21) Fiber 22) Oats 23) Aged garlic (can eat as food or supplement of 600-900mg  per day) 24) Chia seeds 25 grams per day 25) Lycopene- carotenoid found in high concentrations in tomatoes. 26) Alpha linolenic acid 27) Flavonoids and anthocyanins 28) Wogonin- flavanoid that enhances reverse cholesterol transport 29) Coenzyme Q10 30) Pantethine- derivative of Vitamin B5: 300mg  three times per day or 450mg  twice per day with or without food 31) Barley and other whole grains 32) Orange juice 33) L- carnitine 34) L- Lysine 35) L- Arginine 36) Almonds 37) Morin 38) Rutin 39) Carnosine 40) Histidine  41) Kaempferol  42) Organosulfur compounds 43) Vitamin E 44) Oleic acid 45) RBO (ferulic acid gammaoryzanol) 46) grape seed extract 47) Red wine 48) Berberine HCL 500mg  daily or twice per day- more effective and with fewer adverse effects that ezetimibe monotherapy 49) red yeast rice 2400- 4800 mg/day 50) chlorella 51) Licorice

## 2021-11-08 NOTE — Progress Notes (Signed)
Subjective:    Patient ID: Benjamin [name], male    DOB: Sep 09, 1960, 61 y.o.   MRN: [id number]  HPI Mr. Helwig is a 61 year old man who presents for f/u of left AKA.  1) Tinnitus -occurs at 5am when he takes Losartan  -he is getting all his meds inpackaged form.   2) Left AKA -sometimes hurts when he is laying down -he gets it to the rest and then he feels alright -he is not taking gabapentin -got prosthesis but it is too long and he spoke to [facility].  -going to [employer]  3) Insomnia: -he takes a benadryl sometime  Pain Inventory Average Pain 8 Pain Right Now 0 My pain is aching  In the last 24 hours, has pain interfered with the following? General activity 6 Relation with others 8 Enjoyment of life 5 What TIME of day is your pain at its worst? night Sleep (in general) Poor  Pain is worse with: unsure Pain improves with: injections Relief from Meds: 7  Family History  Problem Relation Age of Onset   Arthritis Mother    COPD Father    Stroke Father    Multiple sclerosis Sister    Social History   Socioeconomic History   Marital status: Single    Spouse name: Not on file   Number of children: Not on file   Years of education: 12   Highest education level: Not on file  Occupational History   Not on file  Tobacco Use   Smoking status: Former    Packs/day: 1.50    Years: 29.00    Total pack years: 43.50    Types: Cigarettes    Quit date: 10/22/2020    Years since quitting: 1.0   Smokeless tobacco: Never   Tobacco comments:    Quit smoking September 2022.  Using Nicoderm patch currently.  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   He lives with his sister and her son.   Walks every day to work at least 15+ minutes.   Social Determinants of Health    Financial Resource Strain: Medium Risk (02/18/2021)   Overall Financial Resource Strain (CARDIA)    Difficulty of Paying Living Expenses: Somewhat hard  Food Insecurity: No Food Insecurity (02/18/2021)   Hunger Vital Sign    Worried About Running Out of Food in the Last Year: Never true    Ran Out of Food in the Last Year: Never true  Transportation Needs: No Transportation Needs (02/18/2021)   PRAPARE - Administrator, Civil Service (Medical): No    Lack of Transportation (Non-Medical): No  Physical Activity: Inactive (02/18/2021)   Exercise Vital Sign    Days of Exercise per Week: 0 days    Minutes of Exercise per Session: 0 min  Stress: Stress Concern Present (02/18/2021)   [employer] of Occupational Health - Occupational Stress Questionnaire    Feeling of Stress : To some extent  Social Connections: Unknown (02/18/2021)   Social Connection and Isolation Panel [NHANES]    Frequency of Communication with Friends and Family: More than three times a week    Frequency of Social Gatherings with Friends and Family: More than three times a  week    Attends Religious Services: Not on file    Active Member of Clubs or Organizations: Not on file    Attends Club or Organization Meetings: Not on file    Marital Status: Never married   Past Surgical History:  Procedure Laterality Date   ABDOMINAL AORTOGRAM W/LOWER EXTREMITY N/A 07/12/2016   Procedure: Abdominal Aortogram w/Lower Extremity;  Surgeon: Nada Libman, MD;  [facility];  Service: Cardiovascular;  Laterality: N/A;  Bilateral extermity: Patent Renal As. No sig Dz in infrarenal Abd Aorta. Normal Bilat Iliac arteries. R SFA is 100% @ origin - recon in AK-Pop A. R PT A patent. L CFA occluded. L PFA recon @ origin, L SFA occluded w/ recon in AK Pop A.   ABDOMINAL AORTOGRAM W/LOWER EXTREMITY N/A 10/15/2019   Procedure: ABDOMINAL AORTOGRAM W/LOWER EXTREMITY;  Surgeon: Nada Libman, MD;  [facility];  Service: Cardiovascular;  Laterality: N/A;   AMPUTATION Left 10/04/2020   Procedure: LEFT ABOVE KNEE AMPUTATION;  Surgeon: Nada Libman, MD;  Location: [facility] OR;  Service: Vascular;  Laterality: Left;   AMPUTATION Left 12/08/2020   Procedure: RIGHT ABOVE KNEE AMPUTATION REVISION;  Surgeon: Chuck Hint, MD;  Location:  [facility];  Service: Vascular;  Laterality: Left;   AORTA - BILATERAL FEMORAL ARTERY BYPASS GRAFT N/A 05/08/2020   Procedure: AORTA BIFEMORAL BYPASS GRAFT;  Surgeon: Nada Libman, MD;  [facility]: [facility];  Service: Vascular;  Laterality: N/A;   COLONOSCOPY  06/2016   never   ENDARTERECTOMY FEMORAL Left 11/14/2019   Procedure: Left groin exploration, Redo left femoral artery exposure;  Surgeon: Nada Libman, MD;  [facility];  Service: Vascular;  Laterality: Left;   FEMORAL-FEMORAL BYPASS GRAFT N/A 11/13/2019   Procedure: BYPASS GRAFT FEMORAL-FEMORAL ARTERY RIGHT TO LEFT USING HEMASHIELD GOLD GRAFT 33mm x 30cm;  Surgeon: Nada Libman, MD;  Location: [facility] OR;  Service: Vascular;  Laterality: N/A;   FEMORAL-POPLITEAL BYPASS GRAFT Left 11/13/2019   Procedure: LEFT FEMORAL BELOW KNEE-POPLITEAL ARTERY USING NON-REVERSED GREATER SAPHENOUS VEIN;  Surgeon: Nada Libman, MD;  [facility];  Service: Vascular;  Laterality: Left;   FEMORAL-POPLITEAL BYPASS GRAFT Left 05/08/2020   Procedure: LEFT REDO BYPASS GRAFT COMMON FEMORAL- BELOW KNEE POPLITEAL ARTERY USING PROPATEN GRAFT;  Surgeon: Nada Libman, MD;  [facility];  Service: Vascular;  Laterality: Left;   I & D EXTREMITY Left 09/07/2020   Procedure: IRRIGATION AND DEBRIDEMENT LEFT LEG WITH EXCISION OF LEFT LEG DISTAL BYPASS GRAFT;  Surgeon: Cephus Shelling, MD;  [facility];  Service: Vascular;  Laterality: Left;   LOWER EXTREMITY ANGIOGRAM Left 11/14/2019   Procedure: LEFT LOWER EXTREMITY ANGIOGRAM, BYPASS GRAFT ANGIOPLASTY;  Surgeon: Nada Libman, MD;  [facility];   Service: Vascular;  Laterality: Left;   PERIPHERAL VASCULAR INTERVENTION  10/15/2019   Procedure: PERIPHERAL VASCULAR INTERVENTION;  Surgeon: Nada Libman, MD;  [facility];  Service: Cardiovascular;;  Rt Iliac   REMOVAL OF GRAFT Left 10/04/2020   Procedure: REMOVAL OF LEFT FEMORAL TO POPITEAL BYPASS GRAFT;  Surgeon: Nada Libman, MD;  [facility];  Service: Vascular;  Laterality: Left;   Past Surgical History:  Procedure Laterality Date   ABDOMINAL AORTOGRAM W/LOWER EXTREMITY N/A 07/12/2016   Procedure: Abdominal Aortogram w/Lower Extremity;  Surgeon: Nada Libman, MD;  [facility];  Service: Cardiovascular;  Laterality: N/A;  Bilateral extermity: Patent Renal As. No  sig Dz in infrarenal Abd Aorta. Normal Bilat Iliac arteries. R SFA is 100% @ origin - recon in AK-Pop A. R PT A patent. L CFA occluded. L PFA recon @ origin, L SFA occluded w/ recon in AK Pop A.   ABDOMINAL AORTOGRAM W/LOWER EXTREMITY N/A 10/15/2019   Procedure: ABDOMINAL AORTOGRAM W/LOWER EXTREMITY;  Surgeon: Serafina Mitchell, MD;  Location: [place] [facility];  Service: Cardiovascular;  Laterality: N/A;   AMPUTATION Left 10/04/2020   Procedure: LEFT ABOVE KNEE AMPUTATION;  Surgeon: Serafina Mitchell, MD;  Location: [place];  Service: Vascular;  Laterality: Left;   AMPUTATION Left 12/08/2020   Procedure: RIGHT ABOVE KNEE AMPUTATION REVISION;  Surgeon: Angelia Mould, MD;  Location: [facility] OR;  Service: Vascular;  Laterality: Left;   AORTA - BILATERAL FEMORAL ARTERY BYPASS GRAFT N/A 05/08/2020   Procedure: AORTA BIFEMORAL BYPASS GRAFT;  Surgeon: Serafina Mitchell, MD;  [facility];  Service: Vascular;  Laterality: N/A;   COLONOSCOPY  06/2016   never   ENDARTERECTOMY FEMORAL Left 11/14/2019   Procedure: Left groin exploration, Redo left femoral artery exposure;  Surgeon: Serafina Mitchell, MD;  [facility];  Service: Vascular;  Laterality: Left;   FEMORAL-FEMORAL BYPASS GRAFT  N/A 11/13/2019   Procedure: BYPASS GRAFT FEMORAL-FEMORAL ARTERY RIGHT TO LEFT USING HEMASHIELD GOLD GRAFT 46mm x 30cm;  Surgeon: Serafina Mitchell, MD;  Location: [facility] OR;  Service: Vascular;  Laterality: N/A;   FEMORAL-POPLITEAL BYPASS GRAFT Left 11/13/2019   Procedure: LEFT FEMORAL BELOW KNEE-POPLITEAL ARTERY USING NON-REVERSED GREATER SAPHENOUS VEIN;  Surgeon: Serafina Mitchell, MD;  [facility];  Service: Vascular;  Laterality: Left;   FEMORAL-POPLITEAL BYPASS GRAFT Left 05/08/2020   Procedure: LEFT REDO BYPASS GRAFT COMMON FEMORAL- BELOW KNEE POPLITEAL ARTERY USING PROPATEN GRAFT;  Surgeon: Serafina Mitchell, MD;  [facility];  Service: Vascular;  Laterality: Left;   I & D EXTREMITY Left 09/07/2020   Procedure: IRRIGATION AND DEBRIDEMENT LEFT LEG WITH EXCISION OF LEFT LEG DISTAL BYPASS GRAFT;  Surgeon: Marty Heck, MD;  Location: [place];  Service: Vascular;  Laterality: Left;   LOWER EXTREMITY ANGIOGRAM Left 11/14/2019   Procedure: LEFT LOWER EXTREMITY ANGIOGRAM, BYPASS GRAFT ANGIOPLASTY;  Surgeon: Serafina Mitchell, MD;  [facility];  Service: Vascular;  Laterality: Left;   PERIPHERAL VASCULAR INTERVENTION  10/15/2019   Procedure: PERIPHERAL VASCULAR INTERVENTION;  Surgeon: Serafina Mitchell, MD;  Location: [place] [facility];  Service: Cardiovascular;;  Rt Iliac   REMOVAL OF GRAFT Left 10/04/2020   Procedure: REMOVAL OF LEFT FEMORAL TO POPITEAL BYPASS GRAFT;  Surgeon: Serafina Mitchell, MD;  [facility];  Service: Vascular;  Laterality: Left;   Past Medical History:  Diagnosis Date   Back pain    Diabetes mellitus without complication ([place])    Heavy smoker    Hyperlipidemia LDL goal <70    Hypertension    Peripheral arterial occlusive 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[place]) 06/2013   Bilateral femoral artery disease   BP 111/69   Pulse 78   Ht 5\' 7"  (1.702 m)   Wt 169 lb 12.8 oz (77 kg)   SpO2 92%   BMI 26.59 kg/m   Opioid Risk Score:   Fall Risk Score:  `1  Depression screen PHQ  2/9     03/16/2021    2:06 PM 02/18/2021   10:10 AM 07/22/2020   10:30 AM 07/01/2019    9:08 AM 05/15/2019    9:35 AM 01/20/2017    8:37  AM  Depression screen PHQ 2/9  Decreased Interest 2 1 0 1 1 0  Down, Depressed, Hopeless 0 1 0 0 1 0  PHQ - 2 Score 2 2 0 1 2 0  Altered sleeping 1   2 3    Tired, decreased energy 1   1 1    Change in appetite 0   0 2   Feeling bad or failure about yourself  1   0 2   Trouble concentrating 0   0 0   Moving slowly or fidgety/restless 0   0 0   Suicidal thoughts 0   0 0   PHQ-9 Score 5   4 10    Difficult doing work/chores Somewhat difficult   Not difficult at all Extremely dIfficult       Review of Systems  Musculoskeletal:        Stump  All other systems reviewed and are negative.      Objective:   Physical Exam Gen: no distress, normal appearing HEENT: oral mucosa pink and moist, NCAT Cardio: Reg rate Chest: normal effort, normal rate of breathing Abd: soft, non-distended Ext: no edema Psych: pleasant, normal affect Skin: intact Neuro: Alert and oriented x3 Musculoskeletal: Left AKA    Assessment & Plan:   1) Left AKA -encouraged follow-up with [facility] for new prosthesis  2) HTN: -commended on improved BP control  3) Tinnitus: -appears to be associated with blood pressure medication, discussed decreasing Coreg to 6.25mg  BID given improved BP control, sent new script  4) 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  -Discussed benefits of exercise in reducing pain. -Discussed following foods that may reduce pain: 1) Ginger (especially studied for arthritis)- reduce leukotriene production to decrease inflammation 2) Blueberries- high in phytonutrients that decrease inflammation 3) Salmon- marine omega-3s reduce joint swelling and pain 4) Pumpkin seeds- reduce inflammation 5) dark chocolate- reduces inflammation 6) turmeric- reduces inflammation 7) tart cherries -  reduce pain and stiffness 8) extra virgin olive oil - its compound olecanthal helps to block prostaglandins  9) chili peppers- can be eaten or applied topically via capsaicin 10) mint- helpful for headache, muscle aches, joint pain, and itching 11) garlic- reduces inflammation  Link to further information on diet for chronic pain: http://[email or web address]://www.[email or web address]/[email or web address]/diet-patients-chronic-pain    5) Insomnia: -Try to go outside near sunrise -Get exercise during the day.  -Turn off all devices an hour before bedtime.  -Teas that can benefit: chamomile, valerian root, Brahmi (Bacopa) -Can consider over the counter melatonin, magnesium, and/or L-theanine. Melatonin is an anti-oxidant with multiple health benefits. Magnesium is involved in greater than 300 enzymatic reactions in the body and most of us are deficient as our soil is often depleted. There are 7 different types of magnesium- Bioptemizer's is a supplement with all 7 types, and each has unique benefits. Magnesium can also help with constipation and anxiety.  -Pistachios naturally increase the production of melatonin -[employer] bamboo bed sheets are free from toxic chemicals.  -Tart cherry juice or a tart cherry supplement can improve sleep and soreness post-workout

## 2021-11-15 ENCOUNTER — Other Ambulatory Visit: Payer: Self-pay

## 2021-11-17 ENCOUNTER — Encounter: Payer: Self-pay | Admitting: Rehabilitation

## 2021-11-17 ENCOUNTER — Ambulatory Visit: Payer: Medicaid Other | Attending: Physical Medicine and Rehabilitation | Admitting: Rehabilitation

## 2021-11-17 ENCOUNTER — Other Ambulatory Visit: Payer: Self-pay

## 2021-11-17 DIAGNOSIS — R2689 Other abnormalities of gait and mobility: Secondary | ICD-10-CM | POA: Insufficient documentation

## 2021-11-17 DIAGNOSIS — S78112A Complete traumatic amputation at level between left hip and knee, initial encounter: Secondary | ICD-10-CM | POA: Diagnosis not present

## 2021-11-17 DIAGNOSIS — M6281 Muscle weakness (generalized): Secondary | ICD-10-CM | POA: Insufficient documentation

## 2021-11-17 DIAGNOSIS — R2681 Unsteadiness on feet: Secondary | ICD-10-CM | POA: Insufficient documentation

## 2021-11-17 DIAGNOSIS — R293 Abnormal posture: Secondary | ICD-10-CM | POA: Diagnosis present

## 2021-11-17 NOTE — Therapy (Signed)
OUTPATIENT PHYSICAL THERAPY PROSTHETICS EVALUATION   Patient Name: Benjamin [name] MRN: [id number] DOB:[birth year], 61 y.o., male Today's Date: 11/17/2021  PCP: Dianna Rossetti, NP REFERRING PROVIDER: Secundino Ginger, MD   PT End of Session - 11/17/21 1034     Visit Number 1    Number of Visits 13    Date for PT Re-Evaluation 01/01/22    Authorization Type [employer]    PT Start Time 1017    PT Stop Time 1114    PT Time Calculation (min) 57 min    Activity Tolerance Patient tolerated treatment well    Behavior During Therapy [facility] for tasks assessed/performed             Past Medical History:  Diagnosis Date   Back pain    Diabetes mellitus without complication ([place])    Heavy smoker    Hyperlipidemia LDL goal <70    Hypertension    Peripheral arterial occlusive disease ([place]) 06/2013   Bilateral femoral artery disease   Past Surgical History:  Procedure Laterality Date   ABDOMINAL AORTOGRAM W/LOWER EXTREMITY N/A 07/12/2016   Procedure: Abdominal Aortogram w/Lower Extremity;  Surgeon: Serafina Mitchell, MD;  Location: [place] [facility];  Service: Cardiovascular;  Laterality: N/A;  Bilateral extermity: Patent Renal As. No sig Dz in infrarenal Abd Aorta. Normal Bilat Iliac arteries. R SFA is 100% @ origin - recon in AK-Pop A. R PT A patent. L CFA occluded. L PFA recon @ origin, L SFA occluded w/ recon in AK Pop A.   ABDOMINAL AORTOGRAM W/LOWER EXTREMITY N/A 10/15/2019   Procedure: ABDOMINAL AORTOGRAM W/LOWER EXTREMITY;  Surgeon: Serafina Mitchell, MD;  Location: [place] [facility];  Service: Cardiovascular;  Laterality: N/A;   AMPUTATION Left 10/04/2020   Procedure: LEFT ABOVE KNEE AMPUTATION;  Surgeon: Serafina Mitchell, MD;  Location: [place];  Service: Vascular;  Laterality: Left;   AMPUTATION Left 12/08/2020   Procedure: RIGHT ABOVE KNEE AMPUTATION REVISION;  Surgeon: Angelia Mould, MD;  Location: [facility] OR;  Service: Vascular;  Laterality: Left;   AORTA -  BILATERAL FEMORAL ARTERY BYPASS GRAFT N/A 05/08/2020   Procedure: AORTA BIFEMORAL BYPASS GRAFT;  Surgeon: Serafina Mitchell, MD;  [facility];  Service: Vascular;  Laterality: N/A;   COLONOSCOPY  06/2016   never   ENDARTERECTOMY FEMORAL Left 11/14/2019   Procedure: Left groin exploration, Redo left femoral artery exposure;  Surgeon: Serafina Mitchell, MD;  Location: [facility];  Service: Vascular;  Laterality: Left;   FEMORAL-FEMORAL BYPASS GRAFT N/A 11/13/2019   Procedure: BYPASS GRAFT FEMORAL-FEMORAL ARTERY RIGHT TO LEFT USING HEMASHIELD GOLD GRAFT 45mm x 30cm;  Surgeon: Serafina Mitchell, MD;  Location: [facility] OR;  Service: Vascular;  Laterality: N/A;   FEMORAL-POPLITEAL BYPASS GRAFT Left 11/13/2019   Procedure: LEFT FEMORAL BELOW KNEE-POPLITEAL ARTERY USING NON-REVERSED GREATER SAPHENOUS VEIN;  Surgeon: Serafina Mitchell, MD;  [facility];  Service: Vascular;  Laterality: Left;   FEMORAL-POPLITEAL BYPASS GRAFT Left 05/08/2020   Procedure: LEFT REDO BYPASS GRAFT COMMON FEMORAL- BELOW KNEE POPLITEAL ARTERY USING PROPATEN GRAFT;  Surgeon: Serafina Mitchell, MD;  [facility];  Service: Vascular;  Laterality: Left;   I & D EXTREMITY Left 09/07/2020   Procedure: IRRIGATION AND DEBRIDEMENT LEFT LEG WITH EXCISION OF LEFT LEG DISTAL BYPASS GRAFT;  Surgeon: Marty Heck, MD;  Location: [place];  Service: Vascular;  Laterality: Left;   LOWER EXTREMITY ANGIOGRAM Left 11/14/2019   Procedure: LEFT LOWER EXTREMITY ANGIOGRAM,  BYPASS GRAFT ANGIOPLASTY;  Surgeon: Nada Libman, MD;  Location: [facility] OR;  Service: Vascular;  Laterality: Left;   PERIPHERAL VASCULAR INTERVENTION  10/15/2019   Procedure: PERIPHERAL VASCULAR INTERVENTION;  Surgeon: Nada Libman, MD;  [facility];  Service: Cardiovascular;;  Rt Iliac   REMOVAL OF GRAFT Left 10/04/2020   Procedure: REMOVAL OF LEFT FEMORAL TO POPITEAL BYPASS GRAFT;  Surgeon: Nada Libman, MD;  [facility];  Service: Vascular;  Laterality:  Left;   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   Unilateral AKA, left (HCC) 12/15/2020   Protein-calorie malnutrition, severe 12/09/2020   Wound cellulitis 12/07/2020   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/2020   Ischemia of lower extremity 10/01/2020   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   Femoral-popliteal bypass graft occlusion, 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   PAD (peripheral artery disease) (HCC) 11/13/2019   Callus of foot 07/01/2019   Intermittent claudication (HCC) 01/20/2017   Need for influenza vaccination 01/20/2017   Needs smoking cessation education 09/06/2016   Abnormal echocardiogram 09/05/2016   Abnormal EKG 07/25/2016   Preoperative cardiovascular examination 07/25/2016   Shortness of breath on exertion 07/25/2016   Dyslipidemia, goal LDL below 70 07/25/2016   High blood pressure 07/25/2016   Abnormal chest x-ray 07/04/2016   Peripheral vascular disease (HCC) 07/04/2016   Decreased pedal pulses 06/24/2016   Varicose vein of leg 06/24/2016   Heavy smoker 06/24/2016   Abnormal weight loss 06/24/2016   Foot pain, left 06/24/2016   Screening for prostate cancer 06/24/2016    ONSET DATE: 11/08/2021 (referral date)  REFERRING DIAG: C62.376E (ICD-10-CM) - Unilateral AKA, left (HCC)   THERAPY DIAG:  Muscle weakness (generalized)  Other abnormalities of gait and mobility  Unsteadiness on feet  Abnormal posture  Rationale for Evaluation and Treatment Rehabilitation  SUBJECTIVE:   SUBJECTIVE STATEMENT: Pt is familiar with clinic.  He would like to get leg working better for him and walk better with prosthesis.  Pt accompanied by: self  PERTINENT HISTORY: PMH significant for HTN, HLD, heavy smoking, peripheral artery disease s/p bilateral femoral artery bypass graft    PAIN:  Are you having pain? Yes: NPRS scale: 4/10 Pain location:  L residual limb  Pain description: sore  Aggravating factors: walking Relieving factors: sitting down    PRECAUTIONS: Fall  WEIGHT BEARING RESTRICTIONS No  FALLS: Has patient fallen in last 6 months? No  LIVING ENVIRONMENT: Lives with:  Lives with sister  Lives in: House/apartment Home Access: Stairs to enter Home layout: One level Stairs: Yes: External: 4 front porch steps; can reach both Has following equipment at home: Environmental consultant - 2 wheeled, Wheelchair (manual), and shower chair  PLOF: Independent with basic ADLs, Independent with household mobility with device, and Independent with community mobility with device  PATIENT GOALS "I want to use this leg better."  OBJECTIVE:   DIAGNOSTIC FINDINGS:   COGNITION: Overall cognitive status: Within functional limits for tasks assessed   SENSATION: Light touch: Impaired numbness in L residual limb   POSTURE: No Significant postural limitations  LOWER EXTREMITY ROM:   ROM Right eval Left eval  Hip flexion    Hip extension    Hip abduction    Hip adduction    Hip internal rotation    Hip external rotation    Knee flexion    Knee extension    Ankle  dorsiflexion    Ankle plantarflexion    Ankle inversion    Ankle eversion     (Blank rows = not tested) Grossly WFL   LOWER EXTREMITY MMT:  MMT Right eval Left eval  Hip flexion    Hip extension    Hip abduction    Hip adduction    Hip internal rotation    Hip external rotation    Knee flexion    Knee extension    Ankle dorsiflexion    Ankle plantarflexion    Ankle inversion    Ankle eversion    (Blank rows = not tested) Grossly 3+/5, RLE WFL   TRANSFERS: Sit to stand: SBA and CGA Stand to sit: SBA Stand pivot transfer: CGA   GAIT: Gait pattern: step to pattern, step through pattern, decreased arm swing- Right, decreased arm swing- Left, decreased step length- Right, decreased stride length, and trunk flexed, Pt also vaults on RLE Distance walked: 80'  x 2 reps  Assistive device utilized: Environmental consultant - 2 wheeled and prosthesis Level of assistance: CGA and Min A Gait velocity: 0.43 ft/sec Comments: Pt needs MAX verbal and tactile cues for sequencing and technique.  He needs to do RW, step, RW step pattern so that he can take larger step on RLE.  Note he has max difficulty with L hip extension to lock L knee and needs tactile cues for this most steps.  Cues for heel strike with hip extension to lock knee and then to "ride" over prosthesis with continued forward weight shift to then unlock knee to swing through.    FUNCTIONAL TESTs:  10 meter walk test: 0.43 ft/sec   CARDIOVASCULAR RESPONSE: Functional activity: Following standing to finish tightening leg.  Pre-activity vitals: BP: 121/74 HR:  SpO2:  Post-activity vitals: Checked at end of session as he reports feeling dizzy BP: 98/50, gave snack/soda and increased to 112/61 following 25 mins in clinic with Supervisor HR:  SpO2:  Modified Borg scale for dyspnea:   CURRENT PROSTHETIC WEAR ASSESSMENT: Patient is independent with:  Pt is able to don prosthesis, but still needs max cues for managing clothing and which order to do this in.  He came to session without leg.  PT having to go to car and retrieve and help him don in eval. He reports he is wearing leg some, but mostly uses w/c, esp when going in community.  Patient is dependent with: prosthetic cleaning, ply sock cleaning, correct ply sock adjustment, proper wear schedule/adjustment, and proper weight-bearing schedule/adjustment Donning prosthesis: Min A Doffing prosthesis: Modified independence Prosthetic wear tolerance: Inconsistently wearing Prosthetic weight bearing tolerance: 2-3 minutes Edema: There is swelling in residual limb, educated on use of shrinker and to don liner approx 1 hour before donning prosthesis to help with reducing swelling.  Residual limb condition: Good scar healing, good hair growth Prosthetic description:  Lanyard suspension with extra strap around pelvis, toe pressure unlocks knee K code/activity level with prosthetic use: Level 2     TODAY'S TREATMENT:   TREATMENT:  PATIENT EDUCATED ON FOLLOWING PROSTHETIC CARE: Prosthetic wear tolerance: 2 hours/day, 7 days/week Prosthetic weight bearing tolerance: 2-3 minutes Other education  Correct ply sock adjustment, Propper donning, Proper doffing, Proper wear schedule/adjustment, and Proper weight-bearing schedule/adjustment    PATIENT EDUCATION: Education details: see above, evaluation findings, POC, goals Person educated: Patient Education method: Explanation, Demonstration, and Verbal cues Education comprehension: verbalized understanding, tactile cues required, and needs further education   HOME EXERCISE PROGRAM: None initiated today  ASSESSMENT:  CLINICAL IMPRESSION: Patient is a 61 y.o. male who was seen today for physical therapy evaluation and treatment for L AKA. Pt is familiar to this clinic, having been seen in April-June of this year.  He reports wanting to be more independent with prosthesis however he isn't consistently wearing prosthesis at this time.  He also reports he was [facility] to [facility] and has never done this so PT made appt for him during our evaluation.  He needs MAX verbal and tactile cues for utilizing leg appropriately during gait today.  Will see for short POC (also limited by visits/insurance) to work on making more independent with prosthesis and improving balance.    OBJECTIVE IMPAIRMENTS Abnormal gait, decreased activity tolerance, decreased balance, decreased cognition, decreased endurance, decreased knowledge of condition, decreased knowledge of use of DME, decreased mobility, difficulty walking, decreased strength, impaired perceived functional ability, impaired flexibility, impaired sensation, postural dysfunction, and prosthetic dependency .   ACTIVITY LIMITATIONS carrying, lifting,  bending, sitting, standing, squatting, stairs, transfers, and locomotion level  PARTICIPATION LIMITATIONS: cleaning, laundry, shopping, and community activity  PERSONAL FACTORS Behavior pattern, Past/current experiences, and 3+ comorbidities: HTN, HLD, heavy smoking, B fem a bypass graft, PAD  are also affecting patient's functional outcome.   REHAB POTENTIAL: Fair due to previous rehab episode  CLINICAL DECISION MAKING: Evolving/moderate complexity  EVALUATION COMPLEXITY: Moderate   GOALS: Goals reviewed with patient? Yes  SHORT TERM GOALS: Target date: 12/08/2021    Pt will be IND with initial HEP in order to indicate improved functional mobility and dec fall risk. Baseline:dependent  Goal status: INITIAL  2.  Pt will assess and improve BERG balance score 4 points from baseline in order to indicate dec fall risk.   Baseline: Did not get to assess on eval  Goal status: INITIAL  3.  Pt will improve gait speed to >/=1.0 ft/sec w/ RW in order to indicate dec fall risk.  Baseline: 0.43 ft/sec  Goal status: INITIAL  4.  Pt will ambulate 150' with RW at S level with improved prosthetic WB in order to indicate more independent household ambulation.   Baseline: 31' and needing rest break  Goal status: INITIAL    LONG TERM GOALS: Target date: 01/01/2022    Pt will be IND with final HEP in order to indicate improved functional mobility and dec fall risk. Baseline: dependent  Goal status: INITIAL  2.  Pt will improve BERG balance score 6 points from baseline in order to indicate dec fall risk.   Baseline: did not have time to assess Goal status: INITIAL  3.  Pt will improve gait speed to >/=1.6 ft/sec w/ RW in order to indicate dec fall risk.  Baseline: 0.43 ft/sec with RW Goal status: INITIAL  4.  Pt will negotiate up/down 4 steps with B rails, up/down ramp and curb and ambulate x 200' outdoors over unlevel paved surfaces at S level with RW in order to indicate improved  community mobility.   Baseline: 45' indoors with RW Goal status: INITIAL     PLAN: PT FREQUENCY: 2x/week  PT DURATION: 6 weeks  PLANNED INTERVENTIONS: Therapeutic exercises, Therapeutic activity, Neuromuscular re-education, Balance training, Gait training, Patient/Family education, Self Care, Stair training, Vestibular training, Prosthetic training, DME instructions, and Cognitive remediation  PLAN FOR NEXT SESSION: BERG (update goal if needed), work on standing balance, initiate HEP for standing at sink with weight shifting.  Has appt at [facility] for adjustments 10/17   Harriet Butte, PT, MPT  [facility] [address] [place], [place], [address] Phone: [phone]   Fax:  [phone] 11/17/21, 1:41 PM   Managed medicaid CPT codes: [phone]- Therapeutic Exercise, [phone]- Neuro Re-education, [phone] - Gait Training, [phone] - Therapeutic Activities, [phone] - Self Care, and [phone] - Prosthetic training

## 2021-11-22 ENCOUNTER — Telehealth: Payer: Self-pay

## 2021-11-22 ENCOUNTER — Other Ambulatory Visit: Payer: Self-pay

## 2021-11-22 ENCOUNTER — Other Ambulatory Visit: Payer: Self-pay | Admitting: Physical Medicine and Rehabilitation

## 2021-11-22 MED ORDER — CLONIDINE 0.1 MG/24HR TD PTWK
0.1000 mg | MEDICATED_PATCH | TRANSDERMAL | 11 refills | Status: DC
  Filled 2021-11-22 – 2021-12-24 (×2): qty 4, 28d supply, fill #0
  Filled 2022-01-18: qty 4, 28d supply, fill #1
  Filled 2022-02-28 – 2022-03-03 (×2): qty 4, 28d supply, fill #2

## 2021-11-22 NOTE — Telephone Encounter (Signed)
Mr. Erbe called for a refill on his Clonidine 24 hr patches. He reported changing the patches every 5 days. Patient has been advised per directions he should change it every 7 days. Also to mark the patch when a new one is applied. Please advise.    The pharmacy said he is due for a refill tomorrow.Patient is aware for refill availability.

## 2021-11-23 ENCOUNTER — Other Ambulatory Visit: Payer: Self-pay

## 2021-11-24 ENCOUNTER — Ambulatory Visit: Payer: Medicaid Other | Admitting: Rehabilitation

## 2021-11-26 ENCOUNTER — Other Ambulatory Visit: Payer: Self-pay

## 2021-11-26 ENCOUNTER — Ambulatory Visit: Payer: Medicaid Other

## 2021-11-26 ENCOUNTER — Other Ambulatory Visit (HOSPITAL_COMMUNITY): Payer: Self-pay

## 2021-11-30 ENCOUNTER — Ambulatory Visit: Payer: Medicaid Other | Admitting: Rehabilitation

## 2021-12-01 ENCOUNTER — Encounter (HOSPITAL_COMMUNITY): Payer: Self-pay

## 2021-12-01 ENCOUNTER — Other Ambulatory Visit: Payer: Self-pay

## 2021-12-01 ENCOUNTER — Emergency Department (HOSPITAL_COMMUNITY)
Admission: EM | Admit: 2021-12-01 | Discharge: 2021-12-01 | Discharge status: Against Medical Advice | Payer: Medicaid Other | Attending: Emergency Medicine | Admitting: Emergency Medicine

## 2021-12-01 DIAGNOSIS — Z5321 Procedure and treatment not carried out due to patient leaving prior to being seen by health care provider: Secondary | ICD-10-CM | POA: Insufficient documentation

## 2021-12-01 DIAGNOSIS — R079 Chest pain, unspecified: Secondary | ICD-10-CM | POA: Diagnosis present

## 2021-12-01 DIAGNOSIS — R0602 Shortness of breath: Secondary | ICD-10-CM | POA: Insufficient documentation

## 2021-12-01 LAB — TROPONIN I (HIGH SENSITIVITY): Troponin I (High Sensitivity): 4 ng/L (ref <18)

## 2021-12-01 LAB — CBC
HCT: 37.6 % — ABNORMAL LOW (ref 39.0–52.0)
Hemoglobin: 12.8 g/dL — ABNORMAL LOW (ref 13.0–17.0)
MCH: 23.6 pg — ABNORMAL LOW (ref 26.0–34.0)
MCHC: 34 g/dL (ref 30.0–36.0)
MCV: 69.2 fL — ABNORMAL LOW (ref 80.0–100.0)
Platelets: 308 10*3/uL (ref 150–400)
RBC: 5.43 MIL/uL (ref 4.22–5.81)
RDW: 17.7 % — ABNORMAL HIGH (ref 11.5–15.5)
WBC: 5 10*3/uL (ref 4.0–10.5)
nRBC: 0 % (ref 0.0–0.2)

## 2021-12-01 LAB — BASIC METABOLIC PANEL
Anion gap: 10 (ref 5–15)
BUN: 12 mg/dL (ref 8–23)
CO2: 21 mmol/L — ABNORMAL LOW (ref 22–32)
Calcium: 9.1 mg/dL (ref 8.9–10.3)
Chloride: 107 mmol/L (ref 98–111)
Creatinine, Ser: 1 mg/dL (ref 0.61–1.24)
GFR, Estimated: >60 mL/min (ref >60)
Glucose, Bld: 108 mg/dL — ABNORMAL HIGH (ref 70–99)
Potassium: 3.9 mmol/L (ref 3.5–5.1)
Sodium: 138 mmol/L (ref 135–145)

## 2021-12-01 NOTE — ED Notes (Signed)
Called patient for vital recheck patient didn't answer  

## 2021-12-01 NOTE — ED Triage Notes (Signed)
Patient has bedbugs.  Patient complains of chest pain that started this morning. Reports mild sob, radiating to his back.  Denies n/v or diaphoresis.  Central chest pain.

## 2021-12-01 NOTE — ED Notes (Signed)
Patient refused to stay in room.  Explained he has bedbugs and he can't wander around due to bedbugs patient refused to stay in the room.  Patient stated "I only had one on me today"  I explained even with one they can travel to another person.  Patient left out the lobby.

## 2021-12-03 ENCOUNTER — Other Ambulatory Visit: Payer: Self-pay

## 2021-12-07 ENCOUNTER — Encounter: Payer: Medicaid Other | Admitting: Rehabilitation

## 2021-12-09 ENCOUNTER — Other Ambulatory Visit: Payer: Self-pay | Admitting: Nurse Practitioner

## 2021-12-09 DIAGNOSIS — R748 Abnormal levels of other serum enzymes: Secondary | ICD-10-CM

## 2021-12-10 ENCOUNTER — Ambulatory Visit: Payer: Medicaid Other

## 2021-12-15 ENCOUNTER — Ambulatory Visit: Payer: Medicaid Other | Admitting: Physical Therapy

## 2021-12-17 ENCOUNTER — Ambulatory Visit
Admission: RE | Admit: 2021-12-17 | Discharge: 2021-12-17 | Disposition: A | Discharge status: Home / Self Care | Payer: Medicaid Other | Source: Ambulatory Visit | Attending: Nurse Practitioner | Admitting: Nurse Practitioner

## 2021-12-17 ENCOUNTER — Ambulatory Visit: Payer: Medicaid Other | Attending: Physical Medicine and Rehabilitation

## 2021-12-17 ENCOUNTER — Telehealth: Payer: Self-pay

## 2021-12-17 DIAGNOSIS — R748 Abnormal levels of other serum enzymes: Secondary | ICD-10-CM

## 2021-12-17 NOTE — Telephone Encounter (Signed)
This PT spoke to the patient on the phone at 1300 on 10/27 regarding no-showing his appt for today at 1230. Patient reports forgetting he had the appt. PT reminding patient of no-show/cancel policy. Patient stating he wishes to continue with PT, but needs to get his leg adjusted at [facility]. PT noting to patient that his reason for cancelling his last 4 appts were to go to [employer]. Patient reports having difficulty getting his prosthetic to stay on. PT also reminding patient that he must be able to don his prosthetic for [name] to be able to treat him. Patient verbalized understanding. PT also reminding patient of his next appointment time: Tues 10/31 at 120pm. Patient verbalized understanding.

## 2021-12-18 ENCOUNTER — Emergency Department (HOSPITAL_COMMUNITY)
Admission: EM | Admit: 2021-12-18 | Discharge: 2021-12-18 | Discharge status: Against Medical Advice | Payer: Medicaid Other

## 2021-12-21 ENCOUNTER — Telehealth: Payer: Self-pay | Admitting: Rehabilitation

## 2021-12-21 ENCOUNTER — Ambulatory Visit: Payer: Medicaid Other | Admitting: Rehabilitation

## 2021-12-21 NOTE — Telephone Encounter (Signed)
Pt had an appt 10/31 at 12:30 and did not show up until 12:46 without prosthesis donned.  He reports that he did not get adjustments that day at [facility] (10/17) so will need to make another appt there before returning here.  PT educated that all appts will be cancelled and he will need to call back and schedule once leg has been adjusted.  Pt verbalized understanding.   Cameron Sprang, PT, MPT [facility] [address] [place], [place], [address] Phone: [phone]   Fax:  [phone] 12/21/21, 12:56 PM

## 2021-12-23 ENCOUNTER — Ambulatory Visit: Payer: Medicaid Other

## 2021-12-24 ENCOUNTER — Other Ambulatory Visit: Payer: Self-pay

## 2021-12-28 ENCOUNTER — Ambulatory Visit: Payer: Medicaid Other | Admitting: Rehabilitation

## 2021-12-31 ENCOUNTER — Encounter: Payer: Medicaid Other | Admitting: Physical Therapy

## 2022-01-04 ENCOUNTER — Other Ambulatory Visit: Payer: Self-pay

## 2022-01-04 ENCOUNTER — Encounter
Payer: Medicaid Other | Attending: Physical Medicine and Rehabilitation | Admitting: Physical Medicine and Rehabilitation

## 2022-01-04 VITALS — BP 149/79 | HR 66 | Ht 67.0 in | Wt 169.4 lb

## 2022-01-04 DIAGNOSIS — G546 Phantom limb syndrome with pain: Secondary | ICD-10-CM | POA: Diagnosis not present

## 2022-01-04 DIAGNOSIS — I159 Secondary hypertension, unspecified: Secondary | ICD-10-CM | POA: Diagnosis not present

## 2022-01-04 DIAGNOSIS — G4701 Insomnia due to medical condition: Secondary | ICD-10-CM | POA: Diagnosis not present

## 2022-01-04 DIAGNOSIS — S78112A Complete traumatic amputation at level between left hip and knee, initial encounter: Secondary | ICD-10-CM | POA: Diagnosis not present

## 2022-01-04 MED ORDER — GABAPENTIN 100 MG PO CAPS
100.0000 mg | ORAL_CAPSULE | Freq: Every day | ORAL | 3 refills | Status: DC
  Filled 2022-01-04: qty 90, 90d supply, fill #0

## 2022-01-04 MED ORDER — TOPIRAMATE 25 MG PO TABS
25.0000 mg | ORAL_TABLET | Freq: Every evening | ORAL | 3 refills | Status: DC
  Filled 2022-01-04: qty 30, 30d supply, fill #0

## 2022-01-04 NOTE — Progress Notes (Signed)
Subjective:    Patient ID: Benjamin [name], male    DOB: [birth year], 61 y.o.   MRN: [id number]  HPI Benjamin [name] is a 61 year old man who presents for f/u of left AKA.  1) Tinnitus -occurs at 5am when he takes Losartan  -he is getting all his meds inpackaged form.  -needs to talk with someone about this -has an appointment  2) Left AKA -sometimes hurts when he is laying down -he gets it to the rest and then he feels alright -he is not taking gabapentin -got prosthesis but it is too long and he spoke to [place].  -going to [employer] -pain has not been bad during the day, worst at night  3) Insomnia: -he takes a benadryl sometime -not sleeping that well.  -pain keeps him up at night  Pain Inventory Average Pain 4 Pain Right Now 4 My pain is dull  In the last 24 hours, has pain interfered with the following? General activity 7 Relation with others 0 Enjoyment of life 5 What TIME of day is your pain at its worst? night Sleep (in general) Poor  Pain is worse with:  nothing Pain improves with: medication Relief from Meds: 8  Family History  Problem Relation Age of Onset   Arthritis Mother    COPD Father    Stroke Father    Multiple sclerosis Sister    Social History   Socioeconomic History   Marital status: Single    Spouse name: Not on file   Number of children: Not on file   Years of education: 12   Highest education level: Not on file  Occupational History   Not on file  Tobacco Use   Smoking status: Former    Packs/day: 1.50    Years: 29.00    Total pack years: 43.50    Types: Cigarettes    Quit date: 10/22/2020    Years since quitting: 1.2   Smokeless tobacco: Never   Tobacco comments:    Quit smoking September 2022.  Using Nicoderm patch currently.  Vaping Use   Vaping Use: Never used  Substance and Sexual Activity   Alcohol use: No   Drug use: No   Sexual activity: Not on file  Other Topics Concern   Not on file  Social History Narrative    He works as a Secondary school teacher at [employer].   He lives with his sister and her son.   Walks every day to work at least 15+ minutes.   Social Determinants of Health   Financial Resource Strain: Medium Risk (02/18/2021)   Overall Financial Resource Strain (CARDIA)    Difficulty of Paying Living Expenses: Somewhat hard  Food Insecurity: No Food Insecurity (02/18/2021)   Hunger Vital Sign    Worried About Running Out of Food in the Last Year: Never true    Ran Out of Food in the Last Year: Never true  Transportation Needs: No Transportation Needs (02/18/2021)   PRAPARE - Hydrologist (Medical): No    Lack of Transportation (Non-Medical): No  Physical Activity: Inactive (02/18/2021)   Exercise Vital Sign    Days of Exercise per Week: 0 days    Minutes of Exercise per Session: 0 min  Stress: Stress Concern Present (02/18/2021)   [place]    Feeling of Stress : To some extent  Social Connections: Unknown (02/18/2021)   Social Connection and Isolation Panel [  NHANES]    Frequency of Communication with Friends and Family: More than three times a week    Frequency of Social Gatherings with Friends and Family: More than three times a week    Attends Religious Services: Not on file    Active Member of Clubs or Organizations: Not on file    Attends Archivist Meetings: Not on file    Marital Status: Never married   Past Surgical History:  Procedure Laterality Date   ABDOMINAL AORTOGRAM W/LOWER EXTREMITY N/A 07/12/2016   Procedure: Abdominal Aortogram w/Lower Extremity;  Surgeon: Serafina Mitchell, MD;  Location: [facility];  Service: Cardiovascular;  Laterality: N/A;  Bilateral extermity: Patent Renal As. No sig Dz in infrarenal Abd Aorta. Normal Bilat Iliac arteries. R SFA is 100% @ origin - recon in AK-Pop A. R PT A patent. L CFA occluded. L PFA recon @ origin, L SFA occluded  w/ recon in AK Pop A.   ABDOMINAL AORTOGRAM W/LOWER EXTREMITY N/A 10/15/2019   Procedure: ABDOMINAL AORTOGRAM W/LOWER EXTREMITY;  Surgeon: Serafina Mitchell, MD;  Location: [facility] [facility];  Service: Cardiovascular;  Laterality: N/A;   AMPUTATION Left 10/04/2020   Procedure: LEFT ABOVE KNEE AMPUTATION;  Surgeon: Serafina Mitchell, MD;  Location: [place];  Service: Vascular;  Laterality: Left;   AMPUTATION Left 12/08/2020   Procedure: RIGHT ABOVE KNEE AMPUTATION REVISION;  Surgeon: Angelia Mould, MD;  Location: [facility] OR;  Service: Vascular;  Laterality: Left;   AORTA - BILATERAL FEMORAL ARTERY BYPASS GRAFT N/A 05/08/2020   Procedure: AORTA BIFEMORAL BYPASS GRAFT;  Surgeon: Serafina Mitchell, MD;  [facility];  Service: Vascular;  Laterality: N/A;   COLONOSCOPY  06/2016   never   ENDARTERECTOMY FEMORAL Left 11/14/2019   Procedure: Left groin exploration, Redo left femoral artery exposure;  Surgeon: Serafina Mitchell, MD;  [facility];  Service: Vascular;  Laterality: Left;   FEMORAL-FEMORAL BYPASS GRAFT N/A 11/13/2019   Procedure: BYPASS GRAFT FEMORAL-FEMORAL ARTERY RIGHT TO LEFT USING HEMASHIELD GOLD GRAFT 88mm x 30cm;  Surgeon: Serafina Mitchell, MD;  Location: [facility] OR;  Service: Vascular;  Laterality: N/A;   FEMORAL-POPLITEAL BYPASS GRAFT Left 11/13/2019   Procedure: LEFT FEMORAL BELOW KNEE-POPLITEAL ARTERY USING NON-REVERSED GREATER SAPHENOUS VEIN;  Surgeon: Serafina Mitchell, MD;  [facility];  Service: Vascular;  Laterality: Left;   FEMORAL-POPLITEAL BYPASS GRAFT Left 05/08/2020   Procedure: LEFT REDO BYPASS GRAFT COMMON FEMORAL- BELOW KNEE POPLITEAL ARTERY USING PROPATEN GRAFT;  Surgeon: Serafina Mitchell, MD;  [facility];  Service: Vascular;  Laterality: Left;   I & D EXTREMITY Left 09/07/2020   Procedure: IRRIGATION AND DEBRIDEMENT LEFT LEG WITH EXCISION OF LEFT LEG DISTAL BYPASS GRAFT;  Surgeon: Marty Heck, MD;  Location: [place];  Service: Vascular;  Laterality:  Left;   LOWER EXTREMITY ANGIOGRAM Left 11/14/2019   Procedure: LEFT LOWER EXTREMITY ANGIOGRAM, BYPASS GRAFT ANGIOPLASTY;  Surgeon: Serafina Mitchell, MD;  [facility];  Service: Vascular;  Laterality: Left;   PERIPHERAL VASCULAR INTERVENTION  10/15/2019   Procedure: PERIPHERAL VASCULAR INTERVENTION;  Surgeon: Serafina Mitchell, MD;  Location: [place] [facility];  Service: Cardiovascular;;  Rt Iliac   REMOVAL OF GRAFT Left 10/04/2020   Procedure: REMOVAL OF LEFT FEMORAL TO POPITEAL BYPASS GRAFT;  Surgeon: Serafina Mitchell, MD;  [facility];  Service: Vascular;  Laterality: Left;   Past Surgical History:  Procedure Laterality Date   ABDOMINAL AORTOGRAM W/LOWER EXTREMITY N/A  07/12/2016   Procedure: Abdominal Aortogram w/Lower Extremity;  Surgeon: Nada Libman, MD;  [facility];  Service: Cardiovascular;  Laterality: N/A;  Bilateral extermity: Patent Renal As. No sig Dz in infrarenal Abd Aorta. Normal Bilat Iliac arteries. R SFA is 100% @ origin - recon in AK-Pop A. R PT A patent. L CFA occluded. L PFA recon @ origin, L SFA occluded w/ recon in AK Pop A.   ABDOMINAL AORTOGRAM W/LOWER EXTREMITY N/A 10/15/2019   Procedure: ABDOMINAL AORTOGRAM W/LOWER EXTREMITY;  Surgeon: Nada Libman, MD;  [facility];  Service: Cardiovascular;  Laterality: N/A;   AMPUTATION Left 10/04/2020   Procedure: LEFT ABOVE KNEE AMPUTATION;  Surgeon: Nada Libman, MD;  Location: [facility] OR;  Service: Vascular;  Laterality: Left;   AMPUTATION Left 12/08/2020   Procedure: RIGHT ABOVE KNEE AMPUTATION REVISION;  Surgeon: Chuck Hint, MD;  Location: [facility] OR;  Service: Vascular;  Laterality: Left;   AORTA - BILATERAL FEMORAL ARTERY BYPASS GRAFT N/A 05/08/2020   Procedure: AORTA BIFEMORAL BYPASS GRAFT;  Surgeon: Nada Libman, MD;  [facility];  Service: Vascular;  Laterality: N/A;   COLONOSCOPY  06/2016   never   ENDARTERECTOMY FEMORAL Left 11/14/2019   Procedure: Left  groin exploration, Redo left femoral artery exposure;  Surgeon: Nada Libman, MD;  [facility];  Service: Vascular;  Laterality: Left;   FEMORAL-FEMORAL BYPASS GRAFT N/A 11/13/2019   Procedure: BYPASS GRAFT FEMORAL-FEMORAL ARTERY RIGHT TO LEFT USING HEMASHIELD GOLD GRAFT 77mm x 30cm;  Surgeon: Nada Libman, MD;  Location: [facility] OR;  Service: Vascular;  Laterality: N/A;   FEMORAL-POPLITEAL BYPASS GRAFT Left 11/13/2019   Procedure: LEFT FEMORAL BELOW KNEE-POPLITEAL ARTERY USING NON-REVERSED GREATER SAPHENOUS VEIN;  Surgeon: Nada Libman, MD;  [facility];  Service: Vascular;  Laterality: Left;   FEMORAL-POPLITEAL BYPASS GRAFT Left 05/08/2020   Procedure: LEFT REDO BYPASS GRAFT COMMON FEMORAL- BELOW KNEE POPLITEAL ARTERY USING PROPATEN GRAFT;  Surgeon: Nada Libman, MD;  [facility];  Service: Vascular;  Laterality: Left;   I & D EXTREMITY Left 09/07/2020   Procedure: IRRIGATION AND DEBRIDEMENT LEFT LEG WITH EXCISION OF LEFT LEG DISTAL BYPASS GRAFT;  Surgeon: Cephus Shelling, MD;  [facility];  Service: Vascular;  Laterality: Left;   LOWER EXTREMITY ANGIOGRAM Left 11/14/2019   Procedure: LEFT LOWER EXTREMITY ANGIOGRAM, BYPASS GRAFT ANGIOPLASTY;  Surgeon: Nada Libman, MD;  [facility];  Service: Vascular;  Laterality: Left;   PERIPHERAL VASCULAR INTERVENTION  10/15/2019   Procedure: PERIPHERAL VASCULAR INTERVENTION;  Surgeon: Nada Libman, MD;  [facility];  Service: Cardiovascular;;  Rt Iliac   REMOVAL OF GRAFT Left 10/04/2020   Procedure: REMOVAL OF LEFT FEMORAL TO POPITEAL BYPASS GRAFT;  Surgeon: Nada Libman, MD;  [facility];  Service: Vascular;  Laterality: Left;   Past Medical History:  Diagnosis Date   Back pain    Diabetes mellitus without complication (HCC)    Heavy smoker    Hyperlipidemia LDL goal <70    Hypertension    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   Bilateral femoral artery disease   BP  (!) 149/79   Pulse 66   Ht 5\' 7"  (1.702 m)   Wt 169 lb 6.4 oz (76.8 kg)   SpO2 92%   BMI 26.53 kg/m   Opioid Risk Score:   Fall Risk Score:  `1  Depression screen [facility] 2/9  03/16/2021    2:06 PM 02/18/2021   10:10 AM 07/22/2020   10:30 AM 07/01/2019    9:08 AM 05/15/2019    9:35 AM 01/20/2017    8:37 AM  Depression screen PHQ 2/9  Decreased Interest 2 1 0 1 1 0  Down, Depressed, Hopeless 0 1 0 0 1 0  PHQ - 2 Score 2 2 0 1 2 0  Altered sleeping 1   2 3    Tired, decreased energy 1   1 1    Change in appetite 0   0 2   Feeling bad or failure about yourself  1   0 2   Trouble concentrating 0   0 0   Moving slowly or fidgety/restless 0   0 0   Suicidal thoughts 0   0 0   PHQ-9 Score 5   4 10    Difficult doing work/chores Somewhat difficult   Not difficult at all Extremely dIfficult       Review of Systems  Musculoskeletal:        Stump  All other systems reviewed and are negative.      Objective:   Physical Exam Gen: no distress, normal appearing, BMI 26.53, weight 169 lbs, BP 149/79 HEENT: oral mucosa pink and moist, NCAT Cardio: Reg rate Chest: normal effort, normal rate of breathing Abd: soft, non-distended Ext: no edema Psych: pleasant, normal affect Skin: intact Neuro: Alert and oriented x3 Musculoskeletal: Left AKA, using RW to ambulate    Assessment & Plan:   1) Left AKA -encouraged follow-up with [facility] for new prosthesis -referred to PT -continue multivitamin -would benefit from power wheelchair- this would help him to be more active -continue RW for now  2) HTN: -commended on improved BP control -discontinue amlodipine -continue clonidine patch also helps with neuropathic pain -continue carvedilol -BP is 149/79 today.  -Advised checking BP daily at home and logging results to bring into follow-up appointment with PCP and myself. -Reviewed BP meds today.  -Advised regarding healthy foods that can help lower blood pressure and provided  with a list: 1) citrus foods- high in vitamins and minerals 2) salmon and other fatty fish - reduces inflammation and oxylipins 3) swiss chard (leafy green)- high level of nitrates 4) pumpkin seeds- one of the best natural sources of magnesium 5) Beans and lentils- high in fiber, magnesium, and potassium 6) Berries- high in flavonoids 7) Amaranth (whole grain, can be cooked similarly to rice and oats)- high in magnesium and fiber 8) Pistachios- even more effective at reducing BP than other nuts 9) Carrots- high in phenolic compounds that relax blood vessels and reduce inflammation 10) Celery- contain phthalides that relax tissues of arterial walls 11) Tomatoes- can also improve cholesterol and reduce risk of heart disease 12) Broccoli- good source of magnesium, calcium, and potassium 13) Greek yogurt: high in potassium and calcium 14) Herbs and spices: Celery seed, cilantro, saffron, lemongrass, black cumin, ginseng, cinnamon, cardamom, sweet basil, and ginger 15) Chia and flax seeds- also help to lower cholesterol and blood sugar 16) Beets- high levels of nitrates that relax blood vessels  17) spinach and bananas- high in potassium  -Provided lise of supplements that can help with hypertension:  1) magnesium: one high quality brand is Bioptemizers since it contains all 7 types of magnesium, otherwise over the counter magnesium gluconate 400mg  is a good option 2) B vitamins 3) vitamin D 4) potassium 5) CoQ10 6) L-arginine 7) Vitamin C 8) Beetroot -Educated that goal  BP is 120/80. -Made goal to incorporate some of the above foods into diet.    3) Tinnitus: -discussed that seemed to be associated with gabapentin so not to take this medicine.  -appears to be associated with blood pressure medication, discussed decreasing Coreg to 6.25mg  BID given improved BP control, sent new script -blood pressure is well controlled.  -referred to audiology in [place]  4) Chronic Pain Syndrome  secondary to left AKA -start Topamax 25mg  HS -Discussed current symptoms of pain and history of pain.  -Discussed benefits of exercise in reducing pain. -Discussed following foods that may reduce pain: 1) Ginger (especially studied for arthritis)- reduce leukotriene production to decrease inflammation 2) Blueberries- high in phytonutrients that decrease inflammation 3) Salmon- marine omega-3s reduce joint swelling and pain 4) Pumpkin seeds- reduce inflammation 5) dark chocolate- reduces inflammation 6) turmeric- reduces inflammation 7) tart cherries - reduce pain and stiffness 8) extra virgin olive oil - its compound olecanthal helps to block prostaglandins  9) chili peppers- can be eaten or applied topically via capsaicin 10) mint- helpful for headache, muscle aches, joint pain, and itching 11) garlic- reduces inflammation  Link to further information on diet for chronic pain: [email or web address].[email or web address]    5) Insomnia: -start topamax 25mg  HS. Discussed to take only when needed -Try to go outside near sunrise -Get exercise during the day.  -Turn off all devices an hour before bedtime.  -Teas that can benefit: chamomile, valerian root, Brahmi (Bacopa) -Can consider over the counter melatonin, magnesium, and/or L-theanine. Melatonin is an anti-oxidant with multiple health benefits. Magnesium is involved in greater than 300 enzymatic reactions in the body and most of [name] are deficient as our soil is often depleted. There are 7 different types of magnesium- Bioptemizer's is a supplement with all 7 types, and each has unique benefits. Magnesium can also help with constipation and anxiety.  -Pistachios naturally increase the production of melatonin -[employer] bamboo bed sheets are free from toxic chemicals.  -Tart cherry juice or a tart cherry supplement can improve sleep and soreness post-workout  6)  HLD:  -can discuss with  PCP risks and benefits of decreasing statin dose given his fatigue and LDL level 47 -advised taking Coenzyme Q10 in the meantime to help improve his energy.  Provided with list of supplements that can help with dyslipidemia: 1) Vitamin B3 500-4,000mg  in divided doses daily (would recommend starting low as can cause uncomfortable facial flushing if started at too high a dose) 2) Phytosterols 2.15 grams daily 3) Fermented soy 30-50 grams daily 4) EGCG (found in green tea): 500-1000mg  daily 5) Omega-3 fatty acids 3000-5,000mg  daily 6) Flax seed 40 grams daily 7) Monounsaturated fats 20-40 grams daily (olives, olive oil, nuts), also reduces cardiovascular disease 8) Sesame: 40 grams daily 9) Gamma/delta tocotrienols- a family of unsaturated forms of Vitamin E- 200mg  with dinner 10) Pantethine 900mg  daily in divided doses 11) Resveratrol 250mg  daily 12) N Acetyl Cysteine 2000mg  daily in divided doses 13) Curcumin 2000-5000mg  in divided doses daily 14) Pomegranate juice: 8 ounces daily, also helps to lower blood pressure 15) Pomegranate seeds one cup daily, also helps to lower blood pressure 16) Citrus Bergamot 1000mg  daily, also helps with glucose control and weight loss 17) Vitamin C 500mg  daily 18) Quercetin 500-1000mg  daily 19) Glutathione 20) Probiotics 60-100 billion organisms per day 21) Fiber 22) Oats 23) Aged garlic (can eat as food or supplement of 600-900mg  per day) 24) Chia seeds 25 grams per day 25) Lycopene- carotenoid found in  high concentrations in tomatoes. 26) Alpha linolenic acid 27) Flavonoids and anthocyanins 28) Wogonin- flavanoid that enhances reverse cholesterol transport 29) Coenzyme Q10 30) Pantethine- derivative of Vitamin B5: 300mg  three times per day or 450mg  twice per day with or without food 31) Barley and other whole grains 32) Orange juice 33) L- carnitine 34) L- Lysine 35) L- Arginine 36) Almonds 37) Morin 38) Rutin 39) Carnosine 40) Histidine   41) Kaempferol  42) Organosulfur compounds 43) Vitamin E 44) Oleic acid 45) RBO (ferulic acid gammaoryzanol) 46) grape seed extract 47) Red wine 48) Berberine HCL 500mg  daily or twice per day- more effective and with fewer adverse effects that ezetimibe monotherapy 49) red yeast rice 2400- 4800 mg/day 50) chlorella 51) Licorice

## 2022-01-04 NOTE — Patient Instructions (Signed)
HTN: -Advised checking BP daily at home and logging results to bring into follow-up appointment with PCP and myself. -Reviewed BP meds today.  -Advised regarding healthy foods that can help lower blood pressure and provided with a list: 1) citrus foods- high in vitamins and minerals 2) salmon and other fatty fish - reduces inflammation and oxylipins 3) swiss chard (leafy green)- high level of nitrates 4) pumpkin seeds- one of the best natural sources of magnesium 5) Beans and lentils- high in fiber, magnesium, and potassium 6) Berries- high in flavonoids 7) Amaranth (whole grain, can be cooked similarly to rice and oats)- high in magnesium and fiber 8) Pistachios- even more effective at reducing BP than other nuts 9) Carrots- high in phenolic compounds that relax blood vessels and reduce inflammation 10) Celery- contain phthalides that relax tissues of arterial walls 11) Tomatoes- can also improve cholesterol and reduce risk of heart disease 12) Broccoli- good source of magnesium, calcium, and potassium 13) Greek yogurt: high in potassium and calcium 14) Herbs and spices: Celery seed, cilantro, saffron, lemongrass, black cumin, ginseng, cinnamon, cardamom, sweet basil, and ginger 15) Chia and flax seeds- also help to lower cholesterol and blood sugar 16) Beets- high levels of nitrates that relax blood vessels  17) spinach and bananas- high in potassium  -Provided lise of supplements that can help with hypertension:  1) magnesium: one high quality brand is Bioptemizers since it contains all 7 types of magnesium, otherwise over the counter magnesium gluconate 400mg is a good option 2) B vitamins 3) vitamin D 4) potassium 5) CoQ10 6) L-arginine 7) Vitamin C 8) Beetroot -Educated that goal BP is 120/80. -Made goal to incorporate some of the above foods into diet.   

## 2022-01-05 ENCOUNTER — Encounter: Payer: Medicaid Other | Admitting: Rehabilitation

## 2022-01-10 ENCOUNTER — Other Ambulatory Visit: Payer: Self-pay

## 2022-01-18 ENCOUNTER — Other Ambulatory Visit: Payer: Self-pay

## 2022-01-26 ENCOUNTER — Other Ambulatory Visit: Payer: Self-pay

## 2022-02-10 ENCOUNTER — Ambulatory Visit (HOSPITAL_COMMUNITY)
Admission: RE | Admit: 2022-02-10 | Discharge: 2022-02-10 | Disposition: A | Discharge status: Home / Self Care | Payer: Medicaid Other | Source: Ambulatory Visit | Attending: Vascular Surgery | Admitting: Vascular Surgery

## 2022-02-10 DIAGNOSIS — I739 Peripheral vascular disease, unspecified: Secondary | ICD-10-CM | POA: Diagnosis present

## 2022-02-11 ENCOUNTER — Ambulatory Visit: Payer: Medicaid Other | Admitting: Physician Assistant

## 2022-02-11 VITALS — BP 121/74 | HR 67 | Temp 97.8°F | Ht 67.0 in | Wt 173.0 lb

## 2022-02-11 DIAGNOSIS — I739 Peripheral vascular disease, unspecified: Secondary | ICD-10-CM | POA: Diagnosis not present

## 2022-02-11 NOTE — Progress Notes (Signed)
[facility]  HISTORY AND PHYSICAL   HPI:   This is a 61 y.o. male who is here for follow up.   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  This was performed on 12/08/2020.    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  He denise right LE rest pain or non healing ulcers.  He has no new complaints.  He denies short distance claudication on the right LE when he ambulates with his walker at home.    He is medically managed on Daily Statin and Plavix.    Past Medical History:  Diagnosis Date   Back pain    Diabetes mellitus without complication (HCC)    Heavy smoker    Hyperlipidemia LDL goal <70    Hypertension    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   Bilateral femoral artery disease    Past Surgical History:  Procedure Laterality Date   ABDOMINAL AORTOGRAM W/LOWER EXTREMITY N/A 07/12/2016   Procedure: Abdominal Aortogram w/Lower Extremity;  Surgeon: Nada Libman, MD;  Location: [facility];  Service: Cardiovascular;  Laterality: N/A;  Bilateral extermity: Patent Renal As. No sig Dz in infrarenal Abd Aorta. Normal Bilat Iliac arteries. R SFA is 100% @ origin - recon in AK-Pop A. R PT A patent. L CFA occluded. L PFA recon @ origin, L SFA occluded w/ recon in AK Pop A.   ABDOMINAL AORTOGRAM W/LOWER EXTREMITY N/A 10/15/2019   Procedure: ABDOMINAL AORTOGRAM W/LOWER EXTREMITY;  Surgeon: Nada Libman, MD;  Location: [facility];  Service: Cardiovascular;  Laterality: N/A;   AMPUTATION Left 10/04/2020   Procedure: LEFT ABOVE KNEE AMPUTATION;  Surgeon: Nada Libman, MD;  Location: [facility] OR;  Service: Vascular;  Laterality: Left;   AMPUTATION Left 12/08/2020   Procedure: RIGHT ABOVE KNEE AMPUTATION REVISION;  Surgeon: Chuck Hint, MD;  Location: [facility] OR;  Service: Vascular;  Laterality: Left;   AORTA - BILATERAL FEMORAL  ARTERY BYPASS GRAFT N/A 05/08/2020   Procedure: AORTA BIFEMORAL BYPASS GRAFT;  Surgeon: Nada Libman, MD;  Location: [facility] OR;  Service: Vascular;  Laterality: N/A;   COLONOSCOPY  06/2016   never   ENDARTERECTOMY FEMORAL Left 11/14/2019   Procedure: Left groin exploration, Redo left femoral artery exposure;  Surgeon: Nada Libman, MD;  Location: [facility] OR;  Service: Vascular;  Laterality: Left;   FEMORAL-FEMORAL BYPASS GRAFT N/A 11/13/2019   Procedure: BYPASS GRAFT FEMORAL-FEMORAL ARTERY RIGHT TO LEFT USING HEMASHIELD GOLD GRAFT 16mm x 30cm;  Surgeon: Nada Libman, MD;  Location: [facility] OR;  Service: Vascular;  Laterality: N/A;   FEMORAL-POPLITEAL BYPASS GRAFT Left 11/13/2019   Procedure: LEFT FEMORAL BELOW KNEE-POPLITEAL ARTERY USING NON-REVERSED GREATER SAPHENOUS VEIN;  Surgeon: Nada Libman, MD;  Location: [facility] OR;  Service: Vascular;  Laterality: Left;   FEMORAL-POPLITEAL BYPASS GRAFT Left 05/08/2020   Procedure: LEFT REDO BYPASS GRAFT COMMON FEMORAL- BELOW KNEE POPLITEAL ARTERY USING PROPATEN GRAFT;  Surgeon: Nada Libman, MD;  Location: [facility] OR;  Service: Vascular;  Laterality: Left;   I & D EXTREMITY Left 09/07/2020   Procedure: IRRIGATION AND DEBRIDEMENT LEFT LEG WITH EXCISION OF LEFT LEG DISTAL BYPASS GRAFT;  Surgeon: Cephus Shelling, MD;  Location: [facility] OR;  Service: Vascular;  Laterality: Left;   LOWER EXTREMITY ANGIOGRAM Left 11/14/2019   Procedure: LEFT LOWER EXTREMITY ANGIOGRAM, BYPASS GRAFT ANGIOPLASTY;  Surgeon:  Nada LibmanBrabham, Vance W, MD;  [facility];  Service: Vascular;  Laterality: Left;   PERIPHERAL VASCULAR INTERVENTION  10/15/2019   Procedure: PERIPHERAL VASCULAR INTERVENTION;  Surgeon: Nada LibmanBrabham, Vance W, MD;  Location: [facility];  Service: Cardiovascular;;  Rt Iliac   REMOVAL OF GRAFT Left 10/04/2020   Procedure: REMOVAL OF LEFT FEMORAL TO POPITEAL BYPASS GRAFT;  Surgeon: Nada LibmanBrabham, Vance W, MD;  Location: [facility] OR;  Service: Vascular;  Laterality: Left;    ROS:    General:  No weight loss, Fever, chills  HEENT: No recent headaches, no nasal bleeding, no visual changes, no sore throat  Neurologic: No dizziness, blackouts, seizures. No recent symptoms of stroke or mini- stroke. No recent episodes of slurred speech, or temporary blindness.  Cardiac: No recent episodes of chest pain/pressure, no shortness of breath at rest.  No shortness of breath with exertion.  Denies history of atrial fibrillation or irregular heartbeat  Vascular: No history of rest pain in feet.  No history of claudication.  positive history of non-healing ulcer, No history of DVT   Pulmonary: No home oxygen, no productive cough, no hemoptysis,  No asthma or wheezing  Musculoskeletal:  [ ]  Arthritis, [ ]  Low back pain,  [ ]  Joint pain  Hematologic:No history of hypercoagulable state.  No history of easy bleeding.  No history of anemia  Gastrointestinal: No hematochezia or melena,  No gastroesophageal reflux, no trouble swallowing  Urinary: [ ]  chronic Kidney disease, [ ]  on HD - [ ]  MWF or [ ]  TTHS, [ ]  Burning with urination, [ ]  Frequent urination, [ ]  Difficulty urinating;   Skin: No rashes  Psychological: No history of anxiety,  No history of depression  Social History Social History   Tobacco Use   Smoking status: Former    Packs/day: 1.50    Years: 29.00    Total pack years: 43.50    Types: Cigarettes    Quit date: 10/22/2020    Years since quitting: 1.3   Smokeless tobacco: Never   Tobacco comments:    Quit smoking September 2022.  Using Nicoderm patch currently.  Vaping Use   Vaping Use: Never used  Substance Use Topics   Alcohol use: No   Drug use: No    Family History Family History  Problem Relation Age of Onset   Arthritis Mother    COPD Father    Stroke Father    Multiple sclerosis Sister     Allergies  Allergies  Allergen Reactions   Gabapentin Other (See Comments)    Causes lethargy   Tetanus Toxoids Other (See Comments)    Tinnitus  started after he took this   Aspirin Nausea And Vomiting   Garlic Other (See Comments)    Sneezing      Current Outpatient Medications  Medication Sig Dispense Refill   acetaminophen (TYLENOL) 325 MG tablet Take 1-2 tablets (325-650 mg total) by mouth every 4 (four) hours as needed for mild pain. 200 tablet 0   albuterol (VENTOLIN HFA) 108 (90 Base) MCG/ACT inhaler Inhale 1-2 puffs into the lungs every 6 (six) hours as needed for wheezing or shortness of breath. 18 g 0   atorvastatin (LIPITOR) 20 MG tablet Take 1 tablet (20 mg total) by mouth at bedtime. (Patient taking differently: Take 20 mg by mouth daily.) 90 tablet 1   carvedilol (COREG) 6.25 MG tablet Take 1 tablet (6.25 mg total) by mouth 2 (two) times daily with a meal. 120 tablet 3  cloNIDine (CATAPRES - DOSED IN MG/24 HR) 0.1 mg/24hr patch Place 1 patch (0.1 mg total) onto the skin once a week. 4 patch 12   cloNIDine (CATAPRES - DOSED IN MG/24 HR) 0.1 mg/24hr patch Place 1 patch (0.1 mg total) onto the skin once every 7 (seven) days. 1 patch 0   cloNIDine (CATAPRES - DOSED IN MG/24 HR) 0.1 mg/24hr patch Place 1 patch (0.1 mg total) onto the skin once a week. 4 patch 11   clopidogrel (PLAVIX) 75 MG tablet Take 1 tablet (75 mg total) by mouth daily. 30 tablet 0   clopidogrel (PLAVIX) 75 MG tablet Take 1 tablet by mouth daily.     diphenhydrAMINE (BENADRYL) 25 MG tablet Take 25 mg by mouth every 6 (six) hours as needed for allergies.     hydrOXYzine (ATARAX) 25 MG tablet Take 1 tablet (25 mg total) by mouth at bedtime as needed (for sleep.). 21 tablet 0   isosorbide mononitrate (IMDUR) 30 MG 24 hr tablet Take 1 tablet (30 mg total) by mouth once daily. 30 tablet 4   losartan (COZAAR) 50 MG tablet Take 1 tablet (50 mg total) by mouth in the morning and at bedtime. 180 tablet 1   methocarbamol (ROBAXIN) 500 MG tablet Take 1 tablet (500 mg total) by mouth every 6 (six) hours as needed for muscle spasms. 30 tablet 0   metoprolol tartrate  (LOPRESSOR) 100 MG tablet Take 1 tablet 2 hours prior to procedure. 1 tablet 0   nitroGLYCERIN (NITROSTAT) 0.4 MG SL tablet Place 1 tablet (0.4 mg total) under the tongue every 5 (five) minutes as needed for chest pain. 25 tablet 4   senna (SENOKOT) 8.6 MG TABS tablet Take 1 tablet (8.6 mg total) by mouth 2 (two) times daily. 60 tablet 0   topiramate (TOPAMAX) 25 MG tablet Take 1 tablet (25 mg total) by mouth at bedtime. 30 tablet 3   No current facility-administered medications for this visit.    Physical Examination  Vitals:   02/11/22 1414  BP: 121/74  Pulse: 67  Temp: 97.8 F (36.6 C)  TempSrc: Temporal  SpO2: 94%  Weight: 173 lb (78.5 kg)  Height: 5\' 7"  (1.702 m)    Body mass index is 27.1 kg/m.  General:  Alert and oriented, no acute distress HEENT: Normal Neck: No bruit or JVD Pulmonary: Clear to auscultation bilaterally Cardiac: Regular Rate and Rhythm without murmur Abdomen: Soft, non-tender, non-distended, no mass, no scars Skin: No rash, left AKA well healed, right Foot warm well perfused without ischemic skin changes Musculoskeletal: No deformity or edema  Neurologic: Upper and lower extremity motor grossly intact and symmetric  DATA:  ABI Findings:  +---------+------------------+-----+--------+--------+  Right   Rt Pressure (mmHg)IndexWaveformComment   +---------+------------------+-----+--------+--------+  Brachial 141                                      +---------+------------------+-----+--------+--------+  PTA     115               0.82 biphasic          +---------+------------------+-----+--------+--------+  DP      117               0.83 biphasic          +---------+------------------+-----+--------+--------+  Great Toe80                0.57                   +---------+------------------+-----+--------+--------+   +--------+------------------+-----+--------+-------+  Left   Lt Pressure  (mmHg)IndexWaveformComment  +--------+------------------+-----+--------+-------+  [id number]                                    +--------+------------------+-----+--------+-------+  PTA                                   AKA      +--------+------------------+-----+--------+-------+  DP                                    AKA      +--------+------------------+-----+--------+-------+   +-------+-----------+-----------+------------+------------+  ABI/TBIToday's ABIToday's TBIPrevious ABIPrevious TBI  +-------+-----------+-----------+------------+------------+  Right 0.83       0.57       0.50        0.46          +-------+-----------+-----------+------------+------------+  Left  AKA        AKA        AKA         AKA           +-------+-----------+-----------+------------+------------+       Right ABIs and TBIs appear increased compared to prior study on  04/26/2021.    Summary:  Right: Resting right ankle-brachial index indicates mild right lower  extremity arterial disease. The right toe-brachial index is abnormal.   ASSESSMENT/PLAN:  PAD s/p left non healing wound with no revascularization options now left AKA.  The right LE is stable with Biphasic wave forms and asymptomatic for rest pain or non healing wounds.   We reviewed symptoms of ischemia and if he develops these he will call our office.  Continue daily Statin and Plavix.  F/U for repeat ABI of the right LE in 1 year.     Mosetta Pigeon PA-C [facility] [place] Office: [phone]  MD in clinic [place]

## 2022-02-28 ENCOUNTER — Other Ambulatory Visit: Payer: Self-pay

## 2022-03-09 ENCOUNTER — Other Ambulatory Visit: Payer: Self-pay

## 2022-03-09 ENCOUNTER — Ambulatory Visit: Payer: Medicaid Other | Attending: Physical Medicine and Rehabilitation

## 2022-03-09 DIAGNOSIS — R2681 Unsteadiness on feet: Secondary | ICD-10-CM | POA: Insufficient documentation

## 2022-03-09 DIAGNOSIS — M6281 Muscle weakness (generalized): Secondary | ICD-10-CM | POA: Diagnosis not present

## 2022-03-09 DIAGNOSIS — R2689 Other abnormalities of gait and mobility: Secondary | ICD-10-CM | POA: Insufficient documentation

## 2022-03-09 NOTE — Therapy (Addendum)
OUTPATIENT PHYSICAL THERAPY WHEELCHAIR EVALUATION   Patient Name: Benjamin [name] MRN: [id number] DOB:February 12, 1961, 62 y.o., male Today's Date: 03/09/2022  END OF SESSION:  PT End of Session - 03/09/22 1403     Visit Number 1    Number of Visits 1    Authorization Type [employer]    Activity Tolerance Patient tolerated treatment well    Behavior During Therapy [facility] for tasks assessed/performed             Past Medical History:  Diagnosis Date   Back pain    Diabetes mellitus without complication (HCC)    Heavy smoker    Hyperlipidemia LDL goal <70    Hypertension    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 No sig Dz in infrarenal Abd Aorta. Normal Bilat Iliac arteries. R SFA is 100% @ origin - recon in AK-Pop A. R PT A patent. L CFA occluded. L PFA recon @ origin, L SFA occluded w/ recon in AK Pop A.   ABDOMINAL AORTOGRAM W/LOWER EXTREMITY N/A 10/15/2019   Procedure: ABDOMINAL AORTOGRAM W/LOWER EXTREMITY;  Surgeon: Nada Libman, MD;  Location: [facility];  Service: Cardiovascular;  Laterality: N/A;   AMPUTATION Left 10/04/2020   Procedure: LEFT ABOVE KNEE AMPUTATION;  Surgeon: Nada Libman, MD;  Location: [facility] OR;  Service: Vascular;  Laterality: Left;   AMPUTATION Left 12/08/2020   Procedure: RIGHT ABOVE KNEE AMPUTATION REVISION;  Surgeon: Chuck Hint, MD;  Location: [facility] OR;  Service: Vascular;  Laterality: Left;   AORTA - BILATERAL FEMORAL ARTERY BYPASS GRAFT N/A 05/08/2020   Procedure: AORTA BIFEMORAL BYPASS GRAFT;  Surgeon: Nada Libman, MD;  Location: [facility] OR;  Service: Vascular;  Laterality:  N/A;   COLONOSCOPY  06/2016   never   ENDARTERECTOMY FEMORAL Left 11/14/2019   Procedure: Left groin exploration, Redo left femoral artery exposure;  Surgeon: Nada Libman, MD;  Location: [facility] OR;  Service: Vascular;  Laterality: Left;   FEMORAL-FEMORAL BYPASS GRAFT N/A 11/13/2019   Procedure: BYPASS GRAFT FEMORAL-FEMORAL ARTERY RIGHT TO LEFT USING HEMASHIELD GOLD GRAFT 8mm x 30cm;  Surgeon: Nada Libman, MD;  Location: [facility] OR;  Service: Vascular;  Laterality: N/A;   FEMORAL-POPLITEAL BYPASS GRAFT Left 11/13/2019   Procedure: LEFT FEMORAL BELOW KNEE-POPLITEAL ARTERY USING NON-REVERSED GREATER SAPHENOUS VEIN;  Surgeon: Nada Libman, MD;  Location: [facility] OR;  Service: Vascular;  Laterality: Left;   FEMORAL-POPLITEAL BYPASS GRAFT Left 05/08/2020   Procedure: LEFT REDO BYPASS GRAFT COMMON FEMORAL- BELOW KNEE POPLITEAL ARTERY USING PROPATEN GRAFT;  Surgeon: Nada Libman, MD;  Location: [facility] OR;  Service: Vascular;  Laterality: Left;   I & D EXTREMITY Left 09/07/2020   Procedure: IRRIGATION AND DEBRIDEMENT LEFT LEG WITH EXCISION OF LEFT LEG DISTAL BYPASS GRAFT;  Surgeon: Cephus Shelling, MD;  Location: [facility] OR;  Service: Vascular;  Laterality: Left;   LOWER EXTREMITY ANGIOGRAM Left 11/14/2019   Procedure: LEFT LOWER EXTREMITY ANGIOGRAM, BYPASS GRAFT ANGIOPLASTY;  Surgeon: Nada Libman, MD;  Location: [facility] OR;  Service: Vascular;  Laterality: Left;   PERIPHERAL VASCULAR INTERVENTION  10/15/2019   Procedure: PERIPHERAL VASCULAR INTERVENTION;  Surgeon: Nada Libman, MD;  [facility]: [facility];  Service: Cardiovascular;;  Rt Iliac   REMOVAL OF GRAFT Left 10/04/2020   Procedure: REMOVAL OF LEFT FEMORAL TO POPITEAL BYPASS GRAFT;  Surgeon: Nada Libman, MD;  Location: [facility] OR;  Service: Vascular;  Laterality: Left;   Patient Active Problem List   Diagnosis Date Noted   Acute blood loss anemia 12/25/2020   Hyponatremia 12/25/2020   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   Unilateral AKA, left (HCC) 12/15/2020   Protein-calorie malnutrition, severe 12/09/2020   Wound cellulitis 12/07/2020   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/2020   Ischemia of lower extremity 10/01/2020   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   Femoral-popliteal bypass graft occlusion, 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   PAD (peripheral artery disease) (HCC) 11/13/2019   Callus of foot 07/01/2019   Intermittent claudication (HCC) 01/20/2017   Need for influenza vaccination 01/20/2017   Needs smoking cessation education 09/06/2016   Abnormal echocardiogram 09/05/2016   Abnormal EKG 07/25/2016   Preoperative cardiovascular examination 07/25/2016   Shortness of breath on exertion 07/25/2016   Dyslipidemia, goal LDL below 70 07/25/2016   High blood pressure 07/25/2016   Abnormal chest x-ray 07/04/2016   Peripheral vascular disease (HCC) 07/04/2016   Decreased pedal pulses 06/24/2016   Varicose vein of leg 06/24/2016   Heavy smoker 06/24/2016   Abnormal weight loss 06/24/2016   Foot pain, left 06/24/2016   Screening for prostate cancer 06/24/2016    PCP: Dr. Levonne Lapping  REFERRING PROVIDER: Dr. Sula Soda  THERAPY DIAG:  Muscle weakness (generalized)  Other abnormalities of gait and mobility  Unsteadiness on feet  Rationale for Evaluation and Treatment Rehabilitation  SUBJECTIVE:                                                                                                                                                                                           SUBJECTIVE STATEMENT: Pt present or wheelchair evaluation. Pt had his initial AKA done on 10/04/20 but he had non healing wound and doctors had to revise surgery to shorten his residual limb which was done on 12/08/2020. Pt has manual wheelchair but it is hard to push it around the house because it is carpeted all over. Pt reports he has ramp in the back of the house and stairs in the front. He lives with his sister. Pt  did not bring his prosthetic leg with him today for his appt. He spends all day sitting in wheelchair. He uses walker to go from bathroom door toilet as he can't get wheelchair inside. He doesn't use his prosthetic leg because it is cumbersome to put on and  can make his leg sore.   PRECAUTIONS: Fall  WEIGHT BEARING RESTRICTIONS No    OCCUPATION: diability  PLOF:  Needs assistance with ADLs and Needs assistance with homemaking  PATIENT GOALS: Get a power chair         MEDICAL HISTORY:  Primary diagnosis onset: 12/08/2020 Diagnosis  Code:  Diagnosis:    Diagnosis code:       Diagnosis:  S78.112A (ICD-10-CM) - Unilateral AKA, left (HCC)  Diagnosis  Code:  Diagnosis:   [] Progressive disease  Relevant future surgeries:     Height: 5\' 8"  Weight: 160 lbs Explain recent changes or trends in weight:      History:  Past Medical History:  Diagnosis Date   Back pain    Diabetes mellitus without complication (HCC)    Heavy smoker    Hyperlipidemia LDL goal <70    Hypertension    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   Bilateral femoral artery disease            Cardio Status:  Functional Limitations:   [x] Intact  []  Impaired      Respiratory Status:  Functional Limitations:   [x] Intact  [] Impaired   [] SOB [] COPD [] O2 Dependent ______LPM  [] Ventilator Dependent  Resp equip:                                                     Objective Measure(s):   Orthotics:   [x] Amputee:         L AKA                                                    [x] Prosthesis:        HOME ENVIRONMENT:  [x] House [] Condo/town home [] Apartment [] Asst living [] LTCF         [] Own  [] Rent   [] Lives alone [x] Lives with others -      sister, her son and his girlfriend                        Hours without assistance: 0  [x] Home is accessible to patient                                 Storage of wheelchair:  [x] In home   [] Other Comments:        COMMUNITY :  TRANSPORTATION:  [x] Car [] Van  [] Public Transportation [] Adapted w/c Lift []  Ambulance [] Other:                     [] Sits in wheelchair during transport   Where is w/c stored during transport?  [] [place]  []  EZ  r   [] Self-Driver       Drive while in  07/2013 [] yes [x] no   Employment and/or school:  Specific requirements pertaining to mobility        Other:  COMMUNICATION:  Verbal Communication  [x] WFL [] receptive [] WFL [] expressive [] Understandable  [] Difficult to understand  [] non-communicative  Primary Language:____English__________ 2nd:_____________  Communication provided by:[x] Patient [] Family [] Caregiver [] Translator   [] Uses an augmentative communication device     Manufacturer/Model :  MOBILITY/BALANCE:  Sitting Balance  Standing Balance  Transfers  Ambulation   [x] WFL      [] WFL  [x] Independent  []  Independent   [] Uses UE for balance in sitting Comments:  [x] Uses UE/device for stability Comments:  []  Min assist  [x]  Ambulates independently with       device:_Standard walker__________________      []  Mod assist  []  Able to ambulate ______ feet        safely/functionally/independently   []  Min assist  []  Min assist  []  Max assist  []  Non-functional ambulator         History/High risk of falls   []  Mod assist  []  Mod assist  []  Dependent  []  Unable to ambulate   []  Max  assist  []  Max assist  Transfer method:[] 1 person [] 2 person [] sliding board [] squat pivot [] stand pivot [] mechanical patient lift  [] other:   []  Unable  []  Unable    Fall History: # of falls in the past 6 months? 0 # of "near" falls in the past 6 months? 1    CURRENT SEATING / MOBILITY:  Current Mobility Device: [] None [] Cane/Walker [x] Manual [] Dependent [] Dependent w/ Tilt rScooter  [] Power (type of control):   Manufacturer:  Model:  Serial #:   Size:  Color:  Age:   Purchased by whom:   Current condition of mobility base:    Current seating system:                                                                        Age of seating system:    Describe posture in present seating system:    Is the current mobility meeting medical necessity?:  [] Yes [] No Describe:                                     Ability to complete Mobility-Related Activities of Daily Living (MRADL's) with Current Mobility Device:   Move room to room  [] Independent  [x] Min [] Mod [] Max assist  [] Unable  Comments:   Meal prep  [] Independent  [] Min [] Mod [x] Max assist  [] Unable    Feeding  [x] Independent  [] Min [] Mod [] Max assist  [] Unable    Bathing  [x] Independent  [] Min [] Mod [] Max assist  [] Unable    Grooming  [x] Independent  [] Min [] Mod [] Max assist  [] Unable    UE dressing  [x] Independent  [] Min [] Mod [] Max assist  [] Unable    LE dressing  [x] Independent   [] Min [] Mod [] Max assist  [] Unable    Toileting  [x] Independent  [] Min [] Mod [] Max assist  [] Unable    Bowel Mgt: [x]  Continent []  Incontinent []  Accidents []  Diapers []  Colostomy []  Bowel Program:  Bladder Mgt: [x]  Continent []  Incontinent []  Accidents []  Diapers []  Urinal []  Intermittent Cath []  Indwelling Cath []  Supra-pubic Cath     Current Mobility Equipment Trialed/ Ruled Out:    Does not meet mobility needs due to:    Loraine LericheMark all boxes that indicate inability to use the specific equipment listed     Meets needs for safe  independent functional  ambulation  / mobility    Risk of  Falling or History of  Falls    Enviromental limitations      Cognition    Safety concerns with  physical ability    Decreased / limitations endurance  & strength     Decreased / limitations  motor skills  & coordination    Pain    Pace /  Speed    Cardiac and/or  respiratory condition    Contra - indicated by diagnosis   Cane/Crutches  []   [x]   []   []   []   []   []   []   []   []   []    Walker / Rollator  []  NA   []   [x]   []   []   []   []   []   []   []   []   []     Manual Wheelchair K0001-K0007:  []  NA  []   []   [x]   []   []   [x]   [x]    []   []   []   []    Manual W/C (K0005) with power assist  []  NA  []   []   []   []   []   []   []   []   []   []   []    Scooter  []  NA  []   []   []   []   []   []   []   []   []   []   []    Power Wheelchair: standard joystick  []  NA  [x]   []   []   []   []   []   []   []   []   []   []    Power Wheelchair: alternative controls  []  NA  []   []   []   []   []   []   []   []   []   []   []    Summary:  The least costly alternative for independent functional mobility was found to be:    []  Crutch/Cane  []  Walker []  Manual w/c  []  Manual w/c with power assist   []  Scooter   [x]  Power w/c std joystick   []  Power w/c alternative control        []  Requires dependent care mobility device   Media planner for [employer] skills are adequate for safe mobility equipment operation  [x]   Yes []   No  Patient is willing and motivated to use recommended mobility equipment  []   Yes []   No       []  Patient is unable to safely operate mobility equipment independently and requires dependent care equipment Comments:           SENSATION and SKIN ISSUES:  Sensation [x]  Intact  []  Impaired []  Absent []  Hyposensate []  Hypersensate  []  Defensiveness  Location(s) of impairment:    Pressure Relief Method(s):  [x]  Lean side to side to offload (without risk of falling)  [x]   W/C push up (4+ times/hour for 15+ seconds) []  Stand up (without risk of falling)    []  Other: (Describe): Effective pressure relief method(s) above can be performed consistently throughout the day: rYes  r No If not, Why?:  Skin Integrity Risk:       [x]  Low risk           []  Moderate risk            []  High risk  If high risk, explain:   Skin Issues/Skin Integrity  Current skin Issues  []  Yes [x]  No []  Intact  []   Red area   []   Open area  []  Scar tissue  []  At risk from prolonged sitting  Where: History of Skin Issues  [x]  Yes []   No Where : L residual leg When: Post amputation had difficulty with healing Stage: Hx of skin flap surgeries   []  Yes []  No Where:  When:  Pain: [x]  Yes []  No   Pain Location(s): Both shoulders (L>R) with wheelchair propulsion and walking with walker - 8/10 pain; residual leg - neuropathic pain 8/10. Intensity scale: (0-10) : 8/10 How does pain interfere with mobility and/or MRADLs? - difficulty with walking, difficulty with propelling wheelchair        MAT EVALUATION:  Neuro-Muscular Status: (Tone, Reflexive, Responses, etc.)     [x]   Intact   []  Spasticity:  []  Hypotonicity  []  Fluctuating  []  Muscle Spasms  []  Poor Righting Reactions/Poor Equilibrium Reactions  []  Primal Reflex(s):    Comments:            COMMENTS:    POSTURE:     Comments:  Pelvis Anterior/Posterior:  [x]  Neutral   []  Posterior  []  Anterior  []  Fixed - No movement []  Tendency away from neutral []  Flexible []  Self-correction []  External correction Obliquity (viewed from front)  [x]  WFL []  R Obliquity []  L Obliquity  []  Fixed - No movement []  Tendency away from neutral []  Flexible []  Self-correction []  External correction Rotation  [x]  WFL []  R anterior []  L anterior  []  Fixed - No movement []  Tendency away from neutral []  Flexible []  Self-correction []  External correction Tonal Influence Pelvis:  [x]  Normal []  Flaccid []  Low tone []  Spasticity []  Dystonia []  Pelvis thrust []  Other:    Trunk Anterior/Posterior:  [x]  WFL []  Thoracic kyphosis []  Lumbar lordosis  []  Fixed - No movement []  Tendency away from neutral []  Flexible []  Self-correction []  External correction  [x]  WFL []  Convex to left  []  Convex to right []  S-curve   []  C-curve []  Multiple curves []  Tendency away from neutral []  Flexible []  Self-correction []  External correction Rotation of shoulders and upper trunk:  [x]  Neutral []  Left-anterior []  Right- anterior []  Fixed- no movement []  Tendency away from neutral []  Flexible []  Self correction []  External correction Tonal influence Trunk:  [x]   Normal []  Flaccid []  Low tone []  Spasticity []  Dystonia []  Other:   Head & Neck  [x]  Functional []  Flexed    []  Extended []  Rotated right  []  Rotated left []  Laterally flexed right []  Laterally flexed left []  Cervical hyperextension   [x]  Good head control []  Adequate head control []  Limited head control []  Absent head control Describe tone/movement of head and neck:      Lower Extremity Measurements:   Hip positions:  [x]  Neutral   []  Abducted   []  Adducted  []  Subluxed   []  Dislocated   []  Fixed   []  Tendency away from neutral [x]  Flexible [x]  Self-correction []  External correction   Hip Windswept:[x]  Neutral  []  Right    []  Left  []  Subluxed   []  Dislocated   []  Fixed   []  Tendency away from neutral [x]  Flexible [x]  Self-correction []  External correction  LE Tone: [x]  Normal []  Low tone []  Spasticity []  Flaccid []  Dystonia []  Rocks/Extends at hip []  Thrust into knee extension []  Pushes legs downward into footrest  Foot positioning: ROM Concerns: Dorsiflexed: []  Right   []  Left Plantar flexed: []  Right    []  Left Inversion: []  Right    []  Left Eversion: []  Right    []  Left  LE Edema: [x]  1+ (Barely detectable impression when finger is pressed into skin) []  2+ (  slight indentation. 15 seconds to rebound) []  3+ (deeper indentation. 30 seconds to rebound) []  4+ (>30 seconds to rebound)  UE Measurements:  UPPER EXTREMITY ROM: WNL with pain with shoulder extensions  UPPER EXTREMITY MMT:  MMT Right 03/09/2022 Left 03/09/2022  Shoulder flexion 3+ 4  Shoulder abduction 3+ 4  Shoulder adduction    Elbow flexion 4 5  Elbow extension 4 5  Wrist flexion    Wrist extension    Pinch strength    Grip strength    (Blank rows = not tested)  Shoulder Posture:  Right Tendency towards Left  [x]   Functional [x]    []   Elevation []    []   Depression []    []   Protraction []    []   Retraction []    []   Internal rotation []    []   External rotation []    []    Subluxed []     UE Tone: [x]  Normal []  Flaccid []  Low tone []  Spasticity  []  Dystonia []  Other:   UE Edema: [x]  1+ (Barely detectable impression when finger is pressed into skin) []  2+ (slight indentation. 15 seconds to rebound) []  3+ (deeper indentation. 30 seconds to rebound) []  4+ (>30 seconds to rebound)  Wrist/Hand: Handedness: [x]  Right   []  Left   []  NA: Comments:  Right  Left  [x]   WNL [x]    []   Limitations []    []   Contractures []    []   Fisting []    []   Tremors []    []   Weak grasp []    []   Poor dexterity []    []   Hand movement non functional []    []   Paralysis []         MOBILITY BASE RECOMMENDATIONS and JUSTIFICATION:  MOBILITY BASE  JUSTIFICATION   Manufacturer:   Games developer:             select                 Color:  Seat Width:  18 Seat Depth 20   []  Manual mobility base (continue below)   []  Scooter/POV  [x]  Power mobility base   Number of hours per day spent in above selected mobility base: 20+  Typical daily mobility base use Schedule: During wake hours of the day.   [x]  is not a safe, functional ambulator  []  limitation prevents from completing a MRADL(s) within a reasonable time frame    []  limitation places at high risk of morbidity or mortality secondary to  the attempts to perform a    MRADL(s)  []  limitation prevents accomplishing a MRADL(s) entirely  [x]  provide independent mobility  [x]  equipment is a lifetime medical need  [x]  walker or cane inadequate  [x]  any type manual wheelchair      inadequate  []  scooter/POV inadequate      []  requires dependent mobility          MANUAL MOBILITY      []  Standard manual wheelchair  K0001      Arm:    []  both []  right  []  left      Foot:   []  both []  right   []  left  []  self-propels wheelchair  []  will use on regular basis  []  chair fits throughout home  []  willing and motivated to use  []  propels with assistance     []  dependent use   []  Standard hemi-manual wheelchair  K0002       Arm:    []  both []   right  []  left      Foot:   []  both []  right   []  left  []  lower seat height required to foot propel  []  short stature  []  self-propels wheelchair  []  will use on regular basis  []  chair fits throughout home  []  willing and motivated to use   []  propels with assistance  []  dependent use   []  Lightweight manual wheelchair  K0003      Arm:    []  both []  right  []  left      Foot:   []  both  []  right  []  left                   []  hemi height required  []  medical condition and weight of  wheelchair affect ability to self      propel standard manual wheelchair in the residence  []  can and does self-propel (marginal propulsion skills)  []  daily use _________hours  []  chair fits throughout home  []  willing and motivated to use  []  lower seat height required to foot propel  []  short stature   []  High strength lightweight manual  wheelchair (Breezy Ultra 4)  K0004     Arm:    []  both []  right  []  left     Foot:   []  both []  right   []  left                                                                  []  hemi height required []  medical condition and weight of wheelchair affect ability to self propel while engaging in frequent MRADL(s) that cannot be performed in a standard or lightweight manual wheelchair  []  daily use _________hours  []  chair fits throughout home  []  willing and motivated to use  []  prevent repetitive use injuries   []  lower seat height required to foot propel  []  short stature    []  Ultra-lightweight manual wheelchair  K0005     Arm:    []  both []  right  []  left     Foot:   []  both []  right  []  left       []  hemi height required  []  heavy duty    Front seat to floor _____ inches      Rear seat to floor _____ inches      Back height _____ inches     Back angle ______ degrees      Front angle _____ degrees  []   full-time manual wheelchair user  []  Requires individualized fitting and optimal adjustments for multiple features that include  adjustable axle configuration, fully adjustable center of gravity, wheel camber, seat and back angle, angle of seat slope, which cannot be accommodated by a K0001 through K0004 manual wheelchair  []  prevent repetitive use injuries  []  daily use_________hours   []  user has high activity patterns that frequently require  them  to go out into the community for the purpose of independently accomplishing high level MRADL activities. Examples of these might include a combination of; shopping, work, school, Science writer, childcare, independently loading and unloading from a vehicle etc.  []  lower seat height required to foot propel  []  short stature  []  heavy duty -  weight over 250lbs   []  Current chair is a K0005   manufacture:___________________  model:_________________  serial#____________________  age:_________    []  First time [id number] user (complete trial)  K0004 time and # of strokes to propel 30 feet: ________seconds _________strokes  U0454 time and # of strokes to propel 30 feet: ________seconds _________strokes  What was the result of the trial between the K0004 and K0005 manual wheelchair? ___    What features of the K0005 w/c are needed as compared to the K0004 base? Why?___    []  adjustable seat and back angle changes the angle of seat slope of the frame to attain a gravity assisted position for efficient propulsion and proper weight distribution along the frame     []  the front of the wheelchair will be configured higher than the back of the chair to allow gravity to assist the user with postural stability  []  the center of the wheel will be positioned for stability, safety and efficient propulsion  []  adjustable axle allows for vertical, horizontal, camber and overall width changes  throughout the wheels for adjustment of the client's exact needs and abilities.   []  adjustable axle increases the stability and function of the chair allowing for adjustment of the center of gravity.   []   accommodates the client's anatomical position in the chair maximizing independence in mobility and maneuverability in all environments.   []  create a minimal fixed tilt-in space to assist in positioning.   []  Describe users full-time manual wheelchair activity patterns:___    []  Power assist Comments:  []  prevent repetitive use injuries  []  repetitive strain injury present in    shoulder girdle    []  shoulder pain is (> or =) to 7/10     during manual propulsion       Current Pain _____/10  []  requires conservation of energy to participate in MRADL(s) runable to propel up ramps or curbs using manual wheelchair  []  been K0005 user greater than one year  []  user unwilling to use power      wheelchair (reason): []  less expensive option to power   wheelchair   []  rim activated power assist -      decreased strength   []  Heavy duty manual wheelchair       [id number]     Arm:    []  both []  right  []  left     Foot:   []  both []  right  []  left     []  hemi height required    []  Dependent base  []  user exceeds 250lbs  []  non-functional ambulator    []  extreme spasticity  []  over active movement   []  broken frame/hx of repeated     repairs  []  able to self-propel in residence       []  lower seat to floor height required  []  unable to self-propel in residence   []  Extra heavy duty manual wheelchair  [id number]     Arm:    []  both []  right  []  left     Foot:   []  both []  right  []  left     []  hemi height required  []  Dependent base  []  user exceeds 300lbs  []  non-functional ambulator    []  able to self-propel in residence   []  lower seat to floor height required  []  unable to self-propel in residence     []  Manual wheelchair with tilt [id number]      (Manual "Tilt-n-Space")  []   patient is dependent for transfers  []  patient requires frequent       positioning for pressure relief   []  patient requires frequent      positioning for poor/absent trunk control        []  Stroller Base  []  infant/child   []   unable to propel manual      wheelchair  []  allows for growth  []  non-functional ambulator  []  non-functional UE  []  independent mobility is not a goal at this time    MANUAL FRAME OPTIONS      Push handles  []  extended  rangle adjustable   []  standard  []  caregiver access  []  caregiver assist    []  allows "hooking" to enable      increased ability to perform ADLs or maintain balance   []  Angle Adjustable Back  []  postural control  []  control of tone/spasticity  []  accommodation of range of motion  []  UE functional control  []  accommodation for seating system    Rear wheel placement  []  std/fixed rfully adjustableramputee   []  camber ________degree  []  removable rear wheel  []  non-removable rear wheel  Wheel size _______  Wheel style_______________________  []  improved UE access to wheels  []  increase propulsion ability  []  improved stability  []  changing angle in space for      improvement of postural stability  []  remove for transport    []  allow for seating system to fit on      base  []  amputee placement  []  1-arm drive access   r R  r L  []  enable propulsion of manual       wheelchair with one arm    []  amputee placement   Wheel rims/ Hand rims  []  Standard    []  Specialized-____ []  provide ability to propel manual   []  increase self-propulsion with hand wheelchair weakness/decreased grasp     []  Spoke protector/guard   []  prevent hands from getting caught in spokes   Tires:  []  pneumatic  []  flat free inserts  []  solid  Style:  []  decrease roll resistance              []  prevent frequent flats  []  increase shock absorbency  []  decrease maintenance   []  decrease pain from road shock    []  decrease spasms from road shock    Wheel Locks:    []  push []  pull []  scissor  []  lock wheels for transfers  []  lock wheels from rolling   Brake/wheel lock extension:  []  R  []  L  []  allow user to operate wheel locks due to decreased reach or strength   Caster housing:  Caster  size:                      Style:                                          []  suspension fork  []  maneuverability   []  stability of wheelchair   []  durability  []  maintenance  []  angle adjustment for posture  []  allow for feet to come under        wheelchair base  []  allows change in seat to floor      height   []  increase shock absorbency  []  decrease pain from road shock  []   decrease spasms from road    shock   []  Side guards  []  prevent clothing getting caught in wheel or becoming soiled  rprovide hip and pelvic stability  []  eliminates contact between body and wheels  []  limit hand contact with wheels   []  Anti-tippers      []  prevent wheelchair from tipping    backward  []  assist caregiver with curbs     POWER MOBILITY      []  Scooter/POV    []  can safely operate   []  can safely transfer   []  has adequate trunk stability   []  cannot functionally propel  manual wheelchair    [x]  Power mobility base    []  non-ambulatory   [x]  cannot functionally propel manual wheelchair   []  cannot functionally and safely      operate scooter/POV  [x]  can safely operate power       wheelchair  [x]  home is accessible  [x]  willing to use power wheelchair     Tilt  []  Powered tilt on powered chair  []  Powered tilt on manual chair  []  Manual tilt on manual chair Comments:  []  change position for pressure      []  elief/cannot weight shift   []  change position against      gravitational force on head and      shoulders   []  decrease pain  []  blood pressure management   []  control autonomic dysreflexia  []  decrease respiratory distress  []  management of spasticity  []  management of low tone  []  facilitate postural control   []  rest periods   []  control edema  []  increase sitting tolerance   []  aid with transfers     Recline   []  Power recline on power chair  []  Manual recline on manual chair  Comments:    []  intermittent catheterization  []  manage spasticity  []  accommodate femur  to back angle  []  change position for pressure relief/cannot weight shift rhigh risk of pressure sore development  []  tilt alone does not accomplish     effective pressure relief, maximum pressure relief achieved at -      _______ degrees tilt   _______ degrees recline   []  difficult to transfer to and from bed []  rest periods and sleeping in chair  []  repositioning for transfers  []  bring to full recline for ADL care  []  clothing/diaper changes in chair  []  gravity PEG tube feeding  []  head positioning  []  decrease pain  []  blood pressure management   []  control autonomic dysreflexia  []  decrease respiratory distress  []  user on ventilator     Elevator on mobility base  []  Power wheelchair  []  Scooter  []  increase Indep in transfers   []  increase Indep in ADLs    []  bathroom function and safety  []  kitchen/cooking function and safety  []  shopping  []  raise height for communication at standing level  []  raise height for eye contact which reduces cervical neck strain and pain  []  drive at raised height for safety and navigating crowds  []  Other:   []  Vertical position system  (anterior tilt)     (Drive locks-out)    []  Stand       (Drive enabled)  []  independent weight bearing  []  decrease joint contractures  []  decrease/manage spasticity  []  decrease/manage spasms  []  pressure distribution away from   scapula, sacrum, coccyx, and ischial tuberosity  []  increase digestion and elimination   []   access to counters and cabinets  []  increase reach  []  increase interaction with others at eye level, reduces neck strain  []  increase performance of       MRADL(s)      Power elevating legrest    []  Center mount (Single) 85-170 degrees       []  Standard (Pair) 100-170 degrees  []  position legs at 90 degrees, not available with std power ELR  []  center mount tucks into chair to decrease turning radius in home, not available with std power ELR  []  provide change in position for  LE  []  elevate legs during recline    []  maintain placement of feet on      footplate  []  decrease edema  []  improve circulation  []  actuator needed to elevate legrest  []  actuator needed to articulate legrest preventing knees from flexing  []  Increase ground clearance over      curbs  []   STD (pair) independently                     elevate legrest   POWER WHEELCHAIR CONTROLS      Controls/input device  []  Expandable  [x]  Non-expandable  []  Proportional  []  Right Hand []  Left Hand  []  Non-proportional/switches/head-array  []  Electrical/proximity         []   Mechanical      Manufacturer:___________________   Type:________________________ []  provides access for controlling wheelchair  []  programming for accurate control  []  progressive disease/changing condition  []  required for alternative drive      controls       []  lacks motor control to operate  proportional drive control  []  unable to understand proportional controls  []  limited movement/strength  []  extraneous movement / tremors / ataxic / spastic       []  Upgraded electronics controller/harness    []  Single power (tilt or recline)   []  Expandable    []  Non-expandable plus   []  Multi-power (tilt, recline, power legrest, power seat lift, vertical positioning system, stand)  []  allows input device to communicate with drive motors  []  harness provides necessary connections between the controller, input device, and seat functions     []  needed in order to operate power seat functions through joystick/ input device  []  required for alternative drive controls     []  Enhanced display  []  required to connect all alternative drive controls   []  required for upgraded joystick      (lite-throw, heavy duty, micro)  []  Allows user to see in which mode and drive the wheelchair is set; necessary for alternate controls       []  Upgraded tracking electronics  []  correct tracking when on uneven surfaces makes switch driving more  efficient and less fatiguing  []  increase safety when driving  []  increase ability to traverse thresholds    []  Safety / reset / mode switches     Type:    []  Used to change modes and stop the wheelchair when driving     [x]  Mount for joystick / input device/switches  [x]  swing away for access or transfers   []  attaches joystick / input device / switches to wheelchair   []  provides for consistent access  []  midline for optimal placement    []  Attendant controlled joystick plus     mount  []  safety  []  long distance driving  []  operation of seat functions  []  compliance with transportation regulations    [  x] Battery     U1 x 2 batteries [x]  required to power (power assist / scooter/ power wc / other):   []  Power inverter (24V to 12V)  []  required for ventilator / respiratory equipment / other:     CHAIR OPTIONS MANUAL & POWER      Armrests   [x]  adjustable height []  removable  []  swing away []  fixed  [x]  flip back  []  reclining  []  full length pads []  desk []  tube arms []  gel pads  [x]  provide support with elbow at 90    []  remove/flip back/swing away for  transfers  []  provide support and positioning of upper body    []  allow to come closer to table top  []  remove for access to tables  []  provide support for w/c tray  []  change of height/angles for       variable activities   []  Elbow support / Elbow stop  []  keep elbow positioned on arm pad  []  keep arms from falling off arm pad  during tilt and/or recline   Upper Extremity Support  []  Arm trough  []   R  []   L  Style:  []  swivel mount []  fixed mount   []  posterior hand support  []   tray  []  full tray  []  joystick cut out  []   R  []   L  Style:  []  decrease gravitational pull on      shoulders  []  provide support to increase UE  function  []  provide hand support in natural    position  []  position flaccid UE  []  decrease subluxation    []  decrease edema       []  manage spasticity   []  provide midline positioning  []   provide work surface  []  placement for AAC/ Computer/ EADL       Hangers/ Legrests   []  ______ degree  []  Elevating []  articulating  []  swing away []  fixed []  lift off  []  heavy duty []  adjustable knee angle  []  adjustable calf panel   []  longer extension tube              []  provide LE support  []  maintain placement of feet on      footplate   []  accommodate lower leg length  []  accommodate to hamstring       tightness  []  enable transfers  []  provide change in position for LE's  []  elevate legs during recline    []  decrease edema  []  durability      Foot support   []  footplate []  R []  L []  flip up           []  Depth adjustable   []  angle adjustable  []  foot board/one piece    []  provide foot support  []  accommodate to ankle ROM  []  allow foot to go under wheelchair base  []  enable transfers     []  Shoe holders  []  position foot    []  decrease / manage spasticity  []  control position of LE  []  stability    []  safety     []  Ankle strap/heel      loops  []  support foot on foot support  []  decrease extraneous movement  []  provide input to heel   []  protect foot     []  Amputee adapter []  R  []  L     Style:  Size:  []  Provide support for stump/residual extremity    []  Transportation tie-down  []  to provide crash tested tie-down brackets    []  Crutch/cane holder    []  O2 holder    []  IV hanger   []  Ventilator tray/mount    []  stabilize accessory on wheelchair       Component  Justification     []  Seat cushion      []  accommodate impaired sensation  []  decubitus ulcers present or history  []  unable to shift weight  []  increase pressure distribution  []  prevent pelvic extension  []  custom required "off-the-shelf"    seat cushion will not accommodate deformity  []  stabilize/promote pelvis alignment  []  stabilize/promote femur alignment  []  accommodate obliquity  []  accommodate multiple deformity  []  incontinent/accidents  []  low maintenance     []   seat mounts                 []  fixed []  removable  []  attach seat platform/cushion to wheelchair frame    []  Seat wedge    []  provide increased aggressiveness of seat shape to decrease sliding  down in the seat  []  accommodate ROM        []  Cover replacement   []  protect back or seat cushion  []  incontinent/accidents    []  Solid seat / insert    []  support cushion to prevent      hammocking  []  allows attachment of cushion to mobility base    []  Lateral pelvic/thigh/hip     support (Guides)     []  decrease abduction  []  accommodate pelvis  []  position upper legs  []  accommodate spasticity  []  removable for transfers     []  Lateral pelvic/thigh      supports mounts  []  fixed   []  swing-away   []  removable  []  mounts lateral pelvic/thigh supports     []  mounts lateral pelvic/thigh supports swing-away or removable for transfers    []  Medial thigh support (Pommel)  [] decrease adduction  [] accommodate ROM  []  remove for transfers   []  alignment      []  Medial thigh   []  fixed      support mounts      []  swing-away   []  removable  []  mounts medial thigh supports   []  Mounts medial supports swing- away or removable for transfers       Component  Justification   []  Back       []  provide posterior trunk support []  facilitate tone  []  provide lumbar/sacral support []  accommodate deformity  []  support trunk in midline   []  custom required "off-the-shelf" back support will not accommodate deformity   []  provide lateral trunk support []  accommodate or decrease tone            []  Back mounts  []  fixed  []  removable  []  attach back rest/cushion to wheelchair frame   []  Lateral trunk      supports  []  R []  L  []  decrease lateral trunk leaning  []  accommodate asymmetry    []  contour for increased contact  []  safety    []  control of tone    []  Lateral trunk      supports mounts  []  fixed  []  swing-away   []  removable  []  mounts lateral trunk supports     []  Mounts lateral trunk supports  swing-away or removable for transfers   []  Anterior chest  strap, vest     []  decrease forward movement of shoulder  []  decrease forward movement of trunk  []  safety/stability  []  added abdominal support  []  trunk alignment  []  assistance with shoulder control   []  decrease shoulder elevation    []  Headrest      []  provide posterior head support  []  provide posterior neck support  []  provide lateral head support  []  provide anterior head support  []  support during tilt and recline  []  improve feeding     []  improve respiration  []  placement of switches  []  safety    []  accommodate ROM   []  accommodate tone  []  improve visual orientation   []  Headrest           []  fixed []  removable []  flip down      Mounting hardware   []  swing-away laterals/switches  []  mount headrest   []  mounts headrest flip down or  removable for transfers  []  mount headrest swing-away laterals   []  mount switches     []  Neck Support    []  decrease neck rotation  []  decrease forward neck flexion   Pelvic Positioner    []  std hip belt          []  padded hip belt  []  dual pull hip belt  []  four point hip belt  []  stabilize tone  []  decrease falling out of chair  []  prevent excessive extension  []  special pull angle to control      rotation  []  pad for protection over boney   prominence  []  promote comfort    []  Essential needs        bag/pouch   []  medicines []  special food rorthotics []  clothing changes  []  diapers  []  catheter/hygiene []  ostomy supplies   The above equipment has a life- long use expectancy.  Growth and changes in medical and/or functional conditions would be the exceptions.   SUMMARY:  Why mobility device was selected; include why a lower level device is not appropriate:   ASSESSMENT:  Timed up and go test: with standard walker: 27.62 sec (without prosthetic limb) 10 meter walk test: 0.33 m/s (<0/50 = high risk for fall)  CLINICAL IMPRESSION: Patient is a 62  y.o. male who  was seen today for physical therapy evaluation and treatment for wheelchair evaluation. 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 He spends most of the time in manual wheelchair. Pt is reporting increased soreness from propelling wheelchair at home and use of his walker. Patient is at high risk for fall based on Timed up and Go test score and 10 meter walk test. Patient will benefit from class 2 power chair to improve his functional mobility at home and reduce bil shoulder pain and soreness.    OBJECTIVE IMPAIRMENTS Abnormal gait, decreased activity tolerance, decreased balance, decreased endurance, decreased mobility, difficulty walking, decreased strength, hypomobility, impaired flexibility, impaired UE functional use, postural dysfunction, prosthetic dependency , and pain.   ACTIVITY LIMITATIONS lifting, bending, standing, squatting, transfers, toileting, and hygiene/grooming  PARTICIPATION LIMITATIONS: meal prep, cleaning, laundry, driving, and community activity  PERSONAL FACTORS Age, Past/current experiences, Time since onset of injury/illness/exacerbation, and 1 comorbidity: L AKA  are also affecting patient's functional outcome.   REHAB POTENTIAL: Good  CLINICAL DECISION MAKING: Stable/uncomplicated  EVALUATION COMPLEXITY: Low  GOALS: One time visit. No goals established.    PLAN: PT FREQUENCY: one time visit    Ileana Ladd, PT 03/09/2022, 2:03 PM    I concur with the above findings and recommendations of the therapist:  Physician name printed:         Physician's signature:      Date:

## 2022-03-10 ENCOUNTER — Other Ambulatory Visit: Payer: Self-pay

## 2022-03-11 ENCOUNTER — Encounter: Payer: Medicaid Other | Admitting: Physical Medicine and Rehabilitation

## 2022-03-17 ENCOUNTER — Telehealth: Payer: Self-pay | Admitting: Cardiology

## 2022-03-17 NOTE — Telephone Encounter (Signed)
Spoke with Loanne Drilling form [employer] who reported that patient has intermittent chest pain and dizziness. She is faxing VS to [phone]. She stated the paine occurs between midnight and 6 am. He has not used NTG. Appointment made with [facility] for 1/26.

## 2022-03-17 NOTE — Progress Notes (Signed)
Cardiology Office Note:   Date:  03/18/2022  NAME:  Benjamin [name]    MRN: [id number] DOB:  07-16-1960   PCP:  Dianna Rossetti, NP  Cardiologist:  Glenetta Hew, MD  Electrophysiologist:  None   Referring MD: Dianna Rossetti, NP   Chief Complaint  Patient presents with   Chest Pain   History of Present Illness:   Benjamin [name] is a 62 y.o. male with a hx of PAD, CAD, HTN, Hld who presents for follow-up.  He reports last weekend he had an episode of chest pain.  Apparently his blood pressure is lowering after he takes his blood pressure medication.  Today BP 113/66.  I suspect he may be on too much medication.  He has had no further chest pains since last week.  I did review the results of his coronary CTA.  Concerning for moderate disease in the RCA as well as severe distal left circumflex stenosis.  This has been managed medically.  He will see his primary cardiologist Dr. Ellyn Hack next month.  There is no left main or triple-vessel disease from what I can tell.  His symptoms of chest discomfort have resolved.  His EKG demonstrates sinus rhythm heart rate 60.  He has not had a repeat echocardiogram in several years.  I have recommended this.  I wonder if he is on too much blood pressure medication.  Current regimen includes amlodipine 2.5 mg daily, losartan 50 mg daily, carvedilol 6.25 milligrams twice daily, Imdur 30 mg daily.  I suspect he is on too much blood pressure medication.  We discussed stopping this.  He is on aspirin and statin therapy.  His EKG demonstrates sinus rhythm with anterolateral T wave inversions.  This is unchanged from prior.  He reports no further chest pain episodes.  Problem List PAD -L AKA -R ABI 0.83 2. HTN 3. HLD 4. CAD -CAC 59 (94th percentile) -50% LAD -50-69% RCA  -70-99% dLCX  Past Medical History: Past Medical History:  Diagnosis Date   Back pain    Diabetes mellitus without complication (HCC)    Heavy smoker    Hyperlipidemia LDL goal <70     Hypertension    Peripheral arterial occlusive disease ([place]) 06/2013   Bilateral femoral artery disease    Past Surgical History: Past Surgical History:  Procedure Laterality Date   ABDOMINAL AORTOGRAM W/LOWER EXTREMITY N/A 07/12/2016   Procedure: Abdominal Aortogram w/Lower Extremity;  Surgeon: Serafina Mitchell, MD;  Location: [facility];  Service: Cardiovascular;  Laterality: N/A;  Bilateral extermity: Patent Renal As. No sig Dz in infrarenal Abd Aorta. Normal Bilat Iliac arteries. R SFA is 100% @ origin - recon in AK-Pop A. R PT A patent. L CFA occluded. L PFA recon @ origin, L SFA occluded w/ recon in AK Pop A.   ABDOMINAL AORTOGRAM W/LOWER EXTREMITY N/A 10/15/2019   Procedure: ABDOMINAL AORTOGRAM W/LOWER EXTREMITY;  Surgeon: Serafina Mitchell, MD;  Location: [facility];  Service: Cardiovascular;  Laterality: N/A;   AMPUTATION Left 10/04/2020   Procedure: LEFT ABOVE KNEE AMPUTATION;  Surgeon: Serafina Mitchell, MD;  Location: [facility];  Service: Vascular;  Laterality: Left;   AMPUTATION Left 12/08/2020   Procedure: RIGHT ABOVE KNEE AMPUTATION REVISION;  Surgeon: Angelia Mould, MD;  Location: [facility];  Service: Vascular;  Laterality: Left;   AORTA - BILATERAL FEMORAL ARTERY BYPASS GRAFT N/A 05/08/2020   Procedure: AORTA BIFEMORAL BYPASS GRAFT;  Surgeon: Serafina Mitchell, MD;  Location:  [facility];  Service: Vascular;  Laterality: N/A;   COLONOSCOPY  06/2016   never   ENDARTERECTOMY FEMORAL Left 11/14/2019   Procedure: Left groin exploration, Redo left femoral artery exposure;  Surgeon: Serafina Mitchell, MD;  [facility]: [facility];  Service: Vascular;  Laterality: Left;   FEMORAL-FEMORAL BYPASS GRAFT N/A 11/13/2019   Procedure: BYPASS GRAFT FEMORAL-FEMORAL ARTERY RIGHT TO LEFT USING HEMASHIELD GOLD GRAFT 26mm x 30cm;  Surgeon: Serafina Mitchell, MD;  Location: [facility];  Service: Vascular;  Laterality: N/A;   FEMORAL-POPLITEAL BYPASS GRAFT Left 11/13/2019   Procedure: LEFT FEMORAL BELOW  KNEE-POPLITEAL ARTERY USING NON-REVERSED GREATER SAPHENOUS VEIN;  Surgeon: Serafina Mitchell, MD;  Location: [facility];  Service: Vascular;  Laterality: Left;   FEMORAL-POPLITEAL BYPASS GRAFT Left 05/08/2020   Procedure: LEFT REDO BYPASS GRAFT COMMON FEMORAL- BELOW KNEE POPLITEAL ARTERY USING PROPATEN GRAFT;  Surgeon: Serafina Mitchell, MD;  Location: [facility];  Service: Vascular;  Laterality: Left;   I & D EXTREMITY Left 09/07/2020   Procedure: IRRIGATION AND DEBRIDEMENT LEFT LEG WITH EXCISION OF LEFT LEG DISTAL BYPASS GRAFT;  Surgeon: Marty Heck, MD;  Location: [facility];  Service: Vascular;  Laterality: Left;   LOWER EXTREMITY ANGIOGRAM Left 11/14/2019   Procedure: LEFT LOWER EXTREMITY ANGIOGRAM, BYPASS GRAFT ANGIOPLASTY;  Surgeon: Serafina Mitchell, MD;  Location: [facility];  Service: Vascular;  Laterality: Left;   PERIPHERAL VASCULAR INTERVENTION  10/15/2019   Procedure: PERIPHERAL VASCULAR INTERVENTION;  Surgeon: Serafina Mitchell, MD;  Location: [facility];  Service: Cardiovascular;;  Rt Iliac   REMOVAL OF GRAFT Left 10/04/2020   Procedure: REMOVAL OF LEFT FEMORAL TO POPITEAL BYPASS GRAFT;  Surgeon: Serafina Mitchell, MD;  Location: [facility];  Service: Vascular;  Laterality: Left;    Current Medications: Current Meds  Medication Sig   albuterol (VENTOLIN HFA) 108 (90 Base) MCG/ACT inhaler Inhale 1-2 puffs into the lungs every 6 (six) hours as needed for wheezing or shortness of breath.   atorvastatin (LIPITOR) 40 MG tablet Take 40 mg by mouth daily.   carvedilol (COREG) 12.5 MG tablet Take 12.5 mg by mouth 2 (two) times daily.   cloNIDine (CATAPRES - DOSED IN MG/24 HR) 0.1 mg/24hr patch Place 1 patch (0.1 mg total) onto the skin once a week.   famotidine (PEPCID) 20 MG tablet Take 20 mg by mouth 2 (two) times daily.   FLUoxetine (PROZAC) 10 MG capsule Take 10 mg by mouth daily.   isosorbide mononitrate (IMDUR) 30 MG 24 hr tablet Take 1 tablet (30 mg total) by mouth once daily.    nitroGLYCERIN (NITROSTAT) 0.4 MG SL tablet Place 1 tablet (0.4 mg total) under the tongue every 5 (five) minutes as needed for chest pain.   traZODone (DESYREL) 50 MG tablet Take 50 mg by mouth at bedtime.     Allergies:    Gabapentin, Tetanus toxoids, Aspirin, and Garlic   Social History: Social History   Socioeconomic History   Marital status: Single    Spouse name: Not on file   Number of children: Not on file   Years of education: 12   Highest education level: Not on file  Occupational History   Occupation: Disability  Tobacco Use   Smoking status: Former    Packs/day: 1.50    Years: 29.00    Total pack years: 43.50    Types: Cigarettes    Quit date: 10/22/2020    Years since quitting: 1.4   Smokeless tobacco: Never  Tobacco comments:    Quit smoking September 2022.  Using Nicoderm patch currently.  Vaping Use   Vaping Use: Never used  Substance and Sexual Activity   Alcohol use: No   Drug use: No   Sexual activity: Not on file  Other Topics Concern   Not on file  Social History Narrative   He works as a Secondary school teacher at [employer].   He lives with his sister and her son.   Walks every day to work at least 15+ minutes.   Social Determinants of Health   Financial Resource Strain: Medium Risk (02/18/2021)   Overall Financial Resource Strain (CARDIA)    Difficulty of Paying Living Expenses: Somewhat hard  Food Insecurity: No Food Insecurity (02/18/2021)   Hunger Vital Sign    Worried About Running Out of Food in the Last Year: Never true    Ran Out of Food in the Last Year: Never true  Transportation Needs: No Transportation Needs (02/18/2021)   PRAPARE - Hydrologist (Medical): No    Lack of Transportation (Non-Medical): No  Physical Activity: Inactive (02/18/2021)   Exercise Vital Sign    Days of Exercise per Week: 0 days    Minutes of Exercise per Session: 0 min  Stress: Stress Concern Present (02/18/2021)   [place]    Feeling of Stress : To some extent  Social Connections: Unknown (02/18/2021)   Social Connection and Isolation Panel [NHANES]    Frequency of Communication with Friends and Family: More than three times a week    Frequency of Social Gatherings with Friends and Family: More than three times a week    Attends Religious Services: Not on Advertising copywriter or Organizations: Not on file    Attends Archivist Meetings: Not on file    Marital Status: Never married     Family History: The patient's family history includes Arthritis in his mother; COPD in his father; Multiple sclerosis in his sister; Stroke in his father.  ROS:   All other ROS reviewed and negative. Pertinent positives noted in the HPI.     EKGs/Labs/Other Studies Reviewed:   The following studies were personally reviewed by me today:  EKG:  EKG is ordered today.  The ekg ordered today demonstrates normal sinus rhythm heart rate 60, anterolateral T wave inversions, and was personally reviewed by me.   CCTA 09/14/2021 IMPRESSION: 1. Coronary artery calcium score 844 Agatston units. This places the patient in the 94th percentile for age and gender, suggesting high risk for future cardiac events.   2. Extensive plaque in the proximal and mid LAD, but suspect < 50% stenosis at any given point.   3. Nondominant RCA, probably moderate stenosis in the proximal vessel.   4. Extensive LCx disease, suspect moderate stenosis in the proximal LCx, suspect severe stenosis in the distal LCx leading into the left PDA.  Recent Labs: 06/12/2021: B Natriuretic Peptide 20.4 07/22/2021: ALT 12 12/01/2021: BUN 12; Creatinine, Ser 1.00; Hemoglobin 12.8; Platelets 308; Potassium 3.9; Sodium 138   Recent Lipid Panel    Component Value Date/Time   CHOL 193 02/24/2021 1139   TRIG 152 (H) 02/24/2021 1139   HDL 48 02/24/2021 1139   CHOLHDL 4.0  02/24/2021 1139   CHOLHDL 3.9 09/06/2020 0330   VLDL 18 09/06/2020 0330   LDLCALC 118 (H) 02/24/2021 1139   LDLCALC 104 (H) 01/20/2017 0908  Physical Exam:   VS:  BP 113/66   Pulse (!) 57   Ht 5\' 8"  (1.727 m)   Wt 178 lb 12.8 oz (81.1 kg)   SpO2 97%   BMI 27.19 kg/m    Wt Readings from Last 3 Encounters:  03/18/22 178 lb 12.8 oz (81.1 kg)  02/11/22 173 lb (78.5 kg)  01/04/22 169 lb 6.4 oz (76.8 kg)    General: Well nourished, well developed, in no acute distress Head: Atraumatic, normal size  Eyes: PEERLA, EOMI  Neck: Supple, no JVD Endocrine: No thryomegaly Cardiac: Normal S1, S2; RRR; no murmurs, rubs, or gallops Lungs: Clear to auscultation bilaterally, no wheezing, rhonchi or rales  Abd: Soft, nontender, no hepatomegaly  Ext: No edema, pulses 2+ Musculoskeletal: No deformities, BUE and BLE strength normal and equal Skin: Warm and dry, no rashes   Neuro: Alert and oriented to person, place, time, and situation, CNII-XII grossly intact, no focal deficits  Psych: Normal mood and affect   ASSESSMENT:   TRAPPER GOING is a 62 y.o. male who presents for the following: 1. Coronary artery disease of native artery of native heart with stable angina pectoris ([place])   2. Mixed hyperlipidemia   3. Dizziness   4. Palpitations     PLAN:   1. Coronary artery disease of native artery of native heart with stable angina pectoris ([place]) 2. Mixed hyperlipidemia 3. Dizziness 4. Palpitations -He reports an episode of chest discomfort 1 week ago.  He reports it occurred when taking his blood pressure medications.  Current regimen includes clonidine patch, Coreg 12.5 mg twice daily, amlodipine 2.5 mg daily, losartan 50 mg daily.  He feels that his blood pressure is too low when he takes all of the medication.  We discussed stopping amlodipine as well as stopping losartan.  He is also on Imdur 30 mg daily. -He did have a coronary CTA.  Concern for moderate RCA lesion nonobstructive LAD and  severe distal circumflex lesion.  He reports he is going to the gym for up to 2 hours.  His symptoms are occurring with low blood pressure, I think this is related to his blood pressure.  We will reduce his medications and see how he does medically.  He will continue aspirin 81 mg daily and Lipitor 40 mg daily.  He will follow-up with his primary cardiologist next month.  Given his symptoms have resolved and he reports dizziness and lightheadedness with all his blood pressure medications I think he should back off medications and be reevaluated. -His EKG shows sinus rhythm with inferolateral T wave inversions which are not new.  This is reassuring.  His chest pain symptoms have resolved again.  I do not believe he is becoming unstable. -I would like for him to update his echocardiogram.  We can discuss with Dr. Ellyn Hack if invasive angiography is needed.  He has no left main disease with triple-vessel CAD.  I think this can be managed medically first.  We will back off on blood pressure medications as above.  Disposition: Return if symptoms worsen or fail to improve.  Medication Adjustments/Labs and Tests Ordered: Current medicines are reviewed at length with the patient today.  Concerns regarding medicines are outlined above.  Orders Placed This Encounter  Procedures   EKG 12-Lead   No orders of the defined types were placed in this encounter.   Patient Instructions  Medication Instructions:  STOP Amlodipine (green tablet) STOP Losartan (white round, 126)   *If you need  a refill on your cardiac medications before your next appointment, please call your pharmacy*   Follow-Up: At [facility], you and your health needs are our priority.  As part of our continuing mission to provide you with exceptional heart care, we have created designated Provider Care Teams.  These Care Teams include your primary Cardiologist (physician) and Advanced Practice Providers (APPs -  Physician Assistants  and Nurse Practitioners) who all work together to provide you with the care you need, when you need it.  We recommend signing up for the patient portal called "MyChart".  Sign up information is provided on this After Visit Summary.  MyChart is used to connect with patients for Virtual Visits (Telemedicine).  Patients are able to view lab/test results, encounter notes, upcoming appointments, etc.  Non-urgent messages can be sent to your provider as well.   To learn more about what you can do with MyChart, go to [email or web address].    Your next appointment:   Keep upcoming appointment   Provider:   Bryan Lemma, MD       Time Spent with Patient: I have spent a total of 35 minutes with patient reviewing hospital notes, telemetry, EKGs, labs and examining the patient as well as establishing an assessment and plan that was discussed with the patient.  > 50% of time was spent in direct patient care.  Signed, Lenna Gilford. Flora Lipps, MD, [facility]  [facility]  [address], [address] [place], [place] [address] [phone]  03/18/2022 4:41 PM

## 2022-03-17 NOTE — Telephone Encounter (Signed)
Pt c/o of Chest Pain: STAT if CP now or developed within 24 hours  1. Are you having CP right now? Not currently with patient  2. Are you experiencing any other symptoms (ex. SOB, nausea, vomiting, sweating)? Tinnitus   3. How long have you been experiencing CP? 6-8 months  4. Is your CP continuous or coming and going? Comes and goes  5. Have you taken Nitroglycerin? no  Patient's nurse case manager, Loanne Drilling from [facility], states the patient has been having chest pain. She says he has been to the ED several times, but has not been admitted. She requests the call back go to her at: [phone]     ?

## 2022-03-18 ENCOUNTER — Ambulatory Visit: Payer: Medicaid Other | Attending: Cardiovascular Disease | Admitting: Cardiovascular Disease

## 2022-03-18 ENCOUNTER — Encounter: Payer: Self-pay | Admitting: Cardiovascular Disease

## 2022-03-18 VITALS — BP 113/66 | HR 57 | Ht 68.0 in | Wt 178.8 lb

## 2022-03-18 DIAGNOSIS — I25110 Atherosclerotic heart disease of native coronary artery with unstable angina pectoris: Secondary | ICD-10-CM

## 2022-03-18 DIAGNOSIS — R42 Dizziness and giddiness: Secondary | ICD-10-CM

## 2022-03-18 DIAGNOSIS — I25118 Atherosclerotic heart disease of native coronary artery with other forms of angina pectoris: Secondary | ICD-10-CM | POA: Diagnosis not present

## 2022-03-18 DIAGNOSIS — E782 Mixed hyperlipidemia: Secondary | ICD-10-CM | POA: Diagnosis not present

## 2022-03-18 DIAGNOSIS — R002 Palpitations: Secondary | ICD-10-CM | POA: Diagnosis not present

## 2022-03-18 NOTE — Patient Instructions (Signed)
Medication Instructions:  STOP Amlodipine (green tablet) STOP Losartan (white round, 126)   *If you need a refill on your cardiac medications before your next appointment, please call your pharmacy*   Follow-Up: At [facility], you and your health needs are our priority.  As part of our continuing mission to provide you with exceptional heart care, we have created designated Provider Care Teams.  These Care Teams include your primary Cardiologist (physician) and Advanced Practice Providers (APPs -  Physician Assistants and Nurse Practitioners) who all work together to provide you with the care you need, when you need it.  We recommend signing up for the patient portal called "MyChart".  Sign up information is provided on this After Visit Summary.  MyChart is used to connect with patients for Virtual Visits (Telemedicine).  Patients are able to view lab/test results, encounter notes, upcoming appointments, etc.  Non-urgent messages can be sent to your provider as well.   To learn more about what you can do with MyChart, go to [email or web address].    Your next appointment:   Keep upcoming appointment   Provider:   Glenetta Hew, MD

## 2022-03-19 IMAGING — CT CT ANGIO EXTREM LOW*R*
1 of 10 series · 12 of 33 positions shown · IV contrast (APPLIED)
Comparison: None.

CLINICAL DATA: History of peripheral artery disease. Previous
revascularization. Follow-up lower extremity arterial dissection.
Suspected left lower extremity abscess associated with a vascular
graft.

EXAM:
CT ANGIOGRAPHY LOWER LEFT EXTREMITY; CT ANGIOGRAPHY LOWER RIGHT
EXTREMITY
TECHNIQUE: CT imaging was acquired from the mid abdomen through the lower
extremities after the the administration of 150 mL Omnipaque 350
intravenous contrast. Computer generated coronal and sagittal MPR
and MIP were also acquired.
CONTRAST:  150mL OMNIPAQUE IOHEXOL 350 MG/ML SOLN

[Series 5: arterial · axial · arterial · 0.88mm/px · z∈[+198,+1216]mm · 12 of 603 slices shown]
[im 47/603  soft-tissue]
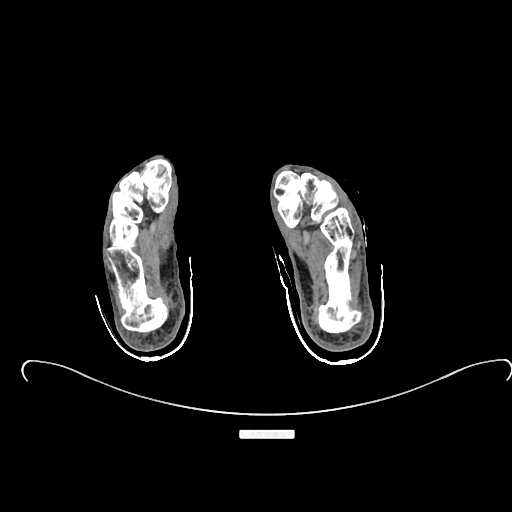
[im 93/603  bone]
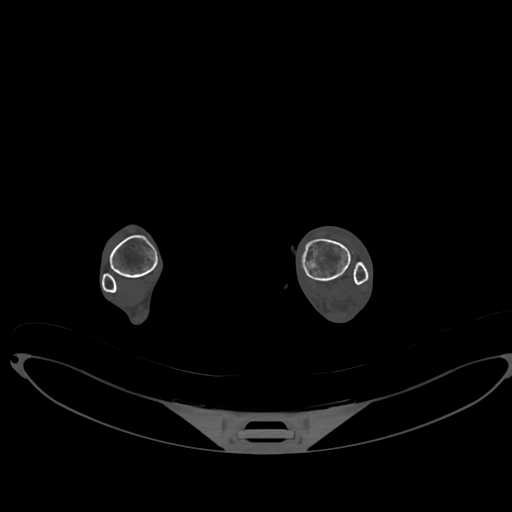
[im 139/603  soft-tissue]
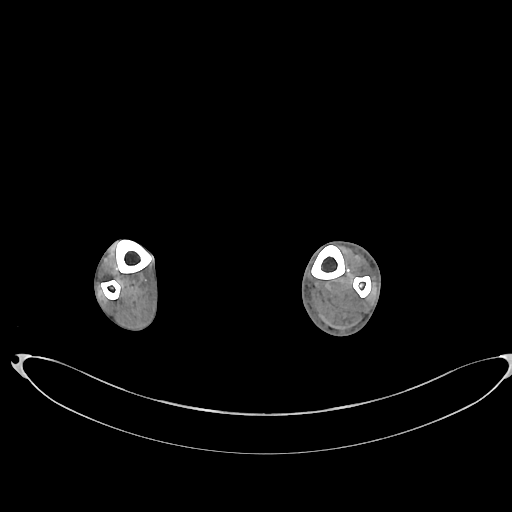
[im 186/603  bone]
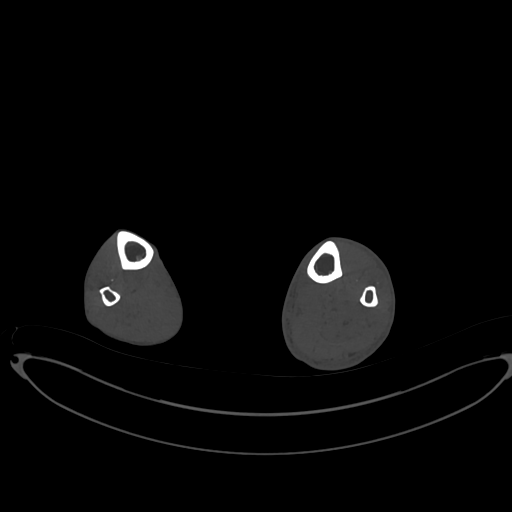
[im 232/603  soft-tissue]
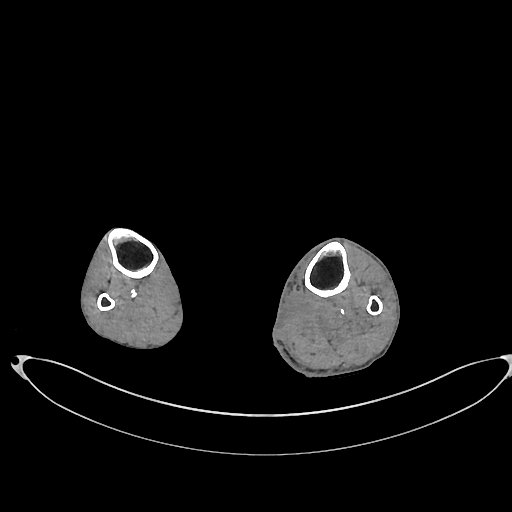
[im 278/603  bone]
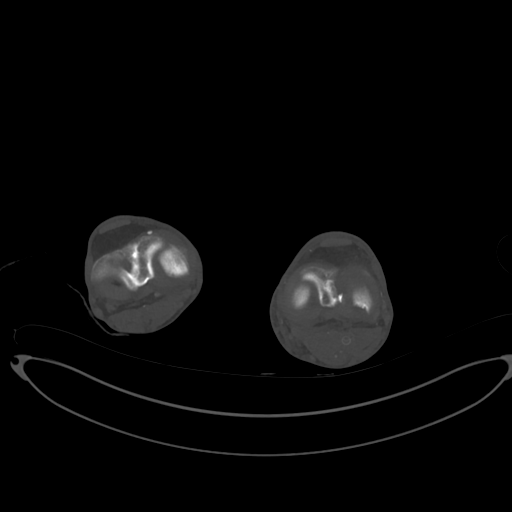
[im 325/603  soft-tissue]
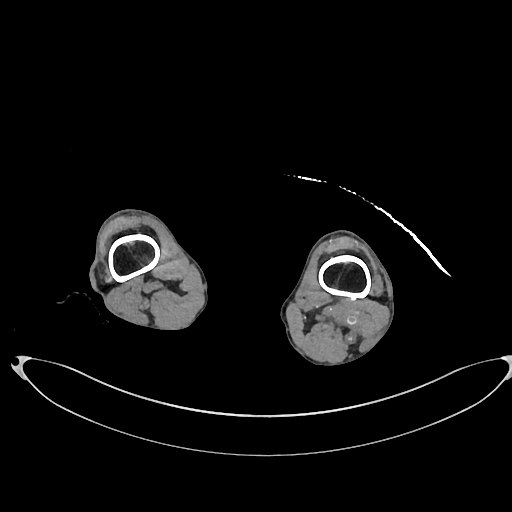
[im 371/603  bone]
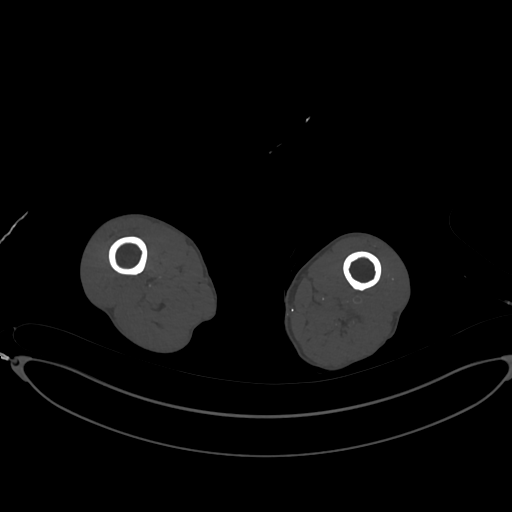
[im 417/603  soft-tissue]
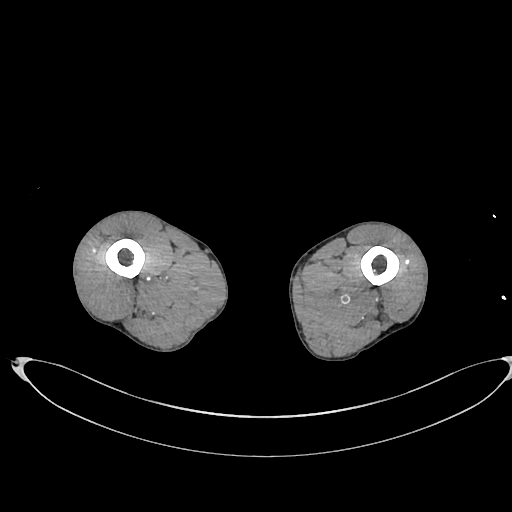
[im 464/603  bone]
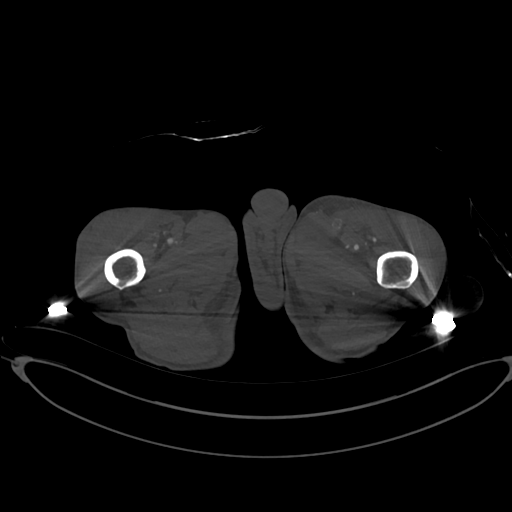
[im 510/603  soft-tissue]
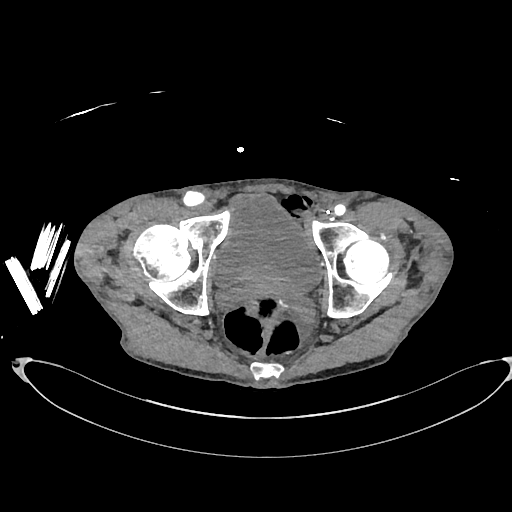
[im 556/603  bone]
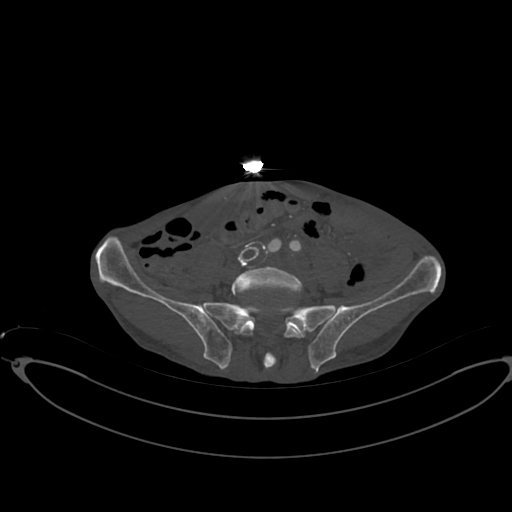

[12 of 33 positions shown; findings below may reference images not displayed]

FINDINGS: CTA FINDINGS

Lower abdomen and pelvis: Patent aortobifemoral graft, native
vessels thrombosed. No significant stenosis or aneurysm of the
visualized lower abdominal aorta. No stenosis or aneurysm or
dissection of the common or external iliac arteries. The internal
iliac arteries are occluded.

Right lower extremity: The native femoral artery is occluded. Deep
femoral artery is patent with no significant stenosis. Small
muscular branches are seen extending to the knee. The popliteal
artery is reconstituted from muscular branches. Flow is seen, there
is irregular atherosclerotic narrowing of the proximal posterior
tibial artery, providing faint flow to the foot. There is irregular
flow to small perineal and posterior tibial arteries, the latter
extending to the level of the hindfoot.

Left lower extremity: Femoral artery graft is occluded. The deep
femoral artery is widely patent, without significant stenosis. This
provide small muscular branches to the lower thigh extending to the
knee. A portion of the popliteal artery is reconstituted by muscular
branches. Is artery is again occluded at the level of the knee
joint. A portion of the trifurcation vessels are reconstituted in
the upper leg. In the mid leg the anterior tibial artery is
reconstituted from collaterals, providing faint flow to the foot.
There is subtle flow in the peroneal and posterior tibial arteries
to the level of the ankle.

NON CTA FINDINGS

There is a fluid collection adjacent to the thrombosed vascular
graph of the left lower extremity. Fluid surrounds the thrombosed
graft from the level of the distal femur to the proximal leg where
it is contiguous with an irregular fluid collection that extends to
the medial subcutaneous soft tissues of the leg. This collection
measures approximately 19 cm from superior to inferior, most of
which is directly adjacent to the thrombosed graft, by 5.8 x 4.7 cm
transversely at the level of the proximal leg.

There is subcutaneous soft tissue edema also noted throughout the
left leg below the knee. No other soft tissue collections.

No acute findings within the visualized abdomen or pelvis. There is
a thrombosed femoral to femoral arterial graft.

No fracture or bone lesion.  No evidence of osteomyelitis.

Review of the MIP images confirms the above findings.
IMPRESSION: 1. Completely thrombosed left femoral artery 2 popliteal artery
graft. There is a fluid collection with peripheral enhancement that
lies adjacent to the distal femoral artery graft, just above the
knee, extending below the knee where it is contiguous with a fluid
collection that lies along the posteromedial aspect of the upper
leg. This is consistent with an abscess.
2. Significant peripheral vascular disease. The native femoral
artery on the right is occluded. There is reconstitution of the
popliteal artery on the right from deep femoral artery branches,
providing some flow to the foot as detailed above, predominantly
along the posterior tibial artery. There is also significant
peripheral vascular disease on the left. The popliteal artery is
occluded. Trifurcation arteries are reconstituted, but small with
irregular atherosclerotic narrowing. The predominant flow is from
the anterior tibial artery, which extends into the foot.

## 2022-03-21 ENCOUNTER — Ambulatory Visit: Payer: Medicaid Other | Admitting: Nurse Practitioner

## 2022-03-22 ENCOUNTER — Encounter: Payer: Self-pay | Admitting: Physical Medicine and Rehabilitation

## 2022-03-22 ENCOUNTER — Encounter
Payer: Medicaid Other | Attending: Physical Medicine and Rehabilitation | Admitting: Physical Medicine and Rehabilitation

## 2022-03-22 VITALS — BP 99/66 | HR 71 | Ht 68.0 in

## 2022-03-22 DIAGNOSIS — I959 Hypotension, unspecified: Secondary | ICD-10-CM

## 2022-03-22 DIAGNOSIS — H9190 Unspecified hearing loss, unspecified ear: Secondary | ICD-10-CM | POA: Insufficient documentation

## 2022-03-22 DIAGNOSIS — S78112A Complete traumatic amputation at level between left hip and knee, initial encounter: Secondary | ICD-10-CM | POA: Insufficient documentation

## 2022-03-22 DIAGNOSIS — H9319 Tinnitus, unspecified ear: Secondary | ICD-10-CM | POA: Insufficient documentation

## 2022-03-22 NOTE — Progress Notes (Signed)
Subjective:    Patient ID: Benjamin [name], male    DOB: 1961-01-20, 62 y.o.   MRN: [id number]  HPI Benjamin [name] is a 62 year old man who presents for f/u of left AKA.  1) Tinnitus -occurs at 5am when he takes Losartan  -he is getting all his meds inpackaged form.  -needs to talk with someone about this -has an appointment -happens when blood pressure is elevated  2) Left AKA -sometimes hurts when he is laying down -he gets it to the rest and then he feels alright -he is not taking gabapentin -got prosthesis but it is too long and he spoke to [place].  -going to [employer] -pain has not been bad during the day, worst at night  3) Insomnia: -he takes a benadryl sometime -not sleeping that well.  -pain keeps him up at night  4) Hypotension: -BP today is 99/66 -fluctuates at home  Pain Inventory Average Pain 5 Pain Right Now 1 My pain is dull  In the last 24 hours, has pain interfered with the following? General activity 7 Relation with others 6 Enjoyment of life 6 What TIME of day is your pain at its worst? night Sleep (in general) Fair  Pain is worse with: unsure Pain improves with: medication and laying the back at night Relief from Meds:  fair  Family History  Problem Relation Age of Onset   Arthritis Mother    COPD Father    Stroke Father    Multiple sclerosis Sister    Social History   Socioeconomic History   Marital status: Single    Spouse name: Not on file   Number of children: Not on file   Years of education: 12   Highest education level: Not on file  Occupational History   Occupation: Disability  Tobacco Use   Smoking status: Former    Packs/day: 1.50    Years: 29.00    Total pack years: 43.50    Types: Cigarettes    Quit date: 10/22/2020    Years since quitting: 1.4   Smokeless tobacco: Never   Tobacco comments:    Quit smoking September 2022.  Using Nicoderm patch currently.  Vaping Use   Vaping Use: Never used  Substance and Sexual  Activity   Alcohol use: No   Drug use: No   Sexual activity: Not on file  Other Topics Concern   Not on file  Social History Narrative   He works as a Secondary school teacher at [employer].   He lives with his sister and her son.   Walks every day to work at least 15+ minutes.   Social Determinants of Health   Financial Resource Strain: Medium Risk (02/18/2021)   Overall Financial Resource Strain (CARDIA)    Difficulty of Paying Living Expenses: Somewhat hard  Food Insecurity: No Food Insecurity (02/18/2021)   Hunger Vital Sign    Worried About Running Out of Food in the Last Year: Never true    Ran Out of Food in the Last Year: Never true  Transportation Needs: No Transportation Needs (02/18/2021)   PRAPARE - Hydrologist (Medical): No    Lack of Transportation (Non-Medical): No  Physical Activity: Inactive (02/18/2021)   Exercise Vital Sign    Days of Exercise per Week: 0 days    Minutes of Exercise per Session: 0 min  Stress: Stress Concern Present (02/18/2021)   [place]  Feeling of Stress : To some extent  Social Connections: Unknown (02/18/2021)   Social Connection and Isolation Panel [NHANES]    Frequency of Communication with Friends and Family: More than three times a week    Frequency of Social Gatherings with Friends and Family: More than three times a week    Attends Religious Services: Not on file    Active Member of Clubs or Organizations: Not on file    Attends Archivist Meetings: Not on file    Marital Status: Never married   Past Surgical History:  Procedure Laterality Date   ABDOMINAL AORTOGRAM W/LOWER EXTREMITY N/A 07/12/2016   Procedure: Abdominal Aortogram w/Lower Extremity;  Surgeon: Serafina Mitchell, MD;  Location: [place] [facility];  Service: Cardiovascular;  Laterality: N/A;  Bilateral extermity: Patent Renal As. No sig Dz in infrarenal Abd  Aorta. Normal Bilat Iliac arteries. R SFA is 100% @ origin - recon in AK-Pop A. R PT A patent. L CFA occluded. L PFA recon @ origin, L SFA occluded w/ recon in AK Pop A.   ABDOMINAL AORTOGRAM W/LOWER EXTREMITY N/A 10/15/2019   Procedure: ABDOMINAL AORTOGRAM W/LOWER EXTREMITY;  Surgeon: Serafina Mitchell, MD;  Location: [place] [facility];  Service: Cardiovascular;  Laterality: N/A;   AMPUTATION Left 10/04/2020   Procedure: LEFT ABOVE KNEE AMPUTATION;  Surgeon: Serafina Mitchell, MD;  Location: [place];  Service: Vascular;  Laterality: Left;   AMPUTATION Left 12/08/2020   Procedure: RIGHT ABOVE KNEE AMPUTATION REVISION;  Surgeon: Angelia Mould, MD;  [facility];  Service: Vascular;  Laterality: Left;   AORTA - BILATERAL FEMORAL ARTERY BYPASS GRAFT N/A 05/08/2020   Procedure: AORTA BIFEMORAL BYPASS GRAFT;  Surgeon: Serafina Mitchell, MD;  [facility];  Service: Vascular;  Laterality: N/A;   COLONOSCOPY  06/2016   never   ENDARTERECTOMY FEMORAL Left 11/14/2019   Procedure: Left groin exploration, Redo left femoral artery exposure;  Surgeon: Serafina Mitchell, MD;  [facility];  Service: Vascular;  Laterality: Left;   FEMORAL-FEMORAL BYPASS GRAFT N/A 11/13/2019   Procedure: BYPASS GRAFT FEMORAL-FEMORAL ARTERY RIGHT TO LEFT USING HEMASHIELD GOLD GRAFT 53mm x 30cm;  Surgeon: Serafina Mitchell, MD;  [facility];  Service: Vascular;  Laterality: N/A;   FEMORAL-POPLITEAL BYPASS GRAFT Left 11/13/2019   Procedure: LEFT FEMORAL BELOW KNEE-POPLITEAL ARTERY USING NON-REVERSED GREATER SAPHENOUS VEIN;  Surgeon: Serafina Mitchell, MD;  [facility];  Service: Vascular;  Laterality: Left;   FEMORAL-POPLITEAL BYPASS GRAFT Left 05/08/2020   Procedure: LEFT REDO BYPASS GRAFT COMMON FEMORAL- BELOW KNEE POPLITEAL ARTERY USING PROPATEN GRAFT;  Surgeon: Serafina Mitchell, MD;  [facility];  Service: Vascular;  Laterality: Left;   I & D EXTREMITY Left 09/07/2020   Procedure: IRRIGATION AND  DEBRIDEMENT LEFT LEG WITH EXCISION OF LEFT LEG DISTAL BYPASS GRAFT;  Surgeon: Marty Heck, MD;  Location: [place];  Service: Vascular;  Laterality: Left;   LOWER EXTREMITY ANGIOGRAM Left 11/14/2019   Procedure: LEFT LOWER EXTREMITY ANGIOGRAM, BYPASS GRAFT ANGIOPLASTY;  Surgeon: Serafina Mitchell, MD;  [facility];  Service: Vascular;  Laterality: Left;   PERIPHERAL VASCULAR INTERVENTION  10/15/2019   Procedure: PERIPHERAL VASCULAR INTERVENTION;  Surgeon: Serafina Mitchell, MD;  Location: [place] [facility];  Service: Cardiovascular;;  Rt Iliac   REMOVAL OF GRAFT Left 10/04/2020   Procedure: REMOVAL OF LEFT FEMORAL TO POPITEAL BYPASS GRAFT;  Surgeon: Serafina Mitchell, MD;  Location: [place];  Service: Vascular;  Laterality: Left;   Past Surgical History:  Procedure Laterality Date   ABDOMINAL AORTOGRAM W/LOWER EXTREMITY N/A 07/12/2016   Procedure: Abdominal Aortogram w/Lower Extremity;  Surgeon: Nada Libman, MD;  [facility];  Service: Cardiovascular;  Laterality: N/A;  Bilateral extermity: Patent Renal As. No sig Dz in infrarenal Abd Aorta. Normal Bilat Iliac arteries. R SFA is 100% @ origin - recon in AK-Pop A. R PT A patent. L CFA occluded. L PFA recon @ origin, L SFA occluded w/ recon in AK Pop A.   ABDOMINAL AORTOGRAM W/LOWER EXTREMITY N/A 10/15/2019   Procedure: ABDOMINAL AORTOGRAM W/LOWER EXTREMITY;  Surgeon: Nada Libman, MD;  [facility];  Service: Cardiovascular;  Laterality: N/A;   AMPUTATION Left 10/04/2020   Procedure: LEFT ABOVE KNEE AMPUTATION;  Surgeon: Nada Libman, MD;  Location: [facility] OR;  Service: Vascular;  Laterality: Left;   AMPUTATION Left 12/08/2020   Procedure: RIGHT ABOVE KNEE AMPUTATION REVISION;  Surgeon: Chuck Hint, MD;  Location: [facility] OR;  Service: Vascular;  Laterality: Left;   AORTA - BILATERAL FEMORAL ARTERY BYPASS GRAFT N/A 05/08/2020   Procedure: AORTA BIFEMORAL BYPASS GRAFT;  Surgeon: Nada Libman,  MD;  [facility];  Service: Vascular;  Laterality: N/A;   COLONOSCOPY  06/2016   never   ENDARTERECTOMY FEMORAL Left 11/14/2019   Procedure: Left groin exploration, Redo left femoral artery exposure;  Surgeon: Nada Libman, MD;  [facility];  Service: Vascular;  Laterality: Left;   FEMORAL-FEMORAL BYPASS GRAFT N/A 11/13/2019   Procedure: BYPASS GRAFT FEMORAL-FEMORAL ARTERY RIGHT TO LEFT USING HEMASHIELD GOLD GRAFT 64mm x 30cm;  Surgeon: Nada Libman, MD;  Location: [facility] OR;  Service: Vascular;  Laterality: N/A;   FEMORAL-POPLITEAL BYPASS GRAFT Left 11/13/2019   Procedure: LEFT FEMORAL BELOW KNEE-POPLITEAL ARTERY USING NON-REVERSED GREATER SAPHENOUS VEIN;  Surgeon: Nada Libman, MD;  [facility];  Service: Vascular;  Laterality: Left;   FEMORAL-POPLITEAL BYPASS GRAFT Left 05/08/2020   Procedure: LEFT REDO BYPASS GRAFT COMMON FEMORAL- BELOW KNEE POPLITEAL ARTERY USING PROPATEN GRAFT;  Surgeon: Nada Libman, MD;  [facility];  Service: Vascular;  Laterality: Left;   I & D EXTREMITY Left 09/07/2020   Procedure: IRRIGATION AND DEBRIDEMENT LEFT LEG WITH EXCISION OF LEFT LEG DISTAL BYPASS GRAFT;  Surgeon: Cephus Shelling, MD;  [facility];  Service: Vascular;  Laterality: Left;   LOWER EXTREMITY ANGIOGRAM Left 11/14/2019   Procedure: LEFT LOWER EXTREMITY ANGIOGRAM, BYPASS GRAFT ANGIOPLASTY;  Surgeon: Nada Libman, MD;  [facility];  Service: Vascular;  Laterality: Left;   PERIPHERAL VASCULAR INTERVENTION  10/15/2019   Procedure: PERIPHERAL VASCULAR INTERVENTION;  Surgeon: Nada Libman, MD;  [facility];  Service: Cardiovascular;;  Rt Iliac   REMOVAL OF GRAFT Left 10/04/2020   Procedure: REMOVAL OF LEFT FEMORAL TO POPITEAL BYPASS GRAFT;  Surgeon: Nada Libman, MD;  [facility];  Service: Vascular;  Laterality: Left;   Past Medical History:  Diagnosis Date   Back pain    Diabetes mellitus without complication (HCC)    Heavy  smoker    Hyperlipidemia LDL goal <70    Hypertension    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  Opioid Risk Score:   Fall Risk Score:  `1  Depression screen [facility] 2/9     03/16/2021    2:06 PM  02/18/2021   10:10 AM 07/22/2020   10:30 AM 07/01/2019    9:08 AM 05/15/2019    9:35 AM 01/20/2017    8:37 AM  Depression screen PHQ 2/9  Decreased Interest 2 1 0 1 1 0  Down, Depressed, Hopeless 0 1 0 0 1 0  PHQ - 2 Score 2 2 0 1 2 0  Altered sleeping 1   2 3    Tired, decreased energy 1   1 1    Change in appetite 0   0 2   Feeling bad or failure about yourself  1   0 2   Trouble concentrating 0   0 0   Moving slowly or fidgety/restless 0   0 0   Suicidal thoughts 0   0 0   PHQ-9 Score 5   4 10    Difficult doing work/chores Somewhat difficult   Not difficult at all Extremely dIfficult       Review of Systems  Musculoskeletal:        Stump  All other systems reviewed and are negative.      Objective:   Physical Exam Gen: no distress, normal appearing, BMI 27.19, weight 178 lbs, BP 99/66 HEENT: oral mucosa pink and moist, NCAT Cardio: Reg rate Chest: normal effort, normal rate of breathing Abd: soft, non-distended Ext: no edema Psych: pleasant, normal affect Skin: intact Neuro: Alert and oriented x3, poor medical historian Musculoskeletal: Left AKA, using RW to ambulate    Assessment & Plan:   1) Left AKA -encouraged follow-up with [facility] for new prosthesis -referred to PT -continue going to the gym. -continue multivitamin -would benefit from power wheelchair- this would help him to be more active -continue RW for now  2) HTN/hypotension -commended on improved BP control -avoid processed foods -continue going to the gym -discontinue amlodipine -continue clonidine patch also helps with neuropathic pain -continue carvedilol -BP is 149/79 today.  -Advised checking  BP daily at home and logging results to bring into follow-up appointment with PCP and myself. -Reviewed BP meds today.  -Advised regarding healthy foods that can help lower blood pressure and provided with a list: 1) citrus foods- high in vitamins and minerals 2) salmon and other fatty fish - reduces inflammation and oxylipins 3) swiss chard (leafy green)- high level of nitrates 4) pumpkin seeds- one of the best natural sources of magnesium 5) Beans and lentils- high in fiber, magnesium, and potassium 6) Berries- high in flavonoids 7) Amaranth (whole grain, can be cooked similarly to rice and oats)- high in magnesium and fiber 8) Pistachios- even more effective at reducing BP than other nuts 9) Carrots- high in phenolic compounds that relax blood vessels and reduce inflammation 10) Celery- contain phthalides that relax tissues of arterial walls 11) Tomatoes- can also improve cholesterol and reduce risk of heart disease 12) Broccoli- good source of magnesium, calcium, and potassium 13) Greek yogurt: high in potassium and calcium 14) Herbs and spices: Celery seed, cilantro, saffron, lemongrass, black cumin, ginseng, cinnamon, cardamom, sweet basil, and ginger 15) Chia and flax seeds- also help to lower cholesterol and blood sugar 16) Beets- high levels of nitrates that relax blood vessels  17) spinach and bananas- high in potassium  -Provided lise of supplements that can help with hypertension:  1) magnesium: one high quality brand is Bioptemizers since it contains all 7 types of magnesium, otherwise over the counter magnesium gluconate 400mg  is a good option 2) B vitamins 3) vitamin D 4) potassium 5) CoQ10  6) L-arginine 7) Vitamin C 8) Beetroot -Educated that goal BP is 120/80. -Made goal to incorporate some of the above foods into diet.    3) Tinnitus: -discussed that seemed to be associated with gabapentin so not to take this medicine.  -appears to be associated with blood  pressure medication, discussed decreasing Coreg to 6.25mg  BID given improved BP control, sent new script -blood pressure is well controlled.  -referred to audiology in [place] -maintain good blood pressure control.   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  -Discussed benefits of exercise in reducing pain. -Discussed following foods that may reduce pain: 1) Ginger (especially studied for arthritis)- reduce leukotriene production to decrease inflammation 2) Blueberries- high in phytonutrients that decrease inflammation 3) Salmon- marine omega-3s reduce joint swelling and pain 4) Pumpkin seeds- reduce inflammation 5) dark chocolate- reduces inflammation 6) turmeric- reduces inflammation 7) tart cherries - reduce pain and stiffness 8) extra virgin olive oil - its compound olecanthal helps to block prostaglandins  9) chili peppers- can be eaten or applied topically via capsaicin 10) mint- helpful for headache, muscle aches, joint pain, and itching 11) garlic- reduces inflammation  Link to further information on diet for chronic pain: http://www.randall.com/    5) Insomnia: -start topamax 25mg  HS. Discussed to take only when needed -Try to go outside near sunrise -Get exercise during the day.  -Turn off all devices an hour before bedtime.  -Teas that can benefit: chamomile, valerian root, Brahmi (Bacopa) -Can consider over the counter melatonin, magnesium, and/or L-theanine. Melatonin is an anti-oxidant with multiple health benefits. Magnesium is involved in greater than 300 enzymatic reactions in the body and most of [name] are deficient as our soil is often depleted. There are 7 different types of magnesium- Bioptemizer's is a supplement with all 7 types, and each has unique benefits. Magnesium can also help with constipation and  anxiety.  -Pistachios naturally increase the production of melatonin -[employer]zy Earth bamboo bed sheets are free from toxic chemicals.  -Tart cherry juice or a tart cherry supplement can improve sleep and soreness post-workout  6)  HLD:  -can discuss with PCP risks and benefits of decreasing statin dose given his fatigue and LDL level 47 -advised taking Coenzyme Q10 in the meantime to help improve his energy.  Provided with list of supplements that can help with dyslipidemia: 1) Vitamin B3 500-4,000mg  in divided doses daily (would recommend starting low as can cause uncomfortable facial flushing if started at too high a dose) 2) Phytosterols 2.15 grams daily 3) Fermented soy 30-50 grams daily 4) EGCG (found in green tea): 500-1000mg  daily 5) Omega-3 fatty acids 3000-5,000mg  daily 6) Flax seed 40 grams daily 7) Monounsaturated fats 20-40 grams daily (olives, olive oil, nuts), also reduces cardiovascular disease 8) Sesame: 40 grams daily 9) Gamma/delta tocotrienols- a family of unsaturated forms of Vitamin E- 200mg  with dinner 10) Pantethine 900mg  daily in divided doses 11) Resveratrol 250mg  daily 12) N Acetyl Cysteine 2000mg  daily in divided doses 13) Curcumin 2000-5000mg  in divided doses daily 14) Pomegranate juice: 8 ounces daily, also helps to lower blood pressure 15) Pomegranate seeds one cup daily, also helps to lower blood pressure 16) Citrus Bergamot 1000mg  daily, also helps with glucose control and weight loss 17) Vitamin C 500mg  daily 18) Quercetin 500-1000mg  daily 19) Glutathione 20) Probiotics 60-100 billion organisms per day 21) Fiber 22) Oats 23) Aged garlic (can eat as food or supplement  of 600-900mg  per day) 24) Chia seeds 25 grams per day 25) Lycopene- carotenoid found in high concentrations in tomatoes. 26) Alpha linolenic acid 27) Flavonoids and anthocyanins 28) Wogonin- flavanoid that enhances reverse cholesterol transport 29) Coenzyme Q10 30) Pantethine-  derivative of Vitamin B5: 300mg  three times per day or 450mg  twice per day with or without food 31) Barley and other whole grains 32) Orange juice 33) L- carnitine 34) L- Lysine 35) L- Arginine 36) Almonds 37) Morin 38) Rutin 39) Carnosine 40) Histidine  41) Kaempferol  42) Organosulfur compounds 43) Vitamin E 44) Oleic acid 45) RBO (ferulic acid gammaoryzanol) 46) grape seed extract 47) Red wine 48) Berberine HCL 500mg  daily or twice per day- more effective and with fewer adverse effects that ezetimibe monotherapy 49) red yeast rice 2400- 4800 mg/day 50) chlorella 51) Licorice     7) Decreased hearing: Referred to audiology

## 2022-03-22 NOTE — Addendum Note (Signed)
Addended by: Izora Ribas on: 03/22/2022 01:23 PM   Modules accepted: Orders

## 2022-03-30 ENCOUNTER — Ambulatory Visit: Payer: Medicaid Other | Attending: Physical Medicine and Rehabilitation | Admitting: Audiologist

## 2022-03-30 DIAGNOSIS — H9313 Tinnitus, bilateral: Secondary | ICD-10-CM

## 2022-03-30 DIAGNOSIS — H903 Sensorineural hearing loss, bilateral: Secondary | ICD-10-CM

## 2022-03-30 NOTE — Procedures (Signed)
  Outpatient Audiology and [place], [place]  [phone]  AUDIOLOGICAL  EVALUATION  NAME: Benjamin [name]     DOB:   May 31, 1960      MRN: [id number]                                                                                     DATE: 03/30/2022     REFERENT: Dianna Rossetti, NP STATUS: Outpatient DIAGNOSIS: Tinnitus, Sensorineural Hearing Loss    History: Benjamin [name] was seen for an audiological evaluation. Benjamin [name] is receiving a hearing evaluation due to concerns for his blood pressure and sounds in his hears. Benjamin [name] feels his blood pressure medication or the COVID vaccine have given him strange random sounds in his ears when he lays down at night. Benjamin [name] has difficulty hearing when people are in a different room. No pain or pressure reported in either ear. Tinnitus began when Benjamin [name]  got his COVID vaccine and got worse when his medication for blood pressure changed. When Benjamin [name] lays in bed at night his blood pressure rises and his tinnitus worsens. Benjamin [name] has no history of hazardous noise exposure.  Medical history positive for heavy smoking which is a risk factor for tinnitus. No other relevant case history reported.   Evaluation:  Otoscopy showed a clear view of the tympanic membranes, bilaterally Tympanometry results were consistent with normal middle ear pressure, bilaterally   Audiometric testing was completed using conventional audiometry with insert and supraural transducer. Speech Recognition Thresholds were 30dB in the right ear and 25dB in the left ear. Word Recognition was performed 40dB SL, scored 100% in the right ear and 84% in the left ear. Pure tone thresholds show normal sloping to moderately severe sensorineural hearing loss in each ear.   Results:  The test results were reviewed with Benjamin [name]. 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 This loss will make speech unclear. He is a hearing aid candidate. Recommend he  speak to people face to face within five feet. Benjamin [name] was counseled at length about the role of stress on the body, blood pressure, and tinnitus. He was fearful his COVID vaccine gave him tinnitus and his blood pressure makes it work. The ringing is due to his hearing loss. The ringing is like his phantom leg pain, it is the hearing nerves reaction to his high pitched hearing loss. He needs to use neutral noise to mask the sounds at night when he is in quiet.    Recommendations: Amplification is necessary for both ears. Hearing aids can be purchased from a variety of locations. See provided list for locations in the [place] area.  Use of Noise Machine, Box Fan, or the [employer] App is recommended to mask tinnitus when needed.   43 minutes spent testing and counseling on results.   Alfonse Alpers  Audiologist, Au.D., CCC-A 03/30/2022  2:07 PM  Cc: Dianna Rossetti, NP

## 2022-04-13 ENCOUNTER — Ambulatory Visit: Payer: Medicaid Other | Admitting: Cardiology

## 2022-04-27 NOTE — Progress Notes (Deleted)
Cardiology Clinic Note   Date: 04/27/2022 ID: Benjamin [name], DOB 04-12-1960, MRN [id number]  Primary Cardiologist:  Glenetta Hew, MD  Patient Profile    Benjamin [name] is a 61 y.o. male who presents to the clinic today for follow-up.   Past medical history significant for: Moderate CAD. Coronary CTA 09/14/2021: Calcium score 844.  Distal LM mild mixed plaque (25 to 49%).  Ostial LAD mild mixed plaque (25 to 49%), proximal and mid LAD diffuse mild mixed plaque (25 to 49%).  Proximal LCx mixed moderate plaque (50 to 69%), distal LCx leading to left PDA severe mixed plaque (70 to 99%).  Proximal RCA moderate mixed plaque (50 to 69%).  FFR was unable to be processed secondary to misalignment. DOE. Echo 07/27/2016: EF 45 to 50%.  Diffuse hypokinesis. Nuclear stress test 11/06/2019: Low risk study, no ischemia or infarction.  EF 45 to 50%. Hypertension. Hyperlipidemia. Lipid panel 02/24/2021: LDL 118, HDL 48, TG 152, total 193. PAD. S/p multiple interventions.  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 Revision of left AKA 12/08/2020 secondary to poor healing. ABI 02/11/2022: Mild right lower extremity arterial disease.  TBI abnormal. Chronic back pain.   History of Present Illness    Benjamin [name] was first seen by Dr. Ellyn Hack on 07/25/2016 for preoperative cardiovascular evaluation at the request of Chana Bode, PA-C and Dr. Trula Slade from vascular surgery.  At that time patient's only complaint was DOE which he related to being a heavy smoker.  No family history of CAD.  He underwent echo which showed mildly decreased LV function.  Myoview stress test was ordered at that time for ischemic evaluation secondary to decreased LV function however test was not performed.  Patient was not seen again by cardiology until 10/29/2019 when he was seen by Dr. Acie Fredrickson for preoperative cardiovascular evaluation prior to vascular bypass surgery.  He underwent nuclear stress test which was a low risk study and showed mildly reduced EF 45  to 50%.  Patient was last seen in the office by Dr. Audie Box on 03/18/2022.  At that time patient reported feeling like his BP was too low when taking all BP medicines.  He associated symptoms of chest pain with low blood pressure.  At the time of his visit he was not having any chest pain.  Amlodipine and losartan were stopped.  Discussed repeat echo but holding off until he returns after stopping the previously mentioned BP meds.  Today, patient ***  Echo?  Chest pain?  BP readings?  Moderate CAD.  Coronary CTA July 2023 showed mild plaque in LM and LAD, moderate plaque in proximal LCx and proximal RCA, and severe plaque in distal LCx leading to left PDA.  Patient *** Continue atorvastatin, carvedilol, isosorbide, as needed SL NTG. Hypertension.  At last visit in January 2024 patient reported feeling as though BP was getting too low when he took all BP medications.  Amlodipine and losartan were stopped.  BP today *** Patient *** Continue carvedilol, clonidine patch, isosorbide. Hyperlipidemia.  LDL January 2023 118, not at goal. ***   ROS: All other systems reviewed and are otherwise negative except as noted in History of Present Illness.  Studies Reviewed    ECG personally reviewed by me today: ***  No significant changes from ***  Risk Assessment/Calculations    {Does this patient have ATRIAL FIBRILLATION?[phone]} No BP recorded.  {Refresh Note OR Click here to enter BP  :1}***        Physical Exam  VS:  There were no vitals taken for this visit. , BMI There is no height or weight on file to calculate BMI.  GEN: Well nourished, well developed, in no acute distress. Neck: No JVD or carotid bruits. Cardiac: *** RRR. No murmurs. No rubs or gallops.   Respiratory:  Respirations regular and unlabored. Clear to auscultation without rales, wheezing or rhonchi. GI: Soft, nontender, nondistended. Extremities: Radials/DP/PT 2+ and equal bilaterally. No clubbing or cyanosis. No edema ***   Skin: Warm and dry, no rash. Neuro: Strength intact.  Assessment & Plan   ***  Disposition: ***     {Are you ordering a CV Procedure (e.g. stress test, cath, DCCV, TEE, etc)?   Press F2        :[id number]   Signed, Justice Britain. Hasini Peachey, DNP, NP-C

## 2022-04-29 ENCOUNTER — Ambulatory Visit: Payer: Medicaid Other | Attending: Cardiology | Admitting: Student

## 2022-05-17 ENCOUNTER — Encounter
Payer: Medicaid Other | Attending: Physical Medicine and Rehabilitation | Admitting: Physical Medicine and Rehabilitation

## 2022-05-17 DIAGNOSIS — H9190 Unspecified hearing loss, unspecified ear: Secondary | ICD-10-CM | POA: Insufficient documentation

## 2022-05-17 DIAGNOSIS — H9319 Tinnitus, unspecified ear: Secondary | ICD-10-CM | POA: Insufficient documentation

## 2022-05-17 DIAGNOSIS — S78112A Complete traumatic amputation at level between left hip and knee, initial encounter: Secondary | ICD-10-CM | POA: Insufficient documentation

## 2022-05-17 DIAGNOSIS — I959 Hypotension, unspecified: Secondary | ICD-10-CM | POA: Insufficient documentation

## 2022-07-13 ENCOUNTER — Encounter: Payer: Self-pay | Admitting: Neurology

## 2022-07-13 ENCOUNTER — Ambulatory Visit (INDEPENDENT_AMBULATORY_CARE_PROVIDER_SITE_OTHER): Payer: Medicaid Other | Admitting: Neurology

## 2022-07-13 ENCOUNTER — Telehealth: Payer: Self-pay | Admitting: Neurology

## 2022-07-13 VITALS — BP 117/79 | HR 69 | Resp 15 | Ht 68.0 in

## 2022-07-13 DIAGNOSIS — G3184 Mild cognitive impairment, so stated: Secondary | ICD-10-CM

## 2022-07-13 NOTE — Patient Instructions (Signed)
Dementia labs including B12, TSH and ATN profile to look for Alzheimer disease biomarker MRI brain without contrast Continue your other medications Follow-up with your PCP Return in 1 year or sooner if worse.   There are well-accepted and sensible ways to reduce risk for Alzheimers disease and other degenerative brain disorders .  Exercise Daily Walk A daily 20 minute walk should be part of your routine. Disease related apathy can be a significant roadblock to exercise and the only way to overcome this is to make it a daily routine and perhaps have a reward at the end (something your loved one loves to eat or drink perhaps) or a personal trainer coming to the home can also be very useful. Most importantly, the patient is much more likely to exercise if the caregiver / spouse does it with him/her. In general a structured, repetitive schedule is best.  General Health: Any diseases which effect your body will effect your brain such as a pneumonia, urinary infection, blood clot, heart attack or stroke. Keep contact with your primary care doctor for regular follow ups.  Sleep. A good nights sleep is healthy for the brain. Seven hours is recommended. If you have insomnia or poor sleep habits we can give you some instructions. If you have sleep apnea wear your mask.  Diet: Eating a heart healthy diet is also a good idea; fish and poultry instead of red meat, nuts (mostly non-peanuts), vegetables, fruits, olive oil or canola oil (instead of butter), minimal salt (use other spices to flavor foods), whole grain rice, bread, cereal and pasta and wine in moderation.Research is now showing that the MIND diet, which is a combination of The Mediterranean diet and the DASH diet, is beneficial for cognitive processing and longevity. Information about this diet can be found in The MIND Diet, a book by Alonna Minium, MS, RDN, and online at WildWildScience.es  Finances, Power of [address] and  Advance Directives: You should consider putting legal safeguards in place with regard to financial and medical decision making. While the spouse always has power of attorney for medical and financial issues in the absence of any form, you should consider what you want in case the spouse / caregiver is no longer around or capable of making decisions.

## 2022-07-13 NOTE — Telephone Encounter (Signed)
[facility] [place]: [phone] exp. 07/13/22-09/10/22 sent to GI [phone]

## 2022-07-13 NOTE — Progress Notes (Signed)
[facility]  PATIENT: Benjamin [name] DOB: 1960-03-19  REQUESTING CLINICIAN: Martyn Malay, FNP HISTORY FROM: Patient  REASON FOR VISIT: Memory loss    HISTORICAL  CHIEF COMPLAINT:  Chief Complaint  Patient presents with   New Patient (Initial Visit)    Rm 12, alone [place] 22 cognitive impairment, hallucinations    HISTORY OF PRESENT ILLNESS:  This is a 62 year old gentleman past medical history of hypertension, hyperlipidemia, asthma, anxiety who is presenting for memory problem, problems sleeping at night and hallucinations.  He reports that he is forgetful, sometimes he will forget about his doctor's appointment.  He lives at home with his sister has also complaint about his memory.  He is alone today, so was not able to get collateral history.  On top of the memory problem he is also complaining of sleeping difficulty at night.  He will wake up in the middle of the night and has difficulty going back to sleep.  Sometimes he will wake up, then TV for an hour or 2 before going back to sleep.  He does report vivid dreams sometimes.  They are so real that he is confused after the dreams.  He also reports hallucinations, seeing a man coming out of the closet.  The man does not talk to him but will make gestures.  Other than that he is independent in all activities of daily living even though he is limited by a left above-the-knee amputation.  He does use a wheelchair.  He is driving, denies being lost in familiar places but reports when going to unfamiliar places will get loss, one day he went to [facility] and got lost and has called his house. He does not do much of cooking but does cleaning self-care without difficulty.  He has trouble with his bills due to low funds. Reports that sister has MS.    TBI:   No past history of TBI Stroke:  no past history of stroke Seizures:  no past history of seizures Sleep:   no history of sleep apnea. Mood: Anxiety  Family  history of Dementia:  Denies  Functional status: independent in all ADLs and IADLs Patient lives with sister. Cooking: sister Cleaning: patient  Shopping: yes  Bathing: yes  Toileting: yes  Driving: Yes, sometimes gets lost in unfamiliar place  Bills: paying in own bulls  Medications: Patient Ever left the stove on by accident?: n/a Forget how to use items around the house?: Denies  Getting lost going to familiar places?: denies  Forgetting loved ones names?: Denies  Word finding difficulty? Yes  Sleep: Insomnia    OTHER MEDICAL CONDITIONS: Hypertension, Hyperlipidemia, Asthma    REVIEW OF SYSTEMS: Full 14 system review of systems performed and negative with exception of: As noted in the HPI   ALLERGIES: Allergies  Allergen Reactions   Gabapentin Other (See Comments)    Causes lethargy   Tetanus Toxoids Other (See Comments)    Tinnitus started after he took this   Aspirin Nausea And Vomiting    Other Reaction(s): GI Intolerance   Garlic Other (See Comments)    Sneezing     HOME MEDICATIONS: Outpatient Medications Prior to Visit  Medication Sig Dispense Refill   acetaminophen (TYLENOL) 500 MG tablet Take by mouth.     albuterol (VENTOLIN HFA) 108 (90 Base) MCG/ACT inhaler Inhale 1-2 puffs into the lungs every 6 (six) hours as needed for wheezing or shortness of breath. 18 g 0   atorvastatin (LIPITOR)  20 MG tablet Take by mouth.     atorvastatin (LIPITOR) 40 MG tablet Take 40 mg by mouth daily.     buPROPion (WELLBUTRIN XL) 150 MG 24 hr tablet Take by mouth.     carvedilol (COREG) 12.5 MG tablet Take 12.5 mg by mouth 2 (two) times daily.     carvedilol (COREG) 6.25 MG tablet      cloNIDine (CATAPRES - DOSED IN MG/24 HR) 0.1 mg/24hr patch Place 1 patch (0.1 mg total) onto the skin once a week. 4 patch 12   clopidogrel (PLAVIX) 75 MG tablet Take by mouth.     famotidine (PEPCID) 20 MG tablet Take 20 mg by mouth 2 (two) times daily.     FLUoxetine (PROZAC) 10 MG capsule  Take 10 mg by mouth daily.     FLUoxetine (PROZAC) 20 MG capsule Take 20 mg by mouth daily.     gabapentin (NEURONTIN) 400 MG capsule Take by mouth.     isosorbide dinitrate (ISORDIL) 30 MG tablet Take 30 mg by mouth daily.     isosorbide mononitrate (IMDUR) 30 MG 24 hr tablet Take 1 tablet (30 mg total) by mouth once daily. 30 tablet 4   losartan (COZAAR) 25 MG tablet Take by mouth.     losartan (COZAAR) 50 MG tablet Take 1 tablet (50 mg total) by mouth in the morning and at bedtime. 180 tablet 1   nitroGLYCERIN (NITROSTAT) 0.4 MG SL tablet Place 1 tablet (0.4 mg total) under the tongue every 5 (five) minutes as needed for chest pain. 25 tablet 4   phenylephrine (SUDAFED PE) 10 MG TABS tablet Take by mouth.     traZODone (DESYREL) 50 MG tablet Take 50 mg by mouth at bedtime.     Vitamin D, Ergocalciferol, (DRISDOL) 1.25 MG (50000 UNIT) CAPS capsule Take by mouth.     No facility-administered medications prior to visit.    PAST MEDICAL HISTORY: Past Medical History:  Diagnosis Date   Back pain    Diabetes mellitus without complication (HCC)    Heavy smoker    Hyperlipidemia LDL goal <70    Hypertension    Memory loss    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   Bilateral femoral artery disease    PAST SURGICAL HISTORY: Past Surgical History:  Procedure Laterality Date   ABDOMINAL AORTOGRAM W/LOWER EXTREMITY N/A 07/12/2016   Procedure: Abdominal Aortogram w/Lower Extremity;  Surgeon: Nada Libman, MD;  Location: [facility];  Service: Cardiovascular;  Laterality: N/A;  Bilateral extermity: Patent Renal As. No sig Dz in infrarenal Abd Aorta. Normal Bilat Iliac arteries. R SFA is 100% @ origin - recon in AK-Pop A. R PT A patent. L CFA occluded. L PFA recon @ origin, L SFA occluded w/ recon in AK Pop A.   ABDOMINAL AORTOGRAM W/LOWER EXTREMITY N/A 10/15/2019   Procedure: ABDOMINAL AORTOGRAM W/LOWER EXTREMITY;  Surgeon: Nada Libman, MD;  Location: [facility];  Service: Cardiovascular;  Laterality: N/A;   AMPUTATION Left 10/04/2020   Procedure: LEFT ABOVE KNEE AMPUTATION;  Surgeon: Nada Libman, MD;  Location: [facility] OR;  Service: Vascular;  Laterality: Left;   AMPUTATION Left 12/08/2020   Procedure: RIGHT ABOVE KNEE AMPUTATION REVISION;  Surgeon: Chuck Hint, MD;  Location: [facility] OR;  Service: Vascular;  Laterality: Left;   AORTA - BILATERAL FEMORAL ARTERY BYPASS GRAFT N/A 05/08/2020   Procedure: AORTA BIFEMORAL BYPASS GRAFT;  Surgeon: Nada Libman, MD;  Location: [facility] OR;  Service: Vascular;  Laterality: N/A;   COLONOSCOPY  06/2016   never   ENDARTERECTOMY FEMORAL Left 11/14/2019   Procedure: Left groin exploration, Redo left femoral artery exposure;  Surgeon: Nada Libman, MD;  Location: [facility] OR;  Service: Vascular;  Laterality: Left;   FEMORAL-FEMORAL BYPASS GRAFT N/A 11/13/2019   Procedure: BYPASS GRAFT FEMORAL-FEMORAL ARTERY RIGHT TO LEFT USING HEMASHIELD GOLD GRAFT 8mm x 30cm;  Surgeon: Nada Libman, MD;  Location: [facility] OR;  Service: Vascular;  Laterality: N/A;   FEMORAL-POPLITEAL BYPASS GRAFT Left 11/13/2019   Procedure: LEFT FEMORAL BELOW KNEE-POPLITEAL ARTERY USING NON-REVERSED GREATER SAPHENOUS VEIN;  Surgeon: Nada Libman, MD;  Location: [facility] OR;  Service: Vascular;  Laterality: Left;   FEMORAL-POPLITEAL BYPASS GRAFT Left 05/08/2020   Procedure: LEFT REDO BYPASS GRAFT COMMON FEMORAL- BELOW KNEE POPLITEAL ARTERY USING PROPATEN GRAFT;  Surgeon: Nada Libman, MD;  Location: [facility] OR;  Service: Vascular;  Laterality: Left;   I & D EXTREMITY Left 09/07/2020   Procedure: IRRIGATION AND DEBRIDEMENT LEFT LEG WITH EXCISION OF LEFT LEG DISTAL BYPASS GRAFT;  Surgeon: Cephus Shelling, MD;  Location: [facility] OR;  Service: Vascular;  Laterality: Left;   LOWER EXTREMITY ANGIOGRAM Left 11/14/2019   Procedure: LEFT LOWER EXTREMITY ANGIOGRAM, BYPASS GRAFT ANGIOPLASTY;  Surgeon: Nada Libman, MD;  Location: [facility] OR;  Service:  Vascular;  Laterality: Left;   PERIPHERAL VASCULAR INTERVENTION  10/15/2019   Procedure: PERIPHERAL VASCULAR INTERVENTION;  Surgeon: Nada Libman, MD;  Location: [facility];  Service: Cardiovascular;;  Rt Iliac   REMOVAL OF GRAFT Left 10/04/2020   Procedure: REMOVAL OF LEFT FEMORAL TO POPITEAL BYPASS GRAFT;  Surgeon: Nada Libman, MD;  Location: [facility] OR;  Service: Vascular;  Laterality: Left;    FAMILY HISTORY: Family History  Problem Relation Age of Onset   Arthritis Mother    COPD Father    Stroke Father    Multiple sclerosis Sister     SOCIAL HISTORY: Social History   Socioeconomic History   Marital status: Single    Spouse name: Not on file   Number of children: 0   Years of education: 12   Highest education level: Not on file  Occupational History   Occupation: Disability  Tobacco Use   Smoking status: Former    Packs/day: 1.50    Years: 29.00    Additional pack years: 0.00    Total pack years: 43.50    Types: Cigarettes    Quit date: 10/22/2020    Years since quitting: 1.7   Smokeless tobacco: Never   Tobacco comments:    Quit smoking September 2022.  Using Nicoderm patch currently.  Vaping Use   Vaping Use: Never used  Substance and Sexual Activity   Alcohol use: No   Drug use: No   Sexual activity: Not Currently  Other Topics Concern   Not on file  Social History Narrative   He lives with his sister and her son.   Walks every day to work at least 15+ minutes.   Social Determinants of Health   Financial Resource Strain: Medium Risk (02/18/2021)   Overall Financial Resource Strain (CARDIA)    Difficulty of Paying Living Expenses: Somewhat hard  Food Insecurity: No Food Insecurity (02/18/2021)   Hunger Vital Sign    Worried About Running Out of Food in the Last Year: Never true    Ran Out of Food in the Last Year: Never true  Transportation Needs: No Transportation Needs (02/18/2021)  PRAPARE - Administrator, Civil Service  (Medical): No    Lack of Transportation (Non-Medical): No  Physical Activity: Inactive (02/18/2021)   Exercise Vital Sign    Days of Exercise per Week: 0 days    Minutes of Exercise per Session: 0 min  Stress: Stress Concern Present (02/18/2021)   [employer] of Occupational Health - Occupational Stress Questionnaire    Feeling of Stress : To some extent  Social Connections: Unknown (02/18/2021)   Social Connection and Isolation Panel [NHANES]    Frequency of Communication with Friends and Family: More than three times a week    Frequency of Social Gatherings with Friends and Family: More than three times a week    Attends Religious Services: Not on Marketing executive or Organizations: Not on file    Attends Banker Meetings: Not on file    Marital Status: Never married  Intimate Partner Violence: Not on file    PHYSICAL EXAM  GENERAL EXAM/CONSTITUTIONAL: Vitals:  Vitals:   07/13/22 1426  BP: 117/79  Pulse: 69  Resp: 15  Height: 5\' 8"  (1.727 m)   Body mass index is 27.19 kg/m. Wt Readings from Last 3 Encounters:  03/18/22 178 lb 12.8 oz (81.1 kg)  02/11/22 173 lb (78.5 kg)  01/04/22 169 lb 6.4 oz (76.8 kg)   Patient is in no distress; well developed, nourished and groomed; neck is supple  MUSCULOSKELETAL: Gait, strength, tone, movements noted in Neurologic exam below  NEUROLOGIC: MENTAL STATUS:      No data to display         awake, alert, oriented to person, place and time recent and remote memory intact Difficulty with calculating the number of quarters in $1.75. Need multiple prompts to state the days of the week backward.  language fluent, comprehension intact, naming intact fund of knowledge appropriate  CRANIAL NERVE:  2nd, 3rd, 4th, 6th- visual fields full to confrontation, extraocular muscles intact, no nystagmus 5th - facial sensation symmetric 7th - facial strength symmetric 8th - hearing intact 9th - palate  elevates symmetrically, uvula midline 11th - shoulder shrug symmetric 12th - tongue protrusion midline  MOTOR:  normal bulk and tone, full strength in the BUE, RLE, AKA LLE.  SENSORY:  normal and symmetric to light touch  COORDINATION:  finger-nose-finger, fine finger movements normal  GAIT/STATION:  Deferred, in a wheelchair      DIAGNOSTIC DATA (LABS, IMAGING, TESTING) - I reviewed patient records, labs, notes, testing and imaging myself where available.  Lab Results  Component Value Date   WBC 5.0 12/01/2021   HGB 12.8 (L) 12/01/2021   HCT 37.6 (L) 12/01/2021   MCV 69.2 (L) 12/01/2021   PLT 308 12/01/2021      Component Value Date/Time   NA 138 12/01/2021 0719   NA 141 02/24/2021 1139   K 3.9 12/01/2021 0719   CL 107 12/01/2021 0719   CO2 21 (L) 12/01/2021 0719   GLUCOSE 108 (H) 12/01/2021 0719   BUN 12 12/01/2021 0719   BUN 10 02/24/2021 1139   CREATININE 1.00 12/01/2021 0719   CREATININE 1.02 01/20/2017 0908   CALCIUM 9.1 12/01/2021 0719   PROT 6.6 07/22/2021 0450   PROT 6.5 02/24/2021 1139   ALBUMIN 3.6 07/22/2021 0450   ALBUMIN 4.1 02/24/2021 1139   AST 16 07/22/2021 0450   ALT 12 07/22/2021 0450   ALKPHOS 140 (H) 07/22/2021 0450   BILITOT 0.3 07/22/2021 0450  BILITOT 0.3 02/24/2021 1139   GFRNONAA >60 12/01/2021 0719   GFRAA >60 11/14/2019 0508   Lab Results  Component Value Date   CHOL 193 02/24/2021   HDL 48 02/24/2021   LDLCALC 118 (H) 02/24/2021   TRIG 152 (H) 02/24/2021   CHOLHDL 4.0 02/24/2021   Lab Results  Component Value Date   HGBA1C 6.1 (H) 02/24/2021   No results found for: "VITAMINB12" Lab Results  Component Value Date   TSH 1.130 05/15/2019      ASSESSMENT AND PLAN  62 y.o. year old male with hypertension, hyperlipidemia, asthma and anxiety who is presenting with memory decline and hallucinations.  Memory decline described as being forgetful, missing his appointment, forgetful about recent conversation with his  sister.  On exam today she scored 22 out of 30 on the MoCA indicative of impairment.  Plan will be to obtain a dementia labs including B12, TSH and ATN profile to look for Alzheimer disease biomarker.  I will also obtain a MRI of his brain.   On exam today he did not have any parkinsonism but reports hallucinations.  Will continue to monitor the symptoms and based on the MRI results, we will consider additional testing if needed.  I will contact the patient to go over the result.  Continue to follow-up with your PCP and return sooner if worse     1. Mild cognitive impairment      Patient Instructions  Dementia labs including B12, TSH and ATN profile to look for Alzheimer disease biomarker MRI brain without contrast Continue your other medications Follow-up with your PCP Return in 1 year or sooner if worse.   There are well-accepted and sensible ways to reduce risk for Alzheimers disease and other degenerative brain disorders .  Exercise Daily Walk A daily 20 minute walk should be part of your routine. Disease related apathy can be a significant roadblock to exercise and the only way to overcome this is to make it a daily routine and perhaps have a reward at the end (something your loved one loves to eat or drink perhaps) or a personal trainer coming to the home can also be very useful. Most importantly, the patient is much more likely to exercise if the caregiver / spouse does it with him/her. In general a structured, repetitive schedule is best.  General Health: Any diseases which effect your body will effect your brain such as a pneumonia, urinary infection, blood clot, heart attack or stroke. Keep contact with your primary care doctor for regular follow ups.  Sleep. A good nights sleep is healthy for the brain. Seven hours is recommended. If you have insomnia or poor sleep habits we can give you some instructions. If you have sleep apnea wear your mask.  Diet: Eating a heart healthy diet  is also a good idea; fish and poultry instead of red meat, nuts (mostly non-peanuts), vegetables, fruits, olive oil or canola oil (instead of butter), minimal salt (use other spices to flavor foods), whole grain rice, bread, cereal and pasta and wine in moderation.Research is now showing that the MIND diet, which is a combination of The Mediterranean diet and the DASH diet, is beneficial for cognitive processing and longevity. Information about this diet can be found in The MIND Diet, a book by Alonna Minium, MS, RDN, and online at [employer].es  Finances, Power of [address] and Advance Directives: You should consider putting legal safeguards in place with regard to financial and medical decision making. While the spouse  always has power of attorney for medical and financial issues in the absence of any form, you should consider what you want in case the spouse / caregiver is no longer around or capable of making decisions.     Orders Placed This Encounter  Procedures   MR BRAIN WO CONTRAST   Vitamin B12   TSH   ATN PROFILE    No orders of the defined types were placed in this encounter.   Return in about 1 year (around 07/13/2023).  I have spent a total of 60 minutes dedicated to this patient today, preparing to see patient, performing a medically appropriate examination and evaluation, ordering tests and/or medications and procedures, and counseling and educating the patient/family/caregiver; independently interpreting result and communicating results to the family/patient/caregiver; and documenting clinical information in the electronic medical record.   Windell Norfolk, MD 07/13/2022, 5:04 PM  [facility] [address], [address] [place], [place] [address] [phone]

## 2022-07-16 LAB — TSH: TSH: 0.842 u[IU]/mL (ref 0.450–4.500)

## 2022-07-16 LAB — ATN PROFILE
A -- Beta-amyloid 42/40 Ratio: 0.124 (ref >0.102)
Beta-amyloid 40: 212.05 pg/mL
Beta-amyloid 42: 26.26 pg/mL
N -- NfL, Plasma: 2.11 pg/mL (ref 0.00–4.61)
T -- p-tau181: 0.69 pg/mL (ref 0.00–0.97)

## 2022-07-16 LAB — VITAMIN B12: Vitamin B-12: 398 pg/mL (ref 232–1245)

## 2022-07-16 NOTE — Progress Notes (Signed)
Please call and inform patient that all his dementia labs were within normal limits except for a low Vitamin B12 level. I would recommend over the counter Vitamin B12 complex supplement to take daily. No further action are required on these tests at this time. Please keep any upcoming appointments or tests and call us with any interim questions, concerns, problems or updates. Thanks,   Windell Norfolk, MD

## 2022-07-19 ENCOUNTER — Telehealth: Payer: Self-pay

## 2022-07-19 NOTE — Telephone Encounter (Signed)
-----   Message from Amadou Camara, MD sent at 07/16/2022  1:21 PM EDT ----- Please call and inform patient that all his dementia labs were within normal limits except for a low Vitamin B12 level. I would recommend over the counter Vitamin B12 complex supplement to take daily. No further action are required on these tests at this time. Please keep any upcoming appointments or tests and call us with any interim questions, concerns, problems or updates. Thanks,   Amadou Camara, MD    

## 2022-07-19 NOTE — Telephone Encounter (Signed)
Unable to leave msg.

## 2022-07-20 ENCOUNTER — Telehealth: Payer: Self-pay

## 2022-07-20 NOTE — Telephone Encounter (Signed)
I attempted to call pt and VM has not been set up. Will mail results as this is the third contact attempt that has been made

## 2022-07-20 NOTE — Telephone Encounter (Signed)
-----   Message from Windell Norfolk, MD sent at 07/16/2022  1:21 PM EDT ----- Please call and inform patient that all his dementia labs were within normal limits except for a low Vitamin B12 level. I would recommend over the counter Vitamin B12 complex supplement to take daily. No further action are required on these tests at this time. Please keep any upcoming appointments or tests and call us with any interim questions, concerns, problems or updates. Thanks,   Windell Norfolk, MD

## 2022-11-11 ENCOUNTER — Other Ambulatory Visit: Payer: Self-pay

## 2022-12-06 NOTE — Telephone Encounter (Signed)
Benjamin [name]: [id number] exp. 12/06/22-02/03/23

## 2022-12-09 ENCOUNTER — Other Ambulatory Visit: Payer: Medicaid Other

## 2022-12-14 ENCOUNTER — Ambulatory Visit: Payer: Medicaid Other | Attending: Physician Assistant | Admitting: Physician Assistant

## 2022-12-14 ENCOUNTER — Ambulatory Visit
Admission: RE | Admit: 2022-12-14 | Discharge: 2022-12-14 | Disposition: A | Discharge status: Home / Self Care | Payer: Medicaid Other | Source: Ambulatory Visit | Attending: Registered Nurse | Admitting: Registered Nurse

## 2022-12-14 ENCOUNTER — Other Ambulatory Visit: Payer: Self-pay | Admitting: Registered Nurse

## 2022-12-14 DIAGNOSIS — R0989 Other specified symptoms and signs involving the circulatory and respiratory systems: Secondary | ICD-10-CM

## 2022-12-14 NOTE — Progress Notes (Deleted)
Cardiology Office Note   Date:  12/14/2022  ID:  [name], DOB 1960-07-17, MRN [id number] PCP:  Levonne Lapping, NP [facility] Cardiologist: Bryan Lemma, MD  Reason for visit: EKG changes   History of Present Illness    Benjamin [name] is a 62 y.o. male with a hx of CAD, PAD, HTN. Hyperlipidemia.  He was last seen in January 2024 by Dr. Bufford Buttner with complaints of chest pain and lightheadedness.  Coronary CTA was reviewed which showed moderate disease in the RCA and severe distal left circumflex stenosis.  This has been medically managed.  EKG at that appointment showed normal sinus rhythm with anterior lateral T wave inversions -unchanged from prior.  He had borderline blood pressure.  Therefore, amlodipine and losartan were continued.  Echo ordered.  Today, ***  Coronary artery disease -Coronary CTA: calcium score of 844 (94th percentile), extensive plaque in the p-mid LAD, moderate stenosis in RCA, extensive left circumflex disease extending into the L PDA, FFR was inconclusive.  -***  Peripheral artery disease - S/p multiple interventions, s/p L AKA in 11/2020,  follows with vascular surgery - Dr. Karin Lieu.    -***  Hypertension -*** -Goal BP is <130/80.  Recommend DASH diet (high in vegetables, fruits, low-fat dairy products, whole grains, poultry, fish, and nuts and low in sweets, sugar-sweetened beverages, and red meats), salt restriction and increase physical activity.  Hyperlipidemia -*** -Recommend cholesterol lowering diets - Mediterranean diet, DASH diet, vegetarian diet, low-carbohydrate diet and avoidance of trans fats.  Discussed healthier choice substitutes.  Nuts, high-fiber foods, and fiber supplements may also improve lipids.    Obesity -Even a 5-10% weight loss can have cardiovascular benefits.   -Recommend moderate intensity activity for 30 minutes 5 days/week and the DASH diet.  Tobacco use  -Recommend tobacco cessation.  Reviewed physiologic  effects of nicotine and the immediate-eventual benefits of quitting including improvement in cough/breathing and reduction in cardiovascular events.  Discussed quitting tips such as removing triggers and getting support from family/friends and Quitline [place]. -USPSTF recommends one-time screening for abdominal aortic aneurysm (AAA) by ultrasound in men 75 -53 years old who have ever smoked.      Disposition - Follow-up in ***       Objective / Physical Exam   EKG today: ***  Vital signs:  There were no vitals taken for this visit.    GEN: No acute distress NECK: No carotid bruits CARDIAC: ***RRR, no murmurs RESPIRATORY:  Clear to auscultation without rales, wheezing or rhonchi  EXTREMITIES: No edema  Assessment and Plan   ***   {Are you ordering a CV Procedure (e.g. stress test, cath, DCCV, TEE, etc)?   Press F2        :[id number]}    Signed, Bernette Mayers  12/14/2022 [facility]

## 2022-12-30 ENCOUNTER — Ambulatory Visit: Payer: Medicaid Other | Attending: General Practice | Admitting: General Practice

## 2022-12-30 ENCOUNTER — Encounter: Payer: Self-pay | Admitting: General Practice

## 2022-12-30 VITALS — BP 132/88 | HR 58 | Ht 68.0 in | Wt 182.8 lb

## 2022-12-30 DIAGNOSIS — R002 Palpitations: Secondary | ICD-10-CM

## 2022-12-30 DIAGNOSIS — E782 Mixed hyperlipidemia: Secondary | ICD-10-CM

## 2022-12-30 DIAGNOSIS — I10 Essential (primary) hypertension: Secondary | ICD-10-CM | POA: Diagnosis not present

## 2022-12-30 DIAGNOSIS — I25118 Atherosclerotic heart disease of native coronary artery with other forms of angina pectoris: Secondary | ICD-10-CM

## 2022-12-30 NOTE — Patient Instructions (Signed)
Medication Instructions:  The current medical regimen is effective;  continue present plan and medications as directed. Please refer to the Current Medication list given to you today.  *If you need a refill on your cardiac medications before your next appointment, please call your pharmacy*  Lab Work: HAVE YOUR PRIMARY MD FAX [name] LAB RESULTS [phone]  Testing/Procedures: NONE  Follow-Up: At [facility], you and your health needs are our priority.  As part of our continuing mission to provide you with exceptional heart care, we have created designated Provider Care Teams.  These Care Teams include your primary Cardiologist (physician) and Advanced Practice Providers (APPs -  Physician Assistants and Nurse Practitioners) who all work together to provide you with the care you need, when you need it.  We recommend signing up for the patient portal called "MyChart".  Sign up information is provided on this After Visit Summary.  MyChart is used to connect with patients for Virtual Visits (Telemedicine).  Patients are able to view lab/test results, encounter notes, upcoming appointments, etc.  Non-urgent messages can be sent to your provider as well.   To learn more about what you can do with MyChart, go to [email or web address].    Your next appointment:   9-12 month(s)  Provider:   Bryan Lemma, MD  or Edd Fabian, FNP        Other Instructions PLEASE READ AND FOLLOW ATTACHED  SALTY 6

## 2022-12-30 NOTE — Progress Notes (Signed)
Cardiology Clinic Note   Patient Name: Benjamin [name] Date of Encounter: 12/30/2022  Primary Care Provider:  Levonne Lapping, NP Primary Cardiologist:  Benjamin Lemma, MD  Patient Profile    Benjamin [name] 62 year old male presents to the clinic today for evaluation of his EKG changes.  Past Medical History    Past Medical History:  Diagnosis Date   Back pain    Diabetes mellitus without complication (HCC)    Heavy smoker    Hyperlipidemia LDL goal <70    Hypertension    Memory loss    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   Bilateral femoral artery disease   Past Surgical History:  Procedure Laterality Date   ABDOMINAL AORTOGRAM W/LOWER EXTREMITY N/A 07/12/2016   Procedure: Abdominal Aortogram w/Lower Extremity;  Surgeon: Benjamin Libman, MD;  Location: [facility];  Service: Cardiovascular;  Laterality: N/A;  Bilateral extermity: Patent Renal As. No sig Dz in infrarenal Abd Aorta. Normal Bilat Iliac arteries. R SFA is 100% @ origin - recon in AK-Pop A. R PT A patent. L CFA occluded. L PFA recon @ origin, L SFA occluded w/ recon in AK Pop A.   ABDOMINAL AORTOGRAM W/LOWER EXTREMITY N/A 10/15/2019   Procedure: ABDOMINAL AORTOGRAM W/LOWER EXTREMITY;  Surgeon: Benjamin Libman, MD;  Location: [facility];  Service: Cardiovascular;  Laterality: N/A;   AMPUTATION Left 10/04/2020   Procedure: LEFT ABOVE KNEE AMPUTATION;  Surgeon: Benjamin Libman, MD;  Location[facility] OR;  Service: Vascular;  Laterality: Left;   AMPUTATION Left 12/08/2020   Procedure: RIGHT ABOVE KNEE AMPUTATION REVISION;  Surgeon: Benjamin Hint, MD;  Location: [facility] OR;  Service: Vascular;  Laterality: Left;   AORTA - BILATERAL FEMORAL ARTERY BYPASS GRAFT N/A 05/08/2020   Procedure: AORTA BIFEMORAL BYPASS GRAFT;  Surgeon: Benjamin Libman, MD;  Location: [facility] OR;  Service: Vascular;  Laterality: N/A;   COLONOSCOPY  06/2016   never   ENDARTERECTOMY FEMORAL Left 11/14/2019   Procedure:  Left groin exploration, Redo left femoral artery exposure;  Surgeon: Benjamin Libman, MD;  Location: [facility] OR;  Service: Vascular;  Laterality: Left;   FEMORAL-FEMORAL BYPASS GRAFT N/A 11/13/2019   Procedure: BYPASS GRAFT FEMORAL-FEMORAL ARTERY RIGHT TO LEFT USING HEMASHIELD GOLD GRAFT 8mm x 30cm;  Surgeon: Benjamin Libman, MD;  Location: [facility] OR;  Service: Vascular;  Laterality: N/A;   FEMORAL-POPLITEAL BYPASS GRAFT Left 11/13/2019   Procedure: LEFT FEMORAL BELOW KNEE-POPLITEAL ARTERY USING NON-REVERSED GREATER SAPHENOUS VEIN;  Surgeon: Benjamin Libman, MD;  Location: [facility] OR;  Service: Vascular;  Laterality: Left;   FEMORAL-POPLITEAL BYPASS GRAFT Left 05/08/2020   Procedure: LEFT REDO BYPASS GRAFT COMMON FEMORAL- BELOW KNEE POPLITEAL ARTERY USING PROPATEN GRAFT;  Surgeon: Benjamin Libman, MD;  Location: [facility] OR;  Service: Vascular;  Laterality: Left;   I & D EXTREMITY Left 09/07/2020   Procedure: IRRIGATION AND DEBRIDEMENT LEFT LEG WITH EXCISION OF LEFT LEG DISTAL BYPASS GRAFT;  Surgeon: Benjamin Shelling, MD;  Location: [facility] OR;  Service: Vascular;  Laterality: Left;   LOWER EXTREMITY ANGIOGRAM Left 11/14/2019   Procedure: LEFT LOWER EXTREMITY ANGIOGRAM, BYPASS GRAFT ANGIOPLASTY;  Surgeon: Benjamin Libman, MD;  Location: [facility] OR;  Service: Vascular;  Laterality: Left;   PERIPHERAL VASCULAR INTERVENTION  10/15/2019   Procedure: PERIPHERAL VASCULAR INTERVENTION;  Surgeon: Benjamin Libman, MD;  [facility]: [facility];  Service: Cardiovascular;;  Rt Iliac   REMOVAL OF GRAFT Left 10/04/2020   Procedure: REMOVAL  OF LEFT FEMORAL TO POPITEAL BYPASS GRAFT;  Surgeon: Benjamin Libman, MD;  Location: [facility] OR;  Service: Vascular;  Laterality: Left;    Allergies  Allergies  Allergen Reactions   Gabapentin Other (See Comments)    Causes lethargy   Tetanus Toxoids Other (See Comments)    Tinnitus started after he took this   Aspirin Nausea And Vomiting    Other Reaction(s): GI Intolerance   Garlic  Other (See Comments)    Sneezing     History of Present Illness    Benjamin [name] has a PMH of coronary artery disease, PAD, HTN, HLD, palpitations, and dizziness.  He has history of left AKA.  His right ABI was noted to be 0.83.  He underwent coronary CTA 09/14/2021 which showed ostial LAD 25-49%, left main 25-49%, mid LAD 25-49%, circumflex 50-69%, distal circumflex 70-99%, RCA with mixed plaque 50-69% proximal.  He had a coronary calcium score of 844 which places him in the 94th percentile.  His coronary CTA was reviewed and felt to not have evidence of diffuse moderate CAD.  Medical management was recommended.  He was seen in follow-up by Benjamin [name] on 03/18/2022.  His blood pressure was noted to be 113/66.  It was felt that he was overmedicated.  He denied further chest pains.  His coronary CTA was reviewed.  His chest discomfort symptoms had resolved.  His EKG showed sinus rhythm with a heart rate of 60.  His atorvastatin was increased.  His EKG showed sinus rhythm with inferior lateral T wave inversions that were not new.  His PCP noted EKG changes and recommended that he follow-up with cardiology.  He presents to the clinic today for follow-up evaluation and states he does note some increased work of breathing with increased physical activity such as using his walker in the morning.  He denies increased work of breathing with routine activities.  We reviewed his EKG which shows no changes from previous EKGs.  He denies episodes of chest discomfort.  His blood pressure today is well-controlled at 132/88.  He does note that he will have repeat cholesterol labs drawn with his PCP.  I recommended that he continue to increase his physical activity as tolerated and follow a heart healthy low-sodium diet.  Follow-up was planned in 9 to 12 months.  Today he denies chest pain, shortness of breath, lower extremity edema, fatigue, palpitations, melena, hematuria, hemoptysis, diaphoresis, weakness, presyncope,  syncope, orthopnea, and PND.   Home Medications    Prior to Admission medications   Medication Sig Start Date End Date Taking? Authorizing Provider  acetaminophen (TYLENOL) 500 MG tablet Take by mouth. 05/15/20   [provider]  albuterol (VENTOLIN HFA) 108 (90 Base) MCG/ACT inhaler Inhale 1-2 puffs into the lungs every 6 (six) hours as needed for wheezing or shortness of breath. 06/12/21   Tanda Rockers A, DO  atorvastatin (LIPITOR) 20 MG tablet Take by mouth. 10/29/20   [provider]  atorvastatin (LIPITOR) 40 MG tablet Take 40 mg by mouth daily. 03/16/22   [provider]  buPROPion (WELLBUTRIN XL) 150 MG 24 hr tablet Take by mouth. 07/14/20   [provider]  carvedilol (COREG) 12.5 MG tablet Take 12.5 mg by mouth 2 (two) times daily. 03/16/22   [provider]  carvedilol (COREG) 6.25 MG tablet  07/05/21   [provider]  cloNIDine (CATAPRES - DOSED IN MG/24 HR) 0.1 mg/24hr patch Place 1 patch (0.1 mg total) onto the skin once  a week. 07/05/21   Raulkar, Drema Pry, MD  clopidogrel (PLAVIX) 75 MG tablet Take by mouth. 10/29/20   [provider]  famotidine (PEPCID) 20 MG tablet Take 20 mg by mouth 2 (two) times daily. 03/16/22   [provider]  FLUoxetine (PROZAC) 10 MG capsule Take 10 mg by mouth daily. 03/10/22   [provider]  FLUoxetine (PROZAC) 20 MG capsule Take 20 mg by mouth daily. 03/16/22   [provider]  gabapentin (NEURONTIN) 400 MG capsule Take by mouth. 10/29/20   [provider]  isosorbide dinitrate (ISORDIL) 30 MG tablet Take 30 mg by mouth daily. 03/16/22   [provider]  isosorbide mononitrate (IMDUR) 30 MG 24 hr tablet Take 1 tablet (30 mg total) by mouth once daily. 07/14/21     losartan (COZAAR) 25 MG tablet Take by mouth. 11/18/20   [provider]  losartan (COZAAR) 50 MG tablet Take 1 tablet (50 mg total) by mouth in the morning and at bedtime. 07/05/21    Raulkar, Drema Pry, MD  nitroGLYCERIN (NITROSTAT) 0.4 MG SL tablet Place 1 tablet (0.4 mg total) under the tongue every 5 (five) minutes as needed for chest pain. 08/20/21   Joylene Grapes, NP  phenylephrine (SUDAFED PE) 10 MG TABS tablet Take by mouth. 01/08/21   [provider]  traZODone (DESYREL) 50 MG tablet Take 50 mg by mouth at bedtime. 03/10/22   [provider]  Vitamin D, Ergocalciferol, (DRISDOL) 1.25 MG (50000 UNIT) CAPS capsule Take by mouth. 08/17/20   [provider]    Family History    Family History  Problem Relation Age of Onset   Arthritis Mother    COPD Father    Stroke Father    Multiple sclerosis Sister    He indicated that his mother is deceased. He indicated that his father is deceased. He indicated that his sister is alive. He indicated that his brother is alive.  Social History    Social History   Socioeconomic History   Marital status: Single    Spouse name: Not on file   Number of children: 0   Years of education: 12   Highest education level: Not on file  Occupational History   Occupation: Disability  Tobacco Use   Smoking status: Former    Current packs/day: 0.00    Average packs/day: 1.5 packs/day for 29.0 years (43.5 ttl pk-yrs)    Types: Cigarettes    Start date: 10/23/1991    Quit date: 10/22/2020    Years since quitting: 2.1   Smokeless tobacco: Never   Tobacco comments:    Quit smoking September 2022.  Using Nicoderm patch currently.  Vaping Use   Vaping status: Never Used  Substance and Sexual Activity   Alcohol use: No   Drug use: No   Sexual activity: Not Currently  Other Topics Concern   Not on file  Social History Narrative   He lives with his sister and her son.   Walks every day to work at least 15+ minutes.   Social Determinants of Health   Financial Resource Strain: Medium Risk (02/18/2021)   Overall Financial Resource Strain (CARDIA)    Difficulty of Paying Living Expenses: Somewhat hard   Food Insecurity: No Food Insecurity (02/18/2021)   Hunger Vital Sign    Worried About Running Out of Food in the Last Year: Never true    Ran Out of Food in the Last Year: Never true  Transportation Needs: No  Transportation Needs (02/18/2021)   PRAPARE - Administrator, Civil Service (Medical): No    Lack of Transportation (Non-Medical): No  Physical Activity: Inactive (02/18/2021)   Exercise Vital Sign    Days of Exercise per Week: 0 days    Minutes of Exercise per Session: 0 min  Stress: Stress Concern Present (02/18/2021)   [employer] of Occupational Health - Occupational Stress Questionnaire    Feeling of Stress : To some extent  Social Connections: Unknown (02/18/2021)   Social Connection and Isolation Panel [NHANES]    Frequency of Communication with Friends and Family: More than three times a week    Frequency of Social Gatherings with Friends and Family: More than three times a week    Attends Religious Services: Not on Marketing executive or Organizations: Not on file    Attends Banker Meetings: Not on file    Marital Status: Never married  Intimate Partner Violence: Not on file     Review of Systems    General:  No chills, fever, night sweats or weight changes.  Cardiovascular:  No chest pain, dyspnea on exertion, edema, orthopnea, palpitations, paroxysmal nocturnal dyspnea. Dermatological: No rash, lesions/masses Respiratory: No cough, dyspnea Urologic: No hematuria, dysuria Abdominal:   No nausea, vomiting, diarrhea, bright red blood per rectum, melena, or hematemesis Neurologic:  No visual changes, wkns, changes in mental status. All other systems reviewed and are otherwise negative except as noted above.  Physical Exam    VS:  BP 132/88 (BP Location: Left Arm, Patient Position: Sitting, Cuff Size: Normal)   Pulse (!) 58   Ht 5\' 8"  (1.727 m)   Wt 182 lb 12.8 oz (82.9 kg)   SpO2 94%   BMI 27.79 kg/m  , BMI Body mass  index is 27.79 kg/m. GEN: Well nourished, well developed, in no acute distress. HEENT: normal. Neck: Supple, no JVD, carotid bruits, or masses. Cardiac: RRR, no murmurs, rubs, or gallops. No clubbing, cyanosis, edema.  Radials/DP/PT 2+ and equal bilaterally.  Respiratory:  Respirations regular and unlabored, clear to auscultation bilaterally. GI: Soft, nontender, nondistended, BS + x 4. MS: no deformity or atrophy. Skin: warm and dry, no rash. Neuro:  Strength and sensation are intact. Psych: Normal affect.  Accessory Clinical Findings    Recent Labs: 07/13/2022: TSH 0.842   Recent Lipid Panel    Component Value Date/Time   CHOL 193 02/24/2021 1139   TRIG 152 (H) 02/24/2021 1139   HDL 48 02/24/2021 1139   CHOLHDL 4.0 02/24/2021 1139   CHOLHDL 3.9 09/06/2020 0330   VLDL 18 09/06/2020 0330   LDLCALC 118 (H) 02/24/2021 1139   LDLCALC 104 (H) 01/20/2017 0908         ECG personally reviewed by me today- EKG Interpretation Date/Time:  Friday December 30 2022 14:47:54 EST Ventricular Rate:  58 PR Interval:  158 QRS Duration:  74 QT Interval:  436 QTC Calculation: 428 R Axis:   -34  Text Interpretation: Sinus bradycardia Left axis deviation ST & T wave abnormality, consider inferolateral ischemia When compared with ECG of 01-Dec-2021 07:04, QRS axis Shifted left Confirmed by Edd Fabian [phone]) on 12/30/2022 2:55:48 PM   Coronary CTA 09/14/2021  TECHNIQUE: The patient was scanned on a Siemens 192 slice scanner. Gantry rotation speed was 250 msecs. Collimation was 0.6 mm. A 100 kV prospective scan was triggered in the ascending thoracic aorta at 35-75% of the R-R interval. Average HR during the  scan was 60 bpm. The 3D data set was interpreted on a dedicated work station using MPR, MIP and VRT modes. A total of 80cc of contrast was used.   FINDINGS: Non-cardiac: See separate report from [facility].   No LA appendage thrombus. Pulmonary veins drain normally to  the left atrium.   Calcium Score: 844 Agatston units.   Coronary Arteries: Left dominant with no anomalies   LM: Mixed plaque distal left main with mild (25-49%) stenosis.   LAD system: Mixed plaque ostial LAD with mild (25-49%) stenosis. Mixed plaque in the proximal and mid LAD with diffuse mild (25-49%) stenosis.   Circumflex system: Large dominant LCx provides the left PDA. Mixed plaque proximal LCx, suspect moderate (50-69%) stenosis. Mixed plaque in the distal LCx (leading to the left PDA), suspect severe (70-99%) stenosis.   RCA system: Small, nondominant RCA. Mixed plaque with moderate (50-69%) stenosis in the proximal RCA.   IMPRESSION: 1. Coronary artery calcium score 844 Agatston units. This places the patient in the 94th percentile for age and gender, suggesting high risk for future cardiac events.   2. Extensive plaque in the proximal and mid LAD, but suspect < 50% stenosis at any given point.   3. Nondominant RCA, probably moderate stenosis in the proximal vessel.   4. Extensive LCx disease, suspect moderate stenosis in the proximal LCx, suspect severe stenosis in the distal LCx leading into the left PDA.   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 On: 09/14/2021 15:07        Assessment & Plan   1.  Coronary artery disease-denies recent episodes of chest discomfort.  Underwent coronary CTA which showed diffuse moderate coronary artery disease 7/23.  EKG today shows Sinus bradycardia 58 bpm. Denies exertional chest discomfort Continue current medical therapy Heart healthy low-sodium diet Increase physical activity as tolerated  Hyperlipidemia-LDL 118 on .  Reports compliance with increased dose of atorvastatin.  Denies side effects. Repeat fasting lipids and LFTs- 11/21 with PCP Continue atorvastatin High-fiber diet  Palpitations-denies recent episodes of accelerated or irregular heartbeats. Avoid triggers caffeine,  chocolate, EtOH, dehydration etc. Continue carvedilol  Essential hypertension-BP today 132/88.  No further episodes of lightheadedness, presyncope or syncope. Maintain blood pressure log Continue clonidine, carvedilol, amlodipine, Imdur  Disposition: Follow-up with Dr. Herbie Baltimore or me in 9-12 months.   Thomasene Ripple. Collette Pescador NP-C     12/30/2022, 3:57 PM [facility] [address] Office [phone] Fax [phone]    I spent 14 minutes examining this patient, reviewing medications, and using patient centered shared decision making involving her cardiac care.   I spent greater than 20 minutes reviewing her past medical history,  medications, and prior cardiac tests.

## 2023-01-28 IMAGING — CR DG CHEST 2V
2 series · 2 of 2 positions shown · non-contrast
Comparison: 07/13/2021

CLINICAL DATA: Chest tightness, shortness of breath

EXAM:
CHEST - 2 VIEW

[chest lat]
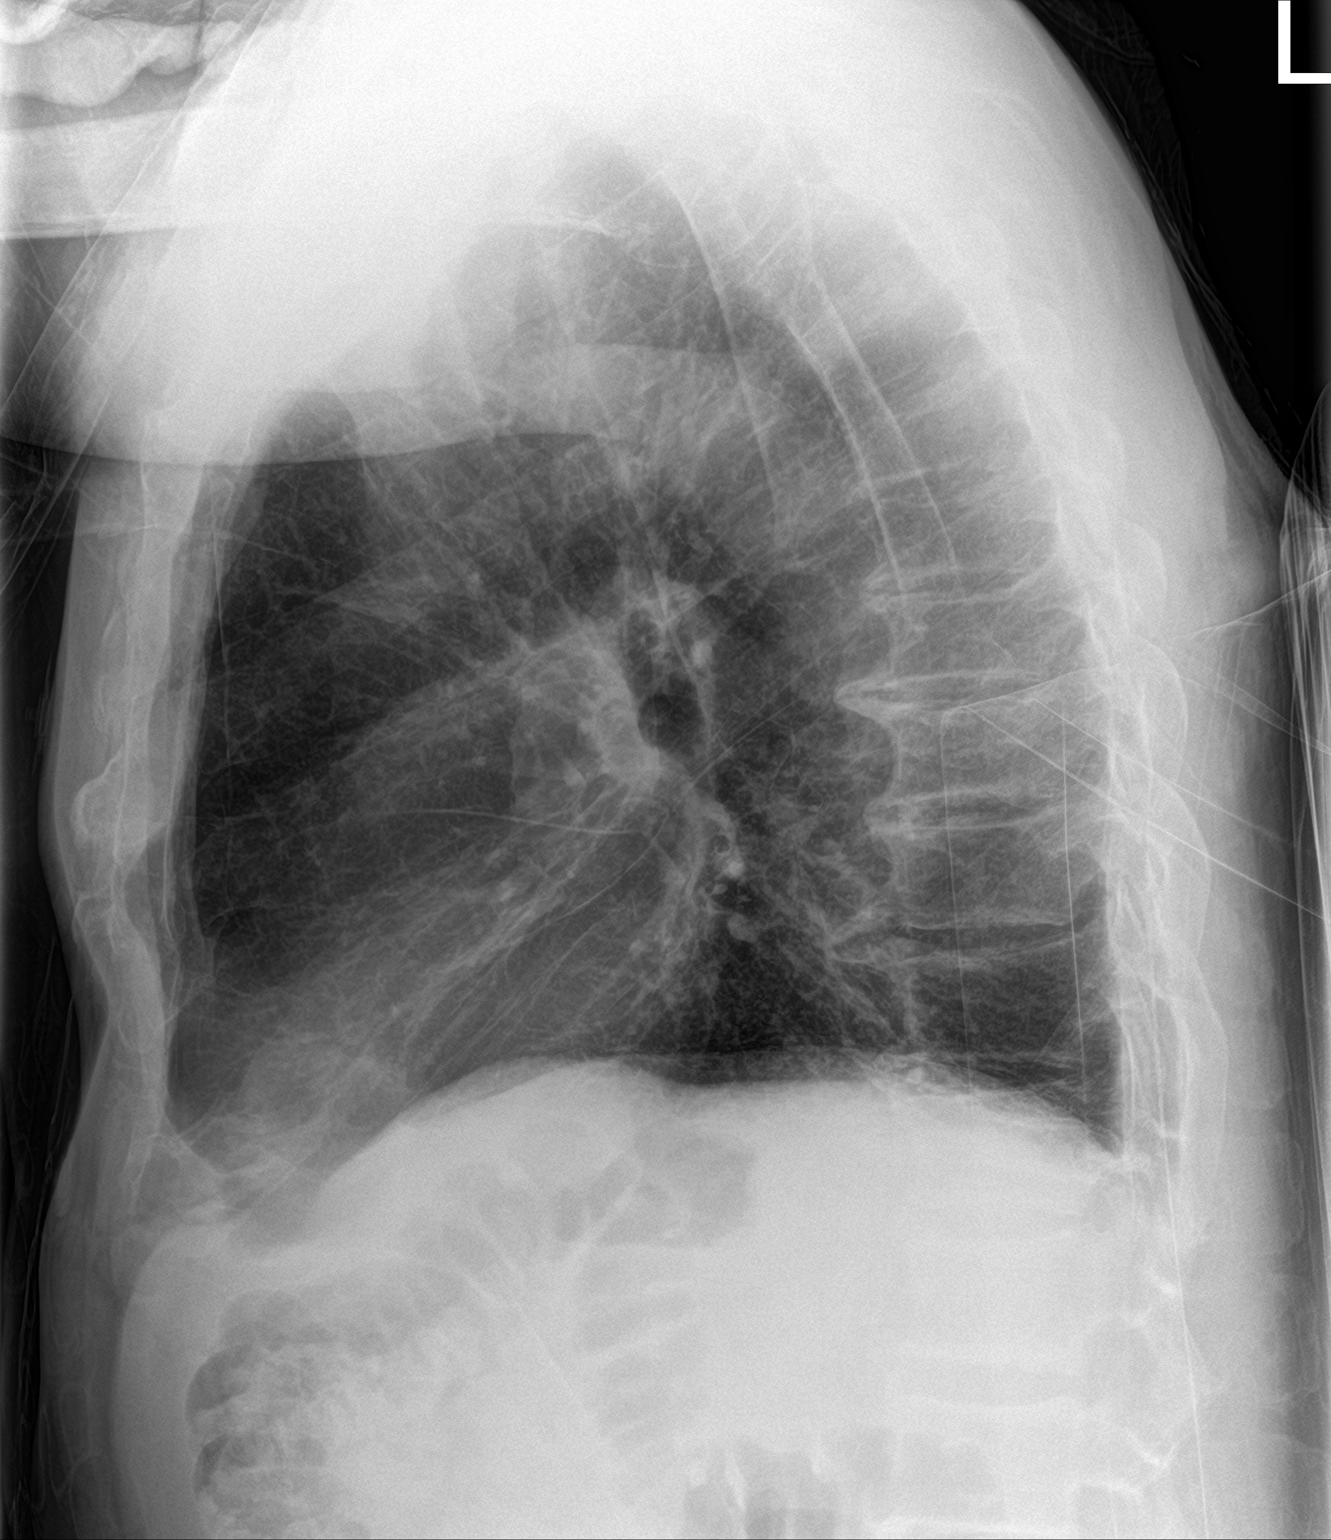

[chest ap]
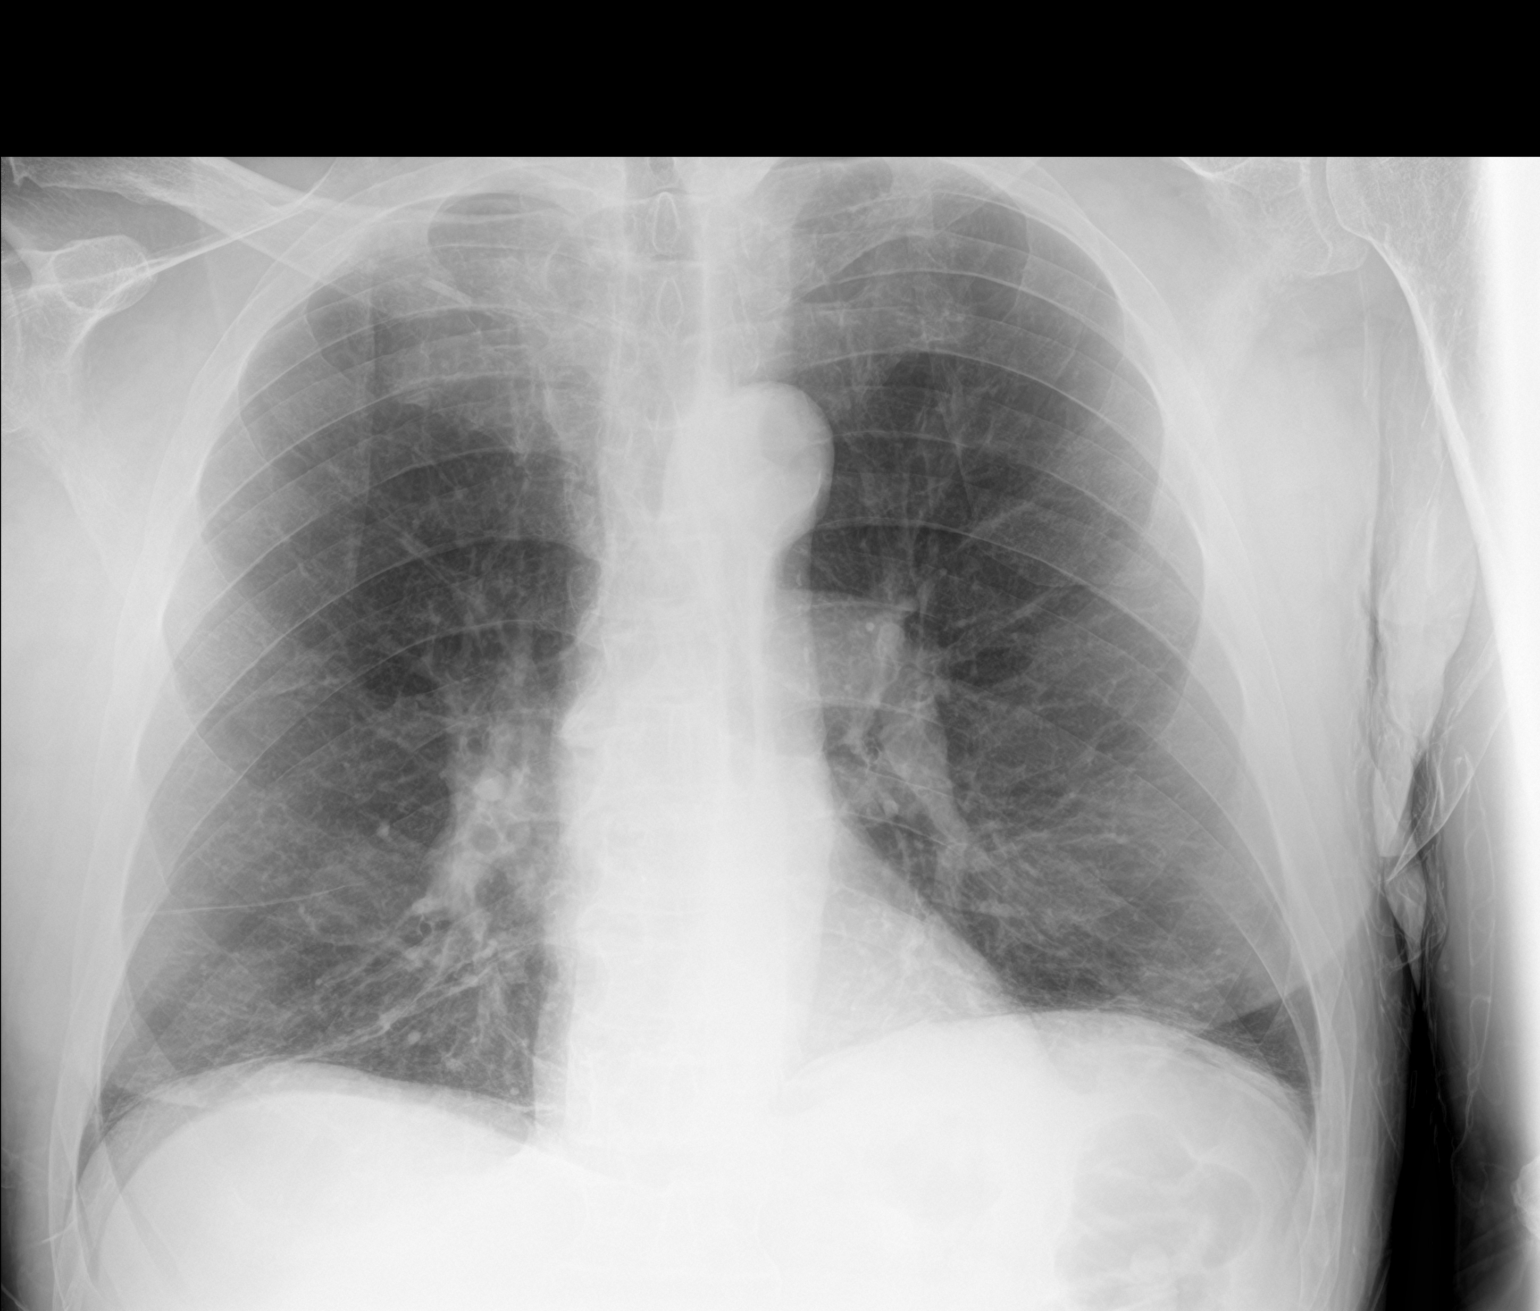

[2 of 2 positions shown; findings below may reference images not displayed]

FINDINGS: Linear scarring in the lung bases. No confluent airspace opacities
or effusions. Heart is normal size. No bony abnormality.
IMPRESSION: Bibasilar scarring.

## 2023-01-28 IMAGING — CT CT ANGIO CHEST
2 of 6 series · 19 of 36 positions shown · IV contrast (agent unspecified)
Comparison: None Available.

CLINICAL DATA: Hypertension with chest pain.  Positive D-dimer.

EXAM:
CT ANGIOGRAPHY CHEST WITH CONTRAST
TECHNIQUE: Multidetector CT imaging of the chest was performed using the
standard protocol during bolus administration of intravenous
contrast. Multiplanar CT image reconstructions and MIPs were
obtained to evaluate the vascular anatomy.

[Series 7: pe thins · axial · 0.76mm/px · z∈[+1121,+1417]mm · 18 of 471 slices shown]
[im 24/471  lung]
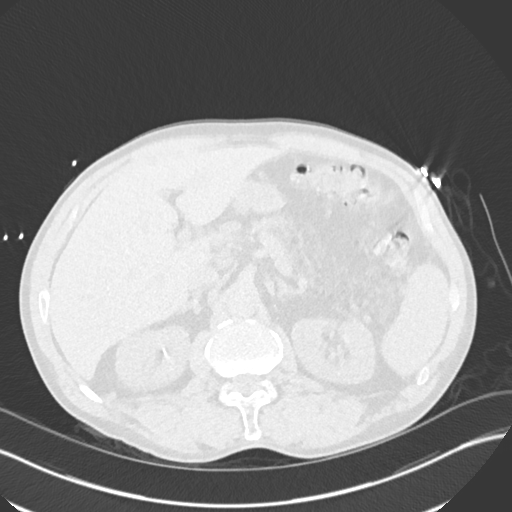
[im 48/471  mediastinal]
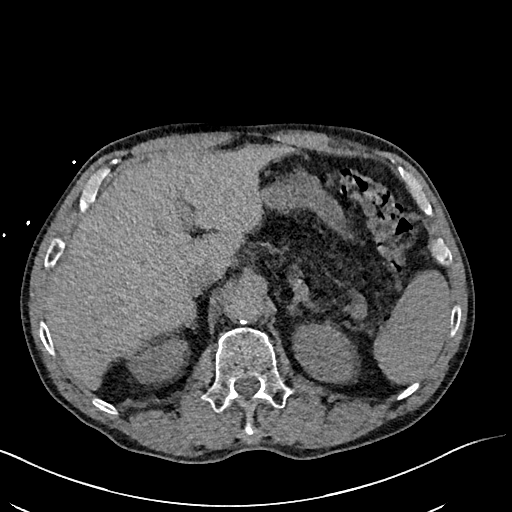
[im 71/471  lung]
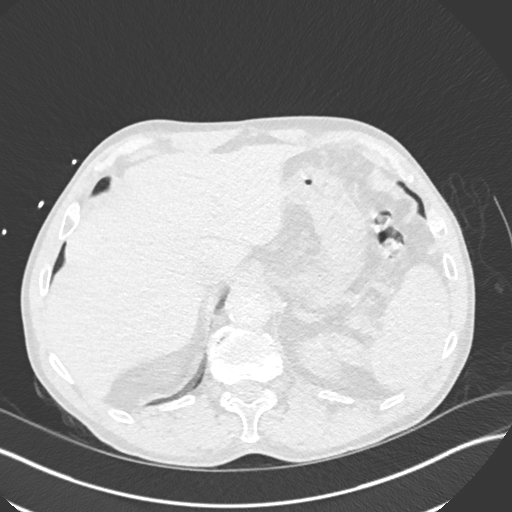
[im 95/471  mediastinal]
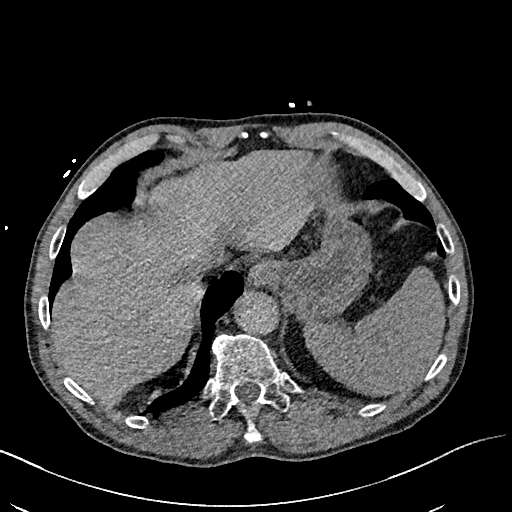
[im 118/471  lung]
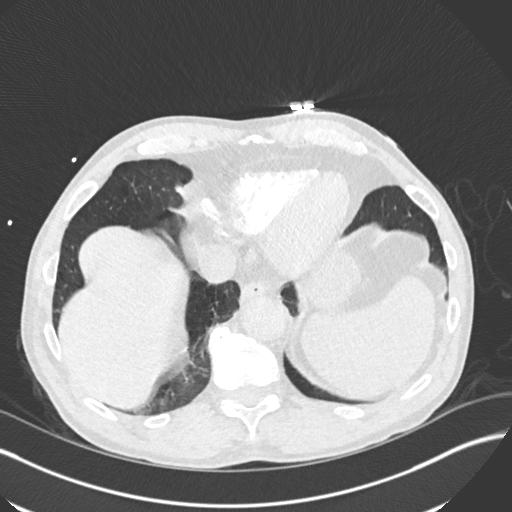
[im 142/471  mediastinal]
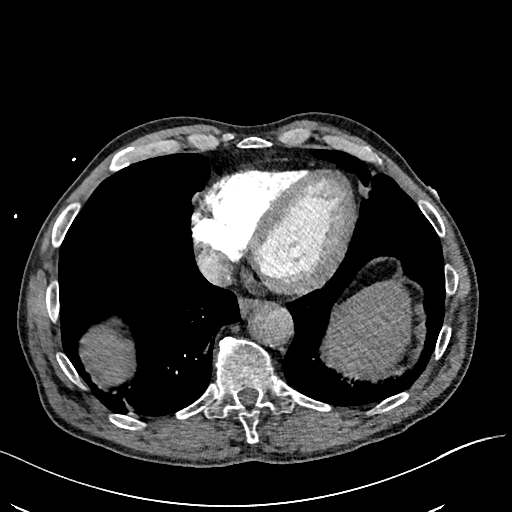
[im 165/471  lung]
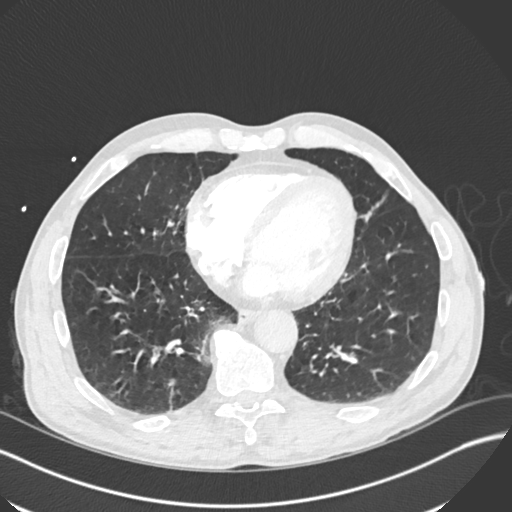
[im 189/471  mediastinal]
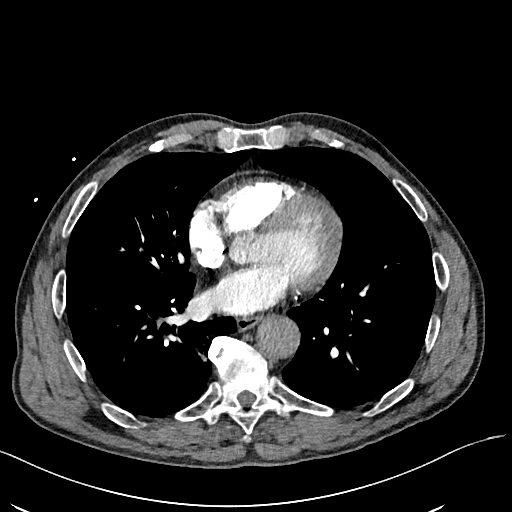
[im 212/471  lung]
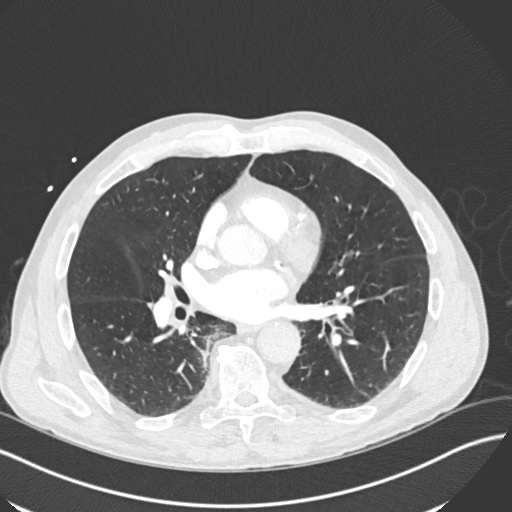
[im 259/471  mediastinal]
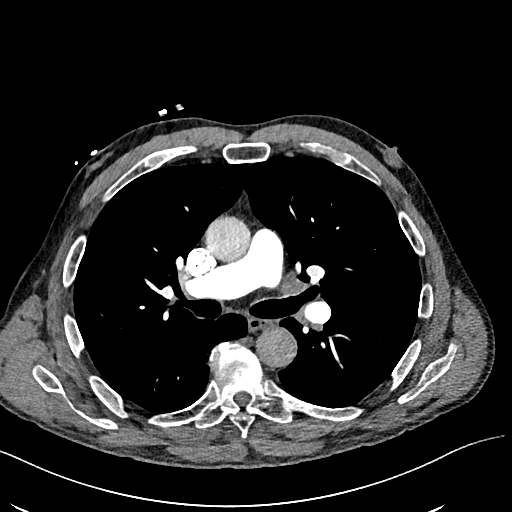
[im 283/471  lung]
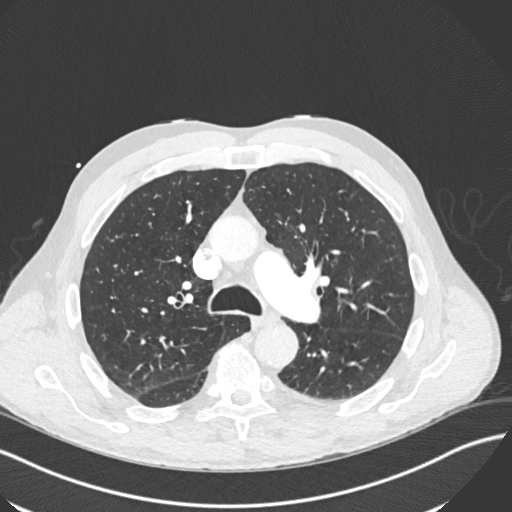
[im 306/471  mediastinal]
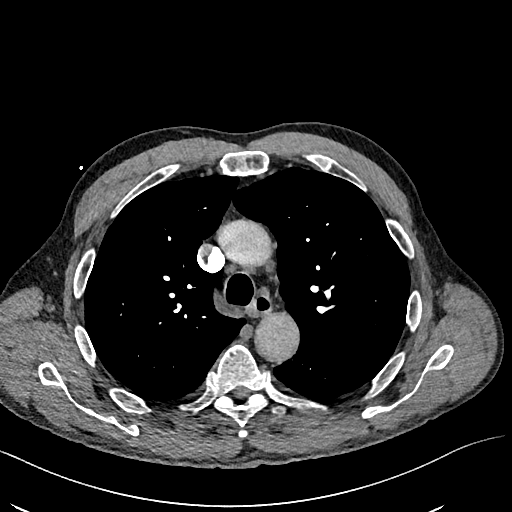
[im 330/471  lung]
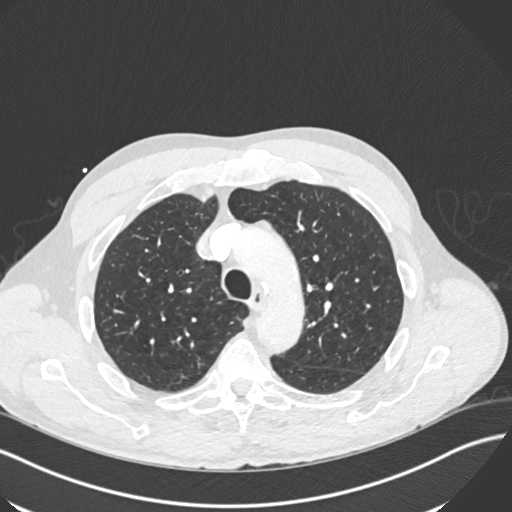
[im 353/471  mediastinal]
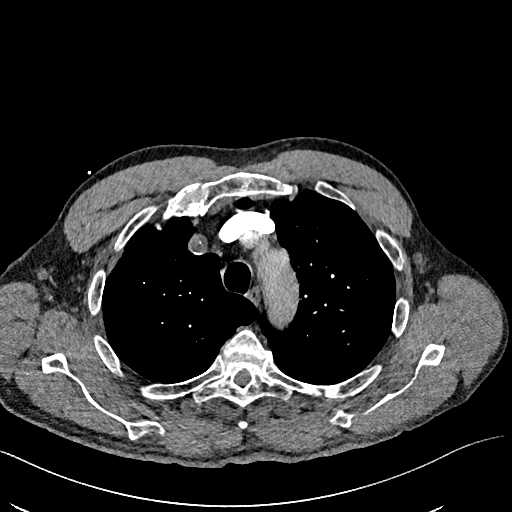
[im 377/471  lung]
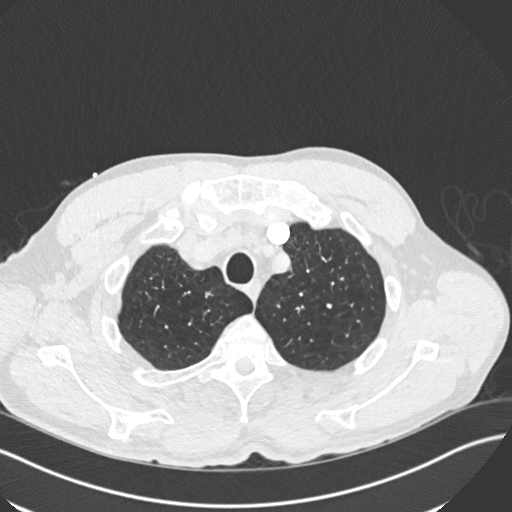
[im 400/471  mediastinal]
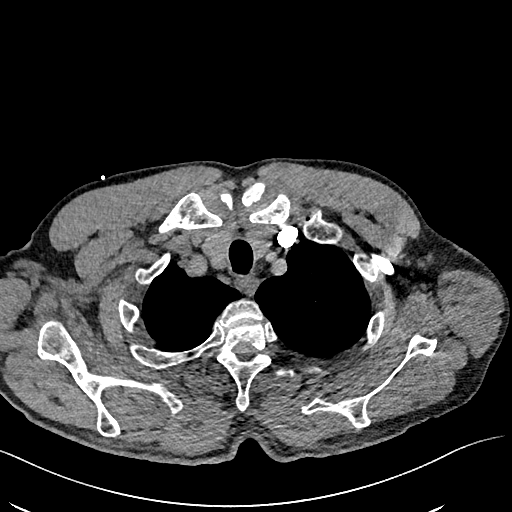
[im 424/471  lung]
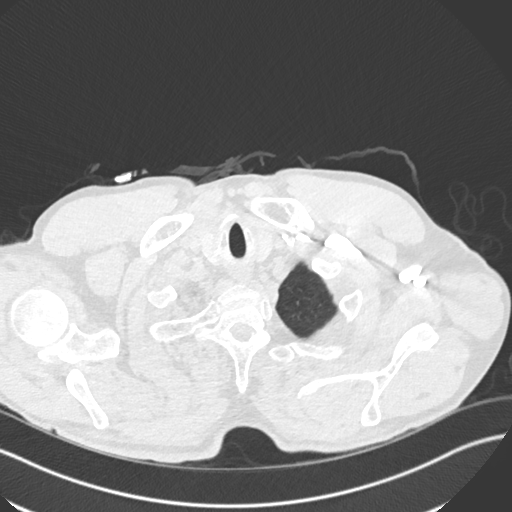
[im 447/471  mediastinal]
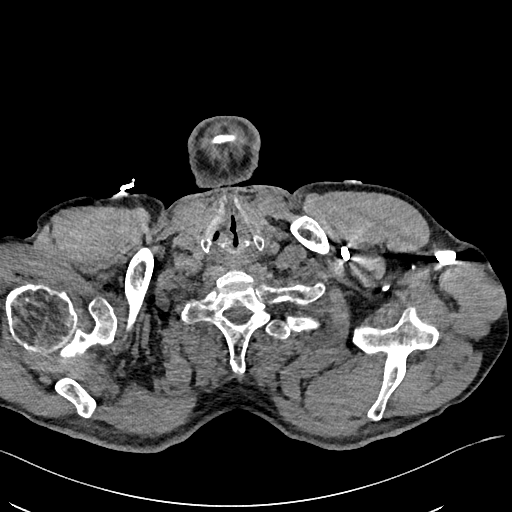

[Series 8: pe 2mm cor · coronal · 0.64mm/px · 1 of 150 slices shown]
[im 75/150  mediastinal]
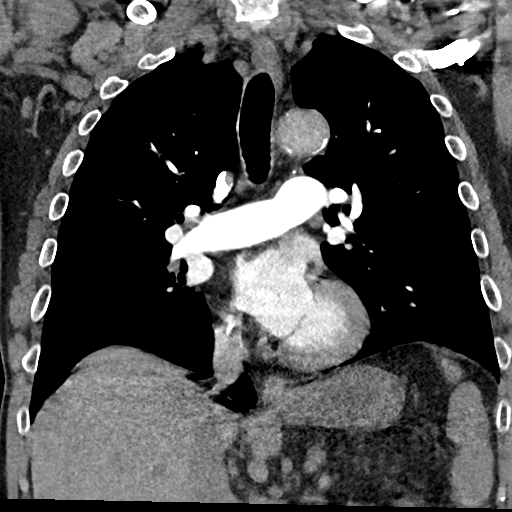

[19 of 36 positions shown; findings below may reference images not displayed]

RADIATION DOSE REDUCTION: This exam was performed according to the
departmental dose-optimization program which includes automated
exposure control, adjustment of the mA and/or kV according to
patient size and/or use of iterative reconstruction technique.

CONTRAST:  75mL OMNIPAQUE IOHEXOL 350 MG/ML SOLN
FINDINGS: Cardiovascular: The heart size is normal. No substantial pericardial
effusion. Coronary artery calcification is evident. Mild
atherosclerotic calcification is noted in the wall of the thoracic
aorta. There is no filling defect within the opacified pulmonary
arteries to suggest the presence of an acute pulmonary embolus.

Mediastinum/Nodes: No mediastinal lymphadenopathy. There is no hilar
lymphadenopathy. The esophagus has normal imaging features. There is
no axillary lymphadenopathy.

Lungs/Pleura: Centrilobular emphsyema noted. No focal airspace
consolidation. No pleural effusion. No suspicious pulmonary nodule
or mass.

Upper Abdomen: Tiny nonobstructing stone identified upper pole right
kidney.

Musculoskeletal: No worrisome lytic or sclerotic osseous
abnormality.

Review of the MIP images confirms the above findings.
IMPRESSION: No CT evidence for acute pulmonary embolus.

Aortic Atherosclerosis (EI1B8-412.2) and Emphysema (EI1B8-MLZ.8).

## 2023-05-30 ENCOUNTER — Other Ambulatory Visit: Payer: Self-pay

## 2023-07-13 ENCOUNTER — Encounter: Payer: Self-pay | Admitting: Neurology

## 2023-07-13 ENCOUNTER — Ambulatory Visit: Payer: Medicaid Other | Admitting: Neurology

## 2023-09-12 ENCOUNTER — Other Ambulatory Visit: Payer: Self-pay

## 2023-09-12 ENCOUNTER — Emergency Department (HOSPITAL_COMMUNITY)
Admission: EM | Admit: 2023-09-12 | Discharge: 2023-09-12 | Disposition: A | Discharge status: Home / Self Care | Attending: Emergency Medicine | Admitting: Emergency Medicine

## 2023-09-12 ENCOUNTER — Encounter (HOSPITAL_COMMUNITY): Payer: Self-pay | Admitting: Emergency Medicine

## 2023-09-12 ENCOUNTER — Emergency Department (HOSPITAL_COMMUNITY)

## 2023-09-12 DIAGNOSIS — Z7982 Long term (current) use of aspirin: Secondary | ICD-10-CM | POA: Diagnosis not present

## 2023-09-12 DIAGNOSIS — R0789 Other chest pain: Secondary | ICD-10-CM | POA: Diagnosis present

## 2023-09-12 DIAGNOSIS — Z79899 Other long term (current) drug therapy: Secondary | ICD-10-CM | POA: Insufficient documentation

## 2023-09-12 DIAGNOSIS — J449 Chronic obstructive pulmonary disease, unspecified: Secondary | ICD-10-CM | POA: Diagnosis not present

## 2023-09-12 DIAGNOSIS — Z89612 Acquired absence of left leg above knee: Secondary | ICD-10-CM | POA: Insufficient documentation

## 2023-09-12 DIAGNOSIS — I10 Essential (primary) hypertension: Secondary | ICD-10-CM | POA: Insufficient documentation

## 2023-09-12 DIAGNOSIS — Z7902 Long term (current) use of antithrombotics/antiplatelets: Secondary | ICD-10-CM | POA: Insufficient documentation

## 2023-09-12 LAB — CBC
HCT: 41.3 % (ref 39.0–52.0)
Hemoglobin: 14.1 g/dL (ref 13.0–17.0)
MCH: 27.3 pg (ref 26.0–34.0)
MCHC: 34.1 g/dL (ref 30.0–36.0)
MCV: 79.9 fL — ABNORMAL LOW (ref 80.0–100.0)
Platelets: 253 K/uL (ref 150–400)
RBC: 5.17 MIL/uL (ref 4.22–5.81)
RDW: 15.8 % — ABNORMAL HIGH (ref 11.5–15.5)
WBC: 6.3 K/uL (ref 4.0–10.5)
nRBC: 0 % (ref 0.0–0.2)

## 2023-09-12 LAB — BASIC METABOLIC PANEL WITH GFR
Anion gap: 9 (ref 5–15)
BUN: 9 mg/dL (ref 8–23)
CO2: 25 mmol/L (ref 22–32)
Calcium: 9.3 mg/dL (ref 8.9–10.3)
Chloride: 106 mmol/L (ref 98–111)
Creatinine, Ser: 1.25 mg/dL — ABNORMAL HIGH (ref 0.61–1.24)
GFR, Estimated: >60 mL/min (ref >60)
Glucose, Bld: 108 mg/dL — ABNORMAL HIGH (ref 70–99)
Potassium: 3.8 mmol/L (ref 3.5–5.1)
Sodium: 140 mmol/L (ref 135–145)

## 2023-09-12 LAB — TROPONIN I (HIGH SENSITIVITY)
Troponin I (High Sensitivity): 5 ng/L (ref <18)
Troponin I (High Sensitivity): 4 ng/L (ref <18)

## 2023-09-12 MED ORDER — IPRATROPIUM-ALBUTEROL 0.5-2.5 (3) MG/3ML IN SOLN
3.0000 mL | Freq: Once | RESPIRATORY_TRACT | Status: AC
  Administered 2023-09-12: 3 mL via RESPIRATORY_TRACT
  Filled 2023-09-12: qty 3

## 2023-09-12 MED ORDER — ALBUTEROL SULFATE HFA 108 (90 BASE) MCG/ACT IN AERS
1.0000 | INHALATION_SPRAY | Freq: Four times a day (QID) | RESPIRATORY_TRACT | 0 refills | Status: AC | PRN
  Filled 2023-09-12: qty 1, fill #0
  Filled 2023-09-12: qty 18, 25d supply, fill #0

## 2023-09-12 NOTE — ED Provider Notes (Signed)
 Early EMERGENCY DEPARTMENT AT [facility] Provider Note   CSN: [id number] Arrival date & time: 09/12/23  9952     Patient presents with: Chest Pain   Benjamin [name] is a 63 y.o. male.   The history is provided by the patient and medical records.  Chest Pain  63 year old male with history of peripheral vascular disease, dyslipidemia, referral arterial disease, hypertension, COPD, presenting to the ED with chest tightness.  Patient states at home today he began feeling some chest tightness and like he was not getting all of his air.  He denies any significant chest pain or feeling short of breath necessarily.  He has not had any recent cough or fever.  Does occasionally do neb treatments at home which seems to help.  He has not had any abdominal pain, nausea, vomiting, or diarrhea.  Prior to Admission medications   Medication Sig Start Date End Date Taking? Authorizing Provider  albuterol  (VENTOLIN  HFA) 108 (90 Base) MCG/ACT inhaler Inhale 1-2 puffs into the lungs every 6 (six) hours as needed for wheezing. 09/12/23  Yes Jarold Olam HERO, PA-C  acetaminophen  (TYLENOL ) 500 MG tablet Take by mouth. Patient not taking: Reported on 12/30/2022 05/15/20   [provider]  aspirin EC 81 MG tablet Take 81 mg by mouth daily. Swallow whole.    [provider]  atorvastatin  (LIPITOR ) 20 MG tablet Take by mouth. Patient not taking: Reported on 12/30/2022 10/29/20   [provider]  atorvastatin  (LIPITOR ) 40 MG tablet Take 40 mg by mouth daily. 03/16/22   [provider]  buPROPion  (WELLBUTRIN  XL) 150 MG 24 hr tablet Take by mouth. Patient not taking: Reported on 12/30/2022 07/14/20   [provider]  carvedilol  (COREG ) 12.5 MG tablet Take 12.5 mg by mouth 2 (two) times daily. 03/16/22   [provider]  carvedilol  (COREG ) 6.25 MG tablet  07/05/21   [provider]  cloNIDine  (CATAPRES  - DOSED IN MG/24 HR) 0.1 mg/24hr patch Place 1  patch (0.1 mg total) onto the skin once a week. 07/05/21   Raulkar, Sven SQUIBB, MD  clopidogrel  (PLAVIX ) 75 MG tablet Take by mouth. Patient not taking: Reported on 12/30/2022 10/29/20   [provider]  famotidine  (PEPCID ) 20 MG tablet Take 20 mg by mouth 2 (two) times daily. 03/16/22   [provider]  FLUoxetine (PROZAC) 10 MG capsule Take 10 mg by mouth daily. Patient not taking: Reported on 12/30/2022 03/10/22   [provider]  FLUoxetine (PROZAC) 20 MG capsule Take 20 mg by mouth daily. 03/16/22   [provider]  furosemide  (LASIX ) 20 MG tablet Take 20 mg by mouth 2 (two) times daily.    [provider]  gabapentin  (NEURONTIN ) 400 MG capsule Take by mouth. Patient not taking: Reported on 12/30/2022 10/29/20   [provider]  isosorbide  dinitrate (ISORDIL ) 30 MG tablet Take 30 mg by mouth daily. 03/16/22   [provider]  isosorbide  mononitrate (IMDUR ) 30 MG 24 hr tablet Take 1 tablet (30 mg total) by mouth once daily. Patient not taking: Reported on 12/30/2022 07/14/21     loratadine (CLARITIN) 10 MG tablet Take 10 mg by mouth daily.    [provider]  losartan  (COZAAR ) 25 MG tablet Take by mouth. Patient not taking: Reported on 12/30/2022 11/18/20   [provider]  losartan  (COZAAR ) 50 MG tablet Take 1 tablet (50 mg total) by mouth in the morning and at bedtime. 07/05/21   Raulkar, Sven SQUIBB,  MD  Melatonin 3 MG CAPS Take 3 mg by mouth at bedtime.    [provider]  nitroGLYCERIN  (NITROSTAT ) 0.4 MG SL tablet Place 1 tablet (0.4 mg total) under the tongue every 5 (five) minutes as needed for chest pain. 08/20/21   Daneen Damien BROCKS, NP  phenylephrine  (SUDAFED PE) 10 MG TABS tablet Take by mouth. Patient not taking: Reported on 12/30/2022 01/08/21   [provider]  Tiotropium Bromide-Olodaterol (STIOLTO RESPIMAT) 2.5-2.5 MCG/ACT AERS Inhale into the lungs daily.    [provider]  traZODone   (DESYREL ) 50 MG tablet Take 50 mg by mouth at bedtime. Patient not taking: Reported on 12/30/2022 03/10/22   [provider]  Vitamin D , Ergocalciferol , (DRISDOL ) 1.25 MG (50000 UNIT) CAPS capsule Take by mouth. 08/17/20   [provider]    Allergies: Gabapentin , Tetanus toxoids, Aspirin, and Garlic    Review of Systems  Respiratory:  Positive for chest tightness.   Cardiovascular:  Positive for chest pain (tightness).  All other systems reviewed and are negative.   Updated Vital Signs BP 137/81   Pulse (!) 59   Temp 98.9 F (37.2 C) (Oral)   Resp 18   Ht 5' 8 (1.727 m)   Wt 88.5 kg   SpO2 96%   BMI 29.65 kg/m   Physical Exam Vitals and nursing note reviewed.  Constitutional:      Appearance: He is well-developed.  HENT:     Head: Normocephalic and atraumatic.     Nose:     Comments: Breath right strip on nose Eyes:     Conjunctiva/sclera: Conjunctivae normal.     Pupils: Pupils are equal, round, and reactive to light.  Cardiovascular:     Rate and Rhythm: Normal rate and regular rhythm.     Heart sounds: Normal heart sounds.  Pulmonary:     Effort: Pulmonary effort is normal.     Breath sounds: Normal breath sounds. No wheezing or rhonchi.     Comments: Lung sounds grossly clear without wheezes or rhonchi, able to speak in full sentences without difficulty, sats 97-100% throughout exam Abdominal:     General: Bowel sounds are normal.     Palpations: Abdomen is soft.  Musculoskeletal:        General: Normal range of motion.     Cervical back: Normal range of motion.     Comments: Left AKA  Skin:    General: Skin is warm and dry.  Neurological:     Mental Status: He is alert and oriented to person, place, and time.     (all labs ordered are listed, but only abnormal results are displayed) Labs Reviewed  BASIC METABOLIC PANEL WITH GFR - Abnormal; Notable for the following components:      Result Value   Glucose, Bld 108 (*)    Creatinine,  Ser 1.25 (*)    All other components within normal limits  CBC - Abnormal; Notable for the following components:   MCV 79.9 (*)    RDW 15.8 (*)    All other components within normal limits  TROPONIN I (HIGH SENSITIVITY)  TROPONIN I (HIGH SENSITIVITY)    EKG: None  Radiology: DG Chest 2 View Result Date: 09/12/2023 CLINICAL DATA:  Racing heart and chest pain. EXAM: CHEST - 2 VIEW COMPARISON:  December 14, 2022 FINDINGS: The heart size and mediastinal contours are within normal limits. There is mild calcification of the aortic arch. There is evidence of emphysematous lung disease. Mild linear atelectasis  is seen within the left lung base. No pleural effusion or pneumothorax is identified. The visualized skeletal structures are unremarkable. IMPRESSION: No active cardiopulmonary disease. Electronically Signed   By: Suzen Dials M.D.   On: 09/12/2023 01:21     Procedures   Medications Ordered in the ED  ipratropium-albuterol  (DUONEB) 0.5-2.5 (3) MG/3ML nebulizer solution 3 mL (3 mLs Nebulization Given 09/12/23 0316)                                    Medical Decision Making Amount and/or Complexity of Data Reviewed Labs: ordered. Radiology: ordered and independent interpretation performed. ECG/medicine tests: ordered and independent interpretation performed.  Risk Prescription drug management.   63 year old male presenting to the ED for chest tightness.  States he feels like his blood pressure spiked and was not breathing as well as normal.  He denies any frank chest pain or shortness of breath currently.  He is afebrile and nontoxic in appearance here.  He is hemodynamically stable on room air.  His lung sounds are clear without any wheezes or rhonchi.  He is asking for breathing treatment here.  Will give DuoNeb while awaiting workup.  EKG is nonischemic.  Labs as above--no leukocytosis or significant electrolyte derangement.  Troponin negative x 2.  Chest x-ray is clear.   He reports he is willing better after DuoNeb here.  He feels like it may have been a slight flareup of his COPD.  He remains hemodynamically stable here on room air without any signs of respiratory distress.  I do feel he is stable for discharge.  Requested refill of inhaler, this is been sent to pharmacy.  Encouraged to follow-up closely with PCP.  Can return here for any new or acute changes.  Final diagnoses:  Chest tightness    ED Discharge Orders          Ordered    albuterol  (VENTOLIN  HFA) 108 (90 Base) MCG/ACT inhaler  Every 6 hours PRN        09/12/23 0416               Jarold Olam HERO, PA-C 09/12/23 9490    Theadore Ozell HERO, MD 09/12/23 [phone]

## 2023-09-12 NOTE — ED Triage Notes (Addendum)
 Pt reports HTN meds not working right and started feeling like heart racing tonight. Denies SOB or chest pain specifically. Pt states he thinks he needs breathing tx. Belly looks distended but pt reports this is baseline. Reports occasional edema in R foot but denies that today.  Pt is left leg AKA.

## 2023-09-12 NOTE — Discharge Instructions (Addendum)
 Your work-up today was reassuring. Can continue using inhalers when needed. Follow-up with your doctor. Return here for new concerns.

## 2023-12-19 NOTE — Progress Notes (Signed)
 " Primary Care Physician: Glinda ONEIDA Like, NP  Reason for visit: Coronary artery disease, palpitation  HPI:  This is pleasant 63 y.o. yrs old male with history of coronary artery disease, peripheral artery disease, hypertension, hyperlipidemia, COPD is here for cardiac evaluation.  Patient used to follow with [facility] health cardiology in the past.  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  He is currently on aspirin/Plavix .  His right ABI was noted to be 0.83.  He underwent coronary CTA 09/14/2021 which showed ostial LAD 25-49%, left main 25-49%, mid LAD 25-49%, circumflex 50-69%, distal circumflex 70-99%, RCA with mixed plaque 50-69% proximal.  He had a coronary calcium  score of 844 which places him in the 94th percentile.  Coronary artery disease has been managed medically.  No resting or exertional angina is reported.  He does have chronically abnormal EKG with nonspecific T wave abnormality. Current blood pressure regimen include Coreg /losartan /clonidine /Imdur .  He is on a statin, tolerating well.  No recent lipid panel.  He is not sure if he is taking amlodipine  or not. He has been having increased episode of palpitation lately.  He says he had chronic palpitation but lately has been feeling skipped heartbeat and tachycardia at least 2-3 times in a day.  He recalls having Zio patch few months ago, was told that he possibly has ventricular tachycardia.  I could not get detailed report.  He would like to have further evaluation for palpitation.  He does have chronic COPD at baseline. Limited in terms of aerobic exercise due to COPD and limb amputation.  He performs upper extremity exercises and weightlifting on regular basis. He used to smoke remotely. He says he follows up with vascular team at [facility] health.  He lives with his sister.   CT coronary  angiogram 09/14/2021. 1. Coronary artery calcium  score 844 Agatston units. This places the  patient in the 94th percentile for age and gender, suggesting high  risk for future cardiac events.   2. Extensive plaque in the proximal and mid LAD, but suspect < 50%  stenosis at any given point.   3. Nondominant RCA, probably moderate stenosis in the proximal  vessel.   4. Extensive LCx disease, suspect moderate stenosis in the proximal  LCx, suspect severe stenosis in the distal LCx leading into the left  PDA.   Echocardiogram 07/27/2016 - Left ventricle: The cavity size was mildly dilated. Wall    thickness was normal. Systolic function was mildly reduced. The    estimated ejection fraction was in the range of 45% to 50%.    Diffuse hypokinesis. Left ventricular diastolic function    parameters were normal.  - Atrial septum: No defect or patent foramen ovale was identified.   Allergies: Aspirin, Gabapentin , Garlic, and Tetanus toxoids  Past Medical History: Past Medical History:  Diagnosis Date   Hyperlipidemia    Hypertension    Past Surgical History:  Procedure Laterality Date   Leg amputation at hip Left 2020   Family History: Family History  Problem Relation Age of Onset   Stroke Father    Social History: Social History[1]  Medication list:     Medication Sig Dispense Refill   albuterol  sulfate HFA (PROVENTIL ,VENTOLIN ,PROAIR ) 108 (90 Base) MCG/ACT inhaler SMARTSIG:Via Inhaler     aspirin (ECOTRIN LOW DOSE) EC tablet Take one tablet (81 mg dose) by mouth daily.     atorvastatin  (  LIPITOR ) 40 mg tablet 1 tablet Orally every evening; Duration: 90 days for hyperlipidemia     carvedilol  (COREG ) 12.5 mg tablet 1 tablet with food Orally Twice a day; Duration: 90 days Blood pressure     cloNIDine  (CATAPRES ) 0.2 MG/24HR one patch once a week at 0900.     clopidogrel  bisulfate (PLAVIX ) 75 mg tablet Take one tablet (75 mg dose) by mouth daily.     ergocalciferol   50,000 units CAPS capsule one capsule (50,000 Units dose).     famotidine  (PEPCID ) 20 mg tablet 1 tablet at bedtime Oral Once a day; Duration: 90 days     fluticasone propionate (FLONASE) 50 mcg/actuation nasal spray 2 spray in each nostril Nasally once daily; Duration: 30 days seasonal allergies     ipratropium-albuterol  (DUONEB) 0.5-2.5 mg/3 mL ML SOLN nebulizer solution SMARTSIG:3 Milliliter(s) Every 6 Hours PRN     isosorbide  dinitrate (ISORDIL ) 30 MG tablet 1 tablet Oral once daily; Duration: 90 days for blood pressure     loratadine (CLARITIN) 10 MG tablet TAKE 1 TABLET BY MOUTH DAILY; Duration: 28     losartan  potassium (COZAAR ) 25 mg tablet Take one tablet (25 mg dose) by mouth daily.     phenylephrine  (SUDAFED PE) 10 MG TABS Take one tablet (10 mg dose) by mouth every 4 (four) hours as needed.     STIOLTO RESPIMAT 2.5-2.5 MCG/ACT inhaler 2 puffs Inhalation Once a day; Duration: 90 days COPD     No current facility-administered medications for this visit.    ROS:The patient denies nausea, vomiting, diarrhea, fever, chills, or bleeding. All other systems are reviewed and are negative except for that mentioned in the history of present illness.  Vital Signs: BP 138/80 (BP Location: Left Upper Arm, Patient Position: Sitting)   Pulse 80   Wt 200 lb 14.4 oz (91.1 kg)   SpO2 93%  Constitutional: Well-nourished well-developed ,  no acute distress.  HENT:normocephalic,atraumatic,oropharynx moist,no oral exudates.   Neck: Supple. No JVD. Carotid bruits: Absent bilaterally. Eyes: Conjunctiva normal, no discharge. Lymphatic:no lymphadenopathy noted. Cardiovascular: RRR. No murmur, No gallop. No rub. Respiratory: Mildly decreased breath sounds bilaterally.  No wheezes or crackles noted GI: Soft, nontender, nondistended, with normal bowel sounds, no masses or hepatosplenomegaly.  Skin: Warm, dry, no erythema, no rash. Musculoskeletal: No edema, no tenderness, no cyanosis, no  clubbing. Pulses: 1+ and intact bilaterally Neurological:  Cranial nerves are intact with no focal deficits. Psychiatric: Alert and oriented to person, place and time, normal affect.  IMPRESSION AND PLAN    1. Palpitations   2. Coronary artery disease involving native coronary artery of native heart without angina pectoris   3. PVD (peripheral vascular disease)   4. Hypertension, unspecified type   5. Hyperlipidemia, unspecified hyperlipidemia type   6. NSVT (nonsustained ventricular tachycardia) (*)    Plan:  For evaluation of worsening palpitation, will consider monitor for 2 weeks. He is not sure about his medical regimen.   There is a discrepancy between what he believes he is taking versus what he says in the chart.  Will need to reconcile medication.  He will call us  after he confirms what he takes at home.  Current regimen include aspirin/Plavix /Lipitor /Coreg /Imdur /clonidine /losartan .  Blood pressure well-controlled.  Goal LDL should be less than 70 mg/dL.  Will request lipid panel primary care team.  I will try to get report of Zio patch from earlier this year. Given finding of NSVT(reported at referral note), I will increase dose of Coreg  to 25 mg  twice daily.  He does have chronic coronary artery disease as suggested by CT coronary angiogram in 2023, medically managed, free of angina.  EKG is chronically abnormal with nonspecific T wave changes.  Pending echocardiogram and monitor finding, might have to consider cardiac catheterization at some point.  Patient would like to hold invasive evaluation for now.  Most recent echocardiogram was from 2018, EF of 25 to 50%.  Clinically, he is not in heart failure.  I will get updated echocardiogram. He does have extensive history of peripheral artery disease.  He is status post left above-knee amputation.  He follows with vascular team at [facility]  We had discussion about importance of life style modification, keeping BP, blood sugar and  cholesterol, body weight at goal, role of regular exercise (at least 30 minutes of moderate intensity aerobic exercise 5 days in a week), following low fat, low carb, low salt heart healthy diet, abstinence from smoking and drinking etc.  We also discussed about visiting PCP visit for non cardiac issues, importance of regular follow up, health screening  and medication compliance.   Follow up in about 4 months (around 04/20/2024).   Marcello MARLA Lennox, MD   Note: This documented was generated using voice recognition software. There may be unintended transcription errors that were not detected upon document review.            [1] Social History Socioeconomic History   Marital status: Single  Tobacco Use   Smoking status: Never   Smokeless tobacco: Never  Vaping Use   Vaping status: Never Used  Substance and Sexual Activity   Alcohol use: Never   Drug use: Never  "

## 2024-02-26 NOTE — Telephone Encounter (Signed)
 Called patient.  Confirmed DOB.  Relayed Echocardiogram Results & Holter Monitor  results to patient per Dr.Badal's note.  Pt verbalized understanding.
